# Patient Record
Sex: Female | Born: 1955 | Race: Black or African American | Hispanic: No | Marital: Married | Smoking: Former smoker
Health system: Southern US, Community
[De-identification: ages and names within clinical notes are randomized; demographics above are authoritative.]

## PROBLEM LIST (undated history)

## (undated) DIAGNOSIS — K56609 Unspecified intestinal obstruction, unspecified as to partial versus complete obstruction: Secondary | ICD-10-CM

## (undated) DIAGNOSIS — E785 Hyperlipidemia, unspecified: Secondary | ICD-10-CM

## (undated) DIAGNOSIS — R269 Unspecified abnormalities of gait and mobility: Secondary | ICD-10-CM

## (undated) DIAGNOSIS — R5383 Other fatigue: Secondary | ICD-10-CM

## (undated) DIAGNOSIS — M2550 Pain in unspecified joint: Secondary | ICD-10-CM

## (undated) DIAGNOSIS — K589 Irritable bowel syndrome without diarrhea: Secondary | ICD-10-CM

## (undated) DIAGNOSIS — Z9889 Other specified postprocedural states: Secondary | ICD-10-CM

## (undated) DIAGNOSIS — J45909 Unspecified asthma, uncomplicated: Secondary | ICD-10-CM

## (undated) DIAGNOSIS — E119 Type 2 diabetes mellitus without complications: Secondary | ICD-10-CM

## (undated) DIAGNOSIS — K5792 Diverticulitis of intestine, part unspecified, without perforation or abscess without bleeding: Secondary | ICD-10-CM

## (undated) DIAGNOSIS — G35 Multiple sclerosis: Secondary | ICD-10-CM

## (undated) DIAGNOSIS — E0590 Thyrotoxicosis, unspecified without thyrotoxic crisis or storm: Secondary | ICD-10-CM

## (undated) DIAGNOSIS — G250 Essential tremor: Secondary | ICD-10-CM

## (undated) DIAGNOSIS — K279 Peptic ulcer, site unspecified, unspecified as acute or chronic, without hemorrhage or perforation: Secondary | ICD-10-CM

## (undated) DIAGNOSIS — H539 Unspecified visual disturbance: Secondary | ICD-10-CM

## (undated) DIAGNOSIS — M3500 Sicca syndrome, unspecified: Secondary | ICD-10-CM

## (undated) DIAGNOSIS — R200 Anesthesia of skin: Secondary | ICD-10-CM

## (undated) DIAGNOSIS — E039 Hypothyroidism, unspecified: Secondary | ICD-10-CM

## (undated) DIAGNOSIS — R3911 Hesitancy of micturition: Secondary | ICD-10-CM

## (undated) DIAGNOSIS — K8020 Calculus of gallbladder without cholecystitis without obstruction: Secondary | ICD-10-CM

## (undated) DIAGNOSIS — M1990 Unspecified osteoarthritis, unspecified site: Secondary | ICD-10-CM

## (undated) DIAGNOSIS — J189 Pneumonia, unspecified organism: Secondary | ICD-10-CM

## (undated) DIAGNOSIS — I10 Essential (primary) hypertension: Secondary | ICD-10-CM

## (undated) DIAGNOSIS — M797 Fibromyalgia: Secondary | ICD-10-CM

## (undated) DIAGNOSIS — G4710 Hypersomnia, unspecified: Secondary | ICD-10-CM

## (undated) DIAGNOSIS — R112 Nausea with vomiting, unspecified: Secondary | ICD-10-CM

## (undated) DIAGNOSIS — G4700 Insomnia, unspecified: Secondary | ICD-10-CM

## (undated) DIAGNOSIS — F418 Other specified anxiety disorders: Secondary | ICD-10-CM

## (undated) DIAGNOSIS — R9431 Abnormal electrocardiogram [ECG] [EKG]: Secondary | ICD-10-CM

## (undated) DIAGNOSIS — I499 Cardiac arrhythmia, unspecified: Secondary | ICD-10-CM

## (undated) DIAGNOSIS — K219 Gastro-esophageal reflux disease without esophagitis: Secondary | ICD-10-CM

## (undated) DIAGNOSIS — G43909 Migraine, unspecified, not intractable, without status migrainosus: Secondary | ICD-10-CM

## (undated) HISTORY — DX: Hyperlipidemia, unspecified: E78.5

## (undated) HISTORY — PX: MENISCUS REPAIR: SHX5179

## (undated) HISTORY — PX: CARDIAC CATHETERIZATION: SHX172

## (undated) HISTORY — PX: SMALL INTESTINE SURGERY: SHX150

## (undated) HISTORY — DX: Gastro-esophageal reflux disease without esophagitis: K21.9

## (undated) HISTORY — DX: Unspecified osteoarthritis, unspecified site: M19.90

## (undated) HISTORY — DX: Sjogren syndrome, unspecified: M35.00

## (undated) HISTORY — DX: Abnormal electrocardiogram (ECG) (EKG): R94.31

## (undated) HISTORY — PX: CHOLECYSTECTOMY: SHX55

## (undated) HISTORY — DX: Unspecified visual disturbance: H53.9

## (undated) HISTORY — DX: Peptic ulcer, site unspecified, unspecified as acute or chronic, without hemorrhage or perforation: K27.9

## (undated) HISTORY — DX: Essential tremor: G25.0

## (undated) HISTORY — DX: Migraine, unspecified, not intractable, without status migrainosus: G43.909

## (undated) HISTORY — DX: Hypothyroidism, unspecified: E03.9

## (undated) HISTORY — DX: Irritable bowel syndrome, unspecified: K58.9

## (undated) HISTORY — PX: HERNIA REPAIR: SHX51

## (undated) HISTORY — DX: Unspecified asthma, uncomplicated: J45.909

## (undated) HISTORY — DX: Pneumonia, unspecified organism: J18.9

## (undated) HISTORY — DX: Calculus of gallbladder without cholecystitis without obstruction: K80.20

## (undated) HISTORY — DX: Other specified anxiety disorders: F41.8

## (undated) HISTORY — DX: Thyrotoxicosis, unspecified without thyrotoxic crisis or storm: E05.90

## (undated) HISTORY — DX: Hesitancy of micturition: R39.11

## (undated) HISTORY — DX: Unspecified abnormalities of gait and mobility: R26.9

## (undated) HISTORY — PX: COLON SURGERY: SHX602

## (undated) HISTORY — DX: Hypersomnia, unspecified: G47.10

## (undated) HISTORY — DX: Pain in unspecified joint: M25.50

## (undated) HISTORY — DX: Insomnia, unspecified: G47.00

## (undated) HISTORY — DX: Anesthesia of skin: R20.0

## (undated) HISTORY — DX: Type 2 diabetes mellitus without complications: E11.9

## (undated) HISTORY — DX: Other fatigue: R53.83

## (undated) HISTORY — DX: Diverticulitis of intestine, part unspecified, without perforation or abscess without bleeding: K57.92

## (undated) HISTORY — DX: Unspecified intestinal obstruction, unspecified as to partial versus complete obstruction: K56.609

## (undated) HISTORY — PX: CARPAL TUNNEL RELEASE: SHX101

---
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
## 1974-03-03 HISTORY — PX: ABDOMINAL HYSTERECTOMY: SHX81

## 2011-09-01 DIAGNOSIS — G35 Multiple sclerosis: Secondary | ICD-10-CM | POA: Insufficient documentation

## 2011-09-01 DIAGNOSIS — E119 Type 2 diabetes mellitus without complications: Secondary | ICD-10-CM | POA: Insufficient documentation

## 2011-09-01 DIAGNOSIS — G20 Parkinson's disease: Secondary | ICD-10-CM | POA: Insufficient documentation

## 2011-09-01 DIAGNOSIS — I2510 Atherosclerotic heart disease of native coronary artery without angina pectoris: Secondary | ICD-10-CM | POA: Insufficient documentation

## 2011-09-01 DIAGNOSIS — G8929 Other chronic pain: Secondary | ICD-10-CM | POA: Insufficient documentation

## 2011-09-01 DIAGNOSIS — I10 Essential (primary) hypertension: Secondary | ICD-10-CM | POA: Insufficient documentation

## 2013-08-23 DIAGNOSIS — R110 Nausea: Secondary | ICD-10-CM | POA: Diagnosis present

## 2013-08-23 DIAGNOSIS — R509 Fever, unspecified: Secondary | ICD-10-CM | POA: Diagnosis not present

## 2013-08-23 DIAGNOSIS — I10 Essential (primary) hypertension: Secondary | ICD-10-CM | POA: Diagnosis present

## 2013-08-23 DIAGNOSIS — E872 Acidosis, unspecified: Secondary | ICD-10-CM | POA: Diagnosis present

## 2013-08-23 DIAGNOSIS — K219 Gastro-esophageal reflux disease without esophagitis: Secondary | ICD-10-CM | POA: Diagnosis present

## 2013-08-23 DIAGNOSIS — G35 Multiple sclerosis: Secondary | ICD-10-CM | POA: Diagnosis present

## 2013-08-23 DIAGNOSIS — R6510 Systemic inflammatory response syndrome (SIRS) of non-infectious origin without acute organ dysfunction: Secondary | ICD-10-CM | POA: Diagnosis not present

## 2013-08-23 DIAGNOSIS — E860 Dehydration: Secondary | ICD-10-CM | POA: Diagnosis present

## 2013-08-23 DIAGNOSIS — F411 Generalized anxiety disorder: Secondary | ICD-10-CM | POA: Diagnosis present

## 2013-08-23 DIAGNOSIS — D696 Thrombocytopenia, unspecified: Secondary | ICD-10-CM | POA: Diagnosis present

## 2013-08-23 DIAGNOSIS — IMO0001 Reserved for inherently not codable concepts without codable children: Secondary | ICD-10-CM | POA: Diagnosis not present

## 2013-08-23 DIAGNOSIS — R7401 Elevation of levels of liver transaminase levels: Secondary | ICD-10-CM | POA: Diagnosis present

## 2013-08-23 DIAGNOSIS — R21 Rash and other nonspecific skin eruption: Secondary | ICD-10-CM | POA: Diagnosis present

## 2013-08-23 DIAGNOSIS — Z794 Long term (current) use of insulin: Secondary | ICD-10-CM | POA: Diagnosis not present

## 2013-08-23 DIAGNOSIS — Z8711 Personal history of peptic ulcer disease: Secondary | ICD-10-CM | POA: Diagnosis not present

## 2013-08-23 DIAGNOSIS — R51 Headache: Secondary | ICD-10-CM | POA: Diagnosis present

## 2014-10-10 DIAGNOSIS — H6123 Impacted cerumen, bilateral: Secondary | ICD-10-CM | POA: Diagnosis not present

## 2014-10-10 DIAGNOSIS — F419 Anxiety disorder, unspecified: Secondary | ICD-10-CM | POA: Diagnosis not present

## 2014-10-10 DIAGNOSIS — D519 Vitamin B12 deficiency anemia, unspecified: Secondary | ICD-10-CM | POA: Diagnosis not present

## 2014-10-12 DIAGNOSIS — Z23 Encounter for immunization: Secondary | ICD-10-CM | POA: Diagnosis not present

## 2014-10-22 DIAGNOSIS — L02214 Cutaneous abscess of groin: Secondary | ICD-10-CM | POA: Diagnosis not present

## 2015-01-09 DIAGNOSIS — G35 Multiple sclerosis: Secondary | ICD-10-CM | POA: Diagnosis not present

## 2015-01-09 DIAGNOSIS — F338 Other recurrent depressive disorders: Secondary | ICD-10-CM | POA: Diagnosis not present

## 2015-01-09 DIAGNOSIS — M797 Fibromyalgia: Secondary | ICD-10-CM | POA: Diagnosis not present

## 2015-01-09 DIAGNOSIS — E119 Type 2 diabetes mellitus without complications: Secondary | ICD-10-CM | POA: Diagnosis not present

## 2015-01-09 DIAGNOSIS — E083393 Diabetes mellitus due to underlying condition with moderate nonproliferative diabetic retinopathy without macular edema, bilateral: Secondary | ICD-10-CM | POA: Diagnosis not present

## 2015-01-09 DIAGNOSIS — L630 Alopecia (capitis) totalis: Secondary | ICD-10-CM | POA: Diagnosis not present

## 2015-01-09 DIAGNOSIS — I10 Essential (primary) hypertension: Secondary | ICD-10-CM | POA: Diagnosis not present

## 2015-01-09 DIAGNOSIS — F064 Anxiety disorder due to known physiological condition: Secondary | ICD-10-CM | POA: Diagnosis not present

## 2015-01-17 DIAGNOSIS — I10 Essential (primary) hypertension: Secondary | ICD-10-CM | POA: Diagnosis not present

## 2015-01-17 DIAGNOSIS — M797 Fibromyalgia: Secondary | ICD-10-CM | POA: Diagnosis not present

## 2015-01-17 DIAGNOSIS — G35 Multiple sclerosis: Secondary | ICD-10-CM | POA: Diagnosis not present

## 2015-01-17 DIAGNOSIS — F338 Other recurrent depressive disorders: Secondary | ICD-10-CM | POA: Diagnosis not present

## 2015-02-01 ENCOUNTER — Emergency Department (HOSPITAL_COMMUNITY)
Admission: EM | Admit: 2015-02-01 | Discharge: 2015-02-01 | Disposition: A | Discharge status: Home / Self Care | Payer: Self-pay

## 2015-02-01 NOTE — ED Notes (Signed)
Pt checked in, asked if the wait time was accurate that was displayed on the screen. When told time was accurate, pt stated that she did not want to wait that long. Pt then left. Pt moved to off the floor at this time.

## 2015-02-03 ENCOUNTER — Encounter (HOSPITAL_COMMUNITY): Payer: Self-pay | Admitting: Emergency Medicine

## 2015-02-03 ENCOUNTER — Emergency Department (HOSPITAL_COMMUNITY)
Admission: EM | Admit: 2015-02-03 | Discharge: 2015-02-03 | Disposition: A | Discharge status: Home / Self Care | Payer: Medicare Other | Attending: Physician Assistant | Admitting: Physician Assistant

## 2015-02-03 DIAGNOSIS — E119 Type 2 diabetes mellitus without complications: Secondary | ICD-10-CM | POA: Diagnosis not present

## 2015-02-03 DIAGNOSIS — Z8669 Personal history of other diseases of the nervous system and sense organs: Secondary | ICD-10-CM

## 2015-02-03 DIAGNOSIS — R202 Paresthesia of skin: Secondary | ICD-10-CM | POA: Diagnosis not present

## 2015-02-03 DIAGNOSIS — I10 Essential (primary) hypertension: Secondary | ICD-10-CM | POA: Insufficient documentation

## 2015-02-03 DIAGNOSIS — G8929 Other chronic pain: Secondary | ICD-10-CM | POA: Insufficient documentation

## 2015-02-03 DIAGNOSIS — R531 Weakness: Secondary | ICD-10-CM | POA: Insufficient documentation

## 2015-02-03 DIAGNOSIS — H538 Other visual disturbances: Secondary | ICD-10-CM | POA: Diagnosis not present

## 2015-02-03 DIAGNOSIS — Z79899 Other long term (current) drug therapy: Secondary | ICD-10-CM | POA: Diagnosis not present

## 2015-02-03 DIAGNOSIS — Z794 Long term (current) use of insulin: Secondary | ICD-10-CM | POA: Diagnosis not present

## 2015-02-03 DIAGNOSIS — M545 Low back pain: Secondary | ICD-10-CM

## 2015-02-03 DIAGNOSIS — R200 Anesthesia of skin: Secondary | ICD-10-CM | POA: Diagnosis not present

## 2015-02-03 DIAGNOSIS — R51 Headache: Secondary | ICD-10-CM | POA: Insufficient documentation

## 2015-02-03 DIAGNOSIS — G35 Multiple sclerosis: Secondary | ICD-10-CM

## 2015-02-03 HISTORY — DX: Fibromyalgia: M79.7

## 2015-02-03 HISTORY — DX: Essential (primary) hypertension: I10

## 2015-02-03 HISTORY — DX: Multiple sclerosis: G35

## 2015-02-03 LAB — BASIC METABOLIC PANEL
ANION GAP: 8 (ref 5–15)
BUN: 13 mg/dL (ref 6–20)
CALCIUM: 9.4 mg/dL (ref 8.9–10.3)
CHLORIDE: 105 mmol/L (ref 101–111)
CO2: 27 mmol/L (ref 22–32)
Creatinine, Ser: 0.97 mg/dL (ref 0.44–1.00)
GFR calc non Af Amer: >60 mL/min (ref >60)
GLUCOSE: 117 mg/dL — AB (ref 65–99)
POTASSIUM: 4.1 mmol/L (ref 3.5–5.1)
Sodium: 140 mmol/L (ref 135–145)

## 2015-02-03 LAB — CBC WITH DIFFERENTIAL/PLATELET
BASOS ABS: 0 10*3/uL (ref 0.0–0.1)
BASOS PCT: 0 %
Eosinophils Absolute: 0 10*3/uL (ref 0.0–0.7)
Eosinophils Relative: 0 %
HEMATOCRIT: 35.8 % — AB (ref 36.0–46.0)
HEMOGLOBIN: 11.9 g/dL — AB (ref 12.0–15.0)
LYMPHS PCT: 56 %
Lymphs Abs: 2.4 10*3/uL (ref 0.7–4.0)
MCH: 28.6 pg (ref 26.0–34.0)
MCHC: 33.2 g/dL (ref 30.0–36.0)
MCV: 86.1 fL (ref 78.0–100.0)
MONO ABS: 0.3 10*3/uL (ref 0.1–1.0)
Monocytes Relative: 8 %
NEUTROS ABS: 1.5 10*3/uL — AB (ref 1.7–7.7)
NEUTROS PCT: 36 %
Platelets: 147 10*3/uL — ABNORMAL LOW (ref 150–400)
RBC: 4.16 MIL/uL (ref 3.87–5.11)
RDW: 12.3 % (ref 11.5–15.5)
WBC: 4.2 10*3/uL (ref 4.0–10.5)

## 2015-02-03 MED ORDER — HYDROMORPHONE HCL 1 MG/ML IJ SOLN: 1.0000 mg | Freq: Once | INTRAMUSCULAR | Status: DC

## 2015-02-03 MED ORDER — OXYCODONE-ACETAMINOPHEN 5-325 MG PO TABS: 2.0000 | ORAL_TABLET | ORAL | Status: DC | PRN

## 2015-02-03 MED ORDER — HYDROMORPHONE HCL 1 MG/ML IJ SOLN
1.0000 mg | Freq: Once | INTRAMUSCULAR | Status: AC
Start: 2015-02-03 — End: 2015-02-03
  Administered 2015-02-03: 1 mg via INTRAMUSCULAR
  Filled 2015-02-03: qty 1

## 2015-02-03 NOTE — ED Notes (Signed)
Moved to room 34, report to [place].

## 2015-02-03 NOTE — ED Notes (Signed)
LBP x 2 weeks

## 2015-02-03 NOTE — Discharge Instructions (Signed)
Take your medications as prescribed as needed for pain relief. Follow up with neurology at your scheduled appointment next week. Please return to the Emergency Department if symptoms worsen or new onset of fever, neck stiffness, groin numbness, weakness, abdominal pain, vomiting, loss control of bowel or bladder.

## 2015-02-03 NOTE — ED Provider Notes (Signed)
CSN: [id number]     Arrival date & time 02/03/15  1032 History   First MD Initiated Contact with Patient 02/03/15 1058     Chief Complaint  Patient presents with  . Back Pain     (Consider location/radiation/quality/duration/timing/severity/associated sxs/prior Treatment) HPI   Patient is a 59 year old female with past medical history of MS, hypertension, fibromyalgia and diabetes who presents to the ED with complaint of worsening back pain. Patient reports having chronic back pain associated with her MS. She notes she recently moved from [place] and has an appointment scheduled with a neurologist on 12/9. Patient reports having sharp intermittent pain that starts in her lower back and radiates bilaterally down both posterior thighs and also notes the pain radiates superiorly up both sides of her back, pain aggravated with movement. She notes she has been taking Vicodin at home with no relief. Patient reports pain is consistent with back pain she has had in the past related to her MS but notes that it has worsened over the past few months. Endorses associated numbness and tingling to her entire body. She notes having intermittent episodes of burning sensation to her lower legs. Patient also reports she has been much weaker than baseline and reports having multiple mechanical falls over the past few months, denies head injury or LOC. Pt denies fever, saddle anesthesia, loss of bowel or bladder, urinary sxs, IVDU, cancer or recent spinal manipulation. Patient reports she was on Avonex however due to side effects she stopped taking the medication approximately one week ago and notes she feels like her symptoms have worsened since stopping the medication.  Past Medical History  Diagnosis Date  . MS (multiple sclerosis) ([place])   . Fibromyalgia   . Diabetes mellitus without complication ([place])   . Hypertension    Past Surgical History  Procedure Laterality Date  . Colon surgery    .  Cholecystectomy    . Abdominal hysterectomy    . Ctr    . Knee surgery    . Appendectomy     No family history on file. Social History  Substance Use Topics  . Smoking status: Never Smoker   . Smokeless tobacco: None  . Alcohol Use: No   OB History    No data available     Review of Systems  Eyes: Positive for visual disturbance. Photophobia: blurred vision.  Musculoskeletal: Positive for myalgias and back pain.  Neurological: Positive for weakness, numbness and headaches.  All other systems reviewed and are negative.     Allergies  Prednisone; Solu-medrol; Bactrim; and Biaxin  Home Medications   Prior to Admission medications   Medication Sig Start Date End Date Taking? Authorizing Provider  alprazolam Duanne Moron) 2 MG tablet Take 2 mg by mouth 3 (three) times daily.   Yes Historical Provider, MD  Cyanocobalamin (VITAMIN B-12 IJ) Inject 1 mL as directed every 14 (fourteen) days.   Yes Historical Provider, MD  HYDROcodone-acetaminophen (NORCO) 10-325 MG tablet Take 1 tablet by mouth every 6 (six) hours.   Yes Historical Provider, MD  insulin aspart protamine- aspart (NOVOLOG MIX 70/30) (70-30) 100 UNIT/ML injection Inject 26-30 Units into the skin See admin instructions. 26 units in am, 30 units in pm   Yes Historical Provider, MD  lidocaine (XYLOCAINE) 5 % ointment Apply 1 application topically daily as needed for mild pain.   Yes Historical Provider, MD  losartan (COZAAR) 50 MG tablet Take 50 mg by mouth daily.   Yes Historical Provider, MD  pantoprazole (PROTONIX) 40 MG tablet Take 40 mg by mouth daily.   Yes Historical Provider, MD  oxyCODONE-acetaminophen (PERCOCET/ROXICET) 5-325 MG tablet Take 2 tablets by mouth every 4 (four) hours as needed for severe pain. 02/03/15   Chesley Noon [place], PA-C   BP 137/88 mmHg  Pulse 88  Temp(Src) 98.2 F (36.8 C) (Oral)  Resp 18  Ht 5\' 3"  (1.6 m)  Wt 75.796 kg  BMI 29.61 kg/m2  SpO2 100% Physical Exam  Constitutional: She  is oriented to person, place, and time. She appears well-developed and well-nourished. No distress.  HENT:  Head: Normocephalic and atraumatic.  Mouth/Throat: Oropharynx is clear and moist. No oropharyngeal exudate.  Eyes: Conjunctivae and EOM are normal. Pupils are equal, round, and reactive to light. Right eye exhibits no discharge. Left eye exhibits no discharge. No scleral icterus.  Neck: Normal range of motion. Neck supple.  Cardiovascular: Normal rate, regular rhythm, normal heart sounds and intact distal pulses.   Pulmonary/Chest: Effort normal and breath sounds normal. No respiratory distress. She has no wheezes. She has no rales. She exhibits no tenderness.  Abdominal: Soft. Bowel sounds are normal. She exhibits no distension and no mass. There is no tenderness. There is no rebound and no guarding.  Genitourinary: Rectum normal. Rectal exam shows anal tone normal.  Musculoskeletal: She exhibits tenderness. She exhibits no edema.  Midline C/T/L spine tenderness to palpation. Bilateral cervical, thoracic and lumbar paraspinal muscles tender with light palpation. Patient reports she is unable to flex her back during exam however patient is able to flex her back in order to sit up in bed without any assistance. Bilateral positive straight leg raise. 5/5 strength of BUE and BLE. 2+ radial and PT pulses bilaterally.    Lymphadenopathy:    She has no cervical adenopathy.  Neurological: She is alert and oriented to person, place, and time. She has normal strength. A sensory deficit (Pt reports decreased sensation to left lower extremity.) is present. No cranial nerve deficit. Coordination normal.  Skin: Skin is warm and dry. She is not diaphoretic.  Nursing note and vitals reviewed.   ED Course  Procedures (including critical care time) Labs Review Labs Reviewed  CBC WITH DIFFERENTIAL/PLATELET - Abnormal; Notable for the following:    Hemoglobin 11.9 (*)    HCT 35.8 (*)    Platelets 147  (*)    Neutro Abs 1.5 (*)    All other components within normal limits  BASIC METABOLIC PANEL - Abnormal; Notable for the following:    Glucose, Bld 117 (*)    All other components within normal limits    Imaging Review No results found. I have personally reviewed and evaluated these images and lab results as part of my medical decision-making.  Filed Vitals:   02/03/15 1415 02/03/15 1515  BP: 142/90 137/88  Pulse: 67 88  Temp:    Resp:       MDM   Final diagnoses:  Bilateral low back pain, with sciatica presence unspecified  Hx of multiple sclerosis    Pt presents with multiple complaints. Reports worsening chronic lower back pain, numbness, tingling, weakness, headache, blurred vision for the past few months. History of MS, patient reports these symptoms are consistent with her MS flares. Endorses recent mechanical falls, denies head injury or LOC. VSS. Exam revealed diffuse tenderness throughout back exam with light palpation. Pt reports she is unable to bend/flex her back however during interview pt is able to sit up in bed without assistance or  evidence of pain. Normal rectal tone. Patient able to urinate in the ED, no evidence of urinary incontinence. Labs unremarkable. I do not suspect cord compression or cauda equina syndrome at this time. I suspect patient's symptoms are likely due to to chronic pain associated with MS. Patient reports she recently moved from [place] but states that she has an appointment scheduled with a neurologist for next week. Plan to discharge patient home with short course of pain meds. Advised patient to follow up with neurology at her scheduled appointment.  Evaluation does not show pathology requring ongoing emergent intervention or admission. Pt is hemodynamically stable and mentating appropriately. Discussed findings/results and plan with patient/guardian, who agrees with plan. All questions answered. Return precautions discussed and outpatient  follow up given.        Chesley Noon [place], [place] 02/03/15 [place], MD 02/04/15 [phone]

## 2015-02-03 NOTE — ED Provider Notes (Signed)
MSE was initiated and I personally evaluated the patient and placed orders (if any) at  11:33 AM on February 03, 2015.  Patient with h/o multiple medical problems including multiple sclerosis, fibromyalgia, small bowel obstruction -- presents with worsening of baseline chronic lower back pain with paresthesias in her lower legs described as burning, hot/cold sensation. She has been much weaker than baseline recently has had multiple falls. She reports urgency with having bowel movements and wonders if this is related to her small bowel obstruction. She recently moved to the area approximately one month ago. She has a PCP who is referring her to multiple specialties for her multiple problems.  Labs ordered, patient to move from pod F to acute ED for further evaluation and workup.  Exam:  Gen NAD; Heart RRR, nml S1,S2, no m/r/g; Lungs CTAB; Abd soft, NT, no rebound or guarding; Neuro patient is unable to lift her legs off the bed against gravity (question effort).  The patient appears stable so that the remainder of the MSE may be completed by another provider.  BP 149/90 mmHg  Pulse 71  Temp(Src) 98.2 F (36.8 C) (Oral)  Resp 18  Ht 5\' 3"  (1.6 m)  Wt 75.796 kg  BMI 29.61 kg/m2  SpO2 100%   Carlisle Cater, PA-C 02/03/15 [place], MD 02/04/15 0830

## 2015-02-03 NOTE — ED Notes (Signed)
Pt to ED for c/o back pain. Hx of MS, IDDM,HTN. Hx of frequent falls. Just moved from [place]. Seeing Dr. Clayburn Pert and has neurology appointment next week. Pt has multiple sources of pain. Screened by PA, ordered to move to acute bed for further evaluation.

## 2015-02-08 DIAGNOSIS — G35 Multiple sclerosis: Secondary | ICD-10-CM | POA: Diagnosis not present

## 2015-02-08 DIAGNOSIS — I10 Essential (primary) hypertension: Secondary | ICD-10-CM | POA: Diagnosis not present

## 2015-02-08 DIAGNOSIS — E118 Type 2 diabetes mellitus with unspecified complications: Secondary | ICD-10-CM | POA: Diagnosis not present

## 2015-02-08 DIAGNOSIS — M797 Fibromyalgia: Secondary | ICD-10-CM | POA: Diagnosis not present

## 2015-02-09 ENCOUNTER — Ambulatory Visit (INDEPENDENT_AMBULATORY_CARE_PROVIDER_SITE_OTHER): Payer: Medicare Other | Admitting: Neurology

## 2015-02-09 ENCOUNTER — Encounter: Payer: Self-pay | Admitting: Neurology

## 2015-02-09 ENCOUNTER — Telehealth: Payer: Self-pay | Admitting: Neurology

## 2015-02-09 VITALS — BP 146/90 | HR 68 | Resp 16 | Ht 62.0 in | Wt 167.0 lb

## 2015-02-09 DIAGNOSIS — R3911 Hesitancy of micturition: Secondary | ICD-10-CM | POA: Diagnosis not present

## 2015-02-09 DIAGNOSIS — M797 Fibromyalgia: Secondary | ICD-10-CM | POA: Diagnosis not present

## 2015-02-09 DIAGNOSIS — F418 Other specified anxiety disorders: Secondary | ICD-10-CM | POA: Insufficient documentation

## 2015-02-09 DIAGNOSIS — R5383 Other fatigue: Secondary | ICD-10-CM | POA: Insufficient documentation

## 2015-02-09 DIAGNOSIS — R200 Anesthesia of skin: Secondary | ICD-10-CM

## 2015-02-09 DIAGNOSIS — G35 Multiple sclerosis: Secondary | ICD-10-CM | POA: Diagnosis not present

## 2015-02-09 DIAGNOSIS — R269 Unspecified abnormalities of gait and mobility: Secondary | ICD-10-CM | POA: Insufficient documentation

## 2015-02-09 DIAGNOSIS — G8929 Other chronic pain: Secondary | ICD-10-CM | POA: Insufficient documentation

## 2015-02-09 DIAGNOSIS — G4700 Insomnia, unspecified: Secondary | ICD-10-CM | POA: Insufficient documentation

## 2015-02-09 DIAGNOSIS — F32A Depression, unspecified: Secondary | ICD-10-CM | POA: Insufficient documentation

## 2015-02-09 DIAGNOSIS — F411 Generalized anxiety disorder: Secondary | ICD-10-CM | POA: Insufficient documentation

## 2015-02-09 HISTORY — DX: Hesitancy of micturition: R39.11

## 2015-02-09 HISTORY — DX: Other fatigue: R53.83

## 2015-02-09 HISTORY — DX: Insomnia, unspecified: G47.00

## 2015-02-09 HISTORY — DX: Unspecified abnormalities of gait and mobility: R26.9

## 2015-02-09 HISTORY — DX: Other specified anxiety disorders: F41.8

## 2015-02-09 MED ORDER — TRAZODONE HCL 100 MG PO TABS: 100.0000 mg | ORAL_TABLET | Freq: Every day | ORAL | Status: DC

## 2015-02-09 MED ORDER — PREGABALIN 150 MG PO CAPS: 150.0000 mg | ORAL_CAPSULE | Freq: Two times a day (BID) | ORAL | Status: DC

## 2015-02-09 NOTE — Telephone Encounter (Signed)
I have spoken with Gina and advised that, per RAS ok, Trazodone rx. has been sent to [facility]/fim

## 2015-02-09 NOTE — Telephone Encounter (Signed)
Patient would like all her Rx through [facility] - except for the one Dr. Felecia Shelling gave her today. Her best number is [phone].

## 2015-02-09 NOTE — Progress Notes (Signed)
[facility]  PATIENT: Courtney [name] DOB: 14-Sep-1955  REFERRING DOCTOR OR PCP:  Lucianne Lei SOURCE: Patient, MRI images on CD, MRI reports.  _________________________________   HISTORICAL  CHIEF COMPLAINT:  Chief Complaint  Patient presents with  . Multiple Sclerosis    [name] is here alone for eval of MS.  Sts. she was dx. around 2006.  Presenting sx. were hot sensations random parts of her body, gait/balance disturbance, numbness in legs/arms/face, h/a's. Sts. she was living in [place], [place], and was seen at [facility] in [place], and [facility] in [place], [place], and [facility] in [place], [facility] in [place], [place].  Sts. she was seen by neurologist Dr. Mart Piggs at [facility] in [place], [place]  . Extremity Weakness    phone # [phone].  Sts. dx. confirmed with MRI and LP.   She started Copaxone but stopped this after about 6 mos. due to inj. site rxn's.  She then transferred care to Dr. Darnell Level Lipsius in [place], [place] phone # [phone].  He started her on Plegridy but she stopped this after 3-4 mos. due to inj. site rxns.  She was off of all MS meds for several yrs.  Around 2014-2015 she started Avonex, and just stopped it last week due to side effects--flu-like sx.  Sts. around 2007-2008 she was also dx.   . Tremors    with Parkinson's Disease after she presented with tremors in hands, continued numbness in legs, face.  She is ambulatory with a cane.  Sts. left leg is weaker than right leg, but numbness is worse in right leg. She just moved to the [place] area a month ago./fim  . Pain    Sts. is trying to get into a pain mx. center for chronic back/neck pain and headaches.  In [place], pain was managed by  [facility] phone # [phone], fax # [phone]    HISTORY OF PRESENT ILLNESS:  I had the pleasure of seeing your patient, Zakira Katara Detzel,  [facility] for neurologic consultation regarding her diagnosis of multiple sclerosis. She reports that she was diagnosed in 2006. She presented with hot sensations in different parts of the body and also noted some trouble with her gait and balance. No medication was started.    She was diagnosed with Parkinson's disease and started on Mirapex.  A few years ago, she had more symptoms with numbness, worsening tremors and falling. More MRIs were performed I reviewed several of the studies. The MRI of the brain 11/24/11 shows a couple tiny T2/FLAIR hyperintense foci in the hemispheres which would be normal for age. The MRIs of the spine 11/24/11 did not show any abnormality within the spinal cord.     She was seen by Dr. Mart Piggs at [facility] in [place] and diagnosed with multiple sclerosis. She has had multiple MRI scans.  She also had a lumbar puncture June 2015. We don't have the results..  She started Copaxone.     She then was placed on Plegridy after she had difficulties with the Copaxone with site reactions. She had injection site reactions with Plegridy as well and has been on Avonex the past year.  Gait/strength/sensation: Currently she has difficulty with her gait. She notes that her legs give out frequently. Sometimes her legs feel like jelly. She has numbness in the right leg and more weakness in the left leg.  Bladder/bowel: She reports some urinary hesitancy and frequency. She has had urinary tract infections. She reports a lot of diarrhea and has had bowel incontinence.  Vision:   She reports that she has difficulty with eye pain and diplopia at times. Also at times, for a few minutes, she will note decreased vision in one or both eyes.  Fatigue/sleep: She reports that she is tired much of the time. He fatigues easily. She also notes that she has insomnia, with difficulty falling asleep and staying asleep.    For insomnia, she has been on Ambien, Lunesta.    She does not think she was on trazodone or doxepin in the past.      She snores but does not have pauses.    A PSG recently showed minimal OSA by her repot.    Mood/cognition: She reports depression and anxiety.  She has been treated with multiple antidepressants but they have not been of much benefit. She also takes Xanax 2 mg 3 times a day for the anxiety.    She reports difficulty with her cognition. Specifically she has problems with short-term memory and verbal fluency.  She reports that she has pain in the head, arms, trunk, legs. She notes that her joints sometimes will stiffen up. This seems to be worse often during the night.    She was diagnosed with fibromyalgia in the past and was placed on Neurontin, Lyrica and Cymbalta at various times.   She felt Lyrica helped slightly but the other medications did not help her much. She is been on multiple muscle relaxants in the past and thinks she was on baclofen, Zanaflex and Flexeril at some point.   She has diabetes and has been on insulin for about 6 years.   REVIEW OF SYSTEMS: Constitutional: No fevers, chills, sweats, or change in appetite.   She has fatigue and poor sleep. Eyes: No visual changes, double vision, eye pain Ear, nose and throat: No hearing loss, ear pain, nasal congestion, sore throat Cardiovascular: No chest pain, palpitations Respiratory: No shortness of breath at rest or with exertion.   No wheezes.  She snores. PSG in the past was reportedly fairly normal GastrointestinaI: No nausea, vomiting, abdominal pain, fecal incontinence.  She reports diarrhea. Genitourinary:   She reports urinary frequency and hesitancy. No nocturia. Musculoskeletal: see above Integumentary: No rash, pruritus, skin lesions Neurological: as above Psychiatric: see above. Endocrine: No palpitations, diaphoresis, change in appetite, change in weigh or increased thirst Hematologic/Lymphatic: No anemia, purpura, petechiae. Allergic/Immunologic: No  itchy/runny eyes, nasal congestion, recent allergic reactions, rashes  ALLERGIES: Allergies  Allergen Reactions  . Prednisone Anaphylaxis, Shortness Of Breath and Palpitations  . Solu-Medrol [Methylprednisolone] Anaphylaxis, Shortness Of Breath and Palpitations  . Bactrim [Sulfamethoxazole-Trimethoprim] Other (See Comments)    "blotchiness" redness, face swelling  . Biaxin [Clarithromycin] Swelling    HOME MEDICATIONS:  Current outpatient prescriptions:  .  alprazolam (XANAX) 2 MG tablet, Take 2 mg by mouth 3 (three) times daily., Disp: , Rfl:  .  Biotin 1000 MCG tablet, Take 1,000 mcg by mouth daily., Disp: , Rfl:  .  Cyanocobalamin (VITAMIN B-12 IJ), Inject 1 mL as directed every 14 (fourteen) days., Disp: , Rfl:  .  HYDROcodone-acetaminophen (NORCO) 10-325 MG tablet, Take 1 tablet by mouth every 6 (six) hours., Disp: , Rfl:  .  insulin aspart protamine- aspart (NOVOLOG MIX 70/30) (70-30) 100 UNIT/ML injection, Inject 26-30 Units into the skin See admin instructions. 26 units in am, 30 units in pm,  Disp: , Rfl:  .  lidocaine (XYLOCAINE) 5 % ointment, Apply 1 application topically daily as needed for mild pain., Disp: , Rfl:  .  losartan (COZAAR) 50 MG tablet, Take 50 mg by mouth daily., Disp: , Rfl:  .  oxyCODONE-acetaminophen (PERCOCET/ROXICET) 5-325 MG tablet, Take 2 tablets by mouth every 4 (four) hours as needed for severe pain. (Patient not taking: Reported on 02/09/2015), Disp: 6 tablet, Rfl: 0 .  pantoprazole (PROTONIX) 40 MG tablet, Take 40 mg by mouth daily., Disp: , Rfl:   PAST MEDICAL HISTORY: Past Medical History  Diagnosis Date  . MS (multiple sclerosis) ([place])   . Fibromyalgia   . Diabetes mellitus without complication ([place])   . Hypertension   . Vision abnormalities     PAST SURGICAL HISTORY: Past Surgical History  Procedure Laterality Date  . Colon surgery    . Cholecystectomy    . Abdominal hysterectomy    . Ctr    . Knee surgery    . Appendectomy       FAMILY HISTORY: Family History  Problem Relation Age of Onset  . Heart disease Mother   . Kidney disease Mother   . Other Father   . Multiple sclerosis Daughter   . Multiple sclerosis Other     SOCIAL HISTORY:  Social History   Social History  . Marital Status: Married    Spouse Name: N/A  . Number of Children: N/A  . Years of Education: N/A   Occupational History  . Not on file.   Social History Main Topics  . Smoking status: Never Smoker   . Smokeless tobacco: Not on file  . Alcohol Use: No  . Drug Use: No  . Sexual Activity: Not on file   Other Topics Concern  . Not on file   Social History Narrative     PHYSICAL EXAM  Filed Vitals:   02/09/15 1028  BP: 146/90  Pulse: 68  Resp: 16  Height: 5\' 2"  (1.575 m)  Weight: 167 lb (75.751 kg)    Body mass index is 30.54 kg/(m^2).   General: The patient is well-developed and well-nourished and in no acute distress  Eyes:  Funduscopic exam shows normal optic discs and retinal vessels.  Neck: The neck is supple, no carotid bruits are noted.  Range of motion is reasonably normal. She is tender in the paraspinal muscles, trapezius and rhomboids  Cardiovascular: The heart has a regular rate and rhythm with a normal S1 and S2. There were no murmurs, gallops or rubs. Lungs are clear to auscultation.  Skin: Extremities are without significant edema.  Musculoskeletal:  She is tender most of the classic fibromyalgia tender points   Neurologic Exam  Mental status: The patient is alert and oriented x 3 at the time of the examination. The patient has apparent normal recent and remote memory, with an apparently normal attention span and concentration ability.   Speech is normal.  Cranial nerves: Extraocular movements are full. Pupils are equal, round, and reactive to light and accomodation.    Facial symmetry is present. There is good facial sensation to soft touch bilaterally.Facial strength is normal.  Trapezius  and sternocleidomastoid strength is normal. No dysarthria is noted.  The tongue is midline, and the patient has symmetric elevation of the soft palate. No obvious hearing deficits are noted.  Motor:  She has a fast tremor in her hands with some distractibility.   Muscle bulk is normal.   Tone is normal. Strength is  5 / 5 in all 4 extremities.   Sensory: She reports decreased sensation to touch and sensation on the left side.  Coordination: Cerebellar testing reveals good finger-nose-finger bilaterally.  Gait and station: Station is normal.   Gait is arthritic. Tandem gait is mildly wide. Romberg is negative.   Reflexes: Deep tendon reflexes are symmetric and normal bilaterally.   Plantar responses are flexor.    DIAGNOSTIC DATA (LABS, IMAGING, TESTING) - I reviewed patient records, labs, notes, testing and imaging myself where available.  Lab Results  Component Value Date   WBC 4.2 02/03/2015   HGB 11.9* 02/03/2015   HCT 35.8* 02/03/2015   MCV 86.1 02/03/2015   PLT 147* 02/03/2015      Component Value Date/Time   NA 140 02/03/2015 1145   K 4.1 02/03/2015 1145   CL 105 02/03/2015 1145   CO2 27 02/03/2015 1145   GLUCOSE 117* 02/03/2015 1145   BUN 13 02/03/2015 1145   CREATININE 0.97 02/03/2015 1145   CALCIUM 9.4 02/03/2015 1145   GFRNONAA >60 02/03/2015 1145   GFRAA >60 02/03/2015 1145       ASSESSMENT AND PLAN  Multiple sclerosis (HCC) - Plan: MR Brain Wo Contrast, MR Cervical Spine Wo Contrast  Gait difficulty - Plan: MR Brain Wo Contrast, MR Cervical Spine Wo Contrast  Fibromyalgia  Depression with anxiety  Numbness  Insomnia  Urinary hesitancy  Other fatigue   In summary, Mrs. Nesiah Hathway is a 59 year old woman who was diagnosed with multiple sclerosis 4 or 5 years ago on Avonex therapy. I personally reviewed MRIs of the spine and the brain and feels that the central nervous system is normal for age therefore, I am uncertain of her diagnosis of MS. She  has many somatic symptoms with pain and numbness all over her body. Additionally she has depression and poor sleep. This combination is much more likely to be due to fibromyalgia than to MS.    She has a tremor that has some distractibility to it. However did seem to worsen with certain activities, more consistent with benign essential tremor. She does not have other evidence of Parkinson's such as bradykinesia or cogwheel rigidity.  To try to help clarify what she might actually have, I will check an MRI of the brain and cervical spine. If there has been no significant changes and they continue to show age appropriate CNS changes, then I will undiagnosed her MS and have her stop the Avonex.    I do think she has fibromyalgia and should be treated. I will add Lyrica 150 mg by mouth twice a day. Trazodone 100 mg will be added nightly to help with sleep.   She did not want to try an antidepressant for her mood disturbances  She will return to see me in a couple months. Nerve third new or worsening neurologic symptoms.  Thank you for asking me to see her for neurologic consultation. Please let me know if I can be of further assistance with her or other patients in the future.  Yeilin Zweber A. Felecia Shelling, MD, PhD [id number], [id number] AM Certified in Neurology, Clinical Neurophysiology, Sleep Medicine, Pain Medicine and Neuroimaging  [facility] [address] [place], [place] [address] [phone]

## 2015-03-05 DIAGNOSIS — E119 Type 2 diabetes mellitus without complications: Secondary | ICD-10-CM | POA: Diagnosis not present

## 2015-03-05 DIAGNOSIS — H2513 Age-related nuclear cataract, bilateral: Secondary | ICD-10-CM | POA: Diagnosis not present

## 2015-03-05 DIAGNOSIS — G35 Multiple sclerosis: Secondary | ICD-10-CM | POA: Diagnosis not present

## 2015-03-05 DIAGNOSIS — H469 Unspecified optic neuritis: Secondary | ICD-10-CM | POA: Diagnosis not present

## 2015-03-05 DIAGNOSIS — H532 Diplopia: Secondary | ICD-10-CM | POA: Diagnosis not present

## 2015-03-13 DIAGNOSIS — M545 Low back pain: Secondary | ICD-10-CM | POA: Diagnosis not present

## 2015-03-13 DIAGNOSIS — M5442 Lumbago with sciatica, left side: Secondary | ICD-10-CM | POA: Diagnosis not present

## 2015-03-13 DIAGNOSIS — G890 Central pain syndrome: Secondary | ICD-10-CM | POA: Diagnosis not present

## 2015-03-13 DIAGNOSIS — M5418 Radiculopathy, sacral and sacrococcygeal region: Secondary | ICD-10-CM | POA: Diagnosis not present

## 2015-03-13 DIAGNOSIS — M5441 Lumbago with sciatica, right side: Secondary | ICD-10-CM | POA: Diagnosis not present

## 2015-03-13 DIAGNOSIS — G894 Chronic pain syndrome: Secondary | ICD-10-CM | POA: Diagnosis not present

## 2015-03-13 DIAGNOSIS — G541 Lumbosacral plexus disorders: Secondary | ICD-10-CM | POA: Diagnosis not present

## 2015-03-13 DIAGNOSIS — M5417 Radiculopathy, lumbosacral region: Secondary | ICD-10-CM | POA: Diagnosis not present

## 2015-03-13 DIAGNOSIS — G603 Idiopathic progressive neuropathy: Secondary | ICD-10-CM | POA: Diagnosis not present

## 2015-03-21 DIAGNOSIS — M6281 Muscle weakness (generalized): Secondary | ICD-10-CM | POA: Diagnosis not present

## 2015-03-21 DIAGNOSIS — M545 Low back pain: Secondary | ICD-10-CM | POA: Diagnosis not present

## 2015-03-27 DIAGNOSIS — M545 Low back pain: Secondary | ICD-10-CM | POA: Diagnosis not present

## 2015-03-27 DIAGNOSIS — M6281 Muscle weakness (generalized): Secondary | ICD-10-CM | POA: Diagnosis not present

## 2015-03-27 DIAGNOSIS — L700 Acne vulgaris: Secondary | ICD-10-CM | POA: Diagnosis not present

## 2015-03-27 DIAGNOSIS — L821 Other seborrheic keratosis: Secondary | ICD-10-CM | POA: Diagnosis not present

## 2015-03-27 DIAGNOSIS — L219 Seborrheic dermatitis, unspecified: Secondary | ICD-10-CM | POA: Diagnosis not present

## 2015-03-27 DIAGNOSIS — L669 Cicatricial alopecia, unspecified: Secondary | ICD-10-CM | POA: Diagnosis not present

## 2015-03-30 DIAGNOSIS — M545 Low back pain: Secondary | ICD-10-CM | POA: Diagnosis not present

## 2015-03-30 DIAGNOSIS — M6281 Muscle weakness (generalized): Secondary | ICD-10-CM | POA: Diagnosis not present

## 2015-04-03 DIAGNOSIS — M545 Low back pain: Secondary | ICD-10-CM | POA: Diagnosis not present

## 2015-04-03 DIAGNOSIS — M6281 Muscle weakness (generalized): Secondary | ICD-10-CM | POA: Diagnosis not present

## 2015-04-04 DIAGNOSIS — E118 Type 2 diabetes mellitus with unspecified complications: Secondary | ICD-10-CM | POA: Diagnosis not present

## 2015-04-04 DIAGNOSIS — M797 Fibromyalgia: Secondary | ICD-10-CM | POA: Diagnosis not present

## 2015-04-05 DIAGNOSIS — G35 Multiple sclerosis: Secondary | ICD-10-CM | POA: Diagnosis not present

## 2015-04-05 DIAGNOSIS — G890 Central pain syndrome: Secondary | ICD-10-CM | POA: Diagnosis not present

## 2015-04-05 DIAGNOSIS — G894 Chronic pain syndrome: Secondary | ICD-10-CM | POA: Diagnosis not present

## 2015-04-05 DIAGNOSIS — M7071 Other bursitis of hip, right hip: Secondary | ICD-10-CM | POA: Diagnosis not present

## 2015-04-05 DIAGNOSIS — M5442 Lumbago with sciatica, left side: Secondary | ICD-10-CM | POA: Diagnosis not present

## 2015-04-05 DIAGNOSIS — M5441 Lumbago with sciatica, right side: Secondary | ICD-10-CM | POA: Diagnosis not present

## 2015-04-05 DIAGNOSIS — M25551 Pain in right hip: Secondary | ICD-10-CM | POA: Diagnosis not present

## 2015-04-05 DIAGNOSIS — M6281 Muscle weakness (generalized): Secondary | ICD-10-CM | POA: Diagnosis not present

## 2015-04-05 DIAGNOSIS — M5411 Radiculopathy, occipito-atlanto-axial region: Secondary | ICD-10-CM | POA: Diagnosis not present

## 2015-04-05 DIAGNOSIS — M545 Low back pain: Secondary | ICD-10-CM | POA: Diagnosis not present

## 2015-04-13 ENCOUNTER — Other Ambulatory Visit (HOSPITAL_COMMUNITY): Payer: Self-pay | Admitting: Orthopaedic Surgery

## 2015-04-13 DIAGNOSIS — R52 Pain, unspecified: Secondary | ICD-10-CM

## 2015-04-13 DIAGNOSIS — M545 Low back pain: Secondary | ICD-10-CM

## 2015-04-13 DIAGNOSIS — R531 Weakness: Secondary | ICD-10-CM

## 2015-04-15 ENCOUNTER — Emergency Department (HOSPITAL_COMMUNITY)
Admission: EM | Admit: 2015-04-15 | Discharge: 2015-04-15 | Disposition: A | Discharge status: Home / Self Care | Payer: Medicare Other | Attending: Emergency Medicine | Admitting: Emergency Medicine

## 2015-04-15 ENCOUNTER — Encounter (HOSPITAL_COMMUNITY): Payer: Self-pay

## 2015-04-15 DIAGNOSIS — M79604 Pain in right leg: Secondary | ICD-10-CM | POA: Diagnosis not present

## 2015-04-15 DIAGNOSIS — Z79899 Other long term (current) drug therapy: Secondary | ICD-10-CM | POA: Insufficient documentation

## 2015-04-15 DIAGNOSIS — E119 Type 2 diabetes mellitus without complications: Secondary | ICD-10-CM | POA: Diagnosis not present

## 2015-04-15 DIAGNOSIS — M25511 Pain in right shoulder: Secondary | ICD-10-CM | POA: Diagnosis not present

## 2015-04-15 DIAGNOSIS — M79605 Pain in left leg: Secondary | ICD-10-CM | POA: Diagnosis not present

## 2015-04-15 DIAGNOSIS — M545 Low back pain: Secondary | ICD-10-CM | POA: Diagnosis not present

## 2015-04-15 DIAGNOSIS — Z8669 Personal history of other diseases of the nervous system and sense organs: Secondary | ICD-10-CM | POA: Diagnosis not present

## 2015-04-15 DIAGNOSIS — Z794 Long term (current) use of insulin: Secondary | ICD-10-CM | POA: Diagnosis not present

## 2015-04-15 DIAGNOSIS — I10 Essential (primary) hypertension: Secondary | ICD-10-CM | POA: Insufficient documentation

## 2015-04-15 DIAGNOSIS — M797 Fibromyalgia: Secondary | ICD-10-CM | POA: Diagnosis not present

## 2015-04-15 DIAGNOSIS — M5440 Lumbago with sciatica, unspecified side: Secondary | ICD-10-CM | POA: Diagnosis not present

## 2015-04-15 DIAGNOSIS — M25512 Pain in left shoulder: Secondary | ICD-10-CM | POA: Diagnosis not present

## 2015-04-15 LAB — URINALYSIS, ROUTINE W REFLEX MICROSCOPIC
BILIRUBIN URINE: NEGATIVE
GLUCOSE, UA: NEGATIVE mg/dL
Hgb urine dipstick: NEGATIVE
KETONES UR: NEGATIVE mg/dL
LEUKOCYTES UA: NEGATIVE
Nitrite: NEGATIVE
PROTEIN: NEGATIVE mg/dL
Specific Gravity, Urine: 1.012 (ref 1.005–1.030)
pH: 5.5 (ref 5.0–8.0)

## 2015-04-15 MED ORDER — HYDROMORPHONE HCL 1 MG/ML IJ SOLN
1.0000 mg | Freq: Once | INTRAMUSCULAR | Status: AC
  Administered 2015-04-15: 1 mg via INTRAMUSCULAR
  Filled 2015-04-15: qty 1

## 2015-04-15 NOTE — ED Notes (Signed)
Pt did not want protocol percocet at this time.

## 2015-04-15 NOTE — ED Provider Notes (Signed)
CSN: [id number]     Arrival date & time 04/15/15  1219 History   First MD Initiated Contact with Patient 04/15/15 1511     Chief Complaint  Patient presents with  . Back Pain     (Consider location/radiation/quality/duration/timing/severity/associated sxs/prior Treatment) Patient is a 60 y.o. female presenting with back pain. The history is provided by the patient and medical records. No language interpreter was used.  Back Pain Associated symptoms: no abdominal pain, no dysuria, no fever and no weakness    Courtney [name] is a 60 y.o. female  with a PMH of fibromyalgia, DM, HTN who presents to the Emergency Department complaining of constant low back pain ever since she fell in October 2016. She has associated symptoms of bilateral leg pain described as burning and numbness which has been persistent over the last 3-4 months. She is followed by pain management for pain control. Seen by neurology and per their chart she should be on lyrica 150mg  BID for this pain, however patient says she is not taking medication because of the way it makes her feel. Patient was seen by [facility], Dr. Durward Fortes, last week for the same complaint. He has scheduled an outpatient MRI for tomorrow night (Monday) with a follow up appointment to discuss findings on Tuesday morning. Denies fever, b/b incontinence, night sweats, saddle anesthesia.   Of note, patient states diagnosis of MS. Seen by neurology on 12/09 which state MRI's of the spine and brain were reviewed and CNS was normal for age, therefore neurology is "uncertain of her diagnosis of MS"   Past Medical History  Diagnosis Date  . MS (multiple sclerosis) ([place])   . Fibromyalgia   . Diabetes mellitus without complication ([place])   . Hypertension   . Vision abnormalities    Past Surgical History  Procedure Laterality Date  . Colon surgery    . Cholecystectomy    . Abdominal hysterectomy    . Ctr    . Knee surgery    . Appendectomy      Family History  Problem Relation Age of Onset  . Heart disease Mother   . Kidney disease Mother   . Other Father   . Multiple sclerosis Daughter   . Multiple sclerosis Other    Social History  Substance Use Topics  . Smoking status: Never Smoker   . Smokeless tobacco: None  . Alcohol Use: No   OB History    No data available     Review of Systems  Constitutional: Negative for fever and chills.  HENT: Negative for congestion and sore throat.   Eyes: Negative for visual disturbance.  Respiratory: Negative for cough and shortness of breath.   Cardiovascular: Negative.   Gastrointestinal: Negative for nausea, vomiting and abdominal pain.  Genitourinary: Negative for dysuria.  Musculoskeletal: Positive for myalgias, back pain and arthralgias.  Skin: Negative for rash.  Neurological: Negative for dizziness and weakness.      Allergies  Prednisone; Solu-medrol; Bactrim; and Biaxin  Home Medications   Prior to Admission medications   Medication Sig Start Date End Date Taking? Authorizing Provider  acetaminophen (TYLENOL) 325 MG tablet Take 325 mg by mouth every 4 (four) hours as needed for moderate pain.   Yes Historical Provider, MD  alprazolam Duanne Moron) 2 MG tablet Take 2 mg by mouth 3 (three) times daily.   Yes Historical Provider, MD  Biotin 1000 MCG tablet Take 1,000 mcg by mouth daily.   Yes Historical Provider, MD  Cyanocobalamin (  VITAMIN B-12 IJ) Inject 1 mL as directed every 30 (thirty) days.    Yes Historical Provider, MD  insulin aspart protamine- aspart (NOVOLOG MIX 70/30) (70-30) 100 UNIT/ML injection Inject 26-30 Units into the skin 2 (two) times daily with a meal. 26 units in am, 30 units in pm   Yes Historical Provider, MD  lidocaine (XYLOCAINE) 5 % ointment Apply 1 application topically daily as needed for mild pain.   Yes Historical Provider, MD  oxyCODONE-acetaminophen (PERCOCET/ROXICET) 5-325 MG tablet Take 2 tablets by mouth every 4 (four) hours as needed  for severe pain. Patient taking differently: Take 2 tablets by mouth every 6 (six) hours.  02/03/15  Yes Chesley Noon Nadeau, PA-C  Telmisartan-Amlodipine 40-5 MG TABS Take 1 tablet by mouth daily.   Yes Historical Provider, MD  pregabalin (LYRICA) 150 MG capsule Take 1 capsule (150 mg total) by mouth 2 (two) times daily. Patient not taking: Reported on 04/15/2015 02/09/15   Britt Bottom, MD  traZODone (DESYREL) 100 MG tablet Take 1 tablet (100 mg total) by mouth at bedtime. Patient not taking: Reported on 04/15/2015 02/09/15   Britt Bottom, MD   BP 127/91 mmHg  Pulse 88  Temp(Src) 97.7 F (36.5 C) (Oral)  Resp 18  SpO2 100% Physical Exam  Constitutional: She is oriented to person, place, and time. She appears well-developed and well-nourished.  Alert and in no acute distress  HENT:  Head: Normocephalic and atraumatic.  Neck:  Full ROM without pain No midline tenderness  Cardiovascular: Normal rate, regular rhythm, normal heart sounds and intact distal pulses.  Exam reveals no gallop and no friction rub.   No murmur heard. Pulmonary/Chest: Effort normal and breath sounds normal. No respiratory distress. She has no wheezes. She has no rales.  Abdominal: Soft. Bowel sounds are normal. She exhibits no distension and no mass. There is no tenderness. There is no rebound and no guarding.  Musculoskeletal: She exhibits no edema.  Diffuse TTP of bilateral arms, legs, shoulders, and back. 5/5 muscle strength.  Sensation of all four extremities intact.   Neurological: She is alert and oriented to person, place, and time. She has normal reflexes.  Skin: Skin is warm and dry. No rash noted. No erythema.  Psychiatric: She has a normal mood and affect. Her behavior is normal. Judgment and thought content normal.  Nursing note and vitals reviewed.   ED Course  Procedures (including critical care time) Labs Review Labs Reviewed  URINALYSIS, ROUTINE W REFLEX MICROSCOPIC (NOT AT [facility])     Imaging Review No results found. I have personally reviewed and evaluated these images and lab results as part of my medical decision-making.   EKG Interpretation None      MDM   Final diagnoses:  Bilateral low back pain, with sciatica presence unspecified  Fibromyalgia   Courtney [name] presents for low back pain that has been unchanged since she fell in October 2016. There are no red flag symptoms of back pain. Patient admits to numbness and burning pain of lower extremities which is also unchanged over the last 3 months. She is followed by [facility] for this, last seen on 12/09. Should be on Lyrica 150mg  BID, however not taking because of the way it makes her feel. Patient is followed by Dr. Durward Fortes at [facility] who has scheduled an outpatient MRI for tomorrow evening - at this point, I do not believe imaging is necessarily. No indication for emergent MRI or concern for cauda  equina or abscess. Will treat patient with 1mg  dilaudid while in ED, then encourage patient to get outpatient MRI tomorrow. She is aware that she has a follow up appointment with Dr. Durward Fortes on Tuesday to discuss MRI findings. Due to patient's pain management contract, no rx for narcotic medication will be given.   UA wdl.   5:30 PM - Patient re-evaluated and pain improved.   Patient discussed with Dr. Zenia Resides who agrees with treatment plan.   [facility] Rogue Pautler, PA-C 04/15/15 1730  Lacretia Leigh, MD 04/15/15 [phone]

## 2015-04-15 NOTE — ED Notes (Signed)
Pt stated unable to give urine sample at this time 

## 2015-04-15 NOTE — ED Notes (Addendum)
Pt has chronic back pain.  Pt was referred to pain management and physical therapy.  Pain continues.  Supposed to have MRI tomorrow.  Pt was taking percocet and pain is not controlled with that.  Pt states pain is now in thighs/legs with numbness.  Also was taking tylenol wihtout relief along with the percocet

## 2015-04-15 NOTE — Discharge Instructions (Signed)
Please go for your MRI tomorrow evening and keep all scheduled follow up appointments.  Return to ER for new or worsening symptoms, any additional concerns.

## 2015-04-16 ENCOUNTER — Ambulatory Visit (HOSPITAL_COMMUNITY)
Admission: RE | Admit: 2015-04-16 | Discharge: 2015-04-16 | Disposition: A | Discharge status: Home / Self Care | Payer: Medicare Other | Source: Ambulatory Visit | Attending: Orthopaedic Surgery | Admitting: Orthopaedic Surgery

## 2015-04-16 DIAGNOSIS — M545 Low back pain: Secondary | ICD-10-CM | POA: Diagnosis not present

## 2015-04-16 DIAGNOSIS — M47896 Other spondylosis, lumbar region: Secondary | ICD-10-CM | POA: Diagnosis not present

## 2015-04-16 DIAGNOSIS — R531 Weakness: Secondary | ICD-10-CM

## 2015-04-16 DIAGNOSIS — R52 Pain, unspecified: Secondary | ICD-10-CM

## 2015-04-17 DIAGNOSIS — M545 Low back pain: Secondary | ICD-10-CM | POA: Diagnosis not present

## 2015-04-17 DIAGNOSIS — M47817 Spondylosis without myelopathy or radiculopathy, lumbosacral region: Secondary | ICD-10-CM | POA: Diagnosis not present

## 2015-04-17 DIAGNOSIS — M797 Fibromyalgia: Secondary | ICD-10-CM | POA: Diagnosis not present

## 2015-04-25 DIAGNOSIS — G894 Chronic pain syndrome: Secondary | ICD-10-CM | POA: Diagnosis not present

## 2015-05-07 DIAGNOSIS — M47817 Spondylosis without myelopathy or radiculopathy, lumbosacral region: Secondary | ICD-10-CM | POA: Diagnosis not present

## 2015-05-21 DIAGNOSIS — M25561 Pain in right knee: Secondary | ICD-10-CM | POA: Diagnosis not present

## 2015-05-21 DIAGNOSIS — M25562 Pain in left knee: Secondary | ICD-10-CM | POA: Diagnosis not present

## 2015-05-25 ENCOUNTER — Telehealth: Payer: Self-pay | Admitting: Cardiology

## 2015-05-25 NOTE — Telephone Encounter (Signed)
Record received from [facility] placed in chart prep  bin.

## 2015-05-30 DIAGNOSIS — G894 Chronic pain syndrome: Secondary | ICD-10-CM | POA: Diagnosis not present

## 2015-06-06 ENCOUNTER — Ambulatory Visit: Payer: Medicare Other | Admitting: Neurology

## 2015-06-11 DIAGNOSIS — R8290 Unspecified abnormal findings in urine: Secondary | ICD-10-CM | POA: Diagnosis not present

## 2015-06-11 DIAGNOSIS — R350 Frequency of micturition: Secondary | ICD-10-CM | POA: Diagnosis not present

## 2015-06-11 DIAGNOSIS — Z0000 Encounter for general adult medical examination without abnormal findings: Secondary | ICD-10-CM | POA: Diagnosis not present

## 2015-06-14 ENCOUNTER — Emergency Department (HOSPITAL_COMMUNITY): Payer: Medicare Other

## 2015-06-14 ENCOUNTER — Encounter (HOSPITAL_COMMUNITY): Payer: Self-pay | Admitting: Emergency Medicine

## 2015-06-14 ENCOUNTER — Emergency Department (HOSPITAL_COMMUNITY)
Admission: EM | Admit: 2015-06-14 | Discharge: 2015-06-14 | Disposition: A | Discharge status: Home / Self Care | Payer: Medicare Other | Attending: Emergency Medicine | Admitting: Emergency Medicine

## 2015-06-14 DIAGNOSIS — M25522 Pain in left elbow: Secondary | ICD-10-CM | POA: Diagnosis not present

## 2015-06-14 DIAGNOSIS — Y9301 Activity, walking, marching and hiking: Secondary | ICD-10-CM | POA: Insufficient documentation

## 2015-06-14 DIAGNOSIS — I10 Essential (primary) hypertension: Secondary | ICD-10-CM | POA: Diagnosis not present

## 2015-06-14 DIAGNOSIS — Y92410 Unspecified street and highway as the place of occurrence of the external cause: Secondary | ICD-10-CM | POA: Insufficient documentation

## 2015-06-14 DIAGNOSIS — S4992XA Unspecified injury of left shoulder and upper arm, initial encounter: Secondary | ICD-10-CM | POA: Diagnosis not present

## 2015-06-14 DIAGNOSIS — Z794 Long term (current) use of insulin: Secondary | ICD-10-CM | POA: Diagnosis not present

## 2015-06-14 DIAGNOSIS — Y998 Other external cause status: Secondary | ICD-10-CM | POA: Insufficient documentation

## 2015-06-14 DIAGNOSIS — M797 Fibromyalgia: Secondary | ICD-10-CM | POA: Diagnosis not present

## 2015-06-14 DIAGNOSIS — S5002XA Contusion of left elbow, initial encounter: Secondary | ICD-10-CM | POA: Insufficient documentation

## 2015-06-14 DIAGNOSIS — Z8669 Personal history of other diseases of the nervous system and sense organs: Secondary | ICD-10-CM | POA: Diagnosis not present

## 2015-06-14 DIAGNOSIS — E119 Type 2 diabetes mellitus without complications: Secondary | ICD-10-CM | POA: Diagnosis not present

## 2015-06-14 DIAGNOSIS — Z79899 Other long term (current) drug therapy: Secondary | ICD-10-CM | POA: Insufficient documentation

## 2015-06-14 DIAGNOSIS — S59902A Unspecified injury of left elbow, initial encounter: Secondary | ICD-10-CM | POA: Diagnosis present

## 2015-06-14 DIAGNOSIS — M7989 Other specified soft tissue disorders: Secondary | ICD-10-CM | POA: Diagnosis not present
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
## 2015-06-14 NOTE — Discharge Instructions (Signed)
Elbow Contusion An elbow contusion is a deep bruise of the elbow. Contusions are the result of an injury that caused bleeding under the skin. The contusion may turn blue, purple, or yellow. Minor injuries will give you a painless contusion, but more severe contusions may stay painful and swollen for a few weeks.  CAUSES  An elbow contusion comes from a direct force to that area, such as falling on the elbow. SYMPTOMS   Swelling and redness of the elbow.  Bruising of the elbow area.  Tenderness or soreness of the elbow. DIAGNOSIS  You will have a physical exam and will be asked about your history. You may need an X-ray of your elbow to look for a broken bone (fracture).  TREATMENT  A sling or splint may be needed to support your injury. Resting, elevating, and applying cold compresses to the elbow area are often the best treatments for an elbow contusion. Over-the-counter medicines may also be recommended for pain control. HOME CARE INSTRUCTIONS   Put ice on the injured area.  Put ice in a plastic bag.  Place a towel between your skin and the bag.  Leave the ice on for 15-20 minutes, 03-04 times a day.  Only take over-the-counter or prescription medicines for pain, discomfort, or fever as directed by your caregiver.  Rest your injured elbow until the pain and swelling are better.  Elevate your elbow to reduce swelling.  Apply a compression wrap as directed by your caregiver. This can help reduce swelling and motion. You may remove the wrap for sleeping, showers, and baths. If your fingers become numb, cold, or blue, take the wrap off and reapply it more loosely.  Use your elbow only as directed by your caregiver. You may be asked to do range of motion exercises. Do them as directed.  See your caregiver as directed. It is very important to keep all follow-up appointments in order to avoid any long-term problems with your elbow, including chronic pain or inability to move your elbow  normally. SEEK IMMEDIATE MEDICAL CARE IF:   You have increased redness, swelling, or pain in your elbow.  Your swelling or pain is not relieved with medicines.  You have swelling of the hand and fingers.  You are unable to move your fingers or wrist.  You begin to lose feeling in your hand or fingers.  Your fingers or hand become cold or blue. MAKE SURE YOU:   Understand these instructions.  Will watch your condition.  Will get help right away if you are not doing well or get worse.   This information is not intended to replace advice given to you by your health care provider. Make sure you discuss any questions you have with your health care provider.   Document Released: 01/26/2006 Document Revised: 05/12/2011 Document Reviewed: 10/02/2014 Elsevier Interactive Patient Education [employer].
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
## 2015-06-27 DIAGNOSIS — M542 Cervicalgia: Secondary | ICD-10-CM | POA: Diagnosis not present

## 2015-06-27 DIAGNOSIS — G894 Chronic pain syndrome: Secondary | ICD-10-CM | POA: Diagnosis not present

## 2015-07-02 DIAGNOSIS — E119 Type 2 diabetes mellitus without complications: Secondary | ICD-10-CM | POA: Diagnosis not present

## 2015-07-02 DIAGNOSIS — R202 Paresthesia of skin: Secondary | ICD-10-CM | POA: Diagnosis not present

## 2015-07-02 DIAGNOSIS — M47817 Spondylosis without myelopathy or radiculopathy, lumbosacral region: Secondary | ICD-10-CM | POA: Diagnosis not present

## 2015-07-02 DIAGNOSIS — Z794 Long term (current) use of insulin: Secondary | ICD-10-CM | POA: Diagnosis not present

## 2015-07-02 DIAGNOSIS — G629 Polyneuropathy, unspecified: Secondary | ICD-10-CM | POA: Diagnosis not present

## 2015-07-03 DIAGNOSIS — E118 Type 2 diabetes mellitus with unspecified complications: Secondary | ICD-10-CM | POA: Diagnosis not present

## 2015-07-03 DIAGNOSIS — I10 Essential (primary) hypertension: Secondary | ICD-10-CM | POA: Diagnosis not present

## 2015-07-03 DIAGNOSIS — G35 Multiple sclerosis: Secondary | ICD-10-CM | POA: Diagnosis not present

## 2015-07-03 DIAGNOSIS — M797 Fibromyalgia: Secondary | ICD-10-CM | POA: Diagnosis not present

## 2015-07-03 DIAGNOSIS — E083393 Diabetes mellitus due to underlying condition with moderate nonproliferative diabetic retinopathy without macular edema, bilateral: Secondary | ICD-10-CM | POA: Diagnosis not present

## 2015-07-03 DIAGNOSIS — F338 Other recurrent depressive disorders: Secondary | ICD-10-CM | POA: Diagnosis not present

## 2015-07-05 DIAGNOSIS — M542 Cervicalgia: Secondary | ICD-10-CM | POA: Diagnosis not present

## 2015-07-05 DIAGNOSIS — M6281 Muscle weakness (generalized): Secondary | ICD-10-CM | POA: Diagnosis not present

## 2015-07-10 DIAGNOSIS — M545 Low back pain: Secondary | ICD-10-CM | POA: Diagnosis not present

## 2015-07-10 DIAGNOSIS — M6281 Muscle weakness (generalized): Secondary | ICD-10-CM | POA: Diagnosis not present

## 2015-07-10 DIAGNOSIS — M542 Cervicalgia: Secondary | ICD-10-CM | POA: Diagnosis not present

## 2015-07-12 DIAGNOSIS — M542 Cervicalgia: Secondary | ICD-10-CM | POA: Diagnosis not present

## 2015-07-12 DIAGNOSIS — M6281 Muscle weakness (generalized): Secondary | ICD-10-CM | POA: Diagnosis not present

## 2015-07-18 ENCOUNTER — Ambulatory Visit (INDEPENDENT_AMBULATORY_CARE_PROVIDER_SITE_OTHER): Payer: Medicare Other | Admitting: Interventional Cardiology

## 2015-07-18 ENCOUNTER — Encounter: Payer: Self-pay | Admitting: Interventional Cardiology

## 2015-07-18 ENCOUNTER — Ambulatory Visit: Payer: Medicare Other | Admitting: Cardiology

## 2015-07-18 VITALS — BP 130/78 | HR 86 | Ht 62.0 in | Wt 171.8 lb

## 2015-07-18 DIAGNOSIS — R9431 Abnormal electrocardiogram [ECG] [EKG]: Secondary | ICD-10-CM

## 2015-07-18 DIAGNOSIS — I10 Essential (primary) hypertension: Secondary | ICD-10-CM | POA: Insufficient documentation

## 2015-07-18 DIAGNOSIS — G35 Multiple sclerosis: Secondary | ICD-10-CM | POA: Diagnosis not present

## 2015-07-18 DIAGNOSIS — R0609 Other forms of dyspnea: Secondary | ICD-10-CM | POA: Diagnosis not present

## 2015-07-18 DIAGNOSIS — E119 Type 2 diabetes mellitus without complications: Secondary | ICD-10-CM | POA: Diagnosis not present

## 2015-07-18 DIAGNOSIS — Z794 Long term (current) use of insulin: Secondary | ICD-10-CM

## 2015-07-18 HISTORY — DX: Abnormal electrocardiogram (ECG) (EKG): R94.31

## 2015-07-18 HISTORY — DX: Type 2 diabetes mellitus without complications: E11.9

## 2015-07-18 NOTE — Patient Instructions (Signed)

## 2015-07-18 NOTE — Progress Notes (Signed)
Patient ID: Courtney [name], female   DOB: [birth year], 60 y.o.   MRN: [id number]     Cardiology Office Note   Date:  07/18/2015   ID:  Courtney [name], DOB 05/05/1955, MRN [id number]  PCP:  Courtney Peers, MD    Chief Complaint  Patient presents with  . New Evaluation    establish care     Wt Readings from Last 3 Encounters:  07/18/15 171 lb 12.8 oz (77.928 kg)  02/09/15 167 lb (75.751 kg)  02/03/15 167 lb 1.6 oz (75.796 kg)       History of Present Illness: Courtney [name] is a 60 y.o. female who has had a h/o recurrent chest pain.  SHe has been evaluated with cardiac cath in 2001 and 2006.  SHe has had multiple stress tests most recently in 2016 which was normal.    She moved to [place] in 10/16 to find warmer weather.  She has been diagnosed with MS and wants to be in a warmer climate.  She had some DOE a few weeks ago but this has improved.  She feels a heaviness in her legs when she walks.  No CP with walking.    She will occasionally have a skipped heart beat.  It used to be frequent but now it is rare.  Better with exercise.  Walking is her most common exercise.  She rarely consumes caffeine.     Past Medical History  Diagnosis Date  . MS (multiple sclerosis) ([place])   . Fibromyalgia   . Diabetes mellitus without complication ([place])   . Hypertension   . Vision abnormalities     Past Surgical History  Procedure Laterality Date  . Colon surgery    . Cholecystectomy    . Abdominal hysterectomy    . Ctr    . Knee surgery    . Appendectomy       Current Outpatient Prescriptions  Medication Sig Dispense Refill  . alprazolam (XANAX) 2 MG tablet Take 2 mg by mouth 3 (three) times daily.    . Biotin 1000 MCG tablet Take 1,000 mcg by mouth daily.    . Cyanocobalamin (VITAMIN B-12 IJ) Inject 1 mL as directed every 30 (thirty) days.     Marland Kitchen HYDROcodone-acetaminophen (NORCO/VICODIN) 5-325 MG tablet Take 1 tablet by mouth every 6 (six) hours as needed for  moderate pain.    Marland Kitchen insulin aspart protamine- aspart (NOVOLOG MIX 70/30) (70-30) 100 UNIT/ML injection Inject 26-30 Units into the skin 2 (two) times daily with a meal. 26 units in am, 30 units in pm    . lidocaine (XYLOCAINE) 5 % ointment Apply 1 application topically daily as needed for mild pain.    Marland Kitchen oxyCODONE (OXYCONTIN) 10 mg 12 hr tablet Take 10 mg by mouth every 12 (twelve) hours.    . Telmisartan-Amlodipine 40-5 MG TABS Take 0.5 tablets by mouth daily.      No current facility-administered medications for this visit.    Allergies:   Prednisone; Solu-medrol; Bactrim; and Biaxin    Social History:  The patient  reports that she has never smoked. She does not have any smokeless tobacco history on file. She reports that she does not drink alcohol or use illicit drugs.   Family History:  [name] family history includes Heart disease in her mother; Kidney disease in her mother; Multiple sclerosis in her daughter and other; Other in her father.    ROS:  Please see the history of  present illness.   Otherwise, review of systems are positive for neuro sx associated with MS.   All other systems are reviewed and negative.    PHYSICAL EXAM: VS:  BP 130/78 mmHg  Pulse 86  Ht 5\' 2"  (1.575 m)  Wt 171 lb 12.8 oz (77.928 kg)  BMI 31.41 kg/m2 , BMI Body mass index is 31.41 kg/(m^2). GEN: Well nourished, well developed, in no acute distress HEENT: normal Neck: no JVD, carotid bruits, or masses Cardiac: RRR; 1/6 early systolic murmur, no rubs, or gallops,no edema  Respiratory:  clear to auscultation bilaterally, normal work of breathing GI: soft, nontender, nondistended, + BS MS: no deformity or atrophy Skin: warm and dry, no rash, scarring on top of right foot Neuro:  Strength and sensation are intact Psych: euthymic mood, full affect   EKG:   The ekg ordered today demonstrates NSR, LVH with associated ST segment changes   Recent Labs: 02/03/2015: BUN 13; Creatinine, Ser 0.97;  Hemoglobin 11.9*; Platelets 147*; Potassium 4.1; Sodium 140   Lipid Panel No results found for: CHOL, TRIG, HDL, CHOLHDL, VLDL, LDLCALC, LDLDIRECT   Other studies Reviewed: Additional studies/ records that were reviewed today with results demonstrating: records from [place] reviewed.  ECG changes were present in the past.   ASSESSMENT AND PLAN:  1. Chest pain: Cardiac w/u in the past has been negative.  She has had a dye "allergy" in the past and has an allergy to prednisone as well. However, upon further questioning, she just feels warm with the dye injection. Not a true allergy.  No rash or airway issues.   2. DM: Controlled on insulin.  She is in contact with her endocrinologist in [place].  She has a sliding scale of insulin that she uses when she is on steroids for MS or fibromyalgia. 3. Multiple sclerosis: Sx managed; able to walk for exercise.  She has trouble with cold weather.   4. Blood work checked with Dr. Criss [name].  5. HTN: BP well controlled.  LVH by ECG.    Current medicines are reviewed at length with the patient today.  The patient concerns regarding her medicines were addressed.  The following changes have been made:  No change  Labs/ tests ordered today include:  No orders of the defined types were placed in this encounter.    Recommend 150 minutes/week of aerobic exercise Low fat, low carb, high fiber diet recommended  Disposition:   FU in 1 year   Signed, Courtney Grooms, MD  07/18/2015 10:56 AM    [facility] [place], [place], [place]  [address] Phone: [phone]; Fax: [phone]

## 2015-07-25 DIAGNOSIS — M542 Cervicalgia: Secondary | ICD-10-CM | POA: Diagnosis not present

## 2015-07-25 DIAGNOSIS — G894 Chronic pain syndrome: Secondary | ICD-10-CM | POA: Diagnosis not present

## 2015-08-22 DIAGNOSIS — G894 Chronic pain syndrome: Secondary | ICD-10-CM | POA: Diagnosis not present

## 2015-08-22 DIAGNOSIS — M542 Cervicalgia: Secondary | ICD-10-CM | POA: Diagnosis not present

## 2015-09-03 DIAGNOSIS — I10 Essential (primary) hypertension: Secondary | ICD-10-CM | POA: Diagnosis not present

## 2015-09-03 DIAGNOSIS — M797 Fibromyalgia: Secondary | ICD-10-CM | POA: Diagnosis not present

## 2015-09-03 DIAGNOSIS — E118 Type 2 diabetes mellitus with unspecified complications: Secondary | ICD-10-CM | POA: Diagnosis not present

## 2015-09-03 DIAGNOSIS — G35 Multiple sclerosis: Secondary | ICD-10-CM | POA: Diagnosis not present

## 2015-09-26 DIAGNOSIS — Z6829 Body mass index (BMI) 29.0-29.9, adult: Secondary | ICD-10-CM | POA: Diagnosis not present

## 2015-09-26 DIAGNOSIS — N3281 Overactive bladder: Secondary | ICD-10-CM | POA: Diagnosis not present

## 2015-09-26 DIAGNOSIS — L630 Alopecia (capitis) totalis: Secondary | ICD-10-CM | POA: Diagnosis not present

## 2015-09-26 DIAGNOSIS — F064 Anxiety disorder due to known physiological condition: Secondary | ICD-10-CM | POA: Diagnosis not present

## 2015-09-26 DIAGNOSIS — M797 Fibromyalgia: Secondary | ICD-10-CM | POA: Diagnosis not present

## 2015-09-26 DIAGNOSIS — G35 Multiple sclerosis: Secondary | ICD-10-CM | POA: Diagnosis not present

## 2015-09-26 DIAGNOSIS — I10 Essential (primary) hypertension: Secondary | ICD-10-CM | POA: Diagnosis not present

## 2015-09-26 DIAGNOSIS — E118 Type 2 diabetes mellitus with unspecified complications: Secondary | ICD-10-CM | POA: Diagnosis not present

## 2015-10-01 DIAGNOSIS — Z1231 Encounter for screening mammogram for malignant neoplasm of breast: Secondary | ICD-10-CM | POA: Diagnosis not present

## 2015-10-01 DIAGNOSIS — M542 Cervicalgia: Secondary | ICD-10-CM | POA: Diagnosis not present

## 2015-10-01 DIAGNOSIS — Z803 Family history of malignant neoplasm of breast: Secondary | ICD-10-CM | POA: Diagnosis not present

## 2015-10-01 DIAGNOSIS — G894 Chronic pain syndrome: Secondary | ICD-10-CM | POA: Diagnosis not present

## 2015-10-01 DIAGNOSIS — M150 Primary generalized (osteo)arthritis: Secondary | ICD-10-CM | POA: Diagnosis not present

## 2015-10-08 DIAGNOSIS — E119 Type 2 diabetes mellitus without complications: Secondary | ICD-10-CM | POA: Diagnosis not present

## 2015-10-08 DIAGNOSIS — S80921A Unspecified superficial injury of right lower leg, initial encounter: Secondary | ICD-10-CM | POA: Diagnosis not present

## 2015-10-17 DIAGNOSIS — M47817 Spondylosis without myelopathy or radiculopathy, lumbosacral region: Secondary | ICD-10-CM | POA: Diagnosis not present

## 2015-10-20 ENCOUNTER — Encounter (INDEPENDENT_AMBULATORY_CARE_PROVIDER_SITE_OTHER): Payer: Self-pay

## 2015-10-29 DIAGNOSIS — G894 Chronic pain syndrome: Secondary | ICD-10-CM | POA: Diagnosis not present

## 2015-10-29 DIAGNOSIS — M542 Cervicalgia: Secondary | ICD-10-CM | POA: Diagnosis not present

## 2015-11-01 DIAGNOSIS — M5010 Cervical disc disorder with radiculopathy, unspecified cervical region: Secondary | ICD-10-CM | POA: Diagnosis not present

## 2015-11-01 DIAGNOSIS — M797 Fibromyalgia: Secondary | ICD-10-CM | POA: Diagnosis not present

## 2015-11-01 DIAGNOSIS — M47816 Spondylosis without myelopathy or radiculopathy, lumbar region: Secondary | ICD-10-CM | POA: Diagnosis not present

## 2015-11-08 DIAGNOSIS — Z6829 Body mass index (BMI) 29.0-29.9, adult: Secondary | ICD-10-CM | POA: Diagnosis not present

## 2015-11-08 DIAGNOSIS — Z01419 Encounter for gynecological examination (general) (routine) without abnormal findings: Secondary | ICD-10-CM | POA: Diagnosis not present

## 2015-11-09 ENCOUNTER — Encounter: Payer: Self-pay | Admitting: Pulmonary Disease

## 2015-11-09 ENCOUNTER — Ambulatory Visit (INDEPENDENT_AMBULATORY_CARE_PROVIDER_SITE_OTHER): Payer: Medicare Other | Admitting: Pulmonary Disease

## 2015-11-09 VITALS — BP 102/62 | HR 73 | Ht 62.0 in | Wt 168.0 lb

## 2015-11-09 DIAGNOSIS — G4710 Hypersomnia, unspecified: Secondary | ICD-10-CM | POA: Diagnosis not present

## 2015-11-09 DIAGNOSIS — G35 Multiple sclerosis: Secondary | ICD-10-CM | POA: Diagnosis not present

## 2015-11-09 DIAGNOSIS — G4700 Insomnia, unspecified: Secondary | ICD-10-CM

## 2015-11-09 HISTORY — DX: Hypersomnia, unspecified: G47.10

## 2015-11-09 NOTE — Patient Instructions (Signed)

## 2015-11-09 NOTE — Assessment & Plan Note (Signed)
Patient has had multiple sclerosis since 2008. Her multiple sclerosis has been stable the last 6 months. She is chronic pain and fatigue related to this. She takes OxyContin twice a day.

## 2015-11-09 NOTE — Assessment & Plan Note (Signed)
Patient has insomnia most likely related to untreated obstructive sleep apnea, mental health issues of depression and anxiety, multiple sclerosis, poor sleep habits/hygiene. Plan to diagnose and treat sleep apnea. We'll need to address sleep habits and hygiene on follow-up. She takes oxycodone twice a day and benzodiazepine scheduled for chronic pain and anxiety.

## 2015-11-09 NOTE — Progress Notes (Signed)
Subjective:    Patient ID: Courtney [name], female    DOB: [birth year], 60 y.o.   MRN: [id number]  HPI   This is the case of Courtney [name], 59 y.o. Female, who was referred by Dr. Lucianne Lei in consultation regarding  Insomnia and possible OSA.  As you very well know, patient had a sleep lab study in [place] in 2007 which was (-) for OSA. She has had insomnia since being diagnosed with MS in 2008.   She goes to bed at around 10 pm ish. She ends up falling asleep after 4 hrs roughly usually. She looks at the clock every 30 minutes. She always wakes up at 3:15 am and falls asleep again at 4-4:30am until 5-6am. She would have slept 3-4 hrs per night. Denies naps during daytime ever. Feels unrefreshed and exhausted when she wakes up. Has snoring, witnessed apneas, occasional gasping or choking. Occasional sleep talking. Occasional RBD. Has woken up and she was "trying to eat".  Patient with severe hypersomnia 10 yrs ago while in [place], she was falling asleep driving.   She is exhausted and sleepy all day. Hypersomnia affects her fxnality.   Her MS has been stable x 6 mos.   Denies smoking now. Smoked 3-4 cigs x 15 yrs, quit in 1989. She had asthma related to work exposure to mold in 1989. She has since been off meds. Asthma is stable on prn albuterol.      Review of Systems  Constitutional: Negative.  Negative for fever and unexpected weight change.  HENT: Positive for congestion. Negative for dental problem, ear pain, nosebleeds, postnasal drip, rhinorrhea, sinus pressure, sneezing, sore throat and trouble swallowing.   Eyes: Negative.  Negative for redness and itching.  Respiratory: Negative.  Negative for cough, chest tightness, shortness of breath and wheezing.   Cardiovascular: Positive for palpitations. Negative for leg swelling.  Gastrointestinal: Negative.  Negative for nausea and vomiting.  Endocrine: Negative.   Genitourinary: Negative.  Negative for dysuria.    Musculoskeletal: Positive for arthralgias, joint swelling and myalgias.  Skin: Negative.  Negative for rash.  Allergic/Immunologic: Negative.   Neurological: Positive for headaches.  Hematological: Negative.  Does not bruise/bleed easily.  Psychiatric/Behavioral: Negative.  Negative for dysphoric mood. The patient is not nervous/anxious.    Past Medical History:  Diagnosis Date  . Diabetes mellitus without complication ([place])   . Fibromyalgia   . Hypertension   . MS (multiple sclerosis) ([place])   . Vision abnormalities    (-) DVT, CA  Family History  Problem Relation Age of Onset  . Heart disease Mother   . Kidney disease Mother   . Other Father   . Multiple sclerosis Daughter   . Multiple sclerosis Other      Past Surgical History:  Procedure Laterality Date  . ABDOMINAL HYSTERECTOMY    . APPENDECTOMY    . CHOLECYSTECTOMY    . COLON SURGERY    . CTR    . KNEE SURGERY      Social History   Social History  . Marital status: Married    Spouse name: N/A  . Number of children: N/A  . Years of education: N/A   Occupational History  . Not on file.   Social History Main Topics  . Smoking status: Never Smoker  . Smokeless tobacco: Not on file  . Alcohol use No  . Drug use: No  . Sexual activity: Not on file   Other Topics Concern  .  Not on file   Social History Narrative  . No narrative on file   Married, with 2 children, on disability. Moved to [place] in 12/2014 from [place] to have better weather.   Allergies  Allergen Reactions  . Ibuprofen   . Prednisone Anaphylaxis, Shortness Of Breath and Palpitations  . Solu-Medrol [Methylprednisolone] Anaphylaxis, Shortness Of Breath and Palpitations  . Bactrim [Sulfamethoxazole-Trimethoprim] Other (See Comments)    "blotchiness" redness, face swelling  . Biaxin [Clarithromycin] Swelling  . Naproxen      Outpatient Medications Prior to Visit  Medication Sig Dispense Refill  . alprazolam (XANAX) 2 MG tablet Take 2 mg by  mouth 3 (three) times daily.    . Biotin 1000 MCG tablet Take 1,000 mcg by mouth daily.    . insulin aspart protamine- aspart (NOVOLOG MIX 70/30) (70-30) 100 UNIT/ML injection Inject 26-30 Units into the skin 2 (two) times daily with a meal. 26 units in am, 30 units in pm    . lidocaine (XYLOCAINE) 5 % ointment Apply 1 application topically daily as needed for mild pain.    Marland Kitchen oxyCODONE (OXYCONTIN) 10 mg 12 hr tablet Take 10 mg by mouth every 12 (twelve) hours.    . Telmisartan-Amlodipine 40-5 MG TABS Take 0.5 tablets by mouth daily.     . Cyanocobalamin (VITAMIN B-12 IJ) Inject 1 mL as directed every 30 (thirty) days.     Marland Kitchen HYDROcodone-acetaminophen (NORCO/VICODIN) 5-325 MG tablet Take 1 tablet by mouth every 6 (six) hours as needed for moderate pain.     No facility-administered medications prior to visit.    Meds ordered this encounter  Medications  . BYDUREON 2 MG PEN    Sig: Inject 2 mg as directed once a week.    Refill:  5  . oxyCODONE-acetaminophen (PERCOCET) 10-325 MG tablet    Sig: Take 1 tablet by mouth 5 (five) times daily as needed.    Refill:  0         Objective:   Physical Exam  Vitals:  Vitals:   11/09/15 1126  BP: 102/62  Pulse: 73  SpO2: 95%  Weight: 168 lb (76.2 kg)  Height: 5\' 2"  (1.575 m)    Constitutional/General:  Pleasant, well-nourished, well-developed, not in any distress,  Comfortably seating.  Well kempt  Body mass index is 30.73 kg/m. Wt Readings from Last 3 Encounters:  11/09/15 168 lb (76.2 kg)  07/18/15 171 lb 12.8 oz (77.9 kg)  02/09/15 167 lb (75.8 kg)    Neck circumference:   HEENT: Pupils equal and reactive to light and accommodation. Anicteric sclerae. Normal nasal mucosa.   No oral  lesions,  mouth clear,  oropharynx clear, no postnasal drip. (-) Oral thrush. No dental caries.  Airway - Mallampati class III  Neck: No masses. Midline trachea. No JVD, (-) LAD. (-) bruits appreciated.  Respiratory/Chest: Grossly normal chest.  (-) deformity. (-) Accessory muscle use.  Symmetric expansion. (-) Tenderness on palpation.  Resonant on percussion.  Diminished BS on both lower lung zones. (-) wheezing, crackles, rhonchi (-) egophony  Cardiovascular: Regular rate and  rhythm, heart sounds normal, no murmur or gallops, no peripheral edema  Gastrointestinal:  Normal bowel sounds. Soft, non-tender. No hepatosplenomegaly.  (-) masses.   Musculoskeletal:  Normal muscle tone. Normal gait.   Extremities: Grossly normal. (-) clubbing, cyanosis.  (-) edema  Skin: (-) rash,lesions seen.   Neurological/Psychiatric : alert, oriented to time, place, person. Normal mood and affect  Assessment & Plan:  Hypersomnia Pt is being seen for insomnia, possible OSA.   Patient had a sleep lab study in [place] in 2007 which was (-) for OSA. She has had insomnia since being diagnosed with MS in 2008.   She goes to bed at around 10 pm ish. She ends up falling asleep after 4 hrs roughly usually. She looks at the clock every 30 minutes. She always wakes up at 3:15 am and falls asleep again at 4-4:30am until 5-6am. She would have slept 3-4 hrs per night. Denies naps during daytime ever. Feels unrefreshed and exhausted when she wakes up. Has snoring, witnessed apneas, occasional gasping or choking. Occasional sleep talking. Occasional RBD. Has woken up and she was "trying to eat".  Patient with severe hypersomnia 10 yrs ago while in [place], she was falling asleep driving.   She is exhausted and sleepy all day. Hypersomnia affects her fxnality.   Her MS has been stable x 6 mos.   Plan :  Patient has history of severe hypersomnia to the point that she was falling asleep driving. The last 10 years or so, she has more insomnia. I think at the end of the day, she still has obstructive sleep apnea and her manifestation is insomnia. She had a sleep study in [place] in 2007 which was negative. She has multiple sclerosis and takes pain  medications and benzodiazepines which can make her sleepy and make sleep apnea worse.  We discussed about the diagnosis of Obstructive Sleep Apnea (OSA) and implications of untreated OSA. We discussed about CPAP and BiPaP as possible treatment options.   We will schedule the patient for a sleep study.  Plan for a HST. Anticipate she will not have issues with CPAP. If she ends up being on CPAP, we need to address sleep habits and sleep hygiene. 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   Patient was instructed to call the office if he/she has not heard back from the office 1-2 weeks after the sleep study.   Patient was instructed to call the office if he/she is having issues with the PAP device.   We discussed good sleep hygiene.   Patient was advised not to engage in activities requiring concentration and/or vigilance if he/she is sleepy.  Patient was advised not to drive if he/she is sleepy.     Insomnia Patient has insomnia most likely related to untreated obstructive sleep apnea, mental health issues of depression and anxiety, multiple sclerosis, poor sleep habits/hygiene. Plan to diagnose and treat sleep apnea. We'll need to address sleep habits and hygiene on follow-up. She takes oxycodone twice a day and benzodiazepine scheduled for chronic pain and anxiety.  Multiple sclerosis ([place]) Patient has had multiple sclerosis since 2008. Her multiple sclerosis has been stable the last 6 months. She is chronic pain and fatigue related to this. She takes OxyContin twice a day.    Thank you very much for letting me participate in this patient's care. Please do not hesitate to give me a call if you have any questions or concerns regarding the treatment plan.   Patient will follow up with me in 6-8 weeks.     Monica Becton, MD 11/09/2015   1:08 PM [facility] [place] Pager:  [phone] Office: [phone], Fax: [phone]

## 2015-11-09 NOTE — Assessment & Plan Note (Addendum)
Pt is being seen for insomnia, possible OSA.   Patient had a sleep lab study in [place] in 2007 which was (-) for OSA. She has had insomnia since being diagnosed with MS in 2008.   She goes to bed at around 10 pm ish. She ends up falling asleep after 4 hrs roughly usually. She looks at the clock every 30 minutes. She always wakes up at 3:15 am and falls asleep again at 4-4:30am until 5-6am. She would have slept 3-4 hrs per night. Denies naps during daytime ever. Feels unrefreshed and exhausted when she wakes up. Has snoring, witnessed apneas, occasional gasping or choking. Occasional sleep talking. Occasional RBD. Has woken up and she was "trying to eat".  Patient with severe hypersomnia 10 yrs ago while in [place], she was falling asleep driving.   She is exhausted and sleepy all day. Hypersomnia affects her fxnality.   Her MS has been stable x 6 mos.   Plan :  Patient has history of severe hypersomnia to the point that she was falling asleep driving. The last 10 years or so, she has more insomnia. I think at the end of the day, she still has obstructive sleep apnea and her manifestation is insomnia. She had a sleep study in [place] in 2007 which was negative. She has multiple sclerosis and takes pain medications and benzodiazepines which can make her sleepy and make sleep apnea worse.  We discussed about the diagnosis of Obstructive Sleep Apnea (OSA) and implications of untreated OSA. We discussed about CPAP and BiPaP as possible treatment options.   We will schedule the patient for a sleep study.  Plan for a HST. Anticipate she will not have issues with CPAP. If she ends up being on CPAP, we need to address sleep habits and sleep hygiene. 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   Patient was instructed to call the office if he/she has not heard back from the office 1-2 weeks after the sleep study.   Patient was instructed to call  the office if he/she is having issues with the PAP device.   We discussed good sleep hygiene.   Patient was advised not to engage in activities requiring concentration and/or vigilance if he/she is sleepy.  Patient was advised not to drive if he/she is sleepy.

## 2015-11-20 DIAGNOSIS — E119 Type 2 diabetes mellitus without complications: Secondary | ICD-10-CM | POA: Diagnosis not present

## 2015-11-20 DIAGNOSIS — N3281 Overactive bladder: Secondary | ICD-10-CM | POA: Diagnosis not present

## 2015-11-20 DIAGNOSIS — E118 Type 2 diabetes mellitus with unspecified complications: Secondary | ICD-10-CM | POA: Diagnosis not present

## 2015-11-20 DIAGNOSIS — M797 Fibromyalgia: Secondary | ICD-10-CM | POA: Diagnosis not present

## 2015-11-23 DIAGNOSIS — Z23 Encounter for immunization: Secondary | ICD-10-CM | POA: Diagnosis not present

## 2015-11-23 DIAGNOSIS — M47812 Spondylosis without myelopathy or radiculopathy, cervical region: Secondary | ICD-10-CM | POA: Diagnosis not present

## 2015-11-23 DIAGNOSIS — M542 Cervicalgia: Secondary | ICD-10-CM | POA: Diagnosis not present

## 2015-11-26 DIAGNOSIS — M542 Cervicalgia: Secondary | ICD-10-CM | POA: Diagnosis not present

## 2015-11-26 DIAGNOSIS — G894 Chronic pain syndrome: Secondary | ICD-10-CM | POA: Diagnosis not present

## 2015-11-27 ENCOUNTER — Other Ambulatory Visit (INDEPENDENT_AMBULATORY_CARE_PROVIDER_SITE_OTHER): Payer: Self-pay | Admitting: Orthopaedic Surgery

## 2015-11-27 DIAGNOSIS — M542 Cervicalgia: Secondary | ICD-10-CM

## 2015-12-18 ENCOUNTER — Telehealth: Payer: Self-pay | Admitting: Pulmonary Disease

## 2015-12-18 ENCOUNTER — Ambulatory Visit
Admission: RE | Admit: 2015-12-18 | Discharge: 2015-12-18 | Disposition: A | Discharge status: Home / Self Care | Payer: Medicare Other | Source: Ambulatory Visit | Attending: Orthopaedic Surgery | Admitting: Orthopaedic Surgery

## 2015-12-18 DIAGNOSIS — M4802 Spinal stenosis, cervical region: Secondary | ICD-10-CM | POA: Diagnosis not present

## 2015-12-18 DIAGNOSIS — M542 Cervicalgia: Secondary | ICD-10-CM

## 2015-12-18 NOTE — Telephone Encounter (Signed)
Patient states that she had asthma diagnosis years ago that doctor believes was due to work environment.  She changed jobs and was able to get off the asthma medications.  She says that at night, she has air in her jaws and when she blows out, she feels a mist coming out.  She says that she does not have trouble breathing in air, but sometimes has trouble breathing out.  States that she has moments where she cannot catch her breath.   Scheduled patient to see Nickolas Madrid, NP tomorrow at 1030am.  Nothing further needed.

## 2015-12-19 ENCOUNTER — Encounter: Payer: Self-pay | Admitting: Adult Health

## 2015-12-19 ENCOUNTER — Telehealth: Payer: Self-pay | Admitting: Adult Health

## 2015-12-19 ENCOUNTER — Ambulatory Visit (INDEPENDENT_AMBULATORY_CARE_PROVIDER_SITE_OTHER): Payer: Medicare Other | Admitting: Adult Health

## 2015-12-19 VITALS — BP 124/78 | HR 73 | Ht 63.0 in | Wt 169.2 lb

## 2015-12-19 DIAGNOSIS — G4700 Insomnia, unspecified: Secondary | ICD-10-CM

## 2015-12-19 DIAGNOSIS — R0602 Shortness of breath: Secondary | ICD-10-CM | POA: Diagnosis not present

## 2015-12-19 MED ORDER — ALBUTEROL SULFATE HFA 108 (90 BASE) MCG/ACT IN AERS: 2.0000 | INHALATION_SPRAY | Freq: Four times a day (QID) | RESPIRATORY_TRACT | 6 refills | Status: DC | PRN

## 2015-12-19 MED ORDER — ALBUTEROL SULFATE HFA 108 (90 BASE) MCG/ACT IN AERS: 2.0000 | INHALATION_SPRAY | Freq: Four times a day (QID) | RESPIRATORY_TRACT | 3 refills | Status: DC | PRN

## 2015-12-19 NOTE — Progress Notes (Signed)
Chief Complaint  Patient presents with  . Acute Visit    Worried that her asthma may be flaring up. trouble breathing out, hard to catch her breath.  States she has trouble sleeping.  Feels like air is getting  "trapped".  once in a while gets a little tightness in chest.  some dry coughing, when she coughs it is because she gets choked on her own saliva.       Tests 10/22 split night sleep study>>>  Past medical hx Past Medical History:  Diagnosis Date  . Diabetes mellitus without complication ([place])   . Fibromyalgia   . Hypertension   . MS (multiple sclerosis) ([place])   . Vision abnormalities      Past surgical hx, Allergies, Family hx, Social hx all reviewed.  Current Outpatient Prescriptions on File Prior to Visit  Medication Sig  . alprazolam (XANAX) 2 MG tablet Take 2 mg by mouth 3 (three) times daily.  . Biotin 1000 MCG tablet Take 1,000 mcg by mouth daily.  Marland Kitchen BYDUREON 2 MG PEN Inject 2 mg as directed once a week.  . insulin aspart protamine- aspart (NOVOLOG MIX 70/30) (70-30) 100 UNIT/ML injection Inject 26-30 Units into the skin 2 (two) times daily with a meal. 26 units in am, 30 units in pm  . lidocaine (XYLOCAINE) 5 % ointment Apply 1 application topically daily as needed for mild pain.  Marland Kitchen oxyCODONE (OXYCONTIN) 10 mg 12 hr tablet Take 10 mg by mouth every 12 (twelve) hours.  Marland Kitchen oxyCODONE-acetaminophen (PERCOCET) 10-325 MG tablet Take 1 tablet by mouth 5 (five) times daily as needed.  . Telmisartan-Amlodipine 40-5 MG TABS Take 0.5 tablets by mouth daily.    No current facility-administered medications on file prior to visit.      Vital Signs BP 124/78 (BP Location: Left Arm, Cuff Size: Normal)   Pulse 73   Ht 5\' 3"  (1.6 m)   Wt 169 lb 3.2 oz (76.7 kg)   SpO2 97%   BMI 29.97 kg/m   History of Present Illness Courtney [name] is a 60 y.o. female former smoker (quit 20 years ago) with hx asthma r/t mold exposure but has been well controlled and has been  controled on only PRN albuterol for years and even that she uses rarely.  She has been followed by Dr. Corrie Dandy for insomnia and possible OSA.  She has not yet completed her home sleep study.   She presents today for acute OV with c/o SOB, occasional dry cough.     Happens mostly when she is asleep.  She wakes herself up having trouble exhaling and gasping at times.  Denies SOB at rest and really denies any SOB when she is awake.  She remains very tired and goes "days" without sleeping sometimes.  She is scheduled for her sleep study this Sunday night 10/22 Denies fevers, chills, purulent sputum, n/v/d, chest pain, no hemoptysis.  Some hoarse voice, intermittent.  No sick contacts.  No edema, no orthopnea.    Her MS has been stable x 6 mos.   Physical Exam  General - pleasant female, No distress  ENT - No sinus tenderness, no oral exudate, no LAN Cardiac - s1s2 regular, no murmur Chest - resps even non labored on RA, No wheeze/rales/dullness Back - No focal tenderness Abd - Soft, non-tender Ext - No edema Neuro - Normal strength Skin - No rashes Psych - normal mood, and behavior   Assessment/Plan  Insomnia  Hypersomnolence  SOB  - suspect this "SOB", gasping is r/t her suspected sleep apnea. It is infrequent.  No daytime SOB, cough, or DOE.  NO bronchospasm on exam. She is scheduled for sleep study this Sunday.   Patient Instructions  Will send a prescription for albuterol inhaler as needed  Go ahead with sleep study scheduled 10/22  Avoid oversedation with narcotics or benzos  F/u with Dr. Tennis Must dios 11/15 as previously scheduled  Please contact office for sooner follow up if symptoms do not improve or worsen or seek emergency care    Nickolas Madrid, NP 12/19/2015  10:55 AM

## 2015-12-19 NOTE — Patient Instructions (Addendum)
Will send a prescription for albuterol inhaler as needed  Go ahead with sleep study scheduled 10/22  Avoid oversedation with narcotics or benzos  F/u with Dr. Tennis Must dios 11/15 as previously scheduled  Please contact office for sooner follow up if symptoms do not improve or worsen or seek emergency care

## 2015-12-19 NOTE — Telephone Encounter (Signed)
Patient calling to get Albuterol inhaler sent to [facility].  Rx sent to [facility]. Called and left detailed message on voicemail advising patient that Rx was sent to [facility].  Nothing further needed.

## 2015-12-20 ENCOUNTER — Ambulatory Visit (INDEPENDENT_AMBULATORY_CARE_PROVIDER_SITE_OTHER): Payer: Medicare Other | Admitting: Orthopaedic Surgery

## 2015-12-20 DIAGNOSIS — M542 Cervicalgia: Secondary | ICD-10-CM

## 2015-12-20 DIAGNOSIS — M47812 Spondylosis without myelopathy or radiculopathy, cervical region: Secondary | ICD-10-CM

## 2015-12-23 ENCOUNTER — Ambulatory Visit (HOSPITAL_BASED_OUTPATIENT_CLINIC_OR_DEPARTMENT_OTHER): Payer: Medicare Other | Attending: Pulmonary Disease | Admitting: Pulmonary Disease

## 2015-12-23 VITALS — Ht 63.0 in | Wt 169.0 lb

## 2015-12-23 DIAGNOSIS — G4710 Hypersomnia, unspecified: Secondary | ICD-10-CM | POA: Insufficient documentation

## 2015-12-23 DIAGNOSIS — G4730 Sleep apnea, unspecified: Secondary | ICD-10-CM

## 2015-12-23 DIAGNOSIS — R0683 Snoring: Secondary | ICD-10-CM | POA: Insufficient documentation

## 2015-12-23 DIAGNOSIS — G4733 Obstructive sleep apnea (adult) (pediatric): Secondary | ICD-10-CM | POA: Diagnosis not present

## 2015-12-23 DIAGNOSIS — Z79899 Other long term (current) drug therapy: Secondary | ICD-10-CM | POA: Diagnosis not present

## 2015-12-26 ENCOUNTER — Telehealth: Payer: Self-pay | Admitting: Pulmonary Disease

## 2015-12-26 DIAGNOSIS — G4730 Sleep apnea, unspecified: Secondary | ICD-10-CM

## 2015-12-26 DIAGNOSIS — G4710 Hypersomnia, unspecified: Secondary | ICD-10-CM

## 2015-12-26 NOTE — Telephone Encounter (Signed)
   pls call the pt and tell her the [place] study was (-) for significant sleep apnea.  We can discuss the results on follow up next month.   Thanks.  Monica Becton, MD 12/26/2015, 1:11 AM [place] [facility] Pager [phone] After 3 pm or if no answer, call [phone]

## 2015-12-26 NOTE — Procedures (Signed)
    NAME: Mell Mellott DATE OF BIRTH:  1955-04-04 MEDICAL RECORD NUMBER [id number]  LOCATION: [facility]  PHYSICIAN: [place] OF STUDY: 12/23/2015    CLINICAL INFORMATION  Sleep Study Type: NPSG  Indication for sleep study: OSA  Epworth Sleepiness Score: 1   SLEEP STUDY TECHNIQUE  As per the AASM Manual for the Scoring of Sleep and Associated Events v2.3 (April 2016) with a hypopnea requiring 4% desaturations.  The channels recorded and monitored were frontal, central and occipital EEG, electrooculogram (EOG), submentalis EMG (chin), nasal and oral airflow, thoracic and abdominal wall motion, anterior tibialis EMG, snore microphone, electrocardiogram, and pulse oximetry.   MEDICATIONS  Medications self-administered by patient taken the night of the study : XANAX, PERCOCET, BIOTIN. meds reviewed per chart review.  SLEEP ARCHITECTURE  The study was initiated at 10:18:07 PM and ended at 4:36:38 AM.  Sleep onset time was 6.7 minutes and the sleep efficiency was 88.9%. The total sleep time was 336.3 minutes.  Stage REM latency was 286.5 minutes.  The patient spent 11.15% of the night in stage N1 sleep, 86.77% in stage N2 sleep, 0.00% in stage N3 and 2.08% in REM.  Alpha intrusion was absent.  Supine sleep was 61.99%.   RESPIRATORY PARAMETERS  The overall apnea/hypopnea index (AHI) was 0.9 per hour. There were 4 total apneas, including 1 obstructive, 3 central and 0 mixed apneas. There were 1 hypopneas and 8 RERAs.  The AHI during Stage REM sleep was 0.0 per hour. AHI while supine was 1.2 per hour.  The mean oxygen saturation was 91.49%. The minimum SpO2 during sleep was 88.00%.  Soft snoring was noted during this study.  CARDIAC DATA  The 2 lead EKG demonstrated sinus rhythm. The mean heart rate was 76.79 beats per minute. Other EKG findings include: None.   LEG MOVEMENT DATA  The total PLMS were 0 with a resulting PLMS index of  0.00. Associated arousal with leg movement index was 0.0 .  IMPRESSIONS  1. No significant obstructive sleep apnea occurred during this study (AHI = 0.9/h). 2. No significant central sleep apnea occurred during this study (CAI = 0.5/h). 3. The patient had minimal or no oxygen desaturation during the study (Min O2 = 88.00%) 4. The patient snored with Soft snoring volume. 5. No cardiac abnormalities were noted during this study. 6. Clinically significant periodic limb movements did not occur during sleep. No significant associated arousals.  DIAGNOSIS  No evidence for significant OSA based on this study.   RECOMMENDATIONS  1. Suggest obtaining a home sleep test to gather more data if OSA is still a main consideration. Patient had frequent awakenings during the study. 2. Suggest working up patient for other causes of hypersomnia such as medication related, or mental health issues, or related to poor sleep hygiene. Of note, patient takes opiates and benzodiazepenes which can cause hypersomnia. 3. Avoid alcohol, sedatives and other CNS depressants that may worsen sleep apnea and disrupt normal sleep architecture. 4. Sleep hygiene should be reviewed to assess factors that may improve sleep quality. 5. Weight management and regular exercise should be initiated or continued if appropriate. 6. Follow up in the offcie as scheduled.  Monica Becton, MD 12/26/2015, 1:09 AM [place] [facility] Pager [phone] After 3 pm or if no answer, call [phone]

## 2015-12-27 NOTE — Telephone Encounter (Signed)
Results have been explained to patient, pt expressed understanding. Nothing further needed.  

## 2016-01-04 ENCOUNTER — Ambulatory Visit (INDEPENDENT_AMBULATORY_CARE_PROVIDER_SITE_OTHER): Payer: Medicare Other | Admitting: Physical Medicine and Rehabilitation

## 2016-01-04 ENCOUNTER — Encounter (INDEPENDENT_AMBULATORY_CARE_PROVIDER_SITE_OTHER): Payer: Self-pay | Admitting: Physical Medicine and Rehabilitation

## 2016-01-04 DIAGNOSIS — R202 Paresthesia of skin: Secondary | ICD-10-CM

## 2016-01-04 DIAGNOSIS — M5412 Radiculopathy, cervical region: Secondary | ICD-10-CM

## 2016-01-04 NOTE — Procedures (Signed)
EMG & NCV Findings: All nerve conduction studies (as indicated in the following tables) were within normal limits.  All left vs. right side differences were within normal limits.    All examined muscles (as indicated in the following table) showed no evidence of electrical instability.    Impression: Essentially NORMAL electrodiagnostic study of both upper limbs.  There is no significant electrodiagnostic evidence of nerve entrapment, brachial plexopathy or cervical radiculopathy.    As you know, purely sensory or demyelinating radiculopathies and chemical radiculitis may not be detected with this particular electrodiagnostic study.  This would also not rule out small fiber neuropathy or dysesthesias from fibromyalgia.  Recommendations: 1.  Follow-up with referring physician. 2.  Continue current management of symptoms. 3.  Suggest C7-T1 interlaminar epidural.    Nerve Conduction Studies Anti Sensory Summary Table   Stim Site NR Peak (ms) Norm Peak (ms) P-T Amp (V) Norm P-T Amp Site1 Site2 Delta-P (ms) Dist (cm) Vel (m/s) Norm Vel (m/s)  Left Median Acr Palm Anti Sensory (2nd Digit)  32C  Wrist    3.3 <3.6 48.9 >10 Wrist Palm 1.6 0.0    Palm    1.7 <2.0 23.1         Right Median Acr Palm Anti Sensory (2nd Digit)  31.6C  Wrist    3.2 <3.6 50.0 >10 Wrist Palm 1.3 0.0    Palm    1.9 <2.0 29.5         Left Radial Anti Sensory (Base 1st Digit)  31.9C  Wrist    2.3 <3.1 14.8  Wrist Base 1st Digit 2.3 0.0    Right Radial Anti Sensory (Base 1st Digit)  31.5C  Wrist    2.4 <3.1 44.2  Wrist Base 1st Digit 2.4 0.0    Left Ulnar Anti Sensory (5th Digit)  32.1C  Wrist    3.6 <3.7 16.8 >15.0 Wrist 5th Digit 3.6 14.0 39 >38  Right Ulnar Anti Sensory (5th Digit)  31.5C  Wrist    3.6 <3.7 34.3 >15.0 Wrist 5th Digit 3.6 14.0 39 >38   Motor Summary Table   Stim Site NR Onset (ms) Norm Onset (ms) O-P Amp (mV) Norm O-P Amp Site1 Site2 Delta-0 (ms) Dist (cm) Vel (m/s) Norm Vel (m/s)  Left  Median Motor (Abd Poll Brev)  32.1C  Wrist    3.2 <4.2 11.4 >5 Elbow Wrist 3.4 18.5 54 >50  Elbow    6.6  8.8         Right Median Motor (Abd Poll Brev)  31.4C  Wrist    3.1 <4.2 13.9 >5 Elbow Wrist 3.6 18.5 51 >50  Elbow    6.7  7.6         Left Ulnar Motor (Abd Dig Min)  32.2C  Wrist    3.6 <4.2 12.3 >3 B Elbow Wrist 3.2 17.0 53 >53  B Elbow    6.8  10.8  A Elbow B Elbow 1.6 10.0 62 >53  A Elbow    8.4  10.4         Right Ulnar Motor (Abd Dig Min)  31.5C  Wrist    3.2 <4.2 13.9 >3 B Elbow Wrist 3.3 18.0 55 >53  B Elbow    6.5  12.2  A Elbow B Elbow 1.8 10.0 56 >53  A Elbow    8.3  7.0          EMG   Side Muscle Nerve Root Ins Act Fibs Psw Amp Dur Poly  Recrt Int Fraser Din Comment  Right 1stDorInt Ulnar C8-T1 Nml Nml Nml Nml Nml 0 Nml Nml   Right Abd Poll Brev Median C8-T1 Nml Nml Nml Nml Nml 0 Nml Nml   Right ExtDigCom   Nml Nml Nml Nml Nml 0 Nml Nml   Right Triceps Radial C6-7-8 Nml Nml Nml Nml Nml 0 Nml Nml   Right Deltoid Axillary C5-6 Nml Nml Nml Nml Nml 0 Nml Nml   Left 1stDorInt Ulnar C8-T1 Nml Nml Nml Nml Nml 0 Nml Nml   Left Abd Poll Brev Median C8-T1 Nml Nml Nml Nml Nml 0 Nml Nml   Left ExtDigCom   Nml Nml Nml Nml Nml 0 Nml Nml   Left Triceps Radial C6-7-8 Nml Nml Nml Nml Nml 0 Nml Nml   Left Deltoid Axillary C5-6 Nml Nml Nml Nml Nml 0 Nml Nml     Nerve Conduction Studies Anti Sensory Left/Right Comparison   Stim Site L Lat (ms) R Lat (ms) L-R Lat (ms) L Amp (V) R Amp (V) L-R Amp (%) Site1 Site2 L Vel (m/s) R Vel (m/s) L-R Vel (m/s)  Median Acr Palm Anti Sensory (2nd Digit)  32C  Wrist 3.3 3.2 0.1 48.9 50.0 2.2 Wrist Palm     Palm 1.7 1.9 0.2 23.1 29.5 21.7       Radial Anti Sensory (Base 1st Digit)  31.9C  Wrist 2.3 2.4 0.1 14.8 44.2 66.5 Wrist Base 1st Digit     Ulnar Anti Sensory (5th Digit)  32.1C  Wrist 3.6 3.6 0.0 16.8 34.3 51.0 Wrist 5th Digit 39 39 0   Motor Left/Right Comparison   Stim Site L Lat (ms) R Lat (ms) L-R Lat (ms) L Amp (mV) R Amp (mV) L-R  Amp (%) Site1 Site2 L Vel (m/s) R Vel (m/s) L-R Vel (m/s)  Median Motor (Abd Poll Brev)  32.1C  Wrist 3.2 3.1 0.1 11.4 13.9 18.0 Elbow Wrist 54 51 3  Elbow 6.6 6.7 0.1 8.8 7.6 13.6       Ulnar Motor (Abd Dig Min)  32.2C  Wrist 3.6 3.2 0.4 12.3 13.9 11.5 B Elbow Wrist 53 55 2  B Elbow 6.8 6.5 0.3 10.8 12.2 11.5 A Elbow B Elbow 62 56 6  A Elbow 8.4 8.3 0.1 10.4 7.0 32.7

## 2016-01-04 NOTE — Progress Notes (Signed)
Office Visit Note  Patient: Courtney [name]           Date of Birth: [birth year]           MRN: [id number] Visit Date: 01/04/2016              Requested by: Lucianne Lei, MD [address] [place], [place] [address] PCP: [name], MD   Assessment & Plan: Visit Diagnoses:  1. Paresthesia of skin   2. Cervical radiculopathy    Complicated pain history with chronic pain syndrome and severe fibromyalgia and with reported multiple sclerosis on medication. She is attempting to set up care while hearing [facility] but that seems to be going fairly slow. She does see a chronic pain management physician in town as prescribed opioids both long-acting and short-acting breakthrough. She would do better in a comprehensive pain management program with psychiatric help as well as coping strategies and medication. She has had since I've seen her the last time and new MRI with findings of C4-5 with spinal stenosis and disc herniation. This is worse than the previous MRI that we had to review. I think some of her pain is consistent with that a lot of her pain is inconsistent in his both myofascial and fibromyalgia. I did offer her cervical intralaminar epidural injection but did counsel her on the fact that this would not take all of her pain away by any stretch. I did have a long talk with her about this. We talked for probably 30 minutes just on this fact alone the total visit time was 45 minutes.. I think she does need to be set up with a rheumatologist and neurologist and she can do that through the [facility] system who she is trying to get up appointment with her primary care physician. Other than the epidural injection I really don't have much to offer at this point. Unfortunately just a very complicated patient at this point. She doesn't have any physical signs of weakness on exam. Electrodiagnostic study is normal.   Follow-Up Instructions: Return for Schedule C7-T1 interlaminar epidural  injection.  Orders:  Orders Placed This Encounter  Procedures  . NCV with EMG (electromyography)    No orders of the defined types were placed in this encounter.     Procedures: EMG & NCV Findings: All nerve conduction studies (as indicated in the following tables) were within normal limits.  All left vs. right side differences were within normal limits.    All examined muscles (as indicated in the following table) showed no evidence of electrical instability.    Impression: Essentially NORMAL electrodiagnostic study of both upper limbs.  There is no significant electrodiagnostic evidence of nerve entrapment, brachial plexopathy or cervical radiculopathy.    As you know, purely sensory or demyelinating radiculopathies and chemical radiculitis may not be detected with this particular electrodiagnostic study.  This would also not rule out small fiber neuropathy or dysesthesias from fibromyalgia.  Recommendations: 1.  Follow-up with referring physician. 2.  Continue current management of symptoms. 3.  Suggest C7-T1 interlaminar epidural.    Nerve Conduction Studies Anti Sensory Summary Table   Stim Site NR Peak (ms) Norm Peak (ms) P-T Amp (V) Norm P-T Amp Site1 Site2 Delta-P (ms) Dist (cm) Vel (m/s) Norm Vel (m/s)  Left Median Acr Palm Anti Sensory (2nd Digit)  32C  Wrist    3.3 <3.6 48.9 >10 Wrist Palm 1.6 0.0    Palm    1.7 <2.0  23.1         Right Median Acr Palm Anti Sensory (2nd Digit)  31.6C  Wrist    3.2 <3.6 50.0 >10 Wrist Palm 1.3 0.0    Palm    1.9 <2.0 29.5         Left Radial Anti Sensory (Base 1st Digit)  31.9C  Wrist    2.3 <3.1 14.8  Wrist Base 1st Digit 2.3 0.0    Right Radial Anti Sensory (Base 1st Digit)  31.5C  Wrist    2.4 <3.1 44.2  Wrist Base 1st Digit 2.4 0.0    Left Ulnar Anti Sensory (5th Digit)  32.1C  Wrist    3.6 <3.7 16.8 >15.0 Wrist 5th Digit 3.6 14.0 39 >38  Right Ulnar Anti Sensory (5th Digit)  31.5C  Wrist    3.6 <3.7 34.3 >15.0  Wrist 5th Digit 3.6 14.0 39 >38   Motor Summary Table   Stim Site NR Onset (ms) Norm Onset (ms) O-P Amp (mV) Norm O-P Amp Site1 Site2 Delta-0 (ms) Dist (cm) Vel (m/s) Norm Vel (m/s)  Left Median Motor (Abd Poll Brev)  32.1C  Wrist    3.2 <4.2 11.4 >5 Elbow Wrist 3.4 18.5 54 >50  Elbow    6.6  8.8         Right Median Motor (Abd Poll Brev)  31.4C  Wrist    3.1 <4.2 13.9 >5 Elbow Wrist 3.6 18.5 51 >50  Elbow    6.7  7.6         Left Ulnar Motor (Abd Dig Min)  32.2C  Wrist    3.6 <4.2 12.3 >3 B Elbow Wrist 3.2 17.0 53 >53  B Elbow    6.8  10.8  A Elbow B Elbow 1.6 10.0 62 >53  A Elbow    8.4  10.4         Right Ulnar Motor (Abd Dig Min)  31.5C  Wrist    3.2 <4.2 13.9 >3 B Elbow Wrist 3.3 18.0 55 >53  B Elbow    6.5  12.2  A Elbow B Elbow 1.8 10.0 56 >53  A Elbow    8.3  7.0          EMG   Side Muscle Nerve Root Ins Act Fibs Psw Amp Dur Poly Recrt Int Fraser Din Comment  Right 1stDorInt Ulnar C8-T1 Nml Nml Nml Nml Nml 0 Nml Nml   Right Abd Poll Brev Median C8-T1 Nml Nml Nml Nml Nml 0 Nml Nml   Right ExtDigCom   Nml Nml Nml Nml Nml 0 Nml Nml   Right Triceps Radial C6-7-8 Nml Nml Nml Nml Nml 0 Nml Nml   Right Deltoid Axillary C5-6 Nml Nml Nml Nml Nml 0 Nml Nml   Left 1stDorInt Ulnar C8-T1 Nml Nml Nml Nml Nml 0 Nml Nml   Left Abd Poll Brev Median C8-T1 Nml Nml Nml Nml Nml 0 Nml Nml   Left ExtDigCom   Nml Nml Nml Nml Nml 0 Nml Nml   Left Triceps Radial C6-7-8 Nml Nml Nml Nml Nml 0 Nml Nml   Left Deltoid Axillary C5-6 Nml Nml Nml Nml Nml 0 Nml Nml     Nerve Conduction Studies Anti Sensory Left/Right Comparison   Stim Site L Lat (ms) R Lat (ms) L-R Lat (ms) L Amp (V) R Amp (V) L-R Amp (%) Site1 Site2 L Vel (m/s) R Vel (m/s) L-R Vel (m/s)  Median Acr Palm Anti Sensory (2nd Digit)  32C  Wrist 3.3 3.2 0.1 48.9 50.0 2.2 Wrist Palm     Palm 1.7 1.9 0.2 23.1 29.5 21.7       Radial Anti Sensory (Base 1st Digit)  31.9C  Wrist 2.3 2.4 0.1 14.8 44.2 66.5 Wrist Base 1st Digit     Ulnar Anti  Sensory (5th Digit)  32.1C  Wrist 3.6 3.6 0.0 16.8 34.3 51.0 Wrist 5th Digit 39 39 0   Motor Left/Right Comparison   Stim Site L Lat (ms) R Lat (ms) L-R Lat (ms) L Amp (mV) R Amp (mV) L-R Amp (%) Site1 Site2 L Vel (m/s) R Vel (m/s) L-R Vel (m/s)  Median Motor (Abd Poll Brev)  32.1C  Wrist 3.2 3.1 0.1 11.4 13.9 18.0 Elbow Wrist 54 51 3  Elbow 6.6 6.7 0.1 8.8 7.6 13.6       Ulnar Motor (Abd Dig Min)  32.2C  Wrist 3.6 3.2 0.4 12.3 13.9 11.5 B Elbow Wrist 53 55 2  B Elbow 6.8 6.5 0.3 10.8 12.2 11.5 A Elbow B Elbow 62 56 6  A Elbow 8.4 8.3 0.1 10.4 7.0 32.7          Other Procedures: No procedures performed   Clinical Data: Findings:  Lspine MRI 12/18/2015 IMPRESSION: 1. No acute osseous abnormality identified in the cervical spine. No explanation identified for left side pain radiating to the ear. 2. Disc and endplate degeneration at C4-C5 eccentric to the right resulting in spinal stenosis with spinal cord mass effect and moderate to severe right C5 foraminal stenosis. No associated cervical spinal cord signal abnormality. 3. Bulky left anterolateral disc osteophyte complex at C6-C7, but no associated spinal stenosis or neural impingement.    Subjective: No chief complaint on file.   HPI   Mrs. Conley-KeyesIs a 60 year old female with complicated chronic pain history. She carries a diagnosis of multiple sclerosis. She has not seen a neurologist here in town for that condition since she moved. She had enough medications evidently from her prior neurologist that she still looking for that. She is seen a primary care physician at [facility] which she'll start very soon. She is trying to get set up in the office. She also currently sees a chronic pain management physician in town. She is taking OxyContin and breakthrough oxycodone with no relief of her symptoms at all. She's had a history of severe fibromyalgia which she feels is not being treated adequately. She has seen a  rheumatologist at one point but not recently. She has chronic pain which is mostly neck pain with referral into the shoulders and arms. She has a nondermatomal distribution of paresthesias. In fact she has paresthesias and tingling in all areas of the body including the arms legs face and lips. Her left neck pain is worse than the right. Radiates up into the ear area. This seems to be with rotation and extension more of a myofascial distribution. Dr. Durward Fortes has been following her and we completed evaluation of her at one point. The evaluation at that time he did not think there was a issue her an epidural injection would help but we did suggest possibly facet joint blocks based on the imaging. She has since had an MRI of the cervical spine is actually is a lot worse than we saw her previous MRI which was done in [place]. This new MRI was reviewed with her and shallow and the finding section of this note. He does show disc herniation with some spinal stenosis. She feels weak  in general but no focal weakness. She does have associated headaches. She has all over body pain at this point and really poor coping strategies with the amount of pain that she is having.  Bilateral Upper Extremity Right Hand Dominant Neck pain going into ears. Arm pain both sides. Left elbow pain. Numbness and tingling in arms and whole hand, all fingers. Reports numbness all over body as well. Review of Systems  Constitutional: Positive for fatigue. Negative for chills, fever and unexpected weight change.  HENT: Positive for facial swelling. Negative for sore throat and trouble swallowing.   Eyes: Negative for photophobia and visual disturbance.  Respiratory: Negative for chest tightness and shortness of breath.   Cardiovascular: Negative for chest pain.  Gastrointestinal: Negative for abdominal pain.  Endocrine: Negative for cold intolerance and heat intolerance.  Musculoskeletal: Negative for myalgias.  Skin:  Negative for color change and rash.  Neurological: Positive for numbness. Negative for speech difficulty and headaches.  Psychiatric/Behavioral: Positive for decreased concentration. Negative for confusion. The patient is not nervous/anxious.   All other systems reviewed and are negative.    Objective: Vital Signs: There were no vitals taken for this visit.   Physical Exam  Constitutional: She is oriented to person, place, and time. She appears well-developed and well-nourished.  Eyes: Conjunctivae and EOM are normal. Pupils are equal, round, and reactive to light.  Cardiovascular: Normal rate and intact distal pulses.   Pulmonary/Chest: Effort normal.  Neurological: She is alert and oriented to person, place, and time.  Skin: Skin is warm and dry. No erythema.  Psychiatric: She has a normal mood and affect. Her behavior is normal.  Nursing note and vitals reviewed.  Cervical spine range of motion is limited in all directions.  She has positive tender points across the paraspinal trapezius and posterior deltoid regions. She has focal trigger points on the left and levator scapula and trapezius. Shoulder range of motion is limited by patient effort.  There is a negative Spurling's test bilaterally.  Patient has 5 / 5 strength in all muscle groups of the upper extremities bilaterally without deficits.  The patient has a negative Hoffman's test bilaterally.  There is no atrophy in the FDI or APB muscles bilaterally. There is no swelling edema or color changes. There is no allodynia. There is a negative Tinel's test bilaterally.  Ortho Exam  Specialty Comments:  No specialty comments available. Imaging: No results found.   PMFS History: Patient Active Problem List   Diagnosis Date Noted  . Hypersomnia 11/09/2015  . Diabetes mellitus ([place]) 07/18/2015  . Essential hypertension 07/18/2015  . Abnormal ECG 07/18/2015  . Multiple sclerosis ([place]) 02/09/2015  . Gait difficulty 02/09/2015    . Fibromyalgia 02/09/2015  . Depression with anxiety 02/09/2015  . Numbness 02/09/2015  . Insomnia 02/09/2015  . Urinary hesitancy 02/09/2015  . Other fatigue 02/09/2015   Past Medical History:  Diagnosis Date  . Diabetes mellitus without complication ([place])   . Fibromyalgia   . Hypertension   . MS (multiple sclerosis) ([place])   . Vision abnormalities     Family History  Problem Relation Age of Onset  . Heart disease Mother   . Kidney disease Mother   . Other Father   . Multiple sclerosis Daughter   . Multiple sclerosis Other    Past Surgical History:  Procedure Laterality Date  . ABDOMINAL HYSTERECTOMY    . APPENDECTOMY    . CHOLECYSTECTOMY    . COLON SURGERY    .  CTR    . KNEE SURGERY     Social History   Occupational History  . Not on file.   Social History Main Topics  . Smoking status: Former Smoker    Packs/day: 0.25    Years: 5.00    Types: Cigarettes    Quit date: 12/19/1995  . Smokeless tobacco: Never Used  . Alcohol use Yes     Comment: socially only  . Drug use: No  . Sexual activity: Not on file

## 2016-01-07 ENCOUNTER — Encounter (INDEPENDENT_AMBULATORY_CARE_PROVIDER_SITE_OTHER): Payer: Self-pay | Admitting: Physical Medicine and Rehabilitation

## 2016-01-08 ENCOUNTER — Ambulatory Visit (INDEPENDENT_AMBULATORY_CARE_PROVIDER_SITE_OTHER): Payer: Medicare Other | Admitting: Nurse Practitioner

## 2016-01-08 ENCOUNTER — Encounter: Payer: Self-pay | Admitting: Nurse Practitioner

## 2016-01-08 ENCOUNTER — Other Ambulatory Visit (INDEPENDENT_AMBULATORY_CARE_PROVIDER_SITE_OTHER): Payer: Medicare Other

## 2016-01-08 VITALS — BP 126/78 | HR 86 | Temp 97.4°F | Ht 63.0 in | Wt 172.0 lb

## 2016-01-08 DIAGNOSIS — R5383 Other fatigue: Secondary | ICD-10-CM | POA: Diagnosis not present

## 2016-01-08 DIAGNOSIS — I10 Essential (primary) hypertension: Secondary | ICD-10-CM

## 2016-01-08 DIAGNOSIS — G35 Multiple sclerosis: Secondary | ICD-10-CM

## 2016-01-08 DIAGNOSIS — E118 Type 2 diabetes mellitus with unspecified complications: Secondary | ICD-10-CM

## 2016-01-08 DIAGNOSIS — Z794 Long term (current) use of insulin: Secondary | ICD-10-CM

## 2016-01-08 DIAGNOSIS — R269 Unspecified abnormalities of gait and mobility: Secondary | ICD-10-CM

## 2016-01-08 DIAGNOSIS — F418 Other specified anxiety disorders: Secondary | ICD-10-CM

## 2016-01-08 DIAGNOSIS — M797 Fibromyalgia: Secondary | ICD-10-CM | POA: Diagnosis not present

## 2016-01-08 LAB — COMPREHENSIVE METABOLIC PANEL
ALBUMIN: 4.7 g/dL (ref 3.5–5.2)
ALT: 30 U/L (ref 0–35)
AST: 29 U/L (ref 0–37)
Alkaline Phosphatase: 77 U/L (ref 39–117)
BILIRUBIN TOTAL: 0.5 mg/dL (ref 0.2–1.2)
BUN: 14 mg/dL (ref 6–23)
CALCIUM: 9.7 mg/dL (ref 8.4–10.5)
CHLORIDE: 101 meq/L (ref 96–112)
CO2: 30 mEq/L (ref 19–32)
CREATININE: 0.96 mg/dL (ref 0.40–1.20)
GFR: 75.97 mL/min (ref >60.00)
Glucose, Bld: 143 mg/dL — ABNORMAL HIGH (ref 70–99)
Potassium: 3.8 mEq/L (ref 3.5–5.1)
Sodium: 139 mEq/L (ref 135–145)
Total Protein: 7.9 g/dL (ref 6.0–8.3)

## 2016-01-08 LAB — MICROALBUMIN / CREATININE URINE RATIO
Creatinine,U: 70.3 mg/dL
Microalb Creat Ratio: 1 mg/g (ref 0.0–30.0)

## 2016-01-08 LAB — TSH: TSH: 1.55 u[IU]/mL (ref 0.35–4.50)

## 2016-01-08 LAB — HEMOGLOBIN A1C: HEMOGLOBIN A1C: 7.3 % — AB (ref 4.6–6.5)

## 2016-01-08 MED ORDER — INSULIN ASPART PROT & ASPART (70-30 MIX) 100 UNIT/ML ~~LOC~~ SUSP: 26.0000 [IU] | Freq: Two times a day (BID) | SUBCUTANEOUS | 0 refills | Status: DC

## 2016-01-08 NOTE — Progress Notes (Signed)
Subjective:  Patient ID: Courtney [name], female    DOB: 10-05-1955  Age: 60 y.o. MRN: [id number]  CC: Establish Care (Pt stated need new PCP and having body ache)   HPI Moved from [place] to [place] 1year ago.  Previous pcp was Dr. Beatrix Shipper. Last seen 45month ago.  Last Tetanus: 2017 per patient, records requested.  Last pneumovax: 2012 and 2013 per patient, records requested.  Last PAP: 11/2015: normal per patient, record requested.  Last mammogram: 2017, normal per patient, records requested.  MS and Fibromyalgia:  Previously seen by neurologist Dr. SFelecia Shellingfor multiple sclerosis. Last seen 02/2015 Patient is requesting another neurologist referral.  Dm:  does not check CBG at home. Report hypoglycemic episode (weakness and light headedness, tremors, glucose of 50s-70s). Takes bydureon 2549mand novolog 70/30 24units BID. Up to date on eye exam, records requested. Last foot exam 3years ago. Previous medications tried are metformin, byetta, januvia, and actos.  HTN: Has not taken BP medication for last 49m85monthChronic neck and back pain: [facility] [place] Pul[facility]exene [facility] Medications Prior to Visit  Medication Sig Dispense Refill  . albuterol (PROVENTIL HFA;VENTOLIN HFA) 108 (90 Base) MCG/ACT inhaler Inhale 2 puffs into the lungs every 6 (six) hours as needed for wheezing or shortness of breath. 3 Inhaler 3  . alprazolam (XANAX) 2 MG tablet Take 2 mg by mouth 3 (three) times daily.    . Biotin 1000 MCG tablet Take 1,000 mcg by mouth daily.    . BMarland KitchenDUREON 2 MG PEN Inject 2 mg as directed once a week.  5  . cyclobenzaprine (FLEXERIL) 5 MG tablet Take 1 tablet by mouth twice daily as needed for spasms  2  . interferon beta-1a (AVONEX) 30 MCG/0.5ML PSKT injection Inject 30 mcg into the muscle every 7 (seven) days.    . lMarland Kitchendocaine (XYLOCAINE) 5 % ointment Apply 1 application topically daily as needed for mild pain.    . oMarland KitchenyCODONE (OXYCONTIN) 10  mg 12 hr tablet Take 10 mg by mouth every 12 (twelve) hours.    . oMarland KitchenyCODONE-acetaminophen (PERCOCET) 10-325 MG tablet Take 1 tablet by mouth 5 (five) times daily as needed.  0  . Telmisartan-Amlodipine 40-5 MG TABS Take 0.5 tablets by mouth daily.     . insulin aspart protamine- aspart (NOVOLOG MIX 70/30) (70-30) 100 UNIT/ML injection Inject 26-30 Units into the skin 2 (two) times daily with a meal. 20 units in am, 26 units in pm      No facility-administered medications prior to visit.    Family History  Problem Relation Age of Onset  . Heart disease Mother   . Kidney disease Mother   . Diabetes Mother   . Other Father   . Diabetes Father   . Multiple sclerosis Daughter   . Multiple sclerosis Other   . Diabetes Sister   . Alcohol abuse Brother     ETOH and marijuana  . Diabetes Brother    Social History  Substance Use Topics  . Smoking status: Former Smoker    Packs/day: 0.25    Years: 5.00    Types: Cigarettes    Quit date: 12/19/1995  . Smokeless tobacco: Never Used  . Alcohol use Yes     Comment: socially only   ROS Review of Systems  Constitutional: Positive for malaise/fatigue.       Chronic fatigue and LE weakness.  HENT: Negative.   Respiratory: Negative.   Cardiovascular: Negative.  Gastrointestinal: Negative.   Genitourinary: Negative.   Musculoskeletal: Positive for back pain, myalgias and neck pain.       Chronic neck and back pain.  Skin: Negative.   Neurological: Positive for tingling, sensory change and weakness.  Psychiatric/Behavioral: Positive for depression. Negative for hallucinations, memory loss, substance abuse and suicidal ideas. The patient is nervous/anxious and has insomnia.     Objective:  BP 126/78 (BP Location: Left Arm, Patient Position: Sitting, Cuff Size: Normal)   Pulse 86   Temp 97.4 F (36.3 C)   Ht _0  (1.6 m)   Wt 172 lb (78 kg)   SpO2 98%   BMI 30.47 kg/m   BP Readings from Last 3 Encounters:  01/08/16 126/78    12/19/15 124/78  11/09/15 102/62    Wt Readings from Last 3 Encounters:  01/08/16 172 lb (78 kg)  12/23/15 169 lb (76.7 kg)  12/19/15 169 lb 3.2 oz (76.7 kg)    Physical Exam  Constitutional: She is oriented to person, place, and time. No distress.  HENT:  Right Ear: External ear normal.  Left Ear: External ear normal.  Nose: Nose normal.  Mouth/Throat: Oropharynx is clear and moist. No oropharyngeal exudate.  Eyes: No scleral icterus.  Neck: Normal range of motion. Neck supple. No JVD present. No thyromegaly present.  Cardiovascular: Normal rate, regular rhythm and normal heart sounds.   Pulmonary/Chest: Effort normal and breath sounds normal.  Abdominal: Soft. Bowel sounds are normal.  Musculoskeletal: Normal range of motion. She exhibits no edema.  Lymphadenopathy:    She has no cervical adenopathy.  Neurological: She is alert and oriented to person, place, and time.  Skin: Skin is warm and dry.  Vitals reviewed.   Lab Results  Component Value Date   WBC 4.2 02/03/2015   HGB 11.9 (L) 02/03/2015   HCT 35.8 (L) 02/03/2015   PLT 147 (L) 02/03/2015   GLUCOSE 143 (H) 01/08/2016   ALT 30 01/08/2016   AST 29 01/08/2016   NA 139 01/08/2016   K 3.8 01/08/2016   CL 101 01/08/2016   CREATININE 0.96 01/08/2016   BUN 14 01/08/2016   CO2 30 01/08/2016   TSH 1.55 01/08/2016   HGBA1C 7.3 (H) 01/08/2016   MICROALBUR <0.7 01/08/2016    Mr Cervical Spine Wo Contrast  Result Date: 12/18/2015 CLINICAL DATA:  60 year old female with severe neck pain for several months radiating to the left side, left ear. Known cervical disc degeneration. Initial encounter. EXAM: MRI CERVICAL SPINE WITHOUT CONTRAST TECHNIQUE: Multiplanar, multisequence MR imaging of the cervical spine was performed. No intravenous contrast was administered. COMPARISON:  Cervical spine radiographs 07/02/2015 FINDINGS: Alignment: Straightening of cervical lordosis appears stable. Vertebrae: No marrow edema or  evidence of acute osseous abnormality. Cord: Spinal cord signal is within normal limits at all visualized levels. Posterior Fossa, vertebral arteries, paraspinal tissues: Preserved major vascular flow voids in the neck. Negative visible neck soft tissues. Negative visible brain parenchyma. Disc levels: C2-C3:  Negative. C3-C4: Mild circumferential disc bulge with broad-based central posterior component. No significant stenosis. C4-C5: Disc space loss. Circumferential disc osteophyte complex with moderate-sized broad-based right paracentral component (series 6, image 12). Spinal stenosis with mild spinal cord mass effect. Involvement of the right neural foramen with moderate to severe right C5 foraminal stenosis. C5-C6: Circumferential disc osteophyte complex with mild broad-based posterior component. No significant stenosis. C6-C7: Circumferential disc osteophyte complex with broad-based right eccentric posterior component and incidentally very bulky left anterolateral osteophytosis (series 6, image 20).  No significant stenosis. C7-T1:  Negative. No upper thoracic spinal or foraminal stenosis. IMPRESSION: 1. No acute osseous abnormality identified in the cervical spine. No explanation identified for left side pain radiating to the ear. 2. Disc and endplate degeneration at C4-C5 eccentric to the right resulting in spinal stenosis with spinal cord mass effect and moderate to severe right C5 foraminal stenosis. No associated cervical spinal cord signal abnormality. 3. Bulky left anterolateral disc osteophyte complex at C6-C7, but no associated spinal stenosis or neural impingement. Electronically Signed   By: Genevie Ann M.D.   On: 12/18/2015 15:44    Assessment & Plan:   Courtney was seen today for establish care.  Diagnoses and all orders for this visit:  Essential hypertension -     Comp Met (CMET); Future -     Cancel: CBC w/Diff; Future  Type 2 diabetes mellitus with complication, with long-term current use  of insulin (HCC) -     Hemoglobin A1c; Future -     Microalbumin / creatinine urine ratio; Future -     insulin aspart protamine- aspart (NOVOLOG MIX 70/30) (70-30) 100 UNIT/ML injection; Inject 0.26-0.3 mLs (26-30 Units total) into the skin 2 (two) times daily with a meal. 20 units in am, 26 units in pm  Multiple sclerosis ([place]) -     Ambulatory referral to Rheumatology  Fibromyalgia -     Ambulatory referral to Neurology  Gait difficulty -     Ambulatory referral to Neurology  Depression with anxiety -     TSH; Future  Other fatigue -     TSH; Future   I have changed Ms. Conley [name]'s insulin aspart protamine- aspart. I am also having her maintain her alprazolam, lidocaine, Biotin, Telmisartan-Amlodipine, oxyCODONE, BYDUREON, oxyCODONE-acetaminophen, interferon beta-1a, albuterol, and cyclobenzaprine.  Meds ordered this encounter  Medications  . insulin aspart protamine- aspart (NOVOLOG MIX 70/30) (70-30) 100 UNIT/ML injection    Sig: Inject 0.26-0.3 mLs (26-30 Units total) into the skin 2 (two) times daily with a meal. 20 units in am, 26 units in pm    Dispense:  10 mL    Refill:  0    Order Specific Question:   Supervising Provider    Answer:   Cassandria Anger [[id number]]   Recent Results (from the past 2160 hour(s))  Comp Met (CMET)     Status: Abnormal   Collection Time: 01/08/16 11:08 AM  Result Value Ref Range   Sodium 139 135 - 145 mEq/L   Potassium 3.8 3.5 - 5.1 mEq/L   Chloride 101 96 - 112 mEq/L   CO2 30 19 - 32 mEq/L   Glucose, Bld 143 (H) 70 - 99 mg/dL   BUN 14 6 - 23 mg/dL   Creatinine, Ser 0.96 0.40 - 1.20 mg/dL   Total Bilirubin 0.5 0.2 - 1.2 mg/dL   Alkaline Phosphatase 77 39 - 117 U/L   AST 29 0 - 37 U/L   ALT 30 0 - 35 U/L   Total Protein 7.9 6.0 - 8.3 g/dL   Albumin 4.7 3.5 - 5.2 g/dL   Calcium 9.7 8.4 - 10.5 mg/dL   GFR 75.97 >60.00 mL/min  Hemoglobin A1c     Status: Abnormal   Collection Time: 01/08/16 11:08 AM  Result Value Ref Range    Hgb A1c MFr Bld 7.3 (H) 4.6 - 6.5 %    Comment: Glycemic Control Guidelines for People with Diabetes:Non Diabetic:  <6%Goal of Therapy: <7%Additional Action Suggested:  >8%  TSH     Status: None   Collection Time: 01/08/16 11:08 AM  Result Value Ref Range   TSH 1.55 0.35 - 4.50 uIU/mL  Microalbumin / creatinine urine ratio     Status: None   Collection Time: 01/08/16 11:08 AM  Result Value Ref Range   Microalb, Ur <0.7 0.0 - 1.9 mg/dL   Creatinine,U 70.3 mg/dL   Microalb Creat Ratio 1.0 0.0 - 30.0 mg/g   Follow-up: Return in about 1 month (around 02/07/2016) for DM and HTN, fatigue.  Wilfred Lacy, NP

## 2016-01-08 NOTE — Patient Instructions (Signed)
Sign medical records to get records from previous pcp  Go to basement for labs. You will be called with lab results.  Check blood sugar twice a day (before breakfast and at bedtime) and record Bring blood sugar records to next office visit.   Hypoglycemia Low blood sugar (hypoglycemia) means that the level of sugar in your blood is lower than it should be. Signs of low blood sugar include:  Getting sweaty.  Feeling hungry.  Feeling dizzy or weak.  Feeling sleepier than normal.  Feeling nervous.  Headaches.  Having a fast heartbeat. Low blood sugar can happen fast and can be an emergency. Your doctor can do tests to check your blood sugar level. You can have low blood sugar and not have diabetes. HOME CARE  Check your blood sugar as told by your doctor. If it is less than 70 mg/dl or as told by your doctor, take 1 of the following:  3 to 4 glucose tablets.   cup clear juice.   cup soda pop, not diet.  1 cup milk.  5 to 6 hard candies.  Recheck blood sugar after 15 minutes. Repeat until it is at the right level.  Eat a snack if it is more than 1 hour until the next meal.  Only take medicine as told by your doctor.  Do not skip meals. Eat on time.  Do not drink alcohol except with meals.  Check your blood glucose before driving.  Check your blood glucose before and after exercise.  Always carry treatment with you, such as glucose pills.  Always wear a medical alert bracelet if you have diabetes. GET HELP RIGHT AWAY IF:   Your blood glucose goes below 70 mg/dl or as told by your doctor, and you:  Are confused.  Are not able to swallow.  Pass out (faint).  You cannot treat yourself. You may need someone to help you.  You have low blood sugar problems often.  You have problems from your medicines.  You are not feeling better after 3 to 4 days.  You have vision changes. MAKE SURE YOU:   Understand these instructions.  Will watch this  condition.  Will get help right away if you are not doing well or get worse.   This information is not intended to replace advice given to you by your health care provider. Make sure you discuss any questions you have with your health care provider.   Document Released: 05/14/2009 Document Revised: 03/10/2014 Document Reviewed: 10/24/2014 Elsevier Interactive Patient Education [employer].

## 2016-01-08 NOTE — Progress Notes (Signed)
Pre visit review using our clinic review tool, if applicable. No additional management support is needed unless otherwise documented below in the visit note. 

## 2016-01-10 ENCOUNTER — Encounter (INDEPENDENT_AMBULATORY_CARE_PROVIDER_SITE_OTHER): Payer: Self-pay | Admitting: Physical Medicine and Rehabilitation

## 2016-01-10 ENCOUNTER — Ambulatory Visit (INDEPENDENT_AMBULATORY_CARE_PROVIDER_SITE_OTHER): Payer: Medicare Other | Admitting: Physical Medicine and Rehabilitation

## 2016-01-10 VITALS — BP 125/81 | HR 77 | Temp 97.9°F

## 2016-01-10 DIAGNOSIS — M5010 Cervical disc disorder with radiculopathy, unspecified cervical region: Secondary | ICD-10-CM | POA: Diagnosis not present

## 2016-01-10 MED ORDER — LIDOCAINE HCL (PF) 1 % IJ SOLN
0.3300 mL | Freq: Once | INTRAMUSCULAR | Status: AC
  Administered 2016-01-10: 0.3 mL

## 2016-01-10 MED ORDER — METHYLPREDNISOLONE ACETATE 80 MG/ML IJ SUSP
80.0000 mg | Freq: Once | INTRAMUSCULAR | Status: AC
  Administered 2016-01-10: 80 mg

## 2016-01-10 NOTE — Progress Notes (Signed)
[name] - 60 y.o. female MRN [id number]  Date of birth: 1955/09/25  Office Visit Note: Visit Date: 01/10/2016 PCP: Wilfred Lacy, NP Referred by: Lucianne Lei, MD  Subjective: Chief Complaint  Patient presents with  . Neck - Pain   HPI: Courtney [name] is a pleasant 60 year old female with complicated case of fibromyalgia and multiple sclerosis as well as cervical herniated disc. Patient is here today for planned C7-T1 IL injection. States she has had no change in symptoms.  Please see our prior evaluation and management note for further details and justification.  Has driver with her today She is on no blood thinners and no dye allergy    ROS Otherwise per HPI.  Assessment & Plan: Visit Diagnoses:  1. Cervical disc disorder with radiculopathy     Plan: Findings:  See history of present illness. Patient here for planned cervical epidural injection.    Meds & Orders:  Meds ordered this encounter  Medications  . lidocaine (PF) (XYLOCAINE) 1 % injection 0.3 mL  . methylPREDNISolone acetate (DEPO-MEDROL) injection 80 mg    Orders Placed This Encounter  Procedures  . Epidural Steroid injection    Follow-up: Return if symptoms worsen or fail to improve.   Procedures: No procedures performed  Cervical Epidural Steroid Injection - Interlaminar Approach with Fluoroscopic Guidance  Patient: Courtney [name]      Date of Birth: 07/11/1955 MRN: [id number] PCP: Wilfred Lacy, NP      Visit Date: 01/10/2016   Universal Protocol:    Date/Time: 11/09/173:19 PM  Consent Given By: the patient  Position: PRONE  Additional Comments: Vital signs were monitored before and after the procedure. Patient was prepped and draped in the usual sterile fashion. The correct patient, procedure, and site was verified.   Injection Procedure Details:  Procedure Site One Meds Administered:  Meds ordered this encounter  Medications  . lidocaine (PF) (XYLOCAINE) 1 %  injection 0.3 mL  . methylPREDNISolone acetate (DEPO-MEDROL) injection 80 mg     Laterality: Right  Location/Site:  C7-T1  Needle size: 20 G  Needle type: Touhy  Needle Placement: Paramedian epidural space  Findings:  -Contrast Used: 2 mL iohexol 180 mg iodine/mL   -Comments: Excellent flow of contrast into the epidural space.  Procedure Details: Using a paramedian approach from the side mentioned above, the region overlying the inferior lamina was localized under fluoroscopic visualization and the soft tissues overlying this structure were infiltrated with 4 ml. of 1% Lidocaine without Epinephrine. A # 20 gauge, Tuohy needle was inserted into the epidural space using a paramedian approach.  The epidural space was localized using loss of resistance along with lateral and contralateral oblique bi-planar fluoroscopic views.  After negative aspirate for air, blood, and CSF, a 2 ml. volume of Isovue-250 was injected into the epidural space and the flow of contrast was observed. Radiographs were obtained for documentation purposes.   The injectate was administered into the level noted above.  Additional Comments:  The patient tolerated the procedure well Dressing: Band-Aid    Post-procedure details: Patient was observed during the procedure. Post-procedure instructions were reviewed.  Patient left the clinic in stable condition.        Clinical History: No specialty comments available.  She reports that she quit smoking about 20 years ago. Her smoking use included Cigarettes. She has a 1.25 pack-year smoking history. She has never used smokeless tobacco.   Recent Labs  01/08/16 1108  HGBA1C 7.3*  Objective:  VS:  HT:    WT:   BMI:     BP:125/81  HR:77bpm  TEMP:97.9 F (36.6 C)(Oral)  RESP:96 % Physical Exam  Musculoskeletal:  Good distal upper extremity strength bilaterally.    Ortho Exam Imaging: No results found.  Past Medical/Family/Surgical/Social  History: Medications & Allergies reviewed per EMR Patient Active Problem List   Diagnosis Date Noted  . Hypersomnia 11/09/2015  . Diabetes mellitus ([place]) 07/18/2015  . Essential hypertension 07/18/2015  . Abnormal ECG 07/18/2015  . Multiple sclerosis ([place]) 02/09/2015  . Gait difficulty 02/09/2015  . Fibromyalgia 02/09/2015  . Depression with anxiety 02/09/2015  . Numbness 02/09/2015  . Insomnia 02/09/2015  . Urinary hesitancy 02/09/2015  . Other fatigue 02/09/2015   Past Medical History:  Diagnosis Date  . Asthma   . Diabetes mellitus without complication ([place])   . Diverticulitis   . Fibromyalgia   . GERD (gastroesophageal reflux disease)   . Hypertension   . Migraines   . MS (multiple sclerosis) ([place])   . Ulcer ([place])   . Vision abnormalities    Family History  Problem Relation Age of Onset  . Heart disease Mother   . Kidney disease Mother   . Diabetes Mother   . Other Father   . Diabetes Father   . Multiple sclerosis Daughter   . Multiple sclerosis Other   . Diabetes Sister   . Alcohol abuse Brother     ETOH and marijuana  . Diabetes Brother    Past Surgical History:  Procedure Laterality Date  . ABDOMINAL HYSTERECTOMY  1976   including oophorectomy, due to uterine cancer  . APPENDECTOMY  1974  . CHOLECYSTECTOMY     2006  . COLON SURGERY    . CTR    . KNEE SURGERY     Social History   Occupational History  . Not on file.   Social History Main Topics  . Smoking status: Former Smoker    Packs/day: 0.25    Years: 5.00    Types: Cigarettes    Quit date: 12/19/1995  . Smokeless tobacco: Never Used  . Alcohol use Yes     Comment: socially only  . Drug use: No  . Sexual activity: Not on file

## 2016-01-10 NOTE — Procedures (Signed)
Cervical Epidural Steroid Injection - Interlaminar Approach with Fluoroscopic Guidance  Patient: Courtney [name]      Date of Birth: [birth year] MRN: [id number] PCP: Wilfred Lacy, NP      Visit Date: 01/10/2016   Universal Protocol:    Date/Time: 11/09/173:19 PM  Consent Given By: the patient  Position: PRONE  Additional Comments: Vital signs were monitored before and after the procedure. Patient was prepped and draped in the usual sterile fashion. The correct patient, procedure, and site was verified.   Injection Procedure Details:  Procedure Site One Meds Administered:  Meds ordered this encounter  Medications  . lidocaine (PF) (XYLOCAINE) 1 % injection 0.3 mL  . methylPREDNISolone acetate (DEPO-MEDROL) injection 80 mg     Laterality: Right  Location/Site:  C7-T1  Needle size: 20 G  Needle type: Touhy  Needle Placement: Paramedian epidural space  Findings:  -Contrast Used: 2 mL iohexol 180 mg iodine/mL   -Comments: Excellent flow of contrast into the epidural space.  Procedure Details: Using a paramedian approach from the side mentioned above, the region overlying the inferior lamina was localized under fluoroscopic visualization and the soft tissues overlying this structure were infiltrated with 4 ml. of 1% Lidocaine without Epinephrine. A # 20 gauge, Tuohy needle was inserted into the epidural space using a paramedian approach.  The epidural space was localized using loss of resistance along with lateral and contralateral oblique bi-planar fluoroscopic views.  After negative aspirate for air, blood, and CSF, a 2 ml. volume of Isovue-250 was injected into the epidural space and the flow of contrast was observed. Radiographs were obtained for documentation purposes.   The injectate was administered into the level noted above.  Additional Comments:  The patient tolerated the procedure well Dressing: Band-Aid    Post-procedure details: Patient was  observed during the procedure. Post-procedure instructions were reviewed.  Patient left the clinic in stable condition.

## 2016-01-10 NOTE — Patient Instructions (Signed)

## 2016-01-16 ENCOUNTER — Telehealth: Payer: Self-pay | Admitting: Emergency Medicine

## 2016-01-16 ENCOUNTER — Ambulatory Visit: Payer: Medicare Other | Admitting: Pulmonary Disease

## 2016-01-16 NOTE — Telephone Encounter (Signed)
Pt called and stated the neurologist she used to go to is faxing some papers over to you. She is asking if there is a was she can get a copy of her office visit as well. Please advise thanks.

## 2016-01-17 NOTE — Telephone Encounter (Signed)
Patient called again about this

## 2016-01-18 NOTE — Telephone Encounter (Signed)
Tried to call patient, phone just rings and rings, no answering machine, no way to leave message---if patient calls back---her faxed copies go directly to scan and she will need to contact medical records to get copies of faxed neurology papers----I can reprint her last AVS with [place] if she would like it mailed or she can pick up at front desk

## 2016-01-21 DIAGNOSIS — M542 Cervicalgia: Secondary | ICD-10-CM | POA: Diagnosis not present

## 2016-01-21 DIAGNOSIS — M79641 Pain in right hand: Secondary | ICD-10-CM | POA: Diagnosis not present

## 2016-01-21 DIAGNOSIS — G894 Chronic pain syndrome: Secondary | ICD-10-CM | POA: Diagnosis not present

## 2016-01-21 DIAGNOSIS — M2550 Pain in unspecified joint: Secondary | ICD-10-CM | POA: Diagnosis not present

## 2016-01-21 DIAGNOSIS — M5430 Sciatica, unspecified side: Secondary | ICD-10-CM | POA: Diagnosis not present

## 2016-01-21 DIAGNOSIS — F5104 Psychophysiologic insomnia: Secondary | ICD-10-CM | POA: Diagnosis not present

## 2016-01-21 DIAGNOSIS — G35 Multiple sclerosis: Secondary | ICD-10-CM | POA: Diagnosis not present

## 2016-01-21 DIAGNOSIS — M79642 Pain in left hand: Secondary | ICD-10-CM | POA: Diagnosis not present

## 2016-01-21 DIAGNOSIS — M797 Fibromyalgia: Secondary | ICD-10-CM | POA: Diagnosis not present

## 2016-02-01 DIAGNOSIS — R252 Cramp and spasm: Secondary | ICD-10-CM | POA: Diagnosis not present

## 2016-02-01 DIAGNOSIS — M797 Fibromyalgia: Secondary | ICD-10-CM | POA: Diagnosis not present

## 2016-02-01 DIAGNOSIS — L630 Alopecia (capitis) totalis: Secondary | ICD-10-CM | POA: Diagnosis not present

## 2016-02-01 DIAGNOSIS — E118 Type 2 diabetes mellitus with unspecified complications: Secondary | ICD-10-CM | POA: Diagnosis not present

## 2016-02-01 DIAGNOSIS — G4700 Insomnia, unspecified: Secondary | ICD-10-CM | POA: Diagnosis not present

## 2016-02-01 DIAGNOSIS — I10 Essential (primary) hypertension: Secondary | ICD-10-CM | POA: Diagnosis not present

## 2016-02-01 DIAGNOSIS — G35 Multiple sclerosis: Secondary | ICD-10-CM | POA: Diagnosis not present

## 2016-02-07 ENCOUNTER — Ambulatory Visit: Payer: Medicare Other | Admitting: Nurse Practitioner

## 2016-02-18 DIAGNOSIS — G894 Chronic pain syndrome: Secondary | ICD-10-CM | POA: Diagnosis not present

## 2016-02-18 DIAGNOSIS — M79606 Pain in leg, unspecified: Secondary | ICD-10-CM | POA: Diagnosis not present

## 2016-02-18 DIAGNOSIS — M542 Cervicalgia: Secondary | ICD-10-CM | POA: Diagnosis not present

## 2016-02-21 DIAGNOSIS — M545 Low back pain: Secondary | ICD-10-CM | POA: Diagnosis not present

## 2016-02-28 DIAGNOSIS — M79644 Pain in right finger(s): Secondary | ICD-10-CM | POA: Diagnosis not present

## 2016-02-28 DIAGNOSIS — M79645 Pain in left finger(s): Secondary | ICD-10-CM | POA: Diagnosis not present

## 2016-03-06 DIAGNOSIS — E083393 Diabetes mellitus due to underlying condition with moderate nonproliferative diabetic retinopathy without macular edema, bilateral: Secondary | ICD-10-CM | POA: Diagnosis not present

## 2016-03-06 DIAGNOSIS — M79644 Pain in right finger(s): Secondary | ICD-10-CM | POA: Diagnosis not present

## 2016-03-07 ENCOUNTER — Telehealth: Payer: Self-pay | Admitting: Neurology

## 2016-03-07 NOTE — Telephone Encounter (Signed)
patient last saw you in 2016 she would like to switch to Dr Rexene Alberts Is this ok?

## 2016-03-07 NOTE — Telephone Encounter (Signed)
I am not comfortable ruling out or treating MS or fibromyalgia. I would like to decline the request for switching MDs.

## 2016-03-07 NOTE — Telephone Encounter (Signed)
Ok with me 

## 2016-03-07 NOTE — Telephone Encounter (Signed)
This is a patient of Dr Felecia Shelling. She  was last here in 2016. She would like to switch to you. Is this ok?

## 2016-03-17 DIAGNOSIS — G894 Chronic pain syndrome: Secondary | ICD-10-CM | POA: Diagnosis not present

## 2016-03-17 DIAGNOSIS — M542 Cervicalgia: Secondary | ICD-10-CM | POA: Diagnosis not present

## 2016-03-17 DIAGNOSIS — M79643 Pain in unspecified hand: Secondary | ICD-10-CM | POA: Diagnosis not present

## 2016-03-17 DIAGNOSIS — M79606 Pain in leg, unspecified: Secondary | ICD-10-CM | POA: Diagnosis not present

## 2016-03-18 ENCOUNTER — Ambulatory Visit (INDEPENDENT_AMBULATORY_CARE_PROVIDER_SITE_OTHER): Payer: Medicare Other | Admitting: Orthopaedic Surgery

## 2016-03-18 ENCOUNTER — Encounter (INDEPENDENT_AMBULATORY_CARE_PROVIDER_SITE_OTHER): Payer: Self-pay | Admitting: Orthopaedic Surgery

## 2016-03-18 VITALS — BP 141/93 | HR 87 | Ht 63.0 in | Wt 170.0 lb

## 2016-03-18 DIAGNOSIS — G35 Multiple sclerosis: Secondary | ICD-10-CM

## 2016-03-18 DIAGNOSIS — R200 Anesthesia of skin: Secondary | ICD-10-CM | POA: Diagnosis not present

## 2016-03-18 DIAGNOSIS — M797 Fibromyalgia: Secondary | ICD-10-CM | POA: Diagnosis not present

## 2016-03-18 HISTORY — DX: Anesthesia of skin: R20.0

## 2016-03-18 NOTE — Progress Notes (Signed)
Office Visit Note   Patient: Courtney [name]           Date of Birth: Jul 02, 1955           MRN: [id number] Visit Date: 03/18/2016              Requested by: Flossie Buffy, NP [address], [place] [address] PCP: [name], MD   Assessment & Plan: Visit Diagnoses:  1. Numbness in both hands   2. Multiple sclerosis ([place])   3. Fibromyalgia     Plan: From an orthopedic standpoint we really have nothing to offer her at this time. She can continue with her cervical spine injections by calling Dr. Romona Curls office directly. Certainly if these fail then she would need to have a neurosurgeon look at her for possible surgical intervention. However with her medical conditions such as the MS this would have to be a coordinated occurrence with her neurologist.  I have personally spent 45 minutes with the patient going over her MRI of her cervical spine as well as her EMGs. At least 50-60% of this was dedicated to counseling. She is fully understanding and was thankful for the discussion.  Follow-Up Instructions: Return if symptoms worsen or fail to improve.   Orders:  No orders of the defined types were placed in this encounter.  No orders of the defined types were placed in this encounter.     Procedures: No procedures performed   Clinical Data: No additional findings.   Subjective: Chief Complaint  Patient presents with  . Right Hand - Numbness  . Left Hand - Numbness    Courtney [name] is a pleasant 61 year old female who is seen today for evaluation. She had been seen previously for cervical spine pain and this dates back to when she had lived in [place]. She's also had a diagnosis of fibromyalgia as well as a history of multiple sclerosis. She did have a cervical MRI that been performed previously as well as a MRI of her lumbar spine. Results are noted in the chart. She has had injection by Dr. Ernestina Patches and she states she is about 80% better in the cervical spine.  And actually in the lumbar spine also in regards to her pains.  Pt presents today for BIL hand pain. Multiple joint pain and numbness and has fibromyalgia (Dr. Jolee Ewing) said there was nothing he could do for her.  She is trying to get into a new rheumatologist at [facility].   Pt presents with Cervical and LBP, referral to Dr. Ernestina Patches  For injection. Pt went to see Dr. Ernestina Patches 01/08/16 and states she still has problems with her neck and back but not bad enough to get more injections. She actually states that she's had about 80% improvement in both her cervical spine and her lumbar spine.  Pt Is a diabetic and while trying to lose weight, she used protein shakes and her last A1c was elevated but went back to doctor and put on Victoza. She states she cannot be having so many cortisone injections due to the diabetes and her kidney functions.  She states that her medical doctor had sent her back to [place] for our evaluation.    Review of Systems  Constitutional: Negative.   HENT: Negative.   Respiratory: Negative.   Cardiovascular:       History of hypertension  Gastrointestinal: Negative.   Endocrine:       History of diabetes and thyroid problems  Genitourinary: Negative.   Skin: Negative.   Neurological:       History of multiple sclerosis and fibromyalgia  Hematological: Negative.   Psychiatric/Behavioral: Negative.      Objective: Vital Signs: BP (!) 141/93   Pulse 87   Ht 5\' 3"  (1.6 m)   Wt 170 lb (77.1 kg)   BMI 30.11 kg/m   Physical Exam  Constitutional: She is oriented to person, place, and time. She appears well-developed and well-nourished.  HENT:  Head: Normocephalic and atraumatic.  Eyes: EOM are normal. Pupils are equal, round, and reactive to light.  Pulmonary/Chest: Effort normal.  Neurological: She is alert and oriented to person, place, and time.  Skin: Skin is warm and dry.  Psychiatric: She has a normal mood and affect. Her behavior is normal. Judgment and  thought content normal.    Ortho Exam  Both hands are warm. Color. She does have sensation though she states it is decreased in the [place] areas. She does have some pain with motion of the cervical spine and the lumbar spine but since her blocks these have also improved. Otherwise nervous intact  Specialty Comments:  No specialty comments available.  Imaging: No results found.   PMFS History: Patient Active Problem List   Diagnosis Date Noted  . Numbness in both hands 03/18/2016  . Hypersomnia 11/09/2015  . Diabetes mellitus ([place]) 07/18/2015  . Essential hypertension 07/18/2015  . Abnormal ECG 07/18/2015  . Multiple sclerosis ([place]) 02/09/2015  . Gait difficulty 02/09/2015  . Fibromyalgia 02/09/2015  . Depression with anxiety 02/09/2015  . Numbness 02/09/2015  . Insomnia 02/09/2015  . Urinary hesitancy 02/09/2015  . Other fatigue 02/09/2015   Past Medical History:  Diagnosis Date  . Asthma   . Diabetes mellitus without complication ([place])   . Diverticulitis   . Fibromyalgia   . GERD (gastroesophageal reflux disease)   . Hypertension   . Migraines   . MS (multiple sclerosis) ([place])   . Ulcer ([place])   . Vision abnormalities     Family History  Problem Relation Age of Onset  . Heart disease Mother   . Kidney disease Mother   . Diabetes Mother   . Other Father   . Diabetes Father   . Multiple sclerosis Daughter   . Multiple sclerosis Other   . Diabetes Sister   . Alcohol abuse Brother     ETOH and marijuana  . Diabetes Brother     Past Surgical History:  Procedure Laterality Date  . ABDOMINAL HYSTERECTOMY  1976   including oophorectomy, due to uterine cancer  . APPENDECTOMY  1974  . CHOLECYSTECTOMY     2006  . COLON SURGERY    . CTR    . KNEE SURGERY     Social History   Occupational History  . Not on file.   Social History Main Topics  . Smoking status: Former Smoker    Packs/day: 0.25    Years: 5.00    Types: Cigarettes    Quit date: 12/19/1995    . Smokeless tobacco: Never Used  . Alcohol use Yes     Comment: socially only  . Drug use: No  . Sexual activity: Not on file

## 2016-03-24 ENCOUNTER — Ambulatory Visit (INDEPENDENT_AMBULATORY_CARE_PROVIDER_SITE_OTHER): Payer: Medicare Other | Admitting: Orthopaedic Surgery

## 2016-03-26 ENCOUNTER — Telehealth: Payer: Self-pay | Admitting: Gastroenterology

## 2016-03-26 NOTE — Telephone Encounter (Signed)
Left message for patient on 01/14/16 and 02/27/16 for patient to return our call. Dr. Silverio Decamp is requesting path report. Records will be in "records reviewed" folder.

## 2016-03-31 ENCOUNTER — Ambulatory Visit (INDEPENDENT_AMBULATORY_CARE_PROVIDER_SITE_OTHER): Payer: Medicare Other | Admitting: Neurology

## 2016-03-31 ENCOUNTER — Encounter: Payer: Self-pay | Admitting: Neurology

## 2016-03-31 VITALS — BP 112/78 | HR 90 | Resp 18 | Ht 63.0 in | Wt 169.5 lb

## 2016-03-31 DIAGNOSIS — R5383 Other fatigue: Secondary | ICD-10-CM

## 2016-03-31 DIAGNOSIS — E118 Type 2 diabetes mellitus with unspecified complications: Secondary | ICD-10-CM | POA: Diagnosis not present

## 2016-03-31 DIAGNOSIS — Z794 Long term (current) use of insulin: Secondary | ICD-10-CM | POA: Diagnosis not present

## 2016-03-31 DIAGNOSIS — M797 Fibromyalgia: Secondary | ICD-10-CM | POA: Diagnosis not present

## 2016-03-31 DIAGNOSIS — R269 Unspecified abnormalities of gait and mobility: Secondary | ICD-10-CM

## 2016-03-31 DIAGNOSIS — F418 Other specified anxiety disorders: Secondary | ICD-10-CM

## 2016-03-31 DIAGNOSIS — G250 Essential tremor: Secondary | ICD-10-CM

## 2016-03-31 DIAGNOSIS — G4700 Insomnia, unspecified: Secondary | ICD-10-CM

## 2016-03-31 DIAGNOSIS — R200 Anesthesia of skin: Secondary | ICD-10-CM

## 2016-03-31 DIAGNOSIS — M2550 Pain in unspecified joint: Secondary | ICD-10-CM | POA: Diagnosis not present

## 2016-03-31 HISTORY — DX: Essential tremor: G25.0

## 2016-03-31 HISTORY — DX: Pain in unspecified joint: M25.50

## 2016-03-31 MED ORDER — LAMOTRIGINE 100 MG PO TABS: 100.0000 mg | ORAL_TABLET | Freq: Two times a day (BID) | ORAL | 3 refills | Status: DC

## 2016-03-31 MED ORDER — LAMOTRIGINE 100 MG PO TABS: 100.0000 mg | ORAL_TABLET | Freq: Every day | ORAL | 11 refills | Status: DC

## 2016-03-31 MED ORDER — AMITRIPTYLINE HCL 25 MG PO TABS: 25.0000 mg | ORAL_TABLET | Freq: Every day | ORAL | 3 refills | Status: DC

## 2016-03-31 NOTE — Patient Instructions (Signed)
The pharmacy has the prescription for lamotrigine 100 mg tablets. For 5 days, just take one half pill a day. For the next 5 days, take one half pill twice a day. For the next 5 days, take one half pill 3 times a day Then start taking one pill twice a day from this point on.    In the future, we may increase the dose further.  If you get a rash, need to stop the medication and not take it again. 

## 2016-03-31 NOTE — Progress Notes (Signed)
[facility]  PATIENT: Courtney [name] DOB: 1956/02/02  REFERRING DOCTOR OR PCP:  Lucianne Lei SOURCE: Patient, MRI images on CD, MRI reports.  _________________________________   HISTORICAL  CHIEF COMPLAINT:  Chief Complaint  Patient presents with  . Multiple Sclerosis    Sts. she stopped Avonex 3-4 mos. ago.  She was seen by Dr. Felecia Shelling once in Dec. 2016 and intended to go back to [facility] to see her regular neurologist, but never did.  She is here today to discuss other tx. options--sts. would like to discuss oral meds.  Sts. she has been having mult. joint pain, has had multiple ER visits and sts. has been told pain is due to combo of MS and FMS.  Sts. she is in the process of trying to get an appt. with a FMS specialist at [facility].  She also sees a pain mx. physician   . Multiple Joint Pain    (Dr. Mirna Mires here in [place]).  Sts. has seen a rhematology but he was not able to help her/fim  . Gait Disturbance    HISTORY OF PRESENT ILLNESS:  Courtney [name] is a 61 yo woman with fibromyalgia and a mildly abnormal MRI who was diagnosed with MS in 2013 but reports symptoms since 2006.   Questionable MS history:  She presented with hot sensations in different parts of the body and also noted some trouble with her gait and balance.    She was initially diagnosed with Parkinson's disease and started on Mirapex.    Around 2013, she reports numbness in her entire right side. More MRIs were performed.    I reviewed several of the studiesa at her last visit The MRI of the brain 11/24/11 shows a couple tiny T2/FLAIR hyperintense foci in the hemispheres which would be normal for age. The MRIs of the spine 11/24/11 did not show any abnormality within the spinal cord.   Reports of MRIs in 2014 2015 ordered a couple of small white matter foci that were unchanged.   In 2013, she was seen by Dr. Mart Piggs at [place] in [place] and diagnosed with multiple  sclerosis.  She also had a lumbar puncture June 2015. We don't have the results..  She started Copaxone.     She then was placed on Plegridy after she had difficulties with the Copaxone due to site reactions. She had injection site reactions with Plegridy as well and was on Avonex until I last saw her in 2016.   I was not certain of the diagnosis based on my interpretation of history and MRI and suggested a follow up MRI which she did not do.   She ran out of Avonex in late 2016 and has not been on any DMT since then.  She continue to report right > left symptoms, fatigue, pain.   MRI of the cervical spine 12/18/15 was personally reviewed and shows mild spinal stenosis and right foraminal narrowing at C4C5.   The spinal cord is normal  Fibromyalgia:   She reports pain all over her body for the past few years   Pain is in the head, arms, trunk, legs. She notes that her joints sometimes will stiffen up/ lock up and ache. This seems to be worse often during the night.    She was diagnosed with fibromyalgia in the past and was placed on Neurontin, Lyrica and Cymbalta at various times.   She felt no benefit.   She is been  on multiple muscle relaxants in the past and thinks she was on baclofen, Zanaflex and Flexeril.   She is currently on Oxycontin and oxycodone (sees Dr. Mirna Mires for Pain Management).      She has seen rheumatology in the past and was told she did not have RA  Spine:  She has neck pain and lower back pain.   MRI of the cervical spine showed mild C4C5 spinal stenosis.   The spinal cord was normal.  There is foraminal narrowing to the right at C4C5 but no nerve root compression.    Gait/strength/sensation: She reports difficulty with her gait.  She uses a scooter for long walks.   She states her legs give out if she walks further.  She feels she is dragging her right foot drags She has numbness in the right leg and more weakness in the left leg.  Bladder/bowel: She reports some urinary hesitancy and  frequency. She has had urinary tract infections. She reports a lot of diarrhea and has had bowel incontinence.  Vision:   She reports that she has difficulty with eye pain and diplopia at times. Also at times, for a few minutes, she will note decreased vision in one or both eyes.  Fatigue/sleep: She is always feeling tired.    She also notes that she has insomnia, with difficulty falling asleep and staying asleep.    For insomnia, she has been on Ambien, Lunesta, Belsomra and trazodone.  She only felt they helped for a few days and then stopped working.   She snores but does not have pauses.    A PSG recently showed minimal OSA by her repot.    Mood/cognition: She reports depression and anxiety. She gets irritable easily.   She cries a lot.   She states she has been treated with multiple antidepressants but they have not been of much benefit. She also takes Xanax 2 mg 3 times a day for the anxiety.    She also reports difficulty with her cognition, especially short-term memory.    REVIEW OF SYSTEMS: Constitutional: No fevers, chills, sweats, or change in appetite.   She has fatigue and poor sleep. Eyes: No visual changes, double vision, eye pain Ear, nose and throat: No hearing loss, ear pain, nasal congestion, sore throat Cardiovascular: No chest pain, palpitations Respiratory: No shortness of breath at rest or with exertion.   No wheezes.  She snores. PSG in the past was reportedly fairly normal GastrointestinaI: No nausea, vomiting, abdominal pain, fecal incontinence.  She reports diarrhea. Genitourinary:   She reports urinary frequency and hesitancy. No nocturia. Musculoskeletal: see above Integumentary: No rash, pruritus, skin lesions Neurological: as above Psychiatric: see above. Endocrine: No palpitations, diaphoresis, change in appetite, change in weigh or increased thirst Hematologic/Lymphatic: No anemia, purpura, petechiae. Allergic/Immunologic: No itchy/runny eyes, nasal  congestion, recent allergic reactions, rashes  ALLERGIES: Allergies  Allergen Reactions  . Ibuprofen   . Prednisone Anaphylaxis, Shortness Of Breath and Palpitations  . Solu-Medrol [Methylprednisolone] Anaphylaxis, Shortness Of Breath and Palpitations  . Bactrim [Sulfamethoxazole-Trimethoprim] Other (See Comments)    "blotchiness" redness, face swelling  . Biaxin [Clarithromycin] Swelling  . Naproxen     HOME MEDICATIONS:  Current Outpatient Prescriptions:  .  albuterol (PROVENTIL HFA;VENTOLIN HFA) 108 (90 Base) MCG/ACT inhaler, Inhale 2 puffs into the lungs every 6 (six) hours as needed for wheezing or shortness of breath., Disp: 3 Inhaler, Rfl: 3 .  alprazolam (XANAX) 2 MG tablet, Take 2 mg by mouth 3 (three) times  daily., Disp: , Rfl:  .  Biotin 1000 MCG tablet, Take 1,000 mcg by mouth daily., Disp: , Rfl:  .  BYDUREON 2 MG PEN, Inject 2 mg as directed once a week., Disp: , Rfl: 5 .  insulin aspart protamine- aspart (NOVOLOG MIX 70/30) (70-30) 100 UNIT/ML injection, Inject 0.26-0.3 mLs (26-30 Units total) into the skin 2 (two) times daily with a meal. 20 units in am, 26 units in pm, Disp: 10 mL, Rfl: 0 .  interferon beta-1a (AVONEX) 30 MCG/0.5ML PSKT injection, Inject 30 mcg into the muscle every 7 (seven) days., Disp: , Rfl:  .  lidocaine (XYLOCAINE) 5 % ointment, Apply 1 application topically daily as needed for mild pain., Disp: , Rfl:  .  oxyCODONE (ROXICODONE) 15 MG immediate release tablet, Take 1 tablet by mouth 5 times daily as needed for pain, Disp: , Rfl: 0 .  OXYCONTIN 30 MG 12 hr tablet, Take 1 tablet(s) by mouth EVERY TWELVE HOURS], Disp: , Rfl: 0 .  Telmisartan-Amlodipine 40-5 MG TABS, Take 0.5 tablets by mouth daily. , Disp: , Rfl:  .  amitriptyline (ELAVIL) 25 MG tablet, Take 1 tablet (25 mg total) by mouth at bedtime., Disp: 90 tablet, Rfl: 3 .  cyclobenzaprine (FLEXERIL) 5 MG tablet, Take 1 tablet by mouth twice daily as needed for spasms, Disp: , Rfl: 2 .   lamoTRIgine (LAMICTAL) 100 MG tablet, Take 1 tablet (100 mg total) by mouth 2 (two) times daily., Disp: 180 tablet, Rfl: 3  PAST MEDICAL HISTORY: Past Medical History:  Diagnosis Date  . Asthma   . Diabetes mellitus without complication ([place])   . Diverticulitis   . Fibromyalgia   . GERD (gastroesophageal reflux disease)   . Hypertension   . Migraines   . MS (multiple sclerosis) ([place])   . Ulcer ([place])   . Vision abnormalities     PAST SURGICAL HISTORY: Past Surgical History:  Procedure Laterality Date  . ABDOMINAL HYSTERECTOMY  1976   including oophorectomy, due to uterine cancer  . APPENDECTOMY  1974  . CHOLECYSTECTOMY     2006  . COLON SURGERY    . CTR    . KNEE SURGERY      FAMILY HISTORY: Family History  Problem Relation Age of Onset  . Heart disease Mother   . Kidney disease Mother   . Diabetes Mother   . Other Father   . Diabetes Father   . Multiple sclerosis Daughter   . Multiple sclerosis Other   . Diabetes Sister   . Alcohol abuse Brother     ETOH and marijuana  . Diabetes Brother     SOCIAL HISTORY:  Social History   Social History  . Marital status: Married    Spouse name: N/A  . Number of children: N/A  . Years of education: N/A   Occupational History  . Not on file.   Social History Main Topics  . Smoking status: Former Smoker    Packs/day: 0.25    Years: 5.00    Types: Cigarettes    Quit date: 12/19/1995  . Smokeless tobacco: Never Used  . Alcohol use Yes     Comment: socially only  . Drug use: No  . Sexual activity: Not on file   Other Topics Concern  . Not on file   Social History Narrative  . No narrative on file     PHYSICAL EXAM  Vitals:   03/31/16 0856  BP: 112/78  Pulse: 90  Resp: 18  Weight:  169 lb 8 oz (76.9 kg)  Height: 5\' 3"  (1.6 m)    Body mass index is 30.03 kg/m.   General: The patient is well-developed and well-nourished and in no acute distress  Eyes:  Funduscopic exam shows normal optic discs  and retinal vessels.  Neck: The neck is supple, no carotid bruits are noted.  Range of motion is reasonably normal. She is tender in the paraspinal muscles, trapezius and rhomboids  Cardiovascular: The heart has a regular rate and rhythm with a normal S1 and S2. There were no murmurs, gallops or rubs. Lungs are clear to auscultation.  Skin: Extremities are without significant edema.  Musculoskeletal:  She is tender most of the classic fibromyalgia tender points   Neurologic Exam  Mental status: The patient is alert and oriented x 3 at the time of the examination. The patient has apparent normal recent and remote memory, with an apparently normal attention span and concentration ability.   Speech is normal.  Cranial nerves: Extraocular movements are full. Pupils are equal, round, and reactive to light and accomodation.    Facial symmetry is present. There is good facial sensation to soft touch bilaterally.Facial strength is normal.  Trapezius and sternocleidomastoid strength is normal. No dysarthria is noted.  The tongue is midline, and the patient has symmetric elevation of the soft palate. No obvious hearing deficits are noted.  Motor:  She has a fast tremor in her hands with some distractibility.   Muscle bulk is normal.   Tone is normal. Strength is  5 / 5 in all 4 extremities.   Sensory: She reports decreased sensation to touch and sensation on the left side.  Coordination: Cerebellar testing reveals good finger-nose-finger bilaterally.  Gait and station: Station is normal.   Gait is arthritic and she uses a cane. Tandem gait is mildly wide. Romberg is negative.   Reflexes: Deep tendon reflexes are symmetric and normal bilaterally.        DIAGNOSTIC DATA (LABS, IMAGING, TESTING) - I reviewed patient records, labs, notes, testing and imaging myself where available.  Lab Results  Component Value Date   WBC 4.2 02/03/2015   HGB 11.9 (L) 02/03/2015   HCT 35.8 (L) 02/03/2015   MCV  86.1 02/03/2015   PLT 147 (L) 02/03/2015      Component Value Date/Time   NA 139 01/08/2016 1108   K 3.8 01/08/2016 1108   CL 101 01/08/2016 1108   CO2 30 01/08/2016 1108   GLUCOSE 143 (H) 01/08/2016 1108   BUN 14 01/08/2016 1108   CREATININE 0.96 01/08/2016 1108   CALCIUM 9.7 01/08/2016 1108   PROT 7.9 01/08/2016 1108   ALBUMIN 4.7 01/08/2016 1108   AST 29 01/08/2016 1108   ALT 30 01/08/2016 1108   ALKPHOS 77 01/08/2016 1108   BILITOT 0.5 01/08/2016 1108   GFRNONAA >60 02/03/2015 1145   GFRAA >60 02/03/2015 1145       ASSESSMENT AND PLAN  Fibromyalgia - Plan: MR BRAIN W WO CONTRAST, ANA w/Reflex, Rheumatoid factor, Sedimentation rate, C-reactive protein  Numbness  Depression with anxiety  Gait difficulty  Other fatigue  Insomnia, unspecified type  Type 2 diabetes mellitus with complication, with long-term current use of insulin (HCC)  Multiple joint pain - Plan: MR BRAIN W WO CONTRAST, ANA w/Reflex, Rheumatoid factor, Sedimentation rate, C-reactive protein  Essential tremor   I personally reviewed MRIs of the spine this visit and the brain last visit.  There is not enough evidence MS and the a  few spots in the brain is likely age appropriate minimal chronic microvascular ischemic change.   At her last visit in 2016, we stopped Avonex.    We will check another MRI of the brain and compare to the 2015 MRI.   If the images are stable off medication then MS becomes even less likely.   Many symptoms are due to her Fibromyalgia and degenerative spine changes.   She also has mils benign essential tremor, depression and poor sleep.      1.   Check an MRI of the brain and compare to 2015 MRI.   Check rheum labs.   2.   If there has been no significant changes and MRI continue to show age appropriate CNS changes, then I MS is even more unlikely.  If significant changes consistent with MS have occurred,  then we will start one of the oral agents.    3.   Lamotrigine titrate  to 100 mg po bid and amitriptyline for fibromyalgia and sleep.    4.   RTC 3 months  45 minutes face-to-face evaluation with greater than one half of the time counseling or coordinating care about her chronic pain, fatigue, other symptoms and MRI findings and other possible diagnoses.  Joanell Cressler A. Felecia Shelling, MD, PhD 0/27/7412, 8:78 AM Certified in Neurology, Clinical Neurophysiology, Sleep Medicine, Pain Medicine and Neuroimaging  [facility] [address] [place], [place] [address] [phone]

## 2016-03-31 NOTE — Telephone Encounter (Signed)
LM on Vmail.  Per Dr. Silverio Decamp patient is not due for a recall colon until 10/2017.  Records placed in the "records reviewed" folder.

## 2016-04-01 ENCOUNTER — Encounter: Payer: Self-pay | Admitting: Gastroenterology

## 2016-04-01 LAB — SEDIMENTATION RATE: SED RATE: 7 mm/h (ref 0–40)

## 2016-04-01 LAB — RHEUMATOID FACTOR

## 2016-04-01 LAB — C-REACTIVE PROTEIN: CRP: 2.4 mg/L (ref 0.0–4.9)

## 2016-04-01 LAB — ANA W/REFLEX: ANA: NEGATIVE

## 2016-04-02 ENCOUNTER — Telehealth: Payer: Self-pay | Admitting: *Deleted

## 2016-04-02 ENCOUNTER — Encounter: Payer: Self-pay | Admitting: *Deleted

## 2016-04-02 NOTE — Telephone Encounter (Signed)
Pt. notified of lab results via mychart/fim

## 2016-04-04 ENCOUNTER — Encounter: Payer: Self-pay | Admitting: Vascular Surgery

## 2016-04-10 DIAGNOSIS — J1189 Influenza due to unidentified influenza virus with other manifestations: Secondary | ICD-10-CM | POA: Diagnosis not present

## 2016-04-11 ENCOUNTER — Encounter: Payer: Medicare Other | Admitting: Vascular Surgery

## 2016-04-11 ENCOUNTER — Ambulatory Visit (HOSPITAL_COMMUNITY): Payer: Medicare Other

## 2016-04-14 DIAGNOSIS — G35 Multiple sclerosis: Secondary | ICD-10-CM | POA: Diagnosis not present

## 2016-04-14 DIAGNOSIS — M79643 Pain in unspecified hand: Secondary | ICD-10-CM | POA: Diagnosis not present

## 2016-04-14 DIAGNOSIS — M542 Cervicalgia: Secondary | ICD-10-CM | POA: Diagnosis not present

## 2016-04-14 DIAGNOSIS — M069 Rheumatoid arthritis, unspecified: Secondary | ICD-10-CM | POA: Diagnosis not present

## 2016-04-14 DIAGNOSIS — G894 Chronic pain syndrome: Secondary | ICD-10-CM | POA: Diagnosis not present

## 2016-04-14 DIAGNOSIS — M79606 Pain in leg, unspecified: Secondary | ICD-10-CM | POA: Diagnosis not present

## 2016-04-25 ENCOUNTER — Ambulatory Visit
Admission: RE | Admit: 2016-04-25 | Discharge: 2016-04-25 | Disposition: A | Discharge status: Home / Self Care | Payer: Medicare Other | Source: Ambulatory Visit | Attending: Neurology | Admitting: Neurology

## 2016-04-25 DIAGNOSIS — M797 Fibromyalgia: Secondary | ICD-10-CM | POA: Diagnosis not present

## 2016-04-25 DIAGNOSIS — G35 Multiple sclerosis: Secondary | ICD-10-CM | POA: Diagnosis not present

## 2016-04-25 DIAGNOSIS — M2550 Pain in unspecified joint: Secondary | ICD-10-CM

## 2016-04-25 MED ORDER — GADOBENATE DIMEGLUMINE 529 MG/ML IV SOLN
16.0000 mL | Freq: Once | INTRAVENOUS | Status: AC | PRN
  Administered 2016-04-25: 16 mL via INTRAVENOUS

## 2016-04-28 ENCOUNTER — Telehealth: Payer: Self-pay | Admitting: *Deleted

## 2016-04-28 NOTE — Telephone Encounter (Signed)
I have spoken with Jola this afternoon and per RAS, reviewed MRI results as below/fim

## 2016-04-28 NOTE — Telephone Encounter (Signed)
-----   Message from Britt Bottom, MD sent at 04/26/2016  4:56 PM EST ----- Please let her know that the MRI of the brain shows typical age related changes.   It does not look like multiple sclerosis.

## 2016-04-29 DIAGNOSIS — E119 Type 2 diabetes mellitus without complications: Secondary | ICD-10-CM | POA: Diagnosis not present

## 2016-05-05 ENCOUNTER — Other Ambulatory Visit: Payer: Self-pay | Admitting: Family Medicine

## 2016-05-05 DIAGNOSIS — M79602 Pain in left arm: Secondary | ICD-10-CM | POA: Diagnosis not present

## 2016-05-05 DIAGNOSIS — M79601 Pain in right arm: Secondary | ICD-10-CM

## 2016-05-08 ENCOUNTER — Ambulatory Visit
Admission: RE | Admit: 2016-05-08 | Discharge: 2016-05-08 | Disposition: A | Discharge status: Home / Self Care | Payer: Medicare Other | Source: Ambulatory Visit | Attending: Family Medicine | Admitting: Family Medicine

## 2016-05-08 ENCOUNTER — Ambulatory Visit: Payer: Medicare Other | Admitting: Gastroenterology

## 2016-05-08 DIAGNOSIS — M7989 Other specified soft tissue disorders: Secondary | ICD-10-CM | POA: Diagnosis not present

## 2016-05-08 DIAGNOSIS — M79601 Pain in right arm: Secondary | ICD-10-CM

## 2016-05-12 DIAGNOSIS — M79606 Pain in leg, unspecified: Secondary | ICD-10-CM | POA: Diagnosis not present

## 2016-05-12 DIAGNOSIS — M25519 Pain in unspecified shoulder: Secondary | ICD-10-CM | POA: Diagnosis not present

## 2016-05-12 DIAGNOSIS — G894 Chronic pain syndrome: Secondary | ICD-10-CM | POA: Diagnosis not present

## 2016-05-12 DIAGNOSIS — M79643 Pain in unspecified hand: Secondary | ICD-10-CM | POA: Diagnosis not present

## 2016-05-12 DIAGNOSIS — M542 Cervicalgia: Secondary | ICD-10-CM | POA: Diagnosis not present

## 2016-05-14 ENCOUNTER — Ambulatory Visit (INDEPENDENT_AMBULATORY_CARE_PROVIDER_SITE_OTHER): Payer: Self-pay

## 2016-05-14 ENCOUNTER — Ambulatory Visit (INDEPENDENT_AMBULATORY_CARE_PROVIDER_SITE_OTHER): Payer: Medicare Other | Admitting: Orthopedic Surgery

## 2016-05-14 ENCOUNTER — Encounter (INDEPENDENT_AMBULATORY_CARE_PROVIDER_SITE_OTHER): Payer: Self-pay | Admitting: Orthopedic Surgery

## 2016-05-14 ENCOUNTER — Ambulatory Visit (INDEPENDENT_AMBULATORY_CARE_PROVIDER_SITE_OTHER): Payer: Medicare Other

## 2016-05-14 VITALS — BP 121/83 | HR 92 | Resp 14 | Ht 63.0 in | Wt 169.0 lb

## 2016-05-14 DIAGNOSIS — M25511 Pain in right shoulder: Secondary | ICD-10-CM | POA: Diagnosis not present

## 2016-05-14 DIAGNOSIS — M25512 Pain in left shoulder: Secondary | ICD-10-CM

## 2016-05-14 DIAGNOSIS — G8929 Other chronic pain: Secondary | ICD-10-CM | POA: Diagnosis not present

## 2016-05-14 MED ORDER — LIDOCAINE HCL 1 % IJ SOLN
2.0000 mL | INTRAMUSCULAR | Status: AC | PRN
  Administered 2016-05-14: 2 mL

## 2016-05-14 MED ORDER — BUPIVACAINE HCL 0.5 % IJ SOLN
2.0000 mL | INTRAMUSCULAR | Status: AC | PRN
  Administered 2016-05-14: 2 mL via INTRA_ARTICULAR

## 2016-05-14 MED ORDER — METHYLPREDNISOLONE ACETATE 40 MG/ML IJ SUSP
80.0000 mg | INTRAMUSCULAR | Status: AC | PRN
  Administered 2016-05-14: 80 mg

## 2016-05-14 NOTE — Progress Notes (Signed)
Office Visit Note   Patient: Courtney [name]           Date of Birth: [birth year]           MRN: [id number] Visit Date: 05/14/2016              Requested by: Lucianne Lei, MD [address] [place], [place] [address] PCP: [name], MD   Assessment & Plan: Visit Diagnoses:  1. Chronic left shoulder pain   2. Chronic right shoulder pain     Plan:  #1: Corticosteroid injection to the left shoulder using only 20 mg of Depo-Medrol. She still states that she has had Dr. Ernestina Patches inject her neck with Depo-Medrol without any reaction. Her reaction was IV Depo-Medrol.  Follow-Up Instructions: Return in about 2 weeks (around 05/28/2016).   Orders:  Orders Placed This Encounter  Procedures  . Large Joint Injection/Arthrocentesis  . XR Shoulder Right  . XR Shoulder Left   No orders of the defined types were placed in this encounter.     Procedures: Large Joint Inj Date/Time: 05/14/2016 1:03 PM Performed by: Biagio Borg D Authorized by: Biagio Borg D   Consent Given by:  Patient Timeout: prior to procedure the correct patient, procedure, and site was verified   Indications:  Pain Location:  Shoulder Site:  L subacromial bursa Prep: patient was prepped and draped in usual sterile fashion   Needle Size:  25 G Needle Length:  1.5 inches Approach:  Lateral Ultrasound Guidance: No   Fluoroscopic Guidance: No   Arthrogram: No   Medications:  2 mL bupivacaine 0.5 %; 2 mL lidocaine 1 %; 80 mg methylPREDNISolone acetate 40 MG/ML Aspiration Attempted: No   Patient tolerance:  Patient tolerated the procedure well with no immediate complications      Clinical Data: No additional findings.   Subjective: Chief Complaint  Patient presents with  . Left Shoulder - Pain  . Right Shoulder - Pain    Left shoulder pain x 2 years, MS, diabetic, limited range of motion, left worse than right, no shoulder surgery, no injury, oxycodone - helps, oxycotin helps Right  shoulder pain x 2 years, no shoulder surgery, no injury    Review of Systems  Constitutional: Negative.   HENT: Negative.   Respiratory: Negative.   Cardiovascular:       History of hypertension  Gastrointestinal: Negative.   Endocrine:       History of diabetes and thyroid problems  Genitourinary: Negative.   Skin: Negative.   Neurological:       History of multiple sclerosis and fibromyalgia  Hematological: Negative.   Psychiatric/Behavioral: Negative.      Objective: Vital Signs: BP 121/83 (BP Location: Left Arm, Patient Position: Sitting, Cuff Size: Normal)   Pulse 92   Resp 14   Ht 5\' 3"  (1.6 m)   Wt 169 lb (76.7 kg)   BMI 29.94 kg/m   Physical Exam  Constitutional: She is oriented to person, place, and time. She appears well-developed and well-nourished.  HENT:  Head: Normocephalic and atraumatic.  Eyes: EOM are normal. Pupils are equal, round, and reactive to light.  Pulmonary/Chest: Effort normal.  Neurological: She is alert and oriented to person, place, and time.  Skin: Skin is warm and dry.  Psychiatric: She has a normal mood and affect. Her behavior is normal. Judgment and thought content normal.    Right Shoulder Exam   Tenderness  The patient is experiencing tenderness in the  acromion, acromioclavicular joint and biceps tendon.  Range of Motion  Active Abduction: 60  Passive Abduction: 90  Forward Flexion: 90  External Rotation: 50  Internal Rotation 90 degrees: 40   Muscle Strength  Abduction: 3/5  Internal Rotation: 4/5  External Rotation: 3/5   Tests  Apprehension: positive Impingement: positive  Other  Erythema: absent Sensation: normal Pulse: present   Left Shoulder Exam   Tenderness  The patient is experiencing tenderness in the acromion, acromioclavicular joint and biceps tendon.  Range of Motion  Active Abduction: 60  Passive Abduction: 90  Forward Flexion: 100  External Rotation: 50  Internal Rotation 90 degrees: 50    Muscle Strength  Abduction: 3/5  Internal Rotation: 4/5  External Rotation: 3/5   Tests  Apprehension: positive Impingement: positive  Other  Erythema: absent Sensation: normal Pulse: present       Specialty Comments:  No specialty comments available.  Imaging: Xr Shoulder Left  Result Date: 05/14/2016 2 view left shoulder reveals a type II acromion. She does have some inferior spurring more of the acromion noted. Maybe some mild joint space narrowing at the inferior glenohumeral joint  Xr Shoulder Right  Result Date: 05/14/2016 2 view right shoulder reveals a type 2-2-1/2 a chromium. She does have some before meals sclerosing. Inferior acromial spurring. Mild joint space narrowing inferior glenohumeral joint    PMFS History: Patient Active Problem List   Diagnosis Date Noted  . Multiple joint pain 03/31/2016  . Essential tremor 03/31/2016  . Numbness in both hands 03/18/2016  . Hypersomnia 11/09/2015  . Diabetes mellitus ([place]) 07/18/2015  . Essential hypertension 07/18/2015  . Abnormal ECG 07/18/2015  . Multiple sclerosis ([place]) 02/09/2015  . Gait difficulty 02/09/2015  . Fibromyalgia 02/09/2015  . Depression with anxiety 02/09/2015  . Numbness 02/09/2015  . Insomnia 02/09/2015  . Urinary hesitancy 02/09/2015  . Other fatigue 02/09/2015   Past Medical History:  Diagnosis Date  . Asthma   . Diabetes mellitus without complication ([place])   . Diverticulitis   . Fibromyalgia   . GERD (gastroesophageal reflux disease)   . Hypertension   . Migraines   . MS (multiple sclerosis) ([place])   . Ulcer ([place])   . Vision abnormalities     Family History  Problem Relation Age of Onset  . Heart disease Mother   . Kidney disease Mother   . Diabetes Mother   . Other Father   . Diabetes Father   . Multiple sclerosis Daughter   . Multiple sclerosis Other   . Diabetes Sister   . Alcohol abuse Brother     ETOH and marijuana  . Diabetes Brother     Past Surgical  History:  Procedure Laterality Date  . ABDOMINAL HYSTERECTOMY  1976   including oophorectomy, due to uterine cancer  . APPENDECTOMY  1974  . CHOLECYSTECTOMY     2006  . COLON SURGERY    . CTR    . KNEE SURGERY     Social History   Occupational History  . Not on file.   Social History Main Topics  . Smoking status: Former Smoker    Packs/day: 0.25    Years: 5.00    Types: Cigarettes    Quit date: 68  . Smokeless tobacco: Never Used  . Alcohol use Yes     Comment: socially only  . Drug use: No  . Sexual activity: Not on file

## 2016-05-21 ENCOUNTER — Encounter (HOSPITAL_COMMUNITY): Payer: Self-pay | Admitting: Emergency Medicine

## 2016-05-21 ENCOUNTER — Emergency Department (HOSPITAL_COMMUNITY)
Admission: EM | Admit: 2016-05-21 | Discharge: 2016-05-21 | Disposition: A | Discharge status: Home / Self Care | Payer: Medicare Other | Attending: Emergency Medicine | Admitting: Emergency Medicine

## 2016-05-21 DIAGNOSIS — M79651 Pain in right thigh: Secondary | ICD-10-CM | POA: Diagnosis present

## 2016-05-21 DIAGNOSIS — Z87891 Personal history of nicotine dependence: Secondary | ICD-10-CM | POA: Diagnosis not present

## 2016-05-21 DIAGNOSIS — R202 Paresthesia of skin: Secondary | ICD-10-CM | POA: Insufficient documentation

## 2016-05-21 DIAGNOSIS — J45909 Unspecified asthma, uncomplicated: Secondary | ICD-10-CM | POA: Insufficient documentation

## 2016-05-21 DIAGNOSIS — Z794 Long term (current) use of insulin: Secondary | ICD-10-CM | POA: Diagnosis not present

## 2016-05-21 DIAGNOSIS — E119 Type 2 diabetes mellitus without complications: Secondary | ICD-10-CM | POA: Diagnosis not present

## 2016-05-21 DIAGNOSIS — I10 Essential (primary) hypertension: Secondary | ICD-10-CM | POA: Diagnosis not present

## 2016-05-21 MED ORDER — PREDNISONE 10 MG PO TABS: 10.0000 mg | ORAL_TABLET | Freq: Every day | ORAL | 0 refills | Status: DC

## 2016-05-21 NOTE — ED Provider Notes (Signed)
[facility] DEPT Provider Note   CSN: [id number] Arrival date & time: 05/21/16  0411     History   Chief Complaint Chief Complaint  Patient presents with  . Leg Pain    HPI Courtney [name] is a 61 y.o. female.  HPI Patient presents with right thigh pain. Has had some feelings over the last few months. Much worse last night. It goes down her right anterior lateral thigh. Also some radiation down the back of her leg. Has also some tingling in her right hand and sometimes in her left hand. History of MS although it appears this diagnosis have been questioned somewhat through the neurology notes. No headache. Has had right-sided symptoms numerous times in the past. Does not know if she had chickenpox when she was a child but thinks she had no childhood diseases. No fevers. No trauma. She has back pain but has chronic back pain. Also has a neurologist and pain medicine doctor. States the pain in her leg felt like electricity. She states that a right-handed swollen up earlier today but is now back to normal.   Past Medical History:  Diagnosis Date  . Asthma   . Diabetes mellitus without complication ([place])   . Diverticulitis   . Fibromyalgia   . GERD (gastroesophageal reflux disease)   . Hypertension   . Migraines   . MS (multiple sclerosis) ([place])   . Ulcer ([place])   . Vision abnormalities     Patient Active Problem List   Diagnosis Date Noted  . Multiple joint pain 03/31/2016  . Essential tremor 03/31/2016  . Numbness in both hands 03/18/2016  . Hypersomnia 11/09/2015  . Diabetes mellitus ([place]) 07/18/2015  . Essential hypertension 07/18/2015  . Abnormal ECG 07/18/2015  . Multiple sclerosis ([place]) 02/09/2015  . Gait difficulty 02/09/2015  . Fibromyalgia 02/09/2015  . Depression with anxiety 02/09/2015  . Numbness 02/09/2015  . Insomnia 02/09/2015  . Urinary hesitancy 02/09/2015  . Other fatigue 02/09/2015    Past Surgical History:  Procedure Laterality Date  .  ABDOMINAL HYSTERECTOMY  1976   including oophorectomy, due to uterine cancer  . APPENDECTOMY  1974  . CHOLECYSTECTOMY     2006  . COLON SURGERY    . CTR    . KNEE SURGERY      OB History    No data available       Home Medications    Prior to Admission medications   Medication Sig Start Date End Date Taking? Authorizing Provider  albuterol (PROVENTIL HFA;VENTOLIN HFA) 108 (90 Base) MCG/ACT inhaler Inhale 2 puffs into the lungs every 6 (six) hours as needed for wheezing or shortness of breath. 12/19/15  Yes Marijean Heath, NP  alprazolam Duanne Moron) 2 MG tablet Take 2 mg by mouth 3 (three) times daily as needed for anxiety.    Yes Historical Provider, MD  amitriptyline (ELAVIL) 25 MG tablet Take 1 tablet (25 mg total) by mouth at bedtime. 03/31/16  Yes Britt Bottom, MD  Biotin 1000 MCG tablet Take 1,000 mcg by mouth daily.   Yes Historical Provider, MD  cyclobenzaprine (FLEXERIL) 5 MG tablet Take 1 tablet by mouth twice daily as needed for spasms 12/24/15  Yes Historical Provider, MD  insulin aspart protamine- aspart (NOVOLOG MIX 70/30) (70-30) 100 UNIT/ML injection Inject 0.26-0.3 mLs (26-30 Units total) into the skin 2 (two) times daily with a meal. 20 units in am, 26 units in pm 01/08/16  Yes Flossie Buffy, NP  lamoTRIgine (  LAMICTAL) 100 MG tablet Take 1 tablet (100 mg total) by mouth 2 (two) times daily. 03/31/16  Yes Britt Bottom, MD  lidocaine (XYLOCAINE) 5 % ointment Apply 1 application topically daily as needed for mild pain.   Yes Historical Provider, MD  liraglutide (VICTOZA) 18 MG/3ML SOPN Inject 1.8 mg into the skin daily.   Yes Historical Provider, MD  naloxone [facility]) nasal spray 4 mg/0.1 mL Place 1 spray into the nose daily as needed (overdose).   Yes Historical Provider, MD  oxyCODONE (ROXICODONE) 15 MG immediate release tablet Take 1 tablet by mouth 5 times daily as needed for pain 03/17/16  Yes Historical Provider, MD  OXYCONTIN 30 MG 12 hr tablet Take 1  tablet(s) by mouth EVERY TWELVE HOURS] 03/17/16  Yes Historical Provider, MD  Telmisartan-Amlodipine 40-5 MG TABS Take 0.5 tablets by mouth every other day.    Yes Historical Provider, MD  interferon beta-1a (AVONEX) 30 MCG/0.5ML PSKT injection Inject 30 mcg into the muscle every 7 (seven) days.    Historical Provider, MD  predniSONE (DELTASONE) 10 MG tablet Take 1 tablet (10 mg total) by mouth daily. 05/21/16   Davonna Belling, MD    Family History Family History  Problem Relation Age of Onset  . Heart disease Mother   . Kidney disease Mother   . Diabetes Mother   . Other Father   . Diabetes Father   . Multiple sclerosis Daughter   . Multiple sclerosis Other   . Diabetes Sister   . Alcohol abuse Brother     ETOH and marijuana  . Diabetes Brother     Social History Social History  Substance Use Topics  . Smoking status: Former Smoker    Packs/day: 0.25    Years: 5.00    Types: Cigarettes    Quit date: 15  . Smokeless tobacco: Never Used  . Alcohol use Yes     Comment: socially only     Allergies   Ibuprofen; Prednisone; Solu-medrol [methylprednisolone]; Bactrim [sulfamethoxazole-trimethoprim]; Biaxin [clarithromycin]; and Naproxen   Review of Systems Review of Systems  Constitutional: Negative for appetite change.  HENT: Negative for congestion.   Respiratory: Negative for shortness of breath.   Gastrointestinal: Negative for abdominal pain.  Genitourinary: Negative for flank pain.  Musculoskeletal: Positive for back pain and neck pain.  Skin: Negative for wound.  Neurological: Positive for weakness and numbness. Negative for headaches.     Physical Exam Updated Vital Signs BP (!) 128/92   Pulse 90   Temp 97.6 F (36.4 C) (Oral)   Resp 18   SpO2 99%   Physical Exam  Constitutional: She appears well-developed.  HENT:  Head: Atraumatic.  Eyes: EOM are normal.  Neck: Neck supple.  Cardiovascular: Normal rate.   Pulmonary/Chest: Effort normal.    Abdominal: There is no tenderness.  Musculoskeletal: She exhibits tenderness.  Mild tenderness over lumbar spine. No deformity.  Neurological: She is alert.  Mild chronic right-sided facial droop. Good grip strength bilaterally. Sensation grossly intact over both hands. Good flexion-extension of the wrist. Subjective paresthesias to right hand. There is some tenderness with light touch to the skin on the right anterior lateral thigh. No skin changes. Sensation intact in the feet. Good flexion-extension of the knee and ankle. Somewhat decreased range of motion in the right leg which patient states is chronic. Able to move both upper extremities freely.  Skin: Skin is warm. Capillary refill takes less than 2 seconds.  Psychiatric: She has a normal mood and  affect.     ED Treatments / Results  Labs (all labs ordered are listed, but only abnormal results are displayed) Labs Reviewed - No data to display  EKG  EKG Interpretation None       Radiology No results found.  Procedures Procedures (including critical care time)  Medications Ordered in ED Medications - No data to display   Initial Impression / Assessment and Plan / ED Course  I have reviewed the triage vital signs and the nursing notes.  Pertinent labs & imaging results that were available during my care of the patient were reviewed by me and considered in my medical decision making (see chart for details).     Patient with right leg pain. Reviewed previous MRIs of brain and lumbar spine. Has some nonspecific neurologic disease also that has not really been clearly delineated. Thoughts of MS but there is some dispute between neurologists. Zoster considered. There is however no rash yet. We will start a low-dose 3 day course of steroids. Will discharge home to follow with her neurologist. Has had allergy to steroids in the past but has tolerated 10 mg of prednisone without difficulty.  Final Clinical Impressions(s) / ED  Diagnoses   Final diagnoses:  Paresthesias    New Prescriptions New Prescriptions   PREDNISONE (DELTASONE) 10 MG TABLET    Take 1 tablet (10 mg total) by mouth daily.     Davonna Belling, MD 05/21/16 0830

## 2016-05-21 NOTE — ED Notes (Signed)
E sign not working.  Pt verbalized understanding of all instructions.

## 2016-05-21 NOTE — ED Notes (Signed)
Pt states pain in right leg that began anterior thigh now traveling around to back of leg and travels to calf/foot area.  One area of anterolateral thigh very painful to even slightest touch.

## 2016-05-21 NOTE — ED Triage Notes (Addendum)
Pt reports numbness and swelling to "right side of body" for the past few months that she saw her pcp for. Reports today that she woke up with pain and burning in her right leg that she describes feeling like "electricity." reports hx of MS and fibromyalgia. pt ambulatory to triage, speech clear, no neuro deficits present.

## 2016-05-26 DIAGNOSIS — M79602 Pain in left arm: Secondary | ICD-10-CM | POA: Diagnosis not present

## 2016-05-27 ENCOUNTER — Other Ambulatory Visit: Payer: Self-pay

## 2016-05-27 ENCOUNTER — Telehealth: Payer: Self-pay | Admitting: Neurology

## 2016-05-27 ENCOUNTER — Encounter: Payer: Self-pay | Admitting: Vascular Surgery

## 2016-05-27 DIAGNOSIS — I739 Peripheral vascular disease, unspecified: Secondary | ICD-10-CM

## 2016-05-27 DIAGNOSIS — M79606 Pain in leg, unspecified: Secondary | ICD-10-CM

## 2016-05-27 NOTE — Telephone Encounter (Signed)
I have spoken with Courtney [name] this afternoon and given appt./fim

## 2016-05-27 NOTE — Telephone Encounter (Signed)
Patient called office in reference to requesting an appointment with Dr. Felecia Shelling.  Patient is having nerve cramps, pain.  Recently seen in ER and also with PCP suggesting patient be seen soon.  Please call

## 2016-06-03 ENCOUNTER — Encounter: Payer: Medicare Other | Attending: Family Medicine | Admitting: Dietician

## 2016-06-03 ENCOUNTER — Encounter: Payer: Self-pay | Admitting: Dietician

## 2016-06-03 DIAGNOSIS — E119 Type 2 diabetes mellitus without complications: Secondary | ICD-10-CM | POA: Diagnosis not present

## 2016-06-03 DIAGNOSIS — Z713 Dietary counseling and surveillance: Secondary | ICD-10-CM | POA: Diagnosis not present

## 2016-06-03 DIAGNOSIS — E118 Type 2 diabetes mellitus with unspecified complications: Secondary | ICD-10-CM

## 2016-06-03 DIAGNOSIS — Z794 Long term (current) use of insulin: Secondary | ICD-10-CM

## 2016-06-03 NOTE — Patient Instructions (Signed)
Consider armchair exercises or walking 15 minutes most days of the week. Consider eating more non-starchy vegetables. Consider other options to cereal more often.  Consider lunch options:  Open faced sandwich or 1/2 sandwich with fruit  Soup and 1/2 sandwich  Salad with grilled meat or cottage cheese or boiled egg, crackers, and fruit  Bean burrito and avocado  Plan:  Aim for 2-3 Carb Choices per meal (30-45 grams) +/- 1 either way  Aim for 0-1 Carbs per snack if hungry  Include protein in moderation with your meals and snacks Consider reading food labels for Total Carbohydrate and Fat Grams of foods Consider checking BG at alternate times per day as directed by MD  Consider taking medication as directed by MD

## 2016-06-03 NOTE — Progress Notes (Signed)
Diabetes Self-Management Education  Visit Type: First/Initial  Appt. Start Time: 1410 Appt. End Time: 3151  06/03/2016  [name], identified by name and date of birth, is a 61 y.o. female with a diagnosis of Diabetes: Type 2. Other hx includes MS, fibromyalgia, HTN, GERD, migrains, anxiety and depression.  She is a high fall risk and falls frequently but without injury.   Medications include Novolog 70/30 24 units each am and 29 units each pm.  She has stopped taking the Victoza as "I think I got sick."  Patient lives with her husband.  They share shopping and cooking.  They are from [place].    ASSESSMENT  Height 5\' 3"  (1.6 m), weight 173 lb (78.5 kg). Body mass index is 30.65 kg/m.  She reports weight gain of 30 lbs 2 years ago on prednisone.      Diabetes Self-Management Education - 06/03/16 1425      Visit Information   Visit Type First/Initial     Initial Visit   Diabetes Type Type 2   Are you currently following a meal plan? No   Are you taking your medications as prescribed? Yes   Date Diagnosed 2006?     Health Coping   How would you rate your overall health? Poor     Psychosocial Assessment   Patient Belief/Attitude about Diabetes Other (comment)  "Hate it"   Self-care barriers Unsteady gait/risk for falls;Debilitated state due to current medical condition   Self-management support Doctor's office;Family   Other persons present Patient;Spouse/SO   Patient Concerns Nutrition/Meal planning;Weight Control   Special Needs None   Preferred Learning Style No preference indicated   Learning Readiness Ready   How often do you need to have someone help you when you read instructions, pamphlets, or other written materials from your doctor or pharmacy? 1 - Never   What is the last grade level you completed in school? 12th grade     Pre-Education Assessment   Patient understands the diabetes disease and treatment process. Needs Review   Patient  understands incorporating nutritional management into lifestyle. Needs Review   Patient undertands incorporating physical activity into lifestyle. Needs Review   Patient understands using medications safely. Needs Review   Patient understands monitoring blood glucose, interpreting and using results Needs Review   Patient understands prevention, detection, and treatment of acute complications. Needs Review   Patient understands prevention, detection, and treatment of chronic complications. Needs Review   Patient understands how to develop strategies to address psychosocial issues. Needs Review   Patient understands how to develop strategies to promote health/change behavior. Needs Review     Complications   Last HgB A1C per patient/outside source 7.2 %  02/01/16   How often do you check your blood sugar? 3-4 times / week   Fasting Blood glucose range (mg/dL) 70-129;130-179;180-200;>200;<70   Postprandial Blood glucose range (mg/dL) 130-179   Number of hypoglycemic episodes per month 1   Can you tell when your blood sugar is low? Yes   What do you do if your blood sugar is low? eat 4-5 hard candy   Number of hyperglycemic episodes per week 5   Can you tell when your blood sugar is high? Yes   What do you do if your blood sugar is high? take extra insulin according to sliding scale   Have you had a dilated eye exam in the past 12 months? Yes   Have you had a dental exam in the past  12 months? Yes   Are you checking your feet? Yes   How many days per week are you checking your feet? 2     Dietary Intake   Breakfast banana  5:30-6   Snack (morning) cereal and Lactaid skim milk  8:30-12   Lunch none   Snack (afternoon) fiber one bar OR cheese and crackers OR 1/2 sandwich with avocado   Dinner protein, starch, vegetable  6   Snack (evening) diet greek yogurt  8-9   Beverage(s) water, unsweetened tea, rare juice      Exercise   Exercise Type Light (walking / raking leaves);ADL's   walks when warmer   How many days per week to you exercise? 3   How many minutes per day do you exercise? 15   Total minutes per week of exercise 45     Patient Education   Previous Diabetes Education Yes (please comment)  9 years ago   Disease state  Definition of diabetes, type 1 and 2, and the diagnosis of diabetes   Nutrition management  Role of diet in the treatment of diabetes and the relationship between the three main macronutrients and blood glucose level;Food label reading, portion sizes and measuring food.;Meal options for control of blood glucose level and chronic complications.;Information on hints to eating out and maintain blood glucose control.   Physical activity and exercise  Role of exercise on diabetes management, blood pressure control and cardiac health.;Helped patient identify appropriate exercises in relation to his/her diabetes, diabetes complications and other health issue.   Medications Reviewed patients medication for diabetes, action, purpose, timing of dose and side effects.   Monitoring Purpose and frequency of SMBG.;Identified appropriate SMBG and/or A1C goals.;Daily foot exams   Acute complications Taught treatment of hypoglycemia - the 15 rule.   Chronic complications Relationship between chronic complications and blood glucose control;Dental care   Psychosocial adjustment Worked with patient to identify barriers to care and solutions   Personal strategies to promote health Lifestyle issues that need to be addressed for better diabetes care     Individualized Goals (developed by patient)   Nutrition General guidelines for healthy choices and portions discussed   Physical Activity Exercise 5-7 days per week   Medications take my medication as prescribed   Monitoring  test my blood glucose as discussed   Reducing Risk examine blood glucose patterns;do foot checks daily   Health Coping discuss diabetes with (comment)  MD/RD     Post-Education Assessment    Patient understands the diabetes disease and treatment process. Demonstrates understanding / competency   Patient understands incorporating nutritional management into lifestyle. Needs Review   Patient undertands incorporating physical activity into lifestyle. Needs Review   Patient understands using medications safely. Demonstrates understanding / competency   Patient understands monitoring blood glucose, interpreting and using results Demonstrates understanding / competency   Patient understands prevention, detection, and treatment of acute complications. Demonstrates understanding / competency   Patient understands prevention, detection, and treatment of chronic complications. Demonstrates understanding / competency   Patient understands how to develop strategies to address psychosocial issues. Needs Review   Patient understands how to develop strategies to promote health/change behavior. Needs Review     Outcomes   Future DMSE 4-6 wks   Program Status Completed      Individualized Plan for Diabetes Self-Management Training:   Learning Objective:  Patient will have a greater understanding of diabetes self-management. Patient education plan is to attend individual and/or group sessions per assessed  needs and concerns.   Plan:   Patient Instructions  Consider armchair exercises or walking 15 minutes most days of the week. Consider eating more non-starchy vegetables. Consider other options to cereal more often.  Consider lunch options:  Open faced sandwich or 1/2 sandwich with fruit  Soup and 1/2 sandwich  Salad with grilled meat or cottage cheese or boiled egg, crackers, and fruit  Bean burrito and avocado  Plan:  Aim for 2-3 Carb Choices per meal (30-45 grams) +/- 1 either way  Aim for 0-1 Carbs per snack if hungry  Include protein in moderation with your meals and snacks Consider reading food labels for Total Carbohydrate and Fat Grams of foods Consider checking BG at  alternate times per day as directed by MD  Consider taking medication as directed by MD      Expected Outcomes:   Patient demonstrated interest in learning but difficulty making changes due to multiple chronic medical problems and acceptance of diabetes.  Education material provided: Living Well with Diabetes, Food label handouts, A1C conversion sheet, Meal plan card, My Plate and Snack sheet  If problems or questions, patient to contact team via:  Phone and Email  Future DSME appointment: 4-6 wks

## 2016-06-06 ENCOUNTER — Encounter: Payer: Self-pay | Admitting: Vascular Surgery

## 2016-06-06 ENCOUNTER — Ambulatory Visit (INDEPENDENT_AMBULATORY_CARE_PROVIDER_SITE_OTHER): Payer: Medicare Other | Admitting: Vascular Surgery

## 2016-06-06 ENCOUNTER — Ambulatory Visit (HOSPITAL_COMMUNITY)
Admission: RE | Admit: 2016-06-06 | Discharge: 2016-06-06 | Disposition: A | Discharge status: Home / Self Care | Payer: Medicare Other | Source: Ambulatory Visit | Attending: Vascular Surgery | Admitting: Vascular Surgery

## 2016-06-06 VITALS — BP 124/81 | HR 79 | Temp 98.3°F | Resp 16 | Ht 63.0 in | Wt 172.0 lb

## 2016-06-06 DIAGNOSIS — M79606 Pain in leg, unspecified: Secondary | ICD-10-CM | POA: Diagnosis not present

## 2016-06-06 DIAGNOSIS — Z87891 Personal history of nicotine dependence: Secondary | ICD-10-CM | POA: Insufficient documentation

## 2016-06-06 DIAGNOSIS — E1151 Type 2 diabetes mellitus with diabetic peripheral angiopathy without gangrene: Secondary | ICD-10-CM | POA: Insufficient documentation

## 2016-06-06 DIAGNOSIS — M79651 Pain in right thigh: Secondary | ICD-10-CM | POA: Diagnosis not present

## 2016-06-06 DIAGNOSIS — I10 Essential (primary) hypertension: Secondary | ICD-10-CM | POA: Diagnosis not present

## 2016-06-06 DIAGNOSIS — I739 Peripheral vascular disease, unspecified: Secondary | ICD-10-CM

## 2016-06-06 NOTE — Progress Notes (Signed)
Vitals:   06/06/16 1239  BP: (!) 151/86  Pulse: 79  Resp: 16  Temp: 98.3 F (36.8 C)  SpO2: 99%  Weight: 172 lb (78 kg)  Height: 5\' 3"  (1.6 m)

## 2016-06-06 NOTE — Progress Notes (Signed)
Patient ID: Courtney [name], female   DOB: 02/04/1956, 61 y.o.   MRN: [id number]  Reason for Consult: New Evaluation (leg pain)   Referred by Brandy Hale, MD  Subjective:     HPI:  Courtney [name] is a 61 y.o. female presents today for evaluation of right lower extremity pain. She is a history of multiple sclerosis, diabetes, and fibromyalgia. She has never had vascular surgery before. She states that the pain is in the right lateral aspect of her thigh. She is seen in the pain clinic for the pain medicine does not alleviate the pain. The pain is constant and does radiate some down her leg. She has no back or hip pain. Denies antecedent trauma. She does not have any bruising or swelling of the leg. She has not found any alleviating factors. Pain hurts during sleep and occasionally when walking but she does not demonstrate any exacerbating factors other she denies any fevers chills or night sweats. She does have some chronic fatigue. She has never had a stroke or TIA. She has not had any coronary history in the past. She has sent here by [name].  Past Medical History:  Diagnosis Date  . Asthma   . Diabetes mellitus without complication ([place])   . Diverticulitis   . Fibromyalgia   . GERD (gastroesophageal reflux disease)   . Hypertension   . Migraines   . MS (multiple sclerosis) ([place])   . Ulcer ([place])   . Vision abnormalities    Family History  Problem Relation Age of Onset  . Heart disease Mother   . Kidney disease Mother   . Diabetes Mother   . Other Father   . Diabetes Father   . Multiple sclerosis Daughter   . Multiple sclerosis Other   . Diabetes Sister   . Alcohol abuse Brother     ETOH and marijuana  . Diabetes Brother    Past Surgical History:  Procedure Laterality Date  . ABDOMINAL HYSTERECTOMY  1976   including oophorectomy, due to uterine cancer  . APPENDECTOMY  1974  . CHOLECYSTECTOMY     2006  . COLON SURGERY    . [facility]    .  KNEE SURGERY      Short Social History:  Social History  Substance Use Topics  . Smoking status: Former Smoker    Packs/day: 0.25    Years: 5.00    Types: Cigarettes    Quit date: 26  . Smokeless tobacco: Never Used  . Alcohol use Yes     Comment: socially only    Allergies  Allergen Reactions  . Ibuprofen Other (See Comments)    Developed a ulcer   . Prednisone Anaphylaxis, Shortness Of Breath and Palpitations  . Solu-Medrol [Methylprednisolone] Anaphylaxis, Shortness Of Breath and Palpitations  . Bactrim [Sulfamethoxazole-Trimethoprim] Other (See Comments)    "blotchiness" redness, face swelling  . Biaxin [Clarithromycin] Swelling  . Naproxen Other (See Comments)    Developed a ulcer     Current Outpatient Prescriptions  Medication Sig Dispense Refill  . albuterol (PROVENTIL HFA;VENTOLIN HFA) 108 (90 Base) MCG/ACT inhaler Inhale 2 puffs into the lungs every 6 (six) hours as needed for wheezing or shortness of breath. 3 Inhaler 3  . alprazolam (XANAX) 2 MG tablet Take 2 mg by mouth 3 (three) times daily as needed for anxiety.     Marland Kitchen amitriptyline (ELAVIL) 25 MG tablet Take 1 tablet (25 mg total) by mouth at bedtime. 90 tablet  3  . Biotin 1000 MCG tablet Take 1,000 mcg by mouth daily.    . cyclobenzaprine (FLEXERIL) 5 MG tablet Take 1 tablet by mouth twice daily as needed for spasms  2  . insulin aspart protamine- aspart (NOVOLOG MIX 70/30) (70-30) 100 UNIT/ML injection Inject 0.26-0.3 mLs (26-30 Units total) into the skin 2 (two) times daily with a meal. 20 units in am, 26 units in pm 10 mL 0  . interferon beta-1a (AVONEX) 30 MCG/0.5ML PSKT injection Inject 30 mcg into the muscle every 7 (seven) days.    Marland Kitchen lamoTRIgine (LAMICTAL) 100 MG tablet Take 1 tablet (100 mg total) by mouth 2 (two) times daily. 180 tablet 3  . lidocaine (XYLOCAINE) 5 % ointment Apply 1 application topically daily as needed for mild pain.    Marland Kitchen liraglutide (VICTOZA) 18 MG/3ML SOPN Inject 1.8 mg into the  skin daily.    . naloxone (NARCAN) nasal spray 4 mg/0.1 mL Place 1 spray into the nose daily as needed (overdose).    Marland Kitchen oxyCODONE (ROXICODONE) 15 MG immediate release tablet Take 1 tablet by mouth 5 times daily as needed for pain  0  . OXYCONTIN 30 MG 12 hr tablet Take 1 tablet(s) by mouth EVERY TWELVE HOURS]  0  . predniSONE (DELTASONE) 10 MG tablet Take 1 tablet (10 mg total) by mouth daily. 3 tablet 0  . Telmisartan-Amlodipine 40-5 MG TABS Take 0.5 tablets by mouth every other day.      No current facility-administered medications for this visit.     Review of Systems  Constitutional:  Constitutional negative. HENT: HENT negative.  Eyes: Eyes negative.  Respiratory: Respiratory negative.  Cardiovascular: Cardiovascular negative.  GI: Gastrointestinal negative.  Musculoskeletal: Positive for leg pain and myalgias.  Neurological: Positive for focal weakness and numbness.  Hematologic: Hematologic/lymphatic negative.  Psychiatric: Psychiatric negative.        Objective:  Objective   Vitals:   06/06/16 1239 06/06/16 1242  BP: (!) 151/86 124/81  Pulse: 79 79  Resp: 16   Temp: 98.3 F (36.8 C)   SpO2: 99%   Weight: 172 lb (78 kg)   Height: 5\' 3"  (1.6 m)    Body mass index is 30.47 kg/m.  Physical Exam  Constitutional: She appears well-developed.  Eyes: Pupils are equal, round, and reactive to light.  Neck: Normal range of motion.  Cardiovascular: Normal rate.   Pulses:      Dorsalis pedis pulses are 2+ on the right side, and 2+ on the left side.  Pulmonary/Chest: Effort normal.  Abdominal: Soft.  Musculoskeletal: Normal range of motion.  Neurological: She is alert.  Skin: Skin is warm and dry.  Psychiatric: She has a normal mood and affect. Her behavior is normal. Judgment and thought content normal.    Data: I independently interpreted her follow extremity ABIs which are 1 bilaterally with triphasic waveforms.     Assessment/Plan:     61 year old female  presents for evaluation of right lower extremity pain that is in her lateral thigh radiates down the lateral aspect of her leg without any history of trauma or vascular abnormality. As such there is no vascular intervention is required and she can follow-up on an as-needed basis.     Waynetta Sandy MD [facility]

## 2016-06-09 ENCOUNTER — Encounter: Payer: Self-pay | Admitting: Interventional Cardiology

## 2016-06-09 DIAGNOSIS — M542 Cervicalgia: Secondary | ICD-10-CM | POA: Diagnosis not present

## 2016-06-09 DIAGNOSIS — G894 Chronic pain syndrome: Secondary | ICD-10-CM | POA: Diagnosis not present

## 2016-06-09 DIAGNOSIS — M797 Fibromyalgia: Secondary | ICD-10-CM | POA: Diagnosis not present

## 2016-06-09 DIAGNOSIS — M25519 Pain in unspecified shoulder: Secondary | ICD-10-CM | POA: Diagnosis not present

## 2016-06-09 DIAGNOSIS — M79606 Pain in leg, unspecified: Secondary | ICD-10-CM | POA: Diagnosis not present

## 2016-06-09 DIAGNOSIS — M79643 Pain in unspecified hand: Secondary | ICD-10-CM | POA: Diagnosis not present

## 2016-06-13 ENCOUNTER — Encounter: Payer: Self-pay | Admitting: Neurology

## 2016-06-13 ENCOUNTER — Ambulatory Visit (INDEPENDENT_AMBULATORY_CARE_PROVIDER_SITE_OTHER): Payer: Medicare Other | Admitting: Neurology

## 2016-06-13 VITALS — BP 132/82 | HR 88 | Ht 63.0 in | Wt 172.5 lb

## 2016-06-13 DIAGNOSIS — R269 Unspecified abnormalities of gait and mobility: Secondary | ICD-10-CM

## 2016-06-13 DIAGNOSIS — M797 Fibromyalgia: Secondary | ICD-10-CM

## 2016-06-13 DIAGNOSIS — F418 Other specified anxiety disorders: Secondary | ICD-10-CM

## 2016-06-13 DIAGNOSIS — R5383 Other fatigue: Secondary | ICD-10-CM | POA: Diagnosis not present

## 2016-06-13 DIAGNOSIS — R3911 Hesitancy of micturition: Secondary | ICD-10-CM | POA: Diagnosis not present

## 2016-06-13 DIAGNOSIS — G4710 Hypersomnia, unspecified: Secondary | ICD-10-CM | POA: Diagnosis not present

## 2016-06-13 DIAGNOSIS — G4700 Insomnia, unspecified: Secondary | ICD-10-CM

## 2016-06-13 DIAGNOSIS — Z794 Long term (current) use of insulin: Secondary | ICD-10-CM

## 2016-06-13 DIAGNOSIS — E118 Type 2 diabetes mellitus with unspecified complications: Secondary | ICD-10-CM

## 2016-06-13 MED ORDER — BACLOFEN 10 MG PO TABS: ORAL_TABLET | ORAL | 5 refills | Status: DC

## 2016-06-13 NOTE — Progress Notes (Signed)
[facility]  PATIENT: Courtney [name] DOB: 02/13/1956  REFERRING DOCTOR OR PCP:  Lucianne Lei SOURCE: Patient, MRI images on CD, MRI reports.  _________________________________   HISTORICAL  CHIEF COMPLAINT:  Chief Complaint  Patient presents with  . Fibromyalgia    Sts. she continues to experiencre pain in legs.  Doesn't think Lamictal helps./fim    HISTORY OF PRESENT ILLNESS:  Courtney [name] is a 61 yo woman with fibromyalgia and a mildly abnormal MRI who was diagnosed with MS in 2013 but reports symptoms since 2006.   Fibromyalgia: She has pain all over her body in the muscles, joints and skin. She feels that the joint was sometimes lock up. She notes that her symptoms are worse when she lays down at night.   She was diagnosed with fibromyalgia in the past and was placed on Neurontin, Lyrica and Cymbalta at various times.   She felt no benefit.   She is been on multiple muscle relaxants in the past and thinks she was on baclofen, Zanaflex and Flexeril.   She is currently on Oxycontin and oxycodone (sees Dr. Mirna Mires for Pain Management and Dr. Doree Fudge).      She has seen rheumatology in the past and was told she did not have RA.   At the last visit, lamotrigine was started.   She does not think it has helped any.   She reports a lot of leg pain up to the hip right side > left side.    She is not exercising.   They report it made her pain worse.      Her pain is often worse at night and she wakes up screaming in pain.   Her husband tries to use warm compresses and massage, but with little benefit.      Gait/strength/sensation: She reports difficulty with her gait.  She uses a scooter for long walks.   She states her legs give out if she walks further.  She feels she is dragging her right foot drags She has numbness in the right leg and more weakness in the left leg.  Bladder: She reports some urinary hesitancy and frequency. She has had urinary tract  infections.   Vision:   She notes the left eye is jumping around some.   She notes diplopia at times.   Fatigue/sleep: She is always feeling tired.    She also notes that she has insomnia, with difficulty falling asleep and staying asleep.    For insomnia, she has been on Ambien, Lunesta, Belsomra and trazodone.  She only felt they helped for a few days and then stopped working.   She snores but does not have pauses.    A PSG in 2017 showed minimal OSA by report (AHI = 2.3).    Mood/cognition: She feels she is not too depressed but is apathetic and has flat affect.   She cries a lot.  She is sometimes irritable.   She states she has been treated with multiple antidepressants but they have not been of much benefit. She also takes Xanax 2 mg 3 times a day for the anxiety.    She also reports difficulty with her cognition, especially short-term memory.   In [place], she was seeing a psychiatrist.     She has insulin dependent type 2 DM  Questionable MS history:  She presented with hot sensations in different parts of the body and also noted some trouble with her gait and balance.  She was initially diagnosed with Parkinson's disease and started on Mirapex.    Around 2013, she reports numbness in her entire right side. More MRIs were performed.    I reviewed several of the studiesa at her last visit The MRI of the brain 11/24/11 shows a couple tiny T2/FLAIR hyperintense foci in the hemispheres which would be normal for age. The MRIs of the spine 11/24/11 did not show any abnormality within the spinal cord.   Reports of MRIs in 2014 2015 ordered a couple of small white matter foci that were unchanged.   In 2013, she was seen by Dr. Mart Piggs at [place] in [place] and diagnosed with multiple sclerosis.  She also had a lumbar puncture June 2015. We don't have the results..  She started Copaxone.     She then was placed on Plegridy after she had difficulties with the Copaxone due to site  reactions. She had injection site reactions with Plegridy as well and was on Avonex until I last saw her in 2016.   I was not certain of the diagnosis based on my interpretation of history and MRI and suggested a follow up MRI which she did not do.   She ran out of Avonex in late 2016 and has not been on any DMT since then.  She continue to report right > left symptoms, fatigue, pain.   MRI of the cervical spine 12/18/15 was personally reviewed and shows mild spinal stenosis and right foraminal narrowing at C4C5.   The spinal cord is normal    REVIEW OF SYSTEMS: Constitutional: No fevers, chills, sweats, or change in appetite.   She has fatigue and poor sleep. Eyes: No visual changes, double vision, eye pain Ear, nose and throat: No hearing loss, ear pain, nasal congestion, sore throat Cardiovascular: No chest pain, palpitations Respiratory: No shortness of breath at rest or with exertion.   No wheezes.  She snores. PSG in the past was reportedly fairly normal GastrointestinaI: No nausea, vomiting, abdominal pain, fecal incontinence.  She reports diarrhea. Genitourinary:   She reports urinary frequency and hesitancy. No nocturia. Musculoskeletal: see above Integumentary: No rash, pruritus, skin lesions Neurological: as above Psychiatric: see above. Endocrine: No palpitations, diaphoresis, change in appetite, change in weigh or increased thirst Hematologic/Lymphatic: No anemia, purpura, petechiae. Allergic/Immunologic: No itchy/runny eyes, nasal congestion, recent allergic reactions, rashes  ALLERGIES: Allergies  Allergen Reactions  . Ibuprofen Other (See Comments)    Developed a ulcer   . Prednisone Anaphylaxis, Shortness Of Breath and Palpitations  . Solu-Medrol [Methylprednisolone] Anaphylaxis, Shortness Of Breath and Palpitations  . Bactrim [Sulfamethoxazole-Trimethoprim] Other (See Comments)    "blotchiness" redness, face swelling  . Biaxin [Clarithromycin] Swelling  . Naproxen  Other (See Comments)    Developed a ulcer     HOME MEDICATIONS:  Current Outpatient Prescriptions:  .  albuterol (PROVENTIL HFA;VENTOLIN HFA) 108 (90 Base) MCG/ACT inhaler, Inhale 2 puffs into the lungs every 6 (six) hours as needed for wheezing or shortness of breath., Disp: 3 Inhaler, Rfl: 3 .  alprazolam (XANAX) 2 MG tablet, Take 2 mg by mouth 3 (three) times daily as needed for anxiety. , Disp: , Rfl:  .  amitriptyline (ELAVIL) 25 MG tablet, Take 1 tablet (25 mg total) by mouth at bedtime., Disp: 90 tablet, Rfl: 3 .  Biotin 1000 MCG tablet, Take 1,000 mcg by mouth daily., Disp: , Rfl:  .  cyclobenzaprine (FLEXERIL) 5 MG tablet, Take 1 tablet by mouth twice daily as  needed for spasms, Disp: , Rfl: 2 .  insulin aspart protamine- aspart (NOVOLOG MIX 70/30) (70-30) 100 UNIT/ML injection, Inject 0.26-0.3 mLs (26-30 Units total) into the skin 2 (two) times daily with a meal. 20 units in am, 26 units in pm, Disp: 10 mL, Rfl: 0 .  lamoTRIgine (LAMICTAL) 100 MG tablet, Take 1 tablet (100 mg total) by mouth 2 (two) times daily., Disp: 180 tablet, Rfl: 3 .  lidocaine (XYLOCAINE) 5 % ointment, Apply 1 application topically daily as needed for mild pain., Disp: , Rfl:  .  naloxone (NARCAN) nasal spray 4 mg/0.1 mL, Place 1 spray into the nose daily as needed (overdose)., Disp: , Rfl:  .  oxyCODONE (ROXICODONE) 15 MG immediate release tablet, Take 1 tablet by mouth 5 times daily as needed for pain, Disp: , Rfl: 0 .  OXYCONTIN 30 MG 12 hr tablet, Take 1 tablet(s) by mouth EVERY TWELVE HOURS], Disp: , Rfl: 0 .  Telmisartan-Amlodipine 40-5 MG TABS, Take 0.5 tablets by mouth every other day. , Disp: , Rfl:  .  baclofen (LIORESAL) 10 MG tablet, Take 1 to 3 pills at night, Disp: 90 each, Rfl: 5 .  liraglutide (VICTOZA) 18 MG/3ML SOPN, Inject 1.8 mg into the skin daily., Disp: , Rfl:  .  predniSONE (DELTASONE) 10 MG tablet, Take 1 tablet (10 mg total) by mouth daily. (Patient not taking: Reported on 06/13/2016),  Disp: 3 tablet, Rfl: 0  PAST MEDICAL HISTORY: Past Medical History:  Diagnosis Date  . Asthma   . Diabetes mellitus without complication ([place])   . Diverticulitis   . Fibromyalgia   . GERD (gastroesophageal reflux disease)   . Hypertension   . Migraines   . MS (multiple sclerosis) ([place])   . Ulcer ([place])   . Vision abnormalities     PAST SURGICAL HISTORY: Past Surgical History:  Procedure Laterality Date  . ABDOMINAL HYSTERECTOMY  1976   including oophorectomy, due to uterine cancer  . APPENDECTOMY  1974  . CHOLECYSTECTOMY     2006  . COLON SURGERY    . CTR    . KNEE SURGERY      FAMILY HISTORY: Family History  Problem Relation Age of Onset  . Heart disease Mother   . Kidney disease Mother   . Diabetes Mother   . Other Father   . Diabetes Father   . Multiple sclerosis Daughter   . Multiple sclerosis Other   . Diabetes Sister   . Alcohol abuse Brother     ETOH and marijuana  . Diabetes Brother     SOCIAL HISTORY:  Social History   Social History  . Marital status: Married    Spouse name: N/A  . Number of children: N/A  . Years of education: N/A   Occupational History  . Not on file.   Social History Main Topics  . Smoking status: Former Smoker    Packs/day: 0.25    Years: 5.00    Types: Cigarettes    Quit date: 72  . Smokeless tobacco: Never Used  . Alcohol use Yes     Comment: socially only  . Drug use: No  . Sexual activity: Not on file   Other Topics Concern  . Not on file   Social History Narrative  . No narrative on file     PHYSICAL EXAM  Vitals:   06/13/16 1015  BP: 132/82  Pulse: 88  Weight: 172 lb 8 oz (78.2 kg)  Height: 5\' 3"  (1.6 m)  Body mass index is 30.56 kg/m.   General: The patient is well-developed and well-nourished and in no acute distress  Eyes:  Funduscopic exam shows normal optic discs and retinal vessels.  Neck: The neck is supple, no carotid bruits are noted.  Range of motion is reasonably normal.  She is tender in the paraspinal muscles, trapezius and rhomboids  Skin: Extremities are without rash or edema.  Musculoskeletal:  She is tender most of the classic fibromyalgia tender points   Neurologic Exam  Mental status: The patient is alert and oriented x 3 at the time of the examination. The patient has apparent normal recent and remote memory, with an apparently normal attention span and concentration ability.   Speech is normal.  Cranial nerves: Extraocular movements are full. Pupils are equal, round, and reactive to light and accomodation.    Facial symmetry is present. There is good facial sensation to soft touch bilaterally.Facial strength is normal.  Trapezius and sternocleidomastoid strength is normal. No dysarthria is noted.  The tongue is midline, and the patient has symmetric elevation of the soft palate. No obvious hearing deficits are noted.  Motor:  She has a fast tremor in her hands with some distractibility.   Muscle bulk is normal.   Tone is normal. Strength is  5 / 5 in all 4 extremities.   Sensory: She reports decreased sensation to touch and sensation on the left side.  Coordination: Cerebellar testing reveals good finger-nose-finger bilaterally.  Gait and station: Station is normal.   Gait is arthritic and she uses a cane. Tandem gait is mildly wide. Romberg is negative.   Reflexes: Deep tendon reflexes are symmetric and normal bilaterally.        DIAGNOSTIC DATA (LABS, IMAGING, TESTING) - I reviewed patient records, labs, notes, testing and imaging myself where available.  Lab Results  Component Value Date   WBC 4.2 02/03/2015   HGB 11.9 (L) 02/03/2015   HCT 35.8 (L) 02/03/2015   MCV 86.1 02/03/2015   PLT 147 (L) 02/03/2015      Component Value Date/Time   NA 139 01/08/2016 1108   K 3.8 01/08/2016 1108   CL 101 01/08/2016 1108   CO2 30 01/08/2016 1108   GLUCOSE 143 (H) 01/08/2016 1108   BUN 14 01/08/2016 1108   CREATININE 0.96 01/08/2016 1108    CALCIUM 9.7 01/08/2016 1108   PROT 7.9 01/08/2016 1108   ALBUMIN 4.7 01/08/2016 1108   AST 29 01/08/2016 1108   ALT 30 01/08/2016 1108   ALKPHOS 77 01/08/2016 1108   BILITOT 0.5 01/08/2016 1108   GFRNONAA >60 02/03/2015 1145   GFRAA >60 02/03/2015 1145       ASSESSMENT AND PLAN  Fibromyalgia - Plan: Ambulatory referral to Psychiatry  Depression with anxiety - Plan: Ambulatory referral to Psychiatry  Type 2 diabetes mellitus with complication, with long-term current use of insulin (HCC)  Gait difficulty  Hypersomnia  Insomnia, unspecified type  Other fatigue  Urinary hesitancy   1.   Baclofen 10-30 mg nightly for painful nght spasms.  If not better, consider higher dose of Lyrica. 2.   We discussed an oral appliance for snoring.  She will think about it. 3.   Refer to Behavioral health for depression chronic pain syndrome. 4.    Changes in her MRI are normal for age and she has no evidence of MS. 5.   RTC 6 months   Ocean Kearley A. Felecia Shelling, MD, PhD 9/62/9528, 41:32 PM Certified in Neurology, Clinical Neurophysiology,  Sleep Medicine, Pain Medicine and Neuroimaging  [facility] [address] [place], [place] [address] [phone]

## 2016-06-24 ENCOUNTER — Encounter: Payer: Self-pay | Admitting: Neurology

## 2016-07-03 ENCOUNTER — Ambulatory Visit: Payer: Medicare Other | Admitting: Neurology

## 2016-07-07 DIAGNOSIS — M542 Cervicalgia: Secondary | ICD-10-CM | POA: Diagnosis not present

## 2016-07-07 DIAGNOSIS — M79606 Pain in leg, unspecified: Secondary | ICD-10-CM | POA: Diagnosis not present

## 2016-07-07 DIAGNOSIS — G894 Chronic pain syndrome: Secondary | ICD-10-CM | POA: Diagnosis not present

## 2016-07-07 DIAGNOSIS — M545 Low back pain: Secondary | ICD-10-CM | POA: Diagnosis not present

## 2016-07-07 DIAGNOSIS — M25519 Pain in unspecified shoulder: Secondary | ICD-10-CM | POA: Diagnosis not present

## 2016-07-10 DIAGNOSIS — H5010 Unspecified exotropia: Secondary | ICD-10-CM | POA: Diagnosis not present

## 2016-07-10 DIAGNOSIS — E119 Type 2 diabetes mellitus without complications: Secondary | ICD-10-CM | POA: Diagnosis not present

## 2016-07-10 DIAGNOSIS — H2513 Age-related nuclear cataract, bilateral: Secondary | ICD-10-CM | POA: Diagnosis not present

## 2016-07-10 DIAGNOSIS — H43393 Other vitreous opacities, bilateral: Secondary | ICD-10-CM | POA: Diagnosis not present

## 2016-07-10 DIAGNOSIS — H469 Unspecified optic neuritis: Secondary | ICD-10-CM | POA: Diagnosis not present

## 2016-07-21 ENCOUNTER — Encounter (HOSPITAL_COMMUNITY): Payer: Self-pay | Admitting: Licensed Clinical Social Worker

## 2016-07-21 ENCOUNTER — Ambulatory Visit (INDEPENDENT_AMBULATORY_CARE_PROVIDER_SITE_OTHER): Payer: Medicare Other | Admitting: Licensed Clinical Social Worker

## 2016-07-21 DIAGNOSIS — F418 Other specified anxiety disorders: Secondary | ICD-10-CM | POA: Diagnosis not present

## 2016-07-21 NOTE — Progress Notes (Signed)
Comprehensive Clinical Assessment (CCA) Note  07/21/2016 Courtney [name] [id number]  Visit Diagnosis:      ICD-9-CM ICD-10-CM   1. Depression with anxiety 300.4 F41.8       CCA Part One  Part One has been completed on paper by the patient.  (See scanned document in Chart Review)  CCA Part Two A  Intake/Chief Complaint:  CCA Intake With Chief Complaint CCA Part Two Date: 07/21/16 CCA Part Two Time: 1311 Chief Complaint/Presenting Problem: Pt is being referred for therapy due to pain managment, MS, memory loss, fibromyalgia Patients Currently Reported Symptoms/Problems: Pt recently moved to [place] but is originally from [place] is on disability and husband is retired. Pt has anxiety and depression, pain, tremors, sleep issues, memory problems. Pt was dx with MS in 2008. Collateral Involvement: None Individual's Strengths: Good family support, desire to feel better Individual's Preferences: prefers to not have MS nor chronic pain Individual's Abilities: ability to work a program of recovery Type of Services Patient Feels Are Needed: outpatient  Mental Health Symptoms Depression:  Depression: Change in energy/activity, Difficulty Concentrating, Fatigue, Sleep (too much or little), Increase/decrease in appetite, Irritability, Tearfulness  Mania:     Anxiety:   Anxiety: Difficulty concentrating, Fatigue, Irritability, Sleep, Restlessness, Tension, Worrying  Psychosis:     Trauma:     Obsessions:     Compulsions:     Inattention:     Hyperactivity/Impulsivity:     Oppositional/Defiant Behaviors:     Borderline Personality:     Other Mood/Personality Symptoms:      Mental Status Exam Appearance and self-care  Stature:  Stature: Average  Weight:  Weight: Average weight  Clothing:  Clothing: Casual  Grooming:  Grooming: Normal  Cosmetic use:  Cosmetic Use: Age appropriate  Posture/gait:  Posture/Gait: Other (Comment) (walks slowly due to medical issues)  Motor activity:  Motor  Activity: Slowed  Sensorium  Attention:  Attention: Distractible  Concentration:  Concentration: Anxiety interferes  Orientation:  Orientation: X5  Recall/memory:  Recall/Memory: Defective in short-term, Defective in Recent  Affect and Mood  Affect:  Affect: Blunted  Mood:  Mood: Depressed  Relating  Eye contact:  Eye Contact: Normal  Facial expression:  Facial Expression: Depressed  Attitude toward examiner:  Attitude Toward Examiner: Cooperative  Thought and Language  Speech flow: Speech Flow: Soft  Thought content:  Thought Content: Appropriate to mood and circumstances  Preoccupation:     Hallucinations:     Organization:     Transport planner of Knowledge:  Fund of Knowledge: Impoverished by:  (Comment)  Intelligence:  Intelligence: Average  Abstraction:  Abstraction: Normal  Judgement:  Judgement: Normal  Reality Testing:  Reality Testing: Realistic  Insight:  Insight: Good  Decision Making:  Decision Making: Normal  Social Functioning  Social Maturity:  Social Maturity: Responsible  Social Judgement:  Social Judgement: Normal  Stress  Stressors:  Stressors: Illness  Coping Ability:  Coping Ability:  (doesn't feel well enough to cope with illnesses)  Skill Deficits:     Supports:      Family and Psychosocial History: Family history Marital status: Married Number of Years Married: 25 Does patient have children?: Yes How many children?: 2 How is patient's relationship with their children?: excellent, both daughters still live in [place]  Childhood History:  Childhood History By whom was/is the patient raised?: Mother Additional childhood history information: father died when pt was 20 years old Description of patient's relationship with caregiver when they were a  child: doesn't remember father, sad because she didn't get to know him, really good relationship with my mom Patient's description of current relationship with people who raised him/her: both deceased   How were you disciplined when you got in trouble as a child/adolescent?: i don't remember being disciplined Does patient have siblings?: Yes Number of Siblings: 5 Description of patient's current relationship with siblings: I talk to my 2 younger siblings and my oldest sibling. The other 2 I don't talk to Did patient suffer any verbal/emotional/physical/sexual abuse as a child?: No Did patient suffer from severe childhood neglect?: No Has patient ever been sexually abused/assaulted/raped as an adolescent or adult?: No Was the patient ever a victim of a crime or a disaster?: No Witnessed domestic violence?: No Has patient been effected by domestic violence as an adult?: No  CCA Part Two B  Employment/Work Situation: Employment / Work Copywriter, advertising Employment situation: On disability Why is patient on disability: MS fibromyalgia, chronic pain How long has patient been on disability: 10 years Patient's job has been impacted by current illness: Yes Describe how patient's job has been impacted: speech, ability to type, pain, walking, tired What is the longest time patient has a held a job?: 4 years Has patient ever been in the [employer]?: No Has patient ever served in combat?: No Did You Receive Any Psychiatric Treatment/Services While in Passenger transport manager?: No Are There Guns or Other Weapons in [place]?: No  Education: Education Last Grade Completed: 12 Did Teacher, adult education From [employer]?: Yes Did Physicist, medical?: No Did Heritage manager?: No  Religion: Religion/Spirituality Are You A Religious Person?: Yes What is Your Religious Affiliation?: Personal assistant: Leisure / Recreation Leisure and Hobbies: crochet, reading, adult coloring books  Exercise/Diet: Exercise/Diet Do You Exercise?: No Have You Gained or Lost A Significant Amount of Weight in the Past Six Months?: No Do You Follow a Special Diet?: No Do You Have Any Trouble Sleeping?: Yes Explanation  of Sleeping Difficulties: can't stay asleep  CCA Part Two C  Alcohol/Drug Use: Alcohol / Drug Use History of alcohol / drug use?: No history of alcohol / drug abuse                      CCA Part Three  ASAM's:  Six Dimensions of Multidimensional Assessment  Dimension 1:  Acute Intoxication and/or Withdrawal Potential:     Dimension 2:  Biomedical Conditions and Complications:     Dimension 3:  Emotional, Behavioral, or Cognitive Conditions and Complications:     Dimension 4:  Readiness to Change:     Dimension 5:  Relapse, Continued use, or Continued Problem Potential:     Dimension 6:  Recovery/Living Environment:      Substance use Disorder (SUD)    Social Function:  Social Functioning Social Maturity: Responsible Social Judgement: Normal  Stress:  Stress Stressors: Illness Coping Ability:  (doesn't feel well enough to cope with illnesses) Patient Takes Medications The Way The Doctor Instructed?: Yes Priority Risk: Low Acuity  Risk Assessment- Self-Harm Potential: Risk Assessment For Self-Harm Potential Thoughts of Self-Harm: No current thoughts Method: No plan Availability of Means: No access/NA  Risk Assessment -Dangerous to Others Potential: Risk Assessment For Dangerous to Others Potential Method: No Plan Availability of Means: No access or NA Intent: Vague intent or NA  DSM5 Diagnoses: Patient Active Problem List   Diagnosis Date Noted  . Multiple joint pain 03/31/2016  . Essential tremor 03/31/2016  .  Numbness in both hands 03/18/2016  . Hypersomnia 11/09/2015  . Diabetes mellitus ([place]) 07/18/2015  . Essential hypertension 07/18/2015  . Abnormal ECG 07/18/2015  . Gait difficulty 02/09/2015  . Fibromyalgia 02/09/2015  . Depression with anxiety 02/09/2015  . Numbness 02/09/2015  . Insomnia 02/09/2015  . Urinary hesitancy 02/09/2015  . Other fatigue 02/09/2015    Patient Centered Plan: Patient is on the following Treatment Plan(s):   Anxiety, depression, chronic pain  Recommendations for Services/Supports/Treatments: Recommendations for Services/Supports/Treatments Recommendations For Services/Supports/Treatments: Individual Therapy  Treatment Plan Summary: To be completed by Individual therapist    Referrals to Alternative Service(s): Referred to Alternative Service(s):   Place:   Date:   Time:    Referred to Alternative Service(s):   Place:   Date:   Time:    Referred to Alternative Service(s):   Place:   Date:   Time:    Referred to Alternative Service(s):   Place:   Date:   Time:     Jenkins Rouge

## 2016-07-22 NOTE — Progress Notes (Signed)
Patient ID: Courtney [name], female   DOB: 1955/10/16, 61 y.o.   MRN: [id number]     Cardiology Office Note   Date:  07/23/2016   ID:  Courtney [name], DOB 1955-10-29, MRN [id number]  PCP:  Lucianne Lei, MD    No chief complaint on file. HTN   Wt Readings from Last 3 Encounters:  07/23/16 173 lb 12.8 oz (78.8 kg)  06/13/16 172 lb 8 oz (78.2 kg)  06/06/16 172 lb (78 kg)       History of Present Illness: Courtney [name] is a 61 y.o. female who has had a h/o recurrent chest pain.  SHe has been evaluated with cardiac cath in 2001 and 2006.  SHe has had multiple stress tests most recently in 2016 which was normal.    She moved to [place] in 10/16 to find warmer weather.  She has been diagnosed with MS and wants to be in a warmer climate.  She had some DOE a few weeks ago but this has improved.  She feels a heaviness in her legs when she walks.  No CP with walking.    She will occasionally have a skipped heart beat.  It used to be frequent but now it is rare.  Better with exercise.    Denies : Chest pain. Dizziness. Leg edema. Nitroglycerin use. Orthopnea.  Paroxysmal nocturnal dyspnea. Shortness of breath. Syncope.   Walking is most frequent exercise.  She has some muscle spasms.  Limited by hip pain mostly.   Decreased BP meds due to lower readings.  If she does not take the med, she as readings in the 130s.      Past Medical History:  Diagnosis Date  . Abnormal ECG 07/18/2015  . Asthma   . Depression with anxiety 02/09/2015  . Diabetes mellitus ([facility]) 07/18/2015  . Diabetes mellitus without complication ([place])   . Diverticulitis   . Essential tremor 03/31/2016  . Fibromyalgia   . Gait difficulty 02/09/2015  . GERD (gastroesophageal reflux disease)   . Hypersomnia 11/09/2015  . Hypertension   . Insomnia 02/09/2015  . Migraines   . MS (multiple sclerosis) ([place])   . Multiple joint pain 03/31/2016  . Numbness in both hands 03/18/2016  . Other fatigue 02/09/2015  . Ulcer    . Urinary hesitancy 02/09/2015  . Vision abnormalities     Past Surgical History:  Procedure Laterality Date  . ABDOMINAL HYSTERECTOMY  1976   including oophorectomy, due to uterine cancer  . APPENDECTOMY  1974  . CHOLECYSTECTOMY     2006  . COLON SURGERY    . [facility]    . KNEE SURGERY       Current Outpatient Prescriptions  Medication Sig Dispense Refill  . albuterol (PROVENTIL HFA;VENTOLIN HFA) 108 (90 Base) MCG/ACT inhaler Inhale 2 puffs into the lungs every 6 (six) hours as needed for wheezing or shortness of breath. 3 Inhaler 3  . alprazolam (XANAX) 2 MG tablet Take 2 mg by mouth 3 (three) times daily as needed for anxiety.     Marland Kitchen amitriptyline (ELAVIL) 25 MG tablet Take 1 tablet (25 mg total) by mouth at bedtime. 90 tablet 3  . baclofen (LIORESAL) 10 MG tablet Take 1 to 3 pills at night 90 each 5  . Biotin 1000 MCG tablet Take 1,000 mcg by mouth daily.    . insulin aspart protamine- aspart (NOVOLOG MIX 70/30) (70-30) 100 UNIT/ML injection Inject 0.26-0.3 mLs (26-30 Units total) into the skin  2 (two) times daily with a meal. 20 units in am, 26 units in pm 10 mL 0  . lamoTRIgine (LAMICTAL) 100 MG tablet Take 1 tablet (100 mg total) by mouth 2 (two) times daily. 180 tablet 3  . lidocaine (XYLOCAINE) 5 % ointment Apply 1 application topically daily as needed for mild pain.    . naloxone (NARCAN) nasal spray 4 mg/0.1 mL Place 1 spray into the nose daily as needed (overdose).    Marland Kitchen oxyCODONE (ROXICODONE) 15 MG immediate release tablet Take 1 tablet by mouth 5 times daily as needed for pain  0  . OXYCONTIN 30 MG 12 hr tablet Take 1 tablet(s) by mouth EVERY TWELVE HOURS]  0  . Telmisartan-Amlodipine 40-5 MG TABS Take 0.5 tablets by mouth every other day.      No current facility-administered medications for this visit.     Allergies:   Ibuprofen; Prednisone; Solu-medrol [methylprednisolone]; Bactrim [sulfamethoxazole-trimethoprim]; Biaxin [clarithromycin]; and Naproxen    Social  History:  The patient  reports that she quit smoking about 28 years ago. Her smoking use included Cigarettes. She has a 1.25 pack-year smoking history. She has never used smokeless tobacco. She reports that she drinks alcohol. She reports that she does not use drugs.   Family History:  [name] family history includes Alcohol abuse in her brother; Diabetes in her brother, father, mother, and sister; Heart disease in her mother; Kidney disease in her mother; Multiple sclerosis in her daughter and other; Other in her father.    ROS:  Please see the history of present illness.   Otherwise, review of systems are positive for hip pain.   All other systems are reviewed and negative.    PHYSICAL EXAM: VS:  BP 92/70 (BP Location: Left Arm, Patient Position: Sitting, Cuff Size: Normal)   Pulse 85   Ht 5\' 3"  (1.6 m)   Wt 173 lb 12.8 oz (78.8 kg)   SpO2 93%   BMI 30.79 kg/m  , BMI Body mass index is 30.79 kg/m. GEN: Well nourished, well developed, in no acute distress  HEENT: normal  Neck: no JVD, carotid bruits, or masses Cardiac: RRR; no murmurs, rubs, or gallops,no edema  Respiratory:  clear to auscultation bilaterally, normal work of breathing GI: soft, nontender, nondistended,  MS: no deformity or atrophy  Skin: warm and dry, scar on top of right foot Neuro:  Strength and sensation are intact Psych: euthymic mood, full affect   EKG:   The ekg ordered today demonstrates NSR, LVH with associated ST changes including inferior and lateral T wave inversion.   Recent Labs: 01/08/2016: ALT 30; BUN 14; Creatinine, Ser 0.96; Potassium 3.8; Sodium 139; TSH 1.55   Lipid Panel No results found for: CHOL, TRIG, HDL, CHOLHDL, VLDL, LDLCALC, LDLDIRECT   Other studies Reviewed: Additional studies/ records that were reviewed today with results demonstrating: prior ECG from [place] shows changes were present in the past.   ASSESSMENT AND PLAN:  1. Chest pain: Sx much less frequent.   Negative stress test in the past.  No current CP.  COntinue regular exercise.  Known abnormal ECG with T wave changes noted.  2. DM: Continue insulin.  Sugars increase with steroids.   3. Multiple sclerosis: Sx managed- intermittent steroid use; hip pain limits exercise. Followed by neurology. She will see her neurologist in [place] as well.  4. Blood work followed by Dr. Criss Rosales.  5. HTN:  BP has been low.  Stop current BP meds and check  blood pressure at home.  Would restart lower dose of BP med if readings consistently > 140/90.    Current medicines are reviewed at length with the patient today.  The patient concerns regarding her medicines were addressed.  The following changes have been made:  No change  Labs/ tests ordered today include:  No orders of the defined types were placed in this encounter.   Recommend 150 minutes/week of aerobic exercise Low fat, low carb, high fiber diet recommended  Disposition:   FU in 1 year   Signed, Larae Grooms, MD  07/23/2016 12:06 PM    [facility] [place], [place], [place]  [address] Phone: [phone]; Fax: [phone]

## 2016-07-23 ENCOUNTER — Encounter: Payer: Self-pay | Admitting: Interventional Cardiology

## 2016-07-23 ENCOUNTER — Ambulatory Visit (INDEPENDENT_AMBULATORY_CARE_PROVIDER_SITE_OTHER): Payer: Medicare Other | Admitting: Interventional Cardiology

## 2016-07-23 VITALS — BP 92/70 | HR 85 | Ht 63.0 in | Wt 173.8 lb

## 2016-07-23 DIAGNOSIS — E118 Type 2 diabetes mellitus with unspecified complications: Secondary | ICD-10-CM

## 2016-07-23 DIAGNOSIS — R9431 Abnormal electrocardiogram [ECG] [EKG]: Secondary | ICD-10-CM | POA: Diagnosis not present

## 2016-07-23 DIAGNOSIS — I10 Essential (primary) hypertension: Secondary | ICD-10-CM

## 2016-07-23 NOTE — Patient Instructions (Signed)
Medication Instructions:  Your physician has recommended you make the following changes to your medication:  1. Stop the Telmisartan/Amlodipine  Labwork: None ordered.   Testing/Procedures: None ordered.   Follow-Up: Your physician wants you to follow-up in: 1 year with Dr. Irish Lack. You will receive a reminder letter in the mail two months in advance. If you don't receive a letter, please call our office to schedule the follow-up appointment.    Any Other Special Instructions Will Be Listed Below (If Applicable).     If you need a refill on your cardiac medications before your next appointment, please call your pharmacy.

## 2016-07-24 ENCOUNTER — Ambulatory Visit (INDEPENDENT_AMBULATORY_CARE_PROVIDER_SITE_OTHER): Payer: Medicare Other | Admitting: Endocrinology

## 2016-07-24 ENCOUNTER — Encounter: Payer: Self-pay | Admitting: Endocrinology

## 2016-07-24 VITALS — BP 124/82 | HR 74 | Ht 63.0 in | Wt 175.0 lb

## 2016-07-24 DIAGNOSIS — Z794 Long term (current) use of insulin: Secondary | ICD-10-CM | POA: Diagnosis not present

## 2016-07-24 DIAGNOSIS — E118 Type 2 diabetes mellitus with unspecified complications: Secondary | ICD-10-CM

## 2016-07-24 LAB — POCT GLYCOSYLATED HEMOGLOBIN (HGB A1C): Hemoglobin A1C: 8.1

## 2016-07-24 MED ORDER — INSULIN ASPART PROT & ASPART (70-30 MIX) 100 UNIT/ML ~~LOC~~ SUSP: SUBCUTANEOUS | 11 refills | Status: DC
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
## 2016-07-24 NOTE — Patient Instructions (Signed)
good diet and exercise significantly improve the control of your diabetes.  please let me know if you wish to be referred to a dietician.  high blood sugar is very risky to your health.  you should see an eye doctor and dentist every year.  It is very important to get all recommended vaccinations.  Controlling your blood pressure and cholesterol drastically reduces the damage diabetes does to your body.  Those who smoke should quit.  Please discuss these with your doctor.  check your blood sugar twice a day.  vary the time of day when you check, between before the 3 meals, and at bedtime.  also check if you have symptoms of your blood sugar being too high or too low.  please keep a record of the readings and bring it to your next appointment here (or you can bring the meter itself).  You can write it on any piece of paper.  please call us sooner if your blood sugar goes below 70, or if you have a lot of readings over 200. Please change the insulin to 35 units with breakfast, and 25 units with supper. A neurologist could help you with the pain and numbness of the thighs better than I could. Please come back for a follow-up appointment in 2 months.

## 2016-07-29 DIAGNOSIS — R635 Abnormal weight gain: Secondary | ICD-10-CM | POA: Diagnosis not present

## 2016-07-29 DIAGNOSIS — M797 Fibromyalgia: Secondary | ICD-10-CM | POA: Diagnosis not present

## 2016-07-29 DIAGNOSIS — F064 Anxiety disorder due to known physiological condition: Secondary | ICD-10-CM | POA: Diagnosis not present

## 2016-07-29 DIAGNOSIS — I10 Essential (primary) hypertension: Secondary | ICD-10-CM | POA: Diagnosis not present

## 2016-08-04 ENCOUNTER — Telehealth: Payer: Self-pay | Admitting: Endocrinology

## 2016-08-04 DIAGNOSIS — G894 Chronic pain syndrome: Secondary | ICD-10-CM | POA: Diagnosis not present

## 2016-08-04 DIAGNOSIS — M545 Low back pain: Secondary | ICD-10-CM | POA: Diagnosis not present

## 2016-08-04 DIAGNOSIS — M25519 Pain in unspecified shoulder: Secondary | ICD-10-CM | POA: Diagnosis not present

## 2016-08-04 DIAGNOSIS — M79606 Pain in leg, unspecified: Secondary | ICD-10-CM | POA: Diagnosis not present

## 2016-08-04 DIAGNOSIS — E1140 Type 2 diabetes mellitus with diabetic neuropathy, unspecified: Secondary | ICD-10-CM | POA: Diagnosis not present

## 2016-08-04 DIAGNOSIS — M542 Cervicalgia: Secondary | ICD-10-CM | POA: Diagnosis not present

## 2016-08-04 DIAGNOSIS — M797 Fibromyalgia: Secondary | ICD-10-CM | POA: Diagnosis not present

## 2016-08-04 NOTE — Telephone Encounter (Signed)
Patient called to advise that she needs her insulin aspart protamine- aspart (NOVOLOG MIX 70/30) (70-30) 100 UNIT/ML injection [[id number]]   [facility]. States that she can not pick it up at [name] because it is a controlled substance.

## 2016-08-05 ENCOUNTER — Ambulatory Visit: Payer: Medicare Other | Admitting: Dietician

## 2016-08-05 ENCOUNTER — Other Ambulatory Visit: Payer: Self-pay

## 2016-08-05 DIAGNOSIS — Z794 Long term (current) use of insulin: Principal | ICD-10-CM

## 2016-08-05 DIAGNOSIS — E118 Type 2 diabetes mellitus with unspecified complications: Secondary | ICD-10-CM

## 2016-08-05 MED ORDER — INSULIN ASPART PROT & ASPART (70-30 MIX) 100 UNIT/ML ~~LOC~~ SUSP: SUBCUTANEOUS | 1 refills | Status: DC

## 2016-08-05 NOTE — Telephone Encounter (Signed)
Patient called stating she cannot get her prescription yet [facility], is leaving to go out of town this evening. Asked for: insulin aspart protamine- aspart (NOVOLOG MIX 70/30) (70-30) 100 UNIT/ML injection [[id number]]   To be sent to:   [place], [place] [phone] (Phone) [phone] (Fax)

## 2016-08-05 NOTE — Telephone Encounter (Signed)
Submitted to pharmacy 

## 2016-08-06 NOTE — Telephone Encounter (Signed)
Patient called back to check on on RX. [facility] had not received the script. I called them and gave the pharmacist the information prescribed verbally. Advised patient that they do have the script now.   Additionally, there was a breakdown in communication when we spoke Monday. The patient DOES need the medication managed through mail order, she just needed one month sent to the local pharmacy. Moving forward, the scripts would go to mail order.,

## 2016-08-06 NOTE — Telephone Encounter (Signed)
Noted! Thank you

## 2016-08-07 DIAGNOSIS — G35 Multiple sclerosis: Secondary | ICD-10-CM | POA: Diagnosis not present

## 2016-08-07 DIAGNOSIS — I10 Essential (primary) hypertension: Secondary | ICD-10-CM | POA: Diagnosis not present

## 2016-08-07 DIAGNOSIS — M797 Fibromyalgia: Secondary | ICD-10-CM | POA: Diagnosis not present

## 2016-08-07 DIAGNOSIS — E118 Type 2 diabetes mellitus with unspecified complications: Secondary | ICD-10-CM | POA: Diagnosis not present

## 2016-08-12 DIAGNOSIS — G35 Multiple sclerosis: Secondary | ICD-10-CM | POA: Diagnosis not present

## 2016-08-20 DIAGNOSIS — G35 Multiple sclerosis: Secondary | ICD-10-CM | POA: Diagnosis not present

## 2016-09-04 ENCOUNTER — Ambulatory Visit (HOSPITAL_COMMUNITY): Payer: Self-pay | Admitting: Licensed Clinical Social Worker

## 2016-09-08 ENCOUNTER — Ambulatory Visit (HOSPITAL_COMMUNITY): Payer: Medicare Other | Admitting: Psychiatry

## 2016-09-08 ENCOUNTER — Telehealth (INDEPENDENT_AMBULATORY_CARE_PROVIDER_SITE_OTHER): Payer: Self-pay | Admitting: Physical Medicine and Rehabilitation

## 2016-09-08 ENCOUNTER — Ambulatory Visit: Payer: Self-pay | Admitting: Interventional Cardiology

## 2016-09-08 DIAGNOSIS — M25519 Pain in unspecified shoulder: Secondary | ICD-10-CM | POA: Diagnosis not present

## 2016-09-08 DIAGNOSIS — M797 Fibromyalgia: Secondary | ICD-10-CM | POA: Diagnosis not present

## 2016-09-08 DIAGNOSIS — M545 Low back pain: Secondary | ICD-10-CM | POA: Diagnosis not present

## 2016-09-08 DIAGNOSIS — M79606 Pain in leg, unspecified: Secondary | ICD-10-CM | POA: Diagnosis not present

## 2016-09-08 DIAGNOSIS — E1140 Type 2 diabetes mellitus with diabetic neuropathy, unspecified: Secondary | ICD-10-CM | POA: Diagnosis not present

## 2016-09-08 DIAGNOSIS — G894 Chronic pain syndrome: Secondary | ICD-10-CM | POA: Diagnosis not present

## 2016-09-08 DIAGNOSIS — M542 Cervicalgia: Secondary | ICD-10-CM | POA: Diagnosis not present

## 2016-09-08 DIAGNOSIS — Z79891 Long term (current) use of opiate analgesic: Secondary | ICD-10-CM | POA: Diagnosis not present

## 2016-09-08 NOTE — Telephone Encounter (Signed)
Very complicated patient. If she still sees Dr. Mirna Mires in pain management she needs to follow-up with him for possible cervical injections. If he requests that we do that I would look into it for her but she really needs to follow-up with him.

## 2016-09-09 NOTE — Telephone Encounter (Signed)
Patient said she does still see Dr. Mirna Mires for pain management, but he does not use x-ray so she does not want him to do injections for her neck. She said she did see him yesterday but did not discuss her neck pain with him because of that.

## 2016-09-09 NOTE — Telephone Encounter (Signed)
Called patient to discuss. No answer and no voicemail.

## 2016-09-09 NOTE — Telephone Encounter (Signed)
I need her to get a note from him that it is ok for me to do since we are not pain mgt. Also, will only do if it helped more than 50%

## 2016-09-10 NOTE — Telephone Encounter (Signed)
Patient will get Courtney [name] a note. She said her neck and her lower back are both hurting, and she would rather have injections for lower back first if possible. She said the relief was about 85%. She is aware we will schedule only if we have a note from Dr. Mirna Mires saying it is ok.

## 2016-09-23 ENCOUNTER — Ambulatory Visit: Payer: Self-pay | Admitting: Endocrinology

## 2016-09-25 ENCOUNTER — Other Ambulatory Visit: Payer: Self-pay

## 2016-09-25 DIAGNOSIS — E118 Type 2 diabetes mellitus with unspecified complications: Secondary | ICD-10-CM

## 2016-09-25 DIAGNOSIS — Z794 Long term (current) use of insulin: Principal | ICD-10-CM

## 2016-09-25 MED ORDER — INSULIN ASPART PROT & ASPART (70-30 MIX) 100 UNIT/ML ~~LOC~~ SUSP: SUBCUTANEOUS | 1 refills | Status: DC

## 2016-09-29 ENCOUNTER — Ambulatory Visit (HOSPITAL_COMMUNITY): Payer: Medicare Other | Admitting: Psychiatry

## 2016-09-29 DIAGNOSIS — M542 Cervicalgia: Secondary | ICD-10-CM | POA: Diagnosis not present

## 2016-09-29 DIAGNOSIS — M79606 Pain in leg, unspecified: Secondary | ICD-10-CM | POA: Diagnosis not present

## 2016-09-29 DIAGNOSIS — M25519 Pain in unspecified shoulder: Secondary | ICD-10-CM | POA: Diagnosis not present

## 2016-09-29 DIAGNOSIS — E1140 Type 2 diabetes mellitus with diabetic neuropathy, unspecified: Secondary | ICD-10-CM | POA: Diagnosis not present

## 2016-09-29 DIAGNOSIS — G894 Chronic pain syndrome: Secondary | ICD-10-CM | POA: Diagnosis not present

## 2016-09-29 DIAGNOSIS — Z79891 Long term (current) use of opiate analgesic: Secondary | ICD-10-CM | POA: Diagnosis not present

## 2016-09-29 DIAGNOSIS — M545 Low back pain: Secondary | ICD-10-CM | POA: Diagnosis not present

## 2016-09-29 DIAGNOSIS — M797 Fibromyalgia: Secondary | ICD-10-CM | POA: Diagnosis not present

## 2016-10-07 DIAGNOSIS — Z1231 Encounter for screening mammogram for malignant neoplasm of breast: Secondary | ICD-10-CM | POA: Diagnosis not present

## 2016-10-07 DIAGNOSIS — I10 Essential (primary) hypertension: Secondary | ICD-10-CM | POA: Diagnosis not present

## 2016-10-07 DIAGNOSIS — E118 Type 2 diabetes mellitus with unspecified complications: Secondary | ICD-10-CM | POA: Diagnosis not present

## 2016-10-13 ENCOUNTER — Other Ambulatory Visit (INDEPENDENT_AMBULATORY_CARE_PROVIDER_SITE_OTHER): Payer: Self-pay

## 2016-10-13 ENCOUNTER — Telehealth (INDEPENDENT_AMBULATORY_CARE_PROVIDER_SITE_OTHER): Payer: Self-pay | Admitting: Orthopaedic Surgery

## 2016-10-13 DIAGNOSIS — G8929 Other chronic pain: Secondary | ICD-10-CM

## 2016-10-13 DIAGNOSIS — M25512 Pain in left shoulder: Principal | ICD-10-CM

## 2016-10-13 NOTE — Telephone Encounter (Signed)
MRI left shoulder

## 2016-10-13 NOTE — Telephone Encounter (Signed)
Sent referral 

## 2016-10-13 NOTE — Telephone Encounter (Signed)
Please advise 

## 2016-10-13 NOTE — Telephone Encounter (Signed)
Patient still having pain with her lt shoulder. Patient wants to know if she needs to come back in for an appt, or if you want to order an MRI. Please call to advise.

## 2016-10-17 NOTE — Telephone Encounter (Signed)
Pt called to see if we have received the ok from Dr. Si Gaul office and I advised her that we have not. She is going to call them again and request this be sent to [place]

## 2016-10-21 ENCOUNTER — Ambulatory Visit (INDEPENDENT_AMBULATORY_CARE_PROVIDER_SITE_OTHER): Payer: Medicare Other | Admitting: Orthopedic Surgery

## 2016-10-22 ENCOUNTER — Other Ambulatory Visit: Payer: Self-pay

## 2016-10-22 DIAGNOSIS — Z794 Long term (current) use of insulin: Principal | ICD-10-CM

## 2016-10-22 DIAGNOSIS — E118 Type 2 diabetes mellitus with unspecified complications: Secondary | ICD-10-CM

## 2016-10-22 MED ORDER — INSULIN ASPART PROT & ASPART (70-30 MIX) 100 UNIT/ML ~~LOC~~ SUSP: SUBCUTANEOUS | 1 refills | Status: DC

## 2016-10-23 DIAGNOSIS — E118 Type 2 diabetes mellitus with unspecified complications: Secondary | ICD-10-CM | POA: Diagnosis not present

## 2016-10-23 DIAGNOSIS — M797 Fibromyalgia: Secondary | ICD-10-CM | POA: Diagnosis not present

## 2016-10-23 DIAGNOSIS — G35 Multiple sclerosis: Secondary | ICD-10-CM | POA: Diagnosis not present

## 2016-10-23 DIAGNOSIS — F338 Other recurrent depressive disorders: Secondary | ICD-10-CM | POA: Diagnosis not present

## 2016-10-23 DIAGNOSIS — I10 Essential (primary) hypertension: Secondary | ICD-10-CM | POA: Diagnosis not present

## 2016-10-26 ENCOUNTER — Ambulatory Visit
Admission: RE | Admit: 2016-10-26 | Discharge: 2016-10-26 | Disposition: A | Discharge status: Home / Self Care | Payer: Medicare Other | Source: Ambulatory Visit | Attending: Orthopaedic Surgery | Admitting: Orthopaedic Surgery

## 2016-10-26 DIAGNOSIS — M25511 Pain in right shoulder: Secondary | ICD-10-CM | POA: Diagnosis not present

## 2016-10-26 DIAGNOSIS — G8929 Other chronic pain: Secondary | ICD-10-CM

## 2016-10-26 DIAGNOSIS — M25512 Pain in left shoulder: Principal | ICD-10-CM

## 2016-10-27 DIAGNOSIS — G894 Chronic pain syndrome: Secondary | ICD-10-CM | POA: Diagnosis not present

## 2016-10-27 DIAGNOSIS — G35 Multiple sclerosis: Secondary | ICD-10-CM | POA: Diagnosis not present

## 2016-10-27 DIAGNOSIS — M542 Cervicalgia: Secondary | ICD-10-CM | POA: Diagnosis not present

## 2016-10-27 DIAGNOSIS — Z79891 Long term (current) use of opiate analgesic: Secondary | ICD-10-CM | POA: Diagnosis not present

## 2016-10-27 DIAGNOSIS — M79606 Pain in leg, unspecified: Secondary | ICD-10-CM | POA: Diagnosis not present

## 2016-10-27 DIAGNOSIS — M797 Fibromyalgia: Secondary | ICD-10-CM | POA: Diagnosis not present

## 2016-10-27 DIAGNOSIS — E1140 Type 2 diabetes mellitus with diabetic neuropathy, unspecified: Secondary | ICD-10-CM | POA: Diagnosis not present

## 2016-10-27 DIAGNOSIS — M545 Low back pain: Secondary | ICD-10-CM | POA: Diagnosis not present

## 2016-11-07 DIAGNOSIS — E119 Type 2 diabetes mellitus without complications: Secondary | ICD-10-CM | POA: Diagnosis not present

## 2016-11-12 DIAGNOSIS — G35 Multiple sclerosis: Secondary | ICD-10-CM | POA: Diagnosis not present

## 2016-11-13 DIAGNOSIS — Z6830 Body mass index (BMI) 30.0-30.9, adult: Secondary | ICD-10-CM | POA: Diagnosis not present

## 2016-11-13 DIAGNOSIS — E1165 Type 2 diabetes mellitus with hyperglycemia: Secondary | ICD-10-CM | POA: Diagnosis not present

## 2016-11-13 DIAGNOSIS — Z794 Long term (current) use of insulin: Secondary | ICD-10-CM | POA: Diagnosis not present

## 2016-11-24 ENCOUNTER — Ambulatory Visit (INDEPENDENT_AMBULATORY_CARE_PROVIDER_SITE_OTHER): Payer: Medicare Other | Admitting: Orthopaedic Surgery

## 2016-11-28 DIAGNOSIS — G894 Chronic pain syndrome: Secondary | ICD-10-CM | POA: Diagnosis not present

## 2016-11-28 DIAGNOSIS — Z79891 Long term (current) use of opiate analgesic: Secondary | ICD-10-CM | POA: Diagnosis not present

## 2016-11-28 DIAGNOSIS — E1140 Type 2 diabetes mellitus with diabetic neuropathy, unspecified: Secondary | ICD-10-CM | POA: Diagnosis not present

## 2016-11-28 DIAGNOSIS — M797 Fibromyalgia: Secondary | ICD-10-CM | POA: Diagnosis not present

## 2016-11-28 DIAGNOSIS — M542 Cervicalgia: Secondary | ICD-10-CM | POA: Diagnosis not present

## 2016-11-28 DIAGNOSIS — M545 Low back pain: Secondary | ICD-10-CM | POA: Diagnosis not present

## 2016-11-28 DIAGNOSIS — M25519 Pain in unspecified shoulder: Secondary | ICD-10-CM | POA: Diagnosis not present

## 2016-11-28 DIAGNOSIS — M79606 Pain in leg, unspecified: Secondary | ICD-10-CM | POA: Diagnosis not present

## 2016-12-09 ENCOUNTER — Ambulatory Visit (INDEPENDENT_AMBULATORY_CARE_PROVIDER_SITE_OTHER): Payer: Medicare Other | Admitting: Orthopaedic Surgery

## 2016-12-09 ENCOUNTER — Encounter (INDEPENDENT_AMBULATORY_CARE_PROVIDER_SITE_OTHER): Payer: Self-pay | Admitting: Orthopaedic Surgery

## 2016-12-09 VITALS — BP 141/88 | HR 94 | Resp 14 | Ht 63.0 in | Wt 175.0 lb

## 2016-12-09 DIAGNOSIS — G8929 Other chronic pain: Secondary | ICD-10-CM | POA: Diagnosis not present

## 2016-12-09 DIAGNOSIS — M25512 Pain in left shoulder: Secondary | ICD-10-CM

## 2016-12-09 MED ORDER — LIDOCAINE HCL 1 % IJ SOLN
2.0000 mL | INTRAMUSCULAR | Status: AC | PRN
  Administered 2016-12-09: 2 mL

## 2016-12-09 MED ORDER — METHYLPREDNISOLONE ACETATE 40 MG/ML IJ SUSP
40.0000 mg | INTRAMUSCULAR | Status: AC | PRN
  Administered 2016-12-09: 40 mg via INTRA_ARTICULAR

## 2016-12-09 MED ORDER — BUPIVACAINE HCL 0.5 % IJ SOLN
2.0000 mL | INTRAMUSCULAR | Status: AC | PRN
  Administered 2016-12-09: 2 mL via INTRA_ARTICULAR

## 2016-12-09 NOTE — Progress Notes (Signed)
Office Visit Note   Patient: Courtney [name]           Date of Birth: 1955/11/10           MRN: [id number] Visit Date: 12/09/2016              Requested by: Lucianne Lei, MD [address] [place], [place] [address] PCP: Lucianne Lei, MD   Assessment & Plan: Visit Diagnoses:  1. Chronic left shoulder pain    MRI scan performed August 2016. Patient has been out of town for over a month. She returns for follow-up. Scan demonstrates slight tendinopathy of the anterior aspect of the distal supraspinatus tendon. No evidence of a tear. No atrophy. Biceps long head intact. Minimal degenerative changes at the acromioclavicular joint. Glenohumeral joint was normal including the labrum. Plan:  long iscussion regarding the findings. She does have impingement syndrome I will plan on performing a subacromial cortisone injection as that is helped in the past. She has had a reaction to IV Solu-Medrol but not to  prior Depo-Medrol subacromial injection Follow-Up Instructions: Return if symptoms worsen or fail to improve.   Orders:  No orders of the defined types were placed in this encounter.  No orders of the defined types were placed in this encounter.     Procedures: Large Joint Inj Date/Time: 12/09/2016 2:36 PM Performed by: Garald Balding Authorized by: Garald Balding   Consent Given by:  Patient Timeout: prior to procedure the correct patient, procedure, and site was verified   Indications:  Pain Location:  Shoulder Site:  L subacromial bursa Prep: patient was prepped and draped in usual sterile fashion   Needle Size:  25 G Needle Length:  1.5 inches Approach:  Lateral Ultrasound Guidance: No   Fluoroscopic Guidance: No   Arthrogram: No   Medications:  2 mL lidocaine 1 %; 2 mL bupivacaine 0.5 %; 40 mg methylPREDNISolone acetate 40 MG/ML Aspiration Attempted: No   Patient tolerance:  Patient tolerated the procedure well with no immediate  complications     Clinical Data: No additional findings.   Subjective: Chief Complaint  Patient presents with  . Left Shoulder - Results    Pt is here for [name] is a 61 y o that is here for MRI results  Courtney [name] has had chronic recurrent pain in the left shoulder. Accordingly she's had an MRI scan here for the results. Still having positive impingement and subacromial pain. She does have history of MS and has had some issues with that. In the past she's had a hospitalization for reaction related to IV Solu-Medrol but not to prior Depo-Medrol joint injections  HPI  Review of Systems  Constitutional: Positive for fatigue. Negative for chills and fever.  Eyes: Negative for itching.  Respiratory: Negative for chest tightness and shortness of breath.   Cardiovascular: Negative for chest pain, palpitations and leg swelling.  Gastrointestinal: Negative for blood in stool, constipation and diarrhea.  Endocrine: Negative for polyuria.  Genitourinary: Negative for dysuria.  Musculoskeletal: Positive for joint swelling. Negative for back pain, neck pain and neck stiffness.  Allergic/Immunologic: Positive for immunocompromised state.  Neurological: Positive for numbness. Negative for dizziness.  Hematological: Does not bruise/bleed easily.  Psychiatric/Behavioral: The patient is not nervous/anxious.      Objective: Vital Signs: BP (!) 141/88   Pulse 94   Resp 14   Ht 5\' 3"  (1.6 m)   Wt 175 lb (79.4 kg)   BMI  31.00 kg/m   Physical Exam  Ortho Exam awake alert and oriented 3. Comfortable sitting. Wearing a mask as she does have a sore throat does have positive impingement testing in the left shoulder. No weakness. Good grip and good release. No pain at the acromioclavicular joint. No crepitation. No referred pain from cervical spine .abduction is causes pain at about 80 in the sub-acromio region anteriorly and laterally  Specialty Comments:  No specialty comments  available.  Imaging: No results found.   PMFS History: Patient Active Problem List   Diagnosis Date Noted  . Multiple joint pain 03/31/2016  . Essential tremor 03/31/2016  . Numbness in both hands 03/18/2016  . Hypersomnia 11/09/2015  . Diabetes mellitus ([place]) 07/18/2015  . Essential hypertension 07/18/2015  . Abnormal ECG 07/18/2015  . Gait difficulty 02/09/2015  . Fibromyalgia 02/09/2015  . Depression with anxiety 02/09/2015  . Numbness 02/09/2015  . Insomnia 02/09/2015  . Urinary hesitancy 02/09/2015  . Other fatigue 02/09/2015   Past Medical History:  Diagnosis Date  . Abnormal ECG 07/18/2015  . Asthma   . Depression with anxiety 02/09/2015  . Diabetes mellitus ([place]) 07/18/2015  . Diabetes mellitus without complication ([place])   . Diverticulitis   . Essential tremor 03/31/2016  . Fibromyalgia   . Gait difficulty 02/09/2015  . GERD (gastroesophageal reflux disease)   . Hypersomnia 11/09/2015  . Hypertension   . Insomnia 02/09/2015  . Migraines   . MS (multiple sclerosis) ([place])   . Multiple joint pain 03/31/2016  . Numbness in both hands 03/18/2016  . Other fatigue 02/09/2015  . Ulcer   . Urinary hesitancy 02/09/2015  . Vision abnormalities     Family History  Problem Relation Age of Onset  . Heart disease Mother   . Kidney disease Mother   . Diabetes Mother   . Other Father   . Diabetes Father   . Multiple sclerosis Daughter   . Multiple sclerosis Other   . Diabetes Sister   . Alcohol abuse Brother        ETOH and marijuana  . Diabetes Brother     Past Surgical History:  Procedure Laterality Date  . ABDOMINAL HYSTERECTOMY  1976   including oophorectomy, due to uterine cancer  . APPENDECTOMY  1974  . CHOLECYSTECTOMY     2006  . COLON SURGERY    . CTR    . KNEE SURGERY     Social History   Occupational History  . Not on file.   Social History Main Topics  . Smoking status: Former Smoker    Packs/day: 0.25    Years: 5.00    Types: Cigarettes     Quit date: 44  . Smokeless tobacco: Never Used  . Alcohol use Yes     Comment: socially only  . Drug use: No  . Sexual activity: Not on file

## 2016-12-11 DIAGNOSIS — R070 Pain in throat: Secondary | ICD-10-CM | POA: Diagnosis not present

## 2016-12-11 DIAGNOSIS — J020 Streptococcal pharyngitis: Secondary | ICD-10-CM | POA: Diagnosis not present

## 2016-12-16 ENCOUNTER — Ambulatory Visit: Payer: Medicare Other | Admitting: Neurology

## 2016-12-22 DIAGNOSIS — E1165 Type 2 diabetes mellitus with hyperglycemia: Secondary | ICD-10-CM | POA: Diagnosis not present

## 2016-12-22 DIAGNOSIS — R413 Other amnesia: Secondary | ICD-10-CM | POA: Diagnosis not present

## 2016-12-22 DIAGNOSIS — Z949 Transplanted organ and tissue status, unspecified: Secondary | ICD-10-CM | POA: Diagnosis not present

## 2016-12-22 DIAGNOSIS — G35 Multiple sclerosis: Secondary | ICD-10-CM | POA: Diagnosis not present

## 2016-12-26 ENCOUNTER — Ambulatory Visit
Admission: RE | Admit: 2016-12-26 | Discharge: 2016-12-26 | Disposition: A | Discharge status: Home / Self Care | Payer: Medicare Other | Source: Ambulatory Visit | Attending: Family Medicine | Admitting: Family Medicine

## 2016-12-26 ENCOUNTER — Other Ambulatory Visit: Payer: Self-pay | Admitting: Family Medicine

## 2016-12-26 DIAGNOSIS — E118 Type 2 diabetes mellitus with unspecified complications: Secondary | ICD-10-CM | POA: Diagnosis not present

## 2016-12-26 DIAGNOSIS — J0100 Acute maxillary sinusitis, unspecified: Secondary | ICD-10-CM

## 2016-12-26 DIAGNOSIS — R51 Headache: Secondary | ICD-10-CM | POA: Diagnosis not present

## 2016-12-26 DIAGNOSIS — M797 Fibromyalgia: Secondary | ICD-10-CM | POA: Diagnosis not present

## 2016-12-26 DIAGNOSIS — R635 Abnormal weight gain: Secondary | ICD-10-CM | POA: Diagnosis not present

## 2016-12-31 DIAGNOSIS — M545 Low back pain: Secondary | ICD-10-CM | POA: Diagnosis not present

## 2016-12-31 DIAGNOSIS — E1140 Type 2 diabetes mellitus with diabetic neuropathy, unspecified: Secondary | ICD-10-CM | POA: Diagnosis not present

## 2016-12-31 DIAGNOSIS — M25519 Pain in unspecified shoulder: Secondary | ICD-10-CM | POA: Diagnosis not present

## 2016-12-31 DIAGNOSIS — M542 Cervicalgia: Secondary | ICD-10-CM | POA: Diagnosis not present

## 2016-12-31 DIAGNOSIS — Z79891 Long term (current) use of opiate analgesic: Secondary | ICD-10-CM | POA: Diagnosis not present

## 2016-12-31 DIAGNOSIS — G894 Chronic pain syndrome: Secondary | ICD-10-CM | POA: Diagnosis not present

## 2016-12-31 DIAGNOSIS — M797 Fibromyalgia: Secondary | ICD-10-CM | POA: Diagnosis not present

## 2016-12-31 DIAGNOSIS — M79606 Pain in leg, unspecified: Secondary | ICD-10-CM | POA: Diagnosis not present

## 2017-01-24 ENCOUNTER — Emergency Department (HOSPITAL_COMMUNITY): Payer: Medicare Other

## 2017-01-24 ENCOUNTER — Encounter (HOSPITAL_COMMUNITY): Payer: Self-pay

## 2017-01-24 ENCOUNTER — Emergency Department (HOSPITAL_COMMUNITY)
Admission: EM | Admit: 2017-01-24 | Discharge: 2017-01-24 | Disposition: A | Discharge status: Home / Self Care | Payer: Medicare Other | Attending: Emergency Medicine | Admitting: Emergency Medicine

## 2017-01-24 DIAGNOSIS — E119 Type 2 diabetes mellitus without complications: Secondary | ICD-10-CM | POA: Diagnosis not present

## 2017-01-24 DIAGNOSIS — J45909 Unspecified asthma, uncomplicated: Secondary | ICD-10-CM | POA: Insufficient documentation

## 2017-01-24 DIAGNOSIS — F329 Major depressive disorder, single episode, unspecified: Secondary | ICD-10-CM | POA: Diagnosis not present

## 2017-01-24 DIAGNOSIS — Z79899 Other long term (current) drug therapy: Secondary | ICD-10-CM | POA: Insufficient documentation

## 2017-01-24 DIAGNOSIS — R109 Unspecified abdominal pain: Secondary | ICD-10-CM | POA: Insufficient documentation

## 2017-01-24 DIAGNOSIS — I10 Essential (primary) hypertension: Secondary | ICD-10-CM | POA: Insufficient documentation

## 2017-01-24 DIAGNOSIS — Z9049 Acquired absence of other specified parts of digestive tract: Secondary | ICD-10-CM | POA: Insufficient documentation

## 2017-01-24 DIAGNOSIS — G35 Multiple sclerosis: Secondary | ICD-10-CM | POA: Insufficient documentation

## 2017-01-24 DIAGNOSIS — F419 Anxiety disorder, unspecified: Secondary | ICD-10-CM | POA: Insufficient documentation

## 2017-01-24 DIAGNOSIS — Z87891 Personal history of nicotine dependence: Secondary | ICD-10-CM | POA: Insufficient documentation

## 2017-01-24 DIAGNOSIS — R197 Diarrhea, unspecified: Secondary | ICD-10-CM | POA: Insufficient documentation

## 2017-01-24 DIAGNOSIS — Z794 Long term (current) use of insulin: Secondary | ICD-10-CM | POA: Diagnosis not present

## 2017-01-24 DIAGNOSIS — K5730 Diverticulosis of large intestine without perforation or abscess without bleeding: Secondary | ICD-10-CM | POA: Diagnosis not present

## 2017-01-24 DIAGNOSIS — R112 Nausea with vomiting, unspecified: Secondary | ICD-10-CM | POA: Insufficient documentation

## 2017-01-24 LAB — URINALYSIS, ROUTINE W REFLEX MICROSCOPIC
Bilirubin Urine: NEGATIVE
GLUCOSE, UA: NEGATIVE mg/dL
HGB URINE DIPSTICK: NEGATIVE
KETONES UR: 5 mg/dL — AB
Leukocytes, UA: NEGATIVE
Nitrite: NEGATIVE
PROTEIN: NEGATIVE mg/dL
SPECIFIC GRAVITY, URINE: 1.025 (ref 1.005–1.030)
pH: 6 (ref 5.0–8.0)

## 2017-01-24 LAB — COMPREHENSIVE METABOLIC PANEL
ALBUMIN: 4.3 g/dL (ref 3.5–5.0)
ALK PHOS: 94 U/L (ref 38–126)
ALT: 22 U/L (ref 14–54)
AST: 26 U/L (ref 15–41)
Anion gap: 9 (ref 5–15)
BILIRUBIN TOTAL: 0.6 mg/dL (ref 0.3–1.2)
BUN: 10 mg/dL (ref 6–20)
CALCIUM: 9.7 mg/dL (ref 8.9–10.3)
CO2: 26 mmol/L (ref 22–32)
CREATININE: 1 mg/dL (ref 0.44–1.00)
Chloride: 103 mmol/L (ref 101–111)
GFR calc Af Amer: >60 mL/min (ref >60)
GFR calc non Af Amer: 59 mL/min — ABNORMAL LOW (ref >60)
GLUCOSE: 160 mg/dL — AB (ref 65–99)
Potassium: 3.8 mmol/L (ref 3.5–5.1)
SODIUM: 138 mmol/L (ref 135–145)
Total Protein: 7.6 g/dL (ref 6.5–8.1)

## 2017-01-24 LAB — LIPASE, BLOOD: Lipase: 16 U/L (ref 11–51)

## 2017-01-24 LAB — CBC
HCT: 40.1 % (ref 36.0–46.0)
Hemoglobin: 13.5 g/dL (ref 12.0–15.0)
MCH: 27.6 pg (ref 26.0–34.0)
MCHC: 33.7 g/dL (ref 30.0–36.0)
MCV: 82 fL (ref 78.0–100.0)
PLATELETS: 182 10*3/uL (ref 150–400)
RBC: 4.89 MIL/uL (ref 3.87–5.11)
RDW: 13.2 % (ref 11.5–15.5)
WBC: 5.5 10*3/uL (ref 4.0–10.5)

## 2017-01-24 MED ORDER — DICYCLOMINE HCL 20 MG PO TABS: 20.0000 mg | ORAL_TABLET | Freq: Two times a day (BID) | ORAL | 0 refills | Status: DC

## 2017-01-24 MED ORDER — ONDANSETRON HCL 4 MG/2ML IJ SOLN
4.0000 mg | Freq: Once | INTRAMUSCULAR | Status: AC
  Administered 2017-01-24: 4 mg via INTRAVENOUS
  Filled 2017-01-24: qty 2

## 2017-01-24 MED ORDER — PROMETHAZINE HCL 25 MG PO TABS: 25.0000 mg | ORAL_TABLET | Freq: Four times a day (QID) | ORAL | 0 refills | Status: DC | PRN

## 2017-01-24 MED ORDER — IOPAMIDOL (ISOVUE-300) INJECTION 61%
INTRAVENOUS | Status: AC
  Administered 2017-01-24: 100 mL via INTRAVENOUS
  Filled 2017-01-24: qty 100

## 2017-01-24 MED ORDER — HYDROMORPHONE HCL 1 MG/ML IJ SOLN
1.0000 mg | Freq: Once | INTRAMUSCULAR | Status: AC
  Administered 2017-01-24: 1 mg via INTRAVENOUS
  Filled 2017-01-24: qty 1

## 2017-01-24 MED ORDER — SODIUM CHLORIDE 0.9 % IV BOLUS (SEPSIS)
1000.0000 mL | Freq: Once | INTRAVENOUS | Status: AC
  Administered 2017-01-24: 1000 mL via INTRAVENOUS

## 2017-01-24 MED ORDER — DICYCLOMINE HCL 10 MG/ML IM SOLN
20.0000 mg | Freq: Once | INTRAMUSCULAR | Status: AC
  Administered 2017-01-24: 20 mg via INTRAMUSCULAR
  Filled 2017-01-24: qty 2

## 2017-01-24 MED ORDER — ONDANSETRON HCL 4 MG/2ML IJ SOLN
INTRAMUSCULAR | Status: AC
  Filled 2017-01-24: qty 2

## 2017-01-24 NOTE — Discharge Instructions (Signed)
Your CT scan did not show any acute findings. Drink plenty of fluids. Phenergan as prescribed as needed for nausea and vomiting. Follow up with family doctor this week for recheck. Return if worsening symptoms.

## 2017-01-24 NOTE — ED Provider Notes (Signed)
[facility] EMERGENCY DEPARTMENT Provider Note   CSN: [id number] Arrival date & time: 01/24/17  1323     History   Chief Complaint Chief Complaint  Patient presents with  . Abdominal Pain  . Emesis    HPI Courtney [name] is a 61 y.o. female.  HPI Courtney [name] is a 61 y.o. female with history of depression, anxiety, fibromyalgia, hypertension, diabetes, acid reflux, history of small bowel obstruction with bowel resection, presents to emergency department complaining of nausea, vomiting, abdominal pain, diarrhea.  Patient states her symptoms began 3 days ago.  States it started with nausea and vomiting.  She states that she then developed abdominal cramping that is worse in the upper abdomen.  She states that this morning around 4 AM she started having watery diarrhea.  She reports about 10 episodes of brown watery stool that is malodorous.  She continues to have severe pain which is what made her come to emergency department.  Patient denies any fever or chills.  Denies any upper respiratory symptoms.  She has not taken any medications to help with her symptoms other than Pepto-Bismol.  She states her nausea is improved today.  She has not had any vomiting today.  She was able to eat a toast.  She states she feels weak, tired.  She admits to eating romaine lettuce 2 days prior to getting sick, and states that she did see there was a recall on the let us due to E. coli infection.  Past Medical History:  Diagnosis Date  . Abnormal ECG 07/18/2015  . Asthma   . Depression with anxiety 02/09/2015  . Diabetes mellitus ([place]) 07/18/2015  . Diabetes mellitus without complication ([place])   . Diverticulitis   . Essential tremor 03/31/2016  . Fibromyalgia   . Gait difficulty 02/09/2015  . GERD (gastroesophageal reflux disease)   . Hypersomnia 11/09/2015  . Hypertension   . Insomnia 02/09/2015  . Migraines   . MS (multiple sclerosis) ([place])   . Multiple joint pain  03/31/2016  . Numbness in both hands 03/18/2016  . Other fatigue 02/09/2015  . Ulcer   . Urinary hesitancy 02/09/2015  . Vision abnormalities     Patient Active Problem List   Diagnosis Date Noted  . Multiple joint pain 03/31/2016  . Essential tremor 03/31/2016  . Numbness in both hands 03/18/2016  . Hypersomnia 11/09/2015  . Diabetes mellitus ([place]) 07/18/2015  . Essential hypertension 07/18/2015  . Abnormal ECG 07/18/2015  . Gait difficulty 02/09/2015  . Fibromyalgia 02/09/2015  . Depression with anxiety 02/09/2015  . Numbness 02/09/2015  . Insomnia 02/09/2015  . Urinary hesitancy 02/09/2015  . Other fatigue 02/09/2015    Past Surgical History:  Procedure Laterality Date  . ABDOMINAL HYSTERECTOMY  1976   including oophorectomy, due to uterine cancer  . APPENDECTOMY  1974  . CHOLECYSTECTOMY     2006  . COLON SURGERY    . CTR    . KNEE SURGERY      OB History    No data available       Home Medications    Prior to Admission medications   Medication Sig Start Date End Date Taking? Authorizing Provider  albuterol (PROVENTIL HFA;VENTOLIN HFA) 108 (90 Base) MCG/ACT inhaler Inhale 2 puffs into the lungs every 6 (six) hours as needed for wheezing or shortness of breath. 12/19/15   Whiteheart, Cristal Ford, NP  alprazolam Duanne Moron) 2 MG tablet Take 2 mg by mouth 3 (three)  times daily as needed for anxiety.     [provider]  amitriptyline (ELAVIL) 25 MG tablet Take 1 tablet (25 mg total) by mouth at bedtime. Patient not taking: Reported on 12/09/2016 03/31/16   Britt Bottom, MD  baclofen (LIORESAL) 10 MG tablet Take 1 to 3 pills at night 06/13/16   Sater, Nanine Means, MD  Biotin 1000 MCG tablet Take 1,000 mcg by mouth daily.    [provider]  empagliflozin (JARDIANCE) 10 MG TABS tablet Take by mouth. 11/13/16   [provider]  gabapentin (NEURONTIN) 300 MG capsule Take 300 mg by mouth as needed.    [provider]  insulin aspart  protamine- aspart (NOVOLOG MIX 70/30) (70-30) 100 UNIT/ML injection 35 units with breakfast, and 25 units with supper 10/22/16   Renato Shin, MD  lamoTRIgine (LAMICTAL) 100 MG tablet Take 1 tablet (100 mg total) by mouth 2 (two) times daily. Patient not taking: Reported on 12/09/2016 03/31/16   Sater, Nanine Means, MD  lidocaine (XYLOCAINE) 5 % ointment Apply 1 application topically daily as needed for mild pain.    [provider]  MORPHABOND ER 30 MG T12A TAKE ONE TABLET BY MOUTH EVERY 8 HOURS FOR PAIN 11/28/16   [provider]  naloxone (NARCAN) nasal spray 4 mg/0.1 mL Place 1 spray into the nose daily as needed (overdose).    [provider]  oxyCODONE (ROXICODONE) 15 MG immediate release tablet Take 1 tablet by mouth 5 times daily as needed for pain 03/17/16   [provider]    Family History Family History  Problem Relation Age of Onset  . Heart disease Mother   . Kidney disease Mother   . Diabetes Mother   . Other Father   . Diabetes Father   . Multiple sclerosis Daughter   . Multiple sclerosis Other   . Diabetes Sister   . Alcohol abuse Brother        ETOH and marijuana  . Diabetes Brother     Social History Social History   Tobacco Use  . Smoking status: Former Smoker    Packs/day: 0.25    Years: 5.00    Pack years: 1.25    Types: Cigarettes    Last attempt to quit: 1990    Years since quitting: 28.9  . Smokeless tobacco: Never Used  Substance Use Topics  . Alcohol use: Yes    Comment: socially only  . Drug use: No     Allergies   Ibuprofen; Prednisone; Solu-medrol [methylprednisolone]; Bactrim [sulfamethoxazole-trimethoprim]; Biaxin [clarithromycin]; and Naproxen   Review of Systems Review of Systems  Constitutional: Positive for fatigue. Negative for chills and fever.  Respiratory: Negative for cough, chest tightness and shortness of breath.   Cardiovascular: Negative for chest pain, palpitations and leg swelling.    Gastrointestinal: Positive for abdominal pain, diarrhea, nausea and vomiting.  Genitourinary: Negative for difficulty urinating, dysuria, flank pain, frequency, pelvic pain, vaginal bleeding, vaginal discharge and vaginal pain.  Musculoskeletal: Negative for arthralgias, myalgias, neck pain and neck stiffness.  Skin: Negative for rash.  Neurological: Positive for weakness. Negative for dizziness and headaches.  All other systems reviewed and are negative.    Physical Exam Updated Vital Signs BP (!) 155/87 (BP Location: Left Arm)   Pulse 82   Temp 98.3 F (36.8 C) (Oral)   Resp 17   SpO2 97%   Physical Exam  Constitutional: She appears well-developed and well-nourished. No distress.  HENT:  Head: Normocephalic.  Eyes:  Conjunctivae are normal.  Neck: Neck supple.  Cardiovascular: Normal rate, regular rhythm and normal heart sounds.  Pulmonary/Chest: Effort normal and breath sounds normal. No respiratory distress. She has no wheezes. She has no rales.  Abdominal: Soft. Bowel sounds are normal. She exhibits no distension. There is tenderness. There is no rebound.  Right upper quadrant and epigastric tenderness.  No guarding  Musculoskeletal: She exhibits no edema.  Neurological: She is alert.  Skin: Skin is warm and dry.  Psychiatric: She has a normal mood and affect. Her behavior is normal.  Nursing note and vitals reviewed.    ED Treatments / Results  Labs (all labs ordered are listed, but only abnormal results are displayed) Labs Reviewed  COMPREHENSIVE METABOLIC PANEL - Abnormal; Notable for the following components:      Result Value   Glucose, Bld 160 (*)    GFR calc non Af Amer 59 (*)    All other components within normal limits  GASTROINTESTINAL PANEL BY PCR, STOOL (REPLACES STOOL CULTURE)  C DIFFICILE QUICK SCREEN W PCR REFLEX  LIPASE, BLOOD  CBC  URINALYSIS, ROUTINE W REFLEX MICROSCOPIC    EKG  EKG Interpretation None       Radiology No results  found.  Procedures Procedures (including critical care time)  Medications Ordered in ED Medications  sodium chloride 0.9 % bolus 1,000 mL (not administered)  ondansetron (ZOFRAN) injection 4 mg (not administered)     Initial Impression / Assessment and Plan / ED Course  I have reviewed the triage vital signs and the nursing notes.  Pertinent labs & imaging results that were available during my care of the patient were reviewed by me and considered in my medical decision making (see chart for details).     Patient with nausea, vomiting, diarrhea for 3 days.  Symptoms are worsening.  Will get labs, stool cultures, will administer IV fluids and Zofran for nausea and vomiting.  Will get CT scan given patient has had history of small bowel obstruction in the past and having significant pain.   7:35 PM Patient has not had any bowel movements in the department. She has tried several times.  She has not been in department for 6 hours.  She has also not had any vomiting.  She is tolerating oral fluids.  CT scan does not show any acute findings.  Plan to discharge home, will give prescription for Phenergan, continue regular medications.  Follow-up with family doctor in 2-3 days for recheck.  Return precautions discussed.  The time of discharge vital signs are normal, abdomen is soft, patient nontoxic.  Vitals:   01/24/17 1830 01/24/17 1910 01/24/17 1911 01/24/17 1920  BP: 115/72 120/71  100/75  Pulse: 75  (!) 56 61  Resp:      Temp:      TempSrc:      SpO2: 98%  100% 98%     Final Clinical Impressions(s) / ED Diagnoses   Final diagnoses:  Nausea vomiting and diarrhea    ED Discharge Orders    None      Janee Morn 01/24/17 1937  Mesner, Corene Cornea, MD 01/25/17 [phone]

## 2017-01-24 NOTE — ED Triage Notes (Signed)
Patient complains of abdominal cramping with vomiting and diarrhea since Thursday, states that her abdomen is cramping worse today, alert and oriented, NAD

## 2017-01-24 NOTE — ED Notes (Signed)
Waiting for iv team upon patients request.

## 2017-01-24 NOTE — ED Notes (Signed)
Patient able to ambulate independently  

## 2017-01-24 NOTE — ED Notes (Signed)
IV team at bedside 

## 2017-01-29 DIAGNOSIS — E118 Type 2 diabetes mellitus with unspecified complications: Secondary | ICD-10-CM | POA: Diagnosis not present

## 2017-01-29 DIAGNOSIS — R10816 Epigastric abdominal tenderness: Secondary | ICD-10-CM | POA: Diagnosis not present

## 2017-01-30 DIAGNOSIS — M542 Cervicalgia: Secondary | ICD-10-CM | POA: Diagnosis not present

## 2017-01-30 DIAGNOSIS — E1140 Type 2 diabetes mellitus with diabetic neuropathy, unspecified: Secondary | ICD-10-CM | POA: Diagnosis not present

## 2017-01-30 DIAGNOSIS — M797 Fibromyalgia: Secondary | ICD-10-CM | POA: Diagnosis not present

## 2017-01-30 DIAGNOSIS — M545 Low back pain: Secondary | ICD-10-CM | POA: Diagnosis not present

## 2017-01-30 DIAGNOSIS — G894 Chronic pain syndrome: Secondary | ICD-10-CM | POA: Diagnosis not present

## 2017-01-30 DIAGNOSIS — M25519 Pain in unspecified shoulder: Secondary | ICD-10-CM | POA: Diagnosis not present

## 2017-01-30 DIAGNOSIS — Z79891 Long term (current) use of opiate analgesic: Secondary | ICD-10-CM | POA: Diagnosis not present

## 2017-01-30 DIAGNOSIS — M79606 Pain in leg, unspecified: Secondary | ICD-10-CM | POA: Diagnosis not present

## 2017-02-02 DIAGNOSIS — G894 Chronic pain syndrome: Secondary | ICD-10-CM | POA: Diagnosis not present

## 2017-02-02 DIAGNOSIS — M542 Cervicalgia: Secondary | ICD-10-CM | POA: Diagnosis not present

## 2017-02-12 DIAGNOSIS — Z794 Long term (current) use of insulin: Secondary | ICD-10-CM | POA: Diagnosis not present

## 2017-02-12 DIAGNOSIS — E119 Type 2 diabetes mellitus without complications: Secondary | ICD-10-CM | POA: Diagnosis not present

## 2017-02-16 DIAGNOSIS — G35 Multiple sclerosis: Secondary | ICD-10-CM | POA: Diagnosis not present

## 2017-02-17 DIAGNOSIS — Z8711 Personal history of peptic ulcer disease: Secondary | ICD-10-CM | POA: Diagnosis not present

## 2017-02-17 DIAGNOSIS — J320 Chronic maxillary sinusitis: Secondary | ICD-10-CM | POA: Diagnosis not present

## 2017-02-17 DIAGNOSIS — J3489 Other specified disorders of nose and nasal sinuses: Secondary | ICD-10-CM | POA: Diagnosis not present

## 2017-02-17 DIAGNOSIS — R1013 Epigastric pain: Secondary | ICD-10-CM | POA: Diagnosis not present

## 2017-02-17 DIAGNOSIS — J329 Chronic sinusitis, unspecified: Secondary | ICD-10-CM | POA: Diagnosis not present

## 2017-02-17 DIAGNOSIS — R140 Abdominal distension (gaseous): Secondary | ICD-10-CM | POA: Diagnosis not present

## 2017-02-17 DIAGNOSIS — K838 Other specified diseases of biliary tract: Secondary | ICD-10-CM | POA: Diagnosis not present

## 2017-02-17 DIAGNOSIS — J342 Deviated nasal septum: Secondary | ICD-10-CM | POA: Diagnosis not present

## 2017-02-20 DIAGNOSIS — Q219 Congenital malformation of cardiac septum, unspecified: Secondary | ICD-10-CM | POA: Diagnosis not present

## 2017-02-20 DIAGNOSIS — J342 Deviated nasal septum: Secondary | ICD-10-CM | POA: Diagnosis not present

## 2017-02-23 DIAGNOSIS — K319 Disease of stomach and duodenum, unspecified: Secondary | ICD-10-CM | POA: Diagnosis not present

## 2017-02-23 DIAGNOSIS — R12 Heartburn: Secondary | ICD-10-CM | POA: Diagnosis not present

## 2017-02-23 DIAGNOSIS — I10 Essential (primary) hypertension: Secondary | ICD-10-CM | POA: Diagnosis not present

## 2017-02-23 DIAGNOSIS — E119 Type 2 diabetes mellitus without complications: Secondary | ICD-10-CM | POA: Diagnosis not present

## 2017-02-23 DIAGNOSIS — R197 Diarrhea, unspecified: Secondary | ICD-10-CM | POA: Diagnosis not present

## 2017-02-23 DIAGNOSIS — R1013 Epigastric pain: Secondary | ICD-10-CM | POA: Diagnosis not present

## 2017-02-23 DIAGNOSIS — G35 Multiple sclerosis: Secondary | ICD-10-CM | POA: Diagnosis not present

## 2017-02-23 DIAGNOSIS — I2510 Atherosclerotic heart disease of native coronary artery without angina pectoris: Secondary | ICD-10-CM | POA: Diagnosis not present

## 2017-02-26 DIAGNOSIS — J320 Chronic maxillary sinusitis: Secondary | ICD-10-CM | POA: Diagnosis not present

## 2017-02-26 DIAGNOSIS — J3489 Other specified disorders of nose and nasal sinuses: Secondary | ICD-10-CM | POA: Diagnosis not present

## 2017-03-02 ENCOUNTER — Telehealth: Payer: Self-pay | Admitting: Interventional Cardiology

## 2017-03-02 DIAGNOSIS — R140 Abdominal distension (gaseous): Secondary | ICD-10-CM | POA: Diagnosis not present

## 2017-03-02 DIAGNOSIS — Z8711 Personal history of peptic ulcer disease: Secondary | ICD-10-CM | POA: Diagnosis not present

## 2017-03-02 DIAGNOSIS — R1013 Epigastric pain: Secondary | ICD-10-CM | POA: Diagnosis not present

## 2017-03-02 DIAGNOSIS — K838 Other specified diseases of biliary tract: Secondary | ICD-10-CM | POA: Diagnosis not present

## 2017-03-02 NOTE — Telephone Encounter (Signed)
New message    STAT if HR is under 50 or over 120 (normal HR is 60-100 beats per minute)  1) What is your heart rate? Pt did not know when she came in.  2) Do you have a log of your heart rate readings (document readings)? Pt states that her heart rate has been between 50-60.  3) Do you have any other symptoms? Headache  Pt states she went to the ER last month. She would like a call from nurse to talk about this.

## 2017-03-02 NOTE — Telephone Encounter (Signed)
Patient called and states that she was in the ER about a month ago for N/V and that is when she first found out that her HR was low. She states that it has been running 50-60 bpm. Patient's BP has been running around 110/70. Patient denies having any chest pain, palpitations, lightheadedness, dizziness, syncope, or any other symptoms other than a HA which she feels is related to her sinus problems. Patient is not on any cardiac meds. Patient advised to continue to monitor and let us know if she becomes symptomatic. Patient verbalized understanding and thanked me for the call.

## 2017-03-04 ENCOUNTER — Other Ambulatory Visit (HOSPITAL_COMMUNITY): Payer: Self-pay | Admitting: Family

## 2017-03-04 DIAGNOSIS — K838 Other specified diseases of biliary tract: Secondary | ICD-10-CM

## 2017-03-04 DIAGNOSIS — R1013 Epigastric pain: Secondary | ICD-10-CM

## 2017-03-04 DIAGNOSIS — R140 Abdominal distension (gaseous): Secondary | ICD-10-CM

## 2017-03-05 ENCOUNTER — Ambulatory Visit (INDEPENDENT_AMBULATORY_CARE_PROVIDER_SITE_OTHER): Payer: Medicare Other | Admitting: Orthopaedic Surgery

## 2017-03-05 ENCOUNTER — Encounter (INDEPENDENT_AMBULATORY_CARE_PROVIDER_SITE_OTHER): Payer: Self-pay | Admitting: Orthopaedic Surgery

## 2017-03-05 VITALS — BP 148/89 | HR 68 | Resp 14 | Ht 64.0 in | Wt 165.0 lb

## 2017-03-05 DIAGNOSIS — M25512 Pain in left shoulder: Secondary | ICD-10-CM

## 2017-03-05 DIAGNOSIS — G8929 Other chronic pain: Secondary | ICD-10-CM | POA: Diagnosis not present

## 2017-03-05 DIAGNOSIS — M7542 Impingement syndrome of left shoulder: Secondary | ICD-10-CM

## 2017-03-05 MED ORDER — BUPIVACAINE HCL 0.5 % IJ SOLN
2.0000 mL | INTRAMUSCULAR | Status: AC | PRN
  Administered 2017-03-05: 2 mL via INTRA_ARTICULAR

## 2017-03-05 MED ORDER — METHYLPREDNISOLONE ACETATE 40 MG/ML IJ SUSP
80.0000 mg | INTRAMUSCULAR | Status: AC | PRN
  Administered 2017-03-05: 80 mg

## 2017-03-05 MED ORDER — LIDOCAINE HCL 2 % IJ SOLN
2.0000 mL | INTRAMUSCULAR | Status: AC | PRN
  Administered 2017-03-05: 2 mL

## 2017-03-05 NOTE — Progress Notes (Signed)
Office Visit Note   Patient: Courtney [name]           Date of Birth: November 03, 1955           MRN: [id number] Visit Date: 03/05/2017              Requested by: Lucianne Lei, MD [address] [place], [place] [address] PCP: Lucianne Lei, MD   Assessment & Plan: Visit Diagnoses:  1. Chronic left shoulder pain   2. Impingement syndrome of left shoulder     Plan: Recurrent left shoulder pain. Prior MRI scan demonstrates minimal tendinosis of the distal supraspinatus tendon and slight inflammation of the subacromial and subdeltoid bursa. Prior cortisone injection made a "big difference". We will reinject the subacromial space and send to physical therapy procedure strengthening exercises. Of also discussed possibility of arthroscopic subacromial decompression and distal clavicle resection if she continues to have problems  Follow-Up Instructions: Return if symptoms worsen or fail to improve.   Orders:  Orders Placed This Encounter  Procedures  . Large Joint Inj: L subacromial bursa   No orders of the defined types were placed in this encounter.     Procedures: Large Joint Inj: L subacromial bursa on 03/05/2017 1:40 PM Indications: pain and diagnostic evaluation Details: 25 G 1.5 in needle, anterolateral approach  Arthrogram: No  Medications: 2 mL lidocaine 2 %; 2 mL bupivacaine 0.5 %; 80 mg methylPREDNISolone acetate 40 MG/ML Consent was given by the patient. Immediately prior to procedure a time out was called to verify the correct patient, procedure, equipment, support staff and site/side marked as required. Patient was prepped and draped in the usual sterile fashion.       Clinical Data: No additional findings.   Subjective: Chief Complaint  Patient presents with  . Left Shoulder - Injections, Pain    Courtney [name] is a 62 y o here today for another Left shoulder injection. Last one 12/09/2016.  Last seen several months ago with a subacromial cortisone  injection left shoulder. Did well until just recently. No injury or trauma. No numbness or tingling. Having trouble with raising her left arm over her head. Had a prior MRI scan in August demonstrating minimal tendinosis of the distal supraspinatus tendon associated with slight inflammation of the subacromial and subdeltoid bursa.  HPI  Review of Systems  Constitutional: Negative for chills, fatigue and fever.  Eyes: Negative for itching.  Respiratory: Negative for chest tightness and shortness of breath.   Cardiovascular: Negative for chest pain, palpitations and leg swelling.  Gastrointestinal: Negative for blood in stool, constipation and diarrhea.  Endocrine: Negative for polyuria.  Genitourinary: Negative for dysuria.  Musculoskeletal: Positive for back pain, joint swelling, neck pain and neck stiffness.  Allergic/Immunologic: Negative for immunocompromised state.  Neurological: Positive for headaches. Negative for dizziness and numbness.  Hematological: Does not bruise/bleed easily.  Psychiatric/Behavioral: The patient is not nervous/anxious.      Objective: Vital Signs: BP (!) 148/89   Pulse 68   Resp 14   Ht 5\' 4"  (1.626 m)   Wt 165 lb (74.8 kg)   BMI 28.32 kg/m   Physical Exam  Ortho Exam positive impingement left shoulder with internal and external rotation. Minimally positive empty can test. Able to raise her left arm fully overhead but with a circuitous arc. Skin intact. Neurovascular exam intact. Biceps intact  Specialty Comments:  No specialty comments available.  Imaging: No results found.   PMFS History: Patient Active Problem  List   Diagnosis Date Noted  . Multiple joint pain 03/31/2016  . Essential tremor 03/31/2016  . Numbness in both hands 03/18/2016  . Hypersomnia 11/09/2015  . Diabetes mellitus ([place]) 07/18/2015  . Essential hypertension 07/18/2015  . Abnormal ECG 07/18/2015  . Gait difficulty 02/09/2015  . Fibromyalgia 02/09/2015  . Depression  with anxiety 02/09/2015  . Numbness 02/09/2015  . Insomnia 02/09/2015  . Urinary hesitancy 02/09/2015  . Other fatigue 02/09/2015   Past Medical History:  Diagnosis Date  . Abnormal ECG 07/18/2015  . Asthma   . Depression with anxiety 02/09/2015  . Diabetes mellitus ([place]) 07/18/2015  . Diabetes mellitus without complication ([place])   . Diverticulitis   . Essential tremor 03/31/2016  . Fibromyalgia   . Gait difficulty 02/09/2015  . GERD (gastroesophageal reflux disease)   . Hypersomnia 11/09/2015  . Hypertension   . Insomnia 02/09/2015  . Migraines   . MS (multiple sclerosis) ([place])   . Multiple joint pain 03/31/2016  . Numbness in both hands 03/18/2016  . Other fatigue 02/09/2015  . Ulcer   . Urinary hesitancy 02/09/2015  . Vision abnormalities     Family History  Problem Relation Age of Onset  . Heart disease Mother   . Kidney disease Mother   . Diabetes Mother   . Other Father   . Diabetes Father   . Multiple sclerosis Daughter   . Multiple sclerosis Other   . Diabetes Sister   . Alcohol abuse Brother        ETOH and marijuana  . Diabetes Brother     Past Surgical History:  Procedure Laterality Date  . ABDOMINAL HYSTERECTOMY  1976   including oophorectomy, due to uterine cancer  . APPENDECTOMY  1974  . CHOLECYSTECTOMY     2006  . COLON SURGERY    . CTR    . KNEE SURGERY     Social History   Occupational History  . Not on file  Tobacco Use  . Smoking status: Former Smoker    Packs/day: 0.25    Years: 5.00    Pack years: 1.25    Types: Cigarettes    Last attempt to quit: 1990    Years since quitting: 29.0  . Smokeless tobacco: Never Used  Substance and Sexual Activity  . Alcohol use: Yes    Comment: socially only  . Drug use: No  . Sexual activity: Not on file

## 2017-03-06 ENCOUNTER — Telehealth (INDEPENDENT_AMBULATORY_CARE_PROVIDER_SITE_OTHER): Payer: Self-pay | Admitting: Orthopaedic Surgery

## 2017-03-06 NOTE — Telephone Encounter (Signed)
Please advise 

## 2017-03-06 NOTE — Telephone Encounter (Signed)
called

## 2017-03-06 NOTE — Telephone Encounter (Signed)
Patient is having a lot of pain in her right arm after her injection yesterday.  She is having a difficult time raising her arm and called the doctor on call last night who was unable to help her.  CB#  [phone]

## 2017-03-06 NOTE — Telephone Encounter (Signed)
Pain is common after cortisone injection. Should resolve within 48 hrs. Use ice, over the counter meds

## 2017-03-09 DIAGNOSIS — M542 Cervicalgia: Secondary | ICD-10-CM | POA: Diagnosis not present

## 2017-03-09 DIAGNOSIS — M79606 Pain in leg, unspecified: Secondary | ICD-10-CM | POA: Diagnosis not present

## 2017-03-09 DIAGNOSIS — M797 Fibromyalgia: Secondary | ICD-10-CM | POA: Diagnosis not present

## 2017-03-09 DIAGNOSIS — M25519 Pain in unspecified shoulder: Secondary | ICD-10-CM | POA: Diagnosis not present

## 2017-03-09 DIAGNOSIS — E1140 Type 2 diabetes mellitus with diabetic neuropathy, unspecified: Secondary | ICD-10-CM | POA: Diagnosis not present

## 2017-03-09 DIAGNOSIS — M545 Low back pain: Secondary | ICD-10-CM | POA: Diagnosis not present

## 2017-03-09 DIAGNOSIS — Z79891 Long term (current) use of opiate analgesic: Secondary | ICD-10-CM | POA: Diagnosis not present

## 2017-03-09 DIAGNOSIS — G894 Chronic pain syndrome: Secondary | ICD-10-CM | POA: Diagnosis not present

## 2017-03-13 ENCOUNTER — Ambulatory Visit (HOSPITAL_COMMUNITY): Payer: Self-pay

## 2017-03-13 ENCOUNTER — Ambulatory Visit (HOSPITAL_COMMUNITY): Payer: Medicare Other

## 2017-03-13 ENCOUNTER — Other Ambulatory Visit (HOSPITAL_COMMUNITY): Payer: Self-pay | Admitting: Family

## 2017-03-13 ENCOUNTER — Ambulatory Visit (HOSPITAL_COMMUNITY)
Admission: RE | Admit: 2017-03-13 | Discharge: 2017-03-13 | Disposition: A | Discharge status: Home / Self Care | Payer: Medicare Other | Source: Ambulatory Visit | Attending: Family | Admitting: Family

## 2017-03-13 DIAGNOSIS — R1013 Epigastric pain: Secondary | ICD-10-CM | POA: Diagnosis not present

## 2017-03-13 DIAGNOSIS — K5730 Diverticulosis of large intestine without perforation or abscess without bleeding: Secondary | ICD-10-CM | POA: Diagnosis not present

## 2017-03-13 DIAGNOSIS — R140 Abdominal distension (gaseous): Secondary | ICD-10-CM

## 2017-03-13 DIAGNOSIS — K838 Other specified diseases of biliary tract: Secondary | ICD-10-CM | POA: Diagnosis not present

## 2017-03-13 DIAGNOSIS — R9389 Abnormal findings on diagnostic imaging of other specified body structures: Secondary | ICD-10-CM | POA: Diagnosis not present

## 2017-03-13 MED ORDER — GADOBENATE DIMEGLUMINE 529 MG/ML IV SOLN
19.0000 mL | Freq: Once | INTRAVENOUS | Status: AC
  Administered 2017-03-13: 19 mL via INTRAVENOUS

## 2017-03-17 ENCOUNTER — Encounter: Payer: Self-pay | Admitting: Gastroenterology

## 2017-03-17 ENCOUNTER — Other Ambulatory Visit (HOSPITAL_COMMUNITY): Payer: Self-pay | Admitting: Family

## 2017-03-17 ENCOUNTER — Other Ambulatory Visit: Payer: Self-pay | Admitting: Family

## 2017-03-17 DIAGNOSIS — K3184 Gastroparesis: Secondary | ICD-10-CM

## 2017-03-19 DIAGNOSIS — J329 Chronic sinusitis, unspecified: Secondary | ICD-10-CM | POA: Diagnosis not present

## 2017-03-24 ENCOUNTER — Encounter (HOSPITAL_COMMUNITY)
Admission: RE | Admit: 2017-03-24 | Discharge: 2017-03-24 | Disposition: A | Discharge status: Home / Self Care | Payer: Medicare Other | Source: Ambulatory Visit | Attending: Family | Admitting: Family

## 2017-03-24 DIAGNOSIS — K3184 Gastroparesis: Secondary | ICD-10-CM | POA: Diagnosis not present

## 2017-03-24 DIAGNOSIS — R109 Unspecified abdominal pain: Secondary | ICD-10-CM | POA: Diagnosis not present

## 2017-03-24 MED ORDER — TECHNETIUM TC 99M SULFUR COLLOID
2.0000 | Freq: Once | INTRAVENOUS | Status: AC | PRN
  Administered 2017-03-24: 2 via ORAL

## 2017-03-26 ENCOUNTER — Encounter: Payer: Self-pay | Admitting: Gastroenterology

## 2017-03-26 ENCOUNTER — Ambulatory Visit (INDEPENDENT_AMBULATORY_CARE_PROVIDER_SITE_OTHER): Payer: Medicare Other | Admitting: Gastroenterology

## 2017-03-26 VITALS — BP 136/72 | HR 68 | Ht 62.5 in | Wt 167.2 lb

## 2017-03-26 DIAGNOSIS — R1031 Right lower quadrant pain: Secondary | ICD-10-CM | POA: Diagnosis not present

## 2017-03-26 DIAGNOSIS — R140 Abdominal distension (gaseous): Secondary | ICD-10-CM | POA: Diagnosis not present

## 2017-03-26 DIAGNOSIS — R9389 Abnormal findings on diagnostic imaging of other specified body structures: Secondary | ICD-10-CM

## 2017-03-26 DIAGNOSIS — K838 Other specified diseases of biliary tract: Secondary | ICD-10-CM

## 2017-03-26 DIAGNOSIS — R110 Nausea: Secondary | ICD-10-CM | POA: Diagnosis not present

## 2017-03-26 MED ORDER — METRONIDAZOLE 500 MG PO TABS: 500.0000 mg | ORAL_TABLET | Freq: Three times a day (TID) | ORAL | 0 refills | Status: DC

## 2017-03-26 MED ORDER — CIPROFLOXACIN HCL 500 MG PO TABS: 500.0000 mg | ORAL_TABLET | Freq: Two times a day (BID) | ORAL | 0 refills | Status: DC

## 2017-03-26 MED ORDER — ONDANSETRON 4 MG PO TBDP: 4.0000 mg | ORAL_TABLET | Freq: Four times a day (QID) | ORAL | 1 refills | Status: DC | PRN

## 2017-03-26 NOTE — H&P (View-Only) (Signed)
03/26/2017 Jurney Cassy Sprowl [id number] June 14, 1955   HISTORY OF PRESENT ILLNESS: This is a 62 year old female who is new to our practice.  Her past medical history includes MS, chronic pain, for which she follows with pain clinic, and IDDM.  She is from [place].  She has lived in [place] for almost 2 years.  Up until now she has still been receiving her GI care when she returns to [place] to visit her family.  She is now trying to establish GI care here in [place].  She tells me that starting November 20 she started feeling poorly after eating a caesar salad with Romaine lettuce; had acute episode of nausea, vomiting, diarrhea.  That resolved after a short time, but when she went to visit her family in December she had been experiencing some right sided abdominal pain, ongoing nausea.  She was seen by her GI physician, Dr. Vernice Jefferson, and underwent EGD on February 23, 2017.  At that time the study was completely normal.  Stomach was biopsied and showed gastric antral mucosa with changes suggestive of reactive gastropathy.  No H. pylori organisms were detected.  During her acute illness back in November she went to the emergency department here in [place], where she had a CT scan of the abdomen pelvis with contrast performed.  This showed colonic diverticulosis without radiographic evidence of diverticulitis.  It showed changes status post cholecystectomy with mild diffuse biliary ductal dilatation but no obstructing etiology apparent by CT scan.  Correlation with liver function test and MRI/MRCP was recommended if further evaluation was warranted.  LFTs have been normal along with BMP, lipase, and CBC.  Upon her return home from [place] in January she underwent the MRI abdomen/MRCP that showed mild intrahepatic and extrahepatic biliary dilatation most likely a physiologic response to cholecystectomy given the lack of other cause identified.  She was also noted to have descending  colon diverticulosis and there was a small fluid signal intensity structure just anterior to the ascending colon, potentially a small duplication cyst or a small fluid-filled diverticulum.  Her GI physician in [place] who was adamant that she needed to undergo endoscopic ultrasound for further evaluation of the biliary dilitation.  She tells me that she was also tested for celiac disease and was told that it was positive, but those lab results are not available to [place] at this time we are going to try to obtain them.  Continues to have a lot of RLQ abdominal pain.  Has ongoing nausea and bloating.  Reports 13 pound weight loss in one month.     Past Medical History:  Diagnosis Date  . Abnormal ECG 07/18/2015  . Asthma   . Depression with anxiety 02/09/2015  . Diabetes mellitus ([place]) 07/18/2015  . Diabetes mellitus without complication ([place])   . Diverticulitis   . Essential tremor 03/31/2016  . Fibromyalgia   . Gait difficulty 02/09/2015  . GERD (gastroesophageal reflux disease)   . Hypersomnia 11/09/2015  . Hypertension   . Insomnia 02/09/2015  . Migraines   . MS (multiple sclerosis) ([place])   . Multiple joint pain 03/31/2016  . Numbness in both hands 03/18/2016  . Other fatigue 02/09/2015  . Ulcer   . Urinary hesitancy 02/09/2015  . Vision abnormalities    Past Surgical History:  Procedure Laterality Date  . ABDOMINAL HYSTERECTOMY  1976   including oophorectomy, due to uterine cancer  . APPENDECTOMY  1974  . CHOLECYSTECTOMY  2006  . COLON SURGERY    . CTR    . KNEE SURGERY      reports that she quit smoking about 29 years ago. Her smoking use included cigarettes. She has a 1.25 pack-year smoking history. she has never used smokeless tobacco. She reports that she drinks alcohol. She reports that she does not use drugs. family history includes Alcohol abuse in her brother; Diabetes in her brother, father, mother, and sister; Heart disease in her mother; Kidney disease in her mother;  Multiple sclerosis in her daughter and other; Other in her father. Allergies  Allergen Reactions  . Ibuprofen Other (See Comments)    Developed a ulcer   . Prednisone Anaphylaxis, Shortness Of Breath and Palpitations  . Solu-Medrol [Methylprednisolone] Anaphylaxis, Shortness Of Breath and Palpitations  . Bactrim [Sulfamethoxazole-Trimethoprim] Other (See Comments)    "blotchiness" redness, face swelling  . Biaxin [Clarithromycin] Swelling  . Naproxen Other (See Comments)    Developed a ulcer       Outpatient Encounter Medications as of 03/26/2017  Medication Sig  . albuterol (PROVENTIL HFA;VENTOLIN HFA) 108 (90 Base) MCG/ACT inhaler Inhale 2 puffs into the lungs every 6 (six) hours as needed for wheezing or shortness of breath.  . alprazolam (XANAX) 2 MG tablet Take 2 mg by mouth 3 (three) times daily as needed for anxiety.   . baclofen (LIORESAL) 10 MG tablet Take 1 to 3 pills at night (Patient taking differently: Take 1 to 3 pills at night as needed)  . Biotin 1000 MCG tablet Take 1,000 mcg by mouth daily.  . butalbital-acetaminophen-caffeine (FIORICET, ESGIC) 50-325-40 MG tablet Take by mouth 2 (two) times daily as needed for headache.  . dicyclomine (BENTYL) 20 MG tablet Take 1 tablet (20 mg total) by mouth 2 (two) times daily. (Patient taking differently: Take 20 mg by mouth as needed. )  . gabapentin (NEURONTIN) 300 MG capsule Take 300 mg by mouth as needed.  . insulin aspart protamine- aspart (NOVOLOG MIX 70/30) (70-30) 100 UNIT/ML injection 35 units with breakfast, and 25 units with supper  . lidocaine (XYLOCAINE) 5 % ointment Apply 1 application topically daily as needed for mild pain.  . naloxone (NARCAN) nasal spray 4 mg/0.1 mL Place 1 spray into the nose daily as needed (overdose).  Marland Kitchen oxyCODONE (ROXICODONE) 15 MG immediate release tablet Take 1 tablet by mouth 5 times daily as needed for pain  . OXYCONTIN 40 MG 12 hr tablet TAKE ONE TABLET BY MOUTH EVERY TWELVE HOURS AS NEEDED  FOR PAIN  . promethazine (PHENERGAN) 25 MG tablet Take 1 tablet (25 mg total) by mouth every 6 (six) hours as needed for nausea or vomiting.  . [DISCONTINUED] amitriptyline (ELAVIL) 25 MG tablet Take 1 tablet (25 mg total) by mouth at bedtime. (Patient not taking: Reported on 03/05/2017)  . [DISCONTINUED] clindamycin (CLEOCIN) 300 MG capsule TK ONE C PO  Q 8  H FOR 10 DAYS  . [DISCONTINUED] empagliflozin (JARDIANCE) 10 MG TABS tablet Take by mouth.  . [DISCONTINUED] lamoTRIgine (LAMICTAL) 100 MG tablet Take 1 tablet (100 mg total) by mouth 2 (two) times daily.  . [DISCONTINUED] MORPHABOND ER 30 MG T12A TAKE ONE TABLET BY MOUTH EVERY 8 HOURS FOR PAIN   No facility-administered encounter medications on file as of 03/26/2017.     REVIEW OF SYSTEMS  : All other systems reviewed and negative except where noted in the History of Present Illness.   PHYSICAL EXAM: Ht 5' 2.5" (1.588 m) Comment: height measured without  shoes  Wt 167 lb 4 oz (75.9 kg)   BMI 30.10 kg/m  General: Well developed black female in no acute distress Head: Normocephalic and atraumatic Eyes:  Sclerae anicteric, conjunctiva pink. Ears: Normal auditory acuity Lungs: Clear throughout to auscultation; no increased WOB. Heart: Regular rate and rhythm; no M/R/G. Abdomen: Soft, non-distended.  BS present.  Moderate TTP in RLQ. Musculoskeletal: Symmetrical with no gross deformities  Skin: No lesions on visible extremities Extremities: No edema  Neurological: Alert oriented x 4, grossly non-focal Psychological:  Alert and cooperative. Normal mood and affect  ASSESSMENT AND PLAN: *Mild intrahepatic and extrahepatic biliary dilatation seen on recent imaging:  MRCP negative for cause, saying likely a physiologic response to cholecystectomy given lack of other cause identified.  LFT's are normal.  Her GI in [place] told her that she needed an EUS to evaluate this further.  I do not think that she needs an EUS, but I told her that I would  have Dr. Ardis Hughs review this.  Just of note, she has lost about 13 pounds in one month but has had this other RLQ abdominal pain that has really been bothering her as well. *RLQ abdominal pain:  I am unsure of the cause of her pain, but she is quite tender on exam.  MRCP showed a structure just anterior to the ascending colon, potentially a small duplication cyst or a small fluid-filled diverticulum.  This is in the area of her pain and tenderness.  Question if this could be a right-sided diverticulitis.  I am going to treat her with a course of Cipro 500 mg twice daily and Flagyl 500 mg 3 times daily.  Will give Zofran for nausea. *Abnormal labs:  Says abnormal celiac labs.  We do not have those results.  Will try to obtain.   CC:  Lucianne Lei, MD

## 2017-03-26 NOTE — Progress Notes (Signed)
03/26/2017 Courtney [name] [id number] [birth year]   HISTORY OF PRESENT ILLNESS: This is a 62 year old female who is new to our practice.  Her past medical history includes MS, chronic pain, for which she follows with pain clinic, and IDDM.  She is from [place].  She has lived in [place] for almost 2 years.  Up until now she has still been receiving her GI care when she returns to [place] to visit her family.  She is now trying to establish GI care here in [place].  She tells me that starting November 20 she started feeling poorly after eating a caesar salad with Romaine lettuce; had acute episode of nausea, vomiting, diarrhea.  That resolved after a short time, but when she went to visit her family in December she had been experiencing some right sided abdominal pain, ongoing nausea.  She was seen by her GI physician, Dr. Vernice Jefferson, and underwent EGD on February 23, 2017.  At that time the study was completely normal.  Stomach was biopsied and showed gastric antral mucosa with changes suggestive of reactive gastropathy.  No H. pylori organisms were detected.  During her acute illness back in November she went to the emergency department here in [place], where she had a CT scan of the abdomen pelvis with contrast performed.  This showed colonic diverticulosis without radiographic evidence of diverticulitis.  It showed changes status post cholecystectomy with mild diffuse biliary ductal dilatation but no obstructing etiology apparent by CT scan.  Correlation with liver function test and MRI/MRCP was recommended if further evaluation was warranted.  LFTs have been normal along with BMP, lipase, and CBC.  Upon her return home from [place] in January she underwent the MRI abdomen/MRCP that showed mild intrahepatic and extrahepatic biliary dilatation most likely a physiologic response to cholecystectomy given the lack of other cause identified.  She was also noted to have descending  colon diverticulosis and there was a small fluid signal intensity structure just anterior to the ascending colon, potentially a small duplication cyst or a small fluid-filled diverticulum.  Her GI physician in [place] who was adamant that she needed to undergo endoscopic ultrasound for further evaluation of the biliary dilitation.  She tells me that she was also tested for celiac disease and was told that it was positive, but those lab results are not available to [place] at this time we are going to try to obtain them.  Continues to have a lot of RLQ abdominal pain.  Has ongoing nausea and bloating.  Reports 13 pound weight loss in one month.     Past Medical History:  Diagnosis Date  . Abnormal ECG 07/18/2015  . Asthma   . Depression with anxiety 02/09/2015  . Diabetes mellitus ([place]) 07/18/2015  . Diabetes mellitus without complication ([place])   . Diverticulitis   . Essential tremor 03/31/2016  . Fibromyalgia   . Gait difficulty 02/09/2015  . GERD (gastroesophageal reflux disease)   . Hypersomnia 11/09/2015  . Hypertension   . Insomnia 02/09/2015  . Migraines   . MS (multiple sclerosis) ([place])   . Multiple joint pain 03/31/2016  . Numbness in both hands 03/18/2016  . Other fatigue 02/09/2015  . Ulcer   . Urinary hesitancy 02/09/2015  . Vision abnormalities    Past Surgical History:  Procedure Laterality Date  . ABDOMINAL HYSTERECTOMY  1976   including oophorectomy, due to uterine cancer  . APPENDECTOMY  1974  . CHOLECYSTECTOMY  2006  . COLON SURGERY    . CTR    . KNEE SURGERY      reports that she quit smoking about 29 years ago. Her smoking use included cigarettes. She has a 1.25 pack-year smoking history. she has never used smokeless tobacco. She reports that she drinks alcohol. She reports that she does not use drugs. family history includes Alcohol abuse in her brother; Diabetes in her brother, father, mother, and sister; Heart disease in her mother; Kidney disease in her mother;  Multiple sclerosis in her daughter and other; Other in her father. Allergies  Allergen Reactions  . Ibuprofen Other (See Comments)    Developed a ulcer   . Prednisone Anaphylaxis, Shortness Of Breath and Palpitations  . Solu-Medrol [Methylprednisolone] Anaphylaxis, Shortness Of Breath and Palpitations  . Bactrim [Sulfamethoxazole-Trimethoprim] Other (See Comments)    "blotchiness" redness, face swelling  . Biaxin [Clarithromycin] Swelling  . Naproxen Other (See Comments)    Developed a ulcer       Outpatient Encounter Medications as of 03/26/2017  Medication Sig  . albuterol (PROVENTIL HFA;VENTOLIN HFA) 108 (90 Base) MCG/ACT inhaler Inhale 2 puffs into the lungs every 6 (six) hours as needed for wheezing or shortness of breath.  . alprazolam (XANAX) 2 MG tablet Take 2 mg by mouth 3 (three) times daily as needed for anxiety.   . baclofen (LIORESAL) 10 MG tablet Take 1 to 3 pills at night (Patient taking differently: Take 1 to 3 pills at night as needed)  . Biotin 1000 MCG tablet Take 1,000 mcg by mouth daily.  . butalbital-acetaminophen-caffeine (FIORICET, ESGIC) 50-325-40 MG tablet Take by mouth 2 (two) times daily as needed for headache.  . dicyclomine (BENTYL) 20 MG tablet Take 1 tablet (20 mg total) by mouth 2 (two) times daily. (Patient taking differently: Take 20 mg by mouth as needed. )  . gabapentin (NEURONTIN) 300 MG capsule Take 300 mg by mouth as needed.  . insulin aspart protamine- aspart (NOVOLOG MIX 70/30) (70-30) 100 UNIT/ML injection 35 units with breakfast, and 25 units with supper  . lidocaine (XYLOCAINE) 5 % ointment Apply 1 application topically daily as needed for mild pain.  . naloxone (NARCAN) nasal spray 4 mg/0.1 mL Place 1 spray into the nose daily as needed (overdose).  Marland Kitchen oxyCODONE (ROXICODONE) 15 MG immediate release tablet Take 1 tablet by mouth 5 times daily as needed for pain  . OXYCONTIN 40 MG 12 hr tablet TAKE ONE TABLET BY MOUTH EVERY TWELVE HOURS AS NEEDED  FOR PAIN  . promethazine (PHENERGAN) 25 MG tablet Take 1 tablet (25 mg total) by mouth every 6 (six) hours as needed for nausea or vomiting.  . [DISCONTINUED] amitriptyline (ELAVIL) 25 MG tablet Take 1 tablet (25 mg total) by mouth at bedtime. (Patient not taking: Reported on 03/05/2017)  . [DISCONTINUED] clindamycin (CLEOCIN) 300 MG capsule TK ONE C PO  Q 8  H FOR 10 DAYS  . [DISCONTINUED] empagliflozin (JARDIANCE) 10 MG TABS tablet Take by mouth.  . [DISCONTINUED] lamoTRIgine (LAMICTAL) 100 MG tablet Take 1 tablet (100 mg total) by mouth 2 (two) times daily.  . [DISCONTINUED] MORPHABOND ER 30 MG T12A TAKE ONE TABLET BY MOUTH EVERY 8 HOURS FOR PAIN   No facility-administered encounter medications on file as of 03/26/2017.     REVIEW OF SYSTEMS  : All other systems reviewed and negative except where noted in the History of Present Illness.   PHYSICAL EXAM: Ht 5' 2.5" (1.588 m) Comment: height measured without  shoes  Wt 167 lb 4 oz (75.9 kg)   BMI 30.10 kg/m  General: Well developed black female in no acute distress Head: Normocephalic and atraumatic Eyes:  Sclerae anicteric, conjunctiva pink. Ears: Normal auditory acuity Lungs: Clear throughout to auscultation; no increased WOB. Heart: Regular rate and rhythm; no M/R/G. Abdomen: Soft, non-distended.  BS present.  Moderate TTP in RLQ. Musculoskeletal: Symmetrical with no gross deformities  Skin: No lesions on visible extremities Extremities: No edema  Neurological: Alert oriented x 4, grossly non-focal Psychological:  Alert and cooperative. Normal mood and affect  ASSESSMENT AND PLAN: *Mild intrahepatic and extrahepatic biliary dilatation seen on recent imaging:  MRCP negative for cause, saying likely a physiologic response to cholecystectomy given lack of other cause identified.  LFT's are normal.  Her GI in [place] told her that she needed an EUS to evaluate this further.  I do not think that she needs an EUS, but I told her that I would  have Dr. Ardis Hughs review this.  Just of note, she has lost about 13 pounds in one month but has had this other RLQ abdominal pain that has really been bothering her as well. *RLQ abdominal pain:  I am unsure of the cause of her pain, but she is quite tender on exam.  MRCP showed a structure just anterior to the ascending colon, potentially a small duplication cyst or a small fluid-filled diverticulum.  This is in the area of her pain and tenderness.  Question if this could be a right-sided diverticulitis.  I am going to treat her with a course of Cipro 500 mg twice daily and Flagyl 500 mg 3 times daily.  Will give Zofran for nausea. *Abnormal labs:  Says abnormal celiac labs.  We do not have those results.  Will try to obtain.   CC:  Lucianne Lei, MD

## 2017-03-26 NOTE — Patient Instructions (Addendum)
If you are age 63 or older, your body mass index should be between 23-30. Your Body mass index is 30.1 kg/m. If this is out of the aforementioned range listed, please consider follow up with your Primary Care Provider.  If you are age 76 or younger, your body mass index should be between 19-25. Your Body mass index is 30.1 kg/m. If this is out of the aformentioned range listed, please consider follow up with your Primary Care Provider.   We have sent the following medications to your pharmacy for you to pick up at your convenience: Cipro Flagyl Zofran  Will get lab results.  Thank you for choosing me and [facility].   Alonza Bogus, PA

## 2017-03-27 ENCOUNTER — Encounter: Payer: Self-pay | Admitting: Gastroenterology

## 2017-03-27 DIAGNOSIS — R1031 Right lower quadrant pain: Secondary | ICD-10-CM | POA: Insufficient documentation

## 2017-03-27 DIAGNOSIS — K838 Other specified diseases of biliary tract: Secondary | ICD-10-CM | POA: Insufficient documentation

## 2017-03-27 DIAGNOSIS — R140 Abdominal distension (gaseous): Secondary | ICD-10-CM | POA: Insufficient documentation

## 2017-03-27 DIAGNOSIS — R9389 Abnormal findings on diagnostic imaging of other specified body structures: Secondary | ICD-10-CM | POA: Insufficient documentation

## 2017-03-27 DIAGNOSIS — R110 Nausea: Secondary | ICD-10-CM | POA: Insufficient documentation

## 2017-03-30 ENCOUNTER — Telehealth: Payer: Self-pay | Admitting: Gastroenterology

## 2017-03-30 NOTE — Telephone Encounter (Signed)
Please let the patient know that I did receive her labs.  Her celiac labs were negative as she said.  Her lipase level was low.  It is nothing to be addressed and is not an issue unless it is elevated so nothing to do and not worrisome there.  I am still waiting for Dr. Ardis Hughs to review about any need for endoscopic ultrasound.  In regards to her right-sided pain, I prescribed Cipro and Flagyl.  Issue taking those antibiotics?  Thank you,  Jess

## 2017-03-30 NOTE — Telephone Encounter (Signed)
Courtney [name] the pt sent you lab results in My Chart.  Have you reviewed those?  The pt says she is still in a lot of pain.

## 2017-03-31 NOTE — Progress Notes (Signed)
If continues to have persistent pain, will consider repeat imaging MRCP or EUS.   Reviewed and agree with documentation and assessment and plan. Damaris Hippo , MD

## 2017-03-31 NOTE — Telephone Encounter (Signed)
The pt has been advised and will wait for a call regarding the possible EUS.  She is taking her abx, she has 5 days worth left.

## 2017-04-01 ENCOUNTER — Telehealth: Payer: Self-pay

## 2017-04-01 ENCOUNTER — Emergency Department (HOSPITAL_COMMUNITY)
Admission: EM | Admit: 2017-04-01 | Discharge: 2017-04-02 | Disposition: A | Discharge status: Home / Self Care | Payer: Medicare Other | Attending: Emergency Medicine | Admitting: Emergency Medicine

## 2017-04-01 ENCOUNTER — Encounter (HOSPITAL_COMMUNITY): Payer: Self-pay | Admitting: Emergency Medicine

## 2017-04-01 ENCOUNTER — Other Ambulatory Visit: Payer: Self-pay

## 2017-04-01 DIAGNOSIS — K838 Other specified diseases of biliary tract: Secondary | ICD-10-CM

## 2017-04-01 DIAGNOSIS — Z87891 Personal history of nicotine dependence: Secondary | ICD-10-CM | POA: Diagnosis not present

## 2017-04-01 DIAGNOSIS — R1031 Right lower quadrant pain: Secondary | ICD-10-CM | POA: Insufficient documentation

## 2017-04-01 DIAGNOSIS — E119 Type 2 diabetes mellitus without complications: Secondary | ICD-10-CM | POA: Diagnosis not present

## 2017-04-01 DIAGNOSIS — R110 Nausea: Secondary | ICD-10-CM | POA: Diagnosis not present

## 2017-04-01 DIAGNOSIS — K219 Gastro-esophageal reflux disease without esophagitis: Secondary | ICD-10-CM | POA: Diagnosis not present

## 2017-04-01 DIAGNOSIS — I10 Essential (primary) hypertension: Secondary | ICD-10-CM | POA: Insufficient documentation

## 2017-04-01 DIAGNOSIS — R109 Unspecified abdominal pain: Secondary | ICD-10-CM | POA: Diagnosis present

## 2017-04-01 DIAGNOSIS — E039 Hypothyroidism, unspecified: Secondary | ICD-10-CM | POA: Diagnosis not present

## 2017-04-01 DIAGNOSIS — G8929 Other chronic pain: Secondary | ICD-10-CM | POA: Diagnosis not present

## 2017-04-01 DIAGNOSIS — R634 Abnormal weight loss: Secondary | ICD-10-CM

## 2017-04-01 LAB — CBC
HEMATOCRIT: 41.3 % (ref 36.0–46.0)
HEMOGLOBIN: 14 g/dL (ref 12.0–15.0)
MCH: 28.2 pg (ref 26.0–34.0)
MCHC: 33.9 g/dL (ref 30.0–36.0)
MCV: 83.3 fL (ref 78.0–100.0)
Platelets: 151 10*3/uL (ref 150–400)
RBC: 4.96 MIL/uL (ref 3.87–5.11)
RDW: 13 % (ref 11.5–15.5)
WBC: 7.8 10*3/uL (ref 4.0–10.5)

## 2017-04-01 LAB — COMPREHENSIVE METABOLIC PANEL
ALT: 32 U/L (ref 14–54)
ANION GAP: 11 (ref 5–15)
AST: 37 U/L (ref 15–41)
Albumin: 4.5 g/dL (ref 3.5–5.0)
Alkaline Phosphatase: 100 U/L (ref 38–126)
BUN: 13 mg/dL (ref 6–20)
CHLORIDE: 103 mmol/L (ref 101–111)
CO2: 26 mmol/L (ref 22–32)
Calcium: 9.4 mg/dL (ref 8.9–10.3)
Creatinine, Ser: 1.02 mg/dL — ABNORMAL HIGH (ref 0.44–1.00)
GFR, EST NON AFRICAN AMERICAN: 58 mL/min — AB (ref >60)
Glucose, Bld: 206 mg/dL — ABNORMAL HIGH (ref 65–99)
POTASSIUM: 4 mmol/L (ref 3.5–5.1)
Sodium: 140 mmol/L (ref 135–145)
Total Bilirubin: 0.1 mg/dL — ABNORMAL LOW (ref 0.3–1.2)
Total Protein: 8.2 g/dL — ABNORMAL HIGH (ref 6.5–8.1)

## 2017-04-01 LAB — LIPASE, BLOOD: LIPASE: 20 U/L (ref 11–51)

## 2017-04-01 NOTE — Telephone Encounter (Signed)
Zehr, Laban Emperor, PA-C  Jeoffrey Massed, RN        Britany Callicott,   Please let the patient know that after speaking with Dr. Ardis Hughs about this he thinks that it is unlikely that the dilation of her ducts is a problem, but due to her weight loss that he thinks that we should just go ahead and perform EUS to be safe.   Thank you,   Jess

## 2017-04-01 NOTE — ED Triage Notes (Signed)
Patient c/o RLQ pain x1 month. Reports following up with [facility]. States she has a [place] endoscopy scheduled for next month. Denies N/V/D.

## 2017-04-01 NOTE — Telephone Encounter (Signed)
-----   Message from Milus Banister, MD sent at 04/01/2017  8:22 AM EST -----   ----- Message ----- From: Loralie Champagne, PA-C Sent: 03/27/2017   5:16 PM To: Milus Banister, MD

## 2017-04-01 NOTE — Progress Notes (Signed)
I think it is unlikely that the mild bil dilation is pathologic.  Given her weight loss I think to be safe EUS is a good next step.  I would plan to evaluate the ampulla with side viewing scope at the same time.    Jess, Can you let her know about the plan  Patty, She needs upper EUS, radial +/- linear, next available EUS Thursday with MAC sedation for biliary dilation, weight loss.  Thanks

## 2017-04-01 NOTE — Telephone Encounter (Signed)
Courtney [name], She needs upper EUS, radial +/- linear, next available EUS Thursday with MAC sedation for biliary dilation, weight loss.  EUS scheduled, pt instructed and medications reviewed.  Patient instructions sent via My Chart.  Patient to call with any questions or concerns.

## 2017-04-01 NOTE — ED Notes (Signed)
Bed: WA09 Expected date:  Expected time:  Means of arrival:  Comments: Triage Nanetta Batty

## 2017-04-02 ENCOUNTER — Encounter (HOSPITAL_COMMUNITY): Payer: Self-pay

## 2017-04-02 ENCOUNTER — Emergency Department (HOSPITAL_COMMUNITY): Payer: Medicare Other

## 2017-04-02 ENCOUNTER — Other Ambulatory Visit: Payer: Self-pay

## 2017-04-02 DIAGNOSIS — R1031 Right lower quadrant pain: Secondary | ICD-10-CM | POA: Diagnosis not present

## 2017-04-02 DIAGNOSIS — K219 Gastro-esophageal reflux disease without esophagitis: Secondary | ICD-10-CM | POA: Diagnosis not present

## 2017-04-02 LAB — URINALYSIS, ROUTINE W REFLEX MICROSCOPIC
Bilirubin Urine: NEGATIVE
Glucose, UA: >=500 mg/dL — AB
Hgb urine dipstick: NEGATIVE
KETONES UR: 5 mg/dL — AB
Leukocytes, UA: NEGATIVE
Nitrite: NEGATIVE
PH: 6 (ref 5.0–8.0)
Protein, ur: NEGATIVE mg/dL
RBC / HPF: NONE SEEN RBC/hpf (ref 0–5)
SQUAMOUS EPITHELIAL / LPF: NONE SEEN
Specific Gravity, Urine: 1.008 (ref 1.005–1.030)

## 2017-04-02 MED ORDER — ONDANSETRON HCL 4 MG/2ML IJ SOLN
4.0000 mg | Freq: Once | INTRAMUSCULAR | Status: AC
  Administered 2017-04-02: 4 mg via INTRAVENOUS
  Filled 2017-04-02: qty 2

## 2017-04-02 MED ORDER — METHOCARBAMOL 500 MG PO TABS: ORAL_TABLET | ORAL | 0 refills | Status: DC

## 2017-04-02 MED ORDER — SODIUM CHLORIDE 0.9 % IV BOLUS (SEPSIS)
1000.0000 mL | Freq: Once | INTRAVENOUS | Status: AC
  Administered 2017-04-02: 1000 mL via INTRAVENOUS

## 2017-04-02 MED ORDER — SODIUM CHLORIDE 0.9 % IV BOLUS (SEPSIS)
500.0000 mL | Freq: Once | INTRAVENOUS | Status: AC
  Administered 2017-04-02: 500 mL via INTRAVENOUS

## 2017-04-02 MED ORDER — HYDROMORPHONE HCL 1 MG/ML IJ SOLN
1.0000 mg | Freq: Once | INTRAMUSCULAR | Status: AC
  Administered 2017-04-02: 1 mg via INTRAVENOUS
  Filled 2017-04-02: qty 1

## 2017-04-02 MED ORDER — IOPAMIDOL (ISOVUE-300) INJECTION 61%
100.0000 mL | Freq: Once | INTRAVENOUS | Status: AC | PRN
  Administered 2017-04-02: 100 mL via INTRAVENOUS

## 2017-04-02 MED ORDER — IOPAMIDOL (ISOVUE-300) INJECTION 61%
INTRAVENOUS | Status: AC
  Administered 2017-04-02: 100 mL via INTRAVENOUS
  Filled 2017-04-02: qty 100

## 2017-04-02 NOTE — ED Notes (Signed)
Patient transported to CT 

## 2017-04-02 NOTE — ED Provider Notes (Signed)
[facility] DEPT Provider Note   CSN: [id number] Arrival date & time: 04/01/17  1525  Time seen 23:48 PM    History   Chief Complaint Chief Complaint  Patient presents with  . Abdominal Pain    HPI Courtney [name] is a 62 y.o. female.  HPI patient states she started getting abdominal pain in November has been persistent but she feels it is getting worse over the past week.  She states the pain is in her right lower quadrant and radiates into her right flank.  It is been there constantly since November.  She states the pain is dull, sharp, and burning.  She states when she gets up from a laying down position she gets a pulling sensation in her right lower quadrant.  She states going to the bathroom to urinate or have a bowel movement seems to make the pressure a little bit better.  She denies fever, chills, dysuria, hematuria, diarrhea, or being constipated.  She states she feels hungry however when she eats she feels full.  She describes a 13 pound weight loss in the past 2 months.  She has had nausea without vomiting.  She does not feel that she is constipated, she states she has had 3-4 bowel movements in the past week which is more than her usual.  She denies having hard stools or her bowels.  She denies fever or chills.  Patient had a CT scan of her abdomen and pelvis on November 24 which showed colonic diverticulosis without diverticulitis, status post cholecystectomy with mild diffuse biliary duct dilatation.  No acute findings to explain her pain.  She had an MR CP done on January 11 ordered by gastroenterology.  At that point the complaint was epigastric pain.  They did see a small fluid signal intensity structure just anterior to the ascending colon potentially a small duplication cyst or a small fluid-filled diverticulum.  She had a nuclear medicine gastric emptying study done on January 22 which was normal.  She was seen by gastroenterology on January  24 and was started on Cipro and Flagyl for possible diverticulitis with this abnormal area seen on MRI and her descending colon.  Patient states she still has several days of antibiotics to take.  She feels like that she is getting worse instead of better.  She denies running out of her chronic pain medication.  She states she is a "chronic pain patient" for "pain all over" from [facility].  She states she has a neurologist in [place] and she currently is not being treated specifically for her MS.  Patient is scheduled to have a EDG ultrasound done next week.  PCP Lucianne Lei, MD   Past Medical History:  Diagnosis Date  . Abnormal ECG 07/18/2015  . Arthritis   . Asthma   . Bowel obstruction ([place])   . Depression with anxiety 02/09/2015  . Diabetes mellitus ([place]) 07/18/2015  . Diverticulitis   . Essential tremor 03/31/2016  . Fibromyalgia   . Gait difficulty 02/09/2015  . Gallstones   . GERD (gastroesophageal reflux disease)   . HLD (hyperlipidemia)   . Hypersomnia 11/09/2015  . Hypertension   . Hyperthyroidism   . Hypothyroidism   . IBS (irritable bowel syndrome)   . Insomnia 02/09/2015  . Migraines   . MS (multiple sclerosis) ([place])   . Multiple joint pain 03/31/2016  . Numbness in both hands 03/18/2016  . Other fatigue 02/09/2015  . Peptic ulcer   .  Pneumonia   . Urinary hesitancy 02/09/2015  . Vision abnormalities     Patient Active Problem List   Diagnosis Date Noted  . RLQ abdominal pain 03/27/2017  . Bloating 03/27/2017  . Abnormal finding on imaging 03/27/2017  . Dilation of biliary tract 03/27/2017  . Nausea 03/27/2017  . Multiple joint pain 03/31/2016  . Essential tremor 03/31/2016  . Numbness in both hands 03/18/2016  . Hypersomnia 11/09/2015  . Diabetes mellitus ([place]) 07/18/2015  . Essential hypertension 07/18/2015  . Abnormal ECG 07/18/2015  . Gait difficulty 02/09/2015  . Fibromyalgia 02/09/2015  . Depression with anxiety 02/09/2015  . Numbness 02/09/2015  . Insomnia  02/09/2015  . Urinary hesitancy 02/09/2015  . Other fatigue 02/09/2015    Past Surgical History:  Procedure Laterality Date  . ABDOMINAL HYSTERECTOMY  1976   including oophorectomy, due to uterine cancer  . APPENDECTOMY  1974  . CHOLECYSTECTOMY     2006  . COLON SURGERY    . CTR    . HERNIA REPAIR    . MENISCUS REPAIR Left   . SMALL INTESTINE SURGERY      OB History    No data available       Home Medications    Prior to Admission medications   Medication Sig Start Date End Date Taking? Authorizing Provider  albuterol (PROVENTIL HFA;VENTOLIN HFA) 108 (90 Base) MCG/ACT inhaler Inhale 2 puffs into the lungs every 6 (six) hours as needed for wheezing or shortness of breath. 12/19/15  Yes Whiteheart, Cristal Ford, NP  alprazolam Duanne Moron) 2 MG tablet Take 2 mg by mouth 3 (three) times daily as needed for anxiety.    Yes [provider]  baclofen (LIORESAL) 10 MG tablet Take 1 to 3 pills at night Patient taking differently: Take 1 to 3 pills at night as needed 06/13/16  Yes Sater, Nanine Means, MD  Biotin 1000 MCG tablet Take 1,000 mcg by mouth daily.   Yes [provider]  butalbital-acetaminophen-caffeine (FIORICET, ESGIC) 50-325-40 MG tablet Take 1 tablet by mouth 2 (two) times daily as needed for headache.    Yes [provider]  ciprofloxacin (CIPRO) 500 MG tablet Take 1 tablet (500 mg total) by mouth 2 (two) times daily. Take for 10 days 03/26/17  Yes Zehr, Janett Billow D, PA-C  gabapentin (NEURONTIN) 300 MG capsule Take 300 mg by mouth as needed (pain).    Yes [provider]  insulin aspart protamine- aspart (NOVOLOG MIX 70/30) (70-30) 100 UNIT/ML injection 35 units with breakfast, and 25 units with supper Patient taking differently: 14 units with breakfast, and 22 units with supper 10/22/16  Yes Renato Shin, MD  lidocaine (XYLOCAINE) 5 % ointment Apply 1 application topically daily as needed for mild pain.   Yes [provider]  metroNIDAZOLE  (FLAGYL) 500 MG tablet Take 1 tablet (500 mg total) by mouth 3 (three) times daily. Take for 10 days 03/26/17  Yes Zehr, Laban Emperor, PA-C  naloxone [facility]) nasal spray 4 mg/0.1 mL Place 1 spray into the nose daily as needed (overdose).   Yes [provider]  ondansetron (ZOFRAN ODT) 4 MG disintegrating tablet Take 1 tablet (4 mg total) by mouth every 6 (six) hours as needed for nausea or vomiting. 03/26/17  Yes Zehr, Laban Emperor, PA-C  oxyCODONE (ROXICODONE) 15 MG immediate release tablet Take 1 tablet by mouth 5 times daily as needed for pain 03/17/16  Yes [provider]  OXYCONTIN 40 MG 12 hr tablet TAKE ONE TABLET BY  MOUTH EVERY TWELVE HOURS AS NEEDED FOR PAIN 02/02/17  Yes [provider]  pantoprazole (PROTONIX) 40 MG tablet Take 40 mg by mouth daily.   Yes [provider]  dicyclomine (BENTYL) 20 MG tablet Take 1 tablet (20 mg total) by mouth 2 (two) times daily. Patient not taking: Reported on 04/01/2017 01/24/17   Jeannett Senior, PA-C  methocarbamol (ROBAXIN) 500 MG tablet Take 1 or 2 po Q 6hrs for muscle pain 04/02/17   Rolland Porter, MD  promethazine (PHENERGAN) 25 MG tablet Take 1 tablet (25 mg total) by mouth every 6 (six) hours as needed for nausea or vomiting. Patient not taking: Reported on 04/01/2017 01/24/17   Jeannett Senior, PA-C    Family History Family History  Problem Relation Age of Onset  . Heart disease Mother   . Kidney disease Mother   . Diabetes Mother   . Multiple sclerosis Daughter   . Multiple sclerosis Other   . Diabetes Sister   . Alcohol abuse Brother        ETOH and marijuana  . Diabetes Brother   . Diabetes Sister   . Diabetes Brother     Social History Social History   Tobacco Use  . Smoking status: Former Smoker    Packs/day: 0.25    Years: 5.00    Pack years: 1.25    Types: Cigarettes    Last attempt to quit: 1990    Years since quitting: 29.1  . Smokeless tobacco: Never Used  Substance Use Topics  .  Alcohol use: Yes    Comment: socially only  . Drug use: No  lives at home Lives with spouse   Allergies   Ibuprofen; Prednisone; Solu-medrol [methylprednisolone]; Bactrim [sulfamethoxazole-trimethoprim]; Biaxin [clarithromycin]; and Naproxen   Review of Systems Review of Systems  All other systems reviewed and are negative.    Physical Exam Updated Vital Signs BP 134/79   Pulse 70   Temp 98.1 F (36.7 C) (Oral)   Resp 18   Ht 5' 2.5" (1.588 m)   Wt 72.6 kg (160 lb)   SpO2 97%   BMI 28.80 kg/m   Physical Exam  Constitutional: She is oriented to person, place, and time. She appears well-developed and well-nourished.  Non-toxic appearance. She does not appear ill. No distress.  HENT:  Head: Normocephalic and atraumatic.  Right Ear: External ear normal.  Left Ear: External ear normal.  Nose: Nose normal. No mucosal edema or rhinorrhea.  Mouth/Throat: Mucous membranes are dry. No dental abscesses or uvula swelling.  Eyes: Conjunctivae and EOM are normal. Pupils are equal, round, and reactive to light.  Neck: Normal range of motion and full passive range of motion without pain. Neck supple.  Cardiovascular: Normal rate, regular rhythm and normal heart sounds. Exam reveals no gallop and no friction rub.  No murmur heard. Pulmonary/Chest: Effort normal and breath sounds normal. No respiratory distress. She has no wheezes. She has no rhonchi. She has no rales. She exhibits no tenderness and no crepitus.  Abdominal: Soft. Normal appearance and bowel sounds are normal. She exhibits no distension. There is tenderness in the right lower quadrant. There is no rebound and no guarding.    Musculoskeletal: Normal range of motion. She exhibits no edema or tenderness.       Back:  Moves all extremities well.  Tender diffusely in her lumbar spine.  Neurological: She is alert and oriented to person, place, and time. She has normal strength. No cranial nerve deficit.  Skin: Skin  is warm,  dry and intact. No rash noted. No erythema. No pallor.  Psychiatric: She has a normal mood and affect. Her speech is normal and behavior is normal. Her mood appears not anxious.  Nursing note and vitals reviewed.    ED Treatments / Results  Labs (all labs ordered are listed, but only abnormal results are displayed) Results for orders placed or performed during the hospital encounter of 04/01/17  Lipase, blood  Result Value Ref Range   Lipase 20 11 - 51 U/L  Comprehensive metabolic panel  Result Value Ref Range   Sodium 140 135 - 145 mmol/L   Potassium 4.0 3.5 - 5.1 mmol/L   Chloride 103 101 - 111 mmol/L   CO2 26 22 - 32 mmol/L   Glucose, Bld 206 (H) 65 - 99 mg/dL   BUN 13 6 - 20 mg/dL   Creatinine, Ser 1.02 (H) 0.44 - 1.00 mg/dL   Calcium 9.4 8.9 - 10.3 mg/dL   Total Protein 8.2 (H) 6.5 - 8.1 g/dL   Albumin 4.5 3.5 - 5.0 g/dL   AST 37 15 - 41 U/L   ALT 32 14 - 54 U/L   Alkaline Phosphatase 100 38 - 126 U/L   Total Bilirubin 0.1 (L) 0.3 - 1.2 mg/dL   GFR calc non Af Amer 58 (L) >60 mL/min   GFR calc Af Amer >60 >60 mL/min   Anion gap 11 5 - 15  CBC  Result Value Ref Range   WBC 7.8 4.0 - 10.5 K/uL   RBC 4.96 3.87 - 5.11 MIL/uL   Hemoglobin 14.0 12.0 - 15.0 g/dL   HCT 41.3 36.0 - 46.0 %   MCV 83.3 78.0 - 100.0 fL   MCH 28.2 26.0 - 34.0 pg   MCHC 33.9 30.0 - 36.0 g/dL   RDW 13.0 11.5 - 15.5 %   Platelets 151 150 - 400 K/uL  Urinalysis, Routine w reflex microscopic  Result Value Ref Range   Color, Urine YELLOW YELLOW   APPearance CLEAR CLEAR   Specific Gravity, Urine 1.008 1.005 - 1.030   pH 6.0 5.0 - 8.0   Glucose, UA >=500 (A) NEGATIVE mg/dL   Hgb urine dipstick NEGATIVE NEGATIVE   Bilirubin Urine NEGATIVE NEGATIVE   Ketones, ur 5 (A) NEGATIVE mg/dL   Protein, ur NEGATIVE NEGATIVE mg/dL   Nitrite NEGATIVE NEGATIVE   Leukocytes, UA NEGATIVE NEGATIVE   RBC / HPF NONE SEEN 0 - 5 RBC/hpf   WBC, UA 0-5 0 - 5 WBC/hpf   Bacteria, UA FEW (A) NONE SEEN   Squamous  Epithelial / LPF NONE SEEN NONE SEEN   Mucus PRESENT    Laboratory interpretation all normal    EKG  EKG Interpretation None       Radiology Ct Abdomen Pelvis W Contrast  Result Date: 04/02/2017 CLINICAL DATA:  Right lower quadrant pain for 1 month. History of peptic ulcer, multiple sclerosis, irritable bowel syndrome, hypertension, diabetes, gastroesophageal reflux disease, diverticulitis, bowel obstructions, surgery to the colon and small intestines, hernia repair, cholecystectomy, appendectomy, and hysterectomy. EXAM: CT ABDOMEN AND PELVIS WITH CONTRAST TECHNIQUE: Multidetector CT imaging of the abdomen and pelvis was performed using the standard protocol following bolus administration of intravenous contrast. CONTRAST:  100 mL Isovue-300 COMPARISON:  MRI abdomen 03/13/2017. CT abdomen and pelvis 01/24/2017 FINDINGS: Lower chest: Mild dependent changes in the lung bases. Hepatobiliary: Surgical absence of the gallbladder. Mild intra and extrahepatic bile duct dilatation is unchanged since previous studies and likely reflects postoperative  physiology. No focal liver lesions. Pancreas: Unremarkable. No pancreatic ductal dilatation or surrounding inflammatory changes. Spleen: Normal in size without focal abnormality. Adrenals/Urinary Tract: Adrenal glands are unremarkable. Kidneys are normal, without renal calculi, focal lesion, or hydronephrosis. Bladder is unremarkable. Stomach/Bowel: Stomach, small bowel, and colon are not abnormally distended. There are scattered diverticula throughout the colon. Surgical clips consistent with small bowel anastomosis in the right and left mid abdomen. Mild bowel dilatation at the site of anastomosis, likely representing normal postoperative change. No wall thickening or inflammatory stranding is appreciated. Appendix is surgically absent. Vascular/Lymphatic: No significant vascular findings are present. No enlarged abdominal or pelvic lymph nodes. Reproductive:  Status post hysterectomy. No adnexal masses. Other: No free air or free fluid in the abdomen. Small anterior abdominal wall hernias containing fat, likely post incisional. No bowel herniation. Musculoskeletal: No acute or significant osseous findings. IMPRESSION: No acute process demonstrated in the abdomen or pelvis. No evidence of bowel obstruction or inflammation. Postoperative changes as described. Electronically Signed   By: Lucienne Capers M.D.   On: 04/02/2017 01:35      Nm Gastric Emptying  Result Date: 03/24/2017 CLINICAL DATA:  62 year old female with a history of abdominal pain, nausea, vomiting. IMPRESSION: Normal gastric emptying study. Electronically Signed   By: Corrie Mckusick D.O.   On: 03/24/2017 16:54   Mr 3d Recon At Scanner  Result Date: 03/13/2017 CLINICAL DATA:  Epigastric abdominal pain with bloating and dilated common bile duct, for further investigation  IMPRESSION: 1. A specific cause for the patient's epigastric abdominal pain and bloating is not identified. 2. mild intrahepatic and extrahepatic biliary dilatation is most likely a physiologic response to cholecystectomy given the lack of other cause identified. 3. Small fluid signal intensity structure just anterior to the ascending colon, potentially a small duplication cyst or a small fluid-filled diverticulum. 4. Descending colon diverticulosis. Electronically Signed   By: Van Clines M.D.   On: 03/13/2017 19:51   Mr Abdomen Mrcp Moise Boring Contast  Result Date: 03/13/2017 CLINICAL DATA:  Epigastric abdominal pain with bloating and dilated common bile duct, for further investigation  IMPRESSION: 1. A specific cause for the patient's epigastric abdominal pain and bloating is not identified. 2. mild intrahepatic and extrahepatic biliary dilatation is most likely a physiologic response to cholecystectomy given the lack of other cause identified. 3. Small fluid signal intensity structure just anterior to the ascending colon,  potentially a small duplication cyst or a small fluid-filled diverticulum. 4. Descending colon diverticulosis. Electronically Signed   By: Van Clines M.D.   On: 03/13/2017 19:51    Procedures Procedures (including critical care time)  Medications Ordered in ED Medications  sodium chloride 0.9 % bolus 1,000 mL (0 mLs Intravenous Stopped 04/02/17 0335)  sodium chloride 0.9 % bolus 500 mL (0 mLs Intravenous Stopped 04/02/17 0320)  ondansetron (ZOFRAN) injection 4 mg (4 mg Intravenous Given 04/02/17 0110)  HYDROmorphone (DILAUDID) injection 1 mg (1 mg Intravenous Given 04/02/17 0110)  iopamidol (ISOVUE-300) 61 % injection 100 mL (100 mLs Intravenous Contrast Given 04/02/17 0116)     Initial Impression / Assessment and Plan / ED Course  I have reviewed the triage vital signs and the nursing notes.  Pertinent labs & imaging results that were available during my care of the patient were reviewed by me and considered in my medical decision making (see chart for details).    Patient was given IV fluids and IV pain and nausea medication.  We discussed her laboratory tests which  were very unrevealing.  Due to her saying her pain is getting worse a CT of her abdomen/pelvis was ordered for tonight.  Recheck at 2:45 AM patient was given her CT results which showed no acute findings.  She seemed relieved.  I discussed that since her pain seems worse when she bends over and feels a tightening in her abdomen I am going to add a muscle relaxer and she should use ice and heat for comfort, her pain may be musculoskeletal.  She also could be having radiculopathy from her back.  However CT scans and MRI do not describe any significant lumbar pathology.Marland Kitchen  Unfortunately patient is already on heavy doses of narcotic pain medication and benzodiazepines so I am not going to change that regimen.    Review of the [place] shows patient received #150 oxycodone 20 mg tablets monthly, last filled  January 8, #60 OxyContin ER 40 mg tablets last filled January 8, and she got an additional #40 oxycodone 20 mg tablets on December 31.  She also gets #270 alprazolam 2 mg tablets every 3 months by her PCP.  Final Clinical Impressions(s) / ED Diagnoses   Final diagnoses:  Chronic RLQ pain    ED Discharge Orders        Ordered    methocarbamol (ROBAXIN) 500 MG tablet     04/02/17 0308      Plan discharge  Rolland Porter, MD, Barbette Or, MD 04/02/17 [phone]

## 2017-04-02 NOTE — Discharge Instructions (Signed)
Use ice and heat for comfort. Take the muscle relaxer, robaxin, to see if that helps with your pain. Keep your appointments with the gastroenterologist about finishing your evaluation of your abdominal pain. You might try a lidocaine patch to see if that helps with your pain.  You will need to talk to your pain management doctor about your pain medications.

## 2017-04-03 DIAGNOSIS — M79606 Pain in leg, unspecified: Secondary | ICD-10-CM | POA: Diagnosis not present

## 2017-04-03 DIAGNOSIS — M797 Fibromyalgia: Secondary | ICD-10-CM | POA: Diagnosis not present

## 2017-04-03 DIAGNOSIS — Z79891 Long term (current) use of opiate analgesic: Secondary | ICD-10-CM | POA: Diagnosis not present

## 2017-04-03 DIAGNOSIS — G894 Chronic pain syndrome: Secondary | ICD-10-CM | POA: Diagnosis not present

## 2017-04-03 DIAGNOSIS — M545 Low back pain: Secondary | ICD-10-CM | POA: Diagnosis not present

## 2017-04-03 DIAGNOSIS — M542 Cervicalgia: Secondary | ICD-10-CM | POA: Diagnosis not present

## 2017-04-03 DIAGNOSIS — E1140 Type 2 diabetes mellitus with diabetic neuropathy, unspecified: Secondary | ICD-10-CM | POA: Diagnosis not present

## 2017-04-03 DIAGNOSIS — M25519 Pain in unspecified shoulder: Secondary | ICD-10-CM | POA: Diagnosis not present

## 2017-04-09 ENCOUNTER — Ambulatory Visit (HOSPITAL_COMMUNITY): Payer: Medicare Other | Admitting: Anesthesiology

## 2017-04-09 ENCOUNTER — Encounter (HOSPITAL_COMMUNITY): Payer: Self-pay

## 2017-04-09 ENCOUNTER — Encounter (HOSPITAL_COMMUNITY)
Admission: RE | Disposition: A | Discharge status: Home / Self Care | Payer: Self-pay | Source: Ambulatory Visit | Attending: Gastroenterology

## 2017-04-09 ENCOUNTER — Other Ambulatory Visit: Payer: Self-pay

## 2017-04-09 ENCOUNTER — Ambulatory Visit (HOSPITAL_COMMUNITY)
Admission: RE | Admit: 2017-04-09 | Discharge: 2017-04-09 | Disposition: A | Discharge status: Home / Self Care | Payer: Medicare Other | Source: Ambulatory Visit | Attending: Gastroenterology | Admitting: Gastroenterology

## 2017-04-09 DIAGNOSIS — M797 Fibromyalgia: Secondary | ICD-10-CM | POA: Diagnosis not present

## 2017-04-09 DIAGNOSIS — G35 Multiple sclerosis: Secondary | ICD-10-CM | POA: Diagnosis not present

## 2017-04-09 DIAGNOSIS — Z888 Allergy status to other drugs, medicaments and biological substances status: Secondary | ICD-10-CM | POA: Insufficient documentation

## 2017-04-09 DIAGNOSIS — Z9049 Acquired absence of other specified parts of digestive tract: Secondary | ICD-10-CM | POA: Insufficient documentation

## 2017-04-09 DIAGNOSIS — J45909 Unspecified asthma, uncomplicated: Secondary | ICD-10-CM | POA: Insufficient documentation

## 2017-04-09 DIAGNOSIS — F418 Other specified anxiety disorders: Secondary | ICD-10-CM | POA: Insufficient documentation

## 2017-04-09 DIAGNOSIS — Z90710 Acquired absence of both cervix and uterus: Secondary | ICD-10-CM | POA: Insufficient documentation

## 2017-04-09 DIAGNOSIS — I10 Essential (primary) hypertension: Secondary | ICD-10-CM | POA: Diagnosis not present

## 2017-04-09 DIAGNOSIS — R1031 Right lower quadrant pain: Secondary | ICD-10-CM | POA: Diagnosis not present

## 2017-04-09 DIAGNOSIS — E119 Type 2 diabetes mellitus without complications: Secondary | ICD-10-CM | POA: Diagnosis not present

## 2017-04-09 DIAGNOSIS — Z9889 Other specified postprocedural states: Secondary | ICD-10-CM | POA: Diagnosis not present

## 2017-04-09 DIAGNOSIS — G8929 Other chronic pain: Secondary | ICD-10-CM | POA: Insufficient documentation

## 2017-04-09 DIAGNOSIS — G4710 Hypersomnia, unspecified: Secondary | ICD-10-CM | POA: Diagnosis not present

## 2017-04-09 DIAGNOSIS — Z87891 Personal history of nicotine dependence: Secondary | ICD-10-CM | POA: Diagnosis not present

## 2017-04-09 DIAGNOSIS — K219 Gastro-esophageal reflux disease without esophagitis: Secondary | ICD-10-CM | POA: Insufficient documentation

## 2017-04-09 DIAGNOSIS — Z794 Long term (current) use of insulin: Secondary | ICD-10-CM | POA: Insufficient documentation

## 2017-04-09 DIAGNOSIS — Z886 Allergy status to analgesic agent status: Secondary | ICD-10-CM | POA: Insufficient documentation

## 2017-04-09 DIAGNOSIS — Z882 Allergy status to sulfonamides status: Secondary | ICD-10-CM | POA: Diagnosis not present

## 2017-04-09 DIAGNOSIS — Z79899 Other long term (current) drug therapy: Secondary | ICD-10-CM | POA: Diagnosis not present

## 2017-04-09 DIAGNOSIS — R110 Nausea: Secondary | ICD-10-CM | POA: Diagnosis not present

## 2017-04-09 DIAGNOSIS — R634 Abnormal weight loss: Secondary | ICD-10-CM | POA: Insufficient documentation

## 2017-04-09 DIAGNOSIS — G4700 Insomnia, unspecified: Secondary | ICD-10-CM | POA: Insufficient documentation

## 2017-04-09 DIAGNOSIS — K838 Other specified diseases of biliary tract: Secondary | ICD-10-CM | POA: Insufficient documentation

## 2017-04-09 DIAGNOSIS — G250 Essential tremor: Secondary | ICD-10-CM | POA: Insufficient documentation

## 2017-04-09 HISTORY — DX: Nausea with vomiting, unspecified: R11.2

## 2017-04-09 HISTORY — DX: Cardiac arrhythmia, unspecified: I49.9

## 2017-04-09 HISTORY — PX: EUS: SHX5427

## 2017-04-09 HISTORY — DX: Nausea with vomiting, unspecified: Z98.890

## 2017-04-09 LAB — GLUCOSE, CAPILLARY: Glucose-Capillary: 145 mg/dL — ABNORMAL HIGH (ref 65–99)

## 2017-04-09 SURGERY — UPPER ENDOSCOPIC ULTRASOUND (EUS) LINEAR
Anesthesia: Monitor Anesthesia Care

## 2017-04-09 MED ORDER — SODIUM CHLORIDE 0.9 % IV SOLN: INTRAVENOUS | Status: DC

## 2017-04-09 MED ORDER — PROPOFOL 10 MG/ML IV BOLUS
INTRAVENOUS | Status: DC | PRN
  Administered 2017-04-09: 20 mg via INTRAVENOUS
  Administered 2017-04-09: 40 mg via INTRAVENOUS

## 2017-04-09 MED ORDER — LACTATED RINGERS IV SOLN
INTRAVENOUS | Status: DC
  Administered 2017-04-09: 1000 mL via INTRAVENOUS

## 2017-04-09 MED ORDER — SCOPOLAMINE 1 MG/3DAYS TD PT72
MEDICATED_PATCH | TRANSDERMAL | Status: DC | PRN
  Administered 2017-04-09: 1 via TRANSDERMAL

## 2017-04-09 MED ORDER — LIDOCAINE 2% (20 MG/ML) 5 ML SYRINGE
INTRAMUSCULAR | Status: DC | PRN
  Administered 2017-04-09: 50 mg via INTRAVENOUS

## 2017-04-09 MED ORDER — PROPOFOL 10 MG/ML IV BOLUS
INTRAVENOUS | Status: AC
  Filled 2017-04-09: qty 40

## 2017-04-09 MED ORDER — PROPOFOL 500 MG/50ML IV EMUL
INTRAVENOUS | Status: DC | PRN
  Administered 2017-04-09: 50 ug/kg/min via INTRAVENOUS

## 2017-04-09 MED ORDER — SCOPOLAMINE 1 MG/3DAYS TD PT72
MEDICATED_PATCH | TRANSDERMAL | Status: AC
Start: 2017-04-09 — End: ?
  Filled 2017-04-09: qty 1

## 2017-04-09 MED ORDER — ONDANSETRON HCL 4 MG/2ML IJ SOLN
INTRAMUSCULAR | Status: DC | PRN
  Administered 2017-04-09: 4 mg via INTRAVENOUS

## 2017-04-09 NOTE — Anesthesia Preprocedure Evaluation (Signed)
Anesthesia Evaluation  Patient identified by MRN, date of birth, ID band Patient awake    Reviewed: Allergy & Precautions, NPO status , Patient's Chart, lab work & pertinent test results  History of Anesthesia Complications (+) PONV  Airway Mallampati: I  TM Distance: >3 FB Neck ROM: Full    Dental   Pulmonary asthma , former smoker,    Pulmonary exam normal        Cardiovascular hypertension, Pt. on medications Normal cardiovascular exam     Neuro/Psych Anxiety    GI/Hepatic GERD  Medicated and Controlled,  Endo/Other  diabetes, Type 2, Oral Hypoglycemic Agents  Renal/GU      Musculoskeletal   Abdominal   Peds  Hematology   Anesthesia Other Findings   Reproductive/Obstetrics                             Anesthesia Physical Anesthesia Plan  ASA: II  Anesthesia Plan: MAC   Post-op Pain Management:    Induction: Intravenous  PONV Risk Score and Plan: 3 and Ondansetron and Treatment may vary due to age or medical condition  Airway Management Planned: Simple Face Mask  Additional Equipment:   Intra-op Plan:   Post-operative Plan:   Informed Consent: I have reviewed the patients History and Physical, chart, labs and discussed the procedure including the risks, benefits and alternatives for the proposed anesthesia with the patient or authorized representative who has indicated his/her understanding and acceptance.     Plan Discussed with: CRNA and Surgeon  Anesthesia Plan Comments:         Anesthesia Quick Evaluation

## 2017-04-09 NOTE — Interval H&P Note (Signed)
History and Physical Interval Note:  04/09/2017 7:16 AM  Courtney [name]  has presented today for surgery, with the diagnosis of biliary dilation, weight loss  The various methods of treatment have been discussed with the patient and family. After consideration of risks, benefits and other options for treatment, the patient has consented to  Procedure(s): UPPER ENDOSCOPIC ULTRASOUND (EUS) LINEAR (N/A) as a surgical intervention .  [name] history has been reviewed, patient examined, no change in status, stable for surgery.  I have reviewed the patient's chart and labs.  Questions were answered to the patient's satisfaction.     Milus Banister

## 2017-04-09 NOTE — Transfer of Care (Signed)
Immediate Anesthesia Transfer of Care Note  Patient: Courtney [name]  Procedure(s) Performed: Procedure(s): UPPER ENDOSCOPIC ULTRASOUND (EUS) LINEAR (N/A)  Patient Location: PACU  Anesthesia Type:MAC  Level of Consciousness:  sedated, patient cooperative and responds to stimulation  Airway & Oxygen Therapy:Patient Spontanous Breathing and Patient connected to face mask oxgen  Post-op Assessment:  Report given to PACU RN and Post -op Vital signs reviewed and stable  Post vital signs:  Reviewed and stable  Last Vitals:  Vitals:   04/09/17 0647  BP: (!) 171/100  Pulse: 70  Resp: 12  Temp: 36.6 C  SpO2: 08%    Complications: No apparent anesthesia complications

## 2017-04-09 NOTE — Op Note (Signed)
[facility] Patient Name: Courtney [name] Procedure Date: 04/09/2017 MRN: [id number] Attending MD: Milus Banister , MD Date of Birth: 1955-04-20 CSN: [id number] Age: 63 Admit Type: Outpatient Procedure:                Upper EUS Indications:              Dilated bile duct, normal liver tests, s/p remote                            lap chole; has pains in RLQ, weight loss Providers:                Milus Banister, MD, Vista Lawman, RN, Cherylynn Ridges, Technician, Anne Fu CRNA, CRNA Referring MD:             Denzil Magnuson, MD; Lennice Sites, [place] Medicines:                Monitored Anesthesia Care Complications:            No immediate complications. Estimated blood loss:                            None. Estimated Blood Loss:     Estimated blood loss: none. Procedure:                Pre-Anesthesia Assessment:                           - Prior to the procedure, a History and Physical                            was performed, and patient medications and                            allergies were reviewed. [name] tolerance of                            previous anesthesia was also reviewed. The risks                            and benefits of the procedure and the sedation                            options and risks were discussed with the patient.                            All questions were answered, and informed consent                            was obtained. Prior Anticoagulants: The patient has                            taken no previous anticoagulant or antiplatelet  agents. ASA Grade Assessment: II - A patient with                            mild systemic disease. After reviewing the risks                            and benefits, the patient was deemed in                            satisfactory condition to undergo the procedure.                           After obtaining informed consent, the  endoscope was                            passed under direct vision. Throughout the                            procedure, the patient's blood pressure, pulse, and                            oxygen saturations were monitored continuously. The                            [id number] ([id number]) scope was introduced through                            the mouth, and advanced to the second part of                            duodenum. The was introduced through the mouth, and                            advanced to the second part of duodenum. The upper                            EUS was accomplished without difficulty. The                            patient tolerated the procedure well. Scope In: Scope Out: Findings:      Endoscopic Finding :      Normal UGI tract including duodenoscope side view of the major papilla       that was normal.      Endosonographic Finding :      1. Extrahepatic biliary tree was slightly dilated (68mm CBD, 84mm CHD)       without evident stones, tumors.      2. Main pancreatic duct was normal, non-dilated.      3. Pancreatic parenchyma was normal throughout the gland      4. No periportal, peripancreatic adenopathy.      5. Limited views of liver, spleen, portal and splenic vessels were all       normal. Impression:               - Endoscopically normal UGI tract  including good                            views of normal major papilla.                           - Mildly dilated extrahepatic bile duct (26mm CBD,                            18mm CHD) without evident pathology; this is likely                            a physiologic reaction to GB resection 10 years                            ago. LFTs are normal and her pain (RLQ, nearly                            right pelvis) is not related. Moderate Sedation:      N/A- Per Anesthesia Care Recommendation:           - Discharge patient to home (ambulatory).                           - Further workup for her RLQ pain  (consider                            colonoscopy given her several weeks of loose                            stools), per Dr. Silverio Decamp. Procedure Code(s):        --- Professional ---                           [phone], Esophagogastroduodenoscopy, flexible,                            transoral; with endoscopic ultrasound examination                            limited to the esophagus, stomach or duodenum, and                            adjacent structures Diagnosis Code(s):        --- Professional ---                           K83.8, Other specified diseases of biliary tract CPT copyright 2016 American Medical Association. All rights reserved. The codes documented in this report are preliminary and upon coder review may  be revised to meet current compliance requirements. Milus Banister, MD 04/09/2017 8:19:52 AM This report has been signed electronically. Number of Addenda: 0

## 2017-04-09 NOTE — Discharge Instructions (Signed)
YOU HAD AN ENDOSCOPIC PROCEDURE TODAY: Refer to the procedure report and other information in the discharge instructions given to you for any specific questions about what was found during the examination. If this information does not answer your questions, please call [place] office at [phone] to clarify.  ° °YOU SHOULD EXPECT: Some feelings of bloating in the abdomen. Passage of more gas than usual. Walking can help get rid of the air that was put into your GI tract during the procedure and reduce the bloating. If you had a lower endoscopy (such as a colonoscopy or flexible sigmoidoscopy) you may notice spotting of blood in your stool or on the toilet paper. Some abdominal soreness may be present for a day or two, also. ° °DIET: Your first meal following the procedure should be a light meal and then it is ok to progress to your normal diet. A half-sandwich or bowl of soup is an example of a good first meal. Heavy or fried foods are harder to digest and may make you feel nauseous or bloated. Drink plenty of fluids but you should avoid alcoholic beverages for 24 hours. If you had a esophageal dilation, please see attached instructions for diet.   ° °ACTIVITY: Your care partner should take you home directly after the procedure. You should plan to take it easy, moving slowly for the rest of the day. You can resume normal activity the day after the procedure however YOU SHOULD NOT DRIVE, use power tools, machinery or perform tasks that involve climbing or major physical exertion for 24 hours (because of the sedation medicines used during the test).  ° °SYMPTOMS TO REPORT IMMEDIATELY: °A gastroenterologist can be reached at any hour. Please call [phone]  for any of the following symptoms:  °Following lower endoscopy (colonoscopy, flexible sigmoidoscopy) °Excessive amounts of blood in the stool  °Significant tenderness, worsening of abdominal pains  °Swelling of the abdomen that is new, acute  °Fever of 100° or  higher  °Following upper endoscopy (EGD, EUS, ERCP, esophageal dilation) °Vomiting of blood or coffee ground material  °New, significant abdominal pain  °New, significant chest pain or pain under the shoulder blades  °Painful or persistently difficult swallowing  °New shortness of breath  °Black, tarry-looking or red, bloody stools ° °FOLLOW UP:  °If any biopsies were taken you will be contacted by phone or by letter within the next 1-3 weeks. Call [phone]  if you have not heard about the biopsies in 3 weeks.  °Please also call with any specific questions about appointments or follow up tests. ° °

## 2017-04-09 NOTE — Anesthesia Postprocedure Evaluation (Signed)
Anesthesia Post Note  Patient: Addylin Manke  Procedure(s) Performed: UPPER ENDOSCOPIC ULTRASOUND (EUS) LINEAR (N/A )     Patient location during evaluation: PACU Anesthesia Type: MAC Level of consciousness: awake and alert Pain management: pain level controlled Vital Signs Assessment: post-procedure vital signs reviewed and stable Respiratory status: spontaneous breathing, nonlabored ventilation, respiratory function stable and patient connected to nasal cannula oxygen Cardiovascular status: stable and blood pressure returned to baseline Postop Assessment: no apparent nausea or vomiting Anesthetic complications: no    Last Vitals:  Vitals:   04/09/17 0830 04/09/17 0840  BP: (!) 156/88 (!) 156/75  Pulse: (!) 59 (!) 58  Resp: 13 14  Temp:    SpO2: 99% 98%    Last Pain:  Vitals:   04/09/17 0812  TempSrc: Oral  PainSc:                  Paxtyn Wisdom DAVID

## 2017-04-10 ENCOUNTER — Encounter (HOSPITAL_COMMUNITY): Payer: Self-pay | Admitting: Gastroenterology

## 2017-04-14 ENCOUNTER — Ambulatory Visit: Payer: Self-pay | Admitting: Gastroenterology

## 2017-04-14 ENCOUNTER — Encounter: Payer: Self-pay | Admitting: Gastroenterology

## 2017-04-16 ENCOUNTER — Encounter: Payer: Self-pay | Admitting: Gastroenterology

## 2017-04-16 ENCOUNTER — Ambulatory Visit (INDEPENDENT_AMBULATORY_CARE_PROVIDER_SITE_OTHER): Payer: Medicare Other | Admitting: Gastroenterology

## 2017-04-16 VITALS — BP 112/70 | HR 72 | Ht 62.5 in | Wt 170.0 lb

## 2017-04-16 DIAGNOSIS — T404X5S Adverse effect of other synthetic narcotics, sequela: Secondary | ICD-10-CM

## 2017-04-16 DIAGNOSIS — K5900 Constipation, unspecified: Secondary | ICD-10-CM

## 2017-04-16 DIAGNOSIS — R1084 Generalized abdominal pain: Secondary | ICD-10-CM | POA: Diagnosis not present

## 2017-04-16 DIAGNOSIS — R159 Full incontinence of feces: Secondary | ICD-10-CM

## 2017-04-16 DIAGNOSIS — T40495S Adverse effect of other synthetic narcotics, sequela: Secondary | ICD-10-CM

## 2017-04-16 NOTE — Patient Instructions (Addendum)
Your recall colonoscopy will be in August 2019, we will send you a reminder letter  We will send office note over to your pain management doctor  If you are age 63 or older, your body mass index should be between 23-30. Your Body mass index is 30.6 kg/m. If this is out of the aforementioned range listed, please consider follow up with your Primary Care Provider.  If you are age 68 or younger, your body mass index should be between 19-25. Your Body mass index is 30.6 kg/m. If this is out of the aformentioned range listed, please consider follow up with your Primary Care Provider.

## 2017-04-21 NOTE — Progress Notes (Signed)
Courtney [name]    [id number]    1956/01/16  Primary Care Physician:Bland, Myra Rude, MD  Referring Physician: Lucianne Lei, MD [address] [place], [place] [address]  Chief complaint: Abdominal pain  HPI:  89 yr F with multiple co morbid conditions including multiple sclerosis, DM, fibromyalgia, back pain and chronic abdominal pain is here to establish care. Patient has been seeing physicians in [place], she has family in [place] whom she visits frequently and has been mostly getting her care there. She wants to switch to doctors in [place]. Patient complains of generalized abdominal pain and back pain. She also has intermittent fecal incontinence, constipation alternating with diarrhea. She is currently not on any treatment for multiple sclerosis, says she follows with Neurologist in [place] regularly and trying to find a neurologist here. She is on high dose narcotics for chronic pain through pain management clinic. She had dilation of intra hepatic and extrahepatic biliary ducts on MRCP, subsequent EUS by Dr Ardis Hughs did not show any specific lesions other than mild dilation of CBD and intrahepatic biliary ducts. Denies any  vomiting, abdominal pain, melena or bright red blood per rectum. She has chronic intermittent nausea.   EGD 02/23/2017: Normal Colonoscopy in October 2014 with removal of tubular adenoma.    Outpatient Encounter Medications as of 04/16/2017  Medication Sig  . albuterol (PROVENTIL HFA;VENTOLIN HFA) 108 (90 Base) MCG/ACT inhaler Inhale 2 puffs into the lungs every 6 (six) hours as needed for wheezing or shortness of breath.  . alprazolam (XANAX) 2 MG tablet Take 2 mg by mouth 3 (three) times daily as needed for anxiety.   . baclofen (LIORESAL) 10 MG tablet Take 1 to 3 pills at night (Patient taking differently: Take 1 to 3 pills at night as needed)  . Biotin 1000 MCG tablet Take 1,000 mcg by mouth daily.  . butalbital-acetaminophen-caffeine (FIORICET,  ESGIC) 50-325-40 MG tablet Take 1 tablet by mouth 2 (two) times daily as needed for headache.   . gabapentin (NEURONTIN) 300 MG capsule Take 300 mg by mouth as needed (pain).   . insulin aspart protamine- aspart (NOVOLOG MIX 70/30) (70-30) 100 UNIT/ML injection 35 units with breakfast, and 25 units with supper (Patient taking differently: 14 units with breakfast, and 22 units with supper)  . lidocaine (XYLOCAINE) 5 % ointment Apply 1 application topically daily as needed for mild pain.  . methocarbamol (ROBAXIN) 500 MG tablet Take 1 or 2 po Q 6hrs for muscle pain (Patient taking differently: as needed. )  . naloxone (NARCAN) nasal spray 4 mg/0.1 mL Place 1 spray into the nose daily as needed (overdose).  . ondansetron (ZOFRAN ODT) 4 MG disintegrating tablet Take 1 tablet (4 mg total) by mouth every 6 (six) hours as needed for nausea or vomiting.  Marland Kitchen oxyCODONE (ROXICODONE) 15 MG immediate release tablet Take 1 tablet by mouth 5 times daily as needed for pain  . OXYCONTIN 40 MG 12 hr tablet TAKE ONE TABLET BY MOUTH EVERY TWELVE HOURS AS NEEDED FOR PAIN  . pantoprazole (PROTONIX) 40 MG tablet Take 40 mg by mouth daily.  . [DISCONTINUED] ciprofloxacin (CIPRO) 500 MG tablet Take 1 tablet (500 mg total) by mouth 2 (two) times daily. Take for 10 days  . [DISCONTINUED] dicyclomine (BENTYL) 20 MG tablet Take 1 tablet (20 mg total) by mouth 2 (two) times daily. (Patient not taking: Reported on 04/01/2017)  . [DISCONTINUED] metroNIDAZOLE (FLAGYL) 500 MG tablet  Take 1 tablet (500 mg total) by mouth 3 (three) times daily. Take for 10 days  . [DISCONTINUED] promethazine (PHENERGAN) 25 MG tablet Take 1 tablet (25 mg total) by mouth every 6 (six) hours as needed for nausea or vomiting.   No facility-administered encounter medications on file as of 04/16/2017.     Allergies as of 04/16/2017 - Review Complete 04/16/2017  Allergen Reaction Noted  . Ibuprofen Other (See Comments) 10/20/2015  . Prednisone Anaphylaxis,  Shortness Of Breath, and Palpitations 02/03/2015  . Solu-medrol [methylprednisolone] Anaphylaxis, Shortness Of Breath, and Palpitations 02/03/2015  . Bactrim [sulfamethoxazole-trimethoprim] Other (See Comments) 02/03/2015  . Biaxin [clarithromycin] Swelling 02/03/2015  . Naproxen Other (See Comments) 10/20/2015    Past Medical History:  Diagnosis Date  . Abnormal ECG 07/18/2015  . Arthritis   . Asthma   . Bowel obstruction ([place])   . Depression with anxiety 02/09/2015   mild xanax prn  . Diabetes mellitus ([place]) 07/18/2015   type 2  . Diverticulitis   . Dysrhythmia    abnormal ekgs worked up  . Essential tremor 03/31/2016  . Fibromyalgia   . Gait difficulty 02/09/2015  . Gallstones   . GERD (gastroesophageal reflux disease)   . HLD (hyperlipidemia)   . Hypersomnia 11/09/2015  . Hypertension    no meds  . Hyperthyroidism   . Hypothyroidism   . IBS (irritable bowel syndrome)   . Insomnia 02/09/2015  . Migraines   . MS (multiple sclerosis) ([place])   . Multiple joint pain 03/31/2016  . Numbness in both hands 03/18/2016  . Other fatigue 02/09/2015  . Peptic ulcer   . Pneumonia   . PONV (postoperative nausea and vomiting)   . Urinary hesitancy 02/09/2015  . Vision abnormalities     Past Surgical History:  Procedure Laterality Date  . ABDOMINAL HYSTERECTOMY  1976   including oophorectomy, due to uterine cancer  . APPENDECTOMY  1974  . CARPAL TUNNEL RELEASE     carpal tunnel surgery  . CHOLECYSTECTOMY     2006  . COLON SURGERY     [facility]  . EUS N/A 04/09/2017   Procedure: UPPER ENDOSCOPIC ULTRASOUND (EUS) LINEAR;  Surgeon: Milus Banister, MD;  Location: [facility] ENDOSCOPY;  Service: Endoscopy;  Laterality: N/A;  . HERNIA REPAIR    . MENISCUS REPAIR Left   . SMALL INTESTINE SURGERY      Family History  Problem Relation Age of Onset  . Heart disease Mother   . Kidney disease Mother   . Diabetes Mother   . Multiple sclerosis Daughter   . Multiple sclerosis Other   . Diabetes Sister    . Alcohol abuse Brother        ETOH and marijuana  . Diabetes Brother   . Diabetes Sister   . Diabetes Brother     Social History   Socioeconomic History  . Marital status: Married    Spouse name: Not on file  . Number of children: 2  . Years of education: Not on file  . Highest education level: Not on file  Social Needs  . Financial resource strain: Not on file  . Food insecurity - worry: Not on file  . Food insecurity - inability: Not on file  . Transportation needs - medical: Not on file  . Transportation needs - non-medical: Not on file  Occupational History  . Occupation: disabled  Tobacco Use  . Smoking status: Former Smoker    Packs/day: 0.25    Years: 5.00  Pack years: 1.25    Types: Cigarettes    Last attempt to quit: 1990    Years since quitting: 29.1  . Smokeless tobacco: Never Used  Substance and Sexual Activity  . Alcohol use: No    Frequency: Never  . Drug use: No  . Sexual activity: Yes  Other Topics Concern  . Not on file  Social History Narrative  . Not on file      Review of systems: Review of Systems  Constitutional: Negative for fever and chills.  HENT: Negative.   Eyes: Negative for blurred vision.  Respiratory: Negative for cough, shortness of breath and wheezing.   Cardiovascular: Negative for chest pain and palpitations.  Gastrointestinal: as per HPI Genitourinary: Negative for dysuria, urgency, frequency and hematuria.  Musculoskeletal: Negative for myalgias, back pain and joint pain.  Skin: Negative for itching and rash.  Neurological: Negative for dizziness, tremors, focal weakness, seizures and loss of consciousness.  Endo/Heme/Allergies: Positive for seasonal allergies.  Psychiatric/Behavioral: Negative for depression, suicidal ideas and hallucinations.  All other systems reviewed and are negative.   Physical Exam: Vitals:   04/16/17 1024  BP: 112/70  Pulse: 72   Body mass index is 30.6 kg/m. Gen:      No acute  distress HEENT:  EOMI, sclera anicteric Neck:     No masses; no thyromegaly Lungs:    Clear to auscultation bilaterally; normal respiratory effort CV:         Regular rate and rhythm; no murmurs Abd:      + bowel sounds; soft, non-tender; no palpable masses, no distension Ext:    No edema; adequate peripheral perfusion Skin:      Warm and dry; no rash Neuro: alert and oriented x 3 Psych: normal mood and affect  Data Reviewed:  Reviewed labs, radiology imaging, old records and pertinent past GI work up   Assessment and Plan/Recommendations:  3 yr F with history of multiple sclerosis, fibromyalgia, chronic pain on high dose narcotics (Oxycontin 40mg  BID and Oxycodone 15mg  q4-6h PRN), alternating constipation and diarrhea , intermittent fecal incontinence here for follow up visit.  Mild CBD dilation likely secondary to chronic narcotics History of tubular adenoma removed Aug 2014, due for recall surveillance colonoscopy in August 2019 Irregular bowel habits secondary to chronic narcotics, she is not interested in starting low dose laxatives due to episodes of fecal incontinence, ?overflow diarrhea or secondary to multiple sclerosis.  She is followed by pain management  Return as needed  25 minutes was spent face-to-face with the patient. Greater than 50% of the time used for counseling as well as treatment plan and follow-up. She had multiple questions which were answered to her satisfaction  K. Denzil Magnuson , MD [phone] Mon-Fri 8a-5p [phone] after 5p, weekends, holidays  CC: Lucianne Lei, MD

## 2017-04-27 ENCOUNTER — Encounter: Payer: Medicare Other | Admitting: Psychology

## 2017-04-27 ENCOUNTER — Encounter: Payer: Medicare Other | Attending: Psychology | Admitting: Psychology

## 2017-04-27 ENCOUNTER — Ambulatory Visit: Payer: Self-pay | Admitting: Gastroenterology

## 2017-04-27 DIAGNOSIS — F331 Major depressive disorder, recurrent, moderate: Secondary | ICD-10-CM | POA: Insufficient documentation

## 2017-04-27 DIAGNOSIS — M5010 Cervical disc disorder with radiculopathy, unspecified cervical region: Secondary | ICD-10-CM | POA: Diagnosis not present

## 2017-04-27 DIAGNOSIS — G35 Multiple sclerosis: Secondary | ICD-10-CM | POA: Insufficient documentation

## 2017-04-27 DIAGNOSIS — M797 Fibromyalgia: Secondary | ICD-10-CM

## 2017-05-01 DIAGNOSIS — E1140 Type 2 diabetes mellitus with diabetic neuropathy, unspecified: Secondary | ICD-10-CM | POA: Diagnosis not present

## 2017-05-01 DIAGNOSIS — Z79891 Long term (current) use of opiate analgesic: Secondary | ICD-10-CM | POA: Diagnosis not present

## 2017-05-01 DIAGNOSIS — M79606 Pain in leg, unspecified: Secondary | ICD-10-CM | POA: Diagnosis not present

## 2017-05-01 DIAGNOSIS — M797 Fibromyalgia: Secondary | ICD-10-CM | POA: Diagnosis not present

## 2017-05-01 DIAGNOSIS — G894 Chronic pain syndrome: Secondary | ICD-10-CM | POA: Diagnosis not present

## 2017-05-01 DIAGNOSIS — M545 Low back pain: Secondary | ICD-10-CM | POA: Diagnosis not present

## 2017-05-01 DIAGNOSIS — M25519 Pain in unspecified shoulder: Secondary | ICD-10-CM | POA: Diagnosis not present

## 2017-05-01 DIAGNOSIS — M542 Cervicalgia: Secondary | ICD-10-CM | POA: Diagnosis not present

## 2017-05-03 ENCOUNTER — Encounter: Payer: Self-pay | Admitting: Psychology

## 2017-05-03 NOTE — Progress Notes (Signed)
Neuropsychological Consultation   Patient:   Courtney [name]   DOB:   [birth year]  MR Number:  [id number]  Location:  [facility] [address] [place] [id number] [place] [address] Dept: [phone]           Date of Service:   04/27/2017  Start Time:   1 PM End Time:   2 PM  Provider/Observer:  Ilean Skill, Psy.D.       Clinical Neuropsychologist       Billing Code/Service: [phone] 4 Units  Chief Complaint:    The patient was referred by Dr. Criss Rosales due to issues with chronic pain.  The patient was diagnosed with MS and fibromyalgia in 2006 she reports that she has been deteriorating since that time.  The patient reports that she has more trouble walking and talking and has significant.  Changes in expressive language functioning and also has episodes of falling.  The patient reports that the pain keeps getting worse.  [name] husband reports that there have been increasing levels of pain over the past several years.  The patient reports that she has had significant issues with depression due to all of her medical issues are out of her control.  The patient feels like she constantly goes from doctors to doctors regarding her difficulties.  She has had a number of attempts to try to alleviate her pain including trigger point injections.  She has been on numerous medications for pain as well as antidepressants.  She is also participated in physical therapy and occupational therapy her husband joined the gym.  Patient reports that she is very depressed about her current difficulties.  Reason for Service:  The patient was referred for psychological/neuropsychological consultation due to coping issues around chronic pain and fibromyalgia as well as depression with anxiety.  Behavioral Observation: Courtney [name]  presents as a 62 y.o.-year-old Right African American  Female who appeared her stated age. her dress was Appropriate and she was Well Groomed and her manners were Appropriate to the situation.  her participation was indicative of Appropriate behaviors.  There were any physical disabilities noted.  she displayed an appropriate level of cooperation and motivation.     Interactions:    Active Appropriate  Attention:   within normal limits and attention span and concentration were age appropriate  Memory:   within normal limits; recent and remote memory intact  Visuo-spatial:  within normal limits  Speech (Volume):  low  Speech:   normal; normal  Thought Process:  Coherent and Relevant  Though Content:  WNL; not suicidal and not homicidal  Orientation:   person, place, time/date and situation  Judgment:   Good  Planning:   Good  Affect:    Anxious and Depressed  Mood:    Dysphoric  Insight:   Good  Intelligence:   normal  Marital Status/Living: The patient is married and currently lives with her husband who is quite supportive of her.  Current Employment: The patient is disabled.  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  Substance Use:  No concerns of substance abuse are reported.    Education:   HS Graduate  Medical History:   Past Medical History:  Diagnosis Date  . Abnormal ECG 07/18/2015  . Arthritis   . Asthma   . Bowel obstruction (  [place])   . Depression with anxiety 02/09/2015   mild xanax prn  . Diabetes mellitus ([place]) 07/18/2015   type 2  . Diverticulitis   . Dysrhythmia    abnormal ekgs worked up  . Essential tremor 03/31/2016  . Fibromyalgia   . Gait difficulty 02/09/2015  . Gallstones   . GERD (gastroesophageal reflux disease)   . HLD (hyperlipidemia)   . Hypersomnia 11/09/2015  . Hypertension    no meds  . Hyperthyroidism   . Hypothyroidism   . IBS (irritable bowel syndrome)   . Insomnia 02/09/2015  . Migraines   . MS  (multiple sclerosis) ([place])   . Multiple joint pain 03/31/2016  . Numbness in both hands 03/18/2016  . Other fatigue 02/09/2015  . Peptic ulcer   . Pneumonia   . PONV (postoperative nausea and vomiting)   . Urinary hesitancy 02/09/2015  . Vision abnormalities          Family Med/Psych History:  Family History  Problem Relation Age of Onset  . Heart disease Mother   . Kidney disease Mother   . Diabetes Mother   . Multiple sclerosis Daughter   . Multiple sclerosis Other   . Diabetes Sister   . Alcohol abuse Brother        ETOH and marijuana  . Diabetes Brother   . Diabetes Sister   . Diabetes Brother     Risk of Suicide/Violence: low the patient denies any suicidal or homicidal ideation.  Impression/DX:  The patient was referred by Dr. Criss Rosales due to issues with chronic pain.  The patient was diagnosed with MS and fibromyalgia in 2006 she reports that she has been deteriorating since that time.  The patient reports that she has more trouble walking and talking and has significant.  Changes in expressive language functioning and also has episodes of falling.  The patient reports that the pain keeps getting worse.  [name] husband reports that there have been increasing levels of pain over the past several years.  The patient reports that she has had significant issues with depression due to all of her medical issues are out of her control.  The patient feels like she constantly goes from doctors to doctors regarding her difficulties.  She has had a number of attempts to try to alleviate her pain including trigger point injections.  She has been on numerous medications for pain as well as antidepressants.  She is also participated in physical therapy and occupational therapy her husband joined the gym.  Patient reports that she is very depressed about her current difficulties.    Disposition/Plan:  We have set the patient up for individual therapeutic intervention around is related to  chronic pain and coping with her deteriorating physical functioning.  Diagnosis:    Cervical disc disorder with radiculopathy  Multiple sclerosis (HCC)  Fibromyalgia  Major depressive disorder, recurrent episode, moderate ([place])         Electronically Signed   _______________________ Ilean Skill, Psy.D.

## 2017-06-01 DIAGNOSIS — Z79891 Long term (current) use of opiate analgesic: Secondary | ICD-10-CM | POA: Diagnosis not present

## 2017-06-01 DIAGNOSIS — M797 Fibromyalgia: Secondary | ICD-10-CM | POA: Diagnosis not present

## 2017-06-01 DIAGNOSIS — M545 Low back pain: Secondary | ICD-10-CM | POA: Diagnosis not present

## 2017-06-01 DIAGNOSIS — M542 Cervicalgia: Secondary | ICD-10-CM | POA: Diagnosis not present

## 2017-06-01 DIAGNOSIS — E1140 Type 2 diabetes mellitus with diabetic neuropathy, unspecified: Secondary | ICD-10-CM | POA: Diagnosis not present

## 2017-06-01 DIAGNOSIS — G894 Chronic pain syndrome: Secondary | ICD-10-CM | POA: Diagnosis not present

## 2017-06-01 DIAGNOSIS — M25519 Pain in unspecified shoulder: Secondary | ICD-10-CM | POA: Diagnosis not present

## 2017-06-01 DIAGNOSIS — M79606 Pain in leg, unspecified: Secondary | ICD-10-CM | POA: Diagnosis not present

## 2017-06-02 ENCOUNTER — Encounter: Payer: Self-pay | Admitting: Neurology

## 2017-06-05 DIAGNOSIS — F338 Other recurrent depressive disorders: Secondary | ICD-10-CM | POA: Diagnosis not present

## 2017-06-05 DIAGNOSIS — E118 Type 2 diabetes mellitus with unspecified complications: Secondary | ICD-10-CM | POA: Diagnosis not present

## 2017-06-05 DIAGNOSIS — M797 Fibromyalgia: Secondary | ICD-10-CM | POA: Diagnosis not present

## 2017-06-05 DIAGNOSIS — E119 Type 2 diabetes mellitus without complications: Secondary | ICD-10-CM | POA: Diagnosis not present

## 2017-06-05 DIAGNOSIS — I10 Essential (primary) hypertension: Secondary | ICD-10-CM | POA: Diagnosis not present

## 2017-06-08 ENCOUNTER — Other Ambulatory Visit: Payer: Self-pay | Admitting: Family Medicine

## 2017-06-08 DIAGNOSIS — N83201 Unspecified ovarian cyst, right side: Secondary | ICD-10-CM

## 2017-06-11 ENCOUNTER — Ambulatory Visit
Admission: RE | Admit: 2017-06-11 | Discharge: 2017-06-11 | Disposition: A | Discharge status: Home / Self Care | Payer: Medicare Other | Source: Ambulatory Visit | Attending: Family Medicine | Admitting: Family Medicine

## 2017-06-11 DIAGNOSIS — N83201 Unspecified ovarian cyst, right side: Secondary | ICD-10-CM

## 2017-06-11 DIAGNOSIS — N838 Other noninflammatory disorders of ovary, fallopian tube and broad ligament: Secondary | ICD-10-CM | POA: Diagnosis not present

## 2017-06-17 ENCOUNTER — Telehealth: Payer: Self-pay | Admitting: Orthopaedic Surgery

## 2017-06-17 NOTE — Telephone Encounter (Signed)
Patient left a voicemail stating that she would like to get a referral to go to physical therapy.

## 2017-06-18 ENCOUNTER — Other Ambulatory Visit (INDEPENDENT_AMBULATORY_CARE_PROVIDER_SITE_OTHER): Payer: Self-pay | Admitting: Radiology

## 2017-06-18 DIAGNOSIS — M25512 Pain in left shoulder: Principal | ICD-10-CM

## 2017-06-18 DIAGNOSIS — G8929 Other chronic pain: Secondary | ICD-10-CM

## 2017-06-18 NOTE — Telephone Encounter (Signed)
Please advise 

## 2017-06-18 NOTE — Telephone Encounter (Signed)
Pt wanted referral for PT for [facility] for left shoulder. Sent referral to [facility] in [place] as requested by the patient

## 2017-06-18 NOTE — Telephone Encounter (Signed)
Ok-send referral. Check with pt re specific area of need

## 2017-06-22 DIAGNOSIS — M25512 Pain in left shoulder: Secondary | ICD-10-CM | POA: Diagnosis not present

## 2017-06-22 DIAGNOSIS — M6281 Muscle weakness (generalized): Secondary | ICD-10-CM | POA: Diagnosis not present

## 2017-06-29 ENCOUNTER — Encounter: Payer: Self-pay | Admitting: Neurology

## 2017-06-29 DIAGNOSIS — M542 Cervicalgia: Secondary | ICD-10-CM | POA: Diagnosis not present

## 2017-06-29 DIAGNOSIS — Z79891 Long term (current) use of opiate analgesic: Secondary | ICD-10-CM | POA: Diagnosis not present

## 2017-06-29 DIAGNOSIS — M25519 Pain in unspecified shoulder: Secondary | ICD-10-CM | POA: Diagnosis not present

## 2017-06-29 DIAGNOSIS — M79606 Pain in leg, unspecified: Secondary | ICD-10-CM | POA: Diagnosis not present

## 2017-06-29 DIAGNOSIS — M545 Low back pain: Secondary | ICD-10-CM | POA: Diagnosis not present

## 2017-06-29 DIAGNOSIS — M797 Fibromyalgia: Secondary | ICD-10-CM | POA: Diagnosis not present

## 2017-06-29 DIAGNOSIS — G894 Chronic pain syndrome: Secondary | ICD-10-CM | POA: Diagnosis not present

## 2017-06-29 DIAGNOSIS — E1140 Type 2 diabetes mellitus with diabetic neuropathy, unspecified: Secondary | ICD-10-CM | POA: Diagnosis not present

## 2017-06-30 ENCOUNTER — Encounter: Payer: Self-pay | Admitting: Psychology

## 2017-06-30 ENCOUNTER — Encounter: Payer: Medicare Other | Attending: Psychology | Admitting: Psychology

## 2017-06-30 DIAGNOSIS — M5010 Cervical disc disorder with radiculopathy, unspecified cervical region: Secondary | ICD-10-CM | POA: Diagnosis not present

## 2017-06-30 DIAGNOSIS — G8929 Other chronic pain: Secondary | ICD-10-CM | POA: Insufficient documentation

## 2017-06-30 DIAGNOSIS — G35 Multiple sclerosis: Secondary | ICD-10-CM | POA: Diagnosis not present

## 2017-06-30 DIAGNOSIS — F418 Other specified anxiety disorders: Secondary | ICD-10-CM | POA: Diagnosis not present

## 2017-06-30 DIAGNOSIS — F331 Major depressive disorder, recurrent, moderate: Secondary | ICD-10-CM | POA: Insufficient documentation

## 2017-06-30 DIAGNOSIS — R202 Paresthesia of skin: Secondary | ICD-10-CM | POA: Insufficient documentation

## 2017-06-30 DIAGNOSIS — M797 Fibromyalgia: Secondary | ICD-10-CM | POA: Insufficient documentation

## 2017-06-30 NOTE — Progress Notes (Signed)
Neuropsychological Consultation   Patient:   Courtney [name]   DOB:   [birth year]  MR Number:  [id number]  Location:  [facility] [address], [place] [id number] [place]  [id number] Dept: [phone]           Date of Service:   06/30/2017  Start Time:   9 AM End Time:   10 AM  Provider/Observer:  Ilean Skill, Psy.D.       Clinical Neuropsychologist       Billing Code/Service: [phone] 4 Units  Chief Complaint:    The patient was referred by Dr. Criss Rosales due to issues with chronic pain.  The patient was diagnosed with MS and fibromyalgia in 2006 she reports that she has been deteriorating since that time.  The patient reports that she has more trouble walking and talking and has significant.  Changes in expressive language functioning and also has episodes of falling.  The patient reports that the pain keeps getting worse.  [name] husband reports that there have been increasing levels of pain over the past several years.  The patient reports that she has had significant issues with depression due to all of her medical issues are out of her control.  The patient feels like she constantly goes from doctors to doctors regarding her difficulties.  She has had a number of attempts to try to alleviate her pain including trigger point injections.  She has been on numerous medications for pain as well as antidepressants.  She is also participated in physical therapy and occupational therapy her husband joined the gym.  Patient reports that she is very depressed about her current difficulties.  The above chief complaint continues to be valid and relevant to the situation.  Reason for Service:  The patient was referred for psychological/neuropsychological consultation due to coping issues around chronic pain and fibromyalgia as well as depression with anxiety.  Behavioral  Observation: Courtney [name]  presents as a 62 y.o.-year-old Right African American Female who appeared her stated age. her dress was Appropriate and she was Well Groomed and her manners were Appropriate to the situation.  her participation was indicative of Appropriate behaviors.  There were any physical disabilities noted.  she displayed an appropriate level of cooperation and motivation.     Interactions:    Active Appropriate  Attention:   within normal limits and attention span and concentration were age appropriate  Memory:   within normal limits; recent and remote memory intact  Visuo-spatial:  within normal limits  Speech (Volume):  low  Speech:   normal; normal  Thought Process:  Coherent and Relevant  Though Content:  WNL; not suicidal and not homicidal  Orientation:   person, place, time/date and situation  Judgment:   Good  Planning:   Good  Affect:    Anxious and Depressed  Mood:    Dysphoric  Insight:   Good  Intelligence:   normal  Marital Status/Living: The patient is married and currently lives with her husband who is quite supportive of her.  Current Employment: The patient is disabled.  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  Substance Use:  No concerns of substance abuse are reported.    Education:   HS Graduate  Medical History:   Past Medical History:  Diagnosis Date  . Abnormal  ECG 07/18/2015  . Arthritis   . Asthma   . Bowel obstruction ([place])   . Depression with anxiety 02/09/2015   mild xanax prn  . Diabetes mellitus ([place]) 07/18/2015   type 2  . Diverticulitis   . Dysrhythmia    abnormal ekgs worked up  . Essential tremor 03/31/2016  . Fibromyalgia   . Gait difficulty 02/09/2015  . Gallstones   . GERD (gastroesophageal reflux disease)   . HLD (hyperlipidemia)   . Hypersomnia 11/09/2015  . Hypertension    no meds  . Hyperthyroidism   .  Hypothyroidism   . IBS (irritable bowel syndrome)   . Insomnia 02/09/2015  . Migraines   . MS (multiple sclerosis) ([place])   . Multiple joint pain 03/31/2016  . Numbness in both hands 03/18/2016  . Other fatigue 02/09/2015  . Peptic ulcer   . Pneumonia   . PONV (postoperative nausea and vomiting)   . Urinary hesitancy 02/09/2015  . Vision abnormalities          Family Med/Psych History:  Family History  Problem Relation Age of Onset  . Heart disease Mother   . Kidney disease Mother   . Diabetes Mother   . Multiple sclerosis Daughter   . Multiple sclerosis Other   . Diabetes Sister   . Alcohol abuse Brother        ETOH and marijuana  . Diabetes Brother   . Diabetes Sister   . Diabetes Brother     Risk of Suicide/Violence: low the patient denies any suicidal or homicidal ideation.  Impression/DX:  The patient was referred by Dr. Criss Rosales due to issues with chronic pain.  The patient was diagnosed with MS and fibromyalgia in 2006 she reports that she has been deteriorating since that time.  The patient reports that she has more trouble walking and talking and has significant.  Changes in expressive language functioning and also has episodes of falling.  The patient reports that the pain keeps getting worse.  [name] husband reports that there have been increasing levels of pain over the past several years.  The patient reports that she has had significant issues with depression due to all of her medical issues are out of her control.  The patient feels like she constantly goes from doctors to doctors regarding her difficulties.  She has had a number of attempts to try to alleviate her pain including trigger point injections.  She has been on numerous medications for pain as well as antidepressants.  She is also participated in physical therapy and occupational therapy her husband joined the gym.  Patient reports that she is very depressed about her current  difficulties.    Disposition/Plan:  Today we continued to work on coping skills and strategies around the patient's issues related to her residual effects of MS as well as her fibromyalgia and depression symptoms.  The patient has been working on these issues going forward and we continue to develop coping skills and strategies.  Diagnosis:    Multiple sclerosis (HCC)  Cervical disc disorder with radiculopathy  Fibromyalgia  Major depressive disorder, recurrent episode, moderate (HCC)  Paresthesia of skin         Electronically Signed   _______________________ Ilean Skill, Psy.D.

## 2017-07-07 ENCOUNTER — Encounter (INDEPENDENT_AMBULATORY_CARE_PROVIDER_SITE_OTHER): Payer: Self-pay | Admitting: Orthopaedic Surgery

## 2017-07-07 ENCOUNTER — Ambulatory Visit (INDEPENDENT_AMBULATORY_CARE_PROVIDER_SITE_OTHER): Payer: Medicare Other

## 2017-07-07 ENCOUNTER — Ambulatory Visit (INDEPENDENT_AMBULATORY_CARE_PROVIDER_SITE_OTHER): Payer: Medicare Other | Admitting: Orthopaedic Surgery

## 2017-07-07 VITALS — BP 118/83 | HR 71 | Resp 16 | Ht 63.0 in | Wt 162.0 lb

## 2017-07-07 DIAGNOSIS — M25561 Pain in right knee: Secondary | ICD-10-CM | POA: Diagnosis not present

## 2017-07-07 NOTE — Progress Notes (Signed)
Office Visit Note   Patient: Courtney [name]           Date of Birth: 1955/06/27           MRN: [id number] Visit Date: 07/07/2017              Requested by: Lucianne Lei, MD [address] [place], [place] [address] PCP: Lucianne Lei, MD   Assessment & Plan: Visit Diagnoses:  1. Acute pain of right knee     Plan: Mild osteoarthritis by plain film.  No evidence of acute change.  Long discussion with Courtney [name] regarding the above.  Think she will be fine with her present medicines.  Just reassured her I think there are no acute problems with her knee.  The small knot at the inferior pole of the patella is asymptomatic and appears to be an osteophyte  Follow-Up Instructions: Return if symptoms worsen or fail to improve.   Orders:  Orders Placed This Encounter  Procedures  . XR KNEE 3 VIEW RIGHT   No orders of the defined types were placed in this encounter.     Procedures: No procedures performed   Clinical Data: No additional findings.   Subjective: Chief Complaint  Patient presents with  . Right Knee - Pain  . New Patient (Initial Visit)    r knee pain, noticed a week ago a knot on her knee, thats getting bigger. still having some shoulder pain. pt states she fell 2 weeks ago on bed  Noticed a small knot at the inferior pole of the right patella that appears to have grown in size over the last several weeks.  No injury or trauma.  Occasional swelling.  Multiple comorbidities including MS, insulin-dependent diabetes mellitus.  Fibromyalgia, history of depression with anxiety.  On a host of medicines including oxycodone and muscle relaxants  HPI  Review of Systems  Constitutional: Positive for fatigue. Negative for fever.  HENT: Positive for ear pain.   Eyes: Negative for pain.  Respiratory: Positive for cough. Negative for shortness of breath.   Cardiovascular: Negative for leg swelling.  Gastrointestinal: Positive for constipation. Negative for  diarrhea.  Genitourinary: Negative for difficulty urinating.  Musculoskeletal: Positive for back pain and neck pain.  Skin: Negative for rash.  Allergic/Immunologic: Negative for food allergies.  Neurological: Positive for weakness and numbness.  Hematological: Bruises/bleeds easily.  Psychiatric/Behavioral: Positive for suicidal ideas.     Objective: Vital Signs: BP 118/83 (BP Location: Left Arm, Patient Position: Sitting, Cuff Size: Normal)   Pulse 71   Resp 16   Ht 5\' 3"  (1.6 m)   Wt 162 lb (73.5 kg)   BMI 28.70 kg/m    Ortho Exam awake somewhat lethargic speech is slow.  Possibly related to all of her medicines.  No shortness of breath or chest pain..  Pupils equally round and reactive to light.  Skin is normal. Right knee with a small prominence of the very inferior pole of the patella consistent with an osteophyte.  No effusion.  Knee was not hot red warm or swollen.  No instability.  Full extension.  Flexion over 105 degrees.  No popliteal pain.  No calf pain.  Straight leg raise negative.  Painless range of motion both hips.  Specialty Comments:  No specialty comments available.  Imaging: Xr Knee 3 View Right  Result Date: 07/07/2017 Of the right knee repeat in 3 projections standing.  There appears to be some mild decrease in the  medial joint space.  No ectopic calcification.  No acute changes.  Patella appears to track in the midline.  Mild osteophyte formation at the lateral patellofemoral articulation.  Films consistent with mild osteoarthritis without acute changes    PMFS History: Patient Active Problem List   Diagnosis Date Noted  . Loss of weight   . RLQ abdominal pain 03/27/2017  . Bloating 03/27/2017  . Abnormal finding on imaging 03/27/2017  . Dilation of biliary tract 03/27/2017  . Nausea 03/27/2017  . Multiple joint pain 03/31/2016  . Essential tremor 03/31/2016  . Numbness in both hands 03/18/2016  . Hypersomnia 11/09/2015  . Diabetes mellitus ([place])  07/18/2015  . Essential hypertension 07/18/2015  . Abnormal ECG 07/18/2015  . Gait difficulty 02/09/2015  . Fibromyalgia 02/09/2015  . Depression with anxiety 02/09/2015  . Numbness 02/09/2015  . Insomnia 02/09/2015  . Urinary hesitancy 02/09/2015  . Other fatigue 02/09/2015   Past Medical History:  Diagnosis Date  . Abnormal ECG 07/18/2015  . Arthritis   . Asthma   . Bowel obstruction ([place])   . Depression with anxiety 02/09/2015   mild xanax prn  . Diabetes mellitus ([place]) 07/18/2015   type 2  . Diverticulitis   . Dysrhythmia    abnormal ekgs worked up  . Essential tremor 03/31/2016  . Fibromyalgia   . Gait difficulty 02/09/2015  . Gallstones   . GERD (gastroesophageal reflux disease)   . HLD (hyperlipidemia)   . Hypersomnia 11/09/2015  . Hypertension    no meds  . Hyperthyroidism   . Hypothyroidism   . IBS (irritable bowel syndrome)   . Insomnia 02/09/2015  . Migraines   . MS (multiple sclerosis) ([place])   . Multiple joint pain 03/31/2016  . Numbness in both hands 03/18/2016  . Other fatigue 02/09/2015  . Peptic ulcer   . Pneumonia   . PONV (postoperative nausea and vomiting)   . Urinary hesitancy 02/09/2015  . Vision abnormalities     Family History  Problem Relation Age of Onset  . Heart disease Mother   . Kidney disease Mother   . Diabetes Mother   . Multiple sclerosis Daughter   . Multiple sclerosis Other   . Diabetes Sister   . Alcohol abuse Brother        ETOH and marijuana  . Diabetes Brother   . Diabetes Sister   . Diabetes Brother     Past Surgical History:  Procedure Laterality Date  . ABDOMINAL HYSTERECTOMY  1976   including oophorectomy, due to uterine cancer  . APPENDECTOMY  1974  . CARPAL TUNNEL RELEASE     carpal tunnel surgery  . CHOLECYSTECTOMY     2006  . COLON SURGERY     [facility]  . EUS N/A 04/09/2017   Procedure: UPPER ENDOSCOPIC ULTRASOUND (EUS) LINEAR;  Surgeon: Milus Banister, MD;  Location: [facility];  Service: Endoscopy;   Laterality: N/A;  . HERNIA REPAIR    . MENISCUS REPAIR Left   . SMALL INTESTINE SURGERY     Social History   Occupational History  . Occupation: disabled  Tobacco Use  . Smoking status: Former Smoker    Packs/day: 0.25    Years: 5.00    Pack years: 1.25    Types: Cigarettes    Last attempt to quit: 1990    Years since quitting: 29.3  . Smokeless tobacco: Never Used  Substance and Sexual Activity  . Alcohol use: No    Frequency: Never  . Drug use:  No  . Sexual activity: Yes

## 2017-07-14 ENCOUNTER — Ambulatory Visit: Payer: Self-pay | Admitting: Psychology

## 2017-07-15 ENCOUNTER — Encounter (INDEPENDENT_AMBULATORY_CARE_PROVIDER_SITE_OTHER): Payer: Self-pay | Admitting: Physical Medicine and Rehabilitation

## 2017-07-15 ENCOUNTER — Ambulatory Visit (INDEPENDENT_AMBULATORY_CARE_PROVIDER_SITE_OTHER): Payer: Medicare Other | Admitting: Physical Medicine and Rehabilitation

## 2017-07-15 VITALS — BP 142/77 | HR 73 | Temp 97.9°F

## 2017-07-15 DIAGNOSIS — M5441 Lumbago with sciatica, right side: Secondary | ICD-10-CM | POA: Diagnosis not present

## 2017-07-15 DIAGNOSIS — G35 Multiple sclerosis: Secondary | ICD-10-CM

## 2017-07-15 DIAGNOSIS — M5442 Lumbago with sciatica, left side: Secondary | ICD-10-CM

## 2017-07-15 DIAGNOSIS — G8929 Other chronic pain: Secondary | ICD-10-CM

## 2017-07-15 DIAGNOSIS — M797 Fibromyalgia: Secondary | ICD-10-CM

## 2017-07-15 DIAGNOSIS — G894 Chronic pain syndrome: Secondary | ICD-10-CM | POA: Diagnosis not present

## 2017-07-15 DIAGNOSIS — F1190 Opioid use, unspecified, uncomplicated: Secondary | ICD-10-CM

## 2017-07-15 NOTE — Progress Notes (Signed)
 .  Numeric Pain Rating Scale and Functional Assessment Average Pain 10 Pain Right Now 5 My pain is constant and aching Pain is worse with: bending and some activites Pain improves with: rest   In the last MONTH (on 0-10 scale) has pain interfered with the following?  1. General activity like being  able to carry out your everyday physical activities such as walking, climbing stairs, carrying groceries, or moving a chair?  Rating(5)  2. Relation with others like being able to carry out your usual social activities and roles such as  activities at home, at work and in your community. Rating(3)  3. Enjoyment of life such that you have  been bothered by emotional problems such as feeling anxious, depressed or irritable?  Rating(4)

## 2017-07-16 DIAGNOSIS — H2513 Age-related nuclear cataract, bilateral: Secondary | ICD-10-CM | POA: Diagnosis not present

## 2017-07-16 DIAGNOSIS — G35 Multiple sclerosis: Secondary | ICD-10-CM | POA: Diagnosis not present

## 2017-07-16 DIAGNOSIS — H50111 Monocular exotropia, right eye: Secondary | ICD-10-CM | POA: Diagnosis not present

## 2017-07-16 DIAGNOSIS — H469 Unspecified optic neuritis: Secondary | ICD-10-CM | POA: Diagnosis not present

## 2017-07-16 DIAGNOSIS — E119 Type 2 diabetes mellitus without complications: Secondary | ICD-10-CM | POA: Diagnosis not present

## 2017-07-22 DIAGNOSIS — M797 Fibromyalgia: Secondary | ICD-10-CM | POA: Diagnosis not present

## 2017-07-24 DIAGNOSIS — R350 Frequency of micturition: Secondary | ICD-10-CM | POA: Diagnosis not present

## 2017-07-24 DIAGNOSIS — R3912 Poor urinary stream: Secondary | ICD-10-CM | POA: Diagnosis not present

## 2017-07-24 DIAGNOSIS — M545 Low back pain: Secondary | ICD-10-CM | POA: Diagnosis not present

## 2017-07-28 ENCOUNTER — Ambulatory Visit: Payer: Self-pay | Admitting: Psychology

## 2017-07-30 ENCOUNTER — Encounter (INDEPENDENT_AMBULATORY_CARE_PROVIDER_SITE_OTHER): Payer: Self-pay | Admitting: Physical Medicine and Rehabilitation

## 2017-07-30 NOTE — Progress Notes (Signed)
Courtney [name] - 62 y.o. female MRN [id number]  Date of birth: 22-Jul-1955  Office Visit Note: Visit Date: 07/15/2017 PCP: Lucianne Lei, MD Referred by: Lucianne Lei, MD  Subjective: Chief Complaint  Patient presents with  . Lower Back - Pain  . Right Leg - Pain  . Left Leg - Pain   HPI: Courtney [name] is a 62 year old female who suffers from chronic pain syndrome and multiple sclerosis and fibromyalgia who comes in today for worsening severe 10 out of 10 pain in the lower back that radiates into both the right and left leg.  She is accompanied by her husband who provides some of the details.  She reports that the symptoms started a few years ago she says that nothing makes it worse and it is always there and constant.  She reports nothing makes it better.  She reports constant aching pain can be somewhat worse with bending and activities.  It does affect her daily living.  I have seen the patient on a couple of occasions with the last time I saw her was in 2017.  We have completed a lumbar epidural injection and a cervical injection.  She reports those helped greatly but our notes from other physicians and notes when we have seen her stated they really did not help for very long.  These were basically diagnostic type injections as at least in the lumbar spine with an MRI from 2 years ago she has a fairly normal lumbar spine.  She has some mild to moderate arthritis at L4-5 but no nerve compression no stenosis no disc herniations.  She currently is on chronic opioid medications including OxyContin and oxycodone.  The OxyContin is 40 mg 12 hours.  The oxycodone is 20 mg for breakthrough.  She also takes Xanax.  This combination of medicines is fairly risky and she is managed accordingly.  She does have a prescription for naloxone.  She has recently been seeing Dr. Sima Matas for counseling.  She does follow with her neurologist in terms of the multiple sclerosis.  She has not had focal  weakness or foot drop.  She does feel weak in general.  She is very frustrated with the amount of pain that she has had over the last many years.  She has been followed from orthopedic standpoint by both Dr. Durward Fortes and Dr. Marlou Sa in our office.   Review of Systems  Constitutional: Positive for malaise/fatigue. Negative for chills, fever and weight loss.  HENT: Negative for hearing loss and sinus pain.   Eyes: Negative for blurred vision, double vision and photophobia.  Respiratory: Negative for cough and shortness of breath.   Cardiovascular: Negative for chest pain, palpitations and leg swelling.  Gastrointestinal: Negative for abdominal pain, nausea and vomiting.  Genitourinary: Negative for flank pain.  Musculoskeletal: Positive for back pain and joint pain. Negative for myalgias.  Skin: Negative for itching and rash.  Neurological: Positive for tingling. Negative for tremors, focal weakness and weakness.  Endo/Heme/Allergies: Negative.   Psychiatric/Behavioral: Negative for depression.  All other systems reviewed and are negative.  Otherwise per HPI.  Assessment & Plan: Visit Diagnoses:  1. Chronic bilateral low back pain with bilateral sciatica   2. Chronic pain syndrome   3. Multiple sclerosis ([place])   4. Fibromyalgia   5. Chronic, continuous use of opioids     Plan: Findings:  Chronic pain syndrome with uncomplicated opioid dependence but taking high risk combination of benzodiazepine.  She has history of severe  fibromyalgia as well as multiple sclerosis.  She has had depression and some situational depression related to her medical condition.  I had a long discussion with her and her husband today that there is nothing from an interventional standpoint that in my opinion is going to help or alleviate her symptoms at this point.  Prior injections were only on 2 occasions at the most and they were done several years ago for more of a diagnostic standpoint and really at the time with  further documentation did not help greatly.  She also listed as allergies to Solu-Medrol and prednisone with anaphylaxis listed.  She may wish to seek pain management from a more comprehensive standpoint at 1 of the bigger hospital such as [facility] and runs a really good pain program that could look at interventions and medications.  Otherwise she should continue with counseling.  I tried to reassure her that her lumbar spine is actually very good and there is really no issues on that.  If her symptoms have changed recently we could update the MRI but she is had the symptoms for several years now unchanging just generalized significant pain.  If she were to get any focal weakness or any changes then updated MRI could be done of the lumbar spine.  She expressed understanding of this and she will continue her current regiment of treatment.  Greater than 50% of this visit (total duration of visit was 40 minutes) was spent in counseling and coordination of care discussing chronic pain and fibromyalgia as well as spine pathology and treatment.    Meds & Orders: No orders of the defined types were placed in this encounter.  No orders of the defined types were placed in this encounter.   Follow-up: Return if symptoms worsen or fail to improve.   Procedures: No procedures performed  No notes on file   Clinical History: MRI LUMBAR SPINE WITHOUT CONTRAST  TECHNIQUE: Multiplanar, multisequence MR imaging of the lumbar spine was performed. No intravenous contrast was administered.  COMPARISON:  None.  FINDINGS: For the purposes of this dictation, the lowest well-formed intervertebral disc space is presumed to be L5-S1.  Vertebral alignment is normal. Vertebral body heights are preserved. Intervertebral disc space heights are preserved with at most mild age-related disc desiccation noted. No vertebral marrow edema is seen. The conus medullaris is normal in signal and terminates at  L1. Paraspinal soft tissues are unremarkable.  L1-2 and L2-3: Negative.  L3-4: Mild facet hypertrophy without disc herniation or stenosis.  L4-5: Moderate facet hypertrophy results in minimal bilateral lateral recess narrowing without evidence of neural impingement. Neural foramina widely patent.  L5-S1:  Mild facet hypertrophy without disc herniation or stenosis.  IMPRESSION: 1. Mild-to-moderate lower lumbar facet arthrosis, greatest at L4-5. 2. No disc herniation or evidence of neural impingement.   Electronically Signed   By: Logan Bores M.D.   On: 04/16/2015 17:52   She reports that she quit smoking about 29 years ago. Her smoking use included cigarettes. She has a 1.25 pack-year smoking history. She has never used smokeless tobacco. No results for input(s): HGBA1C, LABURIC in the last 8760 hours.  Objective:  VS:  HT:    WT:   BMI:     BP:(!) 142/77  HR:73bpm  TEMP:97.9 F (36.6 C)(Oral)  RESP:97 % Physical Exam  Constitutional: She is oriented to person, place, and time. She appears well-developed and well-nourished. No distress.  HENT:  Head: Normocephalic and atraumatic.  Nose: Nose normal.  Mouth/Throat: Oropharynx is clear and moist.  Eyes: Pupils are equal, round, and reactive to light. Conjunctivae are normal.  Neck: Normal range of motion. Neck supple.  Cardiovascular: Regular rhythm and intact distal pulses.  Pulmonary/Chest: Effort normal. No respiratory distress.  Abdominal: She exhibits no distension. There is no guarding.  Musculoskeletal:  Patient ambulates without aid with a fairly normal gait.  She has pain on extension of the lumbar spine.  She has active tender points across the lower back and paraspinal musculature as well as greater trochanters and PSIS and even over the elbows and lateral shoulders.  She has no pain with hip rotation she has good distal strength.  Neurological: She is alert and oriented to person, place, and time. She  exhibits normal muscle tone. Coordination normal.  Skin: Skin is warm. No rash noted. No erythema.  Psychiatric: She has a normal mood and affect. Her behavior is normal.  Nursing note and vitals reviewed.   Ortho Exam Imaging: No results found.  Past Medical/Family/Surgical/Social History: Medications & Allergies reviewed per EMR, new medications updated. Patient Active Problem List   Diagnosis Date Noted  . Loss of weight   . RLQ abdominal pain 03/27/2017  . Bloating 03/27/2017  . Abnormal finding on imaging 03/27/2017  . Dilation of biliary tract 03/27/2017  . Nausea 03/27/2017  . Multiple joint pain 03/31/2016  . Essential tremor 03/31/2016  . Numbness in both hands 03/18/2016  . Hypersomnia 11/09/2015  . Diabetes mellitus ([place]) 07/18/2015  . Essential hypertension 07/18/2015  . Abnormal ECG 07/18/2015  . Gait difficulty 02/09/2015  . Fibromyalgia 02/09/2015  . Depression with anxiety 02/09/2015  . Numbness 02/09/2015  . Insomnia 02/09/2015  . Urinary hesitancy 02/09/2015  . Other fatigue 02/09/2015   Past Medical History:  Diagnosis Date  . Abnormal ECG 07/18/2015  . Arthritis   . Asthma   . Bowel obstruction ([place])   . Depression with anxiety 02/09/2015   mild xanax prn  . Diabetes mellitus ([place]) 07/18/2015   type 2  . Diverticulitis   . Dysrhythmia    abnormal ekgs worked up  . Essential tremor 03/31/2016  . Fibromyalgia   . Gait difficulty 02/09/2015  . Gallstones   . GERD (gastroesophageal reflux disease)   . HLD (hyperlipidemia)   . Hypersomnia 11/09/2015  . Hypertension    no meds  . Hyperthyroidism   . Hypothyroidism   . IBS (irritable bowel syndrome)   . Insomnia 02/09/2015  . Migraines   . MS (multiple sclerosis) ([place])   . Multiple joint pain 03/31/2016  . Numbness in both hands 03/18/2016  . Other fatigue 02/09/2015  . Peptic ulcer   . Pneumonia   . PONV (postoperative nausea and vomiting)   . Urinary hesitancy 02/09/2015  . Vision abnormalities     Family History  Problem Relation Age of Onset  . Heart disease Mother   . Kidney disease Mother   . Diabetes Mother   . Multiple sclerosis Daughter   . Multiple sclerosis Other   . Diabetes Sister   . Alcohol abuse Brother        ETOH and marijuana  . Diabetes Brother   . Diabetes Sister   . Diabetes Brother    Past Surgical History:  Procedure Laterality Date  . ABDOMINAL HYSTERECTOMY  1976   including oophorectomy, due to uterine cancer  . APPENDECTOMY  1974  . CARPAL TUNNEL RELEASE     carpal tunnel surgery  . CHOLECYSTECTOMY  2006  . COLON SURGERY     SBO  . EUS N/A 04/09/2017   Procedure: UPPER ENDOSCOPIC ULTRASOUND (EUS) LINEAR;  Surgeon: Milus Banister, MD;  Location: [facility] ENDOSCOPY;  Service: Endoscopy;  Laterality: N/A;  . HERNIA REPAIR    . MENISCUS REPAIR Left   . SMALL INTESTINE SURGERY     Social History   Occupational History  . Occupation: disabled  Tobacco Use  . Smoking status: Former Smoker    Packs/day: 0.25    Years: 5.00    Pack years: 1.25    Types: Cigarettes    Last attempt to quit: 1990    Years since quitting: 29.4  . Smokeless tobacco: Never Used  Substance and Sexual Activity  . Alcohol use: No    Frequency: Never  . Drug use: No  . Sexual activity: Yes

## 2017-08-03 ENCOUNTER — Encounter: Payer: Self-pay | Admitting: Neurology

## 2017-08-03 ENCOUNTER — Ambulatory Visit (INDEPENDENT_AMBULATORY_CARE_PROVIDER_SITE_OTHER): Payer: Medicare Other | Admitting: Neurology

## 2017-08-03 VITALS — BP 112/78 | HR 70 | Ht 63.0 in | Wt 167.0 lb

## 2017-08-03 DIAGNOSIS — M797 Fibromyalgia: Secondary | ICD-10-CM

## 2017-08-03 DIAGNOSIS — G4710 Hypersomnia, unspecified: Secondary | ICD-10-CM | POA: Diagnosis not present

## 2017-08-03 DIAGNOSIS — F418 Other specified anxiety disorders: Secondary | ICD-10-CM

## 2017-08-03 NOTE — Progress Notes (Addendum)
NEUROLOGY CONSULTATION NOTE  Courtney [name] MRN: [id number] DOB: 17-Jan-1956  Referring provider: Pilar Jarvis, MD Primary care provider: Lucianne Lei, MD  Reason for consult:  Multiple sclerosis  HISTORY OF PRESENT ILLNESS: Courtney [name] is a 62 year old right-handed female with fibromyalgia, diabetes and prior bowel obstruction and cholecystectomy who presents to establish care for multiple sclerosis.  She is accompanied by her husband who supplements history.  Records from previous neurologist reviewed.  She began experiencing headaches, diffuse pain, numbness, tremors, intermittent body jerking and trouble with balance and gait in 2008  She was initially diagnosed with Parkinson's disease and was briefly on Mirapex.  She subsequently was diagnosed with multiple sclerosis in 2009.  Since then, she has had a gradually progressive decline with intermittent flare-ups of symptoms.  In 2013, she endorsed numbness of the entire right side of her body.  She had an MRI of the brain on 11/24/11, which showed a couple of nonspecific punctate T2/FLAIR hyperintense foci in the bilateral cerebral hemispheres.  MRI of the spinal cord did not show any cord lesions.  She was diagnosed with multiple sclerosis by Dr. Mart Piggs in [place] at that time.  She had subsequent workup.  Follow up MRIs from 2014 and 2015 were stable, including repeat imaging for worsening symptoms.  She reportedly underwent lumbar puncture in June 2015 which was reportedly normal.    Chronic and ongoing intermittent symptoms include: 1. Bilateral upper and lower extremity weakness. 2.  Right sided numbness of entire body (face, arm, torso, leg) 3.  Memory deficits 4.  "body jerking" or tremors 5.  Chronic pain.  She has a diagnosis of fibromyalgia.  She reports neck, back and radicular pain down the arms and legs.  She also endorses burning pain of the extremities.  MRI of lumbar spine from 04/16/15 to evaluate low  back and bilateral leg pain was personally reviewed and demonstrated mild-moderate facet arthrosis from L3 to S1, greatest at L4-5 with no disc herniation, central or foraminal stenosis or nerve impingement.  MRI of cervical spine revealed degenerative disc and endplate disease at L3-T3, causing spinal stenosis with cord mass effect and moderate to severe right C5 foraminal stenosis as well as bulky left anterolateral disc osteophoyte complex at C6-7 without associated spinal stenosis or nerve impingement.  Remote NCV-EMG of the lower extremities to evaluate chronic low back and extremity pain, numbness and weakness was performed on 04/10/10, which demonstrated chronic neuropathic process with reinnervation" involving the left lumbar paraspinals and bilateral lower extremities.  She is followed by pain management. 6.  Visual impairment with diplopia and light sensitivity.  Has been followed by ophthalmology.  As per notes from prior neurologist, she has not exhibited any signs of optic neuritis. 7.  Urinary incontinence.  Has been followed by urology. 8.  Fatigue.  She has treated with B12 and Provigil which have both been ineffective. 9.  Depression.  She is followed by psychology and psychiatry  She was initially started on Copaxone and then Plegridy and last Avonex until 2016, all discontinued due to injection site reactions.  Tecifera (did not tolerate).  12/22/16 LABS:  Lyme IgM negative, TSH 0.592, T4 9, B12 607, folate over 20  PAST MEDICAL HISTORY: Past Medical History:  Diagnosis Date  . Abnormal ECG 07/18/2015  . Arthritis   . Asthma   . Bowel obstruction ([place])   . Depression with anxiety 02/09/2015   mild xanax prn  . Diabetes mellitus (  [place]) 07/18/2015   type 2  . Diverticulitis   . Dysrhythmia    abnormal ekgs worked up  . Essential tremor 03/31/2016  . Fibromyalgia   . Gait difficulty 02/09/2015  . Gallstones   . GERD (gastroesophageal reflux disease)   . HLD (hyperlipidemia)   .  Hypersomnia 11/09/2015  . Hypertension    no meds  . Hyperthyroidism   . Hypothyroidism   . IBS (irritable bowel syndrome)   . Insomnia 02/09/2015  . Migraines   . MS (multiple sclerosis) ([place])   . Multiple joint pain 03/31/2016  . Numbness in both hands 03/18/2016  . Other fatigue 02/09/2015  . Peptic ulcer   . Pneumonia   . PONV (postoperative nausea and vomiting)   . Urinary hesitancy 02/09/2015  . Vision abnormalities     PAST SURGICAL HISTORY: Past Surgical History:  Procedure Laterality Date  . ABDOMINAL HYSTERECTOMY  1976   including oophorectomy, due to uterine cancer  . APPENDECTOMY  1974  . CARPAL TUNNEL RELEASE     carpal tunnel surgery  . CHOLECYSTECTOMY     2006  . COLON SURGERY     SBO  . EUS N/A 04/09/2017   Procedure: UPPER ENDOSCOPIC ULTRASOUND (EUS) LINEAR;  Surgeon: Milus Banister, MD;  Location: [facility];  Service: Endoscopy;  Laterality: N/A;  . HERNIA REPAIR    . MENISCUS REPAIR Left   . SMALL INTESTINE SURGERY      MEDICATIONS: Current Outpatient Medications on File Prior to Visit  Medication Sig Dispense Refill  . Multiple Vitamin (MULTIVITAMIN) tablet Take 1 tablet by mouth daily.    . ondansetron (ZOFRAN ODT) 4 MG disintegrating tablet Take 1 tablet (4 mg total) by mouth every 6 (six) hours as needed for nausea or vomiting. 30 tablet 1  . vitamin B-12 (CYANOCOBALAMIN) 1000 MCG tablet Take 1,000 mcg by mouth daily.    Marland Kitchen albuterol (PROVENTIL HFA;VENTOLIN HFA) 108 (90 Base) MCG/ACT inhaler Inhale 2 puffs into the lungs every 6 (six) hours as needed for wheezing or shortness of breath. 3 Inhaler 3  . alprazolam (XANAX) 2 MG tablet Take 2 mg by mouth 3 (three) times daily as needed for anxiety.     . baclofen (LIORESAL) 10 MG tablet Take 1 to 3 pills at night (Patient not taking: Reported on 07/07/2017) 90 each 5  . Biotin 1000 MCG tablet Take 1,000 mcg by mouth daily.    . butalbital-acetaminophen-caffeine (FIORICET, ESGIC) 50-325-40 MG tablet Take 1  tablet by mouth 2 (two) times daily as needed for headache.     . gabapentin (NEURONTIN) 300 MG capsule Take 300 mg by mouth as needed (pain).     . insulin aspart protamine- aspart (NOVOLOG MIX 70/30) (70-30) 100 UNIT/ML injection 35 units with breakfast, and 25 units with supper (Patient taking differently: 14 units with breakfast, and 22 units with supper) 20 mL 1  . lidocaine (XYLOCAINE) 5 % ointment Apply 1 application topically daily as needed for mild pain.    . methocarbamol (ROBAXIN) 500 MG tablet Take 1 or 2 po Q 6hrs for muscle pain (Patient not taking: Reported on 07/07/2017) 60 tablet 0  . naloxone (NARCAN) nasal spray 4 mg/0.1 mL Place 1 spray into the nose daily as needed (overdose).    Marland Kitchen oxyCODONE (ROXICODONE) 15 MG immediate release tablet Take 1 tablet by mouth 5 times daily as needed for pain  0  . Oxycodone HCl 20 MG TABS Take ONE tablet(s) by [place]  AS NEEDED FOR pain  0  . OXYCONTIN 40 MG 12 hr tablet TAKE ONE TABLET BY MOUTH EVERY TWELVE HOURS AS NEEDED FOR PAIN  0  . pantoprazole (PROTONIX) 40 MG tablet Take 40 mg by mouth daily.     No current facility-administered medications on file prior to visit.     ALLERGIES: Allergies  Allergen Reactions  . Ibuprofen Other (See Comments)    Developed a ulcer   . Prednisone Anaphylaxis, Shortness Of Breath and Palpitations  . Solu-Medrol [Methylprednisolone] Anaphylaxis, Shortness Of Breath and Palpitations  . Bactrim [Sulfamethoxazole-Trimethoprim] Other (See Comments)    "blotchiness" redness, face swelling  . Biaxin [Clarithromycin] Swelling  . Naproxen Other (See Comments)    Developed a ulcer   . Sulfa Antibiotics Other (See Comments)    Other reaction(s): Angioedema    FAMILY HISTORY: Family History  Problem Relation Age of Onset  . Heart disease Mother   . Kidney disease Mother   . Diabetes Mother   . Other Father 45       murdered  . Multiple sclerosis Daughter   . Multiple sclerosis Other     . Diabetes Sister   . Alcohol abuse Brother        ETOH and marijuana  . Diabetes Brother   . Diabetes Sister   . Diabetes Brother   . ALS Brother     SOCIAL HISTORY: Social History   Socioeconomic History  . Marital status: Married    Spouse name: Jimmie  . Number of children: 4  . Years of education: Not on file  . Highest education level: 12th grade  Occupational History  . Occupation: disabled  Social Needs  . Financial resource strain: Not on file  . Food insecurity:    Worry: Not on file    Inability: Not on file  . Transportation needs:    Medical: Not on file    Non-medical: Not on file  Tobacco Use  . Smoking status: Former Smoker    Packs/day: 0.25    Years: 5.00    Pack years: 1.25    Types: Cigarettes    Last attempt to quit: 1990    Years since quitting: 29.4  . Smokeless tobacco: Never Used  Substance and Sexual Activity  . Alcohol use: No    Frequency: Never  . Drug use: No  . Sexual activity: Yes  Lifestyle  . Physical activity:    Days per week: Not on file    Minutes per session: Not on file  . Stress: Not on file  Relationships  . Social connections:    Talks on phone: Not on file    Gets together: Not on file    Attends religious service: Not on file    Active member of club or organization: Not on file    Attends meetings of clubs or organizations: Not on file    Relationship status: Not on file  . Intimate partner violence:    Fear of current or ex partner: Not on file    Emotionally abused: Not on file    Physically abused: Not on file    Forced sexual activity: Not on file  Other Topics Concern  . Not on file  Social History Narrative   Pt is right-handed. She lives with her husband in a 2 story house. Pt avoids caffeine. She and her husband go to the gym, lately once weekly, previously 3 times a week.     REVIEW OF SYSTEMS: Constitutional:  fatigue Eyes: No visual changes, double vision, eye pain Ear, nose and throat: No  hearing loss, ear pain, nasal congestion, sore throat Cardiovascular: No chest pain, palpitations Respiratory:  No shortness of breath at rest or with exertion, wheezes GastrointestinaI: No nausea, vomiting, diarrhea, abdominal pain, fecal incontinence Genitourinary:  Urinary incontinece Musculoskeletal:  Diffuse pain Integumentary: No rash, pruritus, skin lesions Neurological: as above Psychiatric: depression, anxiety Endocrine: No palpitations, diaphoresis, change in weight, increased thirst Hematologic/Lymphatic:  No purpura, petechiae. Allergic/Immunologic: no itchy/runny eyes, nasal congestion, recent allergic reactions, rashes  PHYSICAL EXAM: Vitals:   08/03/17 0908  BP: 112/78  Pulse: 70  SpO2: 97%   General: No acute distress.  Patient appears well-groomed.  Head:  Normocephalic/atraumatic Eyes:  fundi examined but not visualized Neck: supple, no paraspinal tenderness, full range of motion Back: No paraspinal tenderness Heart: regular rate and rhythm Lungs: Clear to auscultation bilaterally. Vascular: No carotid bruits. Neurological Exam: Mental status: alert and oriented to person, place, and time, recent and remote memory intact, fund of knowledge intact, attention and concentration intact, speech fluent and not dysarthric, language intact. Cranial nerves: CN I: not tested CN II: pupils equal, round and reactive to light, visual fields intact CN III, IV, VI:  full range of motion, no nystagmus, no ptosis CN V: facial sensation intact CN VII: upper and lower face symmetric CN VIII: hearing intact CN IX, X: gag intact, uvula midline CN XI: sternocleidomastoid and trapezius muscles intact CN XII: tongue midline Bulk & Tone: normal, no fasciculations. Motor:  5/5 throughout with reduced effort.  Non-rhythmic tremor of the right hand noted, inconsistent. Sensation:  Pinprick sensation reduced over entire body.  Decreased vibration sensation in extremities. Deep Tendon  Reflexes:  2+ throughout, toes downgoing. Finger to nose testing:  With dysmetria bilaterally Heel to shin:  Without dysmetria.  Gait:  Unsteady gait, able to turn.  Romberg with sway  IMPRESSION: I do not think she has multiple sclerosis.  MRI imaging of the brain from 2018 was personally reviewed and looked unremarkable except for minimal punctate hyperintense foci which are nonspecific and may be age-related or associated with history of diabetes or history of headaches.  MRI of cervical spine demonstrate no lesions.  She reports prior CSF analysis was normal.  Ophthalmologic exams have not demonstrated evidence of optic neuritis.  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  Her tremor appears psychogenic as well.  PLAN: Management should be focused on treatment for pain and for depression.    Thank you for allowing me to take part in the care of this patient.  Metta Clines, DO  CC:  Lucianne Lei, MD  Pilar Jarvis, MD

## 2017-08-03 NOTE — Patient Instructions (Signed)
I do not think you have multiple sclerosis.  I think your symptoms are probably related to your history of depression and chronic pain.  I would continue to focus treatment with psychiatry and pain specialist.

## 2017-08-04 ENCOUNTER — Encounter: Payer: Self-pay | Admitting: Gastroenterology

## 2017-08-05 DIAGNOSIS — M79606 Pain in leg, unspecified: Secondary | ICD-10-CM | POA: Diagnosis not present

## 2017-08-05 DIAGNOSIS — E1140 Type 2 diabetes mellitus with diabetic neuropathy, unspecified: Secondary | ICD-10-CM | POA: Diagnosis not present

## 2017-08-05 DIAGNOSIS — M545 Low back pain: Secondary | ICD-10-CM | POA: Diagnosis not present

## 2017-08-05 DIAGNOSIS — M542 Cervicalgia: Secondary | ICD-10-CM | POA: Diagnosis not present

## 2017-08-05 DIAGNOSIS — Z79891 Long term (current) use of opiate analgesic: Secondary | ICD-10-CM | POA: Diagnosis not present

## 2017-08-05 DIAGNOSIS — G894 Chronic pain syndrome: Secondary | ICD-10-CM | POA: Diagnosis not present

## 2017-08-05 DIAGNOSIS — M25519 Pain in unspecified shoulder: Secondary | ICD-10-CM | POA: Diagnosis not present

## 2017-08-05 DIAGNOSIS — M797 Fibromyalgia: Secondary | ICD-10-CM | POA: Diagnosis not present

## 2017-08-11 ENCOUNTER — Telehealth: Payer: Self-pay | Admitting: Gastroenterology

## 2017-08-11 ENCOUNTER — Ambulatory Visit: Payer: Self-pay | Admitting: Physician Assistant

## 2017-08-11 NOTE — Telephone Encounter (Signed)
Sorry, I forgot to add pt's phone number [phone].

## 2017-08-11 NOTE — Telephone Encounter (Signed)
Pt is due for a colon in August this year. She states that she usually have abnormal EKGs so wants to know if she should have procedure at the hospital. Pls advise.

## 2017-08-11 NOTE — Telephone Encounter (Signed)
Patient asks me to call back. She is speaking with her pharmacy right now.

## 2017-08-13 NOTE — Telephone Encounter (Signed)
MS patient. She says cardiac history. No cardiac diagnosis listed. Patient doesn't know either but says she is supposed to call for her cardiac appointment in August for October. Not on oxygen. No blood thinners. Hard IV access. Please advise to scheduling. Thank you.

## 2017-08-13 NOTE — Telephone Encounter (Signed)
Please advise patient to request Cardiologist to send [place] the note after she has her visit. Ok to schedule at [facility] late August or Sep.

## 2017-08-18 ENCOUNTER — Ambulatory Visit: Payer: Self-pay | Admitting: Physician Assistant

## 2017-08-24 NOTE — Telephone Encounter (Signed)
Spoke with the patient about the plan. She will call for the cardiology appointment today. She will contact us as soon as she has the appointment.

## 2017-08-27 ENCOUNTER — Ambulatory Visit: Payer: Self-pay | Admitting: Psychology

## 2017-09-07 DIAGNOSIS — M25572 Pain in left ankle and joints of left foot: Secondary | ICD-10-CM | POA: Diagnosis not present

## 2017-09-07 DIAGNOSIS — S93402A Sprain of unspecified ligament of left ankle, initial encounter: Secondary | ICD-10-CM | POA: Diagnosis not present

## 2017-09-10 ENCOUNTER — Ambulatory Visit: Payer: Self-pay | Admitting: Psychology

## 2017-09-17 ENCOUNTER — Ambulatory Visit: Payer: Self-pay | Admitting: Neurology

## 2017-09-21 ENCOUNTER — Encounter (INDEPENDENT_AMBULATORY_CARE_PROVIDER_SITE_OTHER): Payer: Self-pay | Admitting: Orthopaedic Surgery

## 2017-09-21 ENCOUNTER — Ambulatory Visit (INDEPENDENT_AMBULATORY_CARE_PROVIDER_SITE_OTHER): Payer: Medicare Other | Admitting: Orthopaedic Surgery

## 2017-09-21 VITALS — BP 168/93 | HR 66 | Ht 63.0 in | Wt 164.0 lb

## 2017-09-21 DIAGNOSIS — M79672 Pain in left foot: Secondary | ICD-10-CM | POA: Diagnosis not present

## 2017-09-21 NOTE — Progress Notes (Signed)
Office Visit Note   Patient: Courtney [name]           Date of Birth: 1955-03-28           MRN: [id number] Visit Date: 09/21/2017              Requested by: Lucianne Lei, MD [address] [place], [place] [address] PCP: Lucianne Lei, MD   Assessment & Plan: Visit Diagnoses:  1. Left foot pain     Plan: Right foot and ankle injury about 3 weeks ago.  Was seen at a local medical center with x-rays.  Films were negative for any acute changes.  Still having quite a bit of pain but no skin changes.  Using an Ace wrap.  No significant problem by my exam today.  We will try an ankle support and reevaluate in 1 month.  Consider MRI scan if still symptomatic  Follow-Up Instructions: Return in about 1 month (around 10/22/2017).   Orders:  No orders of the defined types were placed in this encounter.  No orders of the defined types were placed in this encounter.     Procedures: No procedures performed   Clinical Data: No additional findings.   Subjective: Chief Complaint  Patient presents with  . Follow-up    L ANKLE PAIN TWISTED IT AROUND JULY 4TH STILL HAS SWELLING NAD PAIN AND GETTING WORSE  Twisted left ankle and foot 4 July while visiting in [place].  Was seen at a local [facility] with films not demonstrating an acute injury.  I reviewed them on the disc and agree.  Ankle mortise was intact.  There is a large posterior heel spur and a small plantar heel spur which are not symptomatic.  Still wearing an Ace bandage for support and having persistent pain.  HPI  Review of Systems  Constitutional: Positive for fatigue. Negative for fever.  HENT: Negative for ear pain.   Eyes: Negative for pain.  Respiratory: Negative for cough and shortness of breath.   Cardiovascular: Negative for leg swelling.  Gastrointestinal: Positive for constipation. Negative for diarrhea.  Genitourinary: Negative for difficulty urinating.  Musculoskeletal: Positive for back pain and  neck pain.  Skin: Negative for rash.  Allergic/Immunologic: Negative for food allergies.  Neurological: Positive for weakness and numbness.  Hematological: Does not bruise/bleed easily.  Psychiatric/Behavioral: Positive for sleep disturbance.     Objective: Vital Signs: BP (!) 168/93 (BP Location: Right Arm, Patient Position: Sitting, Cuff Size: Normal)   Pulse 66   Ht 5\' 3"  (1.6 m)   Wt 164 lb (74.4 kg)   BMI 29.05 kg/m   Physical Exam  Constitutional: She is oriented to person, place, and time. She appears well-developed and well-nourished.  HENT:  Mouth/Throat: Oropharynx is clear and moist.  Eyes: Pupils are equal, round, and reactive to light. EOM are normal.  Pulmonary/Chest: Effort normal.  Neurological: She is alert and oriented to person, place, and time.  Skin: Skin is warm and dry.  Psychiatric: She has a normal mood and affect. Her behavior is normal.    Ortho Exam awake alert and oriented x3.  Comfortable sitting.  Ace wrap about left foot and ankle was removed.  Skin intact without ecchymosis or erythema.  No obvious swelling.  Multiple areas of tenderness about both malleoli and dorsum of foot.  Motor exam intact.  No significant tenderness over the peroneal tendons or flexor tendons behind the medial malleolus.  No pain about the Achilles  tendon.  No mass formation.  Specialty Comments:  No specialty comments available.  Imaging: No results found.   PMFS History: Patient Active Problem List   Diagnosis Date Noted  . Loss of weight   . RLQ abdominal pain 03/27/2017  . Bloating 03/27/2017  . Abnormal finding on imaging 03/27/2017  . Dilation of biliary tract 03/27/2017  . Nausea 03/27/2017  . Multiple joint pain 03/31/2016  . Essential tremor 03/31/2016  . Numbness in both hands 03/18/2016  . Hypersomnia 11/09/2015  . Diabetes mellitus ([place]) 07/18/2015  . Essential hypertension 07/18/2015  . Abnormal ECG 07/18/2015  . Gait difficulty 02/09/2015  .  Fibromyalgia 02/09/2015  . Depression with anxiety 02/09/2015  . Numbness 02/09/2015  . Insomnia 02/09/2015  . Urinary hesitancy 02/09/2015  . Other fatigue 02/09/2015   Past Medical History:  Diagnosis Date  . Abnormal ECG 07/18/2015  . Arthritis   . Asthma   . Bowel obstruction ([place])   . Depression with anxiety 02/09/2015   mild xanax prn  . Diabetes mellitus ([place]) 07/18/2015   type 2  . Diverticulitis   . Dysrhythmia    abnormal ekgs worked up  . Essential tremor 03/31/2016  . Fibromyalgia   . Gait difficulty 02/09/2015  . Gallstones   . GERD (gastroesophageal reflux disease)   . HLD (hyperlipidemia)   . Hypersomnia 11/09/2015  . Hypertension    no meds  . Hyperthyroidism   . Hypothyroidism   . IBS (irritable bowel syndrome)   . Insomnia 02/09/2015  . Migraines   . MS (multiple sclerosis) ([place])   . Multiple joint pain 03/31/2016  . Numbness in both hands 03/18/2016  . Other fatigue 02/09/2015  . Peptic ulcer   . Pneumonia   . PONV (postoperative nausea and vomiting)   . Urinary hesitancy 02/09/2015  . Vision abnormalities     Family History  Problem Relation Age of Onset  . Heart disease Mother   . Kidney disease Mother   . Diabetes Mother   . Other Father 70       murdered  . Multiple sclerosis Daughter   . Multiple sclerosis Other   . Diabetes Sister   . Alcohol abuse Brother        ETOH and marijuana  . Diabetes Brother   . Diabetes Sister   . Diabetes Brother   . ALS Brother     Past Surgical History:  Procedure Laterality Date  . ABDOMINAL HYSTERECTOMY  1976   including oophorectomy, due to uterine cancer  . APPENDECTOMY  1974  . CARPAL TUNNEL RELEASE     carpal tunnel surgery  . CHOLECYSTECTOMY     2006  . COLON SURGERY     SBO  . EUS N/A 04/09/2017   Procedure: UPPER ENDOSCOPIC ULTRASOUND (EUS) LINEAR;  Surgeon: Milus Banister, MD;  Location: [facility];  Service: Endoscopy;  Laterality: N/A;  . HERNIA REPAIR    . MENISCUS REPAIR Left   .  SMALL INTESTINE SURGERY     Social History   Occupational History  . Occupation: disabled  Tobacco Use  . Smoking status: Former Smoker    Packs/day: 0.25    Years: 5.00    Pack years: 1.25    Types: Cigarettes    Last attempt to quit: 1990    Years since quitting: 29.5  . Smokeless tobacco: Never Used  Substance and Sexual Activity  . Alcohol use: No    Frequency: Never  . Drug use: No  .  Sexual activity: Yes

## 2017-09-25 ENCOUNTER — Telehealth (INDEPENDENT_AMBULATORY_CARE_PROVIDER_SITE_OTHER): Payer: Self-pay | Admitting: Orthopaedic Surgery

## 2017-09-25 NOTE — Telephone Encounter (Signed)
Notified pt to f/u with pcp or urgent care/er if not better. Pt states she has appt with PCP next week and will got to er or urgent care if feet get worse.

## 2017-09-25 NOTE — Telephone Encounter (Signed)
Patient was in office on Monday with swollen lt foot, now right foot is also swollen. Patient wants to know what she should do. Per patient feet very painful. Tylenol does not help. Please call to advise.

## 2017-10-02 ENCOUNTER — Telehealth (INDEPENDENT_AMBULATORY_CARE_PROVIDER_SITE_OTHER): Payer: Self-pay | Admitting: Orthopaedic Surgery

## 2017-10-02 DIAGNOSIS — G35 Multiple sclerosis: Secondary | ICD-10-CM | POA: Diagnosis not present

## 2017-10-02 DIAGNOSIS — M797 Fibromyalgia: Secondary | ICD-10-CM | POA: Diagnosis not present

## 2017-10-02 DIAGNOSIS — E118 Type 2 diabetes mellitus with unspecified complications: Secondary | ICD-10-CM | POA: Diagnosis not present

## 2017-10-02 DIAGNOSIS — I10 Essential (primary) hypertension: Secondary | ICD-10-CM | POA: Diagnosis not present

## 2017-10-02 DIAGNOSIS — Z0000 Encounter for general adult medical examination without abnormal findings: Secondary | ICD-10-CM | POA: Diagnosis not present

## 2017-10-02 NOTE — Telephone Encounter (Signed)
Patient having continued pain with her lt foot, pain shooting up leg, and swelling. Patient would like to know if she needs an MRI? Please call patient to inform.

## 2017-10-05 ENCOUNTER — Encounter

## 2017-10-05 ENCOUNTER — Other Ambulatory Visit: Payer: Self-pay | Admitting: *Deleted

## 2017-10-05 ENCOUNTER — Ambulatory Visit: Payer: Self-pay | Admitting: Neurology

## 2017-10-05 DIAGNOSIS — M545 Low back pain: Secondary | ICD-10-CM | POA: Diagnosis not present

## 2017-10-05 DIAGNOSIS — M797 Fibromyalgia: Secondary | ICD-10-CM | POA: Diagnosis not present

## 2017-10-05 DIAGNOSIS — E1140 Type 2 diabetes mellitus with diabetic neuropathy, unspecified: Secondary | ICD-10-CM | POA: Diagnosis not present

## 2017-10-05 DIAGNOSIS — M79672 Pain in left foot: Secondary | ICD-10-CM

## 2017-10-05 DIAGNOSIS — Z79891 Long term (current) use of opiate analgesic: Secondary | ICD-10-CM | POA: Diagnosis not present

## 2017-10-05 DIAGNOSIS — M542 Cervicalgia: Secondary | ICD-10-CM | POA: Diagnosis not present

## 2017-10-05 DIAGNOSIS — G894 Chronic pain syndrome: Secondary | ICD-10-CM | POA: Diagnosis not present

## 2017-10-05 DIAGNOSIS — M25519 Pain in unspecified shoulder: Secondary | ICD-10-CM | POA: Diagnosis not present

## 2017-10-05 DIAGNOSIS — M79606 Pain in leg, unspecified: Secondary | ICD-10-CM | POA: Diagnosis not present

## 2017-10-05 NOTE — Telephone Encounter (Signed)
MRI of left foot ordered, please advise if anything further needs to be done. Thank you.

## 2017-10-12 ENCOUNTER — Telehealth: Payer: Self-pay | Admitting: Gastroenterology

## 2017-10-12 ENCOUNTER — Other Ambulatory Visit: Payer: Self-pay

## 2017-10-12 NOTE — Telephone Encounter (Signed)
I spoke with the patient. She states her PCP is Dr Lucianne Lei.  Tried to call the clinic.Can only transfer to voice mails. Recordings do not identify as Dr Fransico Setters office.  Fax number obtained through the web site. I will fax a clearance request to her.

## 2017-10-12 NOTE — Telephone Encounter (Signed)
Pt called stating that she tried to make an appt with her cardiologist so she can have an ekg before her colonoscopy but they are scheduling 4 moths out, she then called her PCP requesting for an ekg and was told that we need to request ekg for pt. Pls call pt.

## 2017-10-15 ENCOUNTER — Telehealth: Payer: Self-pay | Admitting: Nurse Practitioner

## 2017-10-15 ENCOUNTER — Ambulatory Visit (INDEPENDENT_AMBULATORY_CARE_PROVIDER_SITE_OTHER): Payer: Medicare Other | Admitting: Podiatry

## 2017-10-15 ENCOUNTER — Encounter: Payer: Self-pay | Admitting: Podiatry

## 2017-10-15 VITALS — BP 169/89 | HR 76 | Wt 164.0 lb

## 2017-10-15 DIAGNOSIS — M79671 Pain in right foot: Secondary | ICD-10-CM

## 2017-10-15 DIAGNOSIS — M25472 Effusion, left ankle: Secondary | ICD-10-CM

## 2017-10-15 DIAGNOSIS — L600 Ingrowing nail: Secondary | ICD-10-CM | POA: Diagnosis not present

## 2017-10-15 DIAGNOSIS — M7662 Achilles tendinitis, left leg: Secondary | ICD-10-CM | POA: Diagnosis not present

## 2017-10-15 DIAGNOSIS — M79672 Pain in left foot: Secondary | ICD-10-CM | POA: Diagnosis not present

## 2017-10-15 DIAGNOSIS — M21962 Unspecified acquired deformity of left lower leg: Secondary | ICD-10-CM | POA: Diagnosis not present

## 2017-10-15 DIAGNOSIS — M216X2 Other acquired deformities of left foot: Secondary | ICD-10-CM | POA: Diagnosis not present

## 2017-10-15 NOTE — Progress Notes (Signed)
SUBJECTIVE: 62 y.o. year old female presents for diabetic foot care. Has not had any podiatric care for a long time. Having pain on right great toe for a week.  Stated that something happened during 4th of July on left foot. She went to ER for swollen foot while in [place].  She was diagnosed with Achilles tendonitis by Dr. Durward Fortes in [place] and was treated with a brace. She was told to schedule a MRI study. Been diabetic for 10 years. Stated that her A1c was 8.2 this month and had to have medication adjusted. Her usual blood sugar runs between 125-135.  Been diagnosed with Fibromyalgia x 10-12 years.  Review of Systems  Constitutional: Negative.   HENT: Negative.   Eyes: Negative.   Respiratory: Negative.   Cardiovascular: Negative.   Gastrointestinal: Negative.   Genitourinary: Negative.   Musculoskeletal:       Joint pain everywhere. Diagnosed with Parkinson's, MS, Fibromyalgia,  Skin: Negative.      OBJECTIVE: DERMATOLOGIC EXAMINATION: Ingrown hallucal nails both great toe at medial border, symptomatic. No drainage noted.  VASCULAR EXAMINATION OF LOWER LIMBS: All pedal pulses are palpable with normal pulsation.  Capillary Filling times within 3 seconds in all digits.  Positive of left ankle edema without increased temperature. Temperature gradient from tibial crest to dorsum of foot is within normal bilateral.  NEUROLOGIC EXAMINATION OF THE LOWER LIMBS: Achilles DTR is present and within normal. Failed to Monofilament (Semmes-Weinstein 10-gm) sensory testing 6 out of 6, bilateral. Vibratory sensations(128Hz  turning fork) faintly intact at medial and lateral forefoot bilateral.   MUSCULOSKELETAL EXAMINATION: Positive for hypermobile first ray L>R. Hyperpronation left ankle. Normal Achilles tendon flexion and extension bilateral. Edema left ankle diffuse, posterior, medial and lateral.   ASSESSMENT: Achilles tendonitis left. Metatarsal deformity bilateral, hypermobile  first ray. Hyperpronation left ankle. Edema left ankle. Pain with ambulation left ankle. Diabetic neuropathy.  PLAN: Reviewed findings and available treatment options. Affected ingrown great toe nails debrided. STEP-FREE ANKLE STABILIZER (size small) dispensed for left ankle. May return for ingrown nail surgery on right great toe medial border. Advised to go ahead and finish her MRI study of the left ankle. May benefit from Diabetic shoe to accommodate ankle brace left.

## 2017-10-15 NOTE — Telephone Encounter (Signed)
Lmtcb. Pt needs to be scheduled for pre-op cardiac clearance appt.

## 2017-10-15 NOTE — Patient Instructions (Signed)
Seen for diabetic foot care. Noted of weak first metatarsal bone and abnormal biomechanics of foot. Left ankle edema may due to faulty biomechanics, hyperpronation of rearfoot. Ankle brace dispensed. May benefit from Diabetic shoes. Return with signed PCP certification for diabetic shoes. May schedule for ingr.wn nail surgery for the right great toe medial border.

## 2017-10-15 NOTE — Telephone Encounter (Signed)
   [facility] Pre-operative Risk Assessment    Request for surgical clearance:  1. What type of surgery is being performed? Colonoscopy   2. When is this surgery scheduled?  TBD   3. What type of clearance is required (medical clearance vs. Pharmacy clearance to hold med vs. Both)?  Medical  4. Are there any medications that need to be held prior to surgery and how long? None Indicated   5. Practice name and name of physician performing surgery?  [facility], Dr. Silverio Decamp   6. What is your office phone number [phone]    7.   What is your office fax number [phone], ATTN: Blase Mess, LPN  8.   Anesthesia type (None, local, MAC, general) ? Conscious Sedation   Courtney [name] 10/15/2017, 3:39 PM  _________________________________________________________________   (provider comments below)

## 2017-10-15 NOTE — Telephone Encounter (Signed)
   [place], MD  Chart reviewed as part of pre-operative protocol coverage. Because of Courtney [name] past medical history and time since last visit, he/she will require a follow-up visit in order to better assess preoperative cardiovascular risk.  Pre-op covering staff: - Please schedule appointment and call patient to inform them. - Please contact requesting surgeon's office via preferred method (i.e, phone, fax) to inform them of need for appointment prior to surgery.  Tami Lin Safal Halderman, PA  10/15/2017, 4:19 PM

## 2017-10-16 ENCOUNTER — Telehealth: Payer: Self-pay | Admitting: *Deleted

## 2017-10-16 NOTE — Telephone Encounter (Signed)
Patient called and states she cannot wear the brace she received in the office yesterday. Patient states it causes too much pain and she is not going to wear it. Please advise

## 2017-10-19 NOTE — Telephone Encounter (Signed)
Spoke with pt re: cardiac clearance for Colonoscopy. Pt has been scheduled to see Cecilie Kicks, NP 10/22/17 @ 1:30. Pt advised to arrive at 1:15 for registration. Pt thanked me for the call.

## 2017-10-20 ENCOUNTER — Encounter: Payer: Medicare Other | Attending: Psychology | Admitting: Psychology

## 2017-10-20 DIAGNOSIS — G35 Multiple sclerosis: Secondary | ICD-10-CM

## 2017-10-20 DIAGNOSIS — K219 Gastro-esophageal reflux disease without esophagitis: Secondary | ICD-10-CM | POA: Insufficient documentation

## 2017-10-20 DIAGNOSIS — F331 Major depressive disorder, recurrent, moderate: Secondary | ICD-10-CM | POA: Diagnosis not present

## 2017-10-20 DIAGNOSIS — G894 Chronic pain syndrome: Secondary | ICD-10-CM | POA: Insufficient documentation

## 2017-10-20 DIAGNOSIS — Z8249 Family history of ischemic heart disease and other diseases of the circulatory system: Secondary | ICD-10-CM | POA: Diagnosis not present

## 2017-10-20 DIAGNOSIS — F419 Anxiety disorder, unspecified: Secondary | ICD-10-CM | POA: Insufficient documentation

## 2017-10-20 DIAGNOSIS — M797 Fibromyalgia: Secondary | ICD-10-CM | POA: Insufficient documentation

## 2017-10-20 DIAGNOSIS — I10 Essential (primary) hypertension: Secondary | ICD-10-CM | POA: Diagnosis not present

## 2017-10-20 DIAGNOSIS — E119 Type 2 diabetes mellitus without complications: Secondary | ICD-10-CM | POA: Insufficient documentation

## 2017-10-20 DIAGNOSIS — E785 Hyperlipidemia, unspecified: Secondary | ICD-10-CM | POA: Diagnosis not present

## 2017-10-20 DIAGNOSIS — Z833 Family history of diabetes mellitus: Secondary | ICD-10-CM | POA: Diagnosis not present

## 2017-10-22 ENCOUNTER — Encounter: Payer: Self-pay | Admitting: Cardiology

## 2017-10-22 ENCOUNTER — Ambulatory Visit (INDEPENDENT_AMBULATORY_CARE_PROVIDER_SITE_OTHER): Payer: Medicare Other | Admitting: Cardiology

## 2017-10-22 VITALS — BP 138/86 | HR 74 | Ht 63.0 in | Wt 164.0 lb

## 2017-10-22 DIAGNOSIS — E118 Type 2 diabetes mellitus with unspecified complications: Secondary | ICD-10-CM | POA: Diagnosis not present

## 2017-10-22 DIAGNOSIS — R0789 Other chest pain: Secondary | ICD-10-CM | POA: Diagnosis not present

## 2017-10-22 DIAGNOSIS — Z01818 Encounter for other preprocedural examination: Secondary | ICD-10-CM | POA: Diagnosis not present

## 2017-10-22 DIAGNOSIS — R9431 Abnormal electrocardiogram [ECG] [EKG]: Secondary | ICD-10-CM | POA: Diagnosis not present

## 2017-10-22 DIAGNOSIS — I10 Essential (primary) hypertension: Secondary | ICD-10-CM

## 2017-10-22 DIAGNOSIS — G35 Multiple sclerosis: Secondary | ICD-10-CM | POA: Diagnosis not present

## 2017-10-22 NOTE — Progress Notes (Signed)
Cardiology Office Note   Date:  10/22/2017   ID:  Courtney [name], DOB 11-14-1955, MRN [id number]  PCP:  Courtney Lei, MD  Cardiologist:  Dr. Irish Lack    Chief Complaint  Patient presents with  . Chest Pain    chronic  . Pre-op Exam    for colonoscopy.       History of Present Illness: Courtney [name] is a 62 y.o. female who presents for chronic chest pain--pre-op clearance .    Pt with a h/o recurrent chest pain.  SHe has been evaluated with cardiac cath in 2001 and 2006. She has had multiple stress tests most recently in 2015 which was normal.    She moved to [place] in 10/16 to find warmer weather.  She has been diagnosed with MS and wants to be in a warmer climate.    She will occasionally have a skipped heart beat.  It used to be frequent but now it is rare.  Better with exercise.    BP had been running lower so last year BP meds were stopped.  Dr. Cherlynn Kaiser stated resume meds if BP >140.90  Today she is feeling well, she spent a month in [place] and loved it, though only toes in the water due to sharks.  She has less chest pain than she has had,  Really none recently, some dyspnea with climbing steps but she does not have to stop, she vacuums, cleans house and does without chest pain or any significant SOB.  She has injured her Lt ankle and for MRI tomorrow.  She  Needs colonoscopy with hx of polyps, she has every 5 years.  She also needs ingrown toe nail surgery.   Pt has abnormal EKG and has for several years.    Past Medical History:  Diagnosis Date  . Abnormal ECG 07/18/2015  . Arthritis   . Asthma   . Bowel obstruction ([place])   . Depression with anxiety 02/09/2015   mild xanax prn  . Diabetes mellitus ([place]) 07/18/2015   type 2  . Diverticulitis   . Dysrhythmia    abnormal ekgs worked up  . Essential tremor 03/31/2016  . Fibromyalgia   . Gait difficulty 02/09/2015  . Gallstones   . GERD (gastroesophageal reflux disease)   . HLD (hyperlipidemia)   .  Hypersomnia 11/09/2015  . Hypertension    no meds  . Hyperthyroidism   . Hypothyroidism   . IBS (irritable bowel syndrome)   . Insomnia 02/09/2015  . Migraines   . MS (multiple sclerosis) ([place])   . Multiple joint pain 03/31/2016  . Numbness in both hands 03/18/2016  . Other fatigue 02/09/2015  . Peptic ulcer   . Pneumonia   . PONV (postoperative nausea and vomiting)   . Urinary hesitancy 02/09/2015  . Vision abnormalities     Past Surgical History:  Procedure Laterality Date  . ABDOMINAL HYSTERECTOMY  1976   including oophorectomy, due to uterine cancer  . APPENDECTOMY  1974  . CARPAL TUNNEL RELEASE     carpal tunnel surgery  . CHOLECYSTECTOMY     2006  . COLON SURGERY     SBO  . EUS N/A 04/09/2017   Procedure: UPPER ENDOSCOPIC ULTRASOUND (EUS) LINEAR;  Surgeon: Milus Banister, MD;  Location: [facility];  Service: Endoscopy;  Laterality: N/A;  . HERNIA REPAIR    . MENISCUS REPAIR Left   . SMALL INTESTINE SURGERY       Current Outpatient Medications  Medication Sig Dispense Refill  . albuterol (PROVENTIL HFA;VENTOLIN HFA) 108 (90 Base) MCG/ACT inhaler Inhale 2 puffs into the lungs every 6 (six) hours as needed for wheezing or shortness of breath. 3 Inhaler 3  . alprazolam (XANAX) 2 MG tablet Take 2 mg by mouth 3 (three) times daily as needed for anxiety.     . butalbital-acetaminophen-caffeine (FIORICET, ESGIC) 50-325-40 MG tablet Take 1 tablet by mouth 2 (two) times daily as needed for headache.     . insulin aspart protamine- aspart (NOVOLOG MIX 70/30) (70-30) 100 UNIT/ML injection 35 units with breakfast, and 25 units with supper (Patient taking differently: 14 units with breakfast, and 22 units with supper) 20 mL 1  . lidocaine (XYLOCAINE) 5 % ointment Apply 1 application topically daily as needed for mild pain.    . Multiple Vitamin (MULTIVITAMIN) tablet Take 1 tablet by mouth daily.    . naloxone (NARCAN) nasal spray 4 mg/0.1 mL Place 1 spray into the nose daily as  needed (overdose).    . ondansetron (ZOFRAN ODT) 4 MG disintegrating tablet Take 1 tablet (4 mg total) by mouth every 6 (six) hours as needed for nausea or vomiting. 30 tablet 1  . oxyCODONE (OXYCONTIN) 10 mg 12 hr tablet TAKE ONE TABLET (10 MG) BY MOUTH EVERY TWELVE HOURS  0  . pantoprazole (PROTONIX) 40 MG tablet Take 40 mg by mouth daily.    . vitamin B-12 (CYANOCOBALAMIN) 1000 MCG tablet Take 1,000 mcg by mouth daily.     No current facility-administered medications for this visit.     Allergies:   Ibuprofen; Prednisone; Solu-medrol [methylprednisolone]; Bactrim [sulfamethoxazole-trimethoprim]; Biaxin [clarithromycin]; Naproxen; and Sulfa antibiotics    Social History:  The patient  reports that she quit smoking about 29 years ago. Her smoking use included cigarettes. She has a 1.25 pack-year smoking history. She has never used smokeless tobacco. She reports that she does not drink alcohol or use drugs.   Family History:  [name] family history includes ALS in her brother; Alcohol abuse in her brother; Diabetes in her brother, brother, mother, sister, and sister; Heart disease in her mother; Kidney disease in her mother; Multiple sclerosis in her daughter and other; Other (age of onset: 65) in her father.    ROS:  General:no colds or fevers, no weight changes Skin:no rashes or ulcers HEENT:no blurred vision, no congestion CV:see HPI PUL:see HPI GI:no diarrhea constipation or melena, no indigestion GU:no hematuria, no dysuria MS:no joint pain, no claudication Neuro:no syncope, no lightheadedness Endo:+ diabetes and jardiance has been added, last hgb A1C 8.2 , no thyroid disease  Wt Readings from Last 3 Encounters:  10/22/17 164 lb (74.4 kg)  10/15/17 164 lb (74.4 kg)  09/21/17 164 lb (74.4 kg)     PHYSICAL EXAM: VS:  BP 138/86   Pulse 74   Ht 5\' 3"  (1.6 m)   Wt 164 lb (74.4 kg)   SpO2 96%   BMI 29.05 kg/m  , BMI Body mass index is 29.05 kg/m. General:Pleasant  affect, NAD Skin:Warm and dry, brisk capillary refill HEENT:normocephalic, sclera clear, mucus membranes moist Neck:supple, no JVD, no bruits  Heart:S1S2 RRR without murmur, gallup, rub or click Lungs:clear without rales, rhonchi, or wheezes SJG:GEZM, non tender, + BS, do not palpate liver spleen or masses Ext:no lower ext edema, except mild edema at Lt ankle from injury 2+ pedal pulses, 2+ radial pulses Neuro:alert and oriented X 3, MAE, follows commands, + facial symmetry    EKG:  EKG is  ordered today. The ekg ordered today demonstrates NSR LVH with associated ST changes of inf and lateral T wave inversion.  No change from 07/23/16.     Recent Labs: 04/01/2017: ALT 32; BUN 13; Creatinine, Ser 1.02; Hemoglobin 14.0; Platelets 151; Potassium 4.0; Sodium 140    Lipid Panel No results found for: CHOL, TRIG, HDL, CHOLHDL, VLDL, LDLCALC, LDLDIRECT     Other studies Reviewed: Additional studies/ records that were reviewed today include: . Reviewed past office visits and nuc study which was neg.   ASSESSMENT AND PLAN:  1.   Pre-op exam.  Pt is low risk for low risk procedure.  Her colonoscopy.  Prior normal cardiac caths and EKG with LVH.  No chest pain at 4 METS.   2.   Hx of chest pain improved.  Follow up with Dr. Irish Lack in 1 year unless problems before.   3.   BP had been low so BP meds stopped.  BP is still controlled.   4.   DM on insulin.    Per PCP  5.   MS per PCP  6.   HLD followed by Dr. Criss Rosales   Current medicines are reviewed with the patient today.  The patient Has no concerns regarding medicines.  The following changes have been made:  See above Labs/ tests ordered today include:see above  Disposition:   FU:  see above  Signed, Cecilie Kicks, NP  10/22/2017 2:33 PM    [facility] [place], [place], [address] [place], [place] Phone: [phone]; Fax: [phone]

## 2017-10-22 NOTE — Patient Instructions (Signed)
Medication Instructions:  1. Your physician recommends that you continue on your current medications as directed. Please refer to the Current Medication list given to you today.   Labwork: NONE ORDERED TODAY  Testing/Procedures: NONE ORDERED TODAY  Follow-Up: FOLLOW UP WITH DR. VARANASI IN 1 YEAR  Any Other Special Instructions Will Be Listed Below (If Applicable).  YOU HAVE BEEN CLEARED FOR YOUR PROCEDURE    If you need a refill on your cardiac medications before your next appointment, please call your pharmacy.

## 2017-10-23 ENCOUNTER — Ambulatory Visit
Admission: RE | Admit: 2017-10-23 | Discharge: 2017-10-23 | Disposition: A | Discharge status: Home / Self Care | Payer: Medicare Other | Source: Ambulatory Visit | Attending: Orthopaedic Surgery | Admitting: Orthopaedic Surgery

## 2017-10-23 DIAGNOSIS — M79672 Pain in left foot: Secondary | ICD-10-CM

## 2017-10-23 DIAGNOSIS — M7989 Other specified soft tissue disorders: Secondary | ICD-10-CM | POA: Diagnosis not present

## 2017-10-26 ENCOUNTER — Telehealth (INDEPENDENT_AMBULATORY_CARE_PROVIDER_SITE_OTHER): Payer: Self-pay | Admitting: Orthopaedic Surgery

## 2017-10-26 NOTE — Telephone Encounter (Signed)
PLEASE ADVISE.

## 2017-10-26 NOTE — Telephone Encounter (Signed)
Spoke with the patient. She declines the offer of 12/14/17 for her colonoscopy. She states she will need to contact me about this at a later date. She is "planning on going out of town for about a month."

## 2017-10-26 NOTE — Telephone Encounter (Addendum)
Cardiology has seen the patient and given clearance to have a colonoscopy. Left message for the patient to call back to schedule the colonoscopy to be done at the [facility].

## 2017-10-26 NOTE — Telephone Encounter (Signed)
Patient stated she had her MRI on 10/23/17 and is requesting a return call with her results.  Patient states she will schedule an appointment if "Dr. Durward Fortes feels it is necessary."

## 2017-10-26 NOTE — Telephone Encounter (Signed)
I called and left message re the MRI scan report

## 2017-10-26 NOTE — Telephone Encounter (Signed)
ok 

## 2017-10-28 DIAGNOSIS — R5382 Chronic fatigue, unspecified: Secondary | ICD-10-CM | POA: Diagnosis not present

## 2017-10-29 ENCOUNTER — Encounter: Payer: Self-pay | Admitting: Podiatry

## 2017-10-29 ENCOUNTER — Ambulatory Visit (INDEPENDENT_AMBULATORY_CARE_PROVIDER_SITE_OTHER): Payer: Medicare Other | Admitting: Podiatry

## 2017-10-29 VITALS — BP 146/92 | HR 86

## 2017-10-29 DIAGNOSIS — M79671 Pain in right foot: Secondary | ICD-10-CM

## 2017-10-29 DIAGNOSIS — L600 Ingrowing nail: Secondary | ICD-10-CM

## 2017-10-29 MED ORDER — AMOXICILLIN-POT CLAVULANATE 500-125 MG PO TABS: 1.0000 | ORAL_TABLET | Freq: Three times a day (TID) | ORAL | 0 refills | Status: DC

## 2017-10-29 NOTE — Progress Notes (Signed)
Ingrown anil right great toe medial border, request for ingrown nail surgery. Dx. 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 Pain in right great toe. Procedure done: Excision of nail and matrix right great toe medial border. Local used: Total 5 ml of 50/50 mixture 0.5% Marcaine plain and 1% Xylocaine with Epi. Patient tolerated procedure well. Home care instruction with prescription for antibiotic e scribed. Return in one week.

## 2017-10-30 ENCOUNTER — Encounter: Payer: Self-pay | Admitting: Podiatry

## 2017-10-30 NOTE — Patient Instructions (Signed)
Ingrown nail surgery was done on right great toe medial border. Keep the foot off of weight bearing. Take medication as instructed. Return in one week.

## 2017-11-03 ENCOUNTER — Encounter: Payer: Self-pay | Admitting: Podiatry

## 2017-11-03 ENCOUNTER — Encounter: Payer: Medicare Other | Admitting: Podiatry

## 2017-11-03 ENCOUNTER — Ambulatory Visit (INDEPENDENT_AMBULATORY_CARE_PROVIDER_SITE_OTHER): Payer: Medicare Other | Admitting: Podiatry

## 2017-11-03 VITALS — BP 149/89 | HR 77 | Temp 98.2°F

## 2017-11-03 DIAGNOSIS — L600 Ingrowing nail: Secondary | ICD-10-CM | POA: Diagnosis not present

## 2017-11-04 NOTE — Patient Instructions (Signed)
5 day post op wound healing is normal.  Dressing change done.  Home care instruction given.  Return in one week.

## 2017-11-04 NOTE — Progress Notes (Signed)
S/P Excision nail and matrix right great toe medial border. Stated that the toe has been hurting some. Dressing change done. Distal end bleeding noted. Proximal border is dry under steri strips. Area cleansed with Iodine and Iodine dressing re applied. Instructed to do daily dressing change. Return in one week.

## 2017-11-09 ENCOUNTER — Encounter: Payer: Self-pay | Admitting: Podiatry

## 2017-11-09 ENCOUNTER — Ambulatory Visit (INDEPENDENT_AMBULATORY_CARE_PROVIDER_SITE_OTHER): Payer: Medicare Other | Admitting: Podiatry

## 2017-11-09 DIAGNOSIS — M216X2 Other acquired deformities of left foot: Secondary | ICD-10-CM | POA: Diagnosis not present

## 2017-11-09 DIAGNOSIS — M21962 Unspecified acquired deformity of left lower leg: Secondary | ICD-10-CM | POA: Diagnosis not present

## 2017-11-09 DIAGNOSIS — M7662 Achilles tendinitis, left leg: Secondary | ICD-10-CM

## 2017-11-09 DIAGNOSIS — E0842 Diabetes mellitus due to underlying condition with diabetic polyneuropathy: Secondary | ICD-10-CM | POA: Diagnosis not present

## 2017-11-09 NOTE — Progress Notes (Signed)
SUBJECTIVE: 62 y.o. year old female presents for follow up on right great toe nail surgery and a request for diabetic shoes. Patient turned in signed PCP certification form for diabetic shoes.  HPI: Having chronic ingrown nail problem which was treated with ingrown nail surgery on 10/29/17 with normal wound healing progress. She was also diagnosed with Achilles tendonitis by Dr. Durward Fortes in [place] and was treated with a brace.  She is still in brace on the left foot. Been diabetic for 10 years. Stated that her A1c was 8.2 this month and had to have medication adjusted. Her usual blood sugar runs between 125-135.  Been diagnosed with Fibromyalgia x 10-12 years.  Review of Systems  Constitutional: Negative.   HENT: Negative.   Eyes: Negative.   Respiratory: Negative.   Cardiovascular: Negative.   Gastrointestinal: Negative.   Genitourinary: Negative.   Musculoskeletal:       Joint pain everywhere. Diagnosed with Parkinson's, MS, Fibromyalgia,  Skin: Negative.      OBJECTIVE: DERMATOLOGIC EXAMINATION: Clean and well healed nail border right great toe medial border following nail surgery.  VASCULAR EXAMINATION OF LOWER LIMBS: All pedal pulses are palpable with normal pulsation.   NEUROLOGIC EXAMINATION OF THE LOWER LIMBS: Loss of protective sensory perception bilateral.  MUSCULOSKELETAL EXAMINATION: Positive for hypermobile first ray L>R. Hyperpronation left ankle. Normal Achilles tendon flexion and extension bilateral.  ASSESSMENT: Achilles tendonitis left. Metatarsal deformity bilateral, hypermobile first ray. Hyperpronation left ankle. Pain with ambulation left ankle. Diabetic neuropathy. Normal wound healing on right great toe nail.  PLAN: Reviewed findings and available treatment options. Right great toe nail dressed with Iodine dressing with home care instruction. Both feet measured for Diabetic shoes.

## 2017-11-09 NOTE — Patient Instructions (Signed)
Post op wound healing normal without complication on right great toe. As per request both feet measured for diabetic shoes.

## 2017-11-12 ENCOUNTER — Encounter: Payer: Self-pay | Admitting: Psychology

## 2017-11-12 NOTE — Progress Notes (Signed)
Neuropsychological Consultation   Patient:   Courtney [name]   DOB:   1955-11-17  MR Number:  [id number]  Location:  [facility] [place], [place] [id number] [place] [id number] Dept: [phone]           Date of Service:   10/20/2017  Start Time:   10 AM End Time:   11 AM  Provider/Observer:  Ilean Skill, Psy.D.       Clinical Neuropsychologist       Billing Code/Service: [phone] 4 units  Chief Complaint:    The patient was referred by Dr. Criss Rosales due to issues with chronic pain.  The patient was diagnosed with MS and fibromyalgia in 2006 she reports that she has been deteriorating since that time.  The patient reports that she has more trouble walking and talking and has significant.  Changes in expressive language functioning and also has episodes of falling.  The patient reports that the pain keeps getting worse.  [name] husband reports that there have been increasing levels of pain over the past several years.  The patient reports that she has had significant issues with depression due to all of her medical issues are out of her control.  The patient feels like she constantly goes from doctors to doctors regarding her difficulties.  She has had a number of attempts to try to alleviate her pain including trigger point injections.  She has been on numerous medications for pain as well as antidepressants.  She is also participated in physical therapy and occupational therapy her husband joined the gym.  Patient reports that she is very depressed about her current difficulties.    The above chief complaint has been reviewed for this appointment and remains valid and appropriate to the current visit.  The patient continues to struggle with chronic pain depression anxiety but reports that she has been actively working on some of the interventions we have been  developing.  Reason for Service:  The patient was referred for psychological/neuropsychological consultation due to coping issues around chronic pain and fibromyalgia as well as depression with anxiety.  Behavioral Observation: Courtney [name]  presents as a 62 y.o.-year-old Right African American Female who appeared her stated age. her dress was Appropriate and she was Well Groomed and her manners were Appropriate to the situation.  her participation was indicative of Appropriate behaviors.  There were any physical disabilities noted.  she displayed an appropriate level of cooperation and motivation.     Interactions:    Active Appropriate  Attention:   within normal limits and attention span and concentration were age appropriate  Memory:   within normal limits; recent and remote memory intact  Visuo-spatial:  within normal limits  Speech (Volume):  low  Speech:   normal; normal  Thought Process:  Coherent and Relevant  Though Content:  WNL; not suicidal and not homicidal  Orientation:   person, place, time/date and situation  Judgment:   Good  Planning:   Good  Affect:    Anxious and Depressed  Mood:    Dysphoric  Insight:   Good  Intelligence:   normal  Marital Status/Living: The patient is married and currently lives with her husband who is quite supportive of her.  Current Employment: The patient is disabled.  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  Substance Use:  No concerns of substance abuse are reported.    Education:   HS Graduate  Medical History:   Past Medical History:  Diagnosis Date  . Abnormal ECG 07/18/2015  . Arthritis   . Asthma   . Bowel obstruction ([place])   . Depression with anxiety 02/09/2015   mild xanax prn  . Diabetes mellitus ([place]) 07/18/2015   type 2  . Diverticulitis   . Dysrhythmia    abnormal ekgs worked up  . Essential tremor 03/31/2016  .  Fibromyalgia   . Gait difficulty 02/09/2015  . Gallstones   . GERD (gastroesophageal reflux disease)   . HLD (hyperlipidemia)   . Hypersomnia 11/09/2015  . Hypertension    no meds  . Hyperthyroidism   . Hypothyroidism   . IBS (irritable bowel syndrome)   . Insomnia 02/09/2015  . Migraines   . MS (multiple sclerosis) ([place])   . Multiple joint pain 03/31/2016  . Numbness in both hands 03/18/2016  . Other fatigue 02/09/2015  . Peptic ulcer   . Pneumonia   . PONV (postoperative nausea and vomiting)   . Urinary hesitancy 02/09/2015  . Vision abnormalities          Family Med/Psych History:  Family History  Problem Relation Age of Onset  . Heart disease Mother   . Kidney disease Mother   . Diabetes Mother   . Other Father 105       murdered  . Multiple sclerosis Daughter   . Multiple sclerosis Other   . Diabetes Sister   . Alcohol abuse Brother        ETOH and marijuana  . Diabetes Brother   . Diabetes Sister   . Diabetes Brother   . ALS Brother     Risk of Suicide/Violence: low the patient denies any suicidal or homicidal ideation.  Impression/DX:  The patient was referred by Dr. Criss Rosales due to issues with chronic pain.  The patient was diagnosed with MS and fibromyalgia in 2006 she reports that she has been deteriorating since that time.  The patient reports that she has more trouble walking and talking and has significant.  Changes in expressive language functioning and also has episodes of falling.  The patient reports that the pain keeps getting worse.  [name] husband reports that there have been increasing levels of pain over the past several years.  The patient reports that she has had significant issues with depression due to all of her medical issues are out of her control.  The patient feels like she constantly goes from doctors to doctors regarding her difficulties.  She has had a number of attempts to try to alleviate her pain including trigger point injections.  She  has been on numerous medications for pain as well as antidepressants.  She is also participated in physical therapy and occupational therapy her husband joined the gym.  Patient reports that she is very depressed about her current difficulties.    Disposition/Plan:  Today we continued to work on coping skills and strategies around the patient's issues related to her residual effects of MS as well as her fibromyalgia and depression symptoms.  The patient has been working on these issues going forward and we continue to develop coping skills and strategies.  Diagnosis:    Chronic pain syndrome  Multiple sclerosis (HCC)  Major depressive disorder, recurrent episode, moderate ([place])         Electronically Signed   _______________________ Jenny Reichmann  [place], Psy.D.

## 2017-11-16 DIAGNOSIS — G894 Chronic pain syndrome: Secondary | ICD-10-CM | POA: Diagnosis not present

## 2017-11-16 DIAGNOSIS — M545 Low back pain: Secondary | ICD-10-CM | POA: Diagnosis not present

## 2017-11-16 DIAGNOSIS — E1140 Type 2 diabetes mellitus with diabetic neuropathy, unspecified: Secondary | ICD-10-CM | POA: Diagnosis not present

## 2017-11-16 DIAGNOSIS — M79606 Pain in leg, unspecified: Secondary | ICD-10-CM | POA: Diagnosis not present

## 2017-11-16 DIAGNOSIS — M25519 Pain in unspecified shoulder: Secondary | ICD-10-CM | POA: Diagnosis not present

## 2017-11-16 DIAGNOSIS — M542 Cervicalgia: Secondary | ICD-10-CM | POA: Diagnosis not present

## 2017-11-16 DIAGNOSIS — Z79891 Long term (current) use of opiate analgesic: Secondary | ICD-10-CM | POA: Diagnosis not present

## 2017-11-16 DIAGNOSIS — M79672 Pain in left foot: Secondary | ICD-10-CM | POA: Diagnosis not present

## 2017-11-16 DIAGNOSIS — M797 Fibromyalgia: Secondary | ICD-10-CM | POA: Diagnosis not present

## 2017-11-18 ENCOUNTER — Ambulatory Visit: Payer: Self-pay | Admitting: Family Medicine

## 2017-11-22 ENCOUNTER — Encounter (INDEPENDENT_AMBULATORY_CARE_PROVIDER_SITE_OTHER): Payer: Self-pay | Admitting: Orthopaedic Surgery

## 2017-11-23 DIAGNOSIS — Z23 Encounter for immunization: Secondary | ICD-10-CM | POA: Diagnosis not present

## 2017-11-25 ENCOUNTER — Telehealth: Payer: Self-pay | Admitting: Cardiology

## 2017-11-25 NOTE — Telephone Encounter (Signed)
° °  Patient calling to discuss EKG results from 8/22.

## 2017-11-25 NOTE — Progress Notes (Signed)
Cardiology Office Note   Date:  11/26/2017   ID:  Courtney [name], DOB [birth year], MRN [id number]  PCP:  Lucianne Lei, MD    No chief complaint on file.  Abnormal ECG  Wt Readings from Last 3 Encounters:  11/26/17 164 lb (74.4 kg)  10/22/17 164 lb (74.4 kg)  10/15/17 164 lb (74.4 kg)       History of Present Illness: Courtney [name] is a 62 y.o. female  who has had a h/o recurrent chest pain.  SHe has been evaluated with cardiac cath in 2001 and 2006.  SHe has had multiple stress tests most recently in 2016 which was normal.    She moved to [place] in 10/16 to find warmer weather.  She has been diagnosed with MS and wants to be in a warmer climate.  She had a preop ECG before colonoscopy that was abnormal, but unchanged from multiple prior ECG.    Walking up stairs has been her most strenuous activity.  SHe has not ben exercising due to a foot injury.  She has had shoulder pain and neck pain that has limited exercise.     She describes an occasional chill in her chest.  Lasts a few seconds and resolves spontaneously.    She has balance problems.  She has fibromyalgia as well.  No recent falls.   Past Medical History:  Diagnosis Date  . Abnormal ECG 07/18/2015  . Arthritis   . Asthma   . Bowel obstruction ([place])   . Depression with anxiety 02/09/2015   mild xanax prn  . Diabetes mellitus ([place]) 07/18/2015   type 2  . Diverticulitis   . Dysrhythmia    abnormal ekgs worked up  . Essential tremor 03/31/2016  . Fibromyalgia   . Gait difficulty 02/09/2015  . Gallstones   . GERD (gastroesophageal reflux disease)   . HLD (hyperlipidemia)   . Hypersomnia 11/09/2015  . Hypertension    no meds  . Hyperthyroidism   . Hypothyroidism   . IBS (irritable bowel syndrome)   . Insomnia 02/09/2015  . Migraines   . MS (multiple sclerosis) ([place])   . Multiple joint pain 03/31/2016  . Numbness in both hands 03/18/2016  . Other fatigue 02/09/2015  . Peptic ulcer   . Pneumonia     . PONV (postoperative nausea and vomiting)   . Urinary hesitancy 02/09/2015  . Vision abnormalities     Past Surgical History:  Procedure Laterality Date  . ABDOMINAL HYSTERECTOMY  1976   including oophorectomy, due to uterine cancer  . APPENDECTOMY  1974  . CARPAL TUNNEL RELEASE     carpal tunnel surgery  . CHOLECYSTECTOMY     2006  . COLON SURGERY     SBO  . EUS N/A 04/09/2017   Procedure: UPPER ENDOSCOPIC ULTRASOUND (EUS) LINEAR;  Surgeon: Milus Banister, MD;  Location: [facility];  Service: Endoscopy;  Laterality: N/A;  . HERNIA REPAIR    . MENISCUS REPAIR Left   . SMALL INTESTINE SURGERY       Current Outpatient Medications  Medication Sig Dispense Refill  . albuterol (PROVENTIL HFA;VENTOLIN HFA) 108 (90 Base) MCG/ACT inhaler Inhale 2 puffs into the lungs every 6 (six) hours as needed for wheezing or shortness of breath. 3 Inhaler 3  . alprazolam (XANAX) 2 MG tablet Take 2 mg by mouth 3 (three) times daily as needed for anxiety.     . butalbital-acetaminophen-caffeine (FIORICET, ESGIC) 50-325-40 MG tablet Take 1  tablet by mouth 2 (two) times daily as needed for headache.     . insulin aspart protamine- aspart (NOVOLOG MIX 70/30) (70-30) 100 UNIT/ML injection Inject into the skin. Inject into skin 22 unit with breakfast, and 25 units with supper daily.    Marland Kitchen lidocaine (XYLOCAINE) 5 % ointment Apply 1 application topically daily as needed for mild pain.    . Multiple Vitamin (MULTIVITAMIN) tablet Take 1 tablet by mouth daily.    . naloxone (NARCAN) nasal spray 4 mg/0.1 mL Place 1 spray into the nose daily as needed (overdose).    . ondansetron (ZOFRAN ODT) 4 MG disintegrating tablet Take 1 tablet (4 mg total) by mouth every 6 (six) hours as needed for nausea or vomiting. 30 tablet 1  . oxyCODONE (OXYCONTIN) 10 mg 12 hr tablet TAKE ONE TABLET (10 MG) BY MOUTH EVERY TWELVE HOURS  0  . pantoprazole (PROTONIX) 40 MG tablet Take 40 mg by mouth daily.    . vitamin B-12  (CYANOCOBALAMIN) 1000 MCG tablet Take 1,000 mcg by mouth daily.     No current facility-administered medications for this visit.     Allergies:   Ibuprofen; Prednisone; Solu-medrol [methylprednisolone]; Bactrim [sulfamethoxazole-trimethoprim]; Biaxin [clarithromycin]; Naproxen; and Sulfa antibiotics    Social History:  The patient  reports that she quit smoking about 29 years ago. Her smoking use included cigarettes. She has a 1.25 pack-year smoking history. She has never used smokeless tobacco. She reports that she does not drink alcohol or use drugs.   Family History:  [name] family history includes ALS in her brother; Alcohol abuse in her brother; Diabetes in her brother, brother, mother, sister, and sister; Heart disease in her mother; Kidney disease in her mother; Multiple sclerosis in her daughter and other; Other (age of onset: 34) in her father.    ROS:  Please see the history of present illness.   Otherwise, review of systems are positive for fatigue- has had sleep study in the past- .   All other systems are reviewed and negative.    PHYSICAL EXAM: VS:  BP 134/88   Pulse 74   Ht 5\' 3"  (1.6 m)   Wt 164 lb (74.4 kg)   SpO2 96%   BMI 29.05 kg/m  , BMI Body mass index is 29.05 kg/m. GEN: Well nourished, well developed, in no acute distress  HEENT: normal  Neck: no JVD, carotid bruits, or masses Cardiac: RRR; no murmurs, rubs, or gallops,no edema  Respiratory:  clear to auscultation bilaterally, normal work of breathing GI: soft, nontender, nondistended, + BS MS: no deformity or atrophy  Skin: warm and dry, no rash Neuro: Slow gait Psych: euthymic mood, full affect   EKG:   The ekg ordered today demonstrates NSR, diffuse ST depression and T wave inversion.   Recent Labs: 04/01/2017: ALT 32; BUN 13; Creatinine, Ser 1.02; Hemoglobin 14.0; Platelets 151; Potassium 4.0; Sodium 140   Lipid Panel No results found for: CHOL, TRIG, HDL, CHOLHDL, VLDL, LDLCALC,  LDLDIRECT   Other studies Reviewed: Additional studies/ records that were reviewed today with results demonstrating: Old ECGs and 2014 echocardiogram reviewed.   ASSESSMENT AND PLAN:  1. CHest pain/DOE:  Atypical pain.  Does not sound cardiac.  Try to get back to exercise.  She has been inactive.  If she has further problems, she will let us know. 2. DM: Increase exercise.  Healthy diet.  3. Abnormal ECG: Stable pattern for many years.  No other testing needed prior to colonoscopy.  I reassured her that her ECG has not changed and to let us know if her symptoms do change.   Current medicines are reviewed at length with the patient today.  The patient concerns regarding her medicines were addressed.  The following changes have been made:  No change  Labs/ tests ordered today include:  No orders of the defined types were placed in this encounter.   Recommend 150 minutes/week of aerobic exercise Low fat, low carb, high fiber diet recommended  Disposition:   FU in 1 year   Signed, Larae Grooms, MD  11/26/2017 9:24 AM    [facility] [address], [place], [place]  [address] Phone: [phone]; Fax: [phone]

## 2017-11-25 NOTE — Telephone Encounter (Signed)
Spoke with patient who had a question about her recent EKG from 8/22.  She also mentioned that she has had an increase of SOB and a "chill" feeling in her chest and left arm.  I scheduled an appt with Dr Irish Lack 9/26.  She verbalized understanding and thankful for the call.

## 2017-11-26 ENCOUNTER — Encounter: Payer: Self-pay | Admitting: Interventional Cardiology

## 2017-11-26 ENCOUNTER — Ambulatory Visit (INDEPENDENT_AMBULATORY_CARE_PROVIDER_SITE_OTHER): Payer: Medicare Other | Admitting: Interventional Cardiology

## 2017-11-26 VITALS — BP 134/88 | HR 74 | Ht 63.0 in | Wt 164.0 lb

## 2017-11-26 DIAGNOSIS — R9431 Abnormal electrocardiogram [ECG] [EKG]: Secondary | ICD-10-CM

## 2017-11-26 DIAGNOSIS — R072 Precordial pain: Secondary | ICD-10-CM

## 2017-11-26 DIAGNOSIS — E118 Type 2 diabetes mellitus with unspecified complications: Secondary | ICD-10-CM | POA: Diagnosis not present

## 2017-11-26 NOTE — Patient Instructions (Signed)

## 2017-11-27 ENCOUNTER — Ambulatory Visit (INDEPENDENT_AMBULATORY_CARE_PROVIDER_SITE_OTHER): Payer: Medicare Other | Admitting: Orthopaedic Surgery

## 2017-11-27 ENCOUNTER — Encounter (INDEPENDENT_AMBULATORY_CARE_PROVIDER_SITE_OTHER): Payer: Self-pay | Admitting: Orthopaedic Surgery

## 2017-11-27 VITALS — BP 123/85 | HR 71 | Ht 63.0 in | Wt 164.0 lb

## 2017-11-27 DIAGNOSIS — M810 Age-related osteoporosis without current pathological fracture: Secondary | ICD-10-CM | POA: Diagnosis not present

## 2017-11-27 DIAGNOSIS — M2550 Pain in unspecified joint: Secondary | ICD-10-CM

## 2017-11-27 NOTE — Progress Notes (Signed)
Office Visit Note   Patient: Courtney [name]           Date of Birth: Nov 30, 1955           MRN: [id number] Visit Date: 11/27/2017              Requested by: Lucianne Lei, MD [address] [place], [place] [address] PCP: Lucianne Lei, MD   Assessment & Plan: Visit Diagnoses:  1. Age-related osteoporosis without current pathological fracture   2. Pain in joint, multiple sites     Plan: Chronic neck and bilateral shoulder pain.  I think her fibromyalgia is very active.  She is been followed by her primary care physician, psychologist and in the pain clinic.  She is on oxycodone and gabapentin.  I think it is worth obtaining some lab work.  I really do not have any surgical suggestions in terms of her neck or her shoulders.  She is been through a course of physical therapy.  I think her treatment is going to be medical through the above physicians.  Follow-Up Instructions: Return if symptoms worsen or fail to improve.   Orders:  Orders Placed This Encounter  Procedures  . Rheumatoid factor  . CBC with Differential/Platelet  . Sedimentation rate  . Uric acid  . ANA  . Cyclic citrul peptide antibody, IgG  . Basic metabolic panel   No orders of the defined types were placed in this encounter.     Procedures: No procedures performed   Clinical Data: No additional findings.   Subjective: Chief Complaint  Patient presents with  . Follow-up    re current bi lat shoulder pain that radiates down to elbow, l is worse. also having neck pain for several yrs, no injury. pt states she wakes up feeling swollen  Kathi called the other day and 105 he had any further suggestions in regards to her neck or shoulder pain.  She has had chronic discomfort very long period of time.  She is been through a course of therapy for her left shoulder.  She had an MRI scan revealing some irritation of the supraspinatus tendon and slight inflammation of the subacromial bursa.  She is had an  injection.  She still having pain and limitation of motion.  She is also had a lot of stiffness and soreness of her cervical spine.  She has seen Dr. Ernestina Patches.  The last time he evaluated her he did not think it was worth repeating the injection of her neck.  She has a lot of soreness and stiffness in multiple joints.  HPI  Review of Systems  Constitutional: Positive for fatigue. Negative for fever.  HENT: Negative for ear pain.   Eyes: Negative for pain.  Respiratory: Positive for shortness of breath. Negative for cough.   Gastrointestinal: Negative for constipation and diarrhea.  Genitourinary: Negative for difficulty urinating.  Musculoskeletal: Positive for back pain and neck pain.  Skin: Negative for rash.  Allergic/Immunologic: Negative for food allergies.  Neurological: Positive for weakness and numbness.  Hematological: Does not bruise/bleed easily.  Psychiatric/Behavioral: Positive for sleep disturbance.     Objective: Vital Signs: BP 123/85 (BP Location: Left Arm, Patient Position: Sitting, Cuff Size: Normal)   Pulse 71   Ht 5\' 3"  (1.6 m)   Wt 164 lb (74.4 kg)   BMI 29.05 kg/m   Physical Exam  Constitutional: She is oriented to person, place, and time. She appears well-developed and well-nourished.  HENT:  Mouth/Throat: Oropharynx is clear and moist.  Eyes: Pupils are equal, round, and reactive to light. EOM are normal.  Pulmonary/Chest: Effort normal.  Neurological: She is alert and oriented to person, place, and time.  Skin: Skin is warm and dry.  Psychiatric: She has a normal mood and affect. Her behavior is normal.    Ortho Exam affect to seem to be depressed.  Multiple areas of tenderness both shoulders posterior neck and in multiple areas of both lower extremities.  I think her fibromyalgia is very active.  She does have some stiffness in the cervical spine with inability to touch her chin to her chest lacking 2 fingerbreadths and only about 60% of normal neck  extension.  No referred pain to either upper extremity.  Some impingement bilaterally.  Good grip and good release.  No obvious swelling of her fingers.  Wears a left ankle support.  Specialty Comments:  No specialty comments available.  Imaging: No results found.   PMFS History: Patient Active Problem List   Diagnosis Date Noted  . Loss of weight   . RLQ abdominal pain 03/27/2017  . Bloating 03/27/2017  . Abnormal finding on imaging 03/27/2017  . Dilation of biliary tract 03/27/2017  . Nausea 03/27/2017  . Multiple joint pain 03/31/2016  . Essential tremor 03/31/2016  . Numbness in both hands 03/18/2016  . Hypersomnia 11/09/2015  . Diabetes mellitus ([place]) 07/18/2015  . Essential hypertension 07/18/2015  . Abnormal ECG 07/18/2015  . Gait difficulty 02/09/2015  . Fibromyalgia 02/09/2015  . Depression with anxiety 02/09/2015  . Numbness 02/09/2015  . Insomnia 02/09/2015  . Urinary hesitancy 02/09/2015  . Other fatigue 02/09/2015   Past Medical History:  Diagnosis Date  . Abnormal ECG 07/18/2015  . Arthritis   . Asthma   . Bowel obstruction ([place])   . Depression with anxiety 02/09/2015   mild xanax prn  . Diabetes mellitus ([place]) 07/18/2015   type 2  . Diverticulitis   . Dysrhythmia    abnormal ekgs worked up  . Essential tremor 03/31/2016  . Fibromyalgia   . Gait difficulty 02/09/2015  . Gallstones   . GERD (gastroesophageal reflux disease)   . HLD (hyperlipidemia)   . Hypersomnia 11/09/2015  . Hypertension    no meds  . Hyperthyroidism   . Hypothyroidism   . IBS (irritable bowel syndrome)   . Insomnia 02/09/2015  . Migraines   . MS (multiple sclerosis) ([place])   . Multiple joint pain 03/31/2016  . Numbness in both hands 03/18/2016  . Other fatigue 02/09/2015  . Peptic ulcer   . Pneumonia   . PONV (postoperative nausea and vomiting)   . Urinary hesitancy 02/09/2015  . Vision abnormalities     Family History  Problem Relation Age of Onset  . Heart disease Mother     . Kidney disease Mother   . Diabetes Mother   . Other Father 65       murdered  . Multiple sclerosis Daughter   . Multiple sclerosis Other   . Diabetes Sister   . Alcohol abuse Brother        ETOH and marijuana  . Diabetes Brother   . Diabetes Sister   . Diabetes Brother   . ALS Brother     Past Surgical History:  Procedure Laterality Date  . ABDOMINAL HYSTERECTOMY  1976   including oophorectomy, due to uterine cancer  . APPENDECTOMY  1974  . CARPAL TUNNEL RELEASE     carpal tunnel surgery  .  CHOLECYSTECTOMY     2006  . COLON SURGERY     SBO  . EUS N/A 04/09/2017   Procedure: UPPER ENDOSCOPIC ULTRASOUND (EUS) LINEAR;  Surgeon: Milus Banister, MD;  Location: [facility];  Service: Endoscopy;  Laterality: N/A;  . HERNIA REPAIR    . MENISCUS REPAIR Left   . SMALL INTESTINE SURGERY     Social History   Occupational History  . Occupation: disabled  Tobacco Use  . Smoking status: Former Smoker    Packs/day: 0.25    Years: 5.00    Pack years: 1.25    Types: Cigarettes    Last attempt to quit: 1990    Years since quitting: 29.7  . Smokeless tobacco: Never Used  Substance and Sexual Activity  . Alcohol use: No    Frequency: Never  . Drug use: No  . Sexual activity: Yes

## 2017-11-30 ENCOUNTER — Other Ambulatory Visit (INDEPENDENT_AMBULATORY_CARE_PROVIDER_SITE_OTHER): Payer: Self-pay | Admitting: *Deleted

## 2017-11-30 DIAGNOSIS — Z6830 Body mass index (BMI) 30.0-30.9, adult: Secondary | ICD-10-CM | POA: Diagnosis not present

## 2017-11-30 DIAGNOSIS — M25511 Pain in right shoulder: Secondary | ICD-10-CM

## 2017-11-30 DIAGNOSIS — M25512 Pain in left shoulder: Secondary | ICD-10-CM

## 2017-11-30 DIAGNOSIS — G8929 Other chronic pain: Secondary | ICD-10-CM

## 2017-11-30 DIAGNOSIS — M542 Cervicalgia: Secondary | ICD-10-CM

## 2017-11-30 DIAGNOSIS — R5382 Chronic fatigue, unspecified: Secondary | ICD-10-CM | POA: Diagnosis not present

## 2017-11-30 LAB — URIC ACID: Uric Acid, Serum: 4.5 mg/dL (ref 2.5–7.0)

## 2017-11-30 LAB — CBC WITH DIFFERENTIAL/PLATELET
BASOS PCT: 0.4 %
Basophils Absolute: 21 cells/uL (ref 0–200)
Eosinophils Absolute: 52 cells/uL (ref 15–500)
Eosinophils Relative: 1 %
HEMATOCRIT: 38.7 % (ref 35.0–45.0)
HEMOGLOBIN: 12.8 g/dL (ref 11.7–15.5)
LYMPHS ABS: 2506 {cells}/uL (ref 850–3900)
MCH: 27.6 pg (ref 27.0–33.0)
MCHC: 33.1 g/dL (ref 32.0–36.0)
MCV: 83.4 fL (ref 80.0–100.0)
MPV: 10.9 fL (ref 7.5–12.5)
Monocytes Relative: 10.4 %
Neutro Abs: 2080 cells/uL (ref 1500–7800)
Neutrophils Relative %: 40 %
Platelets: 173 10*3/uL (ref 140–400)
RBC: 4.64 10*6/uL (ref 3.80–5.10)
RDW: 13.2 % (ref 11.0–15.0)
Total Lymphocyte: 48.2 %
WBC: 5.2 10*3/uL (ref 3.8–10.8)
WBCMIX: 541 {cells}/uL (ref 200–950)

## 2017-11-30 LAB — BASIC METABOLIC PANEL
BUN/Creatinine Ratio: 13 (calc) (ref 6–22)
BUN: 15 mg/dL (ref 7–25)
CALCIUM: 9.4 mg/dL (ref 8.6–10.4)
CO2: 29 mmol/L (ref 20–32)
Chloride: 102 mmol/L (ref 98–110)
Creat: 1.15 mg/dL — ABNORMAL HIGH (ref 0.50–0.99)
Glucose, Bld: 130 mg/dL — ABNORMAL HIGH (ref 65–99)
POTASSIUM: 4.3 mmol/L (ref 3.5–5.3)
SODIUM: 139 mmol/L (ref 135–146)

## 2017-11-30 LAB — RHEUMATOID FACTOR

## 2017-11-30 LAB — ANA: ANA: NEGATIVE

## 2017-11-30 LAB — CYCLIC CITRUL PEPTIDE ANTIBODY, IGG: Cyclic Citrullin Peptide Ab: <16 UNITS

## 2017-11-30 LAB — SEDIMENTATION RATE: Sed Rate: 14 mm/h (ref 0–30)

## 2017-12-01 HISTORY — PX: OTHER SURGICAL HISTORY: SHX169

## 2017-12-03 ENCOUNTER — Encounter: Payer: Self-pay | Admitting: Gastroenterology

## 2017-12-04 DIAGNOSIS — M545 Low back pain: Secondary | ICD-10-CM | POA: Diagnosis not present

## 2017-12-04 DIAGNOSIS — N139 Obstructive and reflux uropathy, unspecified: Secondary | ICD-10-CM | POA: Diagnosis not present

## 2017-12-04 DIAGNOSIS — R3912 Poor urinary stream: Secondary | ICD-10-CM | POA: Diagnosis not present

## 2017-12-07 ENCOUNTER — Ambulatory Visit: Payer: Self-pay | Admitting: Cardiology

## 2017-12-08 ENCOUNTER — Encounter: Payer: Medicare Other | Admitting: Psychology

## 2017-12-08 ENCOUNTER — Encounter

## 2017-12-11 ENCOUNTER — Telehealth: Payer: Self-pay | Admitting: *Deleted

## 2017-12-14 DIAGNOSIS — E1140 Type 2 diabetes mellitus with diabetic neuropathy, unspecified: Secondary | ICD-10-CM | POA: Diagnosis not present

## 2017-12-14 DIAGNOSIS — M542 Cervicalgia: Secondary | ICD-10-CM | POA: Diagnosis not present

## 2017-12-14 DIAGNOSIS — Z79891 Long term (current) use of opiate analgesic: Secondary | ICD-10-CM | POA: Diagnosis not present

## 2017-12-14 DIAGNOSIS — M79672 Pain in left foot: Secondary | ICD-10-CM | POA: Diagnosis not present

## 2017-12-14 DIAGNOSIS — M25519 Pain in unspecified shoulder: Secondary | ICD-10-CM | POA: Diagnosis not present

## 2017-12-14 DIAGNOSIS — M79606 Pain in leg, unspecified: Secondary | ICD-10-CM | POA: Diagnosis not present

## 2017-12-14 DIAGNOSIS — M545 Low back pain: Secondary | ICD-10-CM | POA: Diagnosis not present

## 2017-12-14 DIAGNOSIS — M797 Fibromyalgia: Secondary | ICD-10-CM | POA: Diagnosis not present

## 2017-12-14 DIAGNOSIS — G894 Chronic pain syndrome: Secondary | ICD-10-CM | POA: Diagnosis not present

## 2017-12-29 ENCOUNTER — Other Ambulatory Visit: Payer: Self-pay

## 2017-12-29 DIAGNOSIS — K5900 Constipation, unspecified: Secondary | ICD-10-CM

## 2017-12-29 DIAGNOSIS — Z1211 Encounter for screening for malignant neoplasm of colon: Secondary | ICD-10-CM

## 2017-12-31 ENCOUNTER — Other Ambulatory Visit: Payer: Self-pay

## 2017-12-31 DIAGNOSIS — Z87898 Personal history of other specified conditions: Secondary | ICD-10-CM

## 2017-12-31 DIAGNOSIS — Z1211 Encounter for screening for malignant neoplasm of colon: Secondary | ICD-10-CM

## 2018-01-04 DIAGNOSIS — R5382 Chronic fatigue, unspecified: Secondary | ICD-10-CM | POA: Diagnosis not present

## 2018-01-04 DIAGNOSIS — I10 Essential (primary) hypertension: Secondary | ICD-10-CM | POA: Diagnosis not present

## 2018-01-04 DIAGNOSIS — M797 Fibromyalgia: Secondary | ICD-10-CM | POA: Diagnosis not present

## 2018-01-04 DIAGNOSIS — E119 Type 2 diabetes mellitus without complications: Secondary | ICD-10-CM | POA: Diagnosis not present

## 2018-01-06 DIAGNOSIS — F3289 Other specified depressive episodes: Secondary | ICD-10-CM | POA: Diagnosis not present

## 2018-01-06 DIAGNOSIS — G35 Multiple sclerosis: Secondary | ICD-10-CM | POA: Diagnosis not present

## 2018-01-06 DIAGNOSIS — E118 Type 2 diabetes mellitus with unspecified complications: Secondary | ICD-10-CM | POA: Diagnosis not present

## 2018-01-06 DIAGNOSIS — Z6831 Body mass index (BMI) 31.0-31.9, adult: Secondary | ICD-10-CM | POA: Diagnosis not present

## 2018-01-06 DIAGNOSIS — M797 Fibromyalgia: Secondary | ICD-10-CM | POA: Diagnosis not present

## 2018-01-11 DIAGNOSIS — Z79891 Long term (current) use of opiate analgesic: Secondary | ICD-10-CM | POA: Diagnosis not present

## 2018-01-11 DIAGNOSIS — M79606 Pain in leg, unspecified: Secondary | ICD-10-CM | POA: Diagnosis not present

## 2018-01-11 DIAGNOSIS — R3915 Urgency of urination: Secondary | ICD-10-CM | POA: Diagnosis not present

## 2018-01-11 DIAGNOSIS — M545 Low back pain: Secondary | ICD-10-CM | POA: Diagnosis not present

## 2018-01-11 DIAGNOSIS — M542 Cervicalgia: Secondary | ICD-10-CM | POA: Diagnosis not present

## 2018-01-11 DIAGNOSIS — M797 Fibromyalgia: Secondary | ICD-10-CM | POA: Diagnosis not present

## 2018-01-11 DIAGNOSIS — E1140 Type 2 diabetes mellitus with diabetic neuropathy, unspecified: Secondary | ICD-10-CM | POA: Diagnosis not present

## 2018-01-11 DIAGNOSIS — N139 Obstructive and reflux uropathy, unspecified: Secondary | ICD-10-CM | POA: Diagnosis not present

## 2018-01-11 DIAGNOSIS — R3912 Poor urinary stream: Secondary | ICD-10-CM | POA: Diagnosis not present

## 2018-01-11 DIAGNOSIS — M25519 Pain in unspecified shoulder: Secondary | ICD-10-CM | POA: Diagnosis not present

## 2018-01-11 DIAGNOSIS — G894 Chronic pain syndrome: Secondary | ICD-10-CM | POA: Diagnosis not present

## 2018-01-11 DIAGNOSIS — M79672 Pain in left foot: Secondary | ICD-10-CM | POA: Diagnosis not present

## 2018-01-13 ENCOUNTER — Encounter: Payer: Self-pay | Admitting: Gastroenterology

## 2018-01-20 ENCOUNTER — Telehealth: Payer: Self-pay | Admitting: Gastroenterology

## 2018-01-20 NOTE — Telephone Encounter (Signed)
Please schedule the procedure for next available hospital block, we can bring her in for a pre-visit if no active symptoms or complaints or office visit prior to the procedure

## 2018-01-20 NOTE — Telephone Encounter (Signed)
Called patient. Cancelled her 02/22/18 hospital colonoscopy.  Do she need to be seen by [place] before rescheduling? She was last evaluated by cardiology in Sept. Of this year. She does not have to follow up with cardiology until 11/2018. She is difficult venous access and also prefers to be scoped in the hospital.  Thanks

## 2018-01-25 ENCOUNTER — Emergency Department (HOSPITAL_COMMUNITY)
Admission: EM | Admit: 2018-01-25 | Discharge: 2018-01-25 | Disposition: A | Discharge status: Home / Self Care | Payer: Medicare Other | Attending: Emergency Medicine | Admitting: Emergency Medicine

## 2018-01-25 ENCOUNTER — Other Ambulatory Visit: Payer: Self-pay

## 2018-01-25 ENCOUNTER — Encounter (HOSPITAL_COMMUNITY): Payer: Self-pay | Admitting: Emergency Medicine

## 2018-01-25 ENCOUNTER — Emergency Department (HOSPITAL_COMMUNITY): Payer: Medicare Other

## 2018-01-25 DIAGNOSIS — Y998 Other external cause status: Secondary | ICD-10-CM | POA: Diagnosis not present

## 2018-01-25 DIAGNOSIS — E039 Hypothyroidism, unspecified: Secondary | ICD-10-CM | POA: Insufficient documentation

## 2018-01-25 DIAGNOSIS — J45909 Unspecified asthma, uncomplicated: Secondary | ICD-10-CM | POA: Diagnosis not present

## 2018-01-25 DIAGNOSIS — E785 Hyperlipidemia, unspecified: Secondary | ICD-10-CM | POA: Diagnosis not present

## 2018-01-25 DIAGNOSIS — Y929 Unspecified place or not applicable: Secondary | ICD-10-CM | POA: Insufficient documentation

## 2018-01-25 DIAGNOSIS — Z79899 Other long term (current) drug therapy: Secondary | ICD-10-CM | POA: Insufficient documentation

## 2018-01-25 DIAGNOSIS — Z87891 Personal history of nicotine dependence: Secondary | ICD-10-CM | POA: Insufficient documentation

## 2018-01-25 DIAGNOSIS — T18128A Food in esophagus causing other injury, initial encounter: Secondary | ICD-10-CM | POA: Diagnosis not present

## 2018-01-25 DIAGNOSIS — E119 Type 2 diabetes mellitus without complications: Secondary | ICD-10-CM | POA: Insufficient documentation

## 2018-01-25 DIAGNOSIS — Z794 Long term (current) use of insulin: Secondary | ICD-10-CM | POA: Insufficient documentation

## 2018-01-25 DIAGNOSIS — R0989 Other specified symptoms and signs involving the circulatory and respiratory systems: Secondary | ICD-10-CM | POA: Diagnosis present

## 2018-01-25 DIAGNOSIS — Y939 Activity, unspecified: Secondary | ICD-10-CM | POA: Insufficient documentation

## 2018-01-25 DIAGNOSIS — Y33XXXA Other specified events, undetermined intent, initial encounter: Secondary | ICD-10-CM | POA: Diagnosis not present

## 2018-01-25 DIAGNOSIS — I10 Essential (primary) hypertension: Secondary | ICD-10-CM | POA: Diagnosis not present

## 2018-01-25 DIAGNOSIS — G35 Multiple sclerosis: Secondary | ICD-10-CM | POA: Diagnosis not present

## 2018-01-25 DIAGNOSIS — R109 Unspecified abdominal pain: Secondary | ICD-10-CM | POA: Diagnosis not present

## 2018-01-25 MED ORDER — ONDANSETRON 4 MG PO TBDP
8.0000 mg | ORAL_TABLET | Freq: Once | ORAL | Status: AC
  Administered 2018-01-25: 8 mg via ORAL
  Filled 2018-01-25: qty 2

## 2018-01-25 MED ORDER — ONDANSETRON 4 MG PO TBDP
4.0000 mg | ORAL_TABLET | Freq: Once | ORAL | Status: AC
  Administered 2018-01-25: 4 mg via ORAL
  Filled 2018-01-25: qty 1

## 2018-01-25 NOTE — ED Notes (Signed)
ED Provider at bedside. 

## 2018-01-25 NOTE — ED Notes (Signed)
Patient verbalizes understanding of discharge instructions. Opportunity for questioning and answers were provided. Armband removed by staff, pt discharged from ED ambulatory.   

## 2018-01-25 NOTE — ED Notes (Signed)
Patient transported to X-ray 

## 2018-01-25 NOTE — ED Notes (Signed)
Warm fluids given PO

## 2018-01-25 NOTE — ED Triage Notes (Signed)
Pt states she was sucking on a hard candy when she accidentally swallowed it. Pt states it feels like the candy is in her throat.

## 2018-01-25 NOTE — ED Provider Notes (Signed)
[facility] EMERGENCY DEPARTMENT Provider Note   CSN: [id number] Arrival date & time: 01/25/18  0054     History   Chief Complaint Chief Complaint  Patient presents with  . food bolus    HPI Courtney [name] is a 62 y.o. female.  The history is provided by the patient.  Illness  This is a new problem. The current episode started 1 to 2 hours ago. The problem occurs constantly. The problem has not changed since onset.Pertinent negatives include no shortness of breath. The symptoms are aggravated by swallowing. Nothing relieves the symptoms. She has tried nothing for the symptoms.   Patient with multiple medical conditions presents with esophageal food impaction. She reports she woke up and had a bad taste in her mouth, and she placed a piece of candy in her mouth.  She actually swallowed the candy whole.  She denies any choking or shortness of breath.  She reports it is painful to swallow and she feels that it is "stuck" in her throat.  Also is now having some nausea and upper abdominal pain. Denies Previous history of throat or esophageal surgery.  She does not recall this ever occurring while as an adult Past Medical History:  Diagnosis Date  . Abnormal ECG 07/18/2015  . Arthritis   . Asthma   . Bowel obstruction ([place])   . Depression with anxiety 02/09/2015   mild xanax prn  . Diabetes mellitus ([place]) 07/18/2015   type 2  . Diverticulitis   . Dysrhythmia    abnormal ekgs worked up  . Essential tremor 03/31/2016  . Fibromyalgia   . Gait difficulty 02/09/2015  . Gallstones   . GERD (gastroesophageal reflux disease)   . HLD (hyperlipidemia)   . Hypersomnia 11/09/2015  . Hypertension    no meds  . Hyperthyroidism   . Hypothyroidism   . IBS (irritable bowel syndrome)   . Insomnia 02/09/2015  . Migraines   . MS (multiple sclerosis) ([place])   . Multiple joint pain 03/31/2016  . Numbness in both hands 03/18/2016  . Other fatigue 02/09/2015  . Peptic ulcer     . Pneumonia   . PONV (postoperative nausea and vomiting)   . Urinary hesitancy 02/09/2015  . Vision abnormalities     Patient Active Problem List   Diagnosis Date Noted  . Loss of weight   . RLQ abdominal pain 03/27/2017  . Bloating 03/27/2017  . Abnormal finding on imaging 03/27/2017  . Dilation of biliary tract 03/27/2017  . Nausea 03/27/2017  . Multiple joint pain 03/31/2016  . Essential tremor 03/31/2016  . Numbness in both hands 03/18/2016  . Hypersomnia 11/09/2015  . Diabetes mellitus ([place]) 07/18/2015  . Essential hypertension 07/18/2015  . Abnormal ECG 07/18/2015  . Gait difficulty 02/09/2015  . Fibromyalgia 02/09/2015  . Depression with anxiety 02/09/2015  . Numbness 02/09/2015  . Insomnia 02/09/2015  . Urinary hesitancy 02/09/2015  . Other fatigue 02/09/2015    Past Surgical History:  Procedure Laterality Date  . ABDOMINAL HYSTERECTOMY  1976   including oophorectomy, due to uterine cancer  . APPENDECTOMY  1974  . CARPAL TUNNEL RELEASE     carpal tunnel surgery  . CHOLECYSTECTOMY     2006  . COLON SURGERY     SBO  . EUS N/A 04/09/2017   Procedure: UPPER ENDOSCOPIC ULTRASOUND (EUS) LINEAR;  Surgeon: Milus Banister, MD;  Location: [facility];  Service: Endoscopy;  Laterality: N/A;  . HERNIA REPAIR    .  MENISCUS REPAIR Left   . SMALL INTESTINE SURGERY       OB History   None      Home Medications    Prior to Admission medications   Medication Sig Start Date End Date Taking? Authorizing Provider  albuterol (PROVENTIL HFA;VENTOLIN HFA) 108 (90 Base) MCG/ACT inhaler Inhale 2 puffs into the lungs every 6 (six) hours as needed for wheezing or shortness of breath. 12/19/15   Whiteheart, Cristal Ford, NP  alprazolam Duanne Moron) 2 MG tablet Take 2 mg by mouth 3 (three) times daily as needed for anxiety.     [provider]  butalbital-acetaminophen-caffeine (FIORICET, ESGIC) 50-325-40 MG tablet Take 1 tablet by mouth 2 (two) times daily as needed for  headache.     [provider]  insulin aspart protamine- aspart (NOVOLOG MIX 70/30) (70-30) 100 UNIT/ML injection Inject into the skin. Inject into skin 22 unit with breakfast, and 25 units with supper daily.    [provider]  lidocaine (XYLOCAINE) 5 % ointment Apply 1 application topically daily as needed for mild pain.    [provider]  Multiple Vitamin (MULTIVITAMIN) tablet Take 1 tablet by mouth daily.    [provider]  naloxone [facility]) nasal spray 4 mg/0.1 mL Place 1 spray into the nose daily as needed (overdose).    [provider]  ondansetron (ZOFRAN ODT) 4 MG disintegrating tablet Take 1 tablet (4 mg total) by mouth every 6 (six) hours as needed for nausea or vomiting. 03/26/17   Zehr, Laban Emperor, PA-C  oxyCODONE (OXYCONTIN) 10 mg 12 hr tablet TAKE ONE TABLET (10 MG) BY MOUTH EVERY TWELVE HOURS 10/16/17   [provider]  pantoprazole (PROTONIX) 40 MG tablet Take 40 mg by mouth daily.    [provider]  vitamin B-12 (CYANOCOBALAMIN) 1000 MCG tablet Take 1,000 mcg by mouth daily.    [provider]    Family History Family History  Problem Relation Age of Onset  . Heart disease Mother   . Kidney disease Mother   . Diabetes Mother   . Other Father 90       murdered  . Multiple sclerosis Daughter   . Multiple sclerosis Other   . Diabetes Sister   . Alcohol abuse Brother        ETOH and marijuana  . Diabetes Brother   . Diabetes Sister   . Diabetes Brother   . ALS Brother     Social History Social History   Tobacco Use  . Smoking status: Former Smoker    Packs/day: 0.25    Years: 5.00    Pack years: 1.25    Types: Cigarettes    Last attempt to quit: 1990    Years since quitting: 29.9  . Smokeless tobacco: Never Used  Substance Use Topics  . Alcohol use: No    Frequency: Never  . Drug use: No     Allergies   Ibuprofen; Prednisone; Solu-medrol [methylprednisolone]; Bactrim  [sulfamethoxazole-trimethoprim]; Biaxin [clarithromycin]; Naproxen; and Sulfa antibiotics   Review of Systems Review of Systems  Constitutional: Negative for fever.  HENT: Positive for trouble swallowing. Negative for drooling.   Respiratory: Negative for shortness of breath.   All other systems reviewed and are negative.    Physical Exam Updated Vital Signs BP 136/81 (BP Location: Right Arm)   Pulse 66   Temp (!) 97.5 F (36.4 C) (Oral)   Resp 16   Ht 1.6 m (5\' 3" )   Wt 72.6 kg  SpO2 96%   BMI 28.34 kg/m   Physical Exam CONSTITUTIONAL:.  Elderly, appears sedated HEAD: Normocephalic/atraumatic EYES: EOMI/PERRL ENMT: Mucous membranes moist, uvula midline without any foreign body noted in oropharynx, no stridor, no drooling, normal phonation is noted NECK: supple no meningeal signs, no crepitus noted SPINE/BACK:entire spine nontender CV: S1/S2 noted, no murmurs/rubs/gallops noted LUNGS: Lungs are clear to auscultation bilaterally, no apparent distress ABDOMEN: soft, nontender, no rebound or guarding, bowel sounds noted throughout abdomen, well-healed surgical scars noted NEURO: Pt is awake/alert/appropriate, moves all extremitiesx4.  No facial droop.   EXTREMITIES: pulses normal/equal, full ROM SKIN: warm, color normal PSYCH: no abnormalities of mood noted, alert and oriented to situation   ED Treatments / Results  Labs (all labs ordered are listed, but only abnormal results are displayed) Labs Reviewed - No data to display  EKG None  Radiology Dg Abd Acute W/chest  Result Date: 01/25/2018 CLINICAL DATA:  Pain. EXAM: DG ABDOMEN ACUTE W/ 1V CHEST COMPARISON:  CT 04/02/2017 FINDINGS: The cardiomediastinal contours are normal. Subsegmental linear opacity in the left lung base may be atelectasis or scarring. There is no free intra-abdominal air. Enteric sutures in the right mid abdomen and left lower few prominent small bowel loops in the central abdomen with air-fluid  levels. Small volume of stool throughout the colon. Cholecystectomy clips in the right upper quadrant. No acute osseous abnormalities are seen. Degenerative change of both hips. IMPRESSION: 1. Few prominent small bowel loops in the central abdomen with air-fluid levels, nonspecific but may represent enteritis or ileus. Early small bowel obstruction could have a similar radiographic appearance. 2. Atelectasis or scarring at the left lung base. Electronically Signed   By: [name] Rake M.D.   On: 01/25/2018 01:58    Procedures Procedures (  Medications Ordered in ED Medications  ondansetron (ZOFRAN-ODT) disintegrating tablet 8 mg (8 mg Oral Given 01/25/18 0118)  ondansetron (ZOFRAN-ODT) disintegrating tablet 4 mg (4 mg Oral Given 01/25/18 0254)     Initial Impression / Assessment and Plan / ED Course  I have reviewed the triage vital signs and the nursing notes.  Pertinent imaging results that were available during my care of the patient were reviewed by me and considered in my medical decision making (see chart for details).     1:30 AM She presents after accidentally swallowing a whole piece of candy.  No signs of any airway involvement.  She is currently handling her secretions.  She is concerned this may trigger trigger a bowel obstruction as she reports multiple episodes in the past.  After further discussion, we will proceed with an acute abdominal series. 2:58 AM Overall patient is improved.  She is now keeping down p.o. fluids.  She is concerned about a possible bowel obstruction.  However she denies any recent vomiting, constipation, or significant abdominal pain or distention.  She reports since swallowing the candy, she has had some nausea and mild upper abdominal pain but no other complaints.  On exam she is not distended, and she has bowel sounds noted throughout.  My suspicion for occult small bowel obstruction is low.  I have reviewed imaging, and feel that SBO is less  likely. 3:37 AM Patient feels improved.  She is up walking around and in no acute distress.  No vomiting.  She would like to be discharged.  Discussed need for liquid/soft diet and slowly advance.  We discussed strict ER return precautions. Patient and husband agree with plan Final Clinical Impressions(s) / ED Diagnoses  Final diagnoses:  Food impaction of esophagus, initial encounter    ED Discharge Orders    None       Ripley Fraise, MD 01/25/18 [id number]

## 2018-01-25 NOTE — Discharge Instructions (Signed)
Over the next 48 hours, if you have vomiting, chest pain, worsening abdominal pain, you must return to the ER immediately

## 2018-02-15 DIAGNOSIS — M545 Low back pain: Secondary | ICD-10-CM | POA: Diagnosis not present

## 2018-02-15 DIAGNOSIS — M79606 Pain in leg, unspecified: Secondary | ICD-10-CM | POA: Diagnosis not present

## 2018-02-15 DIAGNOSIS — M797 Fibromyalgia: Secondary | ICD-10-CM | POA: Diagnosis not present

## 2018-02-15 DIAGNOSIS — M542 Cervicalgia: Secondary | ICD-10-CM | POA: Diagnosis not present

## 2018-02-15 DIAGNOSIS — M79672 Pain in left foot: Secondary | ICD-10-CM | POA: Diagnosis not present

## 2018-02-15 DIAGNOSIS — Z79891 Long term (current) use of opiate analgesic: Secondary | ICD-10-CM | POA: Diagnosis not present

## 2018-02-15 DIAGNOSIS — E1140 Type 2 diabetes mellitus with diabetic neuropathy, unspecified: Secondary | ICD-10-CM | POA: Diagnosis not present

## 2018-02-15 DIAGNOSIS — G894 Chronic pain syndrome: Secondary | ICD-10-CM | POA: Diagnosis not present

## 2018-02-15 DIAGNOSIS — M25519 Pain in unspecified shoulder: Secondary | ICD-10-CM | POA: Diagnosis not present

## 2018-02-22 ENCOUNTER — Encounter (HOSPITAL_COMMUNITY): Payer: Self-pay

## 2018-02-22 ENCOUNTER — Ambulatory Visit (HOSPITAL_COMMUNITY): Admit: 2018-02-22 | Payer: Medicare Other | Admitting: Gastroenterology

## 2018-02-22 SURGERY — COLONOSCOPY WITH PROPOFOL
Anesthesia: Monitor Anesthesia Care

## 2018-03-10 DIAGNOSIS — F338 Other recurrent depressive disorders: Secondary | ICD-10-CM | POA: Diagnosis not present

## 2018-03-10 DIAGNOSIS — E119 Type 2 diabetes mellitus without complications: Secondary | ICD-10-CM | POA: Diagnosis not present

## 2018-03-10 DIAGNOSIS — R10816 Epigastric abdominal tenderness: Secondary | ICD-10-CM | POA: Diagnosis not present

## 2018-03-10 DIAGNOSIS — E118 Type 2 diabetes mellitus with unspecified complications: Secondary | ICD-10-CM | POA: Diagnosis not present

## 2018-03-10 DIAGNOSIS — N3281 Overactive bladder: Secondary | ICD-10-CM | POA: Diagnosis not present

## 2018-03-10 DIAGNOSIS — R252 Cramp and spasm: Secondary | ICD-10-CM | POA: Diagnosis not present

## 2018-03-10 DIAGNOSIS — I10 Essential (primary) hypertension: Secondary | ICD-10-CM | POA: Diagnosis not present

## 2018-03-10 DIAGNOSIS — E083393 Diabetes mellitus due to underlying condition with moderate nonproliferative diabetic retinopathy without macular edema, bilateral: Secondary | ICD-10-CM | POA: Diagnosis not present

## 2018-03-10 DIAGNOSIS — M797 Fibromyalgia: Secondary | ICD-10-CM | POA: Diagnosis not present

## 2018-03-10 DIAGNOSIS — J069 Acute upper respiratory infection, unspecified: Secondary | ICD-10-CM | POA: Diagnosis not present

## 2018-03-10 DIAGNOSIS — R5382 Chronic fatigue, unspecified: Secondary | ICD-10-CM | POA: Diagnosis not present

## 2018-03-10 DIAGNOSIS — L630 Alopecia (capitis) totalis: Secondary | ICD-10-CM | POA: Diagnosis not present

## 2018-03-10 DIAGNOSIS — G35 Multiple sclerosis: Secondary | ICD-10-CM | POA: Diagnosis not present

## 2018-03-15 ENCOUNTER — Telehealth: Payer: Self-pay | Admitting: Gastroenterology

## 2018-03-15 NOTE — Telephone Encounter (Signed)
Pt wants to reschedule colon from 12/23 at hospital.

## 2018-03-17 DIAGNOSIS — M797 Fibromyalgia: Secondary | ICD-10-CM | POA: Diagnosis not present

## 2018-03-17 DIAGNOSIS — M79672 Pain in left foot: Secondary | ICD-10-CM | POA: Diagnosis not present

## 2018-03-17 DIAGNOSIS — M79606 Pain in leg, unspecified: Secondary | ICD-10-CM | POA: Diagnosis not present

## 2018-03-17 DIAGNOSIS — E1140 Type 2 diabetes mellitus with diabetic neuropathy, unspecified: Secondary | ICD-10-CM | POA: Diagnosis not present

## 2018-03-17 DIAGNOSIS — G894 Chronic pain syndrome: Secondary | ICD-10-CM | POA: Diagnosis not present

## 2018-03-17 DIAGNOSIS — M542 Cervicalgia: Secondary | ICD-10-CM | POA: Diagnosis not present

## 2018-03-17 DIAGNOSIS — Z79891 Long term (current) use of opiate analgesic: Secondary | ICD-10-CM | POA: Diagnosis not present

## 2018-03-17 DIAGNOSIS — M545 Low back pain: Secondary | ICD-10-CM | POA: Diagnosis not present

## 2018-03-17 DIAGNOSIS — M25519 Pain in unspecified shoulder: Secondary | ICD-10-CM | POA: Diagnosis not present

## 2018-03-18 NOTE — Telephone Encounter (Signed)
Left message to call on the voicemail.

## 2018-03-30 DIAGNOSIS — L630 Alopecia (capitis) totalis: Secondary | ICD-10-CM | POA: Diagnosis not present

## 2018-03-30 DIAGNOSIS — Z6829 Body mass index (BMI) 29.0-29.9, adult: Secondary | ICD-10-CM | POA: Diagnosis not present

## 2018-03-30 DIAGNOSIS — E119 Type 2 diabetes mellitus without complications: Secondary | ICD-10-CM | POA: Diagnosis not present

## 2018-03-30 DIAGNOSIS — M797 Fibromyalgia: Secondary | ICD-10-CM | POA: Diagnosis not present

## 2018-03-30 DIAGNOSIS — J209 Acute bronchitis, unspecified: Secondary | ICD-10-CM | POA: Diagnosis not present

## 2018-03-30 DIAGNOSIS — I10 Essential (primary) hypertension: Secondary | ICD-10-CM | POA: Diagnosis not present

## 2018-03-31 NOTE — Telephone Encounter (Signed)
Pt called to resched colonoscopy at [facility].

## 2018-03-31 NOTE — Telephone Encounter (Signed)
Patient is on ATB for an URI. She has been on the ATB for 2 days. She will be taking these for 10 days total. She will not be able to take the appointment for 04/05/2018.

## 2018-03-31 NOTE — Telephone Encounter (Signed)
Left message to call back  

## 2018-04-07 NOTE — Telephone Encounter (Signed)
closed

## 2018-04-14 DIAGNOSIS — G894 Chronic pain syndrome: Secondary | ICD-10-CM | POA: Diagnosis not present

## 2018-04-14 DIAGNOSIS — E1140 Type 2 diabetes mellitus with diabetic neuropathy, unspecified: Secondary | ICD-10-CM | POA: Diagnosis not present

## 2018-04-14 DIAGNOSIS — M797 Fibromyalgia: Secondary | ICD-10-CM | POA: Diagnosis not present

## 2018-04-14 DIAGNOSIS — M79672 Pain in left foot: Secondary | ICD-10-CM | POA: Diagnosis not present

## 2018-04-14 DIAGNOSIS — M545 Low back pain: Secondary | ICD-10-CM | POA: Diagnosis not present

## 2018-04-14 DIAGNOSIS — M542 Cervicalgia: Secondary | ICD-10-CM | POA: Diagnosis not present

## 2018-04-14 DIAGNOSIS — Z79891 Long term (current) use of opiate analgesic: Secondary | ICD-10-CM | POA: Diagnosis not present

## 2018-04-14 DIAGNOSIS — M25519 Pain in unspecified shoulder: Secondary | ICD-10-CM | POA: Diagnosis not present

## 2018-04-14 DIAGNOSIS — M79606 Pain in leg, unspecified: Secondary | ICD-10-CM | POA: Diagnosis not present

## 2018-04-15 DIAGNOSIS — E119 Type 2 diabetes mellitus without complications: Secondary | ICD-10-CM | POA: Diagnosis not present

## 2018-04-15 DIAGNOSIS — I10 Essential (primary) hypertension: Secondary | ICD-10-CM | POA: Diagnosis not present

## 2018-04-15 DIAGNOSIS — J0100 Acute maxillary sinusitis, unspecified: Secondary | ICD-10-CM | POA: Diagnosis not present

## 2018-04-15 DIAGNOSIS — R5382 Chronic fatigue, unspecified: Secondary | ICD-10-CM | POA: Diagnosis not present

## 2018-04-15 DIAGNOSIS — J209 Acute bronchitis, unspecified: Secondary | ICD-10-CM | POA: Diagnosis not present

## 2018-04-15 DIAGNOSIS — E079 Disorder of thyroid, unspecified: Secondary | ICD-10-CM | POA: Diagnosis not present

## 2018-04-15 DIAGNOSIS — F338 Other recurrent depressive disorders: Secondary | ICD-10-CM | POA: Diagnosis not present

## 2018-04-22 ENCOUNTER — Ambulatory Visit (INDEPENDENT_AMBULATORY_CARE_PROVIDER_SITE_OTHER): Payer: Medicare Other

## 2018-04-22 ENCOUNTER — Ambulatory Visit (INDEPENDENT_AMBULATORY_CARE_PROVIDER_SITE_OTHER): Payer: Medicare Other | Admitting: Orthopedic Surgery

## 2018-04-22 ENCOUNTER — Encounter (INDEPENDENT_AMBULATORY_CARE_PROVIDER_SITE_OTHER): Payer: Self-pay | Admitting: Orthopedic Surgery

## 2018-04-22 VITALS — BP 139/94 | HR 68 | Ht 63.0 in | Wt 155.0 lb

## 2018-04-22 DIAGNOSIS — M25512 Pain in left shoulder: Secondary | ICD-10-CM | POA: Diagnosis not present

## 2018-04-22 DIAGNOSIS — M7502 Adhesive capsulitis of left shoulder: Secondary | ICD-10-CM

## 2018-04-22 DIAGNOSIS — G8929 Other chronic pain: Secondary | ICD-10-CM

## 2018-04-22 MED ORDER — BUPIVACAINE HCL 0.5 % IJ SOLN
2.0000 mL | INTRAMUSCULAR | Status: AC | PRN
  Administered 2018-04-22: 2 mL via INTRA_ARTICULAR

## 2018-04-22 MED ORDER — METHYLPREDNISOLONE ACETATE 40 MG/ML IJ SUSP
40.0000 mg | INTRAMUSCULAR | Status: AC | PRN
  Administered 2018-04-22: 40 mg via INTRA_ARTICULAR

## 2018-04-22 MED ORDER — LIDOCAINE HCL 1 % IJ SOLN
1.0000 mL | INTRAMUSCULAR | Status: AC | PRN
  Administered 2018-04-22: 1 mL

## 2018-04-22 MED ORDER — LIDOCAINE HCL 1 % IJ SOLN
3.0000 mL | INTRAMUSCULAR | Status: AC | PRN
  Administered 2018-04-22: 3 mL

## 2018-04-22 MED ORDER — METHYLPREDNISOLONE ACETATE 40 MG/ML IJ SUSP
80.0000 mg | INTRAMUSCULAR | Status: AC | PRN
  Administered 2018-04-22: 80 mg via INTRA_ARTICULAR
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
## 2018-04-22 NOTE — Addendum Note (Signed)
Addended by: Lendon Collar on: 04/22/2018 01:09 PM   Modules accepted: Orders

## 2018-04-26 ENCOUNTER — Ambulatory Visit (INDEPENDENT_AMBULATORY_CARE_PROVIDER_SITE_OTHER): Payer: Medicare Other | Admitting: Physician Assistant

## 2018-04-26 ENCOUNTER — Other Ambulatory Visit (INDEPENDENT_AMBULATORY_CARE_PROVIDER_SITE_OTHER): Payer: Medicare Other

## 2018-04-26 ENCOUNTER — Encounter: Payer: Self-pay | Admitting: Physician Assistant

## 2018-04-26 ENCOUNTER — Telehealth: Payer: Self-pay | Admitting: Gastroenterology

## 2018-04-26 VITALS — BP 136/80 | HR 76 | Ht 63.0 in | Wt 159.3 lb

## 2018-04-26 DIAGNOSIS — R1032 Left lower quadrant pain: Secondary | ICD-10-CM | POA: Diagnosis not present

## 2018-04-26 DIAGNOSIS — K5732 Diverticulitis of large intestine without perforation or abscess without bleeding: Secondary | ICD-10-CM | POA: Diagnosis not present

## 2018-04-26 DIAGNOSIS — R1031 Right lower quadrant pain: Secondary | ICD-10-CM

## 2018-04-26 DIAGNOSIS — K5900 Constipation, unspecified: Secondary | ICD-10-CM | POA: Diagnosis not present

## 2018-04-26 LAB — CBC WITH DIFFERENTIAL/PLATELET
Basophils Absolute: 0 10*3/uL (ref 0.0–0.1)
Basophils Relative: 0.5 % (ref 0.0–3.0)
EOS ABS: 0 10*3/uL (ref 0.0–0.7)
EOS PCT: 0.5 % (ref 0.0–5.0)
HEMATOCRIT: 43 % (ref 36.0–46.0)
Hemoglobin: 14.5 g/dL (ref 12.0–15.0)
LYMPHS PCT: 34.8 % (ref 12.0–46.0)
Lymphs Abs: 2.2 10*3/uL (ref 0.7–4.0)
MCHC: 33.7 g/dL (ref 30.0–36.0)
MCV: 83.9 fl (ref 78.0–100.0)
MONO ABS: 0.6 10*3/uL (ref 0.1–1.0)
Monocytes Relative: 9.1 % (ref 3.0–12.0)
Neutro Abs: 3.5 10*3/uL (ref 1.4–7.7)
Neutrophils Relative %: 55.1 % (ref 43.0–77.0)
Platelets: 179 10*3/uL (ref 150.0–400.0)
RBC: 5.12 Mil/uL — AB (ref 3.87–5.11)
RDW: 14.1 % (ref 11.5–15.5)
WBC: 6.4 10*3/uL (ref 4.0–10.5)

## 2018-04-26 LAB — COMPREHENSIVE METABOLIC PANEL
ALBUMIN: 4.9 g/dL (ref 3.5–5.2)
ALK PHOS: 87 U/L (ref 39–117)
ALT: 15 U/L (ref 0–35)
AST: 21 U/L (ref 0–37)
BUN: 16 mg/dL (ref 6–23)
CALCIUM: 9.9 mg/dL (ref 8.4–10.5)
CHLORIDE: 100 meq/L (ref 96–112)
CO2: 30 mEq/L (ref 19–32)
Creatinine, Ser: 1.05 mg/dL (ref 0.40–1.20)
GFR: 63.97 mL/min (ref >60.00)
Glucose, Bld: 106 mg/dL — ABNORMAL HIGH (ref 70–99)
POTASSIUM: 3.8 meq/L (ref 3.5–5.1)
Sodium: 139 mEq/L (ref 135–145)
TOTAL PROTEIN: 8.5 g/dL — AB (ref 6.0–8.3)
Total Bilirubin: 0.5 mg/dL (ref 0.2–1.2)

## 2018-04-26 MED ORDER — CIPROFLOXACIN HCL 500 MG PO TABS: 500.0000 mg | ORAL_TABLET | Freq: Two times a day (BID) | ORAL | 0 refills | Status: DC

## 2018-04-26 MED ORDER — METRONIDAZOLE 500 MG PO TABS: 500.0000 mg | ORAL_TABLET | Freq: Two times a day (BID) | ORAL | 0 refills | Status: DC

## 2018-04-26 MED ORDER — LUBIPROSTONE 24 MCG PO CAPS: 24.0000 ug | ORAL_CAPSULE | Freq: Two times a day (BID) | ORAL | 6 refills | Status: DC

## 2018-04-26 NOTE — Patient Instructions (Addendum)
If you are age 64 or older, your body mass index should be between 23-30. Your Body mass index is 28.22 kg/m. If this is out of the aforementioned range listed, please consider follow up with your Primary Care Provider.  If you are age 48 or younger, your body mass index should be between 19-25. Your Body mass index is 28.22 kg/m. If this is out of the aformentioned range listed, please consider follow up with your Primary Care Provider.   Your provider has requested that you go to the basement level for lab work before leaving today. Press "B" on the elevator. The lab is located at the first door on the left as you exit the elevator.  We have sent the following medications to your pharmacy for you to pick up at your convenience: Flagyl Cipro Amitiza  Push fluids.  Follow up with me on May 06, 2018 at 3:30 pm.  Thank you for choosing me and [facility].   Amy Esterwood, PA-C

## 2018-04-26 NOTE — Telephone Encounter (Signed)
Pt reported abd p.  Pt mentioned hx of diverticulosis, 2 small bowel obstruction surgeries.  Please advise.

## 2018-04-26 NOTE — Telephone Encounter (Signed)
Called the patient. She agrees to an appointment for evaluation.

## 2018-04-27 ENCOUNTER — Encounter: Payer: Self-pay | Admitting: Physician Assistant

## 2018-04-27 NOTE — Progress Notes (Signed)
Subjective:    Patient ID: Courtney [name], female    DOB: 06-Dec-1955, 63 y.o.   MRN: [id number]  HPI Courtney [name] is a 63 year old African-American female, established with Dr. Silverio Decamp..  She was last seen about a year ago when she established care. She comes in today with complaints of 1 week history of abdominal pain.  She describes a lot of gas and gurgling in her abdomen.  She has been having fairly regular bowel movements per her with use of Colace.  She does not feel that she evacuates her bowel very well.  She has been having abdominal pain which started in the left abdomen radiating across into the mid abdomen and over the past couple of days has felt some into the right lower quadrant.  She describes it as a constant achy type of discomfort and she says it reminds her of prior episodes of diverticulitis.  She has not had any nausea or vomiting, no documented fever or hematochezia. She last had colonoscopy in 2014 in [place] had 2 small polyps removed on the hepatic flexure.  These were tubular adenomas and she was recommended to have 5-year interval follow-up. She had EGD in December 2018 also done in [place] which was normal. EUS was done here in February 2019 evaluate dilated common bile duct.  She was noted to have a CBD of 8 mm and common hepatic duct of 10 mm and otherwise negative exam.  This was felt to be physiologic post cholecystectomy. Gastric emptying scan in 2018 was normal. Prior CT imaging January 2019 in addition to dilated biliary tree showed scattered diverticuli throughout the colon and surgical clips consistent with a small bowel anastomosis in the right and left mid abdomen mild dilation at the site of the anastomosis. Patient has a complicated history with hypertension, fibromyalgia, depression, adult onset diabetes mellitus, multiple sclerosis, chronic pain syndrome for which she is on chronic narcotics, previous small bowel obstruction, prior appendectomy and  hysterectomy.  Review of Systems Pertinent positive and negative review of systems were noted in the above HPI section.  All other review of systems was otherwise negative.  Outpatient Encounter Medications as of 04/26/2018  Medication Sig  . albuterol (PROVENTIL HFA;VENTOLIN HFA) 108 (90 Base) MCG/ACT inhaler Inhale 2 puffs into the lungs every 6 (six) hours as needed for wheezing or shortness of breath.  . alprazolam (XANAX) 2 MG tablet Take 2 mg by mouth 3 (three) times daily as needed for anxiety.   Marland Kitchen amLODipine (NORVASC) 5 MG tablet TK 1 T PO  HS  . butalbital-acetaminophen-caffeine (FIORICET, ESGIC) 50-325-40 MG tablet Take 1 tablet by mouth 2 (two) times daily as needed for headache.   . empagliflozin (JARDIANCE) 10 MG TABS tablet Take 10 mg by mouth daily.  . insulin aspart protamine- aspart (NOVOLOG MIX 70/30) (70-30) 100 UNIT/ML injection Inject 18-20 Units into the skin 2 (two) times daily. Inject into skin 18 unit with breakfast, and 20 units with supper daily.  Marland Kitchen lidocaine (XYLOCAINE) 5 % ointment Apply 1 application topically daily as needed for mild pain.  Marland Kitchen lubiprostone (AMITIZA) 24 MCG capsule Take 1 capsule (24 mcg total) by mouth 2 (two) times daily with a meal.  . Multiple Vitamin (MULTIVITAMIN) tablet Take 1 tablet by mouth daily.  . naloxone (NARCAN) nasal spray 4 mg/0.1 mL Place 1 spray into the nose daily as needed (overdose).  [name] AD 4-10-325 MG TABS Take 1 tablet by mouth 4 (four) times daily.  Marland Kitchen  ondansetron (ZOFRAN ODT) 4 MG disintegrating tablet Take 1 tablet (4 mg total) by mouth every 6 (six) hours as needed for nausea or vomiting.  Marland Kitchen oxyCODONE (OXY IR/ROXICODONE) 5 MG immediate release tablet Take 20 mg by mouth every 4 (four) hours as needed for severe pain.  Marland Kitchen oxyCODONE (OXYCONTIN) 10 mg 12 hr tablet Take 10 mg by mouth every 12 (twelve) hours.   . Oxycodone HCl 20 MG TABS TK 1 T PO  Q 4 H PRN  . pantoprazole (PROTONIX) 40 MG tablet Take 40 mg by mouth daily  as needed (acid reflux).   . [DISCONTINUED] lubiprostone (AMITIZA) 24 MCG capsule Take 24 mcg by mouth daily as needed.   . ciprofloxacin (CIPRO) 500 MG tablet Take 1 tablet (500 mg total) by mouth 2 (two) times daily. Take for 10 days.  . metroNIDAZOLE (FLAGYL) 500 MG tablet Take 1 tablet (500 mg total) by mouth 2 (two) times daily. Take for 10 days   No facility-administered encounter medications on file as of 04/26/2018.    Allergies  Allergen Reactions  . Ibuprofen Other (See Comments)    Developed a ulcer  Other reaction(s): Other (See Comments) Developed a ulcer   . Prednisone Anaphylaxis, Shortness Of Breath and Palpitations  . Solu-Medrol [Methylprednisolone] Anaphylaxis, Shortness Of Breath and Palpitations  . Bactrim [Sulfamethoxazole-Trimethoprim] Other (See Comments)    "blotchiness" redness, face swelling  . Biaxin [Clarithromycin] Swelling  . Naproxen Other (See Comments)    Developed a ulcer   . Sulfa Antibiotics Other (See Comments)    Other reaction(s): Angioedema Other reaction(s): Angioedema   Patient Active Problem List   Diagnosis Date Noted  . Loss of weight   . RLQ abdominal pain 03/27/2017  . Bloating 03/27/2017  . Abnormal finding on imaging 03/27/2017  . Dilation of biliary tract 03/27/2017  . Nausea 03/27/2017  . Multiple joint pain 03/31/2016  . Essential tremor 03/31/2016  . Numbness in both hands 03/18/2016  . Hypersomnia 11/09/2015  . Diabetes mellitus ([place]) 07/18/2015  . Essential hypertension 07/18/2015  . Abnormal ECG 07/18/2015  . Gait difficulty 02/09/2015  . Fibromyalgia 02/09/2015  . Depression with anxiety 02/09/2015  . Numbness 02/09/2015  . Insomnia 02/09/2015  . Urinary hesitancy 02/09/2015  . Other fatigue 02/09/2015   Social History   Socioeconomic History  . Marital status: Married    Spouse name: Jimmie  . Number of children: 4  . Years of education: Not on file  . Highest education level: 12th grade  Occupational  History  . Occupation: disabled  Social Needs  . Financial resource strain: Not on file  . Food insecurity:    Worry: Not on file    Inability: Not on file  . Transportation needs:    Medical: Not on file    Non-medical: Not on file  Tobacco Use  . Smoking status: Former Smoker    Packs/day: 0.25    Years: 5.00    Pack years: 1.25    Types: Cigarettes    Last attempt to quit: 1990    Years since quitting: 30.1  . Smokeless tobacco: Never Used  Substance and Sexual Activity  . Alcohol use: No    Frequency: Never  . Drug use: No  . Sexual activity: Yes  Lifestyle  . Physical activity:    Days per week: Not on file    Minutes per session: Not on file  . Stress: Not on file  Relationships  . Social connections:    Talks  on phone: Not on file    Gets together: Not on file    Attends religious service: Not on file    Active member of club or organization: Not on file    Attends meetings of clubs or organizations: Not on file    Relationship status: Not on file  . Intimate partner violence:    Fear of current or ex partner: Not on file    Emotionally abused: Not on file    Physically abused: Not on file    Forced sexual activity: Not on file  Other Topics Concern  . Not on file  Social History Narrative   Pt is right-handed. She lives with her husband in a 2 story house. Pt avoids caffeine. She and her husband go to the gym, lately once weekly, previously 3 times a week.     Ms. Graham Doukas family history includes ALS in her brother; Alcohol abuse in her brother; Diabetes in her brother, brother, mother, sister, and sister; Heart disease in her mother; Kidney disease in her mother; Multiple sclerosis in her daughter and another family member; Other (age of onset: 11) in her father.      Objective:    Vitals:   04/26/18 1450  BP: 136/80  Pulse: 76    Physical Exam; well-developed older African-American female no acute distress, apnea by her husband.  Height 5  foot 3, weight 159, BMI 28.2.  HEENT; nontraumatic, normocephalic EOMI PERRLA sclera anicteric, oral mucosa moist, Cardiovascular; regular rate and rhythm with S1-S2, Pulmonary; clear bilaterally, Abdomen ;soft, bowel sounds are present, nondistended, she is tender in the left lower quadrant suprapubic area and mildly in the right lower quadrant no guarding or rebound no palpable mass or hepatosplenomegaly.  Rectal; exam not done, Extremities ;no clubbing cyanosis or edema skin warm and dry, Neuropsych; alert and oriented, mood and affect appropriate       Assessment & Plan:   #12 63 year old African-American female with multiple medical problems venting with 1 week history of lower abdominal pain [place] in the left abdomen radiating towards midline and now feeling in the right lower quadrant as well.  Patient has had prior diverticulitis and this pain is reminiscent. Prior CT imaging a year ago showed scattered diverticuli throughout the colon. Patient is also had multiple abdominal surgeries including a surgery for small bowel obstruction.  She does not currently have any obstructive symptoms.  I suspect she does have mild diverticulitis.  2 constipation-likely opioid induced.-Patient had previously been reluctant to try Amitiza as she did not want to get dependent on anything.  She continues to struggle with constipation.  She has been given Relistor by her pain management clinic she has used on 2 occasions with success.  #3 history of adenomatous colon polyps and last colonoscopy 2014 due for follow-up colonoscopy this spring. #4.  Status post cholecystectomy, appendectomy, hysterectomy previous surgery for small bowel obstruction #5 MS #6 fibromyalgia #7.  Chronic pain syndrome on chronic narcotics #8.  Hypertension #9.  Adult onset diabetes mellitus #10.  Depression  Plan; Will start Cipro 500 mg p.o. twice daily x10 days and metronidazole 500 mg p.o. twice daily x10 days both to be taken  with food. Will check CBC with differential, c-Met. Discussed potential CT scan.  Patient would like to proceed with a course of antibiotics first.  She understands that if her symptoms do not resolve she will need CT of the abdomen and pelvis. Also discussed benefit of prescription medication for chronic  constipation given her chronic narcotic use.  She is willing to try Amitiza 24 mcg twice daily understands that this is not a laxative nor habit-forming. Plan office follow-up in about 3 weeks.  At that time she will need to be scheduled for follow-up colonoscopy if acute diverticulitis has completely resolved. Taft Worthing S Anastasia Tompson PA-C 04/27/2018   Cc: Lucianne Lei, MD

## 2018-04-28 ENCOUNTER — Telehealth: Payer: Self-pay | Admitting: Physician Assistant

## 2018-04-28 NOTE — Telephone Encounter (Signed)
Patient is on antibiotics for diverticulitis. She is feeling better. She also took Amitiza and had "a good bowel movement." She will finish the antibiotics. Call if she acutely worsens or fails to continue improving.

## 2018-04-28 NOTE — Telephone Encounter (Signed)
Pt called in wanting to speak with the nurse to see since her lab work was good do she still need to take the antibiotics that were prescribe by the doctor?

## 2018-04-28 NOTE — Progress Notes (Signed)
Reviewed and agree with documentation and assessment and plan. K. Veena Kianni Lheureux , MD   

## 2018-04-30 ENCOUNTER — Encounter: Payer: Self-pay | Admitting: Physical Therapy

## 2018-04-30 ENCOUNTER — Other Ambulatory Visit: Payer: Self-pay

## 2018-04-30 ENCOUNTER — Ambulatory Visit: Payer: Medicare Other | Attending: Orthopedic Surgery | Admitting: Physical Therapy

## 2018-04-30 DIAGNOSIS — M25612 Stiffness of left shoulder, not elsewhere classified: Secondary | ICD-10-CM | POA: Diagnosis not present

## 2018-04-30 DIAGNOSIS — M25512 Pain in left shoulder: Secondary | ICD-10-CM | POA: Insufficient documentation

## 2018-04-30 DIAGNOSIS — M6281 Muscle weakness (generalized): Secondary | ICD-10-CM | POA: Insufficient documentation

## 2018-04-30 DIAGNOSIS — G8929 Other chronic pain: Secondary | ICD-10-CM | POA: Insufficient documentation
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
## 2018-05-05 ENCOUNTER — Telehealth: Payer: Self-pay | Admitting: Physician Assistant

## 2018-05-05 NOTE — Telephone Encounter (Signed)
Patient states today she feels "good, it's a good day." She is completing the course of antibiotics. Her colon cancer has been cancelled until she is fully recovered from diverticulitis. She is taking Relistor for chronic opiod induced constipation. She has spells of abdominal pain that "move around to different areas of my abdomen and go away after a bowel movement." She feels she is doing well overall. Thanks me for my call.

## 2018-05-06 ENCOUNTER — Ambulatory Visit (INDEPENDENT_AMBULATORY_CARE_PROVIDER_SITE_OTHER): Payer: Medicare Other | Admitting: Orthopaedic Surgery

## 2018-05-06 ENCOUNTER — Ambulatory Visit: Payer: Self-pay | Admitting: Physician Assistant

## 2018-05-06 ENCOUNTER — Encounter (INDEPENDENT_AMBULATORY_CARE_PROVIDER_SITE_OTHER): Payer: Self-pay | Admitting: Orthopaedic Surgery

## 2018-05-06 VITALS — BP 118/76 | HR 72 | Ht 63.0 in | Wt 153.0 lb

## 2018-05-06 DIAGNOSIS — G8929 Other chronic pain: Secondary | ICD-10-CM | POA: Diagnosis not present

## 2018-05-06 DIAGNOSIS — M25512 Pain in left shoulder: Secondary | ICD-10-CM

## 2018-05-06 NOTE — Progress Notes (Signed)
Office Visit Note   Patient: Courtney [name]           Date of Birth: June 04, 1955           MRN: [id number] Visit Date: 05/06/2018              Requested by: Lucianne Lei, MD [address] [place], [place] [address] PCP: Lucianne Lei, MD   Assessment & Plan: Visit Diagnoses:  1. Chronic left shoulder pain     Plan: Adhesive capsulitis left shoulder appears to be resolving for the intra-articular and subacromial cortisone injection and home exercises.  Presently able to place her arm fully overhead and abduct without too much trouble.  I think her fibromyalgia is active with multiple areas of trigger point tenderness along the upper left scapula.  Will encourage her to work with her exercises and follow-up with the therapy and come back as needed  Follow-Up Instructions: Return if symptoms worsen or fail to improve.   Orders:  No orders of the defined types were placed in this encounter.  No orders of the defined types were placed in this encounter.     Procedures: No procedures performed   Clinical Data: No additional findings.   Subjective: Chief Complaint  Patient presents with  . Left Shoulder - Follow-up  Patient presents today for a two week follow up on her left shoulder adhesive capsulitis. She had a cortisone injection on 04/22/18. Patient states that has noticed improvement in her shoulder with the injection. She has went for her first initial visit to therapy and has been doing home exercises.  Seen 2 weeks ago by Aaron Edelman who injected the left shoulder subacromial area and glenohumeral joint.  Mrs. Courtney [name] has had significant relief of her pain and I was able to place her arm over her head. HPI  Review of Systems   Objective: Vital Signs: BP 118/76   Pulse 72   Ht 5\' 3"  (1.6 m)   Wt 153 lb (69.4 kg)   BMI 27.10 kg/m   Physical Exam Constitutional:      Appearance: She is well-developed.  Eyes:     Pupils: Pupils are equal, round, and reactive to  light.  Pulmonary:     Effort: Pulmonary effort is normal.  Skin:    General: Skin is warm and dry.  Neurological:     Mental Status: She is alert and oriented to person, place, and time.  Psychiatric:        Behavior: Behavior normal.     Ortho Exam awake alert and oriented x3.  Comfortable sitting.  Uses a cane for ambulation.  I was able to passively place her left arm fully overhead and abduct 90 degrees and she can maintain that position.  Multiple areas of trigger point tenderness along the posterior aspect of her left scapula which I think is consistent with her fibromyalgia.  Negative impingement.  Good grip and good release  Specialty Comments:  No specialty comments available.  Imaging: No results found.   PMFS History: Patient Active Problem List   Diagnosis Date Noted  . Loss of weight   . RLQ abdominal pain 03/27/2017  . Bloating 03/27/2017  . Abnormal finding on imaging 03/27/2017  . Dilation of biliary tract 03/27/2017  . Nausea 03/27/2017  . Multiple joint pain 03/31/2016  . Essential tremor 03/31/2016  . Numbness in both hands 03/18/2016  . Hypersomnia 11/09/2015  . Diabetes mellitus ([place]) 07/18/2015  . Essential hypertension  07/18/2015  . Abnormal ECG 07/18/2015  . Gait difficulty 02/09/2015  . Fibromyalgia 02/09/2015  . Depression with anxiety 02/09/2015  . Numbness 02/09/2015  . Insomnia 02/09/2015  . Urinary hesitancy 02/09/2015  . Other fatigue 02/09/2015   Past Medical History:  Diagnosis Date  . Abnormal ECG 07/18/2015  . Arthritis   . Asthma   . Bowel obstruction ([place])   . Depression with anxiety 02/09/2015   mild xanax prn  . Diabetes mellitus ([place]) 07/18/2015   type 2  . Diverticulitis   . Dysrhythmia    abnormal ekgs worked up  . Essential tremor 03/31/2016  . Fibromyalgia   . Gait difficulty 02/09/2015  . Gallstones   . GERD (gastroesophageal reflux disease)   . HLD (hyperlipidemia)   . Hypersomnia 11/09/2015  . Hypertension     no meds  . Hyperthyroidism   . Hypothyroidism   . IBS (irritable bowel syndrome)   . Insomnia 02/09/2015  . Migraines   . MS (multiple sclerosis) ([place])   . Multiple joint pain 03/31/2016  . Numbness in both hands 03/18/2016  . Other fatigue 02/09/2015  . Peptic ulcer   . Pneumonia   . PONV (postoperative nausea and vomiting)   . Urinary hesitancy 02/09/2015  . Vision abnormalities     Family History  Problem Relation Age of Onset  . Heart disease Mother   . Kidney disease Mother   . Diabetes Mother   . Other Father 56       murdered  . Multiple sclerosis Daughter   . Multiple sclerosis Other   . Diabetes Sister   . Alcohol abuse Brother        ETOH and marijuana  . Diabetes Brother   . Diabetes Sister   . Diabetes Brother   . ALS Brother     Past Surgical History:  Procedure Laterality Date  . ABDOMINAL HYSTERECTOMY  1976   including oophorectomy, due to uterine cancer  . APPENDECTOMY  1974  . CARPAL TUNNEL RELEASE Left    carpal tunnel surgery  . CHOLECYSTECTOMY     2006  . COLON SURGERY     SBO x 2  . EUS N/A 04/09/2017   Procedure: UPPER ENDOSCOPIC ULTRASOUND (EUS) LINEAR;  Surgeon: Milus Banister, MD;  Location: [facility];  Service: Endoscopy;  Laterality: N/A;  . HERNIA REPAIR    . MENISCUS REPAIR Left   . SMALL INTESTINE SURGERY     Social History   Occupational History  . Occupation: disabled  Tobacco Use  . Smoking status: Former Smoker    Packs/day: 0.25    Years: 5.00    Pack years: 1.25    Types: Cigarettes    Last attempt to quit: 1990    Years since quitting: 30.1  . Smokeless tobacco: Never Used  Substance and Sexual Activity  . Alcohol use: No    Frequency: Never  . Drug use: No  . Sexual activity: Yes

## 2018-05-11 DIAGNOSIS — M797 Fibromyalgia: Secondary | ICD-10-CM | POA: Diagnosis not present

## 2018-05-11 DIAGNOSIS — E1169 Type 2 diabetes mellitus with other specified complication: Secondary | ICD-10-CM | POA: Diagnosis not present

## 2018-05-11 DIAGNOSIS — R5382 Chronic fatigue, unspecified: Secondary | ICD-10-CM | POA: Diagnosis not present

## 2018-05-11 DIAGNOSIS — R252 Cramp and spasm: Secondary | ICD-10-CM | POA: Diagnosis not present

## 2018-05-12 DIAGNOSIS — E1140 Type 2 diabetes mellitus with diabetic neuropathy, unspecified: Secondary | ICD-10-CM | POA: Diagnosis not present

## 2018-05-12 DIAGNOSIS — M79672 Pain in left foot: Secondary | ICD-10-CM | POA: Diagnosis not present

## 2018-05-12 DIAGNOSIS — M797 Fibromyalgia: Secondary | ICD-10-CM | POA: Diagnosis not present

## 2018-05-12 DIAGNOSIS — M545 Low back pain: Secondary | ICD-10-CM | POA: Diagnosis not present

## 2018-05-12 DIAGNOSIS — G894 Chronic pain syndrome: Secondary | ICD-10-CM | POA: Diagnosis not present

## 2018-05-12 DIAGNOSIS — M25519 Pain in unspecified shoulder: Secondary | ICD-10-CM | POA: Diagnosis not present

## 2018-05-12 DIAGNOSIS — M542 Cervicalgia: Secondary | ICD-10-CM | POA: Diagnosis not present

## 2018-05-12 DIAGNOSIS — M79606 Pain in leg, unspecified: Secondary | ICD-10-CM | POA: Diagnosis not present

## 2018-05-12 DIAGNOSIS — Z79891 Long term (current) use of opiate analgesic: Secondary | ICD-10-CM | POA: Diagnosis not present

## 2018-05-13 ENCOUNTER — Other Ambulatory Visit: Payer: Self-pay

## 2018-05-13 ENCOUNTER — Telehealth: Payer: Self-pay | Admitting: Physician Assistant

## 2018-05-13 DIAGNOSIS — Z1211 Encounter for screening for malignant neoplasm of colon: Secondary | ICD-10-CM

## 2018-05-13 NOTE — Telephone Encounter (Signed)
Moved her appointment for follow up per her request to 05/21/18. She is due a screening colonoscopy also. She states she is on chronic pain medications and has a tendency towards slow bowels.  Scheduled for the screening colonoscopy at [facility] on 06/21/18. She will get her prep instructions at her office appointment. Referral already sent to coordinator.

## 2018-05-13 NOTE — Telephone Encounter (Signed)
Pt would like to speak with you, she did not want to give me further details.

## 2018-05-14 DIAGNOSIS — R070 Pain in throat: Secondary | ICD-10-CM | POA: Diagnosis not present

## 2018-05-14 DIAGNOSIS — K219 Gastro-esophageal reflux disease without esophagitis: Secondary | ICD-10-CM | POA: Insufficient documentation

## 2018-05-19 ENCOUNTER — Telehealth: Payer: Self-pay | Admitting: Physician Assistant

## 2018-05-19 ENCOUNTER — Ambulatory Visit: Payer: Self-pay | Admitting: Physician Assistant

## 2018-05-19 NOTE — Telephone Encounter (Signed)
Pt requested a CB regarding her appt.  She has some concerns due to her hx of infection.

## 2018-05-19 NOTE — Telephone Encounter (Signed)
Patient reports she is doing well. No abdominal pain. She is having bowel movements and does not feel constipated. She will call us if she has any concerns or issues.

## 2018-05-20 ENCOUNTER — Encounter: Payer: Medicare Other | Admitting: Psychology

## 2018-05-21 ENCOUNTER — Ambulatory Visit: Payer: Self-pay | Admitting: Physician Assistant

## 2018-05-25 ENCOUNTER — Ambulatory Visit: Payer: Medicare Other

## 2018-05-25 ENCOUNTER — Encounter (INDEPENDENT_AMBULATORY_CARE_PROVIDER_SITE_OTHER): Payer: Self-pay | Admitting: Orthopaedic Surgery

## 2018-06-04 ENCOUNTER — Telehealth: Payer: Self-pay

## 2018-06-04 NOTE — Telephone Encounter (Signed)
-----   Message from Mauri Pole, MD sent at 06/02/2018 11:54 AM EDT ----- We need to delay the procedure scheduled for 4/20 to a later date due to [place] -19.  Thanks VN

## 2018-06-04 NOTE — Telephone Encounter (Signed)
Attempted to contact the patient. No answer. Left a detailed voicemail on her self identifying voicemail. Explained the plan. Procedures canceled.

## 2018-06-04 NOTE — Telephone Encounter (Signed)
Pt verified that she has received your voicemail and understands that her appt has been canceled.

## 2018-06-08 ENCOUNTER — Telehealth: Payer: Self-pay | Admitting: Physical Therapy

## 2018-06-08 NOTE — Telephone Encounter (Signed)
Courtney [name] was contacted today regarding the temporary reduction of OP Rehab Services due to concerns for community transmission of Covid-19.    Therapist advised the patient to continue to perform their HEP and assured they had no unanswered questions at this time.  The patient was offered and declined the continuation in their POC by using methods such as an e-visit, virtual check in, or telehealth visit.    Outpatient Rehabilitation Services will follow up with this client when we are able to safely resume care at the [facility] in person.   Patient is aware we can be reached by telephone during limited business hours in the meantime.

## 2018-06-09 DIAGNOSIS — E1140 Type 2 diabetes mellitus with diabetic neuropathy, unspecified: Secondary | ICD-10-CM | POA: Diagnosis not present

## 2018-06-09 DIAGNOSIS — M79606 Pain in leg, unspecified: Secondary | ICD-10-CM | POA: Diagnosis not present

## 2018-06-09 DIAGNOSIS — M545 Low back pain: Secondary | ICD-10-CM | POA: Diagnosis not present

## 2018-06-09 DIAGNOSIS — M79672 Pain in left foot: Secondary | ICD-10-CM | POA: Diagnosis not present

## 2018-06-09 DIAGNOSIS — G894 Chronic pain syndrome: Secondary | ICD-10-CM | POA: Diagnosis not present

## 2018-06-09 DIAGNOSIS — M542 Cervicalgia: Secondary | ICD-10-CM | POA: Diagnosis not present

## 2018-06-09 DIAGNOSIS — Z79891 Long term (current) use of opiate analgesic: Secondary | ICD-10-CM | POA: Diagnosis not present

## 2018-06-09 DIAGNOSIS — M797 Fibromyalgia: Secondary | ICD-10-CM | POA: Diagnosis not present

## 2018-06-09 DIAGNOSIS — M25519 Pain in unspecified shoulder: Secondary | ICD-10-CM | POA: Diagnosis not present

## 2018-06-17 ENCOUNTER — Telehealth: Payer: Self-pay | Admitting: Physical Therapy

## 2018-06-17 NOTE — Telephone Encounter (Signed)
Left message on mobile number, home VM not set up. Advised of June 1st open date as of now and requested a call back to discuss HEP.  Xzaiver Vayda C. Tashi Andujo PT, DPT 06/17/18 2:34 PM

## 2018-06-21 ENCOUNTER — Encounter (HOSPITAL_COMMUNITY): Admission: RE | Payer: Self-pay | Source: Home / Self Care

## 2018-06-21 ENCOUNTER — Ambulatory Visit (HOSPITAL_COMMUNITY): Admission: RE | Admit: 2018-06-21 | Payer: Medicare Other | Source: Home / Self Care | Admitting: Gastroenterology

## 2018-06-21 SURGERY — COLONOSCOPY WITH PROPOFOL
Anesthesia: Monitor Anesthesia Care

## 2018-07-01 DIAGNOSIS — R070 Pain in throat: Secondary | ICD-10-CM | POA: Diagnosis not present

## 2018-07-01 DIAGNOSIS — J324 Chronic pansinusitis: Secondary | ICD-10-CM | POA: Diagnosis not present

## 2018-07-01 DIAGNOSIS — Z87891 Personal history of nicotine dependence: Secondary | ICD-10-CM | POA: Diagnosis not present

## 2018-07-01 DIAGNOSIS — K219 Gastro-esophageal reflux disease without esophagitis: Secondary | ICD-10-CM | POA: Diagnosis not present

## 2018-07-07 DIAGNOSIS — M25519 Pain in unspecified shoulder: Secondary | ICD-10-CM | POA: Diagnosis not present

## 2018-07-07 DIAGNOSIS — Z79891 Long term (current) use of opiate analgesic: Secondary | ICD-10-CM | POA: Diagnosis not present

## 2018-07-07 DIAGNOSIS — M79606 Pain in leg, unspecified: Secondary | ICD-10-CM | POA: Diagnosis not present

## 2018-07-07 DIAGNOSIS — M79672 Pain in left foot: Secondary | ICD-10-CM | POA: Diagnosis not present

## 2018-07-07 DIAGNOSIS — G894 Chronic pain syndrome: Secondary | ICD-10-CM | POA: Diagnosis not present

## 2018-07-07 DIAGNOSIS — E1140 Type 2 diabetes mellitus with diabetic neuropathy, unspecified: Secondary | ICD-10-CM | POA: Diagnosis not present

## 2018-07-07 DIAGNOSIS — M545 Low back pain: Secondary | ICD-10-CM | POA: Diagnosis not present

## 2018-07-07 DIAGNOSIS — M797 Fibromyalgia: Secondary | ICD-10-CM | POA: Diagnosis not present

## 2018-07-07 DIAGNOSIS — M542 Cervicalgia: Secondary | ICD-10-CM | POA: Diagnosis not present

## 2018-07-08 ENCOUNTER — Telehealth: Payer: Self-pay

## 2018-07-08 NOTE — Telephone Encounter (Signed)
Left a message for the patient to call back. I need to know if she is ready to have her screening colonoscopy at the hospital rescheduled. The date has not been decided.

## 2018-07-08 NOTE — Telephone Encounter (Signed)
Patient returning call and states she is ready to reschedule her procedure at the [facility].

## 2018-07-21 ENCOUNTER — Telehealth: Payer: Self-pay | Admitting: Orthopaedic Surgery

## 2018-07-21 NOTE — Telephone Encounter (Signed)
Patient states physical therapy is now scheduling appts, but they need a new referral for lt shoulder.  Patient also states pain now seems to be going into bicep. Patient thinks she may have swelling in arm down to elbow now. Patient is having to lift arm up with other hand. Please call to advise.

## 2018-07-21 NOTE — Telephone Encounter (Signed)
Please advise 

## 2018-07-21 NOTE — Telephone Encounter (Signed)
If she had relief with the previous injection in march then we could repeat injection and then do PT.  Or could do MRI to delineate pathology

## 2018-07-22 ENCOUNTER — Other Ambulatory Visit: Payer: Self-pay

## 2018-07-22 ENCOUNTER — Ambulatory Visit (INDEPENDENT_AMBULATORY_CARE_PROVIDER_SITE_OTHER): Payer: Medicare Other | Admitting: Orthopedic Surgery

## 2018-07-22 ENCOUNTER — Encounter: Payer: Self-pay | Admitting: Orthopedic Surgery

## 2018-07-22 ENCOUNTER — Ambulatory Visit: Payer: Medicare Other | Admitting: Orthopedic Surgery

## 2018-07-22 ENCOUNTER — Ambulatory Visit: Payer: Self-pay

## 2018-07-22 VITALS — BP 141/84 | HR 71 | Resp 13 | Ht 63.0 in | Wt 157.0 lb

## 2018-07-22 DIAGNOSIS — M542 Cervicalgia: Secondary | ICD-10-CM

## 2018-07-22 DIAGNOSIS — M79602 Pain in left arm: Secondary | ICD-10-CM | POA: Diagnosis not present

## 2018-07-22 DIAGNOSIS — M25512 Pain in left shoulder: Secondary | ICD-10-CM | POA: Diagnosis not present

## 2018-07-22 DIAGNOSIS — M5030 Other cervical disc degeneration, unspecified cervical region: Secondary | ICD-10-CM

## 2018-07-22 DIAGNOSIS — M7502 Adhesive capsulitis of left shoulder: Secondary | ICD-10-CM

## 2018-07-22 DIAGNOSIS — M5412 Radiculopathy, cervical region: Secondary | ICD-10-CM | POA: Diagnosis not present

## 2018-07-22 MED ORDER — LIDOCAINE HCL 2 % IJ SOLN
2.0000 mL | INTRAMUSCULAR | Status: AC | PRN
  Administered 2018-07-22: 13:00:00 2 mL

## 2018-07-22 MED ORDER — METHYLPREDNISOLONE ACETATE 40 MG/ML IJ SUSP
80.0000 mg | INTRAMUSCULAR | Status: AC | PRN
  Administered 2018-07-22: 80 mg via INTRA_ARTICULAR

## 2018-07-22 MED ORDER — BUPIVACAINE HCL 0.25 % IJ SOLN
2.0000 mL | INTRAMUSCULAR | Status: AC | PRN
  Administered 2018-07-22: 13:00:00 2 mL via INTRA_ARTICULAR

## 2018-07-22 NOTE — Progress Notes (Signed)
Office Visit Note   Patient: Courtney [name]           Date of Birth: [birth year]           MRN: [id number] Visit Date: 07/22/2018              Requested by: Courtney Lei, MD [address] [place], [place] [address] PCP: Courtney Lei, MD   Assessment & Plan: Visit Diagnoses:  1. Neck pain   2. Radiculopathy, cervical region   3. Adhesive capsulitis of left shoulder   4. Acute pain of left shoulder   5. Other cervical disc degeneration, unspecified cervical region   6. Arm pain, diffuse, left     Plan:  #1: Corticosteroid steroid injection intra-articularly and subacromially was performed.  She did not have the same response as previously noted in her last injection. #2: I am concerned that she may have rotator cuff pathology and were going to schedule her for MRI scan of the left shoulder to rule out rotator cuff tear #3: With her symptoms of paresthesias in both hands and arms as well as limited range of motion and the radiographic findings I also want to perform a cervical spine MRI scan. #4: She is also ready on narcotics.  Follow-Up Instructions: Return in about 2 weeks (around 08/05/2018), or Courtney [name].   Orders:  Orders Placed This Encounter  Procedures  . Large Joint Inj  . XR Cervical Spine 2 or 3 views  . MR Cervical Spine w/o contrast  . MR Shoulder Left w/o contrast   No orders of the defined types were placed in this encounter.     Procedures: Large Joint Inj: L subacromial bursa (And intra-articular) on 07/22/2018 1:01 PM Indications: pain and diagnostic evaluation Details: 25 G 1.5 in needle, posterior approach  Arthrogram: No  Medications: 2 mL lidocaine 2 %; 80 mg methylPREDNISolone acetate 40 MG/ML; 2 mL bupivacaine 0.25 % Procedure, treatment alternatives, risks and benefits explained, specific risks discussed. Consent was given by the patient (We discussed about her allergy to prednisone and she states that the cortisone injections are not  a problem.). Immediately prior to procedure a time out was called to verify the correct patient, procedure, equipment, support staff and site/side marked as required. Patient was prepped and draped in the usual sterile fashion.       Clinical Data: No additional findings.   Subjective: Chief Complaint  Patient presents with  . Left Shoulder - Pain    HPI  Courtney [name] is a 63 year old African-American female who presents with recurrent left shoulder pain and upper arm pain with some radicular type symptoms into the forearm.  She had developed pain in the left shoulder for about 10 years now.  She was seen on April 22, 2018 with a significant case of severe arthrofibrosis and shoulder pain on the left.  Intra-articular and subacromial injection was given and 2 weeks later had noticed improvement in her shoulder.  She had significant relief in her pain.  Her motion also improved.  Physical therapy was delayed secondary to COVID 19.She returns today now with recurrent she returns today now with recurrent pain not only in the shoulder also in the biceps muscle and tendon as well as some forearm pain.  Review of Systems  Constitutional: Negative for chills and fatigue.  HENT: Negative for sinus pressure and sneezing.   Eyes: Negative for pain and redness.  Respiratory: Negative for shortness of breath and wheezing.  Cardiovascular: Negative for chest pain.  Gastrointestinal: Negative for abdominal pain, constipation and diarrhea.  Endocrine: Negative for polyuria.  Genitourinary: Negative for pelvic pain and urgency.  Musculoskeletal: Negative for back pain and neck pain.  Skin: Negative for rash.  Allergic/Immunologic: Negative for immunocompromised state.  Neurological: Positive for headaches. Negative for dizziness and weakness.  Hematological: Does not bruise/bleed easily.  Psychiatric/Behavioral: Negative for confusion. The patient is not nervous/anxious.      Objective: Vital  Signs: BP (!) 141/84 (BP Location: Right Arm, Patient Position: Sitting, Cuff Size: Normal)   Pulse 71   Resp 13   Ht 5\' 3"  (1.6 m)   Wt 157 lb (71.2 kg)   BMI 27.81 kg/m   Physical Exam Constitutional:      Appearance: She is well-developed.  Eyes:     Pupils: Pupils are equal, round, and reactive to light.  Pulmonary:     Effort: Pulmonary effort is normal.  Skin:    General: Skin is warm and dry.  Neurological:     Mental Status: She is alert and oriented to person, place, and time.  Psychiatric:        Behavior: Behavior normal.     Ortho Exam  Exam today reveals only about 30 to 45 degrees of abduction and forward flexion before she has severe pain to resist.  She also has pain with internal and external rotation at that level.  Tender diffusely about the shoulder as well as the biceps tendon.  She has complaints of pain with any type of motion of the elbow and wrist. Cervical spine reveals about right and left rotation to about 35 to 40 degrees.  Backward extension is also limited.  Cannot touch her chin to her chest.  She has weakness in grip bilaterally more on the left than on the right.  She has diffuse weakness in the both upper extremities.  Specialty Comments:  No specialty comments available.  Imaging: Xr Cervical Spine 2 Or 3 Views  Result Date: 07/22/2018 2 view x-ray of the cervical spine reveals marked anterior spurring C2-3, C4-5, 5-6,6-7 as well as C7-T1.  Dish pattern.  X-rays also reviewed by Courtney. Estanislado [name].    PMFS History: Current Outpatient Medications  Medication Sig Dispense Refill  . albuterol (PROVENTIL HFA;VENTOLIN HFA) 108 (90 Base) MCG/ACT inhaler Inhale 2 puffs into the lungs every 6 (six) hours as needed for wheezing or shortness of breath. 3 Inhaler 3  . alprazolam (XANAX) 2 MG tablet Take 2 mg by mouth 3 (three) times daily as needed for anxiety.     Marland Kitchen amLODipine (NORVASC) 5 MG tablet TK 1 T PO  HS    . butalbital-acetaminophen-caffeine  (FIORICET, ESGIC) 50-325-40 MG tablet Take 1 tablet by mouth 2 (two) times daily as needed for headache.     . empagliflozin (JARDIANCE) 10 MG TABS tablet Take 10 mg by mouth daily.    . insulin aspart protamine- aspart (NOVOLOG MIX 70/30) (70-30) 100 UNIT/ML injection Inject 18-20 Units into the skin 2 (two) times daily. Inject into skin 18 unit with breakfast, and 20 units with supper daily.    Marland Kitchen lidocaine (XYLOCAINE) 5 % ointment Apply 1 application topically daily as needed for mild pain.    . Multiple Vitamin (MULTIVITAMIN) tablet Take 1 tablet by mouth daily.    . naloxone (NARCAN) nasal spray 4 mg/0.1 mL Place 1 spray into the nose daily as needed (overdose).    [name] AD 4-10-325 MG TABS Take 1 tablet  by mouth 4 (four) times daily.    . ondansetron (ZOFRAN ODT) 4 MG disintegrating tablet Take 1 tablet (4 mg total) by mouth every 6 (six) hours as needed for nausea or vomiting. 30 tablet 1  . oxyCODONE (OXY IR/ROXICODONE) 5 MG immediate release tablet Take 20 mg by mouth every 4 (four) hours as needed for severe pain.    Marland Kitchen oxyCODONE (OXYCONTIN) 10 mg 12 hr tablet Take 10 mg by mouth every 12 (twelve) hours.   0  . Oxycodone HCl 20 MG TABS TK 1 T PO  Q 4 H PRN    . pantoprazole (PROTONIX) 40 MG tablet Take 40 mg by mouth daily as needed (acid reflux).     . ciprofloxacin (CIPRO) 500 MG tablet Take 1 tablet (500 mg total) by mouth 2 (two) times daily. Take for 10 days. (Patient not taking: Reported on 07/22/2018) 20 tablet 0  . lubiprostone (AMITIZA) 24 MCG capsule Take 1 capsule (24 mcg total) by mouth 2 (two) times daily with a meal. (Patient not taking: Reported on 07/22/2018) 60 capsule 6  . metroNIDAZOLE (FLAGYL) 500 MG tablet Take 1 tablet (500 mg total) by mouth 2 (two) times daily. Take for 10 days (Patient not taking: Reported on 07/22/2018) 20 tablet 0   No current facility-administered medications for this visit.     Patient Active Problem List   Diagnosis Date Noted  . Loss of  weight   . RLQ abdominal pain 03/27/2017  . Bloating 03/27/2017  . Abnormal finding on imaging 03/27/2017  . Dilation of biliary tract 03/27/2017  . Nausea 03/27/2017  . Multiple joint pain 03/31/2016  . Essential tremor 03/31/2016  . Numbness in both hands 03/18/2016  . Hypersomnia 11/09/2015  . Diabetes mellitus ([place]) 07/18/2015  . Essential hypertension 07/18/2015  . Abnormal ECG 07/18/2015  . Gait difficulty 02/09/2015  . Fibromyalgia 02/09/2015  . Depression with anxiety 02/09/2015  . Numbness 02/09/2015  . Insomnia 02/09/2015  . Urinary hesitancy 02/09/2015  . Other fatigue 02/09/2015   Past Medical History:  Diagnosis Date  . Abnormal ECG 07/18/2015  . Arthritis   . Asthma   . Bowel obstruction ([place])   . Depression with anxiety 02/09/2015   mild xanax prn  . Diabetes mellitus ([place]) 07/18/2015   type 2  . Diverticulitis   . Dysrhythmia    abnormal ekgs worked up  . Essential tremor 03/31/2016  . Fibromyalgia   . Gait difficulty 02/09/2015  . Gallstones   . GERD (gastroesophageal reflux disease)   . HLD (hyperlipidemia)   . Hypersomnia 11/09/2015  . Hypertension    no meds  . Hyperthyroidism   . Hypothyroidism   . IBS (irritable bowel syndrome)   . Insomnia 02/09/2015  . Migraines   . MS (multiple sclerosis) ([place])   . Multiple joint pain 03/31/2016  . Numbness in both hands 03/18/2016  . Other fatigue 02/09/2015  . Peptic ulcer   . Pneumonia   . PONV (postoperative nausea and vomiting)   . Urinary hesitancy 02/09/2015  . Vision abnormalities     Family History  Problem Relation Age of Onset  . Heart disease Mother   . Kidney disease Mother   . Diabetes Mother   . Other Father 66       murdered  . Multiple sclerosis Daughter   . Multiple sclerosis Other   . Diabetes Sister   . Alcohol abuse Brother        ETOH and marijuana  . Diabetes  Brother   . Diabetes Sister   . Diabetes Brother   . ALS Brother     Past Surgical History:  Procedure Laterality  Date  . ABDOMINAL HYSTERECTOMY  1976   including oophorectomy, due to uterine cancer  . APPENDECTOMY  1974  . CARPAL TUNNEL RELEASE Left    carpal tunnel surgery  . CHOLECYSTECTOMY     2006  . COLON SURGERY     SBO x 2  . EUS N/A 04/09/2017   Procedure: UPPER ENDOSCOPIC ULTRASOUND (EUS) LINEAR;  Surgeon: Milus Banister, MD;  Location: [facility];  Service: Endoscopy;  Laterality: N/A;  . HERNIA REPAIR    . MENISCUS REPAIR Left   . SMALL INTESTINE SURGERY    . toenail removal Right 12/2017   great toenail    Social History   Occupational History  . Occupation: disabled  Tobacco Use  . Smoking status: Former Smoker    Packs/day: 0.25    Years: 5.00    Pack years: 1.25    Types: Cigarettes    Last attempt to quit: 1990    Years since quitting: 30.4  . Smokeless tobacco: Never Used  Substance and Sexual Activity  . Alcohol use: No    Frequency: Never  . Drug use: No  . Sexual activity: Yes

## 2018-07-22 NOTE — Telephone Encounter (Signed)
I called patient, patient wants injection, PT and MRI, patient scheduled.

## 2018-07-27 ENCOUNTER — Encounter: Payer: Self-pay | Admitting: Sports Medicine

## 2018-07-27 ENCOUNTER — Ambulatory Visit (INDEPENDENT_AMBULATORY_CARE_PROVIDER_SITE_OTHER): Payer: Medicare Other | Admitting: Sports Medicine

## 2018-07-27 ENCOUNTER — Other Ambulatory Visit: Payer: Self-pay

## 2018-07-27 VITALS — Temp 97.7°F

## 2018-07-27 DIAGNOSIS — E0842 Diabetes mellitus due to underlying condition with diabetic polyneuropathy: Secondary | ICD-10-CM

## 2018-07-27 DIAGNOSIS — M79675 Pain in left toe(s): Secondary | ICD-10-CM | POA: Diagnosis not present

## 2018-07-27 DIAGNOSIS — M79674 Pain in right toe(s): Secondary | ICD-10-CM | POA: Diagnosis not present

## 2018-07-27 DIAGNOSIS — B351 Tinea unguium: Secondary | ICD-10-CM

## 2018-07-27 DIAGNOSIS — M7662 Achilles tendinitis, left leg: Secondary | ICD-10-CM

## 2018-07-27 NOTE — Progress Notes (Signed)
Subjective: Courtney [name] is a 63 y.o. female patient with history of diabetes who presents to office today complaining of long,mildly painful nails  while ambulating in shoes; unable to trim. Reports that her Right 1st toenail broke 1 week ago and is barely hanging on; Got it caught in the closet. Denies any pain and admits history of previous nail surgery and the toe nail growing out with a weird line. Patient also admits an episode of her feet "feeling like they are on fire" better now but had episodes last week of her feet feeling this way with inability to sleep. Patient also admits to history of achilles tendonitis on left and states that her currently brace that she uses is uncomfortable. Patient states that the glucose reading yesterday was 110  Last A1c 6.2. No other issues.  Review of Systems  Musculoskeletal: Positive for joint pain.  All other systems reviewed and are negative.    Patient Active Problem List   Diagnosis Date Noted  . Loss of weight   . RLQ abdominal pain 03/27/2017  . Bloating 03/27/2017  . Abnormal finding on imaging 03/27/2017  . Dilation of biliary tract 03/27/2017  . Nausea 03/27/2017  . Multiple joint pain 03/31/2016  . Essential tremor 03/31/2016  . Numbness in both hands 03/18/2016  . Hypersomnia 11/09/2015  . Diabetes mellitus ([place]) 07/18/2015  . Essential hypertension 07/18/2015  . Abnormal ECG 07/18/2015  . Gait difficulty 02/09/2015  . Fibromyalgia 02/09/2015  . Depression with anxiety 02/09/2015  . Numbness 02/09/2015  . Insomnia 02/09/2015  . Urinary hesitancy 02/09/2015  . Other fatigue 02/09/2015   Current Outpatient Medications on File Prior to Visit  Medication Sig Dispense Refill  . albuterol (PROVENTIL HFA;VENTOLIN HFA) 108 (90 Base) MCG/ACT inhaler Inhale 2 puffs into the lungs every 6 (six) hours as needed for wheezing or shortness of breath. 3 Inhaler 3  . alprazolam (XANAX) 2 MG tablet Take 2 mg by mouth 3 (three) times  daily as needed for anxiety.     Marland Kitchen amLODipine (NORVASC) 5 MG tablet TK 1 T PO  HS    . empagliflozin (JARDIANCE) 10 MG TABS tablet Take 10 mg by mouth daily.    . insulin aspart protamine- aspart (NOVOLOG MIX 70/30) (70-30) 100 UNIT/ML injection Inject 18-20 Units into the skin 2 (two) times daily. Inject into skin 18 unit with breakfast, and 20 units with supper daily.    Marland Kitchen lidocaine (XYLOCAINE) 5 % ointment Apply 1 application topically daily as needed for mild pain.    . Multiple Vitamin (MULTIVITAMIN) tablet Take 1 tablet by mouth daily.    . naloxone (NARCAN) nasal spray 4 mg/0.1 mL Place 1 spray into the nose daily as needed (overdose).    [name] AD 4-10-325 MG TABS Take 1 tablet by mouth 4 (four) times daily.    Marland Kitchen oxyCODONE (OXY IR/ROXICODONE) 5 MG immediate release tablet Take 20 mg by mouth every 4 (four) hours as needed for severe pain.    Marland Kitchen oxyCODONE (OXYCONTIN) 10 mg 12 hr tablet Take 10 mg by mouth every 12 (twelve) hours.   0  . Oxycodone HCl 20 MG TABS TK 1 T PO  Q 4 H PRN    . pantoprazole (PROTONIX) 40 MG tablet Take 40 mg by mouth daily as needed (acid reflux).     . butalbital-acetaminophen-caffeine (FIORICET, ESGIC) 50-325-40 MG tablet Take 1 tablet by mouth 2 (two) times daily as needed for headache.     Marland Kitchen  ciprofloxacin (CIPRO) 500 MG tablet Take 1 tablet (500 mg total) by mouth 2 (two) times daily. Take for 10 days. (Patient not taking: Reported on 07/27/2018) 20 tablet 0  . lubiprostone (AMITIZA) 24 MCG capsule Take 1 capsule (24 mcg total) by mouth 2 (two) times daily with a meal. (Patient not taking: Reported on 07/27/2018) 60 capsule 6  . metroNIDAZOLE (FLAGYL) 500 MG tablet Take 1 tablet (500 mg total) by mouth 2 (two) times daily. Take for 10 days (Patient not taking: Reported on 07/27/2018) 20 tablet 0  . ondansetron (ZOFRAN ODT) 4 MG disintegrating tablet Take 1 tablet (4 mg total) by mouth every 6 (six) hours as needed for nausea or vomiting. (Patient not taking: Reported  on 07/27/2018) 30 tablet 1   No current facility-administered medications on file prior to visit.    Allergies  Allergen Reactions  . Ibuprofen Other (See Comments)    Developed a ulcer  Other reaction(s): Other (See Comments) Developed a ulcer   . Prednisone Anaphylaxis, Shortness Of Breath and Palpitations  . Solu-Medrol [Methylprednisolone] Anaphylaxis, Shortness Of Breath and Palpitations  . Bactrim [Sulfamethoxazole-Trimethoprim] Other (See Comments)    "blotchiness" redness, face swelling  . Biaxin [Clarithromycin] Swelling  . Naproxen Other (See Comments)    Developed a ulcer   . Sulfa Antibiotics Other (See Comments)    Other reaction(s): Angioedema Other reaction(s): Angioedema    No results found for this or any previous visit (from the past 2160 hour(s)).  Objective: General: Patient is awake, alert, and oriented x 3 and in no acute distress.  Integument: Skin is warm, dry and supple bilateral. Nails are tender, long, thickened and  dystrophic with subungual debris, consistent with onychomycosis, 1-5 bilateral with lysis distally at right 1st toenail with trauma line to nail. No signs of infection. No open lesions or preulcerative lesions present bilateral. Old scar to right foot. Remaining integument unremarkable.  Vasculature:  Dorsalis Pedis pulse 2/4 bilateral. Posterior Tibial pulse  1/4 bilateral.  Capillary fill time <3 sec 1-5 bilateral. Positive hair growth to the level of the digits. Temperature gradient within normal limits. No varicosities present bilateral. No edema present bilateral.   Neurology: The patient has intact sensation measured with a 5.07/10g Semmes Weinstein Monofilament at all pedal sites bilateral . Vibratory sensation diminished bilateral with tuning fork. No Babinski sign present bilateral.   Musculoskeletal: Mild pain at achilles insertion on left.. Muscular strength 5/5 in all lower extremity muscular groups bilateral except ankles without  additional pain on range of motion . No tenderness with calf compression bilateral.  Assessment and Plan: Problem List Items Addressed This Visit    None    Visit Diagnoses    Pain due to onychomycosis of toenails of both feet    -  Primary   Diabetic polyneuropathy associated with diabetes mellitus due to underlying condition (HCC)       Tendonitis, Achilles, left          -Examined patient. -Discussed and educated patient on diabetic foot care, especially with  regards to the vascular, neurological and musculoskeletal systems.  -Stressed the importance of good glycemic control and the detriment of not  controlling glucose levels in relation to the foot. -Mechanically debrided all nails 1-5 bilateral using sterile nail nipper and filed with dremel without incident  -Advised patient to give right 1st toenail more time to grow out -Rx toe cap for R 1st toe -Advised patient to discuss with PCP about increasing Lyrica to help with  neuropathy -Recommend daily stretching and icing and gentle stretching to prevent worsening of tendonitis -Rx Night splint -Answered all patient questions -Patient to return  in 3 months for at risk foot care -Patient advised to call the office if any problems or questions arise in the meantime.  Landis Martins, DPM

## 2018-07-28 ENCOUNTER — Telehealth: Payer: Self-pay | Admitting: Orthopaedic Surgery

## 2018-07-28 NOTE — Telephone Encounter (Signed)
Patient called stating the injection only worked for one day.  Patient states she has an MRI and follow-up appointment scheduled and is requesting a return call to let her know if she should be wearing a shoulder splint.

## 2018-07-28 NOTE — Telephone Encounter (Signed)
I called patient 

## 2018-07-28 NOTE — Telephone Encounter (Signed)
No splint-could try sling.Await MRI results to determine future treatment

## 2018-07-28 NOTE — Telephone Encounter (Signed)
Please advise 

## 2018-08-05 ENCOUNTER — Ambulatory Visit
Admission: RE | Admit: 2018-08-05 | Discharge: 2018-08-05 | Disposition: A | Discharge status: Home / Self Care | Payer: Medicare Other | Source: Ambulatory Visit | Attending: Orthopedic Surgery | Admitting: Orthopedic Surgery

## 2018-08-05 ENCOUNTER — Other Ambulatory Visit: Payer: Self-pay

## 2018-08-05 ENCOUNTER — Ambulatory Visit: Payer: Medicare Other | Admitting: Orthopaedic Surgery

## 2018-08-05 ENCOUNTER — Ambulatory Visit: Payer: Self-pay | Admitting: Psychology

## 2018-08-05 DIAGNOSIS — M4802 Spinal stenosis, cervical region: Secondary | ICD-10-CM | POA: Diagnosis not present

## 2018-08-05 DIAGNOSIS — S46012A Strain of muscle(s) and tendon(s) of the rotator cuff of left shoulder, initial encounter: Secondary | ICD-10-CM | POA: Diagnosis not present

## 2018-08-05 DIAGNOSIS — M5030 Other cervical disc degeneration, unspecified cervical region: Secondary | ICD-10-CM

## 2018-08-05 DIAGNOSIS — M25512 Pain in left shoulder: Secondary | ICD-10-CM

## 2018-08-10 ENCOUNTER — Ambulatory Visit (INDEPENDENT_AMBULATORY_CARE_PROVIDER_SITE_OTHER): Payer: Medicare Other | Admitting: Orthopaedic Surgery

## 2018-08-10 ENCOUNTER — Encounter: Payer: Self-pay | Admitting: Orthopaedic Surgery

## 2018-08-10 ENCOUNTER — Other Ambulatory Visit: Payer: Self-pay

## 2018-08-10 ENCOUNTER — Ambulatory Visit: Payer: Medicare Other | Admitting: Orthopaedic Surgery

## 2018-08-10 VITALS — BP 117/86 | HR 73 | Ht 63.0 in | Wt 154.0 lb

## 2018-08-10 DIAGNOSIS — M25512 Pain in left shoulder: Secondary | ICD-10-CM | POA: Diagnosis not present

## 2018-08-10 DIAGNOSIS — M542 Cervicalgia: Secondary | ICD-10-CM | POA: Diagnosis not present

## 2018-08-10 DIAGNOSIS — M7502 Adhesive capsulitis of left shoulder: Secondary | ICD-10-CM | POA: Diagnosis not present

## 2018-08-10 DIAGNOSIS — G8929 Other chronic pain: Secondary | ICD-10-CM | POA: Diagnosis not present

## 2018-08-10 NOTE — Telephone Encounter (Signed)
Patient wants to wait until after the pandemic.

## 2018-08-10 NOTE — Progress Notes (Signed)
Office Visit Note   Patient: Courtney [name]           Date of Birth: 1956-01-31           MRN: [id number] Visit Date: 08/10/2018              Requested by: Lucianne Lei, MD [address] [place], [place] [address] PCP: Lucianne Lei, MD   Assessment & Plan: Visit Diagnoses:  1. Cervicalgia   2. Chronic left shoulder pain   3. Adhesive capsulitis of left shoulder     Plan: MRI scan of cervical spine demonstrates overall stable appearing broad-based disc osteophyte complex at C4-5 with spinal and right foraminal stenosis.  There is a new focal central disc protrusion at C3-4 with focal mass-effect on the ventral thecal sac and narrowing of the ventral CSF space.  No foraminal stenosis at the level. Also had an MRI scan of the left shoulder demonstrating supraspinatus tendinopathy with a fairly extensive oblique coursing inferior articular surface and interstitial tear.  There is also a small full-thickness tear involving the anterior attachment fibers.  Mild AC joint degenerative changes with a type II acromium and significant lateral downsloping.  Biceps tendon was intact.  There was thickening of the capsular structures consistent with adhesive capsulitis. Long discussion with [name] reguarding the above.  I think she should have a course of physical therapy for her cervical spine.  We also discussed therapy for her shoulder but she would prefer to consider manipulation.  Certainly that may not alleviate all of her shoulder pain but I think given all the comorbidities and history of significant fibromyalgia that an open procedure may actually aggravate the adhesive capsulitis.  Therefore I think it is worth trying to resolve the adhesive capsulitis and treat the rotator cuff tear conservatively with therapy after manipulation Follow-Up Instructions: No follow-ups on file.   Orders:  No orders of the defined types were placed in this encounter.  No orders of the defined  types were placed in this encounter.     Procedures: No procedures performed   Clinical Data: No additional findings.   Subjective: Chief Complaint  Patient presents with   Neck - Follow-up   Left Shoulder - Follow-up  Patient presents today for a follow up on her neck and left shoulder pain. She had an MRI on 08/05/18 and is here today for those results.She had a cortisone injection on 07/22/18 in her left shoulder. The injection has helped some. She is taking oxycontin as prescribed by pain management.  No symptom change in regards to her neck or her shoulder since her last evaluation  HPI  Review of Systems  Constitutional: Negative for fatigue.  HENT: Negative for ear pain.   Eyes: Negative for pain.  Respiratory: Negative for shortness of breath.   Cardiovascular: Negative for leg swelling.  Gastrointestinal: Positive for constipation. Negative for diarrhea.  Endocrine: Negative for cold intolerance and heat intolerance.  Genitourinary: Negative for difficulty urinating.  Musculoskeletal: Positive for joint swelling.  Skin: Negative for rash.  Allergic/Immunologic: Negative for food allergies.  Neurological: Positive for weakness.  Hematological: Does not bruise/bleed easily.  Psychiatric/Behavioral: Positive for sleep disturbance.     Objective: Vital Signs: BP 117/86    Pulse 73    Ht 5\' 3"  (1.6 m)    Wt 154 lb (69.9 kg)    BMI 27.28 kg/m   Physical Exam Constitutional:      Appearance: She is well-developed.  Eyes:     Pupils: Pupils are equal, round, and reactive to light.  Pulmonary:     Effort: Pulmonary effort is normal.  Skin:    General: Skin is warm and dry.  Neurological:     Mental Status: She is alert and oriented to person, place, and time.  Psychiatric:        Behavior: Behavior normal.     Ortho Exam awake alert and oriented x3.  Comfortable sitting.  Does use a cane to help with her ambulation.  She notes that her fibromyalgia is quite  active.  There was limited range of motion of the cervical spine lacking about 3 fingerbreadths from touching her chin to her chest and only about 40 to 50% of normal neck extension.  At about 60% of normal rotation to the right and to the left.  No motion created pain into either upper extremity but did have some pain in the lower cervical spine.  Considerable loss of motion of the left shoulder with only about 90 degrees of flexion and about 70 degrees of abduction.  Positive impingement and empty can testing and considerable pain just to touch around the shoulder.  Skin otherwise intact.  Good grip and release.  Had multiple areas of tenderness about the arm and wrist  Specialty Comments:  No specialty comments available.  Imaging: No results found.   PMFS History: Patient Active Problem List   Diagnosis Date Noted   Cervicalgia 08/10/2018   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   Loss of weight    RLQ abdominal pain 03/27/2017   Bloating 03/27/2017   Abnormal finding on imaging 03/27/2017   Dilation of biliary tract 03/27/2017   Nausea 03/27/2017   Multiple joint pain 03/31/2016   Essential tremor 03/31/2016   Numbness in both hands 03/18/2016   Hypersomnia 11/09/2015   Diabetes mellitus ([place]) 07/18/2015   Essential hypertension 07/18/2015   Abnormal ECG 07/18/2015   Gait difficulty 02/09/2015   Chronic left shoulder pain 02/09/2015   Depression with anxiety 02/09/2015   Numbness 02/09/2015   Insomnia 02/09/2015   Urinary hesitancy 02/09/2015   Other fatigue 02/09/2015   Past Medical History:  Diagnosis Date   Abnormal ECG 07/18/2015   Arthritis    Asthma    Bowel obstruction ([place])    Depression with anxiety 02/09/2015   mild xanax prn   Diabetes mellitus ([place]) 07/18/2015   type 2   Diverticulitis    Dysrhythmia    abnormal ekgs worked up   Essential tremor 03/31/2016   Fibromyalgia    Gait difficulty 02/09/2015    Gallstones    GERD (gastroesophageal reflux disease)    HLD (hyperlipidemia)    Hypersomnia 11/09/2015   Hypertension    no meds   Hyperthyroidism    Hypothyroidism    IBS (irritable bowel syndrome)    Insomnia 02/09/2015   Migraines    MS (multiple sclerosis) ([place])    Multiple joint pain 03/31/2016   Numbness in both hands 03/18/2016   Other fatigue 02/09/2015   Peptic ulcer    Pneumonia    PONV (postoperative nausea and vomiting)    Urinary hesitancy 02/09/2015   Vision abnormalities     Family History  Problem Relation Age of Onset   Heart disease Mother    Kidney disease Mother    Diabetes Mother    Other Father 44       murdered   Multiple sclerosis Daughter    Multiple sclerosis Other  Diabetes Sister    Alcohol abuse Brother        ETOH and marijuana   Diabetes Brother    Diabetes Sister    Diabetes Brother    ALS Brother     Past Surgical History:  Procedure Laterality Date   ABDOMINAL HYSTERECTOMY  1976   including oophorectomy, due to uterine cancer   APPENDECTOMY  1974   CARPAL TUNNEL RELEASE Left    carpal tunnel surgery   CHOLECYSTECTOMY     2006   COLON SURGERY     SBO x 2   EUS N/A 04/09/2017   Procedure: UPPER ENDOSCOPIC ULTRASOUND (EUS) LINEAR;  Surgeon: Milus Banister, MD;  Location: [facility];  Service: Endoscopy;  Laterality: N/A;   HERNIA REPAIR     MENISCUS REPAIR Left    SMALL INTESTINE SURGERY     toenail removal Right 12/2017   great toenail    Social History   Occupational History   Occupation: disabled  Tobacco Use   Smoking status: Former Smoker    Packs/day: 0.25    Years: 5.00    Pack years: 1.25    Types: Cigarettes    Last attempt to quit: 1990    Years since quitting: 30.4   Smokeless tobacco: Never Used  Substance and Sexual Activity   Alcohol use: No    Frequency: Never   Drug use: No   Sexual activity: Yes

## 2018-08-13 ENCOUNTER — Other Ambulatory Visit: Payer: Self-pay

## 2018-08-13 ENCOUNTER — Encounter: Payer: Self-pay | Admitting: Psychology

## 2018-08-13 ENCOUNTER — Encounter: Payer: Medicare Other | Attending: Psychology | Admitting: Psychology

## 2018-08-13 DIAGNOSIS — F331 Major depressive disorder, recurrent, moderate: Secondary | ICD-10-CM | POA: Diagnosis not present

## 2018-08-13 DIAGNOSIS — M797 Fibromyalgia: Secondary | ICD-10-CM

## 2018-08-13 DIAGNOSIS — G894 Chronic pain syndrome: Secondary | ICD-10-CM | POA: Diagnosis not present

## 2018-08-13 DIAGNOSIS — G35 Multiple sclerosis: Secondary | ICD-10-CM | POA: Diagnosis not present

## 2018-08-13 NOTE — Progress Notes (Addendum)
Neuropsychological Consultation   Patient:   Courtney [name]   DOB:   18-Jan-1956  MR Number:  [id number]  Location:  [facility] [place], [place] [id number] [place] [address] Dept: [phone]           Date of Service:   10/20/2017  Start Time:   10 AM End Time:   11 AM  Provider/Observer:  Ilean Skill, Psy.D.       Clinical Neuropsychologist  TELEHEALTH NOTE  Due to national recommendations of social distancing due to COVID 19, an audio/video telehealth visit is felt to be most appropriate for this patient at this time.  See Chart message from today for the patient's consent to telehealth from [place].     I verified that I am speaking with the correct person using two identifiers.  Location of patient: Home Location of provider: Office Method of communication: Webex with phone for audio and Webex for video. Names of participants :  Patient and myself Established patient Time spent on call: 1 hour       Billing Code/Service: 96158/96159  Chief Complaint:    The patient was referred by Dr. Criss Rosales due to issues with chronic pain.  The patient was diagnosed with MS and fibromyalgia in 2006 she reports that she has been deteriorating since that time.  The patient reports that she has more trouble walking and talking and has significant.  Changes in expressive language functioning and also has episodes of falling.  The patient reports that the pain keeps getting worse.  [name] husband reports that there have been increasing levels of pain over the past several years.  The patient reports that she has had significant issues with depression due to all of her medical issues are out of her control.  The patient feels like she constantly goes from doctors to doctors regarding her difficulties.  She has had a number of  attempts to try to alleviate her pain including trigger point injections.  She has been on numerous medications for pain as well as antidepressants.  She is also participated in physical therapy and occupational therapy her husband joined the gym.  Patient reports that she is very depressed about her current difficulties.    The above chief complaint has been reviewed for this appointment and remains valid and appropriate to the current visit.  The patient continues to struggle with chronic pain depression anxiety but reports that she has been actively working on some of the interventions we have been developing.  The patient has had MRI cervical and shoulder with both finding abnormalities.  Reason for Service:  The patient was referred for psychological/neuropsychological consultation due to coping issues around chronic pain and fibromyalgia as well as depression with anxiety.  Behavioral Observation: Courtney [name]  presents as a 63 y.o.-year-old Right African American Female who appeared her stated age. her dress was Appropriate and she was Well Groomed and her manners were Appropriate to the situation.  her participation was indicative of Appropriate behaviors.  There were any physical disabilities noted.  she displayed an appropriate level of cooperation and motivation.     Interactions:    Active Appropriate  Attention:   within normal limits and attention span and concentration were age appropriate  Memory:   within normal limits; recent and remote memory intact  Visuo-spatial:  within normal limits  Speech (  Volume):  low  Speech:   normal; normal  Thought Process:  Coherent and Relevant  Though Content:  WNL; not suicidal and not homicidal  Orientation:   person, place, time/date and situation  Judgment:   Good  Planning:   Good  Affect:    Anxious and Depressed  Mood:    Dysphoric  Insight:   Good  Intelligence:   normal  Marital Status/Living: The patient is  married and currently lives with her husband who is quite supportive of her.  Current Employment: The patient is disabled.  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  Substance Use:  No concerns of substance abuse are reported.    Education:   HS Graduate  Medical History:   Past Medical History:  Diagnosis Date  . Abnormal ECG 07/18/2015  . Arthritis   . Asthma   . Bowel obstruction ([place])   . Depression with anxiety 02/09/2015   mild xanax prn  . Diabetes mellitus ([place]) 07/18/2015   type 2  . Diverticulitis   . Dysrhythmia    abnormal ekgs worked up  . Essential tremor 03/31/2016  . Fibromyalgia   . Gait difficulty 02/09/2015  . Gallstones   . GERD (gastroesophageal reflux disease)   . HLD (hyperlipidemia)   . Hypersomnia 11/09/2015  . Hypertension    no meds  . Hyperthyroidism   . Hypothyroidism   . IBS (irritable bowel syndrome)   . Insomnia 02/09/2015  . Migraines   . MS (multiple sclerosis) ([place])   . Multiple joint pain 03/31/2016  . Numbness in both hands 03/18/2016  . Other fatigue 02/09/2015  . Peptic ulcer   . Pneumonia   . PONV (postoperative nausea and vomiting)   . Urinary hesitancy 02/09/2015  . Vision abnormalities          Family Med/Psych History:  Family History  Problem Relation Age of Onset  . Heart disease Mother   . Kidney disease Mother   . Diabetes Mother   . Other Father 64       murdered  . Multiple sclerosis Daughter   . Multiple sclerosis Other   . Diabetes Sister   . Alcohol abuse Brother        ETOH and marijuana  . Diabetes Brother   . Diabetes Sister   . Diabetes Brother   . ALS Brother     Risk of Suicide/Violence: low the patient denies any suicidal or homicidal ideation.  Impression/DX:  The patient was referred by Dr. Criss Rosales due to issues with chronic pain.  The patient was diagnosed with MS and fibromyalgia in 2006 she reports that she has been  deteriorating since that time.  The patient reports that she has more trouble walking and talking and has significant.  Changes in expressive language functioning and also has episodes of falling.  The patient reports that the pain keeps getting worse.  [name] husband reports that there have been increasing levels of pain over the past several years.  The patient reports that she has had significant issues with depression due to all of her medical issues are out of her control.  The patient feels like she constantly goes from doctors to doctors regarding her difficulties.  She has had a number of attempts to try to alleviate her pain including trigger point injections.  She has been on numerous medications for pain as well as antidepressants.  She is also participated in physical therapy and occupational therapy her husband joined the gym.  Patient reports that she is very depressed about her current difficulties.    Disposition/Plan:  Today we continued to work on coping skills and strategies around the patient's issues related to her residual effects of MS as well as her fibromyalgia and depression symptoms.  The patient has been working on these issues going forward and we continue to develop coping skills and strategies.          Electronically Signed   _______________________ Ilean Skill, Psy.D.

## 2018-08-16 ENCOUNTER — Ambulatory Visit: Payer: Medicare Other | Admitting: Psychology

## 2018-08-16 DIAGNOSIS — G894 Chronic pain syndrome: Secondary | ICD-10-CM | POA: Diagnosis not present

## 2018-08-16 DIAGNOSIS — M545 Low back pain: Secondary | ICD-10-CM | POA: Diagnosis not present

## 2018-08-16 DIAGNOSIS — M25519 Pain in unspecified shoulder: Secondary | ICD-10-CM | POA: Diagnosis not present

## 2018-08-16 DIAGNOSIS — M542 Cervicalgia: Secondary | ICD-10-CM | POA: Diagnosis not present

## 2018-08-16 DIAGNOSIS — M797 Fibromyalgia: Secondary | ICD-10-CM | POA: Diagnosis not present

## 2018-08-16 DIAGNOSIS — M79672 Pain in left foot: Secondary | ICD-10-CM | POA: Diagnosis not present

## 2018-08-16 DIAGNOSIS — E1140 Type 2 diabetes mellitus with diabetic neuropathy, unspecified: Secondary | ICD-10-CM | POA: Diagnosis not present

## 2018-08-16 DIAGNOSIS — Z79891 Long term (current) use of opiate analgesic: Secondary | ICD-10-CM | POA: Diagnosis not present

## 2018-08-16 DIAGNOSIS — M79606 Pain in leg, unspecified: Secondary | ICD-10-CM | POA: Diagnosis not present

## 2018-08-18 DIAGNOSIS — E1169 Type 2 diabetes mellitus with other specified complication: Secondary | ICD-10-CM | POA: Diagnosis not present

## 2018-08-18 DIAGNOSIS — M797 Fibromyalgia: Secondary | ICD-10-CM | POA: Diagnosis not present

## 2018-08-18 DIAGNOSIS — I10 Essential (primary) hypertension: Secondary | ICD-10-CM | POA: Diagnosis not present

## 2018-08-20 DIAGNOSIS — F064 Anxiety disorder due to known physiological condition: Secondary | ICD-10-CM | POA: Diagnosis not present

## 2018-08-22 ENCOUNTER — Encounter: Payer: Self-pay | Admitting: Orthopaedic Surgery

## 2018-08-26 DIAGNOSIS — M7502 Adhesive capsulitis of left shoulder: Secondary | ICD-10-CM | POA: Diagnosis not present

## 2018-08-26 DIAGNOSIS — M24612 Ankylosis, left shoulder: Secondary | ICD-10-CM | POA: Diagnosis not present

## 2018-08-27 ENCOUNTER — Other Ambulatory Visit: Payer: Self-pay | Admitting: Orthopedic Surgery

## 2018-08-27 ENCOUNTER — Telehealth: Payer: Self-pay | Admitting: Orthopaedic Surgery

## 2018-08-27 ENCOUNTER — Other Ambulatory Visit: Payer: Self-pay | Admitting: *Deleted

## 2018-08-27 DIAGNOSIS — G8929 Other chronic pain: Secondary | ICD-10-CM

## 2018-08-27 DIAGNOSIS — M25512 Pain in left shoulder: Secondary | ICD-10-CM

## 2018-08-27 NOTE — Telephone Encounter (Signed)
Patient called stating she is experiencing "excrutiating pain" in her shoulder.  Patient states the pain is going down her arm and up into her neck.  Patient states she has been using ice and taking Oxycodone, but still in a lot of pain.  Patient states she was given "3 Fentanyl" at the [facility] and they worked and is requesting a prescription "to carry her over the weekend."  Patient requested a return call.

## 2018-08-31 ENCOUNTER — Encounter: Payer: Self-pay | Admitting: Orthopaedic Surgery

## 2018-08-31 ENCOUNTER — Other Ambulatory Visit: Payer: Self-pay

## 2018-08-31 ENCOUNTER — Ambulatory Visit: Payer: Medicare Other | Admitting: Physical Therapy

## 2018-08-31 ENCOUNTER — Ambulatory Visit (INDEPENDENT_AMBULATORY_CARE_PROVIDER_SITE_OTHER): Payer: Medicare Other | Admitting: Orthopaedic Surgery

## 2018-08-31 VITALS — BP 126/89 | HR 64 | Ht 63.0 in | Wt 154.0 lb

## 2018-08-31 DIAGNOSIS — M7502 Adhesive capsulitis of left shoulder: Secondary | ICD-10-CM

## 2018-08-31 NOTE — Progress Notes (Signed)
Office Visit Note   Patient: Courtney [name]           Date of Birth: 10-19-1955           MRN: [id number] Visit Date: 08/31/2018              Requested by: Lucianne Lei, MD [address] [place],  [place] [address] PCP: Lucianne Lei, MD   Assessment & Plan: Visit Diagnoses:  1. Adhesive capsulitis of left shoulder     Plan: 5 days status post closed manipulation of adhesive capsulitis left shoulder.  Doing well.  Did have a fair amount of pain postoperatively.  Has a history of fibromyalgia that is followed in a pain clinic.  Doing well today.  I can passively place her arm fully overhead and even behind her back.  Have set up physical therapy and instructed her on home exercises.  Any pain medicines for the pain clinic.  Follow-up 1 month  Follow-Up Instructions: Return in about 1 month (around 09/30/2018).   Orders:  No orders of the defined types were placed in this encounter.  No orders of the defined types were placed in this encounter.     Procedures: No procedures performed   Clinical Data: No additional findings.   Subjective: Chief Complaint  Patient presents with  . Left Shoulder - Routine Post Op    Left shoulder manipulation DOS 08/26/18  Patient presents today for her left shoulder. She is now five days out from left shoulder manipulation. Her surgery was on 08/26/2018. Patient states that her shoulder is feeling much better. She is taking her regular pain medicine as prescribed by pain management.   HPI  Review of Systems   Objective: Vital Signs: BP 126/89   Pulse 64   Ht 5\' 3"  (1.6 m)   Wt 154 lb (69.9 kg)   BMI 27.28 kg/m   Physical Exam  Ortho Exam left shoulder I could passively place fully overhead and abduct 90 degrees.  She could passively and actively place her arm behind her back to the level of the lumbosacral junction.  No swelling.  No ecchymosis.  Good grip and release  Specialty Comments:  No specialty comments  available.  Imaging: No results found.   PMFS History: Patient Active Problem List   Diagnosis Date Noted  . Cervicalgia 08/10/2018  . 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 Loss of weight   . RLQ abdominal pain 03/27/2017  . Bloating 03/27/2017  . Abnormal finding on imaging 03/27/2017  . Dilation of biliary tract 03/27/2017  . Nausea 03/27/2017  . Multiple joint pain 03/31/2016  . Essential tremor 03/31/2016  . Numbness in both hands 03/18/2016  . Hypersomnia 11/09/2015  . Diabetes mellitus ([place]) 07/18/2015  . Essential hypertension 07/18/2015  . Abnormal ECG 07/18/2015  . Gait difficulty 02/09/2015  . Chronic left shoulder pain 02/09/2015  . Depression with anxiety 02/09/2015  . Numbness 02/09/2015  . Insomnia 02/09/2015  . Urinary hesitancy 02/09/2015  . Other fatigue 02/09/2015   Past Medical History:  Diagnosis Date  . Abnormal ECG 07/18/2015  . Arthritis   . Asthma   . Bowel obstruction ([place])   . Depression with anxiety 02/09/2015   mild xanax prn  . Diabetes mellitus ([place]) 07/18/2015   type 2  . Diverticulitis   . Dysrhythmia    abnormal ekgs worked up  . Essential tremor 03/31/2016  . Fibromyalgia   . Gait difficulty 02/09/2015  .  Gallstones   . GERD (gastroesophageal reflux disease)   . HLD (hyperlipidemia)   . Hypersomnia 11/09/2015  . Hypertension    no meds  . Hyperthyroidism   . Hypothyroidism   . IBS (irritable bowel syndrome)   . Insomnia 02/09/2015  . Migraines   . MS (multiple sclerosis) ([place])   . Multiple joint pain 03/31/2016  . Numbness in both hands 03/18/2016  . Other fatigue 02/09/2015  . Peptic ulcer   . Pneumonia   . PONV (postoperative nausea and vomiting)   . Urinary hesitancy 02/09/2015  . Vision abnormalities     Family History  Problem Relation Age of Onset  . Heart disease Mother   . Kidney disease Mother   . Diabetes Mother   . Other Father 28       murdered  . Multiple sclerosis Daughter   . Multiple  sclerosis Other   . Diabetes Sister   . Alcohol abuse Brother        ETOH and marijuana  . Diabetes Brother   . Diabetes Sister   . Diabetes Brother   . ALS Brother     Past Surgical History:  Procedure Laterality Date  . ABDOMINAL HYSTERECTOMY  1976   including oophorectomy, due to uterine cancer  . APPENDECTOMY  1974  . CARPAL TUNNEL RELEASE Left    carpal tunnel surgery  . CHOLECYSTECTOMY     2006  . COLON SURGERY     SBO x 2  . EUS N/A 04/09/2017   Procedure: UPPER ENDOSCOPIC ULTRASOUND (EUS) LINEAR;  Surgeon: Milus Banister, MD;  Location: [facility];  Service: Endoscopy;  Laterality: N/A;  . HERNIA REPAIR    . MENISCUS REPAIR Left   . SMALL INTESTINE SURGERY    . toenail removal Right 12/2017   great toenail    Social History   Occupational History  . Occupation: disabled  Tobacco Use  . Smoking status: Former Smoker    Packs/day: 0.25    Years: 5.00    Pack years: 1.25    Types: Cigarettes    Quit date: 1990    Years since quitting: 30.5  . Smokeless tobacco: Never Used  Substance and Sexual Activity  . Alcohol use: No    Frequency: Never  . Drug use: No  . Sexual activity: Yes

## 2018-08-31 NOTE — Telephone Encounter (Signed)
Aaron Edelman called patient.

## 2018-09-02 ENCOUNTER — Ambulatory Visit: Payer: Medicare Other | Attending: Orthopaedic Surgery

## 2018-09-02 ENCOUNTER — Other Ambulatory Visit: Payer: Self-pay

## 2018-09-02 DIAGNOSIS — M6281 Muscle weakness (generalized): Secondary | ICD-10-CM | POA: Insufficient documentation

## 2018-09-02 DIAGNOSIS — G8929 Other chronic pain: Secondary | ICD-10-CM | POA: Insufficient documentation

## 2018-09-02 DIAGNOSIS — M25512 Pain in left shoulder: Secondary | ICD-10-CM | POA: Insufficient documentation

## 2018-09-02 DIAGNOSIS — M25612 Stiffness of left shoulder, not elsewhere classified: Secondary | ICD-10-CM

## 2018-09-02 NOTE — Therapy (Signed)
[facility] [place], [place], [address] Phone: [phone]   Fax:  [phone]  Physical Therapy Evaluation  Patient Details  Name: Courtney [name] MRN: [id number] Date of Birth: [birth year] Referring Provider (PT): Joni Fears, MD   Encounter Date: 09/02/2018  PT End of Session - 09/02/18 0857    Visit Number  1    Number of Visits  8    Date for PT Re-Evaluation  10/01/18    Authorization Type  Medicare, Tricare, progress note by 10th visit, KX at 27    PT Start Time  0855    PT Stop Time  0935    PT Time Calculation (min)  40 min    Activity Tolerance  Patient tolerated treatment well    Behavior During Therapy  [facility] for tasks assessed/performed       Past Medical History:  Diagnosis Date  . Abnormal ECG 07/18/2015  . Arthritis   . Asthma   . Bowel obstruction ([place])   . Depression with anxiety 02/09/2015   mild xanax prn  . Diabetes mellitus ([place]) 07/18/2015   type 2  . Diverticulitis   . Dysrhythmia    abnormal ekgs worked up  . Essential tremor 03/31/2016  . Fibromyalgia   . Gait difficulty 02/09/2015  . Gallstones   . GERD (gastroesophageal reflux disease)   . HLD (hyperlipidemia)   . Hypersomnia 11/09/2015  . Hypertension    no meds  . Hyperthyroidism   . Hypothyroidism   . IBS (irritable bowel syndrome)   . Insomnia 02/09/2015  . Migraines   . MS (multiple sclerosis) ([place])   . Multiple joint pain 03/31/2016  . Numbness in both hands 03/18/2016  . Other fatigue 02/09/2015  . Peptic ulcer   . Pneumonia   . PONV (postoperative nausea and vomiting)   . Urinary hesitancy 02/09/2015  . Vision abnormalities     Past Surgical History:  Procedure Laterality Date  . ABDOMINAL HYSTERECTOMY  1976   including oophorectomy, due to uterine cancer  . APPENDECTOMY  1974  . CARPAL TUNNEL RELEASE Left    carpal tunnel surgery  . CHOLECYSTECTOMY     2006  . COLON SURGERY     SBO x 2  . EUS N/A  04/09/2017   Procedure: UPPER ENDOSCOPIC ULTRASOUND (EUS) LINEAR;  Surgeon: Milus Banister, MD;  Location: [facility];  Service: Endoscopy;  Laterality: N/A;  . HERNIA REPAIR    . MENISCUS REPAIR Left   . SMALL INTESTINE SURGERY    . toenail removal Right 12/2017   great toenail     There were no vitals filed for this visit.   Subjective Assessment - 09/02/18 0902    Subjective  She rpeorts Lt shoulder was locked na dhas some RTC tears.   She is post closed manipulation 08/26/18    Pertinent History  10 year history chronic shoulder pain, MS, chronic pain in pain management, fibromyalgia, depression/anxiety, cervical radiculopathy, essential tremor, multiple joint pain, type II diabetes    Limitations  Lifting;House hold activities    Patient Stated Goals  HEP, To prevent shoulder to freeze up    Pain Score  5    5.5.   Pain Location  Shoulder    Pain Orientation  Left    Pain Descriptors / Indicators  Dull;Aching;Throbbing    Pain Type  Chronic pain    Pain Onset  More than a month ago    Pain Frequency  Constant    Aggravating Factors   using arm for ADL's    Pain Relieving Factors  meds         [facility] PT Assessment - 09/02/18 0001      Assessment   Medical Diagnosis  Left shoulder adhesive capsulitis    Referring Provider (PT)  Joni Fears, MD    Onset Date/Surgical Date  08/26/18    Hand Dominance  Right    Next MD Visit  4 weeks    Prior Therapy  last year      Precautions   Precautions  None      Restrictions   Weight Bearing Restrictions  No      Balance Screen   Has the patient fallen in the past 6 months  No      [place] residence    Living Arrangements  Spouse/significant other      Prior Function   Level of Independence  Needs assistance with homemaking;Needs assistance with ADLs      Cognition   Overall Cognitive Status  Within Functional Limits for tasks assessed      Observation/Other Assessments    Focus on Therapeutic Outcomes (FOTO)   54% limited      AROM   Right Shoulder Flexion  125 Degrees    Right Shoulder ABduction  115 Degrees    Right Shoulder Internal Rotation  50 Degrees    Right Shoulder External Rotation  90 Degrees    Left Shoulder Flexion  138 Degrees    Left Shoulder ABduction  118 Degrees    Left Shoulder Internal Rotation  35 Degrees    Left Shoulder External Rotation  70 Degrees      Strength   Left Shoulder Flexion  4/5    Left Shoulder Extension  4+/5    Left Shoulder ABduction  4-/5    Left Shoulder Internal Rotation  4/5    Left Shoulder External Rotation  4/5                Objective measurements completed on examination: See above findings.                PT Short Term Goals - 04/30/18 1217      PT SHORT TERM GOAL #1   Title  Independent with HEP    Baseline  no HEP    Time  3    Period  Weeks    Status  New      PT SHORT TERM GOAL #2   Title  Increase left shoulder flexion and abduction AROM at least 10 deg to improve ability to use left UE to wash hair    Baseline  90 deg flex, 70 deg abd    Time  3    Period  Weeks    Status  New        PT Long Term Goals - 09/02/18 0932      PT LONG TERM GOAL #2   Title  She will maintain ROM and improve as able to use arm for assisted home tasks as able    Time  4    Period  Weeks    Status  New      PT LONG TERM GOAL #3   Title  She will be indpendent with all hEP issued    Time  4    Period  Weeks    Status  New  PT LONG TERM GOAL #4   Title  Perform ADLs/IADLs incl. dressing, bathing with left shoulder pain 3/10 or less    Baseline  5/10 pain today    Time  4    Period  Weeks    Status  New      PT LONG TERM GOAL #5   Title  She will improve all Lt should strength to 4/5 to assist with use of arm for selfcare and home tasks    Time  4    Period  Weeks    Status  New             Plan - 09/02/18 0929    Clinical Impression Statement  Ms Balinda Quails  returns now post closed MUA with significant improement in ROm active and passive. She still has RTC tears so progress may be limited  so will see for a month  and progress HEP to maintain ROM and improve strength as able .    Stability/Clinical Decision Making  Stable/Uncomplicated    Clinical Decision Making  Low    Rehab Potential  Good    Clinical Impairments Affecting Rehab Potential  chronic pain history, limited benefit with PT last year, multiple medical comorbidities    PT Frequency  2x / week    PT Duration  4 weeks    PT Treatment/Interventions  ADLs/Self Care Home Management;Electrical Stimulation;Iontophoresis 4mg /ml Dexamethasone;Moist Heat;Ultrasound;Cryotherapy;Functional mobility training;Therapeutic activities;Therapeutic exercise;Neuromuscular re-education;Dry needling;Manual techniques;Passive range of motion;Patient/family education;Taping;Vasopneumatic Device    PT Next Visit Plan  Establish HEP for strength and maitain ROM    PT Home Exercise Plan  pendulums, table slides, wand ER, scapular retraction, wall slide , reaching behind back , scapula elevation /depression / retraction    Consulted and Agree with Plan of Care  Patient       Patient will benefit from skilled therapeutic intervention in order to improve the following deficits and impairments:  Pain, Decreased strength, Decreased range of motion, Decreased activity tolerance, Impaired UE functional use, Hypomobility  Visit Diagnosis: 1. Chronic left shoulder pain   2. Stiffness of left shoulder, not elsewhere classified   3. Muscle weakness (generalized)        Problem List Patient Active Problem List   Diagnosis Date Noted  . Cervicalgia 08/10/2018  . 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 Loss of weight   . RLQ abdominal pain 03/27/2017  . Bloating 03/27/2017  . Abnormal finding on imaging 03/27/2017  . Dilation of biliary tract 03/27/2017  . Nausea 03/27/2017  . Multiple joint pain  03/31/2016  . Essential tremor 03/31/2016  . Numbness in both hands 03/18/2016  . Hypersomnia 11/09/2015  . Diabetes mellitus ([place]) 07/18/2015  . Essential hypertension 07/18/2015  . Abnormal ECG 07/18/2015  . Gait difficulty 02/09/2015  . Chronic left shoulder pain 02/09/2015  . Depression with anxiety 02/09/2015  . Numbness 02/09/2015  . Insomnia 02/09/2015  . Urinary hesitancy 02/09/2015  . Other fatigue 02/09/2015    Darrel Hoover  PT 09/02/2018, 9:40 AM  [facility] [address] [place], [place], [address] Phone: [phone]   Fax:  [phone]  Name: Tawney Vanorman MRN: [id number] Date of Birth: 07-24-1955

## 2018-09-09 DIAGNOSIS — F419 Anxiety disorder, unspecified: Secondary | ICD-10-CM | POA: Insufficient documentation

## 2018-09-09 DIAGNOSIS — K219 Gastro-esophageal reflux disease without esophagitis: Secondary | ICD-10-CM | POA: Insufficient documentation

## 2018-09-10 ENCOUNTER — Encounter

## 2018-09-14 ENCOUNTER — Other Ambulatory Visit: Payer: Self-pay

## 2018-09-14 ENCOUNTER — Ambulatory Visit: Payer: Medicare Other

## 2018-09-14 DIAGNOSIS — M6281 Muscle weakness (generalized): Secondary | ICD-10-CM

## 2018-09-14 DIAGNOSIS — G8929 Other chronic pain: Secondary | ICD-10-CM

## 2018-09-14 DIAGNOSIS — M25612 Stiffness of left shoulder, not elsewhere classified: Secondary | ICD-10-CM

## 2018-09-14 DIAGNOSIS — M25512 Pain in left shoulder: Secondary | ICD-10-CM

## 2018-09-14 NOTE — Patient Instructions (Signed)
(  Clinic) Flexion: Horizontal Abduction (Multiple Resist) Resistance: Single Arm Punch   De espaldas al punto de agarre. Pulgar hacia adentro empuje el brazo derecho hacia adelante. Extienda completamente el codo y lleve el hombro hacia adelante tanto como le sea posible. Rote el tronco. Repita ___ veces. Repita con el otro brazo por rutina. Descanse ___ segundos despus de cada rutina. Realice ___ rutinas por sesin.  http://plyo.exer.us/224   Copyright  VHI. All rights reserved.  EXTENSION: Standing - Resistance Band: Stable (Active)   Stand, right arm at side. Against yellow resistance band, draw arm backward, as far as possible, keeping elbow straight. Complete ___ sets of ___ repetitions. Perform ___ sessions per day.  Copyright  VHI. All rights reserved.  EXTERNAL ROTATION: Sitting - Resistance Band (Active)   Sit, right arm elbow bent to 90, elbow against side, hand forward. Against yellow resistance band, rotate forearm outward, keeping elbow at side. Rotate forearm outward as far as possible. Complete ___ sets of ___ repetitions. Perform ___ sessions per day.  Copyright  VHI. All rights reserved.  Internal Rotation (Resistive Band)   Doble el codo en ngulo recto y mantngalo firmemente contra su cintura. Fije la banda al picaporte de la puerta y tire Medical sales representative. Mantenga ____ segundos. Repita ____ veces. Haga ____ sesiones por da.  Copyright  VHI. All rights reserved.  (Home) External Rotation: With Abduction - Sitting   Opposite side toward anchor, right arm supported, elbow out, bent to 90, forearm across body. Rotate shoulder by pulling hand away from body. Keep elbow bent to 90. Repeat ____ times per set. Do ____ sets per session. Do ____ sessions per week. Use ____ lb weights.  Copyright  VHI. All rights reserved.   DO ALL 10-20 REPS   1X/DAY  IF PULLING TO SIDE IS PAINFUL GO LIGHTER WITH PULLHorizontal Abduction (Resistive Band)    With arms at  below  shoulder level, keep elbows as straight as comfortble., pull  arms outward. Hold ____ seconds. Repeat 10-20____ times. Do __1__ sessions per day.  Copyright  VHI. All rights reserved.

## 2018-09-14 NOTE — Therapy (Signed)
[facility] [place], [place], [address] Phone: [phone]   Fax:  [phone]  Physical Therapy Treatment  Patient Details  Name: Courtney [name] MRN: [id number] Date of Birth: [birth year] Referring Provider (PT): Joni Fears, MD   Encounter Date: 09/14/2018  PT End of Session - 09/14/18 1122    Visit Number  2    Number of Visits  8    Date for PT Re-Evaluation  10/01/18    Authorization Type  Medicare, Tricare, progress note by 10th visit, KX at 4    PT Start Time  1120    PT Stop Time  1200    PT Time Calculation (min)  40 min    Activity Tolerance  Patient tolerated treatment well    Behavior During Therapy  [facility] for tasks assessed/performed       Past Medical History:  Diagnosis Date  . Abnormal ECG 07/18/2015  . Arthritis   . Asthma   . Bowel obstruction ([place])   . Depression with anxiety 02/09/2015   mild xanax prn  . Diabetes mellitus ([place]) 07/18/2015   type 2  . Diverticulitis   . Dysrhythmia    abnormal ekgs worked up  . Essential tremor 03/31/2016  . Fibromyalgia   . Gait difficulty 02/09/2015  . Gallstones   . GERD (gastroesophageal reflux disease)   . HLD (hyperlipidemia)   . Hypersomnia 11/09/2015  . Hypertension    no meds  . Hyperthyroidism   . Hypothyroidism   . IBS (irritable bowel syndrome)   . Insomnia 02/09/2015  . Migraines   . MS (multiple sclerosis) ([place])   . Multiple joint pain 03/31/2016  . Numbness in both hands 03/18/2016  . Other fatigue 02/09/2015  . Peptic ulcer   . Pneumonia   . PONV (postoperative nausea and vomiting)   . Urinary hesitancy 02/09/2015  . Vision abnormalities     Past Surgical History:  Procedure Laterality Date  . ABDOMINAL HYSTERECTOMY  1976   including oophorectomy, due to uterine cancer  . APPENDECTOMY  1974  . CARPAL TUNNEL RELEASE Left    carpal tunnel surgery  . CHOLECYSTECTOMY     2006  . COLON SURGERY     SBO x 2  . EUS N/A  04/09/2017   Procedure: UPPER ENDOSCOPIC ULTRASOUND (EUS) LINEAR;  Surgeon: Milus Banister, MD;  Location: [facility];  Service: Endoscopy;  Laterality: N/A;  . HERNIA REPAIR    . MENISCUS REPAIR Left   . SMALL INTESTINE SURGERY    . toenail removal Right 12/2017   great toenail     There were no vitals filed for this visit.  Subjective Assessment - 09/14/18 1126    Subjective  Doing very well.  I do my exercises many times per day.  Tender in trap/supraspiatus area. occasional shooting pain  to elbow                       [facility] Adult PT Treatment/Exercise - 09/14/18 0001      Exercises   Exercises  Shoulder      Shoulder Exercises: Standing   Other Standing Exercises  [place] and hor abduciton green band except ER with yellow band . Pain with ER so modified range and tension     Other Standing Exercises  reviewed HEP from eval and she was able to do correctly with excellant ROM      Shoulder Exercises: ROM/Strengthening   UBE (  Upper Arm Bike)  5 min L1       Issued yellow , red and green band      PT Education - 09/14/18 1207    Education Details  HEP [place]    Person(s) Educated  Patient    Methods  Explanation;Demonstration;Verbal cues;Handout    Comprehension  Returned demonstration;Verbalized understanding       PT Short Term Goals - 04/30/18 1217      PT SHORT TERM GOAL #1   Title  Independent with HEP    Baseline  no HEP    Time  3    Period  Weeks    Status  New      PT SHORT TERM GOAL #2   Title  Increase left shoulder flexion and abduction AROM at least 10 deg to improve ability to use left UE to wash hair    Baseline  90 deg flex, 70 deg abd    Time  3    Period  Weeks    Status  New        PT Long Term Goals - 09/02/18 0932      PT LONG TERM GOAL #2   Title  She will maintain ROM and improve as able to use arm for assisted home tasks as able    Time  4    Period  Weeks    Status  New      PT LONG TERM GOAL #3    Title  She will be indpendent with all hEP issued    Time  4    Period  Weeks    Status  New      PT LONG TERM GOAL #4   Title  Perform ADLs/IADLs incl. dressing, bathing with left shoulder pain 3/10 or less    Baseline  5/10 pain today    Time  4    Period  Weeks    Status  New      PT LONG TERM GOAL #5   Title  She will improve all Lt should strength to 4/5 to assist with use of arm for selfcare and home tasks    Time  4    Period  Weeks    Status  New            Plan - 09/14/18 1124    Clinical Impression Statement  ROM is excellant.  ER painful and needs to be modified to decr pain. She was able to do this.   Cautioned her to not have pain with band exercises  She reported she may only come in a few sessions  due to covid-19    Clinical Impairments Affecting Rehab Potential  chronic pain history, limited benefit with PT last year, multiple medical comorbidities    PT Treatment/Interventions  ADLs/Self Care Home Management;Electrical Stimulation;Iontophoresis 4mg /ml Dexamethasone;Moist Heat;Ultrasound;Cryotherapy;Functional mobility training;Therapeutic activities;Therapeutic exercise;Neuromuscular re-education;Dry needling;Manual techniques;Passive range of motion;Patient/family education;Taping;Vasopneumatic Device    PT Next Visit Plan  Review HEp . Possible STW modalities    PT Home Exercise Plan  pendulums, table slides, wand ER, scapular retraction, wall slide , reaching behind back , scapula elevation /depression / retraction.  [place] , hor abduction    Consulted and Agree with Plan of Care  Patient       Patient will benefit from skilled therapeutic intervention in order to improve the following deficits and impairments:  Pain, Decreased strength, Decreased range of motion, Decreased activity tolerance, Impaired UE functional use, Hypomobility  Visit  Diagnosis: 1. Chronic left shoulder pain   2. Stiffness of left shoulder, not elsewhere classified   3. Muscle  weakness (generalized)        Problem List Patient Active Problem List   Diagnosis Date Noted  . Cervicalgia 08/10/2018  . 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 Loss of weight   . RLQ abdominal pain 03/27/2017  . Bloating 03/27/2017  . Abnormal finding on imaging 03/27/2017  . Dilation of biliary tract 03/27/2017  . Nausea 03/27/2017  . Multiple joint pain 03/31/2016  . Essential tremor 03/31/2016  . Numbness in both hands 03/18/2016  . Hypersomnia 11/09/2015  . Diabetes mellitus ([place]) 07/18/2015  . Essential hypertension 07/18/2015  . Abnormal ECG 07/18/2015  . Gait difficulty 02/09/2015  . Chronic left shoulder pain 02/09/2015  . Depression with anxiety 02/09/2015  . Numbness 02/09/2015  . Insomnia 02/09/2015  . Urinary hesitancy 02/09/2015  . Other fatigue 02/09/2015    Darrel Hoover  PT 09/14/2018, 12:15 PM  [facility] [address] [place], [place], [address] Phone: [phone]   Fax:  [phone]  Name: Courtney [name] MRN: [id number] Date of Birth: Nov 15, 1955

## 2018-09-15 DIAGNOSIS — M545 Low back pain: Secondary | ICD-10-CM | POA: Diagnosis not present

## 2018-09-15 DIAGNOSIS — M25519 Pain in unspecified shoulder: Secondary | ICD-10-CM | POA: Diagnosis not present

## 2018-09-15 DIAGNOSIS — G894 Chronic pain syndrome: Secondary | ICD-10-CM | POA: Diagnosis not present

## 2018-09-15 DIAGNOSIS — E1140 Type 2 diabetes mellitus with diabetic neuropathy, unspecified: Secondary | ICD-10-CM | POA: Diagnosis not present

## 2018-09-15 DIAGNOSIS — M542 Cervicalgia: Secondary | ICD-10-CM | POA: Diagnosis not present

## 2018-09-15 DIAGNOSIS — M79672 Pain in left foot: Secondary | ICD-10-CM | POA: Diagnosis not present

## 2018-09-15 DIAGNOSIS — M79606 Pain in leg, unspecified: Secondary | ICD-10-CM | POA: Diagnosis not present

## 2018-09-15 DIAGNOSIS — Z79891 Long term (current) use of opiate analgesic: Secondary | ICD-10-CM | POA: Diagnosis not present

## 2018-09-15 DIAGNOSIS — M797 Fibromyalgia: Secondary | ICD-10-CM | POA: Diagnosis not present

## 2018-09-16 ENCOUNTER — Ambulatory Visit: Payer: Medicare Other

## 2018-09-21 ENCOUNTER — Ambulatory Visit: Payer: Medicare Other

## 2018-09-23 ENCOUNTER — Ambulatory Visit: Payer: Medicare Other

## 2018-09-23 ENCOUNTER — Other Ambulatory Visit: Payer: Self-pay

## 2018-09-23 DIAGNOSIS — M25512 Pain in left shoulder: Secondary | ICD-10-CM | POA: Diagnosis not present

## 2018-09-23 DIAGNOSIS — G8929 Other chronic pain: Secondary | ICD-10-CM | POA: Diagnosis not present

## 2018-09-23 DIAGNOSIS — M25612 Stiffness of left shoulder, not elsewhere classified: Secondary | ICD-10-CM

## 2018-09-23 DIAGNOSIS — M6281 Muscle weakness (generalized): Secondary | ICD-10-CM

## 2018-09-23 NOTE — Therapy (Addendum)
[place], [place], [address] Phone: [phone]   Fax:  [phone]  Physical Therapy Treatment  Patient Details  Name: Courtney [name] MRN: [id number] Date of Birth: 1955/11/08 Referring Provider (PT): Joni Fears, MD   Encounter Date: 09/23/2018  PT End of Session - 09/23/18 1237    Visit Number  3    Number of Visits  8    Date for PT Re-Evaluation  10/01/18    Authorization Type  Medicare, [employer], progress note by 10th visit, KX at 58    PT Start Time  1127    PT Stop Time  1210    PT Time Calculation (min)  43 min    Activity Tolerance  Patient tolerated treatment well    Behavior During Therapy  [facility] for tasks assessed/performed       Past Medical History:  Diagnosis Date  . Abnormal ECG 07/18/2015  . Arthritis   . Asthma   . Bowel obstruction ([place])   . Depression with anxiety 02/09/2015   mild xanax prn  . Diabetes mellitus ([place]) 07/18/2015   type 2  . Diverticulitis   . Dysrhythmia    abnormal ekgs worked up  . Essential tremor 03/31/2016  . Fibromyalgia   . Gait difficulty 02/09/2015  . Gallstones   . GERD (gastroesophageal reflux disease)   . HLD (hyperlipidemia)   . Hypersomnia 11/09/2015  . Hypertension    no meds  . Hyperthyroidism   . Hypothyroidism   . IBS (irritable bowel syndrome)   . Insomnia 02/09/2015  . Migraines   . MS (multiple sclerosis) ([place])   . Multiple joint pain 03/31/2016  . Numbness in both hands 03/18/2016  . Other fatigue 02/09/2015  . Peptic ulcer   . Pneumonia   . PONV (postoperative nausea and vomiting)   . Urinary hesitancy 02/09/2015  . Vision abnormalities     Past Surgical History:  Procedure Laterality Date  . ABDOMINAL HYSTERECTOMY  1976   including oophorectomy, due to uterine cancer  . APPENDECTOMY  1974  . CARPAL TUNNEL RELEASE Left    carpal tunnel surgery  . CHOLECYSTECTOMY     2006  . COLON SURGERY     SBO x 2  . EUS N/A  04/09/2017   Procedure: UPPER ENDOSCOPIC ULTRASOUND (EUS) LINEAR;  Surgeon: Milus Banister, MD;  Location: [facility] ENDOSCOPY;  Service: Endoscopy;  Laterality: N/A;  . HERNIA REPAIR    . MENISCUS REPAIR Left   . SMALL INTESTINE SURGERY    . toenail removal Right 12/2017   great toenail     There were no vitals filed for this visit.  Subjective Assessment - 09/23/18 1231    Subjective  LT shoulder has pain.   Worse since last in PT . Still doing HEP.    Pertinent History  10 year history chronic shoulder pain, MS, chronic pain in pain management, fibromyalgia, depression/anxiety, cervical radiculopathy, essential tremor, multiple joint pain, type II diabetes    Pain Score  6     Pain Location  Shoulder    Pain Orientation  Left    Pain Descriptors / Indicators  Aching;Throbbing    Pain Type  Chronic pain    Pain Onset  More than a month ago    Pain Frequency  Constant    Aggravating Factors   using LT arm    Pain Relieving Factors  meds , ice  [facility] PT Assessment - 09/23/18 0001      Assessment   Medical Diagnosis  Left shoulder adhesive capsulitis    Referring Provider (PT)  Joni Fears, MD      AROM   Left Shoulder Flexion  145 Degrees    Left Shoulder ABduction  140 Degrees    Left Shoulder Internal Rotation  45 Degrees    Left Shoulder External Rotation  85 Degrees                   [facility] Adult PT Treatment/Exercise - 09/23/18 0001      Self-Care   Self-Care  Other Self-Care Comments    Other Self-Care Comments   instructed in use of theracane and tennis ball for STW in shoulder and back.       Shoulder Exercises: Seated   Other Seated Exercises  Active ROm all planes x 5-10      Shoulder Exercises: Standing   Other Standing Exercises  rockwood and hor abduciton green band except ER with yellow band . Pain with ER so modified range and tension       Shoulder Exercises: ROM/Strengthening   UBE (Upper Arm Bike)  5 min L1                PT Short Term Goals - 09/23/18 1238      PT SHORT TERM GOAL #1   Title  Independent with HEP    Status  Achieved      PT SHORT TERM GOAL #2   Title  Increase left shoulder flexion and abduction AROM at least 10 deg to improve ability to use left UE to wash hair    Baseline  150 flexion,  155  abduction    Status  Achieved        PT Long Term Goals - 09/23/18 1244      PT LONG TERM GOAL #2   Title  She will maintain ROM and improve as able to use arm for assisted home tasks as able    Baseline  ROM continued improvedd    Status  Achieved      PT LONG TERM GOAL #3   Title  She will be indpendent with all hEP issued    Status  On-going      PT LONG TERM GOAL #4   Title  Perform ADLs/IADLs incl. dressing, bathing with left shoulder pain 3/10 or less    Baseline  She reports using as able limited by pain and doesn't feel as strong as Rt arm    Status  Partially Met      PT LONG TERM GOAL #5   Title  She will improve all Lt should strength to 4/5 to assist with use of arm for selfcare and home tasks    Baseline  Flexion 4-/5 all others 4/5 (abduciton) to  4+/5   some pain with flexion and abduction    Status  Partially Met            Plan - 09/23/18 1238    Clinical Impression Statement  ROM continues to be excellant but need encourage ment to max ROm with effort and /or asssit end range with RT arm. pain remains moderate at [facility] joint and spaspinatus and biceps .    PT Treatment/Interventions  ADLs/Self Care Home Management;Electrical Stimulation;Iontophoresis 49m/ml Dexamethasone;Moist Heat;Ultrasound;Cryotherapy;Functional mobility training;Therapeutic activities;Therapeutic exercise;Neuromuscular re-education;Dry needling;Manual techniques;Passive range of motion;Patient/family education;Taping;Vasopneumatic Device    PT Next Visit Plan  Progress over  shoulder height strength for  HEp . Possible STW modalities    PT Home Exercise Plan  pendulums, table  slides, wand ER, scapular retraction, wall slide , reaching behind back , scapula elevation /depression / retraction.  Rockwood , hor abduction    Consulted and Agree with Plan of Care  Patient       Patient will benefit from skilled therapeutic intervention in order to improve the following deficits and impairments:  Pain, Decreased strength, Decreased range of motion, Decreased activity tolerance, Impaired UE functional use, Hypomobility  Visit Diagnosis: 1. Stiffness of left shoulder, not elsewhere classified   2. Chronic left shoulder pain   3. Muscle weakness (generalized)        Problem List Patient Active Problem List   Diagnosis Date Noted  . Cervicalgia 08/10/2018  . 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 Loss of weight   . RLQ abdominal pain 03/27/2017  . Bloating 03/27/2017  . Abnormal finding on imaging 03/27/2017  . Dilation of biliary tract 03/27/2017  . Nausea 03/27/2017  . Multiple joint pain 03/31/2016  . Essential tremor 03/31/2016  . Numbness in both hands 03/18/2016  . Hypersomnia 11/09/2015  . Diabetes mellitus ([place]) 07/18/2015  . Essential hypertension 07/18/2015  . Abnormal ECG 07/18/2015  . Gait difficulty 02/09/2015  . Chronic left shoulder pain 02/09/2015  . Depression with anxiety 02/09/2015  . Numbness 02/09/2015  . Insomnia 02/09/2015  . Urinary hesitancy 02/09/2015  . Other fatigue 02/09/2015    Darrel Hoover  PT 09/23/2018, 1:19 PM  [facility] [address] [place], [place], [address] Phone: [phone]   Fax:  [phone]  Name: Courtney [name] MRN: [id number] Date of Birth: 06-22-1955  PHYSICAL THERAPY DISCHARGE SUMMARY  Visits from Start of Care: 3  Current functional level related to goals / functional outcomes: See above. She canceled appointment after this one not wanting to come out for Pt at this time   Remaining deficits: Unknown  Education /  Equipment: HEP  Plan: Patient agrees to discharge.  Patient goals were not met. Patient is being discharged due to the patient's request.  ?????    Pearson Forster PT 11/15/18

## 2018-09-28 ENCOUNTER — Ambulatory Visit: Payer: Medicare Other

## 2018-09-28 ENCOUNTER — Other Ambulatory Visit: Payer: Self-pay

## 2018-09-28 ENCOUNTER — Encounter: Payer: Self-pay | Admitting: Orthopaedic Surgery

## 2018-09-28 ENCOUNTER — Ambulatory Visit (INDEPENDENT_AMBULATORY_CARE_PROVIDER_SITE_OTHER): Payer: Medicare Other | Admitting: Orthopaedic Surgery

## 2018-09-28 VITALS — BP 128/88 | HR 74 | Ht 63.0 in | Wt 154.0 lb

## 2018-09-28 DIAGNOSIS — M7502 Adhesive capsulitis of left shoulder: Secondary | ICD-10-CM

## 2018-09-28 NOTE — Progress Notes (Signed)
Office Visit Note   Patient: Courtney [name]           Date of Birth: 11-23-1955           MRN: [id number] Visit Date: 09/28/2018              Requested by: Lucianne Lei, MD [address] [place],  [place] [address] PCP: Lucianne Lei, MD   Assessment & Plan: Visit Diagnoses:  1. Adhesive capsulitis of left shoulder     Plan:  #1:  Continue exercises #2:  F/U prn    Follow-Up Instructions: Return if symptoms worsen or fail to improve.   Orders:  No orders of the defined types were placed in this encounter.  No orders of the defined types were placed in this encounter.     Procedures: No procedures performed   Clinical Data: No additional findings.   Subjective: Chief Complaint  Patient presents with  . Left Shoulder - Routine Post Op    Left shoulder manipulation 08/26/2018  Patient presents today for follow up on her left shoulder. She had left shoulder manipulation on 08/26/2018. She is now 5 weeks out from surgery. Patient states that she is doing well, but has noticed that something is "knocking".   HPI  Review of Systems   Objective: Vital Signs: BP 128/88   Pulse 74   Ht 5\' 3"  (1.6 m)   Wt 154 lb (69.9 kg)   BMI 27.28 kg/m   Physical Exam  Ortho Exam  Exam today revels 90 degrees abduction.  170 degrees forward flexion.  With arm at 90 degrees of abduction allows for 90 degrees ER and 70 degrees IR.  Specialty Comments:  No specialty comments available.  Imaging: No results found.   PMFS History: Patient Active Problem List   Diagnosis Date Noted  . Cervicalgia 08/10/2018  . 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 Loss of weight   . RLQ abdominal pain 03/27/2017  . Bloating 03/27/2017  . Abnormal finding on imaging 03/27/2017  . Dilation of biliary tract 03/27/2017  . Nausea 03/27/2017  . Multiple joint pain 03/31/2016  . Essential tremor 03/31/2016  . Numbness in both hands 03/18/2016  . Hypersomnia  11/09/2015  . Diabetes mellitus ([place]) 07/18/2015  . Essential hypertension 07/18/2015  . Abnormal ECG 07/18/2015  . Gait difficulty 02/09/2015  . Chronic left shoulder pain 02/09/2015  . Depression with anxiety 02/09/2015  . Numbness 02/09/2015  . Insomnia 02/09/2015  . Urinary hesitancy 02/09/2015  . Other fatigue 02/09/2015   Past Medical History:  Diagnosis Date  . Abnormal ECG 07/18/2015  . Arthritis   . Asthma   . Bowel obstruction ([place])   . Depression with anxiety 02/09/2015   mild xanax prn  . Diabetes mellitus ([place]) 07/18/2015   type 2  . Diverticulitis   . Dysrhythmia    abnormal ekgs worked up  . Essential tremor 03/31/2016  . Fibromyalgia   . Gait difficulty 02/09/2015  . Gallstones   . GERD (gastroesophageal reflux disease)   . HLD (hyperlipidemia)   . Hypersomnia 11/09/2015  . Hypertension    no meds  . Hyperthyroidism   . Hypothyroidism   . IBS (irritable bowel syndrome)   . Insomnia 02/09/2015  . Migraines   . MS (multiple sclerosis) ([place])   . Multiple joint pain 03/31/2016  . Numbness in both hands 03/18/2016  . Other fatigue 02/09/2015  . Peptic ulcer   . Pneumonia   .  PONV (postoperative nausea and vomiting)   . Urinary hesitancy 02/09/2015  . Vision abnormalities     Family History  Problem Relation Age of Onset  . Heart disease Mother   . Kidney disease Mother   . Diabetes Mother   . Other Father 51       murdered  . Multiple sclerosis Daughter   . Multiple sclerosis Other   . Diabetes Sister   . Alcohol abuse Brother        ETOH and marijuana  . Diabetes Brother   . Diabetes Sister   . Diabetes Brother   . ALS Brother     Past Surgical History:  Procedure Laterality Date  . ABDOMINAL HYSTERECTOMY  1976   including oophorectomy, due to uterine cancer  . APPENDECTOMY  1974  . CARPAL TUNNEL RELEASE Left    carpal tunnel surgery  . CHOLECYSTECTOMY     2006  . COLON SURGERY     SBO x 2  . EUS N/A 04/09/2017   Procedure: UPPER ENDOSCOPIC  ULTRASOUND (EUS) LINEAR;  Surgeon: Milus Banister, MD;  Location: [facility];  Service: Endoscopy;  Laterality: N/A;  . HERNIA REPAIR    . MENISCUS REPAIR Left   . SMALL INTESTINE SURGERY    . toenail removal Right 12/2017   great toenail    Social History   Occupational History  . Occupation: disabled  Tobacco Use  . Smoking status: Former Smoker    Packs/day: 0.25    Years: 5.00    Pack years: 1.25    Types: Cigarettes    Quit date: 1990    Years since quitting: 30.5  . Smokeless tobacco: Never Used  Substance and Sexual Activity  . Alcohol use: No    Frequency: Never  . Drug use: No  . Sexual activity: Yes

## 2018-09-30 ENCOUNTER — Ambulatory Visit: Payer: Medicare Other

## 2018-10-13 DIAGNOSIS — G894 Chronic pain syndrome: Secondary | ICD-10-CM | POA: Diagnosis not present

## 2018-10-13 DIAGNOSIS — M25519 Pain in unspecified shoulder: Secondary | ICD-10-CM | POA: Diagnosis not present

## 2018-10-13 DIAGNOSIS — E1140 Type 2 diabetes mellitus with diabetic neuropathy, unspecified: Secondary | ICD-10-CM | POA: Diagnosis not present

## 2018-10-13 DIAGNOSIS — Z79891 Long term (current) use of opiate analgesic: Secondary | ICD-10-CM | POA: Diagnosis not present

## 2018-10-13 DIAGNOSIS — M545 Low back pain: Secondary | ICD-10-CM | POA: Diagnosis not present

## 2018-10-13 DIAGNOSIS — M79606 Pain in leg, unspecified: Secondary | ICD-10-CM | POA: Diagnosis not present

## 2018-10-13 DIAGNOSIS — M797 Fibromyalgia: Secondary | ICD-10-CM | POA: Diagnosis not present

## 2018-10-13 DIAGNOSIS — M79672 Pain in left foot: Secondary | ICD-10-CM | POA: Diagnosis not present

## 2018-10-13 DIAGNOSIS — M542 Cervicalgia: Secondary | ICD-10-CM | POA: Diagnosis not present

## 2018-10-25 ENCOUNTER — Encounter: Payer: Self-pay | Admitting: Orthopaedic Surgery

## 2018-10-26 ENCOUNTER — Telehealth: Payer: Self-pay | Admitting: Orthopaedic Surgery

## 2018-10-26 ENCOUNTER — Ambulatory Visit: Payer: Medicare Other | Admitting: Sports Medicine

## 2018-10-26 NOTE — Telephone Encounter (Signed)
Needs records sent to the neurologist at [facility] in [place]-pt has info. Having chronic neck, low back pain and UE numbness with hx MS

## 2018-10-26 NOTE — Telephone Encounter (Signed)
Please call pt and ask why she needs neurology referral-cannot find a reference in my notes

## 2018-10-26 NOTE — Telephone Encounter (Signed)
Patient called stating she was returning your call regarding her neurology referral.  Patient states she had left shoulder manipulation and at her follow-up appointment was told she was being referred to a neurologist for nerve pain.  Patient is requesting a return call.

## 2018-10-26 NOTE — Telephone Encounter (Signed)
See below

## 2018-10-26 NOTE — Telephone Encounter (Signed)
Faxed over MRI report and last office notes to [facility]. Fax [phone]

## 2018-10-27 DIAGNOSIS — G35 Multiple sclerosis: Secondary | ICD-10-CM | POA: Diagnosis not present

## 2018-10-27 DIAGNOSIS — Z794 Long term (current) use of insulin: Secondary | ICD-10-CM | POA: Diagnosis not present

## 2018-10-27 DIAGNOSIS — M797 Fibromyalgia: Secondary | ICD-10-CM | POA: Diagnosis not present

## 2018-10-27 DIAGNOSIS — I10 Essential (primary) hypertension: Secondary | ICD-10-CM | POA: Diagnosis not present

## 2018-10-27 DIAGNOSIS — Z23 Encounter for immunization: Secondary | ICD-10-CM | POA: Diagnosis not present

## 2018-10-27 DIAGNOSIS — I2510 Atherosclerotic heart disease of native coronary artery without angina pectoris: Secondary | ICD-10-CM | POA: Diagnosis not present

## 2018-10-27 DIAGNOSIS — E119 Type 2 diabetes mellitus without complications: Secondary | ICD-10-CM | POA: Diagnosis not present

## 2018-10-27 DIAGNOSIS — Z87891 Personal history of nicotine dependence: Secondary | ICD-10-CM | POA: Diagnosis not present

## 2018-10-27 DIAGNOSIS — F419 Anxiety disorder, unspecified: Secondary | ICD-10-CM | POA: Diagnosis not present

## 2018-10-27 DIAGNOSIS — G894 Chronic pain syndrome: Secondary | ICD-10-CM | POA: Diagnosis not present

## 2018-10-27 DIAGNOSIS — K219 Gastro-esophageal reflux disease without esophagitis: Secondary | ICD-10-CM | POA: Diagnosis not present

## 2018-10-27 DIAGNOSIS — M5412 Radiculopathy, cervical region: Secondary | ICD-10-CM | POA: Diagnosis not present

## 2018-11-04 DIAGNOSIS — L700 Acne vulgaris: Secondary | ICD-10-CM | POA: Diagnosis not present

## 2018-11-04 DIAGNOSIS — L821 Other seborrheic keratosis: Secondary | ICD-10-CM | POA: Diagnosis not present

## 2018-11-04 DIAGNOSIS — D239 Other benign neoplasm of skin, unspecified: Secondary | ICD-10-CM | POA: Diagnosis not present

## 2018-11-10 DIAGNOSIS — M25519 Pain in unspecified shoulder: Secondary | ICD-10-CM | POA: Diagnosis not present

## 2018-11-10 DIAGNOSIS — M797 Fibromyalgia: Secondary | ICD-10-CM | POA: Diagnosis not present

## 2018-11-10 DIAGNOSIS — M545 Low back pain: Secondary | ICD-10-CM | POA: Diagnosis not present

## 2018-11-10 DIAGNOSIS — G894 Chronic pain syndrome: Secondary | ICD-10-CM | POA: Diagnosis not present

## 2018-11-10 DIAGNOSIS — Z79891 Long term (current) use of opiate analgesic: Secondary | ICD-10-CM | POA: Diagnosis not present

## 2018-11-10 DIAGNOSIS — M79606 Pain in leg, unspecified: Secondary | ICD-10-CM | POA: Diagnosis not present

## 2018-11-10 DIAGNOSIS — R490 Dysphonia: Secondary | ICD-10-CM | POA: Diagnosis not present

## 2018-11-10 DIAGNOSIS — J324 Chronic pansinusitis: Secondary | ICD-10-CM | POA: Diagnosis not present

## 2018-11-10 DIAGNOSIS — Z87891 Personal history of nicotine dependence: Secondary | ICD-10-CM | POA: Diagnosis not present

## 2018-11-10 DIAGNOSIS — M79672 Pain in left foot: Secondary | ICD-10-CM | POA: Diagnosis not present

## 2018-11-10 DIAGNOSIS — M542 Cervicalgia: Secondary | ICD-10-CM | POA: Diagnosis not present

## 2018-11-10 DIAGNOSIS — E1140 Type 2 diabetes mellitus with diabetic neuropathy, unspecified: Secondary | ICD-10-CM | POA: Diagnosis not present

## 2018-11-16 DIAGNOSIS — E119 Type 2 diabetes mellitus without complications: Secondary | ICD-10-CM | POA: Diagnosis not present

## 2018-11-16 DIAGNOSIS — H2513 Age-related nuclear cataract, bilateral: Secondary | ICD-10-CM | POA: Diagnosis not present

## 2018-11-16 DIAGNOSIS — G35 Multiple sclerosis: Secondary | ICD-10-CM | POA: Diagnosis not present

## 2018-11-16 DIAGNOSIS — H5010 Unspecified exotropia: Secondary | ICD-10-CM | POA: Diagnosis not present

## 2018-11-22 DIAGNOSIS — R5383 Other fatigue: Secondary | ICD-10-CM | POA: Diagnosis not present

## 2018-11-22 DIAGNOSIS — F411 Generalized anxiety disorder: Secondary | ICD-10-CM | POA: Diagnosis not present

## 2018-11-22 DIAGNOSIS — G35 Multiple sclerosis: Secondary | ICD-10-CM | POA: Diagnosis not present

## 2018-11-23 ENCOUNTER — Encounter: Payer: Medicare Other | Admitting: Psychology

## 2018-11-30 ENCOUNTER — Ambulatory Visit (INDEPENDENT_AMBULATORY_CARE_PROVIDER_SITE_OTHER): Payer: Medicare Other | Admitting: Sports Medicine

## 2018-11-30 ENCOUNTER — Other Ambulatory Visit: Payer: Self-pay

## 2018-11-30 DIAGNOSIS — M79674 Pain in right toe(s): Secondary | ICD-10-CM

## 2018-11-30 DIAGNOSIS — M79675 Pain in left toe(s): Secondary | ICD-10-CM | POA: Diagnosis not present

## 2018-11-30 DIAGNOSIS — B351 Tinea unguium: Secondary | ICD-10-CM | POA: Diagnosis not present

## 2018-11-30 DIAGNOSIS — E0842 Diabetes mellitus due to underlying condition with diabetic polyneuropathy: Secondary | ICD-10-CM

## 2018-12-01 ENCOUNTER — Encounter: Payer: Self-pay | Admitting: Sports Medicine

## 2018-12-01 NOTE — Progress Notes (Signed)
Subjective: Courtney [name] is a 63 y.o. female patient with history of diabetes who presents to office today complaining of long,mildly painful nails  while ambulating in shoes; unable to trim. Patient states that the glucose reading this morning was 140 mg/dl. Patient denies any new changes in medication or new problems. Patient denies any new cramping, numbness, burning or tingling in the legs.  No other issues noted.  Last A1c 7.2.  Patient Active Problem List   Diagnosis Date Noted  . Cervicalgia 08/10/2018  . 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 Loss of weight   . RLQ abdominal pain 03/27/2017  . Bloating 03/27/2017  . Abnormal finding on imaging 03/27/2017  . Dilation of biliary tract 03/27/2017  . Nausea 03/27/2017  . Multiple joint pain 03/31/2016  . Essential tremor 03/31/2016  . Numbness in both hands 03/18/2016  . Hypersomnia 11/09/2015  . Diabetes mellitus ([place]) 07/18/2015  . Essential hypertension 07/18/2015  . Abnormal ECG 07/18/2015  . Gait difficulty 02/09/2015  . Chronic left shoulder pain 02/09/2015  . Depression with anxiety 02/09/2015  . Numbness 02/09/2015  . Insomnia 02/09/2015  . Urinary hesitancy 02/09/2015  . Other fatigue 02/09/2015   Current Outpatient Medications on File Prior to Visit  Medication Sig Dispense Refill  . albuterol (PROVENTIL HFA;VENTOLIN HFA) 108 (90 Base) MCG/ACT inhaler Inhale 2 puffs into the lungs every 6 (six) hours as needed for wheezing or shortness of breath. 3 Inhaler 3  . alprazolam (XANAX) 2 MG tablet Take 2 mg by mouth 3 (three) times daily as needed for anxiety.     Marland Kitchen amLODipine (NORVASC) 5 MG tablet TK 1 T PO  HS    . butalbital-acetaminophen-caffeine (FIORICET, ESGIC) 50-325-40 MG tablet Take 1 tablet by mouth 2 (two) times daily as needed for headache.     . butalbital-aspirin-caffeine (FIORINAL) 50-325-40 MG tablet 1-2 TABS EVERY DAY PRN    . ciprofloxacin (CIPRO) 500 MG tablet Take 1 tablet (500 mg  total) by mouth 2 (two) times daily. Take for 10 days. 20 tablet 0  . clindamycin (CLEOCIN T) 1 % external solution 1 APPLICATION TOPICALLY ONCE DAILY    . empagliflozin (JARDIANCE) 10 MG TABS tablet Take 10 mg by mouth daily.    . insulin aspart protamine- aspart (NOVOLOG MIX 70/30) (70-30) 100 UNIT/ML injection Inject 18-20 Units into the skin 2 (two) times daily. Inject into skin 18 unit with breakfast, and 20 units with supper daily.    . Interferon Beta-1a (AVONEX PEN) 30 MCG/0.5ML AJKT Inject into the muscle.    . lidocaine (XYLOCAINE) 5 % ointment Apply 1 application topically daily as needed for mild pain.    Marland Kitchen lubiprostone (AMITIZA) 24 MCG capsule Take 1 capsule (24 mcg total) by mouth 2 (two) times daily with a meal. 60 capsule 6  . Methylnaltrexone Bromide (RELISTOR) 150 MG TABS Take by mouth.    . metroNIDAZOLE (FLAGYL) 500 MG tablet Take 1 tablet (500 mg total) by mouth 2 (two) times daily. Take for 10 days 20 tablet 0  . modafinil (PROVIGIL) 100 MG tablet Take 1/2 tab daily    . Multiple Vitamin (MULTIVITAMIN) tablet Take 1 tablet by mouth daily.    . naloxone (NARCAN) nasal spray 4 mg/0.1 mL Place 1 spray into the nose daily as needed (overdose).    [name] AD 4-10-325 MG TABS Take 1 tablet by mouth 4 (four) times daily.    . ondansetron (ZOFRAN ODT) 4 MG disintegrating tablet Take 1  tablet (4 mg total) by mouth every 6 (six) hours as needed for nausea or vomiting. 30 tablet 1  . oxyCODONE (OXY IR/ROXICODONE) 5 MG immediate release tablet Take 20 mg by mouth every 4 (four) hours as needed for severe pain.    Marland Kitchen oxyCODONE (OXYCONTIN) 10 mg 12 hr tablet Take 10 mg by mouth every 12 (twelve) hours.   0  . Oxycodone HCl 20 MG TABS TK 1 T PO  Q 4 H PRN    . pantoprazole (PROTONIX) 40 MG tablet Take 40 mg by mouth daily as needed (acid reflux).     . pregabalin (LYRICA) 25 MG capsule Take by mouth.    . triamcinolone (NASACORT) 55 MCG/ACT AERO nasal inhaler Place into the nose.    .  vortioxetine HBr (TRINTELLIX) 5 MG TABS tablet Take by mouth.     No current facility-administered medications on file prior to visit.    Allergies  Allergen Reactions  . Ibuprofen Other (See Comments)    Developed a ulcer  Other reaction(s): Other (See Comments) Developed a ulcer   . Prednisone Anaphylaxis, Shortness Of Breath and Palpitations  . Solu-Medrol [Methylprednisolone] Anaphylaxis, Shortness Of Breath and Palpitations  . Bactrim [Sulfamethoxazole-Trimethoprim] Other (See Comments)    "blotchiness" redness, face swelling  . Biaxin [Clarithromycin] Swelling  . Naproxen Other (See Comments)    Developed a ulcer   . Sulfa Antibiotics Other (See Comments)    Other reaction(s): Angioedema Other reaction(s): Angioedema    No results found for this or any previous visit (from the past 2160 hour(s)).  Objective: General: Patient is awake, alert, and oriented x 3 and in no acute distress.  Integument: Skin is warm, dry and supple bilateral. Nails are tender, long, thickened and  dystrophic with subungual debris, consistent with onychomycosis, 1-5 bilateral. No signs of infection.  Old surgical scar right foot.  No open lesions or preulcerative lesions present bilateral. Remaining integument unremarkable.  Vasculature:  Dorsalis Pedis pulse 1/4 bilateral. Posterior Tibial pulse  1/4 bilateral.  Capillary fill time <3 sec 1-5 bilateral. Positive hair growth to the level of the digits. Temperature gradient within normal limits. No varicosities present bilateral. No edema present bilateral.   Neurology: The patient has intact sensation measured with a 5.07/10g Semmes Weinstein Monofilament at all pedal sites bilateral . Vibratory sensation diminished bilateral with tuning fork. No Babinski sign present bilateral.   Musculoskeletal: No symptomatic pedal deformities noted bilateral. Muscular strength 5/5 in all lower extremity muscular groups bilateral without pain on range of motion .  No tenderness with calf compression bilateral.  Assessment and Plan: Problem List Items Addressed This Visit    None    Visit Diagnoses    Pain due to onychomycosis of toenails of both feet    -  Primary   Diabetic polyneuropathy associated with diabetes mellitus due to underlying condition (HCC)       Relevant Medications   Interferon Beta-1a (AVONEX PEN) 30 MCG/0.5ML AJKT   pregabalin (LYRICA) 25 MG capsule   modafinil (PROVIGIL) 100 MG tablet   vortioxetine HBr (TRINTELLIX) 5 MG TABS tablet      -Examined patient. -Discussed and educated patient on diabetic foot care, especially with  regards to the vascular, neurological and musculoskeletal systems.  -Stressed the importance of good glycemic control and the detriment of not  controlling glucose levels in relation to the foot. -Mechanically debrided all nails 1-5 bilateral using sterile nail nipper and filed with dremel without incident  -Answered all  patient questions -Patient to return  in 3 months for at risk foot care -Patient advised to call the office if any problems or questions arise in the meantime.  Landis Martins, DPM

## 2018-12-07 ENCOUNTER — Encounter: Payer: Self-pay | Admitting: Sports Medicine

## 2018-12-07 MED ORDER — MUPIROCIN 2 % EX OINT: TOPICAL_OINTMENT | CUTANEOUS | 5 refills | Status: DC

## 2018-12-09 DIAGNOSIS — Z79891 Long term (current) use of opiate analgesic: Secondary | ICD-10-CM | POA: Diagnosis not present

## 2018-12-09 DIAGNOSIS — M79672 Pain in left foot: Secondary | ICD-10-CM | POA: Diagnosis not present

## 2018-12-09 DIAGNOSIS — G894 Chronic pain syndrome: Secondary | ICD-10-CM | POA: Diagnosis not present

## 2018-12-09 DIAGNOSIS — E1140 Type 2 diabetes mellitus with diabetic neuropathy, unspecified: Secondary | ICD-10-CM | POA: Diagnosis not present

## 2018-12-09 DIAGNOSIS — M542 Cervicalgia: Secondary | ICD-10-CM | POA: Diagnosis not present

## 2018-12-09 DIAGNOSIS — M25519 Pain in unspecified shoulder: Secondary | ICD-10-CM | POA: Diagnosis not present

## 2018-12-09 DIAGNOSIS — M545 Low back pain: Secondary | ICD-10-CM | POA: Diagnosis not present

## 2018-12-09 DIAGNOSIS — M797 Fibromyalgia: Secondary | ICD-10-CM | POA: Diagnosis not present

## 2018-12-09 DIAGNOSIS — M79606 Pain in leg, unspecified: Secondary | ICD-10-CM | POA: Diagnosis not present

## 2018-12-20 DIAGNOSIS — G35 Multiple sclerosis: Secondary | ICD-10-CM | POA: Diagnosis not present

## 2018-12-20 DIAGNOSIS — R5383 Other fatigue: Secondary | ICD-10-CM | POA: Diagnosis not present

## 2018-12-20 DIAGNOSIS — F411 Generalized anxiety disorder: Secondary | ICD-10-CM | POA: Diagnosis not present

## 2018-12-20 NOTE — Progress Notes (Signed)
Cardiology Office Note   Date:  12/21/2018   ID:  Courtney [name], DOB October 12, 1955, MRN [id number]  PCP:  Courtney Lei, MD    No chief complaint on file.  Chest pain/RF for CAD  Wt Readings from Last 3 Encounters:  12/21/18 163 lb 6.4 oz (74.1 kg)  09/28/18 154 lb (69.9 kg)  08/31/18 154 lb (69.9 kg)       History of Present Illness: Courtney [name] is a 63 y.o. female  who has had a h/o recurrent chest pain.  SHe has been evaluated with cardiac cath in 2001 and 2006.  SHe has had multiple stress tests most recently in 2016 which was normal.    She moved to [place] in 10/16 to find warmer weather.  She has been diagnosed with MS and wants to be in a warmer climate.  She had some DOE a few weeks ago but this has improved.  She feels a heaviness in her legs when she walks.  No CP with walking.    She will occasionally have a skipped heart beat.  Since the last visit, she has continued to have pain.  She has had a chronically sore throat and was seen by ENT.    She was started on BP meds but BP got quite low.  BP has been fine since she stopped them.  She has not been exercising.    Denies : Chest pain. Dizziness. Leg edema. Nitroglycerin use. Orthopnea. Palpitations. Paroxysmal nocturnal dyspnea. Shortness of breath. Syncope.   Walking limited by balance issues.  She has fallen.  She has sx from multiple sclerosis.   She is using the mask and distancing with discipline.    Past Medical History:  Diagnosis Date  . Abnormal ECG 07/18/2015  . Arthritis   . Asthma   . Bowel obstruction ([place])   . Depression with anxiety 02/09/2015   mild xanax prn  . Diabetes mellitus ([place]) 07/18/2015   type 2  . Diverticulitis   . Dysrhythmia    abnormal ekgs worked up  . Essential tremor 03/31/2016  . Fibromyalgia   . Gait difficulty 02/09/2015  . Gallstones   . GERD (gastroesophageal reflux disease)   . HLD (hyperlipidemia)   . Hypersomnia 11/09/2015  . Hypertension    no  meds  . Hyperthyroidism   . Hypothyroidism   . IBS (irritable bowel syndrome)   . Insomnia 02/09/2015  . Migraines   . MS (multiple sclerosis) ([place])   . Multiple joint pain 03/31/2016  . Numbness in both hands 03/18/2016  . Other fatigue 02/09/2015  . Peptic ulcer   . Pneumonia   . PONV (postoperative nausea and vomiting)   . Urinary hesitancy 02/09/2015  . Vision abnormalities     Past Surgical History:  Procedure Laterality Date  . ABDOMINAL HYSTERECTOMY  1976   including oophorectomy, due to uterine cancer  . APPENDECTOMY  1974  . CARPAL TUNNEL RELEASE Left    carpal tunnel surgery  . CHOLECYSTECTOMY     2006  . COLON SURGERY     SBO x 2  . EUS N/A 04/09/2017   Procedure: UPPER ENDOSCOPIC ULTRASOUND (EUS) LINEAR;  Surgeon: Courtney Banister, MD;  Location: [facility];  Service: Endoscopy;  Laterality: N/A;  . HERNIA REPAIR    . MENISCUS REPAIR Left   . SMALL INTESTINE SURGERY    . toenail removal Right 12/2017   great toenail      Current Outpatient Medications  Medication Sig Dispense Refill  . albuterol (PROVENTIL HFA;VENTOLIN HFA) 108 (90 Base) MCG/ACT inhaler Inhale 2 puffs into the lungs every 6 (six) hours as needed for wheezing or shortness of breath. 3 Inhaler 3  . alprazolam (XANAX) 2 MG tablet Take 2 mg by mouth 3 (three) times daily as needed for anxiety.     . butalbital-acetaminophen-caffeine (FIORICET, ESGIC) 50-325-40 MG tablet Take 1 tablet by mouth 2 (two) times daily as needed for headache.     . butalbital-aspirin-caffeine (FIORINAL) 50-325-40 MG tablet 1-2 TABS EVERY DAY PRN    . clindamycin (CLEOCIN T) 1 % external solution 1 APPLICATION TOPICALLY ONCE DAILY    . empagliflozin (JARDIANCE) 10 MG TABS tablet Take 10 mg by mouth daily.    . insulin aspart protamine- aspart (NOVOLOG MIX 70/30) (70-30) 100 UNIT/ML injection Inject 18-20 Units into the skin 2 (two) times daily. Inject into skin 18 unit with breakfast, and 20 units with supper daily.    .  Interferon Beta-1a (AVONEX PEN) 30 MCG/0.5ML AJKT Inject into the muscle.    . lidocaine (XYLOCAINE) 5 % ointment Apply 1 application topically daily as needed for mild pain.    . modafinil (PROVIGIL) 100 MG tablet Take 1/2 tab daily    . Multiple Vitamin (MULTIVITAMIN) tablet Take 1 tablet by mouth daily.    . mupirocin ointment (BACTROBAN) 2 % Apply to affected areas as needed. 22 g 5  . naloxone (NARCAN) nasal spray 4 mg/0.1 mL Place 1 spray into the nose daily as needed (overdose).    Courtney [name] AD 4-10-325 MG TABS Take 1 tablet by mouth 4 (four) times daily.    . ondansetron (ZOFRAN ODT) 4 MG disintegrating tablet Take 1 tablet (4 mg total) by mouth every 6 (six) hours as needed for nausea or vomiting. 30 tablet 1  . oxyCODONE (OXY IR/ROXICODONE) 5 MG immediate release tablet Take 20 mg by mouth every 4 (four) hours as needed for severe pain.    Marland Kitchen oxyCODONE (OXYCONTIN) 10 mg 12 hr tablet Take 10 mg by mouth every 12 (twelve) hours.   0  . Oxycodone HCl 20 MG TABS TK 1 T PO  Q 4 H PRN    . pantoprazole (PROTONIX) 40 MG tablet Take 40 mg by mouth daily as needed (acid reflux).     . pregabalin (LYRICA) 25 MG capsule Take by mouth.    . triamcinolone (NASACORT) 55 MCG/ACT AERO nasal inhaler Place into the nose.     No current facility-administered medications for this visit.     Allergies:   Ibuprofen, Prednisone, Solu-medrol [methylprednisolone], Bactrim [sulfamethoxazole-trimethoprim], Biaxin [clarithromycin], Naproxen, and Sulfa antibiotics    Social History:  The patient  reports that she quit smoking about 30 years ago. Her smoking use included cigarettes. She has a 1.25 pack-year smoking history. She has never used smokeless tobacco. She reports that she does not drink alcohol or use drugs.   Family History:  [name] family history includes ALS in her brother; Alcohol abuse in her brother; Diabetes in her brother, brother, mother, sister, and sister; Heart disease in her mother;  Kidney disease in her mother; Multiple sclerosis in her daughter and another family member; Other (age of onset: 72) in her father.    ROS:  Please see the history of present illness.   Otherwise, review of systems are positive for sore throat.   All other systems are reviewed and negative.    PHYSICAL EXAM: VS:  BP 134/80  Pulse 70   Ht 5\' 3"  (1.6 m)   Wt 163 lb 6.4 oz (74.1 kg)   SpO2 94%   BMI 28.95 kg/m  , BMI Body mass index is 28.95 kg/m. GEN: Well nourished, well developed, in no acute distress  HEENT: normal  Neck: no JVD, carotid bruits, or masses Cardiac: RRR; no murmurs, rubs, or gallops,no edema  Respiratory:  clear to auscultation bilaterally, normal work of breathing GI: soft, nontender, nondistended, + BS MS: no deformity or atrophy  Skin: warm and dry, no rash Neuro:  Strength and sensation are intact Psych: euthymic mood, full affect   EKG:   The ekg ordered today demonstrates NSR, deep T wave inversions inferolaterally- no change from 2019 ECG   Recent Labs: 04/26/2018: ALT 15; BUN 16; Creatinine, Ser 1.05; Hemoglobin 14.5; Platelets 179.0; Potassium 3.8; Sodium 139   Lipid Panel No results found for: CHOL, TRIG, HDL, CHOLHDL, VLDL, LDLCALC, LDLDIRECT   Other studies Reviewed: Additional studies/ records that were reviewed today with results demonstrating: LDL 88; A1C 7.2.   ASSESSMENT AND PLAN:  1. Chest pain: negative stress test in the past.  Sx controlled.  2. DM: Continue insulin. Well controlled 3. Multiple sclerosis: Be careful to avoid falls. 4. Blood work by PMD.  5. HTN: Now with controlled BPs off of medicines.  Increase exercise avoid salt to help keep BP down.  No need for meds at this time, until BP readings increase. Would start with ACE-I since she has DM.   Current medicines are reviewed at length with the patient today.  The patient concerns regarding her medicines were addressed.  The following changes have been made:  No change   Labs/ tests ordered today include:  No orders of the defined types were placed in this encounter.   Recommend 150 minutes/week of aerobic exercise Low fat, low carb, high fiber diet recommended  Disposition:   FU in 1 year   Signed, Larae Grooms, MD  12/21/2018 1:45 PM    [facility] [place], [place], [place]  [address] Phone: [phone]; Fax: [phone]

## 2018-12-21 ENCOUNTER — Encounter: Payer: Self-pay | Admitting: Interventional Cardiology

## 2018-12-21 ENCOUNTER — Other Ambulatory Visit: Payer: Self-pay

## 2018-12-21 ENCOUNTER — Ambulatory Visit (INDEPENDENT_AMBULATORY_CARE_PROVIDER_SITE_OTHER): Payer: Medicare Other | Admitting: Interventional Cardiology

## 2018-12-21 VITALS — BP 134/80 | HR 70 | Ht 63.0 in | Wt 163.4 lb

## 2018-12-21 DIAGNOSIS — R072 Precordial pain: Secondary | ICD-10-CM

## 2018-12-21 DIAGNOSIS — E118 Type 2 diabetes mellitus with unspecified complications: Secondary | ICD-10-CM

## 2018-12-21 DIAGNOSIS — I10 Essential (primary) hypertension: Secondary | ICD-10-CM

## 2018-12-21 DIAGNOSIS — G35 Multiple sclerosis: Secondary | ICD-10-CM | POA: Diagnosis not present

## 2018-12-21 NOTE — Patient Instructions (Signed)

## 2018-12-30 ENCOUNTER — Other Ambulatory Visit: Payer: Self-pay

## 2018-12-30 DIAGNOSIS — Z1211 Encounter for screening for malignant neoplasm of colon: Secondary | ICD-10-CM

## 2019-01-10 DIAGNOSIS — M542 Cervicalgia: Secondary | ICD-10-CM | POA: Diagnosis not present

## 2019-01-10 DIAGNOSIS — M79672 Pain in left foot: Secondary | ICD-10-CM | POA: Diagnosis not present

## 2019-01-10 DIAGNOSIS — M797 Fibromyalgia: Secondary | ICD-10-CM | POA: Diagnosis not present

## 2019-01-10 DIAGNOSIS — M545 Low back pain: Secondary | ICD-10-CM | POA: Diagnosis not present

## 2019-01-10 DIAGNOSIS — M25519 Pain in unspecified shoulder: Secondary | ICD-10-CM | POA: Diagnosis not present

## 2019-01-10 DIAGNOSIS — G894 Chronic pain syndrome: Secondary | ICD-10-CM | POA: Diagnosis not present

## 2019-01-10 DIAGNOSIS — M79606 Pain in leg, unspecified: Secondary | ICD-10-CM | POA: Diagnosis not present

## 2019-01-10 DIAGNOSIS — E1140 Type 2 diabetes mellitus with diabetic neuropathy, unspecified: Secondary | ICD-10-CM | POA: Diagnosis not present

## 2019-01-10 DIAGNOSIS — Z79891 Long term (current) use of opiate analgesic: Secondary | ICD-10-CM | POA: Diagnosis not present

## 2019-01-11 DIAGNOSIS — R195 Other fecal abnormalities: Secondary | ICD-10-CM | POA: Diagnosis not present

## 2019-01-11 DIAGNOSIS — Z1211 Encounter for screening for malignant neoplasm of colon: Secondary | ICD-10-CM | POA: Diagnosis not present

## 2019-01-13 ENCOUNTER — Telehealth: Payer: Self-pay | Admitting: Gastroenterology

## 2019-01-13 NOTE — Telephone Encounter (Signed)
Reviewed her written instructions for the 2 day colon prep using Suprep.

## 2019-01-25 ENCOUNTER — Encounter (HOSPITAL_COMMUNITY): Payer: Self-pay | Admitting: *Deleted

## 2019-01-25 ENCOUNTER — Other Ambulatory Visit: Payer: Self-pay

## 2019-01-26 ENCOUNTER — Other Ambulatory Visit (HOSPITAL_COMMUNITY): Payer: Medicare Other

## 2019-01-26 DIAGNOSIS — R195 Other fecal abnormalities: Secondary | ICD-10-CM | POA: Diagnosis not present

## 2019-01-26 DIAGNOSIS — I2510 Atherosclerotic heart disease of native coronary artery without angina pectoris: Secondary | ICD-10-CM | POA: Diagnosis not present

## 2019-01-26 DIAGNOSIS — E119 Type 2 diabetes mellitus without complications: Secondary | ICD-10-CM | POA: Diagnosis not present

## 2019-01-26 DIAGNOSIS — Z794 Long term (current) use of insulin: Secondary | ICD-10-CM | POA: Diagnosis not present

## 2019-01-26 DIAGNOSIS — I10 Essential (primary) hypertension: Secondary | ICD-10-CM | POA: Diagnosis not present

## 2019-01-28 ENCOUNTER — Other Ambulatory Visit (HOSPITAL_COMMUNITY)
Admission: RE | Admit: 2019-01-28 | Discharge: 2019-01-28 | Disposition: A | Discharge status: Home / Self Care | Payer: Medicare Other | Source: Ambulatory Visit | Attending: Gastroenterology | Admitting: Gastroenterology

## 2019-01-28 DIAGNOSIS — Z20828 Contact with and (suspected) exposure to other viral communicable diseases: Secondary | ICD-10-CM | POA: Insufficient documentation

## 2019-01-28 DIAGNOSIS — Z01812 Encounter for preprocedural laboratory examination: Secondary | ICD-10-CM | POA: Diagnosis not present

## 2019-01-28 LAB — SARS CORONAVIRUS 2 (TAT 6-24 HRS): SARS Coronavirus 2: NEGATIVE

## 2019-01-31 ENCOUNTER — Ambulatory Visit (HOSPITAL_COMMUNITY)
Admission: RE | Admit: 2019-01-31 | Discharge: 2019-01-31 | Disposition: A | Discharge status: Home / Self Care | Payer: Medicare Other | Attending: Gastroenterology | Admitting: Gastroenterology

## 2019-01-31 ENCOUNTER — Ambulatory Visit (HOSPITAL_COMMUNITY): Payer: Medicare Other | Admitting: Anesthesiology

## 2019-01-31 ENCOUNTER — Encounter (HOSPITAL_COMMUNITY)
Admission: RE | Disposition: A | Discharge status: Home / Self Care | Payer: Self-pay | Source: Home / Self Care | Attending: Gastroenterology

## 2019-01-31 ENCOUNTER — Encounter (HOSPITAL_COMMUNITY): Payer: Self-pay | Admitting: *Deleted

## 2019-01-31 ENCOUNTER — Other Ambulatory Visit: Payer: Self-pay

## 2019-01-31 DIAGNOSIS — K648 Other hemorrhoids: Secondary | ICD-10-CM | POA: Insufficient documentation

## 2019-01-31 DIAGNOSIS — J45909 Unspecified asthma, uncomplicated: Secondary | ICD-10-CM | POA: Diagnosis not present

## 2019-01-31 DIAGNOSIS — Z1211 Encounter for screening for malignant neoplasm of colon: Secondary | ICD-10-CM

## 2019-01-31 DIAGNOSIS — I10 Essential (primary) hypertension: Secondary | ICD-10-CM | POA: Insufficient documentation

## 2019-01-31 DIAGNOSIS — K589 Irritable bowel syndrome without diarrhea: Secondary | ICD-10-CM | POA: Diagnosis not present

## 2019-01-31 DIAGNOSIS — Z8711 Personal history of peptic ulcer disease: Secondary | ICD-10-CM | POA: Insufficient documentation

## 2019-01-31 DIAGNOSIS — E119 Type 2 diabetes mellitus without complications: Secondary | ICD-10-CM | POA: Insufficient documentation

## 2019-01-31 DIAGNOSIS — Z79899 Other long term (current) drug therapy: Secondary | ICD-10-CM | POA: Diagnosis not present

## 2019-01-31 DIAGNOSIS — D125 Benign neoplasm of sigmoid colon: Secondary | ICD-10-CM | POA: Diagnosis not present

## 2019-01-31 DIAGNOSIS — M797 Fibromyalgia: Secondary | ICD-10-CM | POA: Diagnosis not present

## 2019-01-31 DIAGNOSIS — K5730 Diverticulosis of large intestine without perforation or abscess without bleeding: Secondary | ICD-10-CM | POA: Diagnosis not present

## 2019-01-31 DIAGNOSIS — G35 Multiple sclerosis: Secondary | ICD-10-CM | POA: Insufficient documentation

## 2019-01-31 DIAGNOSIS — Z86010 Personal history of colonic polyps: Secondary | ICD-10-CM

## 2019-01-31 DIAGNOSIS — Z8249 Family history of ischemic heart disease and other diseases of the circulatory system: Secondary | ICD-10-CM | POA: Insufficient documentation

## 2019-01-31 DIAGNOSIS — E785 Hyperlipidemia, unspecified: Secondary | ICD-10-CM | POA: Diagnosis not present

## 2019-01-31 DIAGNOSIS — K219 Gastro-esophageal reflux disease without esophagitis: Secondary | ICD-10-CM | POA: Diagnosis not present

## 2019-01-31 DIAGNOSIS — K635 Polyp of colon: Secondary | ICD-10-CM | POA: Diagnosis not present

## 2019-01-31 DIAGNOSIS — M1990 Unspecified osteoarthritis, unspecified site: Secondary | ICD-10-CM | POA: Diagnosis not present

## 2019-01-31 DIAGNOSIS — Z87891 Personal history of nicotine dependence: Secondary | ICD-10-CM | POA: Insufficient documentation

## 2019-01-31 DIAGNOSIS — Z20828 Contact with and (suspected) exposure to other viral communicable diseases: Secondary | ICD-10-CM | POA: Diagnosis not present

## 2019-01-31 DIAGNOSIS — E039 Hypothyroidism, unspecified: Secondary | ICD-10-CM | POA: Insufficient documentation

## 2019-01-31 DIAGNOSIS — Z8542 Personal history of malignant neoplasm of other parts of uterus: Secondary | ICD-10-CM | POA: Insufficient documentation

## 2019-01-31 DIAGNOSIS — D123 Benign neoplasm of transverse colon: Secondary | ICD-10-CM | POA: Diagnosis not present

## 2019-01-31 DIAGNOSIS — Z794 Long term (current) use of insulin: Secondary | ICD-10-CM | POA: Insufficient documentation

## 2019-01-31 HISTORY — PX: BIOPSY: SHX5522

## 2019-01-31 HISTORY — PX: POLYPECTOMY: SHX5525

## 2019-01-31 HISTORY — PX: COLONOSCOPY WITH PROPOFOL: SHX5780

## 2019-01-31 LAB — GLUCOSE, CAPILLARY: Glucose-Capillary: 141 mg/dL — ABNORMAL HIGH (ref 70–99)

## 2019-01-31 SURGERY — COLONOSCOPY WITH PROPOFOL
Anesthesia: Monitor Anesthesia Care

## 2019-01-31 MED ORDER — LACTATED RINGERS IV SOLN
INTRAVENOUS | Status: DC
  Administered 2019-01-31: 1000 mL via INTRAVENOUS

## 2019-01-31 MED ORDER — GLUCAGON HCL RDNA (DIAGNOSTIC) 1 MG IJ SOLR
INTRAMUSCULAR | Status: DC | PRN
  Administered 2019-01-31: .5 mg via INTRAVENOUS

## 2019-01-31 MED ORDER — ONDANSETRON HCL 4 MG/2ML IJ SOLN
INTRAMUSCULAR | Status: DC | PRN
  Administered 2019-01-31: 4 mg via INTRAVENOUS

## 2019-01-31 MED ORDER — PROPOFOL 10 MG/ML IV BOLUS
INTRAVENOUS | Status: DC | PRN
  Administered 2019-01-31: 20 mg via INTRAVENOUS
  Administered 2019-01-31: 30 mg via INTRAVENOUS

## 2019-01-31 MED ORDER — PROPOFOL 500 MG/50ML IV EMUL
INTRAVENOUS | Status: DC | PRN
  Administered 2019-01-31: 150 ug/kg/min via INTRAVENOUS

## 2019-01-31 MED ORDER — LIDOCAINE 2% (20 MG/ML) 5 ML SYRINGE
INTRAMUSCULAR | Status: DC | PRN
  Administered 2019-01-31: 75 mg via INTRAVENOUS

## 2019-01-31 MED ORDER — PROPOFOL 500 MG/50ML IV EMUL
INTRAVENOUS | Status: AC
  Filled 2019-01-31: qty 50

## 2019-01-31 MED ORDER — SODIUM CHLORIDE 0.9 % IV SOLN: INTRAVENOUS | Status: DC

## 2019-01-31 MED ORDER — GLUCAGON HCL RDNA (DIAGNOSTIC) 1 MG IJ SOLR
INTRAMUSCULAR | Status: AC
  Filled 2019-01-31: qty 1

## 2019-01-31 SURGICAL SUPPLY — 21 items

## 2019-01-31 NOTE — Op Note (Signed)
[facility] Patient Name: Courtney [name] Procedure Date: 01/31/2019 MRN: [id number] Attending MD: Mauri Pole , MD Date of Birth: Apr 17, 1955 CSN: [id number] Age: 63 Admit Type: Outpatient Procedure:                Colonoscopy Indications:              High risk colon cancer surveillance: Personal                            history of colonic polyps, High risk colon cancer                            surveillance: Personal history of adenoma less than                            10 mm in size Providers:                Mauri Pole, MD, Glori Bickers, RN, Lina Sar, Technician, [facility], CRNA Referring MD:              Medicines:                Monitored Anesthesia Care Complications:            No immediate complications. Estimated Blood Loss:     Estimated blood loss was minimal. Procedure:                Pre-Anesthesia Assessment:                           - Prior to the procedure, a History and Physical                            was performed, and patient medications and                            allergies were reviewed. [name] tolerance of                            previous anesthesia was also reviewed. The risks                            and benefits of the procedure and the sedation                            options and risks were discussed with the patient.                            All questions were answered, and informed consent                            was obtained. Prior Anticoagulants: The patient has  taken no previous anticoagulant or antiplatelet                            agents. ASA Grade Assessment: III - A patient with                            severe systemic disease. After reviewing the risks                            and benefits, the patient was deemed in                            satisfactory condition to undergo the procedure.            After obtaining informed consent, the colonoscope                            was passed under direct vision. Throughout the                            procedure, the patient's blood pressure, pulse, and                            oxygen saturations were monitored continuously. The                            PCF-H190DL ([id number] Olympus pediatric colonscope                            was introduced through the anus and advanced to the                            the cecum, identified by appendiceal orifice and                            ileocecal valve. The colonoscopy was performed                            without difficulty. The patient tolerated the                            procedure well. The quality of the bowel                            preparation was excellent. The ileocecal valve,                            appendiceal orifice, and rectum were photographed. Scope In: 9:56:29 AM Scope Out: 10:32:28 AM Scope Withdrawal Time: 0 hours 29 minutes 2 seconds  Total Procedure Duration: 0 hours 35 minutes 59 seconds  Findings:      The perianal and digital rectal examinations were normal.      A 12 mm polyp was found in the transverse colon. The polyp was       pedunculated. The polyp was removed  with a hot snare. Resection and       retrieval were complete.      A less than 1 mm polyp was found in the transverse colon. The polyp was       sessile. The polyp was removed with a cold biopsy forceps. Resection and       retrieval were complete.      Two sessile polyps were found in the sigmoid colon and transverse colon.       The polyps were 3 to 4 mm in size. These polyps were removed with a cold       snare. Resection and retrieval were complete.      Scattered small-mouthed diverticula were found in the sigmoid colon and       descending colon.      Non-bleeding internal hemorrhoids were found during retroflexion. The       hemorrhoids were small. Impression:                - One 12 mm polyp in the transverse colon, removed                            with a hot snare. Resected and retrieved.                           - One less than 1 mm polyp in the transverse colon,                            removed with a cold biopsy forceps. Resected and                            retrieved.                           - Two 3 to 4 mm polyps in the sigmoid colon and in                            the transverse colon, removed with a cold snare.                            Resected and retrieved.                           - Diverticulosis in the sigmoid colon and in the                            descending colon.                           - Non-bleeding internal hemorrhoids. Moderate Sedation:      Not Applicable - Patient had care per Anesthesia. Recommendation:           - Patient has a contact number available for                            emergencies. The signs and symptoms of potential  delayed complications were discussed with the                            patient. Return to normal activities tomorrow.                            Written discharge instructions were provided to the                            patient.                           - Resume previous diet.                           - Continue present medications.                           - Await pathology results.                           - Repeat colonoscopy in 3 - 5 years for                            surveillance based on pathology results. Procedure Code(s):        --- Professional ---                           [phone], Colonoscopy, flexible; with removal of                            tumor(s), polyp(s), or other lesion(s) by snare                            technique                           [id number], 56, Colonoscopy, flexible; with biopsy,                            single or multiple Diagnosis Code(s):        --- Professional ---                           K63.5, Polyp of  colon                           K64.8, Other hemorrhoids                           Z86.010, Personal history of colonic polyps                           K57.30, Diverticulosis of large intestine without                            perforation or abscess without bleeding CPT copyright 2019 American Medical Association.  All rights reserved. The codes documented in this report are preliminary and upon coder review may  be revised to meet current compliance requirements. Mauri Pole, MD 01/31/2019 10:39:03 AM This report has been signed electronically. Number of Addenda: 0

## 2019-01-31 NOTE — H&P (Signed)
[facility] History and Physical   Primary Care Physician:  Jenel Lucks, PA-C   Reason for Procedure:  History of colon polyps  Plan:    Colonoscopy with possible intervention     HPI: Courtney [name] is a 63 y.o. female here for surveillance colonoscopy.  Denies any nausea, vomiting, abdominal pain, melena or bright red blood per rectum  The risks and benefits as well as alternatives of endoscopic procedure(s) have been discussed and reviewed. All questions answered. The patient agrees to proceed.    Past Medical History:  Diagnosis Date  . Abnormal ECG 07/18/2015  . Arthritis   . Asthma   . Bowel obstruction ([place])   . Depression with anxiety 02/09/2015   mild xanax prn  . Diabetes mellitus ([place]) 07/18/2015   type 2  . Diverticulitis   . Dysrhythmia    abnormal ekgs worked up  . Essential tremor 03/31/2016  . Fibromyalgia   . Gait difficulty 02/09/2015  . Gallstones   . GERD (gastroesophageal reflux disease)   . HLD (hyperlipidemia)   . Hypersomnia 11/09/2015  . Hypertension    no meds  . Hyperthyroidism    patient states has no problems at present-01/25/2019  . Hypothyroidism    patient states no problems at present 01/25/2019  . IBS (irritable bowel syndrome)   . Insomnia 02/09/2015  . Migraines   . MS (multiple sclerosis) ([place])   . Multiple joint pain 03/31/2016  . Numbness in both hands 03/18/2016  . Other fatigue 02/09/2015  . Peptic ulcer   . Pneumonia   . PONV (postoperative nausea and vomiting)   . Urinary hesitancy 02/09/2015  . Vision abnormalities     Past Surgical History:  Procedure Laterality Date  . ABDOMINAL HYSTERECTOMY  1976   including oophorectomy, due to uterine cancer  . APPENDECTOMY  1974  . CARPAL TUNNEL RELEASE Left    carpal tunnel surgery  . CHOLECYSTECTOMY     2006  . COLON SURGERY     SBO x 2  . EUS N/A 04/09/2017   Procedure: UPPER ENDOSCOPIC ULTRASOUND (EUS) LINEAR;  Surgeon: Milus Banister,  MD;  Location: [facility];  Service: Endoscopy;  Laterality: N/A;  . HERNIA REPAIR    . MENISCUS REPAIR Left   . SMALL INTESTINE SURGERY    . toenail removal Right 12/2017   great toenail     Prior to Admission medications   Medication Sig Start Date End Date Taking? Authorizing Provider  acetaminophen (TYLENOL) 500 MG tablet Take 1,000 mg by mouth every 8 (eight) hours as needed for headache.   Yes [provider]  alprazolam Duanne Moron) 2 MG tablet Take 2 mg by mouth 3 (three) times daily as needed for anxiety.    Yes [provider]  amLODipine (NORVASC) 5 MG tablet Take 5 mg by mouth at bedtime. 01/14/19  Yes [provider]  insulin aspart protamine- aspart (NOVOLOG MIX 70/30) (70-30) 100 UNIT/ML injection Inject 18-20 Units into the skin 2 (two) times daily. Inject into skin 18 unit with breakfast, and 20 units with supper daily.   Yes [provider]  Multiple Vitamin (MULTIVITAMIN) tablet Take 2 tablets by mouth daily. Gummies   Yes [provider]  naloxone (NARCAN) nasal spray 4 mg/0.1 mL Place 1 spray into the nose daily as needed (overdose).   Yes [provider]  Oxycodone HCl 20 MG TABS Take 20 mg by mouth every 4 (four) hours as needed (pain).  04/17/18  Yes [provider]  pantoprazole (PROTONIX) 40 MG tablet Take 40 mg by mouth daily as needed (acid reflux).    Yes [provider]  triamcinolone (NASACORT) 55 MCG/ACT AERO nasal inhaler Place 1 spray into the nose daily as needed (allergies).    Yes [provider]  albuterol (PROVENTIL HFA;VENTOLIN HFA) 108 (90 Base) MCG/ACT inhaler Inhale 2 puffs into the lungs every 6 (six) hours as needed for wheezing or shortness of breath. 12/19/15   Whiteheart, Cristal Ford, NP  butalbital-acetaminophen-caffeine (FIORICET, ESGIC) 50-325-40 MG tablet Take 1 tablet by mouth 2 (two) times daily as needed for headache.     [provider]  empagliflozin  (JARDIANCE) 10 MG TABS tablet Take 10 mg by mouth daily.    [provider]  lidocaine (XYLOCAINE) 5 % ointment Apply 1 application topically daily as needed for mild pain.    [provider]  modafinil (PROVIGIL) 100 MG tablet Take 50 mg by mouth daily as needed (to stay awake).  11/22/18   [provider]  oxyCODONE (OXYCONTIN) 10 mg 12 hr tablet Take 10 mg by mouth every 12 (twelve) hours.  10/16/17   [provider]    Current Facility-Administered Medications  Medication Dose Route Frequency Provider Last Rate Last Dose  . 0.9 %  sodium chloride infusion   Intravenous Continuous Mauri Pole, MD        Allergies as of 12/30/2018 - Review Complete 12/21/2018  Allergen Reaction Noted  . Ibuprofen Other (See Comments) 10/20/2015  . Prednisone Anaphylaxis, Shortness Of Breath, and Palpitations 02/03/2015  . Solu-medrol [methylprednisolone] Anaphylaxis, Shortness Of Breath, and Palpitations 02/03/2015  . Bactrim [sulfamethoxazole-trimethoprim] Other (See Comments) 02/03/2015  . Biaxin [clarithromycin] Swelling 02/03/2015  . Naproxen Other (See Comments) 10/20/2015  . Sulfa antibiotics Other (See Comments) 03/03/2012    Family History  Problem Relation Age of Onset  . Heart disease Mother   . Kidney disease Mother   . Diabetes Mother   . Other Father 34       murdered  . Multiple sclerosis Daughter   . Multiple sclerosis Other   . Diabetes Sister   . Alcohol abuse Brother        ETOH and marijuana  . Diabetes Brother   . Diabetes Sister   . Diabetes Brother   . ALS Brother     Social History   Socioeconomic History  . Marital status: Married    Spouse name: Jimmie  . Number of children: 4  . Years of education: Not on file  . Highest education level: 12th grade  Occupational History  . Occupation: disabled  Social Needs  . Financial resource strain: Not on file  . Food insecurity    Worry: Not on file    Inability: Not on  file  . Transportation needs    Medical: Not on file    Non-medical: Not on file  Tobacco Use  . Smoking status: Former Smoker    Packs/day: 0.25    Years: 5.00    Pack years: 1.25    Types: Cigarettes    Quit date: 1990    Years since quitting: 30.9  . Smokeless tobacco: Never Used  Substance and Sexual Activity  . Alcohol use: Yes    Frequency: Never    Comment: rarely  . Drug use: No  . Sexual activity: Yes  Lifestyle  . Physical activity    Days per week: Not on file    Minutes per session:  Not on file  . Stress: Not on file  Relationships  . Social Herbalist on phone: Not on file    Gets together: Not on file    Attends religious service: Not on file    Active member of club or organization: Not on file    Attends meetings of clubs or organizations: Not on file    Relationship status: Not on file  . Intimate partner violence    Fear of current or ex partner: Not on file    Emotionally abused: Not on file    Physically abused: Not on file    Forced sexual activity: Not on file  Other Topics Concern  . Not on file  Social History Narrative   Pt is right-handed. She lives with her husband in a 2 story house. Pt avoids caffeine. She and her husband go to the gym, lately once weekly, previously 3 times a week.     Review of Systems:  All other review of systems negative except as mentioned in the HPI.  Physical Exam: Vital signs in last 24 hours: Temp:  [98.5 F (36.9 C)] 98.5 F (36.9 C) (11/30 0851) Pulse Rate:  [74] 74 (11/30 0851) Resp:  [14] 14 (11/30 0851) BP: (159)/(90) 159/90 (11/30 0851) SpO2:  [100 %] 100 % (11/30 0851) Weight:  [72.6 kg] 72.6 kg (11/30 0851)   General:   Alert,  Well-developed, well-nourished, pleasant and cooperative in NAD Lungs:  Clear throughout to auscultation.   Heart:  Regular rate and rhythm; no murmurs, clicks, rubs,  or gallops. Abdomen:  Soft, nontender and nondistended. Normal bowel sounds.   Neuro/Psych:   Alert and cooperative. Normal mood and affect. A and O x 3   K. Denzil Magnuson , MD [phone]

## 2019-01-31 NOTE — Anesthesia Preprocedure Evaluation (Addendum)
Anesthesia Evaluation  Patient identified by MRN, date of birth, ID band Patient awake    Reviewed: Allergy & Precautions, NPO status , Patient's Chart, lab work & pertinent test results  History of Anesthesia Complications (+) PONV and history of anesthetic complications  Airway Mallampati: II  TM Distance: >3 FB Neck ROM: Full    Dental  (+) Teeth Intact   Pulmonary asthma , former smoker,    Pulmonary exam normal        Cardiovascular hypertension, Pt. on medications Normal cardiovascular exam     Neuro/Psych  Headaches, PSYCHIATRIC DISORDERS Anxiety Depression  Neuromuscular disease (MS)    GI/Hepatic Neg liver ROS, PUD, GERD  Medicated and Controlled, IBS    Endo/Other  diabetes, Type 2, Insulin Dependent  Renal/GU negative Renal ROS     Musculoskeletal  (+) Arthritis , Fibromyalgia -, narcotic dependent  Abdominal   Peds  Hematology negative hematology ROS (+)   Anesthesia Other Findings Covid negative 11/27   Reproductive/Obstetrics                            Anesthesia Physical Anesthesia Plan  ASA: III  Anesthesia Plan: MAC   Post-op Pain Management:    Induction: Intravenous  PONV Risk Score and Plan: 3 and Propofol infusion and Treatment may vary due to age or medical condition  Airway Management Planned: Nasal Cannula and Natural Airway  Additional Equipment: None  Intra-op Plan:   Post-operative Plan:   Informed Consent: I have reviewed the patients History and Physical, chart, labs and discussed the procedure including the risks, benefits and alternatives for the proposed anesthesia with the patient or authorized representative who has indicated his/her understanding and acceptance.       Plan Discussed with: CRNA and Anesthesiologist  Anesthesia Plan Comments:        Anesthesia Quick Evaluation

## 2019-01-31 NOTE — Anesthesia Postprocedure Evaluation (Signed)
Anesthesia Post Note  Patient: Courtney [name]  Procedure(s) Performed: COLONOSCOPY WITH PROPOFOL (N/A ) POLYPECTOMY     Patient location during evaluation: PACU Anesthesia Type: MAC Level of consciousness: awake and alert Pain management: pain level controlled Vital Signs Assessment: post-procedure vital signs reviewed and stable Respiratory status: spontaneous breathing, nonlabored ventilation and respiratory function stable Cardiovascular status: stable and blood pressure returned to baseline Anesthetic complications: no    Last Vitals:  Vitals:   01/31/19 1040 01/31/19 1050  BP: 106/63 (!) 112/59  Pulse: 80 71  Resp: 19 12  Temp: 36.5 C   SpO2: 100% 99%    Last Pain:  Vitals:   01/31/19 1040  TempSrc: Temporal  PainSc: [name]

## 2019-01-31 NOTE — Discharge Instructions (Signed)

## 2019-01-31 NOTE — Transfer of Care (Signed)
Immediate Anesthesia Transfer of Care Note  Patient: Courtney [name]  Procedure(s) Performed: COLONOSCOPY WITH PROPOFOL (N/A ) POLYPECTOMY  Patient Location: PACU  Anesthesia Type:MAC  Level of Consciousness: awake, alert  and oriented  Airway & Oxygen Therapy: Patient Spontanous Breathing  Post-op Assessment: Report given to RN and Post -op Vital signs reviewed and stable  Post vital signs: Reviewed and stable  Last Vitals:  Vitals Value Taken Time  BP 106/63 01/31/19 1040  Temp    Pulse 77 01/31/19 1040  Resp 15 01/31/19 1040  SpO2 99 % 01/31/19 1040  Vitals shown include unvalidated device data.  Last Pain:  Vitals:   01/31/19 1040  TempSrc: (P) Temporal  PainSc:          Complications: No apparent anesthesia complications

## 2019-02-01 ENCOUNTER — Encounter: Payer: Self-pay | Admitting: Gastroenterology

## 2019-02-01 ENCOUNTER — Encounter (HOSPITAL_COMMUNITY): Payer: Self-pay | Admitting: Gastroenterology

## 2019-02-01 DIAGNOSIS — F411 Generalized anxiety disorder: Secondary | ICD-10-CM | POA: Diagnosis not present

## 2019-02-01 DIAGNOSIS — G35 Multiple sclerosis: Secondary | ICD-10-CM | POA: Diagnosis not present

## 2019-02-01 DIAGNOSIS — R5383 Other fatigue: Secondary | ICD-10-CM | POA: Diagnosis not present

## 2019-02-01 LAB — SURGICAL PATHOLOGY

## 2019-02-07 DIAGNOSIS — G894 Chronic pain syndrome: Secondary | ICD-10-CM | POA: Diagnosis not present

## 2019-02-07 DIAGNOSIS — M79672 Pain in left foot: Secondary | ICD-10-CM | POA: Diagnosis not present

## 2019-02-07 DIAGNOSIS — M545 Low back pain: Secondary | ICD-10-CM | POA: Diagnosis not present

## 2019-02-07 DIAGNOSIS — M542 Cervicalgia: Secondary | ICD-10-CM | POA: Diagnosis not present

## 2019-02-07 DIAGNOSIS — M79606 Pain in leg, unspecified: Secondary | ICD-10-CM | POA: Diagnosis not present

## 2019-02-07 DIAGNOSIS — M797 Fibromyalgia: Secondary | ICD-10-CM | POA: Diagnosis not present

## 2019-02-07 DIAGNOSIS — M25519 Pain in unspecified shoulder: Secondary | ICD-10-CM | POA: Diagnosis not present

## 2019-02-07 DIAGNOSIS — Z79891 Long term (current) use of opiate analgesic: Secondary | ICD-10-CM | POA: Diagnosis not present

## 2019-02-07 DIAGNOSIS — E1140 Type 2 diabetes mellitus with diabetic neuropathy, unspecified: Secondary | ICD-10-CM | POA: Diagnosis not present

## 2019-02-11 ENCOUNTER — Other Ambulatory Visit: Payer: Self-pay | Admitting: Anesthesiology

## 2019-02-11 DIAGNOSIS — M5416 Radiculopathy, lumbar region: Secondary | ICD-10-CM

## 2019-02-11 DIAGNOSIS — M545 Low back pain, unspecified: Secondary | ICD-10-CM

## 2019-03-03 ENCOUNTER — Ambulatory Visit
Admission: EM | Admit: 2019-03-03 | Discharge: 2019-03-03 | Disposition: A | Discharge status: Home / Self Care | Payer: Medicare Other | Attending: Physician Assistant | Admitting: Physician Assistant

## 2019-03-03 ENCOUNTER — Ambulatory Visit (INDEPENDENT_AMBULATORY_CARE_PROVIDER_SITE_OTHER): Payer: Medicare Other

## 2019-03-03 ENCOUNTER — Other Ambulatory Visit: Payer: Self-pay

## 2019-03-03 DIAGNOSIS — S61314A Laceration without foreign body of right ring finger with damage to nail, initial encounter: Secondary | ICD-10-CM | POA: Diagnosis not present

## 2019-03-03 DIAGNOSIS — W230XXA Caught, crushed, jammed, or pinched between moving objects, initial encounter: Secondary | ICD-10-CM | POA: Diagnosis not present

## 2019-03-03 DIAGNOSIS — S62324A Displaced fracture of shaft of fourth metacarpal bone, right hand, initial encounter for closed fracture: Secondary | ICD-10-CM

## 2019-03-03 DIAGNOSIS — M79644 Pain in right finger(s): Secondary | ICD-10-CM | POA: Diagnosis not present

## 2019-03-03 DIAGNOSIS — M7989 Other specified soft tissue disorders: Secondary | ICD-10-CM | POA: Diagnosis not present

## 2019-03-03 MED ORDER — MUPIROCIN 2 % EX OINT: 1.0000 "application " | TOPICAL_OINTMENT | Freq: Two times a day (BID) | CUTANEOUS | 0 refills | Status: DC

## 2019-03-03 MED ORDER — CEPHALEXIN 500 MG PO CAPS: 500.0000 mg | ORAL_CAPSULE | Freq: Four times a day (QID) | ORAL | 0 refills | Status: DC

## 2019-03-03 NOTE — ED Triage Notes (Signed)
Pt states slammed her rt 4th digit in a door. The nail is spit open, bleeding is controlled

## 2019-03-03 NOTE — Discharge Instructions (Signed)
4 absorbable sutures placed. You can remove current dressing in 24 hours. Keep wound clean and dry. You can clean gently with soap and water. Do not soak area in water. Redress daily with bactroban. Start keflex as directed. Monitor for spreading redness, increased warmth, increased swelling, fever, follow up for reevaluation needed. Otherwise follow up with hand next week for reevaluation and further monitoring needed.

## 2019-03-03 NOTE — ED Provider Notes (Signed)
[facility]    CSN: [id number] Arrival date & time: 03/03/19  1254      History   Chief Complaint Chief Complaint  Patient presents with  . Finger Injury    HPI Courtney [name] is a 63 y.o. female.   62 year old female comes in for right ring finger laceration after closing car door to the finger. Bleeding controlled with pressure. Laceration is to the nailbed. Denies numbness/tingling. Denies decrease in ROM.      Past Medical History:  Diagnosis Date  . Abnormal ECG 07/18/2015  . Arthritis   . Asthma   . Bowel obstruction ([place])   . Depression with anxiety 02/09/2015   mild xanax prn  . Diabetes mellitus ([place]) 07/18/2015   type 2  . Diverticulitis   . Dysrhythmia    abnormal ekgs worked up  . Essential tremor 03/31/2016  . Fibromyalgia   . Gait difficulty 02/09/2015  . Gallstones   . GERD (gastroesophageal reflux disease)   . HLD (hyperlipidemia)   . Hypersomnia 11/09/2015  . Hypertension    no meds  . Hyperthyroidism    patient states has no problems at present-01/25/2019  . Hypothyroidism    patient states no problems at present 01/25/2019  . IBS (irritable bowel syndrome)   . Insomnia 02/09/2015  . Migraines   . MS (multiple sclerosis) ([place])   . Multiple joint pain 03/31/2016  . Numbness in both hands 03/18/2016  . Other fatigue 02/09/2015  . Peptic ulcer   . Pneumonia   . PONV (postoperative nausea and vomiting)   . Urinary hesitancy 02/09/2015  . Vision abnormalities     Patient Active Problem List   Diagnosis Date Noted  . Polyp of transverse colon   . Special screening for malignant neoplasms, colon   . History of colonic polyps   . Polyp of sigmoid colon   . Cervicalgia 08/10/2018  . 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 Loss of weight   . RLQ abdominal pain 03/27/2017  . Bloating 03/27/2017  . Abnormal finding on imaging 03/27/2017  . Dilation of biliary tract 03/27/2017  . Nausea 03/27/2017  . Multiple joint  pain 03/31/2016  . Essential tremor 03/31/2016  . Numbness in both hands 03/18/2016  . Hypersomnia 11/09/2015  . Diabetes mellitus ([place]) 07/18/2015  . Essential hypertension 07/18/2015  . Abnormal ECG 07/18/2015  . Gait difficulty 02/09/2015  . Chronic left shoulder pain 02/09/2015  . Depression with anxiety 02/09/2015  . Numbness 02/09/2015  . Insomnia 02/09/2015  . Urinary hesitancy 02/09/2015  . Other fatigue 02/09/2015    Past Surgical History:  Procedure Laterality Date  . ABDOMINAL HYSTERECTOMY  1976   including oophorectomy, due to uterine cancer  . APPENDECTOMY  1974  . BIOPSY  01/31/2019   Procedure: BIOPSY;  Surgeon: Mauri Pole, MD;  Location: [facility];  Service: Endoscopy;;  . CARPAL TUNNEL RELEASE Left    carpal tunnel surgery  . CHOLECYSTECTOMY     2006  . COLON SURGERY     SBO x 2  . COLONOSCOPY WITH PROPOFOL N/A 01/31/2019   Procedure: COLONOSCOPY WITH PROPOFOL;  Surgeon: Mauri Pole, MD;  Location: [facility];  Service: Endoscopy;  Laterality: N/A;  . EUS N/A 04/09/2017   Procedure: UPPER ENDOSCOPIC ULTRASOUND (EUS) LINEAR;  Surgeon: Milus Banister, MD;  Location: [facility];  Service: Endoscopy;  Laterality: N/A;  . HERNIA REPAIR    . MENISCUS REPAIR Left   .  POLYPECTOMY  01/31/2019   Procedure: POLYPECTOMY;  Surgeon: Mauri Pole, MD;  Location: [facility];  Service: Endoscopy;;  . SMALL INTESTINE SURGERY    . toenail removal Right 12/2017   great toenail     OB History   No obstetric history on file.      Home Medications    Prior to Admission medications   Medication Sig Start Date End Date Taking? Authorizing Provider  acetaminophen (TYLENOL) 500 MG tablet Take 1,000 mg by mouth every 8 (eight) hours as needed for headache.    [provider]  albuterol (PROVENTIL HFA;VENTOLIN HFA) 108 (90 Base) MCG/ACT inhaler Inhale 2 puffs into the lungs every 6 (six) hours as needed for wheezing or shortness of  breath. 12/19/15   Whiteheart, Cristal Ford, NP  alprazolam Duanne Moron) 2 MG tablet Take 2 mg by mouth 3 (three) times daily as needed for anxiety.     [provider]  amLODipine (NORVASC) 5 MG tablet Take 5 mg by mouth at bedtime. 01/14/19   [provider]  butalbital-acetaminophen-caffeine (FIORICET, ESGIC) 50-325-40 MG tablet Take 1 tablet by mouth 2 (two) times daily as needed for headache.     [provider]  cephALEXin (KEFLEX) 500 MG capsule Take 1 capsule (500 mg total) by mouth 4 (four) times daily. 03/03/19   Tasia Catchings, Kolin Erdahl V, PA-C  empagliflozin (JARDIANCE) 10 MG TABS tablet Take 10 mg by mouth daily.    [provider]  insulin aspart protamine- aspart (NOVOLOG MIX 70/30) (70-30) 100 UNIT/ML injection Inject 18-20 Units into the skin 2 (two) times daily. Inject into skin 18 unit with breakfast, and 20 units with supper daily.    [provider]  lidocaine (XYLOCAINE) 5 % ointment Apply 1 application topically daily as needed for mild pain.    [provider]  modafinil (PROVIGIL) 100 MG tablet Take 50 mg by mouth daily as needed (to stay awake).  11/22/18   [provider]  Multiple Vitamin (MULTIVITAMIN) tablet Take 2 tablets by mouth daily. Gummies    [provider]  mupirocin ointment (BACTROBAN) 2 % Apply 1 application topically 2 (two) times daily. 03/03/19   Tasia Catchings, Hansen Carino V, PA-C  naloxone [facility]) nasal spray 4 mg/0.1 mL Place 1 spray into the nose daily as needed (overdose).    [provider]  oxyCODONE (OXYCONTIN) 10 mg 12 hr tablet Take 10 mg by mouth every 12 (twelve) hours.  10/16/17   [provider]  Oxycodone HCl 20 MG TABS Take 20 mg by mouth every 4 (four) hours as needed (pain).  04/17/18   [provider]  pantoprazole (PROTONIX) 40 MG tablet Take 40 mg by mouth daily as needed (acid reflux).     [provider]  triamcinolone (NASACORT) 55 MCG/ACT AERO nasal inhaler Place 1 spray  into the nose daily as needed (allergies).     [provider]    Family History Family History  Problem Relation Age of Onset  . Heart disease Mother   . Kidney disease Mother   . Diabetes Mother   . Other Father 64       murdered  . Multiple sclerosis Daughter   . Multiple sclerosis Other   . Diabetes Sister   . Alcohol abuse Brother        ETOH and marijuana  . Diabetes Brother   . Diabetes Sister   . Diabetes Brother   . ALS Brother     Social History Social  History   Tobacco Use  . Smoking status: Former Smoker    Packs/day: 0.25    Years: 5.00    Pack years: 1.25    Types: Cigarettes    Quit date: 1990    Years since quitting: 31.0  . Smokeless tobacco: Never Used  Substance Use Topics  . Alcohol use: Yes    Comment: rarely  . Drug use: No     Allergies   Ibuprofen, Prednisone, Solu-medrol [methylprednisolone], Bactrim [sulfamethoxazole-trimethoprim], Biaxin [clarithromycin], Naproxen, and Sulfa antibiotics   Review of Systems Review of Systems  Reason unable to perform ROS: See HPI as above.     Physical Exam Triage Vital Signs ED Triage Vitals  Enc Vitals Group     BP 03/03/19 1356 (!) 149/92     Pulse Rate 03/03/19 1356 74     Resp 03/03/19 1356 18     Temp 03/03/19 1356 98.1 F (36.7 C)     Temp Source 03/03/19 1356 Oral     SpO2 03/03/19 1356 98 %     Weight --      Height --      Head Circumference --      Peak Flow --      Pain Score 03/03/19 1357 6     Pain Loc --      Pain Edu? --      Excl. in [place]? --    No data found.  Updated Vital Signs BP (!) 149/92 (BP Location: Left Arm)   Pulse 74   Temp 98.1 F (36.7 C) (Oral)   Resp 18   SpO2 98%   Physical Exam Constitutional:      General: She is not in acute distress.    Appearance: She is well-developed. She is not diaphoretic.  HENT:     Head: Normocephalic and atraumatic.  Eyes:     Conjunctiva/sclera: Conjunctivae normal.     Pupils: Pupils are equal,  round, and reactive to light.  Pulmonary:     Effort: Pulmonary effort is normal. No respiratory distress.  Musculoskeletal:     Comments: 0.7cm laceration across the right ring finger nailbed. Bleeding controlled with pressure. No swelling, contusion. Area tender to palpation. Full ROM of finger. NVI  Skin:    General: Skin is warm and dry.  Neurological:     Mental Status: She is alert and oriented to person, place, and time.      UC Treatments / Results  Labs (all labs ordered are listed, but only abnormal results are displayed) Labs Reviewed - No data to display  EKG   Radiology DG Finger Ring Right  Result Date: 03/03/2019 CLINICAL DATA:  Pain EXAM: RIGHT RING FINGER 2+V COMPARISON:  None. FINDINGS: There is a lucency through the tuft of the distal phalanx, suspicious for nondisplaced fracture. There is no radiopaque foreign body. There is surrounding soft tissue swelling. IMPRESSION: Findings suspicious for a nondisplaced fracture of the tuft of the distal phalanx with surrounding soft tissue swelling. Electronically Signed   By: Constance Holster M.D.   On: 03/03/2019 14:54    Procedures Nail Removal  Date/Time: 03/03/2019 11:29 PM Performed by: Ok Edwards, PA-C Authorized by: Ok Edwards, PA-C   Consent:    Consent obtained:  Verbal   Consent given by:  Patient   Risks discussed:  Bleeding, incomplete removal, infection, pain and permanent nail deformity   Alternatives discussed:  Referral and alternative treatment Location:    Hand:  R ring finger  Pre-procedure details:    Skin preparation:  ChloraPrep Anesthesia (see MAR for exact dosages):    Anesthesia method:  Nerve block   Block needle gauge:  27 G   Block anesthetic:  Lidocaine 2% w/o epi   Block injection procedure:  Anatomic landmarks identified, introduced needle, incremental injection, anatomic landmarks palpated and negative aspiration for blood   Block outcome:  Anesthesia achieved Nail Removal:     Nail removed:  Partial   Nail removed location: nailbed distal to laceration was completely removed.  Nail partially removed proximal to the laceration    Nail bed repaired: yes (0.7cm laceration)     Nail bed repair material:  5-0 chromic gut   Number of sutures:  4 Post-procedure details:    Dressing:  Antibiotic ointment, gauze roll and splint   Patient tolerance of procedure:  Tolerated well, no immediate complications   (including critical care time)  Medications Ordered in UC Medications - No data to display  Initial Impression / Assessment and Plan / UC Course  I have reviewed the triage vital signs and the nursing notes.  Pertinent labs & imaging results that were available during my care of the patient were reviewed by me and considered in my medical decision making (see chart for details).    Will obtain xray for possible open fracture. Otherwise will remove nail for nailbed laceration repair.  Patient tolerated procedure well. 4 absorbable simple interrupted sutures placed. Discussed xray results with patient. No obvious bone exposure when repairing laceration. However, will cover for possible open fracture with keflex. Wound care instructions given. Return precautions given. Otherwise, follow up with hand orthopedics for monitoring and further management needed. Patient expresses understanding and agrees to plan.   Final Clinical Impressions(s) / UC Diagnoses   Final diagnoses:  Laceration of right ring finger without foreign body with damage to nail, initial encounter    ED Prescriptions    Medication Sig Dispense Auth. Provider   cephALEXin (KEFLEX) 500 MG capsule Take 1 capsule (500 mg total) by mouth 4 (four) times daily. 28 capsule Cru Kritikos V, PA-C   mupirocin ointment (BACTROBAN) 2 % Apply 1 application topically 2 (two) times daily. 22 g Ok Edwards, PA-C     PDMP not reviewed this encounter.   Ok Edwards, PA-C 03/03/19 2337

## 2019-03-05 ENCOUNTER — Ambulatory Visit
Admission: RE | Admit: 2019-03-05 | Discharge: 2019-03-05 | Disposition: A | Discharge status: Home / Self Care | Payer: Medicare Other | Source: Ambulatory Visit | Attending: Anesthesiology | Admitting: Anesthesiology

## 2019-03-05 ENCOUNTER — Other Ambulatory Visit: Payer: Self-pay

## 2019-03-05 DIAGNOSIS — M545 Low back pain, unspecified: Secondary | ICD-10-CM

## 2019-03-05 DIAGNOSIS — M48061 Spinal stenosis, lumbar region without neurogenic claudication: Secondary | ICD-10-CM | POA: Diagnosis not present

## 2019-03-05 DIAGNOSIS — M5416 Radiculopathy, lumbar region: Secondary | ICD-10-CM

## 2019-03-09 ENCOUNTER — Ambulatory Visit: Payer: Medicare Other | Admitting: Orthopaedic Surgery

## 2019-03-09 DIAGNOSIS — M545 Low back pain: Secondary | ICD-10-CM | POA: Diagnosis not present

## 2019-03-09 DIAGNOSIS — M79672 Pain in left foot: Secondary | ICD-10-CM | POA: Diagnosis not present

## 2019-03-09 DIAGNOSIS — M25519 Pain in unspecified shoulder: Secondary | ICD-10-CM | POA: Diagnosis not present

## 2019-03-09 DIAGNOSIS — Z79891 Long term (current) use of opiate analgesic: Secondary | ICD-10-CM | POA: Diagnosis not present

## 2019-03-09 DIAGNOSIS — M79606 Pain in leg, unspecified: Secondary | ICD-10-CM | POA: Diagnosis not present

## 2019-03-09 DIAGNOSIS — M797 Fibromyalgia: Secondary | ICD-10-CM | POA: Diagnosis not present

## 2019-03-09 DIAGNOSIS — M542 Cervicalgia: Secondary | ICD-10-CM | POA: Diagnosis not present

## 2019-03-09 DIAGNOSIS — G894 Chronic pain syndrome: Secondary | ICD-10-CM | POA: Diagnosis not present

## 2019-03-09 DIAGNOSIS — E1140 Type 2 diabetes mellitus with diabetic neuropathy, unspecified: Secondary | ICD-10-CM | POA: Diagnosis not present

## 2019-03-11 ENCOUNTER — Ambulatory Visit (INDEPENDENT_AMBULATORY_CARE_PROVIDER_SITE_OTHER): Payer: Medicare Other | Admitting: Orthopaedic Surgery

## 2019-03-11 ENCOUNTER — Other Ambulatory Visit: Payer: Self-pay

## 2019-03-11 ENCOUNTER — Encounter: Payer: Self-pay | Admitting: Orthopaedic Surgery

## 2019-03-11 VITALS — Ht 63.0 in | Wt 160.0 lb

## 2019-03-11 DIAGNOSIS — S61314A Laceration without foreign body of right ring finger with damage to nail, initial encounter: Secondary | ICD-10-CM | POA: Diagnosis not present

## 2019-03-11 MED ORDER — CEPHALEXIN 500 MG PO CAPS: 500.0000 mg | ORAL_CAPSULE | Freq: Four times a day (QID) | ORAL | 0 refills | Status: DC

## 2019-03-11 NOTE — Progress Notes (Signed)
Office Visit Note   Patient: Courtney [name]           Date of Birth: 09/30/1955           MRN: [id number] Visit Date: 03/11/2019              Requested by: Jenel Lucks, [place],  [place] [id number] PCP: Jenel Lucks, PA-C   Assessment & Plan: Visit Diagnoses:  1. Laceration of right ring finger without foreign body with damage to nail, initial encounter     Plan: Impression is right ring finger nailbed laceration tuft fracture.  We will clean this today.  We will also apply Bactroban ointment and a dry bandage.  She will do this twice daily.  We will extend her Keflex for another week.  We will also apply an AlumaFoam splint.  Follow-up with [place] in 1 week's time for recheck.  Call with concerns or questions.  Follow-Up Instructions: Return in about 2 weeks (around 03/25/2019).   Orders:  No orders of the defined types were placed in this encounter.  Meds ordered this encounter  Medications  . cephALEXin (KEFLEX) 500 MG capsule    Sig: Take 1 capsule (500 mg total) by mouth 4 (four) times daily.    Dispense:  28 capsule    Refill:  0      Procedures: No procedures performed   Clinical Data: No additional findings.   Subjective: Chief Complaint  Patient presents with  . Right Hand - Pain    Right ring finger laceration DOS 03/03/2019    HPI patient is a pleasant 64 year old right handed diabetic who presents our clinic today following an injury to her right ring finger.  This occurred on 03/03/2019.  She slammed her right ring finger in a car door.  She was seen in urgent care setting where it was noted that she had a nailbed laceration.  This was sutured with chromic gut.  X-rays were also obtained which showed a tuft fracture.  She was given Keflex which she finished yesterday.  No fevers or chills.  She has been applying Bactroban ointment to the wound once daily.  She does have moderate pain, numbness and tingling to  the distal finger.  Review of Systems as detailed in HPI.  All others reviewed and are negative.   Objective: Vital Signs: Ht 5\' 3"  (1.6 m)   Wt 160 lb (72.6 kg)   BMI 28.34 kg/m   Physical Exam well-developed well-nourished female no acute distress.  Alert and oriented x3.  Ortho Exam examination of the right ring finger reveals no evidence of infection or cellulitis to the nailbed or fingertip.  She does have decreased sensation to the fingertip.  This is moderately tender as well.  Specialty Comments:  No specialty comments available.  Imaging: No new imaging   PMFS History: Patient Active Problem List   Diagnosis Date Noted  . Polyp of transverse colon   . Special screening for malignant neoplasms, colon   . History of colonic polyps   . Polyp of sigmoid colon   . Cervicalgia 08/10/2018  . 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 Loss of weight   . RLQ abdominal pain 03/27/2017  . Bloating 03/27/2017  . Abnormal finding on imaging 03/27/2017  . Dilation of biliary tract 03/27/2017  . Nausea 03/27/2017  . Multiple joint pain 03/31/2016  . Essential tremor 03/31/2016  . Numbness in both hands 03/18/2016  .  Hypersomnia 11/09/2015  . Diabetes mellitus ([facility]) 07/18/2015  . Essential hypertension 07/18/2015  . Abnormal ECG 07/18/2015  . Gait difficulty 02/09/2015  . Chronic left shoulder pain 02/09/2015  . Depression with anxiety 02/09/2015  . Numbness 02/09/2015  . Insomnia 02/09/2015  . Urinary hesitancy 02/09/2015  . Other fatigue 02/09/2015   Past Medical History:  Diagnosis Date  . Abnormal ECG 07/18/2015  . Arthritis   . Asthma   . Bowel obstruction ([place])   . Depression with anxiety 02/09/2015   mild xanax prn  . Diabetes mellitus ([place]) 07/18/2015   type 2  . Diverticulitis   . Dysrhythmia    abnormal ekgs worked up  . Essential tremor 03/31/2016  . Fibromyalgia   . Gait difficulty 02/09/2015  . Gallstones   . GERD (gastroesophageal reflux  disease)   . HLD (hyperlipidemia)   . Hypersomnia 11/09/2015  . Hypertension    no meds  . Hyperthyroidism    patient states has no problems at present-01/25/2019  . Hypothyroidism    patient states no problems at present 01/25/2019  . IBS (irritable bowel syndrome)   . Insomnia 02/09/2015  . Migraines   . MS (multiple sclerosis) ([place])   . Multiple joint pain 03/31/2016  . Numbness in both hands 03/18/2016  . Other fatigue 02/09/2015  . Peptic ulcer   . Pneumonia   . PONV (postoperative nausea and vomiting)   . Urinary hesitancy 02/09/2015  . Vision abnormalities     Family History  Problem Relation Age of Onset  . Heart disease Mother   . Kidney disease Mother   . Diabetes Mother   . Other Father 30       murdered  . Multiple sclerosis Daughter   . Multiple sclerosis Other   . Diabetes Sister   . Alcohol abuse Brother        ETOH and marijuana  . Diabetes Brother   . Diabetes Sister   . Diabetes Brother   . ALS Brother     Past Surgical History:  Procedure Laterality Date  . ABDOMINAL HYSTERECTOMY  1976   including oophorectomy, due to uterine cancer  . APPENDECTOMY  1974  . BIOPSY  01/31/2019   Procedure: BIOPSY;  Surgeon: Mauri Pole, MD;  [facility];  Service: Endoscopy;;  . CARPAL TUNNEL RELEASE Left    carpal tunnel surgery  . CHOLECYSTECTOMY     2006  . COLON SURGERY     SBO x 2  . COLONOSCOPY WITH PROPOFOL N/A 01/31/2019   Procedure: COLONOSCOPY WITH PROPOFOL;  Surgeon: Mauri Pole, MD;  [facility];  Service: Endoscopy;  Laterality: N/A;  . EUS N/A 04/09/2017   Procedure: UPPER ENDOSCOPIC ULTRASOUND (EUS) LINEAR;  Surgeon: Milus Banister, MD;  [facility];  Service: Endoscopy;  Laterality: N/A;  . HERNIA REPAIR    . MENISCUS REPAIR Left   . POLYPECTOMY  01/31/2019   Procedure: POLYPECTOMY;  Surgeon: Mauri Pole, MD;  [facility];  Service: Endoscopy;;  . SMALL INTESTINE SURGERY    .  toenail removal Right 12/2017   great toenail    Social History   Occupational History  . Occupation: disabled  Tobacco Use  . Smoking status: Former Smoker    Packs/day: 0.25    Years: 5.00    Pack years: 1.25    Types: Cigarettes    Quit date: 1990    Years since quitting: 31.0  . Smokeless tobacco: Never Used  Substance  and Sexual Activity  . Alcohol use: Yes    Comment: rarely  . Drug use: No  . Sexual activity: Yes

## 2019-03-15 ENCOUNTER — Other Ambulatory Visit: Payer: Self-pay

## 2019-03-15 ENCOUNTER — Ambulatory Visit (INDEPENDENT_AMBULATORY_CARE_PROVIDER_SITE_OTHER): Payer: Medicare Other | Admitting: Orthopaedic Surgery

## 2019-03-15 ENCOUNTER — Encounter: Payer: Self-pay | Admitting: Orthopaedic Surgery

## 2019-03-15 DIAGNOSIS — S62634A Displaced fracture of distal phalanx of right ring finger, initial encounter for closed fracture: Secondary | ICD-10-CM | POA: Insufficient documentation

## 2019-03-15 DIAGNOSIS — S62664A Nondisplaced fracture of distal phalanx of right ring finger, initial encounter for closed fracture: Secondary | ICD-10-CM

## 2019-03-15 DIAGNOSIS — M47816 Spondylosis without myelopathy or radiculopathy, lumbar region: Secondary | ICD-10-CM

## 2019-03-15 NOTE — Progress Notes (Signed)
Office Visit Note   Patient: Courtney [name]           Date of Birth: 28-Nov-1955           MRN: [id number] Visit Date: 03/15/2019              Requested by: Jenel Lucks, [place],  [place] [address] PCP: Jenel Lucks, PA-C   Assessment & Plan: Visit Diagnoses:  1. Closed nondisplaced fracture of distal phalanx of right ring finger, initial encounter   2. Facet arthritis, degenerative, lumbar spine    Face-to-face time spent with patient was greater than 30 minutes.  Greater than 50% of the time was spent in counseling and coordination of care.  Plan:  #1: She will keep the mupirocin and the Band-Aid on the right ring finger with this finger splint.  She will need to follow back up with Mendel Ryder for follow-up. #2: She is not interested in any type of corticosteroid injections of the lumbar spine.   #3: Follow back up as needed if she would like to have corticosteroid injections   Follow-Up Instructions: Return if symptoms worsen or fail to improve.   Orders:  No orders of the defined types were placed in this encounter.  No orders of the defined types were placed in this encounter.     Procedures: No procedures performed   Clinical Data: No additional findings.   Subjective: Chief Complaint  Patient presents with  . Lower Back - Pain  . Right Hand - Follow-up  Patient presents today for lower back pain. She had an MRI on 03/05/19, ordered by her pain management. She has been having pain in her lower back for two months. The pain is more right sided. She has been wearing a copper bracelet and notices improvement. She has a lower back injection scheduled for 04/06/19 with her pain management, but states that she is going to cancel it.  She is taking oxycodone 20mg  three to four times daily. She also wants to have her right ring finger checked. She was here last Friday and saw Dr.Xu for a laceration. The laceration happened on  03/03/2019. She is wearing a splint on her finger. She was ask to follow up this week but already had an appointment with Dr.Whitfield for her back.  She states that she is really not interested in having any type of injections to the lumbar spine.  HPI  Review of Systems  Constitutional: Negative for fatigue.  HENT: Negative for ear pain.   Eyes: Negative for pain.  Respiratory: Negative for shortness of breath.   Cardiovascular: Negative for leg swelling.  Gastrointestinal: Positive for constipation.  Endocrine: Negative for cold intolerance and heat intolerance.  Genitourinary: Negative for difficulty urinating.  Musculoskeletal: Negative for joint swelling.  Skin: Negative for rash.  Allergic/Immunologic: Negative for food allergies.  Neurological: Negative for weakness.  Hematological: Does not bruise/bleed easily.  Psychiatric/Behavioral: Positive for sleep disturbance.     Objective: Vital Signs: Ht 5\' 3"  (1.6 m)   Wt 160 lb (72.6 kg)   BMI 28.34 kg/m   Physical Exam Constitutional:      Appearance: Normal appearance. She is well-developed.  HENT:     Head: Normocephalic.  Eyes:     Pupils: Pupils are equal, round, and reactive to light.  Pulmonary:     Effort: Pulmonary effort is normal.  Skin:    General: Skin is warm and dry.  Neurological:  Mental Status: She is alert and oriented to person, place, and time.  Psychiatric:        Mood and Affect: Mood normal.        Behavior: Behavior normal.        Thought Content: Thought content normal.        Judgment: Judgment normal.     Ortho Exam  Exam today of the right ring finger reveals sutures in place and the nailbed.  Little bit of hypertrophy over the area of of the closure.  Seal appears to be sealed.  She has good capillary refill.  Sensation is intact.  Exam today lumbar spine does reveal some tenderness to palpation over the lumbar spine.  Deep tendon reflexes were 2+ in the knee absent in the  bilateral ankles.  She has good strength bilaterally and equal in the lower extremities.  Sensation is intact to light touch.  Calf is supple nontender.  Specialty Comments:  No specialty comments available.  Imaging:  MR LUMBAR SPINE WO CONTRAST  Result Date: 03/05/2019 CLINICAL DATA:  Initial evaluation for right-sided back pain extending into the right lower extremity with associated numbness status post fall 2 months ago. EXAM: MRI LUMBAR SPINE WITHOUT CONTRAST TECHNIQUE: Multiplanar, multisequence MR imaging of the lumbar spine was performed. No intravenous contrast was administered. COMPARISON:  Previous MRI from 04/16/2015. FINDINGS: Segmentation: Standard. Lowest well-formed disc space labeled the L5-S1 level. Alignment: Physiologic with preservation of the normal lumbar lordosis. No listhesis. Vertebrae: Vertebral body height maintained without evidence for acute or chronic fracture. Bone marrow signal intensity within normal limits. Few scattered subcentimeter benign hemangiomata noted. No worrisome osseous lesions. No abnormal marrow edema. Conus medullaris and cauda equina: Conus extends to the L1 level. Conus and cauda equina appear normal. Paraspinal and other soft tissues: Paraspinous soft tissues within normal limits. Visualized visceral structures are normal. Disc levels: L1-2:  Unremarkable. L2-3:  Unremarkable. L3-4: No significant disc bulge. Mild to moderate facet hypertrophy. No stenosis or impingement. L4-5: Minimal disc bulging, slightly asymmetric to the right. Moderate facet and ligament flavum hypertrophy. Resultant mild canal with right greater than left lateral recess stenosis, mildly progressed from previous. Foramina remain patent. No impingement. L5-S1: Normal interspace. Mild to moderate facet hypertrophy. No stenosis or impingement. IMPRESSION: 1. Minimal disc bulging with moderate facet hypertrophy at L4-5 with resultant mild canal and right worse than left lateral recess  stenosis. Descending right L5 nerve root could potentially be affected. 2. Additional mild to moderate facet hypertrophy at L3-4 and L5-S1 without stenosis or impingement. 3. No other significant degenerative disc disease, stenosis, or evidence for impingement. Electronically Signed   By: Jeannine Boga M.D.   On: 03/05/2019 21:38     PMFS History: Current Outpatient Medications  Medication Sig Dispense Refill  . acetaminophen (TYLENOL) 500 MG tablet Take 1,000 mg by mouth every 8 (eight) hours as needed for headache.    . albuterol (PROVENTIL HFA;VENTOLIN HFA) 108 (90 Base) MCG/ACT inhaler Inhale 2 puffs into the lungs every 6 (six) hours as needed for wheezing or shortness of breath. 3 Inhaler 3  . alprazolam (XANAX) 2 MG tablet Take 2 mg by mouth 3 (three) times daily as needed for anxiety.     Marland Kitchen amLODipine (NORVASC) 5 MG tablet Take 5 mg by mouth at bedtime.    . butalbital-acetaminophen-caffeine (FIORICET, ESGIC) 50-325-40 MG tablet Take 1 tablet by mouth 2 (two) times daily as needed for headache.     . cephALEXin (KEFLEX) 500  MG capsule Take 1 capsule (500 mg total) by mouth 4 (four) times daily. 28 capsule 0  . empagliflozin (JARDIANCE) 10 MG TABS tablet Take 10 mg by mouth daily.    . insulin aspart protamine- aspart (NOVOLOG MIX 70/30) (70-30) 100 UNIT/ML injection Inject 18-20 Units into the skin 2 (two) times daily. Inject into skin 18 unit with breakfast, and 20 units with supper daily.    Marland Kitchen lidocaine (XYLOCAINE) 5 % ointment Apply 1 application topically daily as needed for mild pain.    . modafinil (PROVIGIL) 100 MG tablet Take 50 mg by mouth daily as needed (to stay awake).     . Multiple Vitamin (MULTIVITAMIN) tablet Take 2 tablets by mouth daily. Gummies    . mupirocin ointment (BACTROBAN) 2 % Apply 1 application topically 2 (two) times daily. 22 g 0  . naloxone (NARCAN) nasal spray 4 mg/0.1 mL Place 1 spray into the nose daily as needed (overdose).    Marland Kitchen oxyCODONE  (OXYCONTIN) 10 mg 12 hr tablet Take 10 mg by mouth every 12 (twelve) hours.   0  . Oxycodone HCl 20 MG TABS Take 20 mg by mouth every 4 (four) hours as needed (pain).     . pantoprazole (PROTONIX) 40 MG tablet Take 40 mg by mouth daily as needed (acid reflux).     . triamcinolone (NASACORT) 55 MCG/ACT AERO nasal inhaler Place 1 spray into the nose daily as needed (allergies).      No current facility-administered medications for this visit.    Patient Active Problem List   Diagnosis Date Noted  . Fracture of distal phalanx of right ring finger 03/15/2019  . 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 Polyp of transverse colon   . Special screening for malignant neoplasms, colon   . History of colonic polyps   . Polyp of sigmoid colon   . Cervicalgia 08/10/2018  . 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 Loss of weight   . RLQ abdominal pain 03/27/2017  . Bloating 03/27/2017  . Abnormal finding on imaging 03/27/2017  . Dilation of biliary tract 03/27/2017  . Nausea 03/27/2017  . Multiple joint pain 03/31/2016  . Essential tremor 03/31/2016  . Numbness in both hands 03/18/2016  . Hypersomnia 11/09/2015  . Diabetes mellitus ([place]) 07/18/2015  . Essential hypertension 07/18/2015  . Abnormal ECG 07/18/2015  . Gait difficulty 02/09/2015  . Chronic left shoulder pain 02/09/2015  . Depression with anxiety 02/09/2015  . Numbness 02/09/2015  . Insomnia 02/09/2015  . Urinary hesitancy 02/09/2015  . Other fatigue 02/09/2015   Past Medical History:  Diagnosis Date  . Abnormal ECG 07/18/2015  . Arthritis   . Asthma   . Bowel obstruction ([place])   . Depression with anxiety 02/09/2015   mild xanax prn  . Diabetes mellitus ([place]) 07/18/2015   type 2  . Diverticulitis   . Dysrhythmia    abnormal ekgs worked up  . Essential tremor 03/31/2016  . Fibromyalgia   . Gait difficulty 02/09/2015  . Gallstones   . GERD (gastroesophageal reflux disease)   . HLD  (hyperlipidemia)   . Hypersomnia 11/09/2015  . Hypertension    no meds  . Hyperthyroidism    patient states has no problems at present-01/25/2019  . Hypothyroidism    patient states no problems at present 01/25/2019  . IBS (irritable bowel syndrome)   . Insomnia 02/09/2015  . Migraines   . MS (multiple sclerosis) ([place])   . Multiple joint pain 03/31/2016  .  Numbness in both hands 03/18/2016  . Other fatigue 02/09/2015  . Peptic ulcer   . Pneumonia   . PONV (postoperative nausea and vomiting)   . Urinary hesitancy 02/09/2015  . Vision abnormalities     Family History  Problem Relation Age of Onset  . Heart disease Mother   . Kidney disease Mother   . Diabetes Mother   . Other Father 55       murdered  . Multiple sclerosis Daughter   . Multiple sclerosis Other   . Diabetes Sister   . Alcohol abuse Brother        ETOH and marijuana  . Diabetes Brother   . Diabetes Sister   . Diabetes Brother   . ALS Brother     Past Surgical History:  Procedure Laterality Date  . ABDOMINAL HYSTERECTOMY  1976   including oophorectomy, due to uterine cancer  . APPENDECTOMY  1974  . BIOPSY  01/31/2019   Procedure: BIOPSY;  Surgeon: Mauri Pole, MD;  Location: [facility];  Service: Endoscopy;;  . CARPAL TUNNEL RELEASE Left    carpal tunnel surgery  . CHOLECYSTECTOMY     2006  . COLON SURGERY     SBO x 2  . COLONOSCOPY WITH PROPOFOL N/A 01/31/2019   Procedure: COLONOSCOPY WITH PROPOFOL;  Surgeon: Mauri Pole, MD;  Location: [facility];  Service: Endoscopy;  Laterality: N/A;  . EUS N/A 04/09/2017   Procedure: UPPER ENDOSCOPIC ULTRASOUND (EUS) LINEAR;  Surgeon: Milus Banister, MD;  Location: [facility];  Service: Endoscopy;  Laterality: N/A;  . HERNIA REPAIR    . MENISCUS REPAIR Left   . POLYPECTOMY  01/31/2019   Procedure: POLYPECTOMY;  Surgeon: Mauri Pole, MD;  Location: [facility];  Service: Endoscopy;;  . SMALL INTESTINE SURGERY    . toenail removal Right  12/2017   great toenail    Social History   Occupational History  . Occupation: disabled  Tobacco Use  . Smoking status: Former Smoker    Packs/day: 0.25    Years: 5.00    Pack years: 1.25    Types: Cigarettes    Quit date: 1990    Years since quitting: 31.0  . Smokeless tobacco: Never Used  Substance and Sexual Activity  . Alcohol use: Yes    Comment: rarely  . Drug use: No  . Sexual activity: Yes

## 2019-03-16 DIAGNOSIS — Z86010 Personal history of colonic polyps: Secondary | ICD-10-CM | POA: Insufficient documentation

## 2019-03-16 DIAGNOSIS — Z8639 Personal history of other endocrine, nutritional and metabolic disease: Secondary | ICD-10-CM | POA: Diagnosis not present

## 2019-03-16 DIAGNOSIS — K219 Gastro-esophageal reflux disease without esophagitis: Secondary | ICD-10-CM | POA: Diagnosis not present

## 2019-03-16 DIAGNOSIS — I2510 Atherosclerotic heart disease of native coronary artery without angina pectoris: Secondary | ICD-10-CM | POA: Diagnosis not present

## 2019-03-16 DIAGNOSIS — Z1231 Encounter for screening mammogram for malignant neoplasm of breast: Secondary | ICD-10-CM | POA: Diagnosis not present

## 2019-03-16 DIAGNOSIS — R591 Generalized enlarged lymph nodes: Secondary | ICD-10-CM | POA: Diagnosis not present

## 2019-03-16 DIAGNOSIS — E119 Type 2 diabetes mellitus without complications: Secondary | ICD-10-CM | POA: Diagnosis not present

## 2019-03-16 DIAGNOSIS — Z794 Long term (current) use of insulin: Secondary | ICD-10-CM | POA: Diagnosis not present

## 2019-03-16 DIAGNOSIS — Z87891 Personal history of nicotine dependence: Secondary | ICD-10-CM | POA: Diagnosis not present

## 2019-03-16 DIAGNOSIS — M797 Fibromyalgia: Secondary | ICD-10-CM | POA: Diagnosis not present

## 2019-03-16 DIAGNOSIS — I10 Essential (primary) hypertension: Secondary | ICD-10-CM | POA: Diagnosis not present

## 2019-03-29 DIAGNOSIS — G4700 Insomnia, unspecified: Secondary | ICD-10-CM | POA: Diagnosis not present

## 2019-03-29 DIAGNOSIS — F411 Generalized anxiety disorder: Secondary | ICD-10-CM | POA: Diagnosis not present

## 2019-03-29 DIAGNOSIS — G35 Multiple sclerosis: Secondary | ICD-10-CM | POA: Diagnosis not present

## 2019-04-13 DIAGNOSIS — M797 Fibromyalgia: Secondary | ICD-10-CM | POA: Diagnosis not present

## 2019-04-13 DIAGNOSIS — M545 Low back pain: Secondary | ICD-10-CM | POA: Diagnosis not present

## 2019-04-13 DIAGNOSIS — E1140 Type 2 diabetes mellitus with diabetic neuropathy, unspecified: Secondary | ICD-10-CM | POA: Diagnosis not present

## 2019-04-13 DIAGNOSIS — M79672 Pain in left foot: Secondary | ICD-10-CM | POA: Diagnosis not present

## 2019-04-13 DIAGNOSIS — M79606 Pain in leg, unspecified: Secondary | ICD-10-CM | POA: Diagnosis not present

## 2019-04-13 DIAGNOSIS — G894 Chronic pain syndrome: Secondary | ICD-10-CM | POA: Diagnosis not present

## 2019-04-13 DIAGNOSIS — Z79891 Long term (current) use of opiate analgesic: Secondary | ICD-10-CM | POA: Diagnosis not present

## 2019-04-13 DIAGNOSIS — M542 Cervicalgia: Secondary | ICD-10-CM | POA: Diagnosis not present

## 2019-04-13 DIAGNOSIS — M25519 Pain in unspecified shoulder: Secondary | ICD-10-CM | POA: Diagnosis not present

## 2019-04-26 ENCOUNTER — Encounter: Payer: Self-pay | Admitting: Emergency Medicine

## 2019-04-26 ENCOUNTER — Ambulatory Visit
Admission: EM | Admit: 2019-04-26 | Discharge: 2019-04-26 | Disposition: A | Discharge status: Home / Self Care | Payer: Medicare Other | Attending: Emergency Medicine | Admitting: Emergency Medicine

## 2019-04-26 ENCOUNTER — Other Ambulatory Visit: Payer: Self-pay

## 2019-04-26 DIAGNOSIS — Z4802 Encounter for removal of sutures: Secondary | ICD-10-CM | POA: Diagnosis not present

## 2019-04-26 DIAGNOSIS — F411 Generalized anxiety disorder: Secondary | ICD-10-CM | POA: Diagnosis not present

## 2019-04-26 DIAGNOSIS — G35 Multiple sclerosis: Secondary | ICD-10-CM | POA: Diagnosis not present

## 2019-04-26 DIAGNOSIS — G4700 Insomnia, unspecified: Secondary | ICD-10-CM | POA: Diagnosis not present

## 2019-04-26 DIAGNOSIS — Z79899 Other long term (current) drug therapy: Secondary | ICD-10-CM | POA: Diagnosis not present

## 2019-04-26 NOTE — ED Triage Notes (Signed)
Pt presents to [facility] for assessment as two of the absorbable sutures that were placed still had not absorbed/been removed from her laceration from December.  Area is well healed, finger nail is growing back.  Sutures are pale and already split, patient just afraid to pull them out.  Area cleaned with alcohol and both sutures removed (patient states other two removed at home).  Bandaid place and patient discharged.

## 2019-05-03 DIAGNOSIS — R413 Other amnesia: Secondary | ICD-10-CM | POA: Insufficient documentation

## 2019-05-03 DIAGNOSIS — R269 Unspecified abnormalities of gait and mobility: Secondary | ICD-10-CM | POA: Diagnosis not present

## 2019-05-03 DIAGNOSIS — G250 Essential tremor: Secondary | ICD-10-CM | POA: Diagnosis not present

## 2019-05-17 ENCOUNTER — Ambulatory Visit: Payer: Medicare Other | Admitting: Orthopaedic Surgery

## 2019-05-18 DIAGNOSIS — E1140 Type 2 diabetes mellitus with diabetic neuropathy, unspecified: Secondary | ICD-10-CM | POA: Diagnosis not present

## 2019-05-18 DIAGNOSIS — M79672 Pain in left foot: Secondary | ICD-10-CM | POA: Diagnosis not present

## 2019-05-18 DIAGNOSIS — M542 Cervicalgia: Secondary | ICD-10-CM | POA: Diagnosis not present

## 2019-05-18 DIAGNOSIS — M797 Fibromyalgia: Secondary | ICD-10-CM | POA: Diagnosis not present

## 2019-05-18 DIAGNOSIS — G894 Chronic pain syndrome: Secondary | ICD-10-CM | POA: Diagnosis not present

## 2019-05-18 DIAGNOSIS — M79606 Pain in leg, unspecified: Secondary | ICD-10-CM | POA: Diagnosis not present

## 2019-05-18 DIAGNOSIS — M25519 Pain in unspecified shoulder: Secondary | ICD-10-CM | POA: Diagnosis not present

## 2019-05-18 DIAGNOSIS — M545 Low back pain: Secondary | ICD-10-CM | POA: Diagnosis not present

## 2019-05-18 DIAGNOSIS — Z79891 Long term (current) use of opiate analgesic: Secondary | ICD-10-CM | POA: Diagnosis not present

## 2019-05-24 DIAGNOSIS — R413 Other amnesia: Secondary | ICD-10-CM | POA: Diagnosis not present

## 2019-05-24 DIAGNOSIS — R251 Tremor, unspecified: Secondary | ICD-10-CM | POA: Diagnosis not present

## 2019-05-24 DIAGNOSIS — G250 Essential tremor: Secondary | ICD-10-CM | POA: Diagnosis not present

## 2019-05-25 DIAGNOSIS — M79641 Pain in right hand: Secondary | ICD-10-CM | POA: Diagnosis not present

## 2019-05-25 DIAGNOSIS — M19041 Primary osteoarthritis, right hand: Secondary | ICD-10-CM | POA: Diagnosis not present

## 2019-05-25 DIAGNOSIS — R2232 Localized swelling, mass and lump, left upper limb: Secondary | ICD-10-CM | POA: Diagnosis not present

## 2019-05-25 DIAGNOSIS — Z888 Allergy status to other drugs, medicaments and biological substances status: Secondary | ICD-10-CM | POA: Diagnosis not present

## 2019-05-25 DIAGNOSIS — M25532 Pain in left wrist: Secondary | ICD-10-CM | POA: Diagnosis not present

## 2019-05-25 DIAGNOSIS — E119 Type 2 diabetes mellitus without complications: Secondary | ICD-10-CM | POA: Diagnosis not present

## 2019-05-29 IMAGING — CR DG SINUSES COMPLETE 3+V
4 series · 4 of 4 positions shown · non-contrast
Comparison: None.

CLINICAL DATA: Facial pain for months

EXAM:
PARANASAL SINUSES - COMPLETE 3 + VIEW

[[person_name] pa]
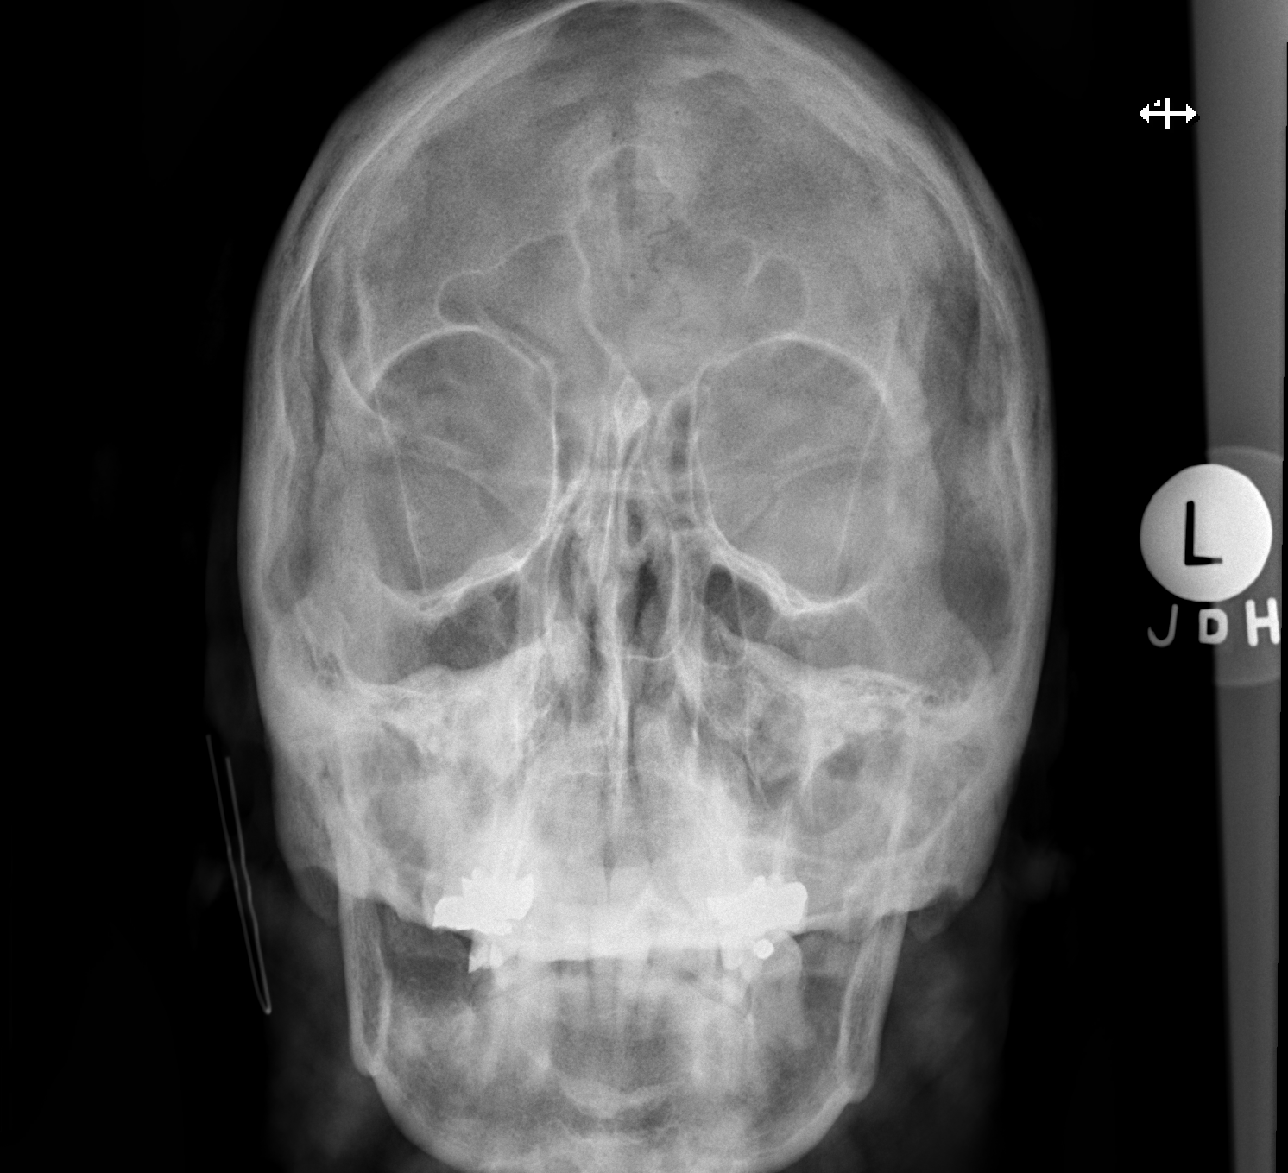

[w waters pa]
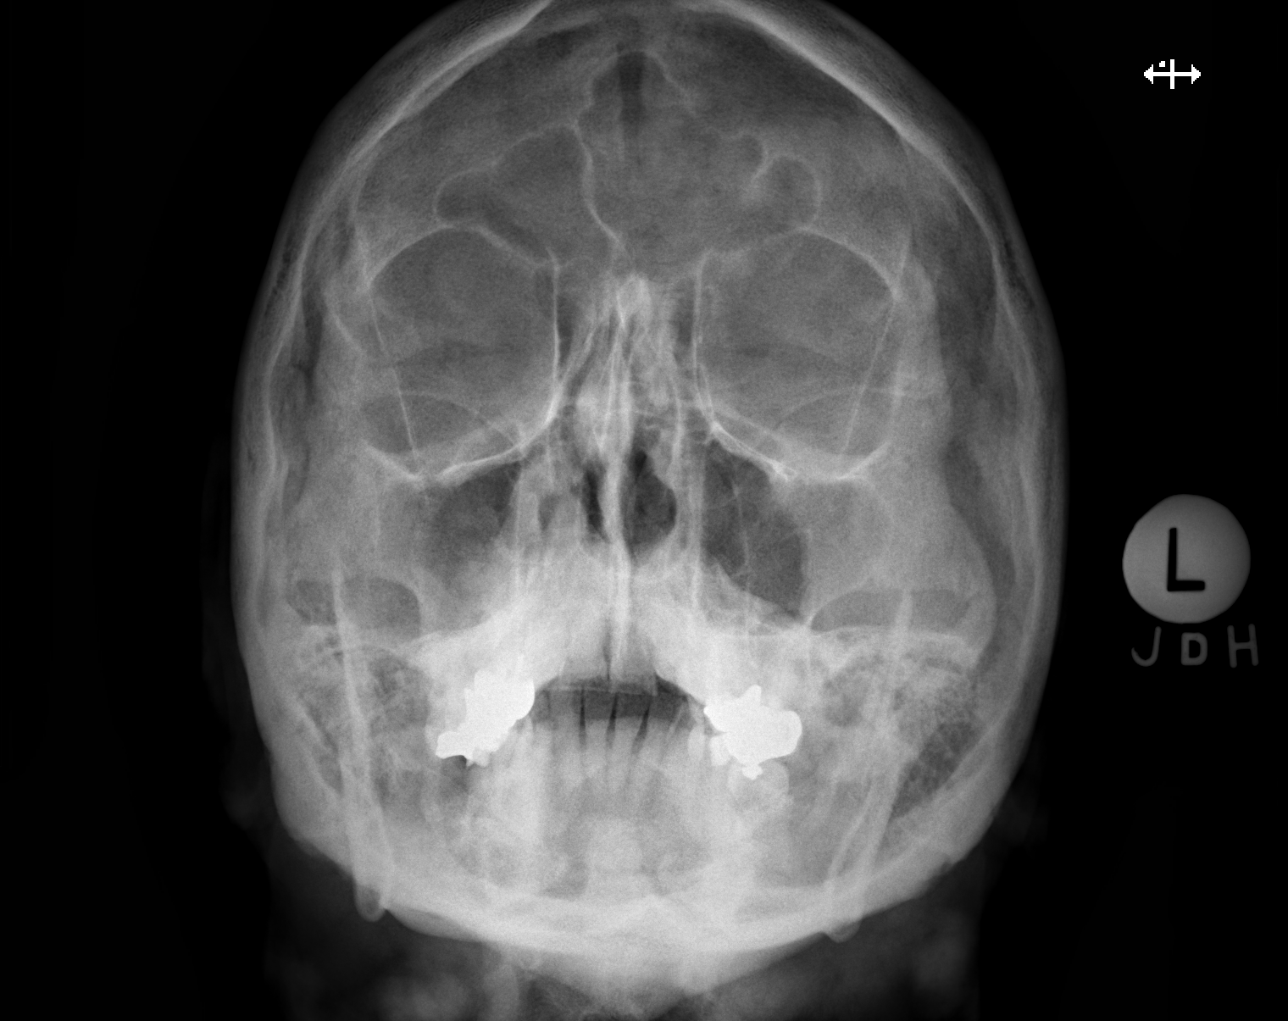

[w skull lat]
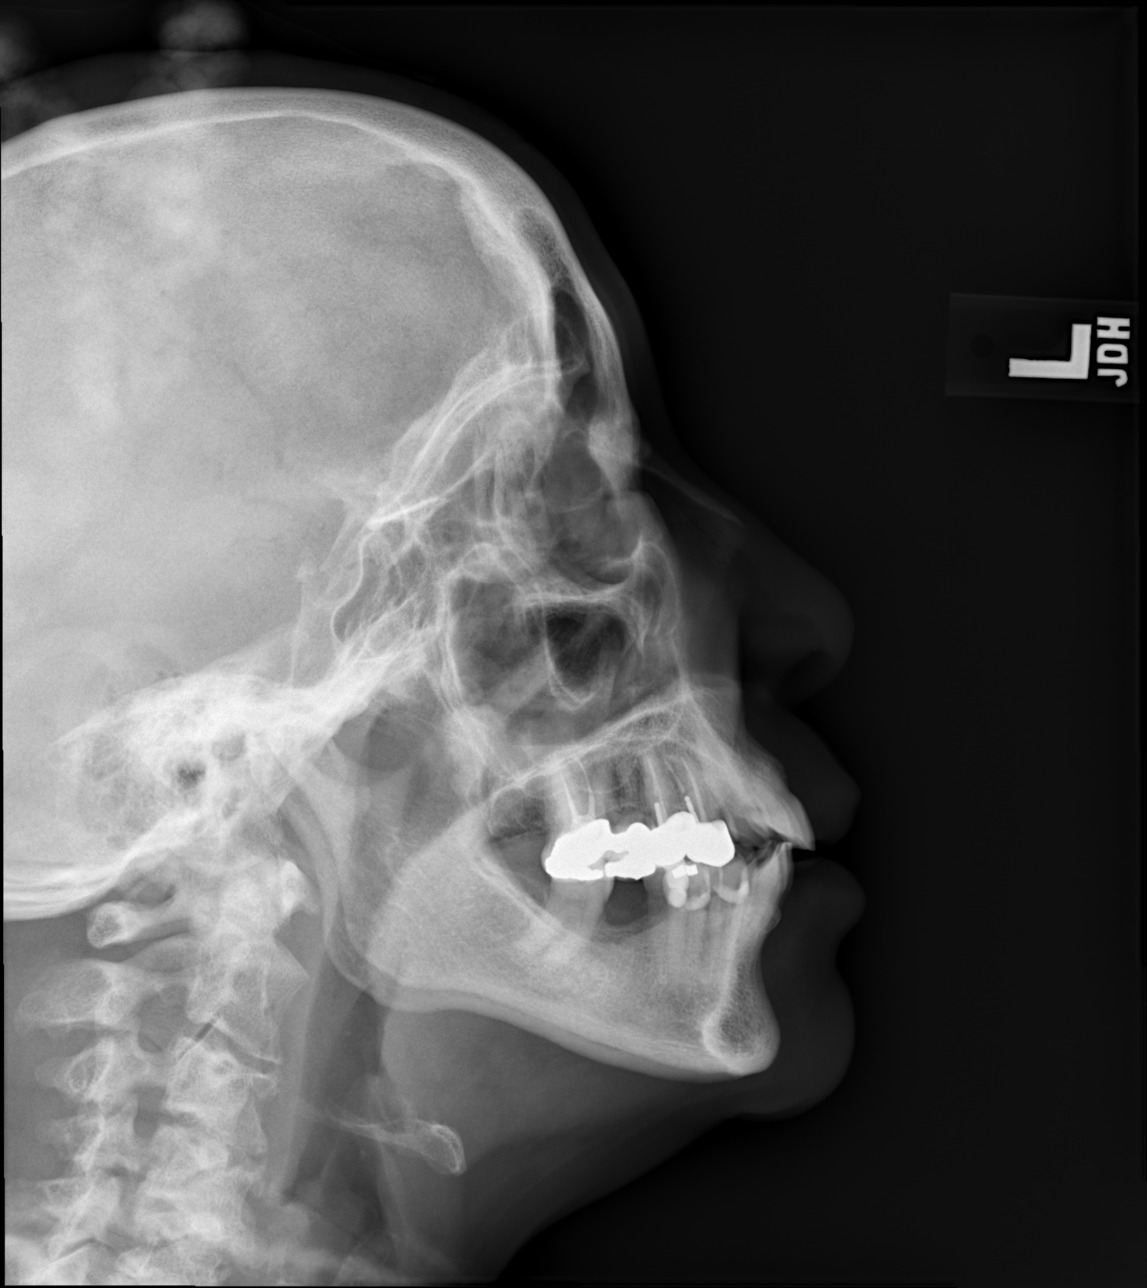

[w smv]
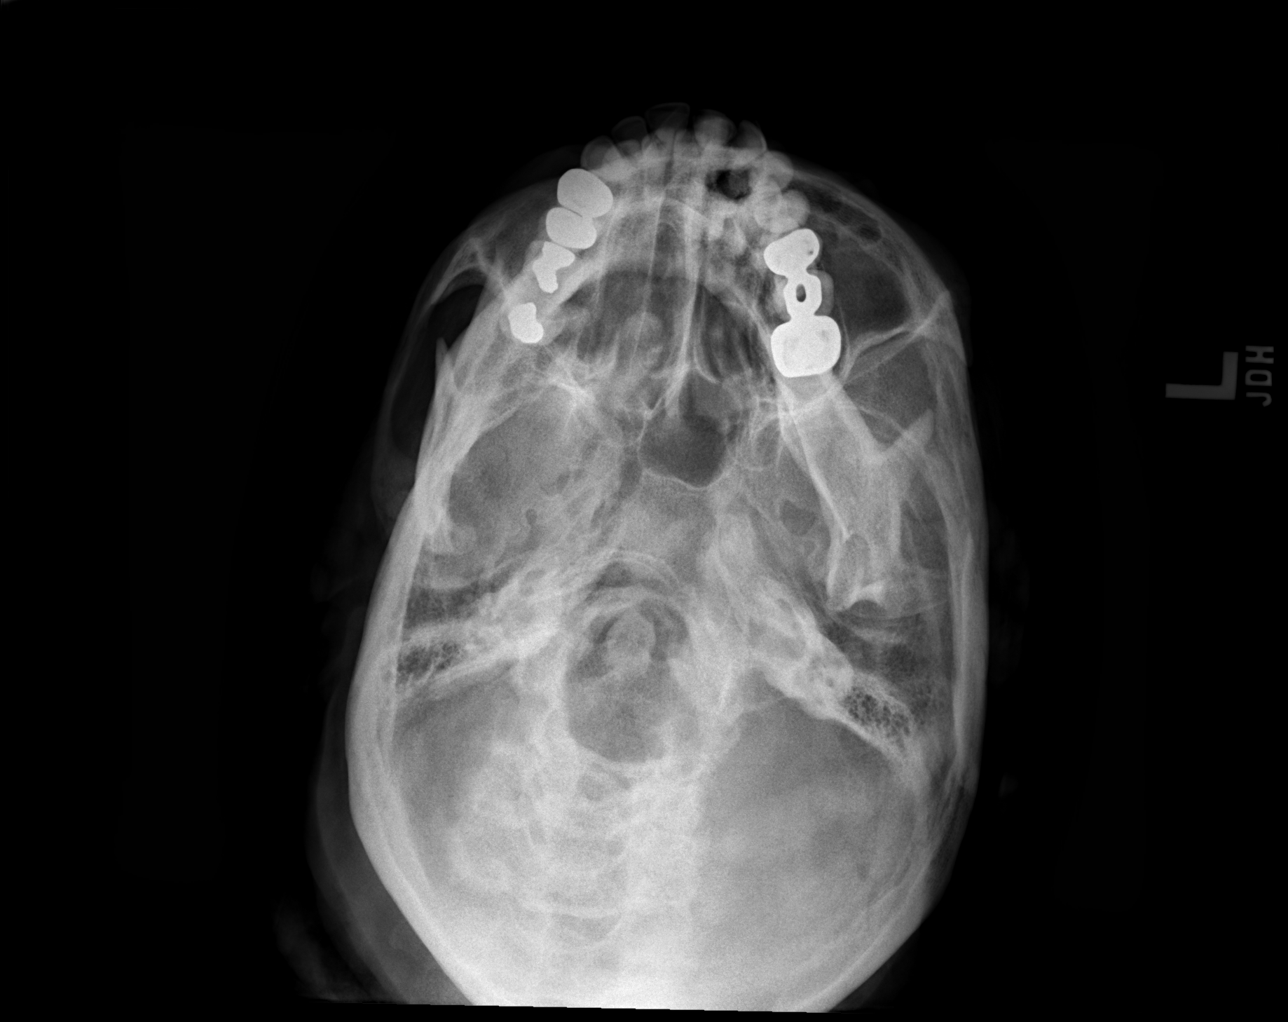

[4 of 4 positions shown; findings below may reference images not displayed]

FINDINGS: The frontal, ethmoid, maxillary and sphenoid sinuses are all well
aerated. No air-fluid levels or mucosal abnormalities are seen. No
acute bony abnormality is noted.
IMPRESSION: Normal appearing paranasal sinuses.

## 2019-05-30 DIAGNOSIS — R251 Tremor, unspecified: Secondary | ICD-10-CM | POA: Insufficient documentation

## 2019-06-01 ENCOUNTER — Ambulatory Visit (INDEPENDENT_AMBULATORY_CARE_PROVIDER_SITE_OTHER): Payer: Medicare Other | Admitting: Orthopaedic Surgery

## 2019-06-01 ENCOUNTER — Encounter: Payer: Self-pay | Admitting: Orthopaedic Surgery

## 2019-06-01 ENCOUNTER — Other Ambulatory Visit: Payer: Self-pay

## 2019-06-01 DIAGNOSIS — M25532 Pain in left wrist: Secondary | ICD-10-CM

## 2019-06-01 MED ORDER — METHYLPREDNISOLONE ACETATE 40 MG/ML IJ SUSP
20.0000 mg | INTRAMUSCULAR | Status: AC | PRN
  Administered 2019-06-01: 20 mg

## 2019-06-01 MED ORDER — LIDOCAINE HCL 1 % IJ SOLN
1.0000 mL | INTRAMUSCULAR | Status: AC | PRN
  Administered 2019-06-01: 1 mL

## 2019-06-01 NOTE — Progress Notes (Signed)
Office Visit Note   Patient: Courtney [name]           Date of Birth: 20-Mar-1955           MRN: [id number] Visit Date: 06/01/2019              Requested by: Jenel Lucks, [place],  [place] [address] PCP: Jenel Lucks, PA-C   Assessment & Plan: Visit Diagnoses:  1. Pain in left wrist     Plan: Localized area of tenderness about the ulnar collateral ligament left wrist.  No obvious skin changes.  Exam was relatively benign.  X-rays were performed throughan emergency facility [facility] and were interpreted as normal.  Will continue to wear the volar wrist splint and will monitor her response to the cortisone injection.  Has history of fibromyalgia which I think exacerbates her pain.  Might also have an issue with irritation of the ulnar nerve left elbow  Follow-Up Instructions: Return if symptoms worsen or fail to improve.   Orders:  Orders Placed This Encounter  Procedures  . Hand/UE Inj   No orders of the defined types were placed in this encounter.     Procedures: Hand/UE Inj on 06/01/2019 11:38 AM Details: 27 G needle, ulnar approach Medications: 1 mL lidocaine 1 %; 20 mg methylPREDNISolone acetate 40 MG/ML  I injected the area of tenderness along the ulnar collateral ligament left wrist .      Clinical Data: No additional findings.   Subjective: Chief Complaint  Patient presents with  . Left Wrist - Injury, Pain    DOI 05/20/2019  Patient states that almost two weeks ago she was standing in front of the kitchen sink with her arms by her side and something in her left wrist went out of joint. She said that she pushed it back in and was fine. Then she was reaching for bread and then same thing occurred again. She was able to push it back in and again felt better. She said that it occurred again, but she could not get relief. She went to [facility] ED on 05/25/2019 and had x-rays. She said that everything on x-ray was  normal. Her pain is located on the medial side of her wrist. It radiates down into her fourth and fifth fingers, and also up her entire arm. She takes oxycodone and tylenol as directed by pain management.  Has been wearing a wrist splint that she has at home  HPI  Review of Systems   Objective: Vital Signs: Ht 5\' 3"  (1.6 m)   Wt 160 lb (72.6 kg)   BMI 28.34 kg/m   Physical Exam Constitutional:      Appearance: She is well-developed.  Eyes:     Pupils: Pupils are equal, round, and reactive to light.  Pulmonary:     Effort: Pulmonary effort is normal.  Skin:    General: Skin is warm and dry.  Neurological:     Mental Status: She is alert and oriented to person, place, and time.  Psychiatric:        Behavior: Behavior normal.     Ortho Exam left wrist with some local tenderness in the area of the ulnar collateral ligament.  No skin changes.  No masses.  Has some altered sensibility in the ulnar 2 digits but appears to be minimal.  Good abduction and abduction.  No pain in the dorsum of the wrist i.e. ulnar or radius.  Pain with  range of motion of the wrist in the area of the ulnar collateral ligament.  Does have some tenderness over the ulnar nerve at the elbow and the medial epicondyle but full range of motion of the elbow. Specialty Comments:  No specialty comments available.  Imaging: No results found.   PMFS History: Patient Active Problem List   Diagnosis Date Noted  . Pain in left wrist 06/01/2019  . Fracture of distal phalanx of right ring finger 03/15/2019  . 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 Polyp of transverse colon   . Special screening for malignant neoplasms, colon   . History of colonic polyps   . Polyp of sigmoid colon   . Cervicalgia 08/10/2018  . 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 Loss of weight   . RLQ abdominal pain 03/27/2017  . Bloating 03/27/2017  . Abnormal finding on imaging 03/27/2017  . Dilation of  biliary tract 03/27/2017  . Nausea 03/27/2017  . Multiple joint pain 03/31/2016  . Essential tremor 03/31/2016  . Numbness in both hands 03/18/2016  . Hypersomnia 11/09/2015  . Diabetes mellitus ([place]) 07/18/2015  . Essential hypertension 07/18/2015  . Abnormal ECG 07/18/2015  . Gait difficulty 02/09/2015  . Chronic left shoulder pain 02/09/2015  . Depression with anxiety 02/09/2015  . Numbness 02/09/2015  . Insomnia 02/09/2015  . Urinary hesitancy 02/09/2015  . Other fatigue 02/09/2015   Past Medical History:  Diagnosis Date  . Abnormal ECG 07/18/2015  . Arthritis   . Asthma   . Bowel obstruction ([place])   . Depression with anxiety 02/09/2015   mild xanax prn  . Diabetes mellitus ([place]) 07/18/2015   type 2  . Diverticulitis   . Dysrhythmia    abnormal ekgs worked up  . Essential tremor 03/31/2016  . Fibromyalgia   . Gait difficulty 02/09/2015  . Gallstones   . GERD (gastroesophageal reflux disease)   . HLD (hyperlipidemia)   . Hypersomnia 11/09/2015  . Hypertension    no meds  . Hyperthyroidism    patient states has no problems at present-01/25/2019  . Hypothyroidism    patient states no problems at present 01/25/2019  . IBS (irritable bowel syndrome)   . Insomnia 02/09/2015  . Migraines   . MS (multiple sclerosis) ([place])   . Multiple joint pain 03/31/2016  . Numbness in both hands 03/18/2016  . Other fatigue 02/09/2015  . Peptic ulcer   . Pneumonia   . PONV (postoperative nausea and vomiting)   . Urinary hesitancy 02/09/2015  . Vision abnormalities     Family History  Problem Relation Age of Onset  . Heart disease Mother   . Kidney disease Mother   . Diabetes Mother   . Other Father 64       murdered  . Multiple sclerosis Daughter   . Multiple sclerosis Other   . Diabetes Sister   . Alcohol abuse Brother        ETOH and marijuana  . Diabetes Brother   . Diabetes Sister   . Diabetes Brother   . ALS Brother     Past Surgical History:  Procedure Laterality Date   . ABDOMINAL HYSTERECTOMY  1976   including oophorectomy, due to uterine cancer  . APPENDECTOMY  1974  . BIOPSY  01/31/2019   Procedure: BIOPSY;  Surgeon: Mauri Pole, MD;  [facility];  Service: Endoscopy;;  . CARPAL TUNNEL RELEASE Left    carpal tunnel surgery  . CHOLECYSTECTOMY  2006  . COLON SURGERY     SBO x 2  . COLONOSCOPY WITH PROPOFOL N/A 01/31/2019   Procedure: COLONOSCOPY WITH PROPOFOL;  Surgeon: Mauri Pole, MD;  [facility];  Service: Endoscopy;  Laterality: N/A;  . EUS N/A 04/09/2017   Procedure: UPPER ENDOSCOPIC ULTRASOUND (EUS) LINEAR;  Surgeon: Milus Banister, MD;  [facility];  Service: Endoscopy;  Laterality: N/A;  . HERNIA REPAIR    . MENISCUS REPAIR Left   . POLYPECTOMY  01/31/2019   Procedure: POLYPECTOMY;  Surgeon: Mauri Pole, MD;  [facility];  Service: Endoscopy;;  . SMALL INTESTINE SURGERY    . toenail removal Right 12/2017   great toenail    Social History   Occupational History  . Occupation: disabled  Tobacco Use  . Smoking status: Former Smoker    Packs/day: 0.25    Years: 5.00    Pack years: 1.25    Types: Cigarettes    Quit date: 1990    Years since quitting: 31.2  . Smokeless tobacco: Never Used  Substance and Sexual Activity  . Alcohol use: Yes    Comment: rarely  . Drug use: No  . Sexual activity: Yes

## 2019-06-02 ENCOUNTER — Ambulatory Visit: Payer: Medicare Other | Admitting: Orthopaedic Surgery

## 2019-06-06 DIAGNOSIS — G8929 Other chronic pain: Secondary | ICD-10-CM | POA: Diagnosis not present

## 2019-06-06 DIAGNOSIS — Z79899 Other long term (current) drug therapy: Secondary | ICD-10-CM | POA: Diagnosis not present

## 2019-06-06 DIAGNOSIS — F411 Generalized anxiety disorder: Secondary | ICD-10-CM | POA: Diagnosis not present

## 2019-06-06 DIAGNOSIS — G4700 Insomnia, unspecified: Secondary | ICD-10-CM | POA: Diagnosis not present

## 2019-06-07 DIAGNOSIS — M79641 Pain in right hand: Secondary | ICD-10-CM | POA: Diagnosis not present

## 2019-06-07 DIAGNOSIS — M79642 Pain in left hand: Secondary | ICD-10-CM | POA: Diagnosis not present

## 2019-06-07 DIAGNOSIS — S6982XA Other specified injuries of left wrist, hand and finger(s), initial encounter: Secondary | ICD-10-CM | POA: Diagnosis not present

## 2019-06-09 ENCOUNTER — Other Ambulatory Visit: Payer: Self-pay | Admitting: Orthopedic Surgery

## 2019-06-09 DIAGNOSIS — S6982XA Other specified injuries of left wrist, hand and finger(s), initial encounter: Secondary | ICD-10-CM

## 2019-06-17 DIAGNOSIS — M25519 Pain in unspecified shoulder: Secondary | ICD-10-CM | POA: Diagnosis not present

## 2019-06-17 DIAGNOSIS — M79606 Pain in leg, unspecified: Secondary | ICD-10-CM | POA: Diagnosis not present

## 2019-06-17 DIAGNOSIS — G894 Chronic pain syndrome: Secondary | ICD-10-CM | POA: Diagnosis not present

## 2019-06-17 DIAGNOSIS — M797 Fibromyalgia: Secondary | ICD-10-CM | POA: Diagnosis not present

## 2019-06-17 DIAGNOSIS — M542 Cervicalgia: Secondary | ICD-10-CM | POA: Diagnosis not present

## 2019-06-17 DIAGNOSIS — M79672 Pain in left foot: Secondary | ICD-10-CM | POA: Diagnosis not present

## 2019-06-17 DIAGNOSIS — M545 Low back pain: Secondary | ICD-10-CM | POA: Diagnosis not present

## 2019-06-17 DIAGNOSIS — E1140 Type 2 diabetes mellitus with diabetic neuropathy, unspecified: Secondary | ICD-10-CM | POA: Diagnosis not present

## 2019-06-17 DIAGNOSIS — Z79891 Long term (current) use of opiate analgesic: Secondary | ICD-10-CM | POA: Diagnosis not present

## 2019-06-20 DIAGNOSIS — E119 Type 2 diabetes mellitus without complications: Secondary | ICD-10-CM | POA: Diagnosis not present

## 2019-06-20 DIAGNOSIS — G250 Essential tremor: Secondary | ICD-10-CM | POA: Diagnosis not present

## 2019-06-20 DIAGNOSIS — G894 Chronic pain syndrome: Secondary | ICD-10-CM | POA: Diagnosis not present

## 2019-06-20 DIAGNOSIS — T402X5A Adverse effect of other opioids, initial encounter: Secondary | ICD-10-CM | POA: Diagnosis not present

## 2019-06-20 DIAGNOSIS — K648 Other hemorrhoids: Secondary | ICD-10-CM | POA: Diagnosis not present

## 2019-06-20 DIAGNOSIS — E559 Vitamin D deficiency, unspecified: Secondary | ICD-10-CM | POA: Diagnosis not present

## 2019-06-20 DIAGNOSIS — K5903 Drug induced constipation: Secondary | ICD-10-CM | POA: Diagnosis not present

## 2019-06-20 DIAGNOSIS — Z794 Long term (current) use of insulin: Secondary | ICD-10-CM | POA: Diagnosis not present

## 2019-06-20 DIAGNOSIS — N898 Other specified noninflammatory disorders of vagina: Secondary | ICD-10-CM | POA: Diagnosis not present

## 2019-06-28 ENCOUNTER — Ambulatory Visit
Admission: RE | Admit: 2019-06-28 | Discharge: 2019-06-28 | Disposition: A | Discharge status: Home / Self Care | Payer: Medicare Other | Source: Ambulatory Visit | Attending: Orthopedic Surgery | Admitting: Orthopedic Surgery

## 2019-06-28 ENCOUNTER — Other Ambulatory Visit: Payer: Self-pay

## 2019-06-28 DIAGNOSIS — S6982XA Other specified injuries of left wrist, hand and finger(s), initial encounter: Secondary | ICD-10-CM

## 2019-06-28 DIAGNOSIS — G8929 Other chronic pain: Secondary | ICD-10-CM | POA: Diagnosis not present

## 2019-06-28 DIAGNOSIS — M25532 Pain in left wrist: Secondary | ICD-10-CM | POA: Diagnosis not present

## 2019-06-28 MED ORDER — IOPAMIDOL (ISOVUE-M 200) INJECTION 41%
2.0000 mL | Freq: Once | INTRAMUSCULAR | Status: AC
  Administered 2019-06-28: 2 mL via INTRA_ARTICULAR

## 2019-06-29 DIAGNOSIS — S6982XA Other specified injuries of left wrist, hand and finger(s), initial encounter: Secondary | ICD-10-CM | POA: Diagnosis not present

## 2019-06-30 ENCOUNTER — Other Ambulatory Visit: Payer: Self-pay | Admitting: Orthopedic Surgery

## 2019-07-01 ENCOUNTER — Telehealth: Payer: Self-pay | Admitting: *Deleted

## 2019-07-01 NOTE — Telephone Encounter (Signed)
   [facility] Pre-operative Risk Assessment    Request for surgical clearance:  1. What type of surgery is being performed? LEFT WRIST ARTHROSCOPY WITH DEBRIDEMENT POSSIBLE TFCC REPAIR   2. When is this surgery scheduled? 07/28/19   3. What type of clearance is required (medical clearance vs. Pharmacy clearance to hold med vs. Both)? MEDICAL  4. Are there any medications that need to be held prior to surgery and how long? NONE   5. Practice name and name of physician performing surgery? [facility]; DR. Lennette Bihari KUZMA   6. What is your office phone number [phone]    7.   What is your office fax number [phone] ATTN; Odessa Fleming, RN  8.   Anesthesia type (None, local, MAC, general) ? CHOICE   Julaine Hua 07/01/2019, 3:33 PM  _________________________________________________________________   (provider comments below)

## 2019-07-01 NOTE — Telephone Encounter (Signed)
   Primary Cardiologist: Larae Grooms, MD  Chart reviewed as part of pre-operative protocol coverage. Patient was contacted 07/01/2019 in reference to pre-operative risk assessment for pending surgery as outlined below.  Courtney [name] was last seen on 12/21/2018 by Dr. Irish Lack.  Since that day, Courtney [name] has done fine from a cardiac standpoint. She can complete 4 METs without anginal complaints.   Therefore, based on ACC/AHA guidelines, the patient would be at acceptable risk for the planned procedure without further cardiovascular testing.   I will route this recommendation to the requesting party via Epic fax function and remove from pre-op pool.  Please call with questions.  Abigail Butts, PA-C 07/01/2019, 4:45 PM

## 2019-07-07 DIAGNOSIS — B373 Candidiasis of vulva and vagina: Secondary | ICD-10-CM | POA: Diagnosis not present

## 2019-07-07 DIAGNOSIS — N950 Postmenopausal bleeding: Secondary | ICD-10-CM | POA: Diagnosis not present

## 2019-07-07 DIAGNOSIS — K5900 Constipation, unspecified: Secondary | ICD-10-CM | POA: Diagnosis not present

## 2019-07-07 DIAGNOSIS — N951 Menopausal and female climacteric states: Secondary | ICD-10-CM | POA: Diagnosis not present

## 2019-07-07 DIAGNOSIS — L538 Other specified erythematous conditions: Secondary | ICD-10-CM | POA: Diagnosis not present

## 2019-07-07 DIAGNOSIS — Z90710 Acquired absence of both cervix and uterus: Secondary | ICD-10-CM | POA: Diagnosis not present

## 2019-07-07 DIAGNOSIS — Z1272 Encounter for screening for malignant neoplasm of vagina: Secondary | ICD-10-CM | POA: Diagnosis not present

## 2019-07-07 DIAGNOSIS — N939 Abnormal uterine and vaginal bleeding, unspecified: Secondary | ICD-10-CM | POA: Diagnosis not present

## 2019-07-07 DIAGNOSIS — Z86010 Personal history of colonic polyps: Secondary | ICD-10-CM | POA: Diagnosis not present

## 2019-07-07 DIAGNOSIS — K648 Other hemorrhoids: Secondary | ICD-10-CM | POA: Diagnosis not present

## 2019-07-15 DIAGNOSIS — M797 Fibromyalgia: Secondary | ICD-10-CM | POA: Diagnosis not present

## 2019-07-15 DIAGNOSIS — Z79891 Long term (current) use of opiate analgesic: Secondary | ICD-10-CM | POA: Diagnosis not present

## 2019-07-15 DIAGNOSIS — M545 Low back pain: Secondary | ICD-10-CM | POA: Diagnosis not present

## 2019-07-15 DIAGNOSIS — G894 Chronic pain syndrome: Secondary | ICD-10-CM | POA: Diagnosis not present

## 2019-07-15 DIAGNOSIS — M79672 Pain in left foot: Secondary | ICD-10-CM | POA: Diagnosis not present

## 2019-07-15 DIAGNOSIS — M79606 Pain in leg, unspecified: Secondary | ICD-10-CM | POA: Diagnosis not present

## 2019-07-15 DIAGNOSIS — E1140 Type 2 diabetes mellitus with diabetic neuropathy, unspecified: Secondary | ICD-10-CM | POA: Diagnosis not present

## 2019-07-15 DIAGNOSIS — M25519 Pain in unspecified shoulder: Secondary | ICD-10-CM | POA: Diagnosis not present

## 2019-07-15 DIAGNOSIS — M542 Cervicalgia: Secondary | ICD-10-CM | POA: Diagnosis not present

## 2019-07-18 DIAGNOSIS — F411 Generalized anxiety disorder: Secondary | ICD-10-CM | POA: Diagnosis not present

## 2019-07-18 DIAGNOSIS — Z79899 Other long term (current) drug therapy: Secondary | ICD-10-CM | POA: Diagnosis not present

## 2019-07-18 DIAGNOSIS — G8929 Other chronic pain: Secondary | ICD-10-CM | POA: Diagnosis not present

## 2019-07-18 DIAGNOSIS — G4700 Insomnia, unspecified: Secondary | ICD-10-CM | POA: Diagnosis not present

## 2019-08-09 ENCOUNTER — Telehealth: Payer: Self-pay | Admitting: Physician Assistant

## 2019-08-09 NOTE — Telephone Encounter (Signed)
Patient has discomfort in the middle of her abdomen above the umbilicus. She had been out of town and had a few drinks that contained alcohol. That was 2 days ago. She has a burning discomfort. Bowel movements are normal. Denies vomiting or changes in the stool color. She had Carafate from when she lived in [place]. She took a dose of this and it has helped. She is able to eat and do her normal activites. She states the bottle of Carafate has expired and is pretty much empty. She asks if she can have a prescription ffor this. Confirmed she is taking her Protonix daily. She was last seen 04/26/2018 by Nicoletta Ba, PA. Her screening colonoscopy was cancelled due to the pandemic.

## 2019-08-10 ENCOUNTER — Other Ambulatory Visit: Payer: Self-pay

## 2019-08-10 DIAGNOSIS — K5732 Diverticulitis of large intestine without perforation or abscess without bleeding: Secondary | ICD-10-CM

## 2019-08-10 MED ORDER — SUCRALFATE 1 G PO TABS: 1.0000 g | ORAL_TABLET | Freq: Three times a day (TID) | ORAL | 1 refills | Status: DC

## 2019-08-10 NOTE — Telephone Encounter (Signed)
Pt called again, she is requesting a call back today if at all possible.

## 2019-08-10 NOTE — Telephone Encounter (Signed)
Yes, it fine to refill Carafate 1gm before meals and at bedtime PRN X120 with 1 refill, tablets have longer shelf life, she can let them dissolve in 2-4oz water and drink as slurry. Colonoscopy was rescheduled and completed in Nov 2020 Thanks

## 2019-08-10 NOTE — Telephone Encounter (Signed)
RX sent in to patient's pharmacy, pt aware.

## 2019-08-19 DIAGNOSIS — M25562 Pain in left knee: Secondary | ICD-10-CM | POA: Diagnosis not present

## 2019-08-19 DIAGNOSIS — M25512 Pain in left shoulder: Secondary | ICD-10-CM | POA: Diagnosis not present

## 2019-08-19 DIAGNOSIS — M545 Low back pain: Secondary | ICD-10-CM | POA: Diagnosis not present

## 2019-08-19 DIAGNOSIS — M542 Cervicalgia: Secondary | ICD-10-CM | POA: Diagnosis not present

## 2019-08-19 DIAGNOSIS — M25561 Pain in right knee: Secondary | ICD-10-CM | POA: Diagnosis not present

## 2019-08-19 DIAGNOSIS — E559 Vitamin D deficiency, unspecified: Secondary | ICD-10-CM | POA: Diagnosis not present

## 2019-08-19 DIAGNOSIS — M549 Dorsalgia, unspecified: Secondary | ICD-10-CM | POA: Diagnosis not present

## 2019-08-19 DIAGNOSIS — M25532 Pain in left wrist: Secondary | ICD-10-CM | POA: Diagnosis not present

## 2019-08-19 DIAGNOSIS — G8929 Other chronic pain: Secondary | ICD-10-CM | POA: Diagnosis not present

## 2019-08-19 DIAGNOSIS — M129 Arthropathy, unspecified: Secondary | ICD-10-CM | POA: Diagnosis not present

## 2019-08-19 DIAGNOSIS — Z79899 Other long term (current) drug therapy: Secondary | ICD-10-CM | POA: Diagnosis not present

## 2019-08-19 DIAGNOSIS — M25511 Pain in right shoulder: Secondary | ICD-10-CM | POA: Diagnosis not present

## 2019-08-22 ENCOUNTER — Other Ambulatory Visit (HOSPITAL_COMMUNITY): Payer: TRICARE For Life (TFL)

## 2019-08-25 ENCOUNTER — Encounter (HOSPITAL_BASED_OUTPATIENT_CLINIC_OR_DEPARTMENT_OTHER): Admission: RE | Payer: Self-pay | Source: Home / Self Care

## 2019-08-25 ENCOUNTER — Ambulatory Visit (HOSPITAL_BASED_OUTPATIENT_CLINIC_OR_DEPARTMENT_OTHER): Admission: RE | Admit: 2019-08-25 | Payer: Medicare Other | Source: Home / Self Care | Admitting: Orthopedic Surgery

## 2019-08-25 SURGERY — WRIST ARTHROSCOPY WITH DEBRIDEMENT
Anesthesia: Choice | Site: Wrist | Laterality: Left

## 2019-08-29 DIAGNOSIS — Z79899 Other long term (current) drug therapy: Secondary | ICD-10-CM | POA: Diagnosis not present

## 2019-08-29 DIAGNOSIS — G8929 Other chronic pain: Secondary | ICD-10-CM | POA: Diagnosis not present

## 2019-08-29 DIAGNOSIS — F411 Generalized anxiety disorder: Secondary | ICD-10-CM | POA: Diagnosis not present

## 2019-08-30 DIAGNOSIS — L700 Acne vulgaris: Secondary | ICD-10-CM | POA: Diagnosis not present

## 2019-08-30 DIAGNOSIS — L720 Epidermal cyst: Secondary | ICD-10-CM | POA: Diagnosis not present

## 2019-09-02 DIAGNOSIS — Z780 Asymptomatic menopausal state: Secondary | ICD-10-CM | POA: Diagnosis not present

## 2019-09-02 DIAGNOSIS — M8589 Other specified disorders of bone density and structure, multiple sites: Secondary | ICD-10-CM | POA: Diagnosis not present

## 2019-09-02 DIAGNOSIS — Z1231 Encounter for screening mammogram for malignant neoplasm of breast: Secondary | ICD-10-CM | POA: Diagnosis not present

## 2019-09-05 ENCOUNTER — Telehealth: Payer: Self-pay | Admitting: Emergency Medicine

## 2019-09-05 ENCOUNTER — Ambulatory Visit
Admission: EM | Admit: 2019-09-05 | Discharge: 2019-09-05 | Disposition: A | Discharge status: Home / Self Care | Payer: Medicare Other

## 2019-09-05 ENCOUNTER — Encounter: Payer: Self-pay | Admitting: Emergency Medicine

## 2019-09-05 ENCOUNTER — Other Ambulatory Visit: Payer: Self-pay

## 2019-09-05 DIAGNOSIS — H9202 Otalgia, left ear: Secondary | ICD-10-CM

## 2019-09-05 MED ORDER — AZELASTINE HCL 0.1 % NA SOLN: 2.0000 | Freq: Two times a day (BID) | NASAL | 0 refills | Status: DC

## 2019-09-05 MED ORDER — AZELASTINE HCL 0.1 % NA SOLN
2.0000 | Freq: Two times a day (BID) | NASAL | 0 refills | Status: DC
Start: 2019-09-05 — End: 2019-09-05

## 2019-09-05 NOTE — ED Provider Notes (Signed)
[facility]    CSN: [id number] Arrival date & time: 09/05/19  6503      History   Chief Complaint Chief Complaint  Patient presents with  . Otalgia    HPI Courtney [name] is a 64 y.o. female.   64 year old female comes in for 2 day history of left ear pressure. Also with headache and throat irritation to the left side.  Denies ear drainage, muffled hearing.  States has left ear fullness with occasional itching sensation.  Has rhinorrhea/nasal congestion at baseline due to history of sinus surgery.  Denies fever.  OTC eardrops without relief.     Past Medical History:  Diagnosis Date  . Abnormal ECG 07/18/2015  . Arthritis   . Asthma   . Bowel obstruction ([place])   . Depression with anxiety 02/09/2015   mild xanax prn  . Diabetes mellitus ([place]) 07/18/2015   type 2  . Diverticulitis   . Dysrhythmia    abnormal ekgs worked up  . Essential tremor 03/31/2016  . Fibromyalgia   . Gait difficulty 02/09/2015  . Gallstones   . GERD (gastroesophageal reflux disease)   . HLD (hyperlipidemia)   . Hypersomnia 11/09/2015  . Hypertension    no meds  . Hyperthyroidism    patient states has no problems at present-01/25/2019  . Hypothyroidism    patient states no problems at present 01/25/2019  . IBS (irritable bowel syndrome)   . Insomnia 02/09/2015  . Migraines   . MS (multiple sclerosis) ([place])   . Multiple joint pain 03/31/2016  . Numbness in both hands 03/18/2016  . Other fatigue 02/09/2015  . Peptic ulcer   . Pneumonia   . PONV (postoperative nausea and vomiting)   . Urinary hesitancy 02/09/2015  . Vision abnormalities     Patient Active Problem List   Diagnosis Date Noted  . Pain in left wrist 06/01/2019  . Fracture of distal phalanx of right ring finger 03/15/2019  . 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 Polyp of transverse colon   . Special screening for malignant neoplasms, colon   . History of colonic polyps   . Polyp of sigmoid  colon   . Cervicalgia 08/10/2018  . 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 Loss of weight   . RLQ abdominal pain 03/27/2017  . Bloating 03/27/2017  . Abnormal finding on imaging 03/27/2017  . Dilation of biliary tract 03/27/2017  . Nausea 03/27/2017  . Multiple joint pain 03/31/2016  . Essential tremor 03/31/2016  . Numbness in both hands 03/18/2016  . Hypersomnia 11/09/2015  . Diabetes mellitus ([place]) 07/18/2015  . Essential hypertension 07/18/2015  . Abnormal ECG 07/18/2015  . Gait difficulty 02/09/2015  . Chronic left shoulder pain 02/09/2015  . Depression with anxiety 02/09/2015  . Numbness 02/09/2015  . Insomnia 02/09/2015  . Urinary hesitancy 02/09/2015  . Other fatigue 02/09/2015    Past Surgical History:  Procedure Laterality Date  . ABDOMINAL HYSTERECTOMY  1976   including oophorectomy, due to uterine cancer  . APPENDECTOMY  1974  . BIOPSY  01/31/2019   Procedure: BIOPSY;  Surgeon: Mauri Pole, MD;  Location: [facility];  Service: Endoscopy;;  . CARPAL TUNNEL RELEASE Left    carpal tunnel surgery  . CHOLECYSTECTOMY     2006  . COLON SURGERY     SBO x 2  . COLONOSCOPY WITH PROPOFOL N/A 01/31/2019   Procedure: COLONOSCOPY WITH PROPOFOL;  Surgeon: Mauri Pole, MD;  Location:  [facility];  Service: Endoscopy;  Laterality: N/A;  . EUS N/A 04/09/2017   Procedure: UPPER ENDOSCOPIC ULTRASOUND (EUS) LINEAR;  Surgeon: Milus Banister, MD;  Location: [facility];  Service: Endoscopy;  Laterality: N/A;  . HERNIA REPAIR    . MENISCUS REPAIR Left   . POLYPECTOMY  01/31/2019   Procedure: POLYPECTOMY;  Surgeon: Mauri Pole, MD;  Location: [facility];  Service: Endoscopy;;  . SMALL INTESTINE SURGERY    . toenail removal Right 12/2017   great toenail     OB History   No obstetric history on file.      Home Medications    Prior to Admission medications   Medication Sig Start Date End Date Taking? Authorizing Provider  sodium  chloride 0.9 % SOLN with buprenorphine 0.3 MG/ML SOLN 0.1 mg/mL Inject into the vein continuous.   Yes [provider]  acetaminophen (TYLENOL) 500 MG tablet Take 1,000 mg by mouth every 8 (eight) hours as needed for headache.    [provider]  albuterol (PROVENTIL HFA;VENTOLIN HFA) 108 (90 Base) MCG/ACT inhaler Inhale 2 puffs into the lungs every 6 (six) hours as needed for wheezing or shortness of breath. 12/19/15   Whiteheart, Cristal Ford, NP  alprazolam Duanne Moron) 2 MG tablet Take 2 mg by mouth 3 (three) times daily as needed for anxiety.     [provider]  amLODipine (NORVASC) 5 MG tablet Take 5 mg by mouth at bedtime. 01/14/19   [provider]  azelastine (ASTELIN) 0.1 % nasal spray Place 2 sprays into both nostrils 2 (two) times daily. 09/05/19   Tasia Catchings, Kane Kusek V, PA-C  butalbital-acetaminophen-caffeine (FIORICET, ESGIC) 50-325-40 MG tablet Take 1 tablet by mouth 2 (two) times daily as needed for headache.     [provider]  cephALEXin (KEFLEX) 500 MG capsule Take 1 capsule (500 mg total) by mouth 4 (four) times daily. 03/11/19   Aundra Dubin, PA-C  empagliflozin (JARDIANCE) 10 MG TABS tablet Take 10 mg by mouth daily.    [provider]  insulin aspart protamine- aspart (NOVOLOG MIX 70/30) (70-30) 100 UNIT/ML injection Inject 18-20 Units into the skin 2 (two) times daily. Inject into skin 18 unit with breakfast, and 20 units with supper daily.    [provider]  lidocaine (XYLOCAINE) 5 % ointment Apply 1 application topically daily as needed for mild pain.    [provider]  modafinil (PROVIGIL) 100 MG tablet Take 50 mg by mouth daily as needed (to stay awake).  11/22/18   [provider]  Multiple Vitamin (MULTIVITAMIN) tablet Take 2 tablets by mouth daily. Gummies    [provider]  mupirocin ointment (BACTROBAN) 2 % Apply 1 application topically 2 (two) times daily. 03/03/19   Tasia Catchings, Remon Quinto V, PA-C  naloxone  [facility]) nasal spray 4 mg/0.1 mL Place 1 spray into the nose daily as needed (overdose).    [provider]  oxyCODONE (OXYCONTIN) 10 mg 12 hr tablet Take 15 mg by mouth every 12 (twelve) hours.  10/16/17   [provider]  pantoprazole (PROTONIX) 40 MG tablet Take 40 mg by mouth daily as needed (acid reflux).     [provider]  sucralfate (CARAFATE) 1 g tablet Take 1 tablet (1 g total) by mouth 4 (four) times daily -  before meals and at bedtime. Dissolve 1 tablet in 2-4 oz of water, drink as slurry 08/10/19   Nandigam, Venia Minks, MD  triamcinolone (NASACORT) 55 MCG/ACT AERO nasal inhaler  Place 1 spray into the nose daily as needed (allergies).     [provider]    Family History Family History  Problem Relation Age of Onset  . Heart disease Mother   . Kidney disease Mother   . Diabetes Mother   . Other Father 44       murdered  . Multiple sclerosis Daughter   . Multiple sclerosis Other   . Diabetes Sister   . Alcohol abuse Brother        ETOH and marijuana  . Diabetes Brother   . Diabetes Sister   . Diabetes Brother   . ALS Brother     Social History Social History   Tobacco Use  . Smoking status: Former Smoker    Packs/day: 0.25    Years: 5.00    Pack years: 1.25    Types: Cigarettes    Quit date: 1990    Years since quitting: 31.5  . Smokeless tobacco: Never Used  Vaping Use  . Vaping Use: Never used  Substance Use Topics  . Alcohol use: Yes    Comment: rarely  . Drug use: No     Allergies   Ibuprofen, Prednisone, Solu-medrol [methylprednisolone], Bactrim [sulfamethoxazole-trimethoprim], Biaxin [clarithromycin], Naproxen, and Sulfa antibiotics   Review of Systems Review of Systems  Reason unable to perform ROS: See HPI as above.     Physical Exam Triage Vital Signs ED Triage Vitals [09/05/19 0938]  Enc Vitals Group     BP (!) 158/89     Pulse Rate 71     Resp 16     Temp 98.1 F (36.7 C)     Temp Source Oral      SpO2 96 %     Weight      Height      Head Circumference      Peak Flow      Pain Score 6     Pain Loc      Pain Edu?      Excl. in [place]?    No data found.  Updated Vital Signs BP (!) 158/89 (BP Location: Left Arm)   Pulse 71   Temp 98.1 F (36.7 C) (Oral)   Resp 16   SpO2 96%   Physical Exam Constitutional:      General: She is not in acute distress.    Appearance: Normal appearance. She is well-developed. She is not toxic-appearing or diaphoretic.  HENT:     Head: Normocephalic and atraumatic.     Right Ear: Tympanic membrane, ear canal and external ear normal. Tympanic membrane is not erythematous or bulging.     Left Ear: Tympanic membrane, ear canal and external ear normal. Tympanic membrane is not erythematous or bulging.     Ears:     Comments: No tragal tenderness bilaterally    Nose:     Right Sinus: No maxillary sinus tenderness or frontal sinus tenderness.     Left Sinus: Maxillary sinus tenderness present. No frontal sinus tenderness.     Mouth/Throat:     Mouth: Mucous membranes are moist.     Pharynx: Oropharynx is clear. Uvula midline.  Eyes:     Conjunctiva/sclera: Conjunctivae normal.     Pupils: Pupils are equal, round, and reactive to light.  Pulmonary:     Effort: Pulmonary effort is normal. No respiratory distress.     Comments: Speaking in full sentences without difficulty Musculoskeletal:     Cervical back: Normal range of motion and neck  supple.  Skin:    General: Skin is warm and dry.  Neurological:     Mental Status: She is alert and oriented to person, place, and time.      UC Treatments / Results  Labs (all labs ordered are listed, but only abnormal results are displayed) Labs Reviewed - No data to display  EKG   Radiology No results found.  Procedures Procedures (including critical care time)  Medications Ordered in UC Medications - No data to display  Initial Impression / Assessment and Plan / UC Course  I have  reviewed the triage vital signs and the nursing notes.  Pertinent labs & imaging results that were available during my care of the patient were reviewed by me and considered in my medical decision making (see chart for details).    No signs of otitis media, otitis externa. ?  Eustachian tube dysfunction causing symptoms.  Patient already on corticosteroid nasal spray, will add azelastine for symptomatic management.  Return precautions given.  Patient expresses understanding and agrees to plan.  Final Clinical Impressions(s) / UC Diagnoses   Final diagnoses:  Left ear pain   ED Prescriptions    Medication Sig Dispense Auth. Provider   azelastine (ASTELIN) 0.1 % nasal spray Place 2 sprays into both nostrils 2 (two) times daily. 30 mL Ok Edwards, PA-C     PDMP not reviewed this encounter.   Ok Edwards, PA-C 09/05/19 1023

## 2019-09-05 NOTE — Discharge Instructions (Signed)
No signs of infection. Can add azelastine as directed. Continue flonase, allergy medicine. Follow up with PCP if symptoms not improving.

## 2019-09-05 NOTE — ED Triage Notes (Signed)
Pt presents to [facility] for assessment of left ear pressure since yesterday with headache and scratchiness in her throat on the same side.

## 2019-09-20 DIAGNOSIS — J392 Other diseases of pharynx: Secondary | ICD-10-CM | POA: Diagnosis not present

## 2019-09-20 DIAGNOSIS — Z79899 Other long term (current) drug therapy: Secondary | ICD-10-CM | POA: Diagnosis not present

## 2019-09-20 DIAGNOSIS — G8929 Other chronic pain: Secondary | ICD-10-CM | POA: Diagnosis not present

## 2019-09-20 DIAGNOSIS — M545 Low back pain: Secondary | ICD-10-CM | POA: Diagnosis not present

## 2019-09-26 DIAGNOSIS — G8929 Other chronic pain: Secondary | ICD-10-CM | POA: Diagnosis not present

## 2019-09-26 DIAGNOSIS — F411 Generalized anxiety disorder: Secondary | ICD-10-CM | POA: Diagnosis not present

## 2019-09-26 DIAGNOSIS — Z79899 Other long term (current) drug therapy: Secondary | ICD-10-CM | POA: Diagnosis not present

## 2019-09-26 DIAGNOSIS — G4700 Insomnia, unspecified: Secondary | ICD-10-CM | POA: Diagnosis not present

## 2019-09-29 DIAGNOSIS — J392 Other diseases of pharynx: Secondary | ICD-10-CM | POA: Diagnosis not present

## 2019-10-08 DIAGNOSIS — R9082 White matter disease, unspecified: Secondary | ICD-10-CM | POA: Diagnosis not present

## 2019-10-08 DIAGNOSIS — J3489 Other specified disorders of nose and nasal sinuses: Secondary | ICD-10-CM | POA: Diagnosis not present

## 2019-10-08 DIAGNOSIS — R251 Tremor, unspecified: Secondary | ICD-10-CM | POA: Diagnosis not present

## 2019-10-08 DIAGNOSIS — R413 Other amnesia: Secondary | ICD-10-CM | POA: Diagnosis not present

## 2019-10-31 DIAGNOSIS — Z79899 Other long term (current) drug therapy: Secondary | ICD-10-CM | POA: Diagnosis not present

## 2019-10-31 DIAGNOSIS — M545 Low back pain: Secondary | ICD-10-CM | POA: Diagnosis not present

## 2019-10-31 DIAGNOSIS — G8929 Other chronic pain: Secondary | ICD-10-CM | POA: Diagnosis not present

## 2019-11-17 DIAGNOSIS — E119 Type 2 diabetes mellitus without complications: Secondary | ICD-10-CM | POA: Diagnosis not present

## 2019-11-17 DIAGNOSIS — G35 Multiple sclerosis: Secondary | ICD-10-CM | POA: Diagnosis not present

## 2019-11-17 DIAGNOSIS — H2513 Age-related nuclear cataract, bilateral: Secondary | ICD-10-CM | POA: Diagnosis not present

## 2019-11-17 DIAGNOSIS — H5010 Unspecified exotropia: Secondary | ICD-10-CM | POA: Diagnosis not present

## 2019-11-21 DIAGNOSIS — Z1159 Encounter for screening for other viral diseases: Secondary | ICD-10-CM | POA: Diagnosis not present

## 2019-11-21 DIAGNOSIS — Z79899 Other long term (current) drug therapy: Secondary | ICD-10-CM | POA: Diagnosis not present

## 2019-11-21 DIAGNOSIS — I10 Essential (primary) hypertension: Secondary | ICD-10-CM | POA: Diagnosis not present

## 2019-11-21 DIAGNOSIS — F411 Generalized anxiety disorder: Secondary | ICD-10-CM | POA: Diagnosis not present

## 2019-11-21 DIAGNOSIS — E119 Type 2 diabetes mellitus without complications: Secondary | ICD-10-CM | POA: Diagnosis not present

## 2019-11-21 DIAGNOSIS — Z794 Long term (current) use of insulin: Secondary | ICD-10-CM | POA: Diagnosis not present

## 2019-11-21 DIAGNOSIS — I2510 Atherosclerotic heart disease of native coronary artery without angina pectoris: Secondary | ICD-10-CM | POA: Diagnosis not present

## 2019-11-21 DIAGNOSIS — Z23 Encounter for immunization: Secondary | ICD-10-CM | POA: Diagnosis not present

## 2019-11-23 DIAGNOSIS — G8929 Other chronic pain: Secondary | ICD-10-CM | POA: Diagnosis not present

## 2019-11-23 DIAGNOSIS — Z79899 Other long term (current) drug therapy: Secondary | ICD-10-CM | POA: Diagnosis not present

## 2019-11-23 DIAGNOSIS — F411 Generalized anxiety disorder: Secondary | ICD-10-CM | POA: Diagnosis not present

## 2019-11-23 DIAGNOSIS — G4700 Insomnia, unspecified: Secondary | ICD-10-CM | POA: Diagnosis not present

## 2019-11-30 DIAGNOSIS — Z79899 Other long term (current) drug therapy: Secondary | ICD-10-CM | POA: Diagnosis not present

## 2019-11-30 DIAGNOSIS — M461 Sacroiliitis, not elsewhere classified: Secondary | ICD-10-CM | POA: Diagnosis not present

## 2019-11-30 DIAGNOSIS — M545 Low back pain: Secondary | ICD-10-CM | POA: Diagnosis not present

## 2019-11-30 DIAGNOSIS — G8929 Other chronic pain: Secondary | ICD-10-CM | POA: Diagnosis not present

## 2019-12-28 DIAGNOSIS — Z794 Long term (current) use of insulin: Secondary | ICD-10-CM | POA: Diagnosis not present

## 2019-12-28 DIAGNOSIS — Z8639 Personal history of other endocrine, nutritional and metabolic disease: Secondary | ICD-10-CM | POA: Diagnosis not present

## 2019-12-28 DIAGNOSIS — E119 Type 2 diabetes mellitus without complications: Secondary | ICD-10-CM | POA: Diagnosis not present

## 2019-12-28 DIAGNOSIS — R102 Pelvic and perineal pain: Secondary | ICD-10-CM | POA: Diagnosis not present

## 2019-12-28 DIAGNOSIS — N9489 Other specified conditions associated with female genital organs and menstrual cycle: Secondary | ICD-10-CM | POA: Diagnosis not present

## 2019-12-28 DIAGNOSIS — R634 Abnormal weight loss: Secondary | ICD-10-CM | POA: Diagnosis not present

## 2019-12-28 DIAGNOSIS — R630 Anorexia: Secondary | ICD-10-CM | POA: Diagnosis not present

## 2019-12-28 DIAGNOSIS — K219 Gastro-esophageal reflux disease without esophagitis: Secondary | ICD-10-CM | POA: Diagnosis not present

## 2019-12-30 DIAGNOSIS — K219 Gastro-esophageal reflux disease without esophagitis: Secondary | ICD-10-CM | POA: Diagnosis not present

## 2019-12-30 DIAGNOSIS — G8929 Other chronic pain: Secondary | ICD-10-CM | POA: Diagnosis not present

## 2019-12-30 DIAGNOSIS — M545 Low back pain, unspecified: Secondary | ICD-10-CM | POA: Diagnosis not present

## 2019-12-30 DIAGNOSIS — Z79899 Other long term (current) drug therapy: Secondary | ICD-10-CM | POA: Diagnosis not present

## 2020-01-13 ENCOUNTER — Other Ambulatory Visit: Payer: Self-pay | Admitting: Internal Medicine

## 2020-01-13 DIAGNOSIS — R102 Pelvic and perineal pain: Secondary | ICD-10-CM

## 2020-01-13 DIAGNOSIS — N9489 Other specified conditions associated with female genital organs and menstrual cycle: Secondary | ICD-10-CM

## 2020-01-23 ENCOUNTER — Other Ambulatory Visit: Payer: Self-pay | Admitting: Internal Medicine

## 2020-01-23 DIAGNOSIS — Z720 Tobacco use: Secondary | ICD-10-CM

## 2020-01-23 DIAGNOSIS — R634 Abnormal weight loss: Secondary | ICD-10-CM

## 2020-01-23 NOTE — Progress Notes (Signed)
Cardiology Office Note   Date:  01/24/2020   ID:  Courtney [name], DOB 02/26/1956, MRN [id number]  PCP:  Jenel Lucks, PA-C    No chief complaint on file.  Chest pain  Wt Readings from Last 3 Encounters:  01/24/20 154 lb 9.6 oz (70.1 kg)  06/01/19 160 lb (72.6 kg)  03/15/19 160 lb (72.6 kg)       History of Present Illness: Courtney [name] is a 64 y.o. female   who has had a h/o recurrent chest pain. SHe has been evaluated with cardiac cath in 2001 and 2006. SHe has had multiple stress tests most recently in 2016 which was normal.   She moved to [place] in 10/16 to find warmer weather. She has been diagnosed with MS and wants to be in a warmer climate.  She has occasionally had a skipped heart beat in the past.  In 2020, she has continued to have pain.  She has had a chronically sore throat and was seen by ENT.  She was started on BP meds but BP got quite low.  BP was fine after stopping them.  Since the last visit, she has done well.  Denies : Chest pain. Dizziness. Leg edema. Nitroglycerin use. Orthopnea. Paroxysmal nocturnal dyspnea.  Syncope.   Mild DOE with going up the stairs.  No problems on flat surfaces.    Rides stationary bike 30 minutes several times a week without difficulty.   She lost weight in the last year.  Past Medical History:  Diagnosis Date  . Abnormal ECG 07/18/2015  . Arthritis   . Asthma   . Bowel obstruction ([place])   . Depression with anxiety 02/09/2015   mild xanax prn  . Diabetes mellitus ([place]) 07/18/2015   type 2  . Diverticulitis   . Dysrhythmia    abnormal ekgs worked up  . Essential tremor 03/31/2016  . Fibromyalgia   . Gait difficulty 02/09/2015  . Gallstones   . GERD (gastroesophageal reflux disease)   . HLD (hyperlipidemia)   . Hypersomnia 11/09/2015  . Hypertension    no meds  . Hyperthyroidism    patient states has no problems at present-01/25/2019  . Hypothyroidism    patient states no  problems at present 01/25/2019  . IBS (irritable bowel syndrome)   . Insomnia 02/09/2015  . Migraines   . MS (multiple sclerosis) ([place])   . Multiple joint pain 03/31/2016  . Numbness in both hands 03/18/2016  . Other fatigue 02/09/2015  . Peptic ulcer   . Pneumonia   . PONV (postoperative nausea and vomiting)   . Urinary hesitancy 02/09/2015  . Vision abnormalities     Past Surgical History:  Procedure Laterality Date  . ABDOMINAL HYSTERECTOMY  1976   including oophorectomy, due to uterine cancer  . APPENDECTOMY  1974  . BIOPSY  01/31/2019   Procedure: BIOPSY;  Surgeon: Mauri Pole, MD;  Location: [facility];  Service: Endoscopy;;  . CARPAL TUNNEL RELEASE Left    carpal tunnel surgery  . CHOLECYSTECTOMY     2006  . COLON SURGERY     SBO x 2  . COLONOSCOPY WITH PROPOFOL N/A 01/31/2019   Procedure: COLONOSCOPY WITH PROPOFOL;  Surgeon: Mauri Pole, MD;  Location: [facility];  Service: Endoscopy;  Laterality: N/A;  . EUS N/A 04/09/2017   Procedure: UPPER ENDOSCOPIC ULTRASOUND (EUS) LINEAR;  Surgeon: Milus Banister, MD;  Location: [facility];  Service: Endoscopy;  Laterality: N/A;  .  HERNIA REPAIR    . MENISCUS REPAIR Left   . POLYPECTOMY  01/31/2019   Procedure: POLYPECTOMY;  Surgeon: Mauri Pole, MD;  [facility]: [facility];  Service: Endoscopy;;  . SMALL INTESTINE SURGERY    . toenail removal Right 12/2017   great toenail      Current Outpatient Medications  Medication Sig Dispense Refill  . acetaminophen (TYLENOL) 500 MG tablet Take 1,000 mg by mouth every 8 (eight) hours as needed for headache.    . alprazolam (XANAX) 2 MG tablet Take 2 mg by mouth 3 (three) times daily as needed for anxiety.     Marland Kitchen amLODipine (NORVASC) 5 MG tablet Take 5 mg by mouth at bedtime.    . Blood Glucose Monitoring Suppl (FIFTY50 GLUCOSE METER 2.0) w/Device KIT Use to check blood sugar two times a day DX:E11.9    . empagliflozin (JARDIANCE) 10 MG TABS tablet Take 10 mg  by mouth daily.    . Estradiol 10 MCG TABS vaginal tablet Insert 1 tablet vaginally each night for 2 weeks, then twice weekly.    Marland Kitchen glucose blood (FREESTYLE LITE) test strip USE 1 STRIP TWO TIMES A DAY, on insulin DX:E11.9    . insulin aspart protamine- aspart (NOVOLOG MIX 70/30) (70-30) 100 UNIT/ML injection Inject 18-20 Units into the skin 2 (two) times daily. Inject into skin 18 unit with breakfast, and 20 units with supper daily.    . Insulin Pen Needle (BD ULTRA-FINE PEN NEEDLES) 29G X 12.7MM MISC To be used with pen two to three times a day.    . Lancets (FREESTYLE) lancets 1 Device by Misc.(Non-Drug; Combo Route) route daily. USED TO CHECK BLOOD SUGAR TID    . lidocaine (XYLOCAINE) 5 % ointment Apply 1 application topically daily as needed for mild pain.    Marland Kitchen loratadine (CLARITIN) 10 MG tablet Take by mouth.    . Multiple Vitamin (MULTIVITAMIN) tablet Take 2 tablets by mouth daily. Gummies    . mupirocin ointment (BACTROBAN) 2 % Apply 1 application topically 2 (two) times daily. 22 g 0  . naloxone (NARCAN) nasal spray 4 mg/0.1 mL Place 1 spray into the nose daily as needed (overdose).    Marland Kitchen oxyCODONE (OXYCONTIN) 10 mg 12 hr tablet Take 15 mg by mouth every 12 (twelve) hours.   0  . pantoprazole (PROTONIX) 40 MG tablet Take 40 mg by mouth daily as needed (acid reflux).     . sucralfate (CARAFATE) 1 g tablet Take 1 tablet (1 g total) by mouth 4 (four) times daily -  before meals and at bedtime. Dissolve 1 tablet in 2-4 oz of water, drink as slurry 120 tablet 1  . triamcinolone (NASACORT) 55 MCG/ACT AERO nasal inhaler Place 1 spray into the nose daily as needed (allergies).      No current facility-administered medications for this visit.    Allergies:   Ibuprofen, Prednisone, Solu-medrol [methylprednisolone], Bactrim [sulfamethoxazole-trimethoprim], Biaxin [clarithromycin], Naproxen, and Sulfa antibiotics    Social History:  The patient  reports that she quit smoking about 31 years ago. Her  smoking use included cigarettes. She has a 1.25 pack-year smoking history. She has never used smokeless tobacco. She reports current alcohol use. She reports that she does not use drugs.   Family History:  [name] family history includes ALS in her brother; Alcohol abuse in her brother; Diabetes in her brother, brother, mother, sister, and sister; Heart disease in her mother; Kidney disease in her mother; Multiple sclerosis in her daughter and  another family member; Other (age of onset: 81) in her father.    ROS:  Please see the history of present illness.   Otherwise, review of systems are positive for left shoulder- rotator cuff torn, avoiding surgery.   All other systems are reviewed and negative.    PHYSICAL EXAM: VS:  BP 120/90   Pulse 78   Ht _0  (1.6 m)   Wt 154 lb 9.6 oz (70.1 kg)   SpO2 93%   BMI 27.39 kg/m  , BMI Body mass index is 27.39 kg/m. GEN: Well nourished, well developed, in no acute distress  HEENT: normal  Neck: no JVD, carotid bruits, or masses Cardiac: RRR; no murmurs, rubs, or gallops,no edema  Respiratory:  clear to auscultation bilaterally, normal work of breathing GI: soft, nontender, nondistended, + BS MS: no deformity or atrophy  Skin: warm and dry, no rash Neuro:  Strength and sensation are intact Psych: euthymic mood, full affect   EKG:   The ekg ordered today demonstrates NSR, LVH   Recent Labs: No results found for requested labs within last 8760 hours.   Lipid Panel No results found for: CHOL, TRIG, HDL, CHOLHDL, VLDL, LDLCALC, LDLDIRECT   Other studies Reviewed: Additional studies/ records that were reviewed today with results demonstrating: BMet , Cr 1.18 in 9/21.   ASSESSMENT AND PLAN:  1. Chest pain: No chest pain.  Mild DOE as noted above.  Chronic.  ECG unchanged.  Tolerating exercse well.  No ischemic testing at this time.  2. DM: Avoid processed foods.  Whole food plant based diet recommended.   A1C 7.1.   3. HTN: low  salt diet.  Trying to avoid meds.  Well controlled at home.   4. MS: Feels that she is staying cold.  Prefers warmer climate. 5. Check lipids.  Not done at [facility] this year.   Current medicines are reviewed at length with the patient today.  The patient concerns regarding her medicines were addressed.  The following changes have been made:  No change  Labs/ tests ordered today include: lipids when fasting No orders of the defined types were placed in this encounter.   Recommend 150 minutes/week of aerobic exercise Low fat, low carb, high fiber diet recommended  Disposition:   FU in 1 year or sooner if she has any symptoms with exercising or change in exercise tolerance   Signed, Larae Grooms, MD  01/24/2020 12:03 PM    [facility] [place], [place], [place]  [address] Phone: [phone]; Fax: [phone]

## 2020-01-24 ENCOUNTER — Other Ambulatory Visit: Payer: Self-pay

## 2020-01-24 ENCOUNTER — Encounter: Payer: Self-pay | Admitting: Interventional Cardiology

## 2020-01-24 ENCOUNTER — Ambulatory Visit (INDEPENDENT_AMBULATORY_CARE_PROVIDER_SITE_OTHER): Payer: Medicare Other | Admitting: Interventional Cardiology

## 2020-01-24 VITALS — BP 120/90 | HR 78 | Ht 63.0 in | Wt 154.6 lb

## 2020-01-24 DIAGNOSIS — I10 Essential (primary) hypertension: Secondary | ICD-10-CM | POA: Diagnosis not present

## 2020-01-24 DIAGNOSIS — E118 Type 2 diabetes mellitus with unspecified complications: Secondary | ICD-10-CM | POA: Diagnosis not present

## 2020-01-24 DIAGNOSIS — G35 Multiple sclerosis: Secondary | ICD-10-CM

## 2020-01-24 DIAGNOSIS — R072 Precordial pain: Secondary | ICD-10-CM

## 2020-01-24 NOTE — Patient Instructions (Signed)
Medication Instructions:  *If you need a refill on your cardiac medications before your next appointment, please call your pharmacy*  Lab Work: Your physician recommends that you return for lab work in: 1 week for fasting lipid panel.  If you have labs (blood work) drawn today and your tests are completely normal, you will receive your results only by: Marland Kitchen MyChart Message (if you have MyChart) OR . A paper copy in the mail If you have any lab test that is abnormal or we need to change your treatment, we will call you to review the results.  Follow-Up: At [facility], you and your health needs are our priority.  As part of our continuing mission to provide you with exceptional heart care, we have created designated Provider Care Teams.  These Care Teams include your primary Cardiologist (physician) and Advanced Practice Providers (APPs -  Physician Assistants and Nurse Practitioners) who all work together to provide you with the care you need, when you need it.  We recommend signing up for the patient portal called "MyChart".  Sign up information is provided on this After Visit Summary.  MyChart is used to connect with patients for Virtual Visits (Telemedicine).  Patients are able to view lab/test results, encounter notes, upcoming appointments, etc.  Non-urgent messages can be sent to your provider as well.   To learn more about what you can do with MyChart, go to [email or web address].    Your next appointment:   1 year(s)  The format for your next appointment:   In Person  Provider:   You may see Larae Grooms, MD or one of the following Advanced Practice Providers on your designated Care Team:    Melina Copa, PA-C  Ermalinda Barrios, PA-C    Other Instructions     High-Fiber Diet Fiber, also called dietary fiber, is a type of carbohydrate that is found in fruits, vegetables, whole grains, and beans. A high-fiber diet can have many health benefits. Your health care provider  may recommend a high-fiber diet to help:  Prevent constipation. Fiber can make your bowel movements more regular.  Lower your cholesterol.  Relieve the following conditions: ? Swelling of veins in the anus (hemorrhoids). ? Swelling and irritation (inflammation) of specific areas of the digestive tract (uncomplicated diverticulosis). ? A problem of the large intestine (colon) that sometimes causes pain and diarrhea (irritable bowel syndrome, IBS).  Prevent overeating as part of a weight-loss plan.  Prevent heart disease, type 2 diabetes, and certain cancers. What is my plan? The recommended daily fiber intake in grams (g) includes:  38 g for men age 23 or younger.  30 g for men over age 41.  92 g for women age 55 or younger.  21 g for women over age 66. You can get the recommended daily intake of dietary fiber by:  Eating a variety of fruits, vegetables, grains, and beans.  Taking a fiber supplement, if it is not possible to get enough fiber through your diet. What do I need to know about a high-fiber diet?  It is better to get fiber through food sources rather than from fiber supplements. There is not a lot of research about how effective supplements are.  Always check the fiber content on the nutrition facts label of any prepackaged food. Look for foods that contain 5 g of fiber or more per serving.  Talk with a diet and nutrition specialist (dietitian) if you have questions about specific foods that are recommended or not recommended  for your medical condition, especially if those foods are not listed below.  Gradually increase how much fiber you consume. If you increase your intake of dietary fiber too quickly, you may have bloating, cramping, or gas.  Drink plenty of water. Water helps you to digest fiber. What are tips for following this plan?  Eat a wide variety of high-fiber foods.  Make sure that half of the grains that you eat each day are whole grains.  Eat  breads and cereals that are made with whole-grain flour instead of refined flour or white flour.  Eat brown rice, bulgur wheat, or millet instead of white rice.  Start the day with a breakfast that is high in fiber, such as a cereal that contains 5 g of fiber or more per serving.  Use beans in place of meat in soups, salads, and pasta dishes.  Eat high-fiber snacks, such as berries, raw vegetables, nuts, and popcorn.  Choose whole fruits and vegetables instead of processed forms like juice or sauce. What foods can I eat?  Fruits Berries. Pears. Apples. Oranges. Avocado. Prunes and raisins. Dried figs. Vegetables Sweet potatoes. Spinach. Kale. Artichokes. Cabbage. Broccoli. Cauliflower. Green peas. Carrots. Squash. Grains Whole-grain breads. Multigrain cereal. Oats and oatmeal. Brown rice. Barley. Bulgur wheat. [place]. Quinoa. Bran muffins. Popcorn. Rye wafer crackers. Meats and other proteins Navy, kidney, and pinto beans. Soybeans. Split peas. Lentils. Nuts and seeds. Dairy Fiber-fortified yogurt. Beverages Fiber-fortified soy milk. Fiber-fortified orange juice. Other foods Fiber bars. The items listed above may not be a complete list of recommended foods and beverages. Contact a dietitian for more options. What foods are not recommended? Fruits Fruit juice. Cooked, strained fruit. Vegetables Fried potatoes. Canned vegetables. Well-cooked vegetables. Grains White bread. Pasta made with refined flour. White rice. Meats and other proteins Fatty cuts of meat. Fried chicken or fried fish. Dairy Milk. Yogurt. Cream cheese. Sour cream. Fats and oils Butters. Beverages Soft drinks. Other foods Cakes and pastries. The items listed above may not be a complete list of foods and beverages to avoid. Contact a dietitian for more information. Summary  Fiber is a type of carbohydrate. It is found in fruits, vegetables, whole grains, and beans.  There are many health benefits of  eating a high-fiber diet, such as preventing constipation, lowering blood cholesterol, helping with weight loss, and reducing your risk of heart disease, diabetes, and certain cancers.  Gradually increase your intake of fiber. Increasing too fast can result in cramping, bloating, and gas. Drink plenty of water while you increase your fiber.  The best sources of fiber include whole fruits and vegetables, whole grains, nuts, seeds, and beans. This information is not intended to replace advice given to you by your health care provider. Make sure you discuss any questions you have with your health care provider. Document Revised: 12/22/2016 Document Reviewed: 12/22/2016 Elsevier Patient Education  2020 [employer].

## 2020-01-25 ENCOUNTER — Ambulatory Visit
Admission: RE | Admit: 2020-01-25 | Discharge: 2020-01-25 | Disposition: A | Discharge status: Home / Self Care | Payer: Medicare Other | Source: Ambulatory Visit | Attending: Internal Medicine | Admitting: Internal Medicine

## 2020-01-25 DIAGNOSIS — Z90722 Acquired absence of ovaries, bilateral: Secondary | ICD-10-CM | POA: Diagnosis not present

## 2020-01-25 DIAGNOSIS — Z90710 Acquired absence of both cervix and uterus: Secondary | ICD-10-CM | POA: Diagnosis not present

## 2020-01-25 DIAGNOSIS — R102 Pelvic and perineal pain: Secondary | ICD-10-CM | POA: Diagnosis not present

## 2020-01-25 DIAGNOSIS — N9489 Other specified conditions associated with female genital organs and menstrual cycle: Secondary | ICD-10-CM

## 2020-01-25 DIAGNOSIS — Z720 Tobacco use: Secondary | ICD-10-CM

## 2020-01-25 DIAGNOSIS — R634 Abnormal weight loss: Secondary | ICD-10-CM | POA: Diagnosis not present

## 2020-01-30 ENCOUNTER — Other Ambulatory Visit: Payer: Medicare Other | Admitting: *Deleted

## 2020-01-30 ENCOUNTER — Other Ambulatory Visit: Payer: Self-pay

## 2020-01-30 DIAGNOSIS — E118 Type 2 diabetes mellitus with unspecified complications: Secondary | ICD-10-CM | POA: Diagnosis not present

## 2020-01-30 DIAGNOSIS — R072 Precordial pain: Secondary | ICD-10-CM

## 2020-01-30 DIAGNOSIS — G35 Multiple sclerosis: Secondary | ICD-10-CM

## 2020-01-30 DIAGNOSIS — I10 Essential (primary) hypertension: Secondary | ICD-10-CM

## 2020-01-30 DIAGNOSIS — Z79899 Other long term (current) drug therapy: Secondary | ICD-10-CM | POA: Diagnosis not present

## 2020-01-30 DIAGNOSIS — M2550 Pain in unspecified joint: Secondary | ICD-10-CM | POA: Diagnosis not present

## 2020-01-30 DIAGNOSIS — M7502 Adhesive capsulitis of left shoulder: Secondary | ICD-10-CM | POA: Diagnosis not present

## 2020-01-30 DIAGNOSIS — G8929 Other chronic pain: Secondary | ICD-10-CM | POA: Diagnosis not present

## 2020-01-30 DIAGNOSIS — M545 Low back pain, unspecified: Secondary | ICD-10-CM | POA: Diagnosis not present

## 2020-01-30 LAB — LIPID PANEL
Chol/HDL Ratio: 2.2 ratio (ref 0.0–4.4)
Cholesterol, Total: 177 mg/dL (ref 100–199)
HDL: 82 mg/dL (ref >39)
LDL Chol Calc (NIH): 72 mg/dL (ref 0–99)
Triglycerides: 138 mg/dL (ref 0–149)
VLDL Cholesterol Cal: 23 mg/dL (ref 5–40)

## 2020-02-29 ENCOUNTER — Ambulatory Visit
Admission: EM | Admit: 2020-02-29 | Discharge: 2020-02-29 | Disposition: A | Discharge status: Home / Self Care | Payer: Medicare Other | Attending: Emergency Medicine | Admitting: Emergency Medicine

## 2020-02-29 ENCOUNTER — Other Ambulatory Visit: Payer: Self-pay

## 2020-02-29 DIAGNOSIS — J069 Acute upper respiratory infection, unspecified: Secondary | ICD-10-CM | POA: Insufficient documentation

## 2020-02-29 DIAGNOSIS — Z79899 Other long term (current) drug therapy: Secondary | ICD-10-CM | POA: Diagnosis not present

## 2020-02-29 DIAGNOSIS — B349 Viral infection, unspecified: Secondary | ICD-10-CM | POA: Diagnosis not present

## 2020-02-29 DIAGNOSIS — G8929 Other chronic pain: Secondary | ICD-10-CM | POA: Diagnosis not present

## 2020-02-29 DIAGNOSIS — M545 Low back pain, unspecified: Secondary | ICD-10-CM | POA: Diagnosis not present

## 2020-02-29 LAB — POCT RAPID STREP A (OFFICE): Rapid Strep A Screen: NEGATIVE

## 2020-02-29 MED ORDER — BENZONATATE 200 MG PO CAPS
200.0000 mg | ORAL_CAPSULE | Freq: Three times a day (TID) | ORAL | 0 refills | Status: AC | PRN
Start: 2020-02-29 — End: 2020-03-07

## 2020-02-29 NOTE — Discharge Instructions (Addendum)
Covid test pending, strep test negative Continue daily Claritin Tessalon for cough Rest and fluids Honey and hot tea I would expect symptoms to start to improve by this weekend, follow-up if not improving or worsening

## 2020-02-29 NOTE — ED Triage Notes (Signed)
Pt presents to Urgent Care with c/o nasal congestion, cough, and sore throat x 3 days. Pt w/ no known COVID exposure; has been vaccinated.

## 2020-02-29 NOTE — ED Provider Notes (Signed)
[facility]    CSN: [id number] Arrival date & time: 02/29/20  0954      History   Chief Complaint Chief Complaint  Patient presents with   Nasal Congestion   Sore Throat    HPI Chaya Malvika Tung is a 64 y.o. female presenting today for evaluation of URI symptoms. Reports cough congestion and sore throat for the past 3 days. Denies known Covid exposure. Does report Covid vaccination.  Denies any fevers chills or body aches.  HPI  Past Medical History:  Diagnosis Date   Abnormal ECG 07/18/2015   Arthritis    Asthma    Bowel obstruction (HCC)    Depression with anxiety 02/09/2015   mild xanax prn   Diabetes mellitus ([place]) 07/18/2015   type 2   Diverticulitis    Dysrhythmia    abnormal ekgs worked up   Essential tremor 03/31/2016   Fibromyalgia    Gait difficulty 02/09/2015   Gallstones    GERD (gastroesophageal reflux disease)    HLD (hyperlipidemia)    Hypersomnia 11/09/2015   Hypertension    no meds   Hyperthyroidism    patient states has no problems at present-01/25/2019   Hypothyroidism    patient states no problems at present 01/25/2019   IBS (irritable bowel syndrome)    Insomnia 02/09/2015   Migraines    MS (multiple sclerosis) ([place])    Multiple joint pain 03/31/2016   Numbness in both hands 03/18/2016   Other fatigue 02/09/2015   Peptic ulcer    Pneumonia    PONV (postoperative nausea and vomiting)    Urinary hesitancy 02/09/2015   Vision abnormalities     Patient Active Problem List   Diagnosis Date Noted   Pain in left wrist 06/01/2019   Fracture of distal phalanx of right ring finger 03/15/2019   Facet arthritis, degenerative, 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   Polyp of transverse colon    Special screening for malignant neoplasms, colon    History of colonic polyps    Polyp of sigmoid colon    Cervicalgia 08/10/2018   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   Loss of weight    RLQ  abdominal pain 03/27/2017   Bloating 03/27/2017   Abnormal finding on imaging 03/27/2017   Dilation of biliary tract 03/27/2017   Nausea 03/27/2017   Multiple joint pain 03/31/2016   Essential tremor 03/31/2016   Numbness in both hands 03/18/2016   Hypersomnia 11/09/2015   Diabetes mellitus ([place]) 07/18/2015   Essential hypertension 07/18/2015   Abnormal ECG 07/18/2015   Gait difficulty 02/09/2015   Chronic left shoulder pain 02/09/2015   Depression with anxiety 02/09/2015   Numbness 02/09/2015   Insomnia 02/09/2015   Urinary hesitancy 02/09/2015   Other fatigue 02/09/2015    Past Surgical History:  Procedure Laterality Date   ABDOMINAL HYSTERECTOMY  1976   including oophorectomy, due to uterine cancer   APPENDECTOMY  1974   BIOPSY  01/31/2019   Procedure: BIOPSY;  Surgeon: Mauri Pole, MD;  Location: [facility];  Service: Endoscopy;;   CARPAL TUNNEL RELEASE Left    carpal tunnel surgery   CHOLECYSTECTOMY     2006   COLON SURGERY     SBO x 2   COLONOSCOPY WITH PROPOFOL N/A 01/31/2019   Procedure: COLONOSCOPY WITH PROPOFOL;  Surgeon: Mauri Pole, MD;  Location: [facility];  Service: Endoscopy;  Laterality: N/A;   EUS N/A 04/09/2017   Procedure: UPPER ENDOSCOPIC ULTRASOUND (EUS) LINEAR;  Surgeon: Ardis Hughs,  Melene Plan, MD;  Location: [facility];  Service: Endoscopy;  Laterality: N/A;   HERNIA REPAIR     MENISCUS REPAIR Left    POLYPECTOMY  01/31/2019   Procedure: POLYPECTOMY;  Surgeon: Mauri Pole, MD;  Location: [facility];  Service: Endoscopy;;   SMALL INTESTINE SURGERY     toenail removal Right 12/2017   great toenail     OB History   No obstetric history on file.      Home Medications    Prior to Admission medications   Medication Sig Start Date End Date Taking? Authorizing Provider  benzonatate (TESSALON) 200 MG capsule Take 1 capsule (200 mg total) by mouth 3 (three) times daily as needed for up to 7  days for cough. 02/29/20 03/07/20 Yes Malan Werk C, PA-C  triamcinolone (NASACORT) 55 MCG/ACT AERO nasal inhaler Place 1 spray into the nose daily as needed (allergies).    Yes [provider]  acetaminophen (TYLENOL) 500 MG tablet Take 1,000 mg by mouth every 8 (eight) hours as needed for headache.    [provider]  alprazolam Duanne Moron) 2 MG tablet Take 2 mg by mouth 3 (three) times daily as needed for anxiety.     [provider]  amLODipine (NORVASC) 5 MG tablet Take 5 mg by mouth at bedtime. 01/14/19   [provider]  Blood Glucose Monitoring Suppl (FIFTY50 GLUCOSE METER 2.0) w/Device KIT Use to check blood sugar two times a day DX:E11.9 10/05/19   [provider]  empagliflozin (JARDIANCE) 10 MG TABS tablet Take 10 mg by mouth daily.    [provider]  glucose blood (FREESTYLE LITE) test strip USE 1 STRIP TWO TIMES A DAY, on insulin DX:E11.9 09/28/19   [provider]  insulin aspart protamine- aspart (NOVOLOG MIX 70/30) (70-30) 100 UNIT/ML injection Inject 18-20 Units into the skin 2 (two) times daily. Inject into skin 18 unit with breakfast, and 20 units with supper daily.    [provider]  Insulin Pen Needle (BD ULTRA-FINE PEN NEEDLES) 29G X 12.7MM MISC To be used with pen two to three times a day. 01/05/20   [provider]  Lancets (FREESTYLE) lancets 1 Device by Misc.(Non-Drug; Combo Route) route daily. USED TO CHECK BLOOD SUGAR TID 01/05/20   [provider]  lidocaine (XYLOCAINE) 5 % ointment Apply 1 application topically daily as needed for mild pain.    [provider]  loratadine (CLARITIN) 10 MG tablet Take by mouth. 01/05/20   [provider]  Multiple Vitamin (MULTIVITAMIN) tablet Take 2 tablets by mouth daily. Gummies    [provider]  mupirocin ointment (BACTROBAN) 2 % Apply 1 application topically 2 (two) times daily. 03/03/19   Tasia Catchings, Amy V, PA-C  naloxone  [facility]) nasal spray 4 mg/0.1 mL Place 1 spray into the nose daily as needed (overdose).    [provider]  oxyCODONE (OXYCONTIN) 10 mg 12 hr tablet Take 15 mg by mouth every 12 (twelve) hours.  10/16/17   [provider]  pantoprazole (PROTONIX) 40 MG tablet Take 40 mg by mouth daily as needed (acid reflux).     [provider]  sucralfate (CARAFATE) 1 g tablet Take 1 tablet (1 g total) by mouth 4 (four) times daily -  before meals and at bedtime. Dissolve 1 tablet in 2-4 oz of water, drink as slurry 08/10/19 02/29/20  Mauri Pole, MD    Family History Family History  Problem Relation Age of Onset  Heart disease Mother    Kidney disease Mother    Diabetes Mother    Other Father 44       murdered   Multiple sclerosis Daughter    Multiple sclerosis Other    Diabetes Sister    Alcohol abuse Brother        ETOH and marijuana   Diabetes Brother    Diabetes Sister    Diabetes Brother    ALS Brother     Social History Social History   Tobacco Use   Smoking status: Former Smoker    Packs/day: 0.25    Years: 5.00    Pack years: 1.25    Types: Cigarettes    Quit date: 1990    Years since quitting: 32.0   Smokeless tobacco: Never Used  Scientific laboratory technician Use: Never used  Substance Use Topics   Alcohol use: Yes    Comment: rarely   Drug use: No     Allergies   Ibuprofen, Prednisone, Solu-medrol [methylprednisolone], Bactrim [sulfamethoxazole-trimethoprim], Biaxin [clarithromycin], Naproxen, and Sulfa antibiotics   Review of Systems Review of Systems  Constitutional: Negative for activity change, appetite change, chills, fatigue and fever.  HENT: Positive for congestion, rhinorrhea, sinus pressure and sore throat. Negative for ear pain and trouble swallowing.   Eyes: Negative for discharge and redness.  Respiratory: Positive for cough. Negative for chest tightness and shortness of breath.   Cardiovascular: Negative for  chest pain.  Gastrointestinal: Negative for abdominal pain, diarrhea, nausea and vomiting.  Musculoskeletal: Negative for myalgias.  Skin: Negative for rash.  Neurological: Negative for dizziness, light-headedness and headaches.     Physical Exam Triage Vital Signs ED Triage Vitals [02/29/20 1230]  Enc Vitals Group     BP 117/80     Pulse Rate 89     Resp 20     Temp 98.6 F (37 C)     Temp Source Oral     SpO2 96 %     Weight      Height      Head Circumference      Peak Flow      Pain Score      Pain Loc      Pain Edu?      Excl. in [place]?    No data found.  Updated Vital Signs BP 117/80 (BP Location: Left Arm)    Pulse 89    Temp 98.6 F (37 C) (Oral)    Resp 20    Ht _0  (1.6 m)    Wt 151 lb (68.5 kg)    SpO2 96%    BMI 26.75 kg/m   Visual Acuity Right Eye Distance:   Left Eye Distance:   Bilateral Distance:    Right Eye Near:   Left Eye Near:    Bilateral Near:     Physical Exam Vitals and nursing note reviewed.  Constitutional:      Appearance: She is well-developed and well-nourished.     Comments: No acute distress  HENT:     Head: Normocephalic and atraumatic.     Ears:     Comments: Bilateral ears without tenderness to palpation of external auricle, tragus and mastoid, EAC's without erythema or swelling, TM's with good bony landmarks and cone of light. Non erythematous.     Nose: Nose normal.     Mouth/Throat:     Comments: Oral mucosa pink and moist, right tonsillar area with small area of exudate and erythema, overall tonsils  not enlarged or swollen, appear pink. Posterior pharynx patent and nonerythematous, no uvula deviation or swelling. Normal phonation. Eyes:     Conjunctiva/sclera: Conjunctivae normal.  Cardiovascular:     Rate and Rhythm: Normal rate.  Pulmonary:     Effort: Pulmonary effort is normal. No respiratory distress.     Comments: Breathing comfortably at rest, CTABL, no wheezing, rales or other adventitious sounds  auscultated Abdominal:     General: There is no distension.  Musculoskeletal:        General: Normal range of motion.     Cervical back: Neck supple.  Skin:    General: Skin is warm and dry.  Neurological:     Mental Status: She is alert and oriented to person, place, and time.  Psychiatric:        Mood and Affect: Mood and affect normal.      UC Treatments / Results  Labs (all labs ordered are listed, but only abnormal results are displayed) Labs Reviewed  CULTURE, GROUP A STREP ([place])  NOVEL CORONAVIRUS, NAA  POCT RAPID STREP A (OFFICE)    EKG   Radiology No results found.  Procedures Procedures (including critical care time)  Medications Ordered in UC Medications - No data to display  Initial Impression / Assessment and Plan / UC Course  I have reviewed the triage vital signs and the nursing notes.  Pertinent labs & imaging results that were available during my care of the patient were reviewed by me and considered in my medical decision making (see chart for details).     Strep test negative, Covid test pending.  Suspect viral etiology, exam reassuring, recommending symptomatic and supportive care rest and fluids.  Discussed strict return precautions. Patient verbalized understanding and is agreeable with plan.  Final Clinical Impressions(s) / UC Diagnoses   Final diagnoses:  Viral URI with cough     Discharge Instructions     Covid test pending, strep test negative Continue daily Claritin Tessalon for cough Rest and fluids Honey and hot tea I would expect symptoms to start to improve by this weekend, follow-up if not improving or worsening    ED Prescriptions    Medication Sig Dispense Auth. Provider   benzonatate (TESSALON) 200 MG capsule Take 1 capsule (200 mg total) by mouth 3 (three) times daily as needed for up to 7 days for cough. 28 capsule Sakia Schrimpf, [place] C, PA-C     PDMP not reviewed this encounter.   Janith Lima,  [place] 02/29/20 1333

## 2020-03-01 LAB — SARS-COV-2, NAA 2 DAY TAT

## 2020-03-01 LAB — NOVEL CORONAVIRUS, NAA: SARS-CoV-2, NAA: DETECTED — AB

## 2020-03-03 LAB — CULTURE, GROUP A STREP (THRC)

## 2020-03-13 ENCOUNTER — Ambulatory Visit: Payer: Medicare Other | Admitting: Orthopaedic Surgery

## 2020-03-16 DIAGNOSIS — M545 Low back pain, unspecified: Secondary | ICD-10-CM | POA: Diagnosis not present

## 2020-03-16 DIAGNOSIS — G8929 Other chronic pain: Secondary | ICD-10-CM | POA: Diagnosis not present

## 2020-03-16 DIAGNOSIS — Z1152 Encounter for screening for COVID-19: Secondary | ICD-10-CM | POA: Diagnosis not present

## 2020-03-16 DIAGNOSIS — Z79899 Other long term (current) drug therapy: Secondary | ICD-10-CM | POA: Diagnosis not present

## 2020-03-20 ENCOUNTER — Ambulatory Visit: Payer: Medicare Other | Admitting: Orthopaedic Surgery

## 2020-03-22 DIAGNOSIS — G8929 Other chronic pain: Secondary | ICD-10-CM | POA: Diagnosis not present

## 2020-03-22 DIAGNOSIS — Z79899 Other long term (current) drug therapy: Secondary | ICD-10-CM | POA: Diagnosis not present

## 2020-03-22 DIAGNOSIS — F411 Generalized anxiety disorder: Secondary | ICD-10-CM | POA: Diagnosis not present

## 2020-03-22 DIAGNOSIS — G4700 Insomnia, unspecified: Secondary | ICD-10-CM | POA: Diagnosis not present

## 2020-03-29 ENCOUNTER — Encounter: Payer: Self-pay | Admitting: Physician Assistant

## 2020-03-29 ENCOUNTER — Ambulatory Visit (INDEPENDENT_AMBULATORY_CARE_PROVIDER_SITE_OTHER): Payer: Medicare Other | Admitting: Physician Assistant

## 2020-03-29 VITALS — BP 98/64 | HR 78 | Ht 60.0 in | Wt 155.0 lb

## 2020-03-29 DIAGNOSIS — Z86010 Personal history of colonic polyps: Secondary | ICD-10-CM | POA: Diagnosis not present

## 2020-03-29 DIAGNOSIS — K219 Gastro-esophageal reflux disease without esophagitis: Secondary | ICD-10-CM | POA: Diagnosis not present

## 2020-03-29 MED ORDER — PANTOPRAZOLE SODIUM 40 MG PO TBEC: 40.0000 mg | DELAYED_RELEASE_TABLET | Freq: Every day | ORAL | 11 refills | Status: DC

## 2020-03-29 NOTE — Patient Instructions (Addendum)
If you are age 66 or older, your body mass index should be between 23-30. Your Body mass index is 30.27 kg/m. If this is out of the aforementioned range listed, please consider follow up with your Primary Care Provider.  If you are age 55 or younger, your body mass index should be between 19-25. Your Body mass index is 30.27 kg/m. If this is out of the aformentioned range listed, please consider follow up with your Primary Care Provider.   We have sent refill of your Pantoprazole to your pharmacy.  Follow up as needed.  Thank you for entrusting me with your care and choosing [facility].  Amy Esterwood, PA-C

## 2020-03-29 NOTE — Progress Notes (Signed)
Subjective:    Patient ID: Courtney [name], female    DOB: 05-20-1955, 65 y.o.   MRN: [id number]  HPI Courtney [name] is a pleasant 65 year old African-American female, established with Dr. Silverio Decamp, and also known to myself.  She was last seen here in February 2020 with complaints of right lower quadrant pain and was treated at that time for diverticulitis. She comes in today for follow-up and with recent complaints of lower abdominal pain which has been in the right lower quadrant and radiating through to the back over the past several months.  She says she has had this same pain intermittently since 1985.  It had flared up again several months ago and then finally eased off and has resolved as of the past couple of weeks.  She describes it as a nagging aching sensation deep in the right lower quadrant which feels as if it goes through to her back at times.  She says it is never tender to palpation.  Bowel movements have been normal, no melena or hematochezia.  Appetite has been fine, weight has been stable.  She has been successful in weaning herself off of OxyContin over the past several months and continues to use oxycodone on a as needed basis for chronic pain.  She is followed by pain clinic.  She has history of MS and fibromyalgia, generalized anxiety disorder, adult onset diabetes mellitus, hypertension, history of adenomatous colon polyps. She is status post cholecystectomy appendectomy hysterectomy and BSO as well as exploratory lap for small bowel obstruction. Patient has been concerned that she may have had a small ovarian cyst as she knows that she has a small ovarian remnant.  She did have pelvic ultrasound done in November 2021 which was unremarkable. CT of the abdomen pelvis for similar complaints in 2019 showed scattered diverticulosis, and a couple of small abdominal wall hernias. Patient also had a recent episode of COVID-19, she has been vaccinated and received booster, and had a mild  episode.  Review of Systems Pertinent positive and negative review of systems were noted in the above HPI section.  All other review of systems was otherwise negative.  Outpatient Encounter Medications as of 03/29/2020  Medication Sig  . acetaminophen (TYLENOL) 500 MG tablet Take 1,000 mg by mouth every 8 (eight) hours as needed for headache.  . alprazolam (XANAX) 2 MG tablet Take 2 mg by mouth 3 (three) times daily as needed for anxiety.   Marland Kitchen amLODipine (NORVASC) 5 MG tablet Take 5 mg by mouth at bedtime.  . Blood Glucose Monitoring Suppl (FIFTY50 GLUCOSE METER 2.0) w/Device KIT Use to check blood sugar two times a day DX:E11.9  . Cyanocobalamin (VITAMIN B 12 PO) Take by mouth daily. gummies  . empagliflozin (JARDIANCE) 10 MG TABS tablet Take 10 mg by mouth daily.  Marland Kitchen glucose blood (FREESTYLE LITE) test strip USE 1 STRIP TWO TIMES A DAY, on insulin DX:E11.9  . insulin aspart protamine- aspart (NOVOLOG MIX 70/30) (70-30) 100 UNIT/ML injection Inject 18-20 Units into the skin 2 (two) times daily. Inject into skin 15 unit with breakfast, and 20 units with supper daily.  . Insulin Pen Needle (BD ULTRA-FINE PEN NEEDLES) 29G X 12.7MM MISC To be used with pen two to three times a day.  . Lancets (FREESTYLE) lancets 1 Device by Misc.(Non-Drug; Combo Route) route daily. USED TO CHECK BLOOD SUGAR TID  . lidocaine (XYLOCAINE) 5 % ointment Apply 1 application topically daily as needed for mild pain.  Marland Kitchen loratadine (CLARITIN)  10 MG tablet Take by mouth.  . Multiple Vitamin (MULTIVITAMIN) tablet Take 2 tablets by mouth daily. Gummies  . mupirocin ointment (BACTROBAN) 2 % Apply 1 application topically 2 (two) times daily. (Patient taking differently: Apply 1 application topically as needed.)  . naloxone (NARCAN) nasal spray 4 mg/0.1 mL Place 1 spray into the nose daily as needed (overdose).  Marland Kitchen oxyCODONE (ROXICODONE) 15 MG immediate release tablet Take 15 mg by mouth 5 (five) times daily as needed.  .  triamcinolone (NASACORT) 55 MCG/ACT AERO nasal inhaler Place 1 spray into the nose daily as needed (allergies).   . [DISCONTINUED] pantoprazole (PROTONIX) 40 MG tablet Take 40 mg by mouth daily.  . pantoprazole (PROTONIX) 40 MG tablet Take 1 tablet (40 mg total) by mouth daily.  . [DISCONTINUED] oxyCODONE (OXYCONTIN) 10 mg 12 hr tablet Take 15 mg by mouth every 5 (five) hours as needed.  . [DISCONTINUED] sucralfate (CARAFATE) 1 g tablet Take 1 tablet (1 g total) by mouth 4 (four) times daily -  before meals and at bedtime. Dissolve 1 tablet in 2-4 oz of water, drink as slurry   No facility-administered encounter medications on file as of 03/29/2020.   Allergies  Allergen Reactions  . Ibuprofen Other (See Comments)    Developed a ulcer  Other reaction(s): Other (See Comments) Developed a ulcer   . Prednisone Anaphylaxis, Shortness Of Breath and Palpitations  . Solu-Medrol [Methylprednisolone] Anaphylaxis, Shortness Of Breath and Palpitations  . Bactrim [Sulfamethoxazole-Trimethoprim] Other (See Comments)    "blotchiness" redness, face swelling  . Biaxin [Clarithromycin] Swelling  . Naproxen Other (See Comments)    Developed a ulcer   . Sulfa Antibiotics Other (See Comments)    Other reaction(s): Angioedema Other reaction(s): Angioedema   Patient Active Problem List   Diagnosis Date Noted  . Pain in left wrist 06/01/2019  . Fracture of distal phalanx of right ring finger 03/15/2019  . 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 Polyp of transverse colon   . Special screening for malignant neoplasms, colon   . History of colonic polyps   . Polyp of sigmoid colon   . Cervicalgia 08/10/2018  . 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 Loss of weight   . RLQ abdominal pain 03/27/2017  . Bloating 03/27/2017  . Abnormal finding on imaging 03/27/2017  . Dilation of biliary tract 03/27/2017  . Nausea 03/27/2017  . Multiple joint pain 03/31/2016  . Essential  tremor 03/31/2016  . Numbness in both hands 03/18/2016  . Hypersomnia 11/09/2015  . Diabetes mellitus ([place]) 07/18/2015  . Essential hypertension 07/18/2015  . Abnormal ECG 07/18/2015  . Gait difficulty 02/09/2015  . Chronic left shoulder pain 02/09/2015  . Depression with anxiety 02/09/2015  . Numbness 02/09/2015  . Insomnia 02/09/2015  . Urinary hesitancy 02/09/2015  . Other fatigue 02/09/2015   Social History   Socioeconomic History  . Marital status: Married    Spouse name: Jimmie  . Number of children: 4  . Years of education: Not on file  . Highest education level: 12th grade  Occupational History  . Occupation: disabled  Tobacco Use  . Smoking status: Former Smoker    Packs/day: 0.25    Years: 5.00    Pack years: 1.25    Types: Cigarettes    Quit date: 1990    Years since quitting: 32.0  . Smokeless tobacco: Never Used  Vaping Use  . Vaping Use: Never used  Substance and Sexual Activity  . Alcohol use:  Yes    Comment: rarely  . Drug use: No  . Sexual activity: Yes  Other Topics Concern  . Not on file  Social History Narrative   Pt is right-handed. She lives with her husband in a 2 story house. Pt avoids caffeine. She and her husband go to the gym, lately once weekly, previously 3 times a week.    Social Determinants of Health   Financial Resource Strain: Not on file  Food Insecurity: Not on file  Transportation Needs: Not on file  Physical Activity: Not on file  Stress: Not on file  Social Connections: Not on file  Intimate Partner Violence: Not on file    [name] family history includes ALS in her brother; Alcohol abuse in her brother; Diabetes in her brother, brother, mother, sister, and sister; Heart disease in her mother; Kidney disease in her mother; Multiple sclerosis in her daughter and another family member; Other (age of onset: 74) in her father.      Objective:    Vitals:   03/29/20 1124  BP: 98/64  Pulse: 78  SpO2: 97%     Physical Exam Well-developed well-nourished older African-American female in no acute distress.  Pleasant height, Weight, BMI30  HEENT; nontraumatic normocephalic, EOMI, PE RR LA, sclera anicteric. Oropharynx; not examined Neck; supple, no JVD Cardiovascular; regular rate and rhythm with S1-S2, no murmur rub or gallop Pulmonary; Clear bilaterally Abdomen; soft, nontender, nondistended, multiple abdominal incisional scars, no palpable hernia, no palpable mass or hepatosplenomegaly, bowel sounds are active Rectal; not done today Skin; benign exam, no jaundice rash or appreciable lesions Extremities; no clubbing cyanosis or edema skin warm and dry Neuro/Psych; alert and oriented x4, grossly nonfocal mood and affect appropriate       Assessment & Plan:   Number one 65 year old African-American female with chronic recurrent lower abdominal pain primarily in the right lower quadrant over many years.  She is status post multiple abdominal surgeries including cholecystectomy appendectomy hysterectomy BSO and exploratory lap for small bowel obstruction. Recent exacerbation of the right lower quadrant pain which had been present over the past few months and is now resolved.  Etiology not entirely clear though strong suspicion for recurrent abdominal discomfort/pain secondary to adhesive disease.  #2 history of previous diverticulitis, known scattered diverticulosis #3 history of adenomatous colon polyps-up-to-date with colonoscopy last done November 2020 and due for interval follow-up 2023 #4 MS 5.  Fibromyalgia 6.  Anxiety disorder 7.  Adult onset diabetes mellitus 8.  Hypertension  Plan; as her pain has resolved over the past couple of weeks I do not think that she needs any further imaging or work-up at this time, we discussed nature of intra-abdominal adhesions status post multiple abdominal surgeries. She is encouraged to call for any recurrence or worsening of symptoms. She will  follow up with Dr. Rush Landmark or myself on an as-needed basis and we will plan follow-up colonoscopy 2023. Protonix 40 mg p.o. every morning refill x1 year.  Jamesmichael Shadd Genia Harold PA-C 03/29/2020   Cc: Burna Cash*

## 2020-04-04 ENCOUNTER — Encounter: Payer: Self-pay | Admitting: Orthopaedic Surgery

## 2020-04-04 ENCOUNTER — Other Ambulatory Visit: Payer: Self-pay

## 2020-04-04 ENCOUNTER — Ambulatory Visit (INDEPENDENT_AMBULATORY_CARE_PROVIDER_SITE_OTHER): Payer: Medicare Other | Admitting: Orthopaedic Surgery

## 2020-04-04 DIAGNOSIS — G8929 Other chronic pain: Secondary | ICD-10-CM

## 2020-04-04 DIAGNOSIS — M25512 Pain in left shoulder: Secondary | ICD-10-CM | POA: Diagnosis not present

## 2020-04-04 MED ORDER — BUPIVACAINE HCL 0.25 % IJ SOLN
2.0000 mL | INTRAMUSCULAR | Status: AC | PRN
Start: 2020-04-04 — End: 2020-04-04
  Administered 2020-04-04: 2 mL via INTRA_ARTICULAR

## 2020-04-04 NOTE — Progress Notes (Signed)
Office Visit Note   Patient: Courtney [name]           Date of Birth: 1956/02/08           MRN: [id number] Visit Date: 04/04/2020              Requested by: Jenel Lucks, [place],  [place] [id number] PCP: Jenel Lucks, PA-C   Assessment & Plan: Visit Diagnoses:  1. Chronic left shoulder pain     Plan: Left shoulder pain with adhesive capsulitis.  Will inject the subacromial space with betamethasone and monitor response.  If no improvement over the next 3 to 4 weeks we will obtain an MRI scan.  Had a similar problem over a year and a half ago that responded nicely to a closed manipulation of her shoulder.  Did well for months until she had recurrent pain.  Presently is in a pain management program  Follow-Up Instructions: Return if symptoms worsen or fail to improve.   Orders:  Orders Placed This Encounter  Procedures  . Large Joint Inj: L subacromial bursa   No orders of the defined types were placed in this encounter.     Procedures: Large Joint Inj: L subacromial bursa on 04/04/2020 11:31 AM Indications: pain and diagnostic evaluation Details: 25 G 1.5 in needle, anterolateral approach  Arthrogram: No  Medications: 2 mL bupivacaine 0.25 %  12 mg betamethasone injected the subacromial space left shoulder with Marcaine Consent was given by the patient. Immediately prior to procedure a time out was called to verify the correct patient, procedure, equipment, support staff and site/side marked as required. Patient was prepped and draped in the usual sterile fashion.       Clinical Data: No additional findings.   Subjective: Chief Complaint  Patient presents with  . Left Shoulder - Pain  Patient presents today for her left shoulder. She said that it has been hurting for two years. Her pain is located all throughout her shoulder and into her biceps. She said that it feels like it dislocates. She has difficulty sleeping.  Her range of motion has decreased. Numbness and tingling into both arms. She is currently in pain management and using a CBD ointment. She is right hand dominant. She has a history of shoulder manipulation in June of 2020.  No history of injury or trauma.  Did very well after manipulation of the left shoulder a year and a half ago only to have it slowly recur.  She is diabetic  HPI  Review of Systems   Objective: Vital Signs: Ht 5\' 3"  (1.6 m)   Wt 153 lb (69.4 kg)   BMI 27.10 kg/m   Physical Exam Constitutional:      Appearance: She is well-developed and well-nourished.  HENT:     Mouth/Throat:     Mouth: Oropharynx is clear and moist.  Eyes:     Extraocular Movements: EOM normal.     Pupils: Pupils are equal, round, and reactive to light.  Pulmonary:     Effort: Pulmonary effort is normal.  Skin:    General: Skin is warm and dry.  Neurological:     Mental Status: She is alert and oriented to person, place, and time.  Psychiatric:        Mood and Affect: Mood and affect normal.        Behavior: Behavior normal.     Ortho Exam awake alert.  In no acute distress.  Pain with motion of her left shoulder in any plane.  I can only abduct and flex about 80 degrees at which point she was uncomfortable and had a hard endpoint.  Difficult to further assess the shoulder because of her pain she had multiple areas of tenderness both anteriorly and posteriorly.  Biceps appeared to be intact.  Has good grip and release  Specialty Comments:  No specialty comments available.  Imaging: No results found.   PMFS History: Patient Active Problem List   Diagnosis Date Noted  . Pain in left shoulder 04/04/2020  . Pain in left wrist 06/01/2019  . Fracture of distal phalanx of right ring finger 03/15/2019  . 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 Polyp of transverse colon   . Special screening for malignant neoplasms, colon   . History of colonic polyps   . Polyp of  sigmoid colon   . Cervicalgia 08/10/2018  . 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 Loss of weight   . RLQ abdominal pain 03/27/2017  . Bloating 03/27/2017  . Abnormal finding on imaging 03/27/2017  . Dilation of biliary tract 03/27/2017  . Nausea 03/27/2017  . Multiple joint pain 03/31/2016  . Essential tremor 03/31/2016  . Numbness in both hands 03/18/2016  . Hypersomnia 11/09/2015  . Diabetes mellitus ([place]) 07/18/2015  . Essential hypertension 07/18/2015  . Abnormal ECG 07/18/2015  . Gait difficulty 02/09/2015  . Chronic left shoulder pain 02/09/2015  . Depression with anxiety 02/09/2015  . Numbness 02/09/2015  . Insomnia 02/09/2015  . Urinary hesitancy 02/09/2015  . Other fatigue 02/09/2015   Past Medical History:  Diagnosis Date  . Abnormal ECG 07/18/2015  . Arthritis   . Asthma   . Bowel obstruction ([place])   . Depression with anxiety 02/09/2015   mild xanax prn  . Diabetes mellitus ([place]) 07/18/2015   type 2  . Diverticulitis   . Dysrhythmia    abnormal ekgs worked up  . Essential tremor 03/31/2016  . Fibromyalgia   . Gait difficulty 02/09/2015  . Gallstones   . GERD (gastroesophageal reflux disease)   . HLD (hyperlipidemia)   . Hypersomnia 11/09/2015  . Hypertension    no meds  . Hyperthyroidism    patient states has no problems at present-01/25/2019  . Hypothyroidism    patient states no problems at present 01/25/2019  . IBS (irritable bowel syndrome)   . Insomnia 02/09/2015  . Migraines   . MS (multiple sclerosis) ([place])   . Multiple joint pain 03/31/2016  . Numbness in both hands 03/18/2016  . Other fatigue 02/09/2015  . Peptic ulcer   . Pneumonia   . PONV (postoperative nausea and vomiting)   . Urinary hesitancy 02/09/2015  . Vision abnormalities     Family History  Problem Relation Age of Onset  . Heart disease Mother   . Kidney disease Mother   . Diabetes Mother   . Other Father 75       murdered  . Multiple sclerosis Daughter   .  Multiple sclerosis Other   . Diabetes Sister   . Alcohol abuse Brother        ETOH and marijuana  . Diabetes Brother   . Diabetes Sister   . Diabetes Brother   . ALS Brother     Past Surgical History:  Procedure Laterality Date  . ABDOMINAL HYSTERECTOMY  1976   including oophorectomy, due to uterine cancer  . APPENDECTOMY  1974  . BIOPSY  01/31/2019  Procedure: BIOPSY;  Surgeon: Mauri Pole, MD;  Location: [facility];  Service: Endoscopy;;  . CARPAL TUNNEL RELEASE Left    carpal tunnel surgery  . CHOLECYSTECTOMY     2006  . COLON SURGERY     SBO x 2  . COLONOSCOPY WITH PROPOFOL N/A 01/31/2019   Procedure: COLONOSCOPY WITH PROPOFOL;  Surgeon: Mauri Pole, MD;  Location: [facility];  Service: Endoscopy;  Laterality: N/A;  . EUS N/A 04/09/2017   Procedure: UPPER ENDOSCOPIC ULTRASOUND (EUS) LINEAR;  Surgeon: Milus Banister, MD;  Location: [facility];  Service: Endoscopy;  Laterality: N/A;  . HERNIA REPAIR    . MENISCUS REPAIR Left   . POLYPECTOMY  01/31/2019   Procedure: POLYPECTOMY;  Surgeon: Mauri Pole, MD;  Location: [facility];  Service: Endoscopy;;  . SMALL INTESTINE SURGERY    . toenail removal Right 12/2017   great toenail    Social History   Occupational History  . Occupation: disabled  Tobacco Use  . Smoking status: Former Smoker    Packs/day: 0.25    Years: 5.00    Pack years: 1.25    Types: Cigarettes    Quit date: 1990    Years since quitting: 32.1  . Smokeless tobacco: Never Used  Vaping Use  . Vaping Use: Never used  Substance and Sexual Activity  . Alcohol use: Yes    Comment: rarely  . Drug use: No  . Sexual activity: Yes

## 2020-04-10 DIAGNOSIS — E785 Hyperlipidemia, unspecified: Secondary | ICD-10-CM | POA: Insufficient documentation

## 2020-04-10 DIAGNOSIS — R590 Localized enlarged lymph nodes: Secondary | ICD-10-CM | POA: Diagnosis not present

## 2020-04-10 DIAGNOSIS — I2510 Atherosclerotic heart disease of native coronary artery without angina pectoris: Secondary | ICD-10-CM | POA: Diagnosis not present

## 2020-04-10 DIAGNOSIS — F411 Generalized anxiety disorder: Secondary | ICD-10-CM | POA: Diagnosis not present

## 2020-04-10 DIAGNOSIS — Z794 Long term (current) use of insulin: Secondary | ICD-10-CM | POA: Diagnosis not present

## 2020-04-10 DIAGNOSIS — E538 Deficiency of other specified B group vitamins: Secondary | ICD-10-CM | POA: Diagnosis not present

## 2020-04-10 DIAGNOSIS — E119 Type 2 diabetes mellitus without complications: Secondary | ICD-10-CM | POA: Diagnosis not present

## 2020-04-10 DIAGNOSIS — F339 Major depressive disorder, recurrent, unspecified: Secondary | ICD-10-CM | POA: Diagnosis not present

## 2020-04-10 DIAGNOSIS — R351 Nocturia: Secondary | ICD-10-CM | POA: Diagnosis not present

## 2020-04-10 DIAGNOSIS — I10 Essential (primary) hypertension: Secondary | ICD-10-CM | POA: Diagnosis not present

## 2020-04-11 DIAGNOSIS — M7502 Adhesive capsulitis of left shoulder: Secondary | ICD-10-CM | POA: Diagnosis not present

## 2020-04-11 DIAGNOSIS — M545 Low back pain, unspecified: Secondary | ICD-10-CM | POA: Diagnosis not present

## 2020-04-11 DIAGNOSIS — M2550 Pain in unspecified joint: Secondary | ICD-10-CM | POA: Diagnosis not present

## 2020-04-11 DIAGNOSIS — Z79899 Other long term (current) drug therapy: Secondary | ICD-10-CM | POA: Diagnosis not present

## 2020-04-11 DIAGNOSIS — G8929 Other chronic pain: Secondary | ICD-10-CM | POA: Diagnosis not present

## 2020-04-18 ENCOUNTER — Other Ambulatory Visit: Payer: Self-pay | Admitting: Internal Medicine

## 2020-04-18 DIAGNOSIS — R590 Localized enlarged lymph nodes: Secondary | ICD-10-CM

## 2020-04-19 ENCOUNTER — Ambulatory Visit
Admission: RE | Admit: 2020-04-19 | Discharge: 2020-04-19 | Disposition: A | Discharge status: Home / Self Care | Payer: Medicare Other | Source: Ambulatory Visit | Attending: Internal Medicine | Admitting: Internal Medicine

## 2020-04-19 DIAGNOSIS — G8929 Other chronic pain: Secondary | ICD-10-CM | POA: Diagnosis not present

## 2020-04-19 DIAGNOSIS — R599 Enlarged lymph nodes, unspecified: Secondary | ICD-10-CM | POA: Diagnosis not present

## 2020-04-19 DIAGNOSIS — R590 Localized enlarged lymph nodes: Secondary | ICD-10-CM

## 2020-04-19 DIAGNOSIS — Z79899 Other long term (current) drug therapy: Secondary | ICD-10-CM | POA: Diagnosis not present

## 2020-04-19 DIAGNOSIS — F411 Generalized anxiety disorder: Secondary | ICD-10-CM | POA: Diagnosis not present

## 2020-04-19 DIAGNOSIS — G4700 Insomnia, unspecified: Secondary | ICD-10-CM | POA: Diagnosis not present

## 2020-04-30 NOTE — Progress Notes (Signed)
Reviewed and agree with documentation and assessment and plan. K. Veena Nandigam , MD   

## 2020-05-01 ENCOUNTER — Ambulatory Visit: Payer: Medicare Other | Admitting: Family

## 2020-05-04 ENCOUNTER — Other Ambulatory Visit: Payer: Self-pay

## 2020-05-04 ENCOUNTER — Telehealth (INDEPENDENT_AMBULATORY_CARE_PROVIDER_SITE_OTHER): Payer: Medicare Other | Admitting: Family

## 2020-05-04 DIAGNOSIS — Z7689 Persons encountering health services in other specified circumstances: Secondary | ICD-10-CM | POA: Diagnosis not present

## 2020-05-04 DIAGNOSIS — Z794 Long term (current) use of insulin: Secondary | ICD-10-CM

## 2020-05-04 DIAGNOSIS — E119 Type 2 diabetes mellitus without complications: Secondary | ICD-10-CM

## 2020-05-04 NOTE — Progress Notes (Signed)
Establish care Wants referral to Endocrinology for diabetes

## 2020-05-04 NOTE — Progress Notes (Signed)
Virtual Visit via Telephone Note  I connected with Courtney [name], on 05/04/2020 at 8:55 AM by telephone due to the COVID-19 pandemic and verified that I am speaking with the correct person using two identifiers.  Due to current restrictions/limitations of in-office visits due to the COVID-19 pandemic, this scheduled clinical appointment was converted to a telehealth visit.   Consent: I discussed the limitations, risks, security and privacy concerns of performing an evaluation and management service by telephone and the availability of in person appointments. I also discussed with the patient that there may be a patient responsible charge related to this service. The patient expressed understanding and agreed to proceed.   Location of Patient: Home   Location of Provider:  Primary Care at [place] participating in Telemedicine visit: [place], NP Elmon Else, [place]   History of Present Illness: Courtney [name]. Courtney [name] is a 65 year-old female who presents to establish care. PMH significant for essential hypertension, diabetes mellitus, essential tremor, depression with anxiety, and hypersomnia.   Current issues and/or concerns: Patient is diabetic and requesting referral to Endocrinology for management. Reports blood sugars are getting low. For example 100-105 on most days in the morning. Did have an instance of 55 one morning. Currently taking 15 units of Novolog insulin twice day for the last 3 to 4 months as per medication adjustment from Internal Medicine in [place]. She is still taking Jardiance.    Past Medical History:  Diagnosis Date  . Abnormal ECG 07/18/2015  . Arthritis   . Asthma   . Bowel obstruction ([place])   . Depression with anxiety 02/09/2015   mild xanax prn  . Diabetes mellitus ([place]) 07/18/2015   type 2  . Diverticulitis   . Dysrhythmia    abnormal ekgs worked up  . Essential tremor 03/31/2016  .  Fibromyalgia   . Gait difficulty 02/09/2015  . Gallstones   . GERD (gastroesophageal reflux disease)   . HLD (hyperlipidemia)   . Hypersomnia 11/09/2015  . Hypertension    no meds  . Hyperthyroidism    patient states has no problems at present-01/25/2019  . Hypothyroidism    patient states no problems at present 01/25/2019  . IBS (irritable bowel syndrome)   . Insomnia 02/09/2015  . Migraines   . MS (multiple sclerosis) ([place])   . Multiple joint pain 03/31/2016  . Numbness in both hands 03/18/2016  . Other fatigue 02/09/2015  . Peptic ulcer   . Pneumonia   . PONV (postoperative nausea and vomiting)   . Urinary hesitancy 02/09/2015  . Vision abnormalities    Allergies  Allergen Reactions  . Ibuprofen Other (See Comments)    Developed a ulcer  Other reaction(s): Other (See Comments) Developed a ulcer   . Prednisone Anaphylaxis, Shortness Of Breath and Palpitations  . Solu-Medrol [Methylprednisolone] Anaphylaxis, Shortness Of Breath and Palpitations  . Bactrim [Sulfamethoxazole-Trimethoprim] Other (See Comments)    "blotchiness" redness, face swelling  . Biaxin [Clarithromycin] Swelling  . Naproxen Other (See Comments)    Developed a ulcer   . Sulfa Antibiotics Other (See Comments)    Other reaction(s): Angioedema Other reaction(s): Angioedema    Current Outpatient Medications on File Prior to Visit  Medication Sig Dispense Refill  . acetaminophen (TYLENOL) 500 MG tablet Take 1,000 mg by mouth every 8 (eight) hours as needed for headache.    . alprazolam (XANAX) 2 MG tablet Take 2 mg by mouth 3 (three) times  daily as needed for anxiety.     Marland Kitchen amLODipine (NORVASC) 5 MG tablet Take 5 mg by mouth at bedtime.    . Blood Glucose Monitoring Suppl (FIFTY50 GLUCOSE METER 2.0) w/Device KIT Use to check blood sugar two times a day DX:E11.9    . Cyanocobalamin (VITAMIN B 12 PO) Take by mouth daily. gummies    . empagliflozin (JARDIANCE) 10 MG TABS tablet Take 10 mg by mouth daily.    Marland Kitchen  glucose blood (FREESTYLE LITE) test strip USE 1 STRIP TWO TIMES A DAY, on insulin DX:E11.9    . insulin aspart protamine- aspart (NOVOLOG MIX 70/30) (70-30) 100 UNIT/ML injection Inject 18-20 Units into the skin 2 (two) times daily. Inject into skin 15 unit with breakfast, and 20 units with supper daily.    . Insulin Pen Needle (BD ULTRA-FINE PEN NEEDLES) 29G X 12.7MM MISC To be used with pen two to three times a day.    . Lancets (FREESTYLE) lancets 1 Device by Misc.(Non-Drug; Combo Route) route daily. USED TO CHECK BLOOD SUGAR TID    . lidocaine (XYLOCAINE) 5 % ointment Apply 1 application topically daily as needed for mild pain.    Marland Kitchen loratadine (CLARITIN) 10 MG tablet Take by mouth.    . Multiple Vitamin (MULTIVITAMIN) tablet Take 2 tablets by mouth daily. Gummies    . mupirocin ointment (BACTROBAN) 2 % Apply 1 application topically 2 (two) times daily. (Patient taking differently: Apply 1 application topically as needed.) 22 g 0  . naloxone (NARCAN) nasal spray 4 mg/0.1 mL Place 1 spray into the nose daily as needed (overdose).    Marland Kitchen oxyCODONE (ROXICODONE) 15 MG immediate release tablet Take 15 mg by mouth 5 (five) times daily as needed.    . pantoprazole (PROTONIX) 40 MG tablet Take 1 tablet (40 mg total) by mouth daily. 30 tablet 11  . triamcinolone (NASACORT) 55 MCG/ACT AERO nasal inhaler Place 1 spray into the nose daily as needed (allergies).     . [DISCONTINUED] sucralfate (CARAFATE) 1 g tablet Take 1 tablet (1 g total) by mouth 4 (four) times daily -  before meals and at bedtime. Dissolve 1 tablet in 2-4 oz of water, drink as slurry 120 tablet 1   No current facility-administered medications on file prior to visit.    Observations/Objective: Alert and oriented x 3. Not in acute distress. Physical examination not completed as this is a telemedicine visit.  Assessment and Plan: 1. Encounter to establish care: - Patient presents today to establish care.  - Return for annual physical  examination, labs, and health maintenance. Arrive fasting meaning having had no food and/or nothing to drink for at least 8 hours prior to appointment.  Please take scheduled medications as normal.  2. Type 2 diabetes mellitus without complication, with long-term current use of insulin [facility]): - Patient is diabetic and requesting referral to Endocrinology for management. Reports blood sugars are getting low. For example 100-105 on most days in the morning. Did have an instance of 55 one morning. Currently taking 15 units of Novolog insulin twice day for the last 3 to 4 months as per medication adjustment from Internal Medicine in [place]. She is still taking Jardiance.  - Per patient request referral to Endocrinology for further evaluation and management.  - Ambulatory referral to Endocrinology   Follow Up Instructions: Return for annual physical exam. Referral to Endocrinology.   Patient was given clear instructions to go to Emergency Department or return to medical center if symptoms  don't improve, worsen, or new problems develop.The patient verbalized understanding.  I discussed the assessment and treatment plan with the patient. The patient was provided an opportunity to ask questions and all were answered. The patient agreed with the plan and demonstrated an understanding of the instructions.   The patient was advised to call back or seek an in-person evaluation if the symptoms worsen or if the condition fails to improve as anticipated.   I provided 15 minutes total of non-face-to-face time during this encounter including median intraservice time, reviewing previous notes, labs, imaging, medications, management and patient verbalized understanding.    Camillia Herter, NP  [facility] at [facility] [place], [place] 05/04/2020, 8:55 AM

## 2020-05-09 DIAGNOSIS — G8929 Other chronic pain: Secondary | ICD-10-CM | POA: Diagnosis not present

## 2020-05-09 DIAGNOSIS — M545 Low back pain, unspecified: Secondary | ICD-10-CM | POA: Diagnosis not present

## 2020-05-09 DIAGNOSIS — Z79899 Other long term (current) drug therapy: Secondary | ICD-10-CM | POA: Diagnosis not present

## 2020-05-16 DIAGNOSIS — E119 Type 2 diabetes mellitus without complications: Secondary | ICD-10-CM | POA: Diagnosis not present

## 2020-05-16 DIAGNOSIS — Z0000 Encounter for general adult medical examination without abnormal findings: Secondary | ICD-10-CM | POA: Diagnosis not present

## 2020-05-16 DIAGNOSIS — E559 Vitamin D deficiency, unspecified: Secondary | ICD-10-CM | POA: Diagnosis not present

## 2020-05-16 DIAGNOSIS — Z79899 Other long term (current) drug therapy: Secondary | ICD-10-CM | POA: Diagnosis not present

## 2020-05-16 DIAGNOSIS — R0602 Shortness of breath: Secondary | ICD-10-CM | POA: Diagnosis not present

## 2020-05-16 DIAGNOSIS — R5383 Other fatigue: Secondary | ICD-10-CM | POA: Diagnosis not present

## 2020-06-13 DIAGNOSIS — F411 Generalized anxiety disorder: Secondary | ICD-10-CM | POA: Diagnosis not present

## 2020-06-13 DIAGNOSIS — Z79899 Other long term (current) drug therapy: Secondary | ICD-10-CM | POA: Diagnosis not present

## 2020-06-13 DIAGNOSIS — G8929 Other chronic pain: Secondary | ICD-10-CM | POA: Diagnosis not present

## 2020-06-14 DIAGNOSIS — R3989 Other symptoms and signs involving the genitourinary system: Secondary | ICD-10-CM | POA: Diagnosis not present

## 2020-06-14 DIAGNOSIS — G8929 Other chronic pain: Secondary | ICD-10-CM | POA: Diagnosis not present

## 2020-06-14 DIAGNOSIS — Z79899 Other long term (current) drug therapy: Secondary | ICD-10-CM | POA: Diagnosis not present

## 2020-06-14 DIAGNOSIS — M545 Low back pain, unspecified: Secondary | ICD-10-CM | POA: Diagnosis not present

## 2020-06-20 ENCOUNTER — Other Ambulatory Visit: Payer: Self-pay

## 2020-06-20 ENCOUNTER — Ambulatory Visit (INDEPENDENT_AMBULATORY_CARE_PROVIDER_SITE_OTHER): Payer: Medicare Other | Admitting: Internal Medicine

## 2020-06-20 ENCOUNTER — Encounter: Payer: Self-pay | Admitting: Internal Medicine

## 2020-06-20 VITALS — BP 126/86 | HR 88 | Ht 63.0 in | Wt 158.0 lb

## 2020-06-20 DIAGNOSIS — E1142 Type 2 diabetes mellitus with diabetic polyneuropathy: Secondary | ICD-10-CM | POA: Diagnosis not present

## 2020-06-20 DIAGNOSIS — Z794 Long term (current) use of insulin: Secondary | ICD-10-CM | POA: Diagnosis not present

## 2020-06-20 LAB — POCT GLUCOSE (DEVICE FOR HOME USE): POC Glucose: 146 mg/dl — AB (ref 70–99)

## 2020-06-20 LAB — POCT GLYCOSYLATED HEMOGLOBIN (HGB A1C): Hemoglobin A1C: 6.4 % — AB (ref 4.0–5.6)

## 2020-06-20 MED ORDER — INSULIN ASPART PROT & ASPART (70-30 MIX) 100 UNIT/ML PEN: 14.0000 [IU] | PEN_INJECTOR | Freq: Two times a day (BID) | SUBCUTANEOUS | 3 refills | Status: DC

## 2020-06-20 MED ORDER — EMPAGLIFLOZIN 25 MG PO TABS: 25.0000 mg | ORAL_TABLET | Freq: Every day | ORAL | 3 refills | Status: DC

## 2020-06-20 MED ORDER — GLOBAL EASY GLIDE PEN NEEDLES 32G X 4 MM MISC: 1.0000 | Freq: Two times a day (BID) | 3 refills | Status: DC

## 2020-06-20 NOTE — Patient Instructions (Signed)
-   Continue Jardiance 25 mg 1 tablet daily  - Decrease Novolog 14 units with breakfast and supper      HOW TO TREAT LOW BLOOD SUGARS (Blood sugar LESS THAN 70 MG/DL)  Please follow the RULE OF 15 for the treatment of hypoglycemia treatment (when your (blood sugars are less than 70 mg/dL)    STEP 1: Take 15 grams of carbohydrates when your blood sugar is low, which includes:   3-4 GLUCOSE TABS  OR  3-4 OZ OF JUICE OR REGULAR SODA OR  ONE TUBE OF GLUCOSE GEL     STEP 2: RECHECK blood sugar in 15 MINUTES STEP 3: If your blood sugar is still low at the 15 minute recheck --> then, go back to STEP 1 and treat AGAIN with another 15 grams of carbohydrates.

## 2020-06-20 NOTE — Progress Notes (Signed)
Name: Courtney [name]  MRN/ DOB: [id number], 1955-06-07   Age/ Sex: 65 y.o., female    PCP: Camillia Herter, NP   Reason for Endocrinology Evaluation: Type 2 Diabetes Mellitus     Date of Initial Endocrinology Visit: 06/20/2020     PATIENT IDENTIFIER: Courtney [name] is a 65 y.o. female with a past medical history of T2DM, MS , HTN and essential tremors. The patient presented for initial endocrinology clinic visit on 06/20/2020 for consultative assistance with her diabetes management.    HPI: Courtney [name] was    Diagnosed with DM in 2008 Prior Medications tried/Intolerance: has been on insulin since 2011. Victoza- nausea  Currently checking blood sugars occasionally   Hypoglycemia episodes : yes            Symptoms: yes              Frequency: rarely Hemoglobin A1c has ranged from 7.3% in 2017, peaking at 8.0% in years ago. Patient required assistance for hypoglycemia:  Patient has required hospitalization within the last 1 year from hyper or hypoglycemia:   In terms of diet, the patient eats 2 meals a day, snacks for lunch  And bedtime . Avoids sugar sweetened beverages    Less hypoglycemia with reduction of insulin dose    Denies nausea or diarrhea   HOME DIABETES REGIMEN: Jardiance 25 mg daily  Novolog Mix 15 units BID    Statin: no ACE-I/ARB: no Prior Diabetic Education: no    METER DOWNLOAD SUMMARY: Did nor bring    DIABETIC COMPLICATIONS: Microvascular complications:   Neuropathy  Denies: retinopathy, CKD   Last eye exam: Completed  2021  Macrovascular complications:    Denies: CAD, PVD, CVA   PAST HISTORY: Past Medical History:  Past Medical History:  Diagnosis Date  . Abnormal ECG 07/18/2015  . Arthritis   . Asthma   . Bowel obstruction ([place])   . Depression with anxiety 02/09/2015   mild xanax prn  . Diabetes mellitus ([place]) 07/18/2015   type 2  . Diverticulitis   . Dysrhythmia    abnormal ekgs worked up  . Essential  tremor 03/31/2016  . Fibromyalgia   . Gait difficulty 02/09/2015  . Gallstones   . GERD (gastroesophageal reflux disease)   . HLD (hyperlipidemia)   . Hypersomnia 11/09/2015  . Hypertension    no meds  . Hyperthyroidism    patient states has no problems at present-01/25/2019  . Hypothyroidism    patient states no problems at present 01/25/2019  . IBS (irritable bowel syndrome)   . Insomnia 02/09/2015  . Migraines   . MS (multiple sclerosis) ([place])   . Multiple joint pain 03/31/2016  . Numbness in both hands 03/18/2016  . Other fatigue 02/09/2015  . Peptic ulcer   . Pneumonia   . PONV (postoperative nausea and vomiting)   . Urinary hesitancy 02/09/2015  . Vision abnormalities    Past Surgical History:  Past Surgical History:  Procedure Laterality Date  . ABDOMINAL HYSTERECTOMY  1976   including oophorectomy, due to uterine cancer  . APPENDECTOMY  1974  . BIOPSY  01/31/2019   Procedure: BIOPSY;  Surgeon: Mauri Pole, MD;  Location: [facility];  Service: Endoscopy;;  . CARPAL TUNNEL RELEASE Left    carpal tunnel surgery  . CHOLECYSTECTOMY     2006  . COLON SURGERY     SBO x 2  . COLONOSCOPY WITH PROPOFOL N/A 01/31/2019   Procedure: COLONOSCOPY WITH  PROPOFOL;  Surgeon: Mauri Pole, MD;  Location: [facility];  Service: Endoscopy;  Laterality: N/A;  . EUS N/A 04/09/2017   Procedure: UPPER ENDOSCOPIC ULTRASOUND (EUS) LINEAR;  Surgeon: Milus Banister, MD;  Location: [facility];  Service: Endoscopy;  Laterality: N/A;  . HERNIA REPAIR    . MENISCUS REPAIR Left   . POLYPECTOMY  01/31/2019   Procedure: POLYPECTOMY;  Surgeon: Mauri Pole, MD;  Location: [facility];  Service: Endoscopy;;  . SMALL INTESTINE SURGERY    . toenail removal Right 12/2017   great toenail       Social History:  reports that she quit smoking about 32 years ago. Her smoking use included cigarettes. She has a 1.25 pack-year smoking history. She has never used smokeless tobacco. She  reports current alcohol use. She reports that she does not use drugs. Family History:  Family History  Problem Relation Age of Onset  . Heart disease Mother   . Kidney disease Mother   . Diabetes Mother   . Other Father 62       murdered  . Multiple sclerosis Daughter   . Multiple sclerosis Other   . Diabetes Sister   . Alcohol abuse Brother        ETOH and marijuana  . Diabetes Brother   . Diabetes Sister   . Diabetes Brother   . ALS Brother      HOME MEDICATIONS: Allergies as of 06/20/2020      Reactions   Ibuprofen Other (See Comments)   Developed a ulcer  Other reaction(s): Other (See Comments) Developed a ulcer    Prednisone Anaphylaxis, Shortness Of Breath, Palpitations   Solu-medrol [methylprednisolone] Anaphylaxis, Shortness Of Breath, Palpitations   Bactrim [sulfamethoxazole-trimethoprim] Other (See Comments)   "blotchiness" redness, face swelling   Biaxin [clarithromycin] Swelling   Naproxen Other (See Comments)   Developed a ulcer    Sulfa Antibiotics Other (See Comments)   Other reaction(s): Angioedema Other reaction(s): Angioedema      Medication List       Accurate as of June 20, 2020  9:20 AM. If you have any questions, ask your nurse or doctor.        acetaminophen 500 MG tablet Commonly known as: TYLENOL Take 1,000 mg by mouth every 8 (eight) hours as needed for headache.   alprazolam 2 MG tablet Commonly known as: XANAX Take 2 mg by mouth 3 (three) times daily as needed for anxiety.   amLODipine 5 MG tablet Commonly known as: NORVASC Take 5 mg by mouth at bedtime.   empagliflozin 25 MG Tabs tablet Commonly known as: JARDIANCE Take 1 tablet by mouth daily. What changed: Another medication with the same name was removed. Continue taking this medication, and follow the directions you see here. Changed by: Dorita Sciara, MD   Fifty50 Glucose Meter 2.0 w/Device Kit Use to check blood sugar two times a day DX:E11.9   freestyle  lancets 1 Device by Misc.(Non-Drug; Combo Route) route daily. USED TO CHECK BLOOD SUGAR TID   FREESTYLE LITE test strip Generic drug: glucose blood USE 1 STRIP TWO TIMES A DAY, on insulin DX:E11.9   Global Easy Glide Pen Needles 32G X 4 MM Misc Generic drug: Insulin Pen Needle Use to inject insulin twice daily. What changed: Another medication with the same name was removed. Continue taking this medication, and follow the directions you see here. Changed by: Dorita Sciara, MD   insulin aspart protamine - aspart (70-30) 100 UNIT/ML FlexPen  Commonly known as: NOVOLOG 70/30 MIX INJECT 15 UNITS AT BREAKFAST AND 15 UNITS AT DINNER What changed: Another medication with the same name was removed. Continue taking this medication, and follow the directions you see here. Changed by: Dorita Sciara, MD   lidocaine 5 % ointment Commonly known as: XYLOCAINE Apply 1 application topically daily as needed for mild pain.   loratadine 10 MG tablet Commonly known as: CLARITIN Take by mouth.   multivitamin tablet Take 2 tablets by mouth daily. Gummies   naloxone 4 MG/0.1ML Liqd nasal spray kit Commonly known as: NARCAN Place 1 spray into the nose daily as needed (overdose).   oxyCODONE 15 MG immediate release tablet Commonly known as: ROXICODONE Take 15 mg by mouth 5 (five) times daily as needed.   pantoprazole 40 MG tablet Commonly known as: PROTONIX Take 1 tablet (40 mg total) by mouth daily.   triamcinolone 55 MCG/ACT Aero nasal inhaler Commonly known as: NASACORT Place 1 spray into the nose daily as needed (allergies).   VITAMIN B 12 PO Take by mouth daily. gummies        ALLERGIES: Allergies  Allergen Reactions  . Ibuprofen Other (See Comments)    Developed a ulcer  Other reaction(s): Other (See Comments) Developed a ulcer   . Prednisone Anaphylaxis, Shortness Of Breath and Palpitations  . Solu-Medrol [Methylprednisolone] Anaphylaxis, Shortness Of Breath and  Palpitations  . Bactrim [Sulfamethoxazole-Trimethoprim] Other (See Comments)    "blotchiness" redness, face swelling  . Biaxin [Clarithromycin] Swelling  . Naproxen Other (See Comments)    Developed a ulcer   . Sulfa Antibiotics Other (See Comments)    Other reaction(s): Angioedema Other reaction(s): Angioedema     REVIEW OF SYSTEMS: A comprehensive ROS was conducted with the patient and is negative except as per HPI    OBJECTIVE:   VITAL SIGNS: BP 126/86   Pulse 88   Ht 5' 3"  (1.6 m)   Wt 158 lb (71.7 kg)   SpO2 98%   BMI 27.99 kg/m    PHYSICAL EXAM:  General: Pt appears well and is in NAD  Neck: General: Supple without adenopathy or carotid bruits. Thyroid: Thyroid size normal.  No goiter or nodules appreciated. No thyroid bruit.  Lungs: Clear with good BS bilat with no rales, rhonchi, or wheezes  Heart: RRR with normal S1 and S2 and no gallops; no murmurs; no rub  Abdomen: Normoactive bowel sounds, soft, nontender, without masses or organomegaly palpable  Extremities:  Lower extremities - No pretibial edema. No lesions.  Skin: Normal texture and temperature to palpation. No rash noted. No Acanthosis nigricans/skin tags. No lipohypertrophy.  Neuro: MS is good with appropriate affect, pt is alert and Ox3     DATA REVIEWED:  Lab Results  Component Value Date   HGBA1C 6.4 (A) 06/20/2020   HGBA1C 8.1 07/24/2016   HGBA1C 7.3 (H) 01/08/2016   04/10/2020 BUN/Cr 26/1.2 GFR 50      SSMENT / PLAN / RECOMMENDATIONS:   1) Type 2 Diabetes Mellitus,Optimally controlled, With neuropathy complications - Most recent A1c of  6.4%. Goal A1c < 7.0 %.    Plan: GENERAL: I have discussed with the patient the pathophysiology of diabetes. We went over the natural progression of the disease. We talked about both insulin resistance and insulin deficiency. We stressed the importance of lifestyle changes including diet and exercise. I explained the complications associated with  diabetes including retinopathy, nephropathy, neuropathy as well as increased risk of cardiovascular disease. We went over  the benefit seen with glycemic control.    I explained to the patient that diabetic patients are at higher than normal risk for amputations.   Had freestyle libre but caused skin irritation   Intolerant to Victoza  Held insulin yesterday with a BG of 109 mg/dL, with recheck of 220 mg/dL post prandial, she used to   MEDICATIONS: - Continue Jardiance 25 mg 1 tablet daily  - Decrease Novolog 14 units with breakfast and supper     EDUCATION / INSTRUCTIONS:  BG monitoring instructions: Patient is instructed to check her blood sugars 2 times a day, before breakfast and supper   Call [facility] if: BG persistently < 70  . I reviewed the Rule of 15 for the treatment of hypoglycemia in detail with the patient. Literature supplied.   2) Diabetic complications:   Eye: Does not have known diabetic retinopathy.   Neuro/ Feet: Does  have known diabetic peripheral neuropathy.  Renal: Patient does not have known baseline CKD. She is not on an ACEI/ARB at present.   3) Lipids: Patient is has been off statins due to severe cramps. LDL 72 mg/dL . She declines statin therapy if needed in the future due to cramps     4) LOW GFR:  -She had a low GFR in 04/2020, she used to be on ACE/ARB but this was stopped due to low BP. I explained ot her that if her GFR remains below 60 , it will be recommended to start a small dose of an ACEi/ARB . She is open to this idea  F/U in 3 months  Signed electronically by: Mack Guise, MD  [facility]  [facility] [address] [place], [place] [address] Phone: [phone] FAX: [phone]   CC: Camillia Herter, NP [address] [address] Phone: [phone]  Fax: [phone]    Return to Endocrinology clinic as below: No future  appointments.

## 2020-07-17 DIAGNOSIS — Z79899 Other long term (current) drug therapy: Secondary | ICD-10-CM | POA: Diagnosis not present

## 2020-07-17 DIAGNOSIS — M545 Low back pain, unspecified: Secondary | ICD-10-CM | POA: Diagnosis not present

## 2020-07-17 DIAGNOSIS — B373 Candidiasis of vulva and vagina: Secondary | ICD-10-CM | POA: Diagnosis not present

## 2020-07-17 DIAGNOSIS — G8929 Other chronic pain: Secondary | ICD-10-CM | POA: Diagnosis not present

## 2020-07-17 DIAGNOSIS — G47429 Narcolepsy in conditions classified elsewhere without cataplexy: Secondary | ICD-10-CM | POA: Diagnosis not present

## 2020-07-27 DIAGNOSIS — F17210 Nicotine dependence, cigarettes, uncomplicated: Secondary | ICD-10-CM | POA: Diagnosis not present

## 2020-07-27 DIAGNOSIS — R0602 Shortness of breath: Secondary | ICD-10-CM | POA: Diagnosis not present

## 2020-07-27 DIAGNOSIS — R062 Wheezing: Secondary | ICD-10-CM | POA: Diagnosis not present

## 2020-07-27 DIAGNOSIS — Z6828 Body mass index (BMI) 28.0-28.9, adult: Secondary | ICD-10-CM | POA: Diagnosis not present

## 2020-07-27 DIAGNOSIS — Z008 Encounter for other general examination: Secondary | ICD-10-CM | POA: Diagnosis not present

## 2020-08-09 DIAGNOSIS — F411 Generalized anxiety disorder: Secondary | ICD-10-CM | POA: Diagnosis not present

## 2020-08-09 DIAGNOSIS — G8929 Other chronic pain: Secondary | ICD-10-CM | POA: Diagnosis not present

## 2020-08-09 DIAGNOSIS — F330 Major depressive disorder, recurrent, mild: Secondary | ICD-10-CM | POA: Diagnosis not present

## 2020-08-09 DIAGNOSIS — Z79899 Other long term (current) drug therapy: Secondary | ICD-10-CM | POA: Diagnosis not present

## 2020-08-14 DIAGNOSIS — M545 Low back pain, unspecified: Secondary | ICD-10-CM | POA: Diagnosis not present

## 2020-08-14 DIAGNOSIS — G8929 Other chronic pain: Secondary | ICD-10-CM | POA: Diagnosis not present

## 2020-08-14 DIAGNOSIS — M25512 Pain in left shoulder: Secondary | ICD-10-CM | POA: Diagnosis not present

## 2020-08-14 DIAGNOSIS — B373 Candidiasis of vulva and vagina: Secondary | ICD-10-CM | POA: Diagnosis not present

## 2020-08-14 DIAGNOSIS — Z79899 Other long term (current) drug therapy: Secondary | ICD-10-CM | POA: Diagnosis not present

## 2020-08-14 DIAGNOSIS — G47429 Narcolepsy in conditions classified elsewhere without cataplexy: Secondary | ICD-10-CM | POA: Diagnosis not present

## 2020-08-17 DIAGNOSIS — Z79899 Other long term (current) drug therapy: Secondary | ICD-10-CM | POA: Diagnosis not present

## 2020-09-19 ENCOUNTER — Ambulatory Visit: Payer: Medicare Other | Admitting: Internal Medicine

## 2020-10-03 DIAGNOSIS — F330 Major depressive disorder, recurrent, mild: Secondary | ICD-10-CM | POA: Diagnosis not present

## 2020-10-03 DIAGNOSIS — Z79899 Other long term (current) drug therapy: Secondary | ICD-10-CM | POA: Diagnosis not present

## 2020-10-03 DIAGNOSIS — F411 Generalized anxiety disorder: Secondary | ICD-10-CM | POA: Diagnosis not present

## 2020-10-03 DIAGNOSIS — G8929 Other chronic pain: Secondary | ICD-10-CM | POA: Diagnosis not present

## 2020-10-05 DIAGNOSIS — G47429 Narcolepsy in conditions classified elsewhere without cataplexy: Secondary | ICD-10-CM | POA: Diagnosis not present

## 2020-10-05 DIAGNOSIS — M545 Low back pain, unspecified: Secondary | ICD-10-CM | POA: Diagnosis not present

## 2020-10-05 DIAGNOSIS — M79644 Pain in right finger(s): Secondary | ICD-10-CM | POA: Diagnosis not present

## 2020-10-05 DIAGNOSIS — G8929 Other chronic pain: Secondary | ICD-10-CM | POA: Diagnosis not present

## 2020-10-05 DIAGNOSIS — Z79899 Other long term (current) drug therapy: Secondary | ICD-10-CM | POA: Diagnosis not present

## 2020-10-09 ENCOUNTER — Telehealth: Payer: Self-pay | Admitting: Interventional Cardiology

## 2020-10-09 NOTE — Telephone Encounter (Signed)
Pt c/o medication issue:  1. Name of Medication: amLODipine (NORVASC) 5 MG tablet  2. How are you currently taking this medication (dosage and times per day)? Patient is not taking  3. Are you having a reaction (difficulty breathing--STAT)?   4. What is your medication issue? Patient said she has not taken this medication since she saw Dr. Irish Lack in October. She wanted to let Dr. Irish Lack and his Nurse know

## 2020-10-09 NOTE — Telephone Encounter (Signed)
Left message for patient to call back  

## 2020-10-09 NOTE — Telephone Encounter (Signed)
Pt returning phone call... please advise  

## 2020-10-10 NOTE — Telephone Encounter (Signed)
I spoke with patient.  She stopped amlodipine several months ago due to low BP.  Since stopping BP has been running 120-130/60-70.  I told her these readings were OK and it would be noted she is no longer taking amlodipine.  Patient will call if BP increases.

## 2020-10-17 ENCOUNTER — Encounter: Payer: Self-pay | Admitting: Orthopaedic Surgery

## 2020-10-17 ENCOUNTER — Other Ambulatory Visit: Payer: Self-pay

## 2020-10-17 ENCOUNTER — Ambulatory Visit (INDEPENDENT_AMBULATORY_CARE_PROVIDER_SITE_OTHER): Payer: Medicare Other | Admitting: Orthopaedic Surgery

## 2020-10-17 DIAGNOSIS — G8929 Other chronic pain: Secondary | ICD-10-CM | POA: Diagnosis not present

## 2020-10-17 DIAGNOSIS — M25512 Pain in left shoulder: Secondary | ICD-10-CM | POA: Diagnosis not present

## 2020-10-17 NOTE — Progress Notes (Signed)
Office Visit Note   Patient: Courtney [name]           Date of Birth: 09-09-1955           MRN: [id number] Visit Date: 10/17/2020              Requested by: Camillia Herter, NP [place],  [place] [address] PCP: Camillia Herter, NP   Assessment & Plan: Visit Diagnoses:  1. Chronic left shoulder pain     Plan: Ms. Balinda Quails experienced insidious onset of recurrent left shoulder pain within the past several months.  I saw her in February and injected the subacromial space and she notes that it "helps some".  She has had a prior history of adhesive capsulitis with manipulation several years ago.  She was in pain management and has been taking oxycodone for her fibromyalgia and she received a cortisone injection within the last several months in her left shoulder which she notes did not help.  She is experiencing pain anteriorly posteriorly along the biceps tendon.  I am going to order an MRI scan as she could have some biceps pathology and possibly a rotator cuff tear as she has had a poor response to treatment to this point  Follow-Up Instructions: Return After MRI scan left shoulder.   Orders:  Orders Placed This Encounter  Procedures   MR Shoulder Left w/o contrast   No orders of the defined types were placed in this encounter.     Procedures: No procedures performed   Clinical Data: No additional findings.   Subjective: Chief Complaint  Patient presents with   Left Shoulder - Pain  Recurrent left shoulder pain the point of compromise.  She is in pain management and takes oxycodone as needed.  I saw her earlier in the year for left shoulder pain and with a subacromial cortisone injection that "helped some".  She has had an exacerbation of her pain possibly after a fall in June and was seen in her pain management clinic with a cortisone injection in the left shoulder which she notes did not help.  She is having some difficulty with overhead motion and  pain along the anterior lateral aspect of her shoulder.  Not having any numbness or tingling  HPI  Review of Systems   Objective: Vital Signs: There were no vitals taken for this visit.  Physical Exam Constitutional:      Appearance: She is well-developed.  Pulmonary:     Effort: Pulmonary effort is normal.  Skin:    General: Skin is warm and dry.  Neurological:     Mental Status: She is alert and oriented to person, place, and time.  Psychiatric:        Behavior: Behavior normal.    Ortho Exam left shoulder lacks just a few degrees to full overhead motion but is painful circuitous arc of motion.  Pain with abduction as well in the anterior lateral subacromial region.  Multiple areas of tenderness possibly consistent with her fibromyalgia but did have a positive Speed sign.  No crepitation.  Positive impingement.  Specialty Comments:  No specialty comments available.  Imaging: No results found.   PMFS History: Patient Active Problem List   Diagnosis Date Noted   Type 2 diabetes mellitus with diabetic polyneuropathy, with long-term current use of insulin ([place]) 06/20/2020   Pain in left shoulder 04/04/2020   Pain in left wrist 06/01/2019   Fracture of distal phalanx of right  ring finger 03/15/2019   Facet arthritis, degenerative, 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   Polyp of transverse colon    Special screening for malignant neoplasms, colon    History of colonic polyps    Polyp of sigmoid colon    Cervicalgia 08/10/2018   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   Loss of weight    RLQ abdominal pain 03/27/2017   Bloating 03/27/2017   Abnormal finding on imaging 03/27/2017   Dilation of biliary tract 03/27/2017   Nausea 03/27/2017   Multiple joint pain 03/31/2016   Essential tremor 03/31/2016   Numbness in both hands 03/18/2016   Hypersomnia 11/09/2015   Diabetes mellitus ([place]) 07/18/2015   Essential hypertension 07/18/2015   Abnormal ECG 07/18/2015   Gait  difficulty 02/09/2015   Chronic left shoulder pain 02/09/2015   Depression with anxiety 02/09/2015   Numbness 02/09/2015   Insomnia 02/09/2015   Urinary hesitancy 02/09/2015   Other fatigue 02/09/2015   Past Medical History:  Diagnosis Date   Abnormal ECG 07/18/2015   Arthritis    Asthma    Bowel obstruction ([facility])    Depression with anxiety 02/09/2015   mild xanax prn   Diabetes mellitus ([facility]) 07/18/2015   type 2   Diverticulitis    Dysrhythmia    abnormal ekgs worked up   Essential tremor 03/31/2016   Fibromyalgia    Gait difficulty 02/09/2015   Gallstones    GERD (gastroesophageal reflux disease)    HLD (hyperlipidemia)    Hypersomnia 11/09/2015   Hypertension    no meds   Hyperthyroidism    patient states has no problems at present-01/25/2019   Hypothyroidism    patient states no problems at present 01/25/2019   IBS (irritable bowel syndrome)    Insomnia 02/09/2015   Migraines    MS (multiple sclerosis) ([facility])    Multiple joint pain 03/31/2016   Numbness in both hands 03/18/2016   Other fatigue 02/09/2015   Peptic ulcer    Pneumonia    PONV (postoperative nausea and vomiting)    Urinary hesitancy 02/09/2015   Vision abnormalities     Family History  Problem Relation Age of Onset   Heart disease Mother    Kidney disease Mother    Diabetes Mother    Other Father 37       murdered   Multiple sclerosis Daughter    Multiple sclerosis Other    Diabetes Sister    Alcohol abuse Brother        ETOH and marijuana   Diabetes Brother    Diabetes Sister    Diabetes Brother    ALS Brother     Past Surgical History:  Procedure Laterality Date   ABDOMINAL HYSTERECTOMY  1976   including oophorectomy, due to uterine cancer   APPENDECTOMY  1974   BIOPSY  01/31/2019   Procedure: BIOPSY;  Surgeon: Mauri Pole, MD;  [facility]: [facility];  Service: Endoscopy;;   CARPAL TUNNEL RELEASE Left    carpal tunnel surgery   CHOLECYSTECTOMY     2006   COLON SURGERY     SBO  x 2   COLONOSCOPY WITH PROPOFOL N/A 01/31/2019   Procedure: COLONOSCOPY WITH PROPOFOL;  Surgeon: Mauri Pole, MD;  [facility];  Service: Endoscopy;  Laterality: N/A;   EUS N/A 04/09/2017   Procedure: UPPER ENDOSCOPIC ULTRASOUND (EUS) LINEAR;  Surgeon: Milus Banister, MD;  [facility];  Service: Endoscopy;  Laterality: N/A;   HERNIA REPAIR  MENISCUS REPAIR Left    POLYPECTOMY  01/31/2019   Procedure: POLYPECTOMY;  Surgeon: Mauri Pole, MD;  [facility];  Service: Endoscopy;;   SMALL INTESTINE SURGERY     toenail removal Right 12/2017   great toenail    Social History   Occupational History   Occupation: disabled  Tobacco Use   Smoking status: Former    Packs/day: 0.25    Years: 5.00    Pack years: 1.25    Types: Cigarettes    Quit date: 1990    Years since quitting: 32.6   Smokeless tobacco: Never  Vaping Use   Vaping Use: Never used  Substance and Sexual Activity   Alcohol use: Yes    Comment: rarely   Drug use: No   Sexual activity: Yes     Garald Balding, MD   Note - This record has been created using Editor, commissioning.  Chart creation errors have been sought, but may not always  have been located. Such creation errors do not reflect on  the standard of medical care.

## 2020-10-19 ENCOUNTER — Encounter: Payer: Self-pay | Admitting: Internal Medicine

## 2020-10-19 ENCOUNTER — Other Ambulatory Visit: Payer: Self-pay

## 2020-10-19 ENCOUNTER — Telehealth: Payer: Self-pay | Admitting: Internal Medicine

## 2020-10-19 ENCOUNTER — Ambulatory Visit (INDEPENDENT_AMBULATORY_CARE_PROVIDER_SITE_OTHER): Payer: Medicare Other | Admitting: Internal Medicine

## 2020-10-19 VITALS — BP 112/68 | HR 71 | Ht 65.0 in | Wt 157.6 lb

## 2020-10-19 DIAGNOSIS — Z794 Long term (current) use of insulin: Secondary | ICD-10-CM | POA: Diagnosis not present

## 2020-10-19 DIAGNOSIS — E1122 Type 2 diabetes mellitus with diabetic chronic kidney disease: Secondary | ICD-10-CM | POA: Diagnosis not present

## 2020-10-19 DIAGNOSIS — E1142 Type 2 diabetes mellitus with diabetic polyneuropathy: Secondary | ICD-10-CM

## 2020-10-19 DIAGNOSIS — N1831 Chronic kidney disease, stage 3a: Secondary | ICD-10-CM | POA: Diagnosis not present

## 2020-10-19 LAB — BASIC METABOLIC PANEL
BUN: 21 mg/dL (ref 6–23)
CO2: 26 mEq/L (ref 19–32)
Calcium: 9.7 mg/dL (ref 8.4–10.5)
Chloride: 102 mEq/L (ref 96–112)
Creatinine, Ser: 1.14 mg/dL (ref 0.40–1.20)
GFR: 50.39 mL/min — ABNORMAL LOW (ref >60.00)
Glucose, Bld: 114 mg/dL — ABNORMAL HIGH (ref 70–99)
Potassium: 4.1 mEq/L (ref 3.5–5.1)
Sodium: 138 mEq/L (ref 135–145)

## 2020-10-19 LAB — POCT GLYCOSYLATED HEMOGLOBIN (HGB A1C): Hemoglobin A1C: 6.8 % — AB (ref 4.0–5.6)

## 2020-10-19 LAB — MICROALBUMIN / CREATININE URINE RATIO
Creatinine,U: 97.9 mg/dL
Microalb Creat Ratio: 0.7 mg/g (ref 0.0–30.0)
Microalb, Ur: <0.7 mg/dL (ref 0.0–1.9)

## 2020-10-19 MED ORDER — INSULIN PEN NEEDLE 32G X 4 MM MISC: 1.0000 | Freq: Four times a day (QID) | 3 refills | Status: DC

## 2020-10-19 MED ORDER — DEXCOM G6 SENSOR MISC: 1.0000 | 3 refills | Status: DC

## 2020-10-19 MED ORDER — INSULIN ASPART PROT & ASPART (70-30 MIX) 100 UNIT/ML PEN: PEN_INJECTOR | SUBCUTANEOUS | 3 refills | Status: DC

## 2020-10-19 MED ORDER — DEXCOM G6 TRANSMITTER MISC: 1.0000 | 3 refills | Status: DC

## 2020-10-19 NOTE — Telephone Encounter (Signed)
Patient called to request that her Novolog 70/30 and pen needles be sent to [facility] not [facility] and that it has to be for 90 days.  Patient also states that she now does not want the Dexcom G6 CGM system.  She now would like to try the Free Style CGM system.  Call back # [phone]

## 2020-10-19 NOTE — Patient Instructions (Addendum)
-   Continue Jardiance 25 mg 1 tablet daily  - Continue  Novolog Mix 18 units with breakfast and 20 units with supper    - Let me know when you receive the dexcom so I can refer you to our educator    HOW TO TREAT LOW BLOOD SUGARS (Blood sugar LESS THAN 70 MG/DL) Please follow the RULE OF 15 for the treatment of hypoglycemia treatment (when your (blood sugars are less than 70 mg/dL)   STEP 1: Take 15 grams of carbohydrates when your blood sugar is low, which includes:  3-4 GLUCOSE TABS  OR 3-4 OZ OF JUICE OR REGULAR SODA OR ONE TUBE OF GLUCOSE GEL    STEP 2: RECHECK blood sugar in 15 MINUTES STEP 3: If your blood sugar is still low at the 15 minute recheck --> then, go back to STEP 1 and treat AGAIN with another 15 grams of carbohydrates.

## 2020-10-19 NOTE — Progress Notes (Signed)
Name: Courtney [name]  Age/ Sex: 65 y.o., female   MRN/ DOB: [id number], 1955-09-18     PCP: Camillia Herter, NP   Reason for Endocrinology Evaluation: Type 2 Diabetes Mellitus  Initial Endocrine Consultative Visit: 06/20/2020    PATIENT IDENTIFIER: Courtney [name] is a 65 y.o. female with a past medical history of T2DM, MS , HTN and essential tremors. The patient has followed with Endocrinology clinic since 06/20/20 for consultative assistance with management of her diabetes.  DIABETIC HISTORY:  Courtney [name] was diagnosed with DM in 2008, Victoza caused nausea. Has been on insulin since 2011. Her hemoglobin A1c has ranged from 7.3% in 2017, peaking at 8.0% in years ago.   On her initial visit to our clinic she had an A1c of 6.4% , she was on Jardiance and novolog mix . She was having recurrent hypoglycemia at the time, we reduced insulin and continued Jardiance   SUBJECTIVE:   During the last visit (06/20/2020): A1c 6.4% we reduced insulin mix and continued Jardiance       Today (10/19/2020): Courtney [name] is here for a follow up on diabetes management.  She checks her blood sugars occasionally  times daily. The patient has had hypoglycemic episodes since the last clinic visit but has reduced in frequency    Has been on vacation for 6 weeks which interrupted self diabetes care  Has tried freestyle libre with skin irritation  Has occasional pain in the abdomen, has been injecting insulin at thigh    HOME DIABETES REGIMEN:  Jardiance 25 mg 1 tablet daily  Novolog Mix 14 units with breakfast and supper - using 18 units QAM and around 20 QPM      Statin: no ACE-I/ARB: no Prior Diabetic Education: no   METER DOWNLOAD SUMMARY: Did not bring       DIABETIC COMPLICATIONS: Microvascular complications:  Neuropathy Denies: retinopathy, CKD Last Eye Exam: Completed   Macrovascular complications:   Denies: CAD, CVA, PVD   HISTORY:  Past Medical  History:  Past Medical History:  Diagnosis Date   Abnormal ECG 07/18/2015   Arthritis    Asthma    Bowel obstruction ([place])    Depression with anxiety 02/09/2015   mild xanax prn   Diabetes mellitus ([place]) 07/18/2015   type 2   Diverticulitis    Dysrhythmia    abnormal ekgs worked up   Essential tremor 03/31/2016   Fibromyalgia    Gait difficulty 02/09/2015   Gallstones    GERD (gastroesophageal reflux disease)    HLD (hyperlipidemia)    Hypersomnia 11/09/2015   Hypertension    no meds   Hyperthyroidism    patient states has no problems at present-01/25/2019   Hypothyroidism    patient states no problems at present 01/25/2019   IBS (irritable bowel syndrome)    Insomnia 02/09/2015   Migraines    MS (multiple sclerosis) ([place])    Multiple joint pain 03/31/2016   Numbness in both hands 03/18/2016   Other fatigue 02/09/2015   Peptic ulcer    Pneumonia    PONV (postoperative nausea and vomiting)    Urinary hesitancy 02/09/2015   Vision abnormalities    Past Surgical History:  Past Surgical History:  Procedure Laterality Date   ABDOMINAL HYSTERECTOMY  1976   including oophorectomy, due to uterine cancer   APPENDECTOMY  1974   BIOPSY  01/31/2019   Procedure: BIOPSY;  Surgeon: Mauri Pole, MD;  [facility];  Service: Endoscopy;;   CARPAL TUNNEL RELEASE Left    carpal tunnel surgery   CHOLECYSTECTOMY     2006   COLON SURGERY     SBO x 2   COLONOSCOPY WITH PROPOFOL N/A 01/31/2019   Procedure: COLONOSCOPY WITH PROPOFOL;  Surgeon: Mauri Pole, MD;  [facility];  Service: Endoscopy;  Laterality: N/A;   EUS N/A 04/09/2017   Procedure: UPPER ENDOSCOPIC ULTRASOUND (EUS) LINEAR;  Surgeon: Milus Banister, MD;  [facility];  Service: Endoscopy;  Laterality: N/A;   HERNIA REPAIR     MENISCUS REPAIR Left    POLYPECTOMY  01/31/2019   Procedure: POLYPECTOMY;  Surgeon: Mauri Pole, MD;  [facility];  Service: Endoscopy;;    SMALL INTESTINE SURGERY     toenail removal Right 12/2017   great toenail    Social History:  reports that she quit smoking about 32 years ago. Her smoking use included cigarettes. She has a 1.25 pack-year smoking history. She has never used smokeless tobacco. She reports current alcohol use. She reports that she does not use drugs. Family History:  Family History  Problem Relation Age of Onset   Heart disease Mother    Kidney disease Mother    Diabetes Mother    Other Father 82       murdered   Multiple sclerosis Daughter    Multiple sclerosis Other    Diabetes Sister    Alcohol abuse Brother        ETOH and marijuana   Diabetes Brother    Diabetes Sister    Diabetes Brother    ALS Brother      HOME MEDICATIONS: Allergies as of 10/19/2020       Reactions   Ibuprofen Other (See Comments)   Developed a ulcer  Other reaction(s): Other (See Comments) Developed a ulcer    Prednisone Anaphylaxis, Shortness Of Breath, Palpitations   Solu-medrol [methylprednisolone] Anaphylaxis, Shortness Of Breath, Palpitations   Bactrim [sulfamethoxazole-trimethoprim] Other (See Comments)   "blotchiness" redness, face swelling   Biaxin [clarithromycin] Swelling   Naproxen Other (See Comments)   Developed a ulcer    Sulfa Antibiotics Other (See Comments)   Other reaction(s): Angioedema Other reaction(s): Angioedema        Medication List        Accurate as of October 19, 2020  7:12 AM. If you have any questions, ask your nurse or doctor.          acetaminophen 500 MG tablet Commonly known as: TYLENOL Take 1,000 mg by mouth every 8 (eight) hours as needed for headache.   alprazolam 2 MG tablet Commonly known as: XANAX Take 2 mg by mouth 3 (three) times daily as needed for anxiety.   empagliflozin 25 MG Tabs tablet Commonly known as: JARDIANCE Take 1 tablet (25 mg total) by mouth daily.   Fifty50 Glucose Meter 2.0 w/Device Kit Use to check blood sugar two times a day  DX:E11.9   freestyle lancets 1 Device by Misc.(Non-Drug; Combo Route) route daily. USED TO CHECK BLOOD SUGAR TID   FREESTYLE LITE test strip Generic drug: glucose blood USE 1 STRIP TWO TIMES A DAY, on insulin DX:E11.9   Global Easy Glide Pen Needles 32G X 4 MM Misc Generic drug: Insulin Pen Needle Inject 1 Device into the skin 2 (two) times daily.   insulin aspart protamine - aspart (70-30) 100 UNIT/ML FlexPen Commonly known as: NOVOLOG 70/30 MIX Inject 0.14 mLs (14 Units total) into the skin 2 (  two) times daily with a meal.   lidocaine 5 % ointment Commonly known as: XYLOCAINE Apply 1 application topically daily as needed for mild pain.   loratadine 10 MG tablet Commonly known as: CLARITIN Take by mouth.   multivitamin tablet Take 2 tablets by mouth daily. Gummies   naloxone 4 MG/0.1ML Liqd nasal spray kit Commonly known as: NARCAN Place 1 spray into the nose daily as needed (overdose).   oxyCODONE 15 MG immediate release tablet Commonly known as: ROXICODONE Take 15 mg by mouth 5 (five) times daily as needed.   pantoprazole 40 MG tablet Commonly known as: PROTONIX Take 1 tablet (40 mg total) by mouth daily.   triamcinolone 55 MCG/ACT Aero nasal inhaler Commonly known as: NASACORT Place 1 spray into the nose daily as needed (allergies).   VITAMIN B 12 PO Take by mouth daily. gummies         OBJECTIVE:   Vital Signs: BP 112/68   Pulse 71   Ht 5' 5" (1.651 m)   Wt 157 lb 9.6 oz (71.5 kg)   SpO2 96%   BMI 26.23 kg/m   Wt Readings from Last 3 Encounters:  06/20/20 158 lb (71.7 kg)  04/04/20 153 lb (69.4 kg)  03/29/20 155 lb (70.3 kg)     Exam: General: Pt appears well and is in NAD  Lungs: Clear with good BS bilat with no rales, rhonchi, or wheezes  Heart: RRR with normal S1 and S2 and no gallops; no murmurs; no rub  Abdomen: Normoactive bowel sounds, soft, nontender, without masses or organomegaly palpable  Extremities: No pretibial edema.    Neuro: MS is good with appropriate affect, pt is alert and Ox3      DATA REVIEWED:  Lab Results  Component Value Date   HGBA1C 6.4 (A) 06/20/2020   HGBA1C 8.1 07/24/2016   HGBA1C 7.3 (H) 01/08/2016    Results for Courtney [name], Courtney [name] (MRN [id number]) as of 10/21/2020 13:22  Ref. Range 10/19/2020 09:36  Sodium Latest Ref Range: 135 - 145 mEq/L 138  Potassium Latest Ref Range: 3.5 - 5.1 mEq/L 4.1  Chloride Latest Ref Range: 96 - 112 mEq/L 102  CO2 Latest Ref Range: 19 - 32 mEq/L 26  Glucose Latest Ref Range: 70 - 99 mg/dL 114 (H)  BUN Latest Ref Range: 6 - 23 mg/dL 21  Creatinine Latest Ref Range: 0.40 - 1.20 mg/dL 1.14  Calcium Latest Ref Range: 8.4 - 10.5 mg/dL 9.7  GFR Latest Ref Range: >60.00 mL/min 50.39 (L)  MICROALB/CREAT RATIO Latest Ref Range: 0.0 - 30.0 mg/g 0.7  Creatinine,U Latest Units: mg/dL 97.9  Microalb, Ur Latest Ref Range: 0.0 - 1.9 mg/dL <0.7    ASSESSMENT / PLAN / RECOMMENDATIONS:   1) Type 2 Diabetes Mellitus, Optimally controlled, With neuropathic complications - Most recent A1c of 6.8 %. Goal A1c < 7.0 %.    - A1c continues at goal, she had some interruption of diabetes self care while on vacation  - No changes at this time  - BMP shows lower GFR then usual but normal Ma/Cr ratio  - Will prescribe dexcom    MEDICATIONS: Continue Jardiance 25 mg 1 tablet daily  Continue  Novolog 18 units with breakfast and 20 units with  supper     EDUCATION / INSTRUCTIONS: BG monitoring instructions: Patient is instructed to check her blood sugars 2 times a day, breakfast and supper. Call [facility] if: BG persistently < 70  I reviewed the Rule of  15 for the treatment of hypoglycemia in detail with the patient. Literature supplied.   2) Diabetic complications:  Eye: Does not have known diabetic retinopathy.  Neuro/ Feet: Does  have known diabetic peripheral neuropathy .  Renal: Patient does not have known baseline CKD. She   is not on an  ACEI/ARB at present.        3) Lipids: Patient is has been off statins due to severe cramps. LDL 72 mg/dL . She declines statin therapy if needed in the future due to cramps        4) LOW GFR:   -She had a low GFR in 04/2020, she used to be on ACE/ARB but this was stopped due to low BP. We had discussed in the past that if her GFR remains below 60 , it will be recommended to start a small dose of an ACEi/ARB  - Will start lisinopril as below    Medication  Start Lisinopril 2.5 mg daily     F/U in 3 months     Signed electronically by: Mack Guise, MD  [facility]  [facility] [address] [place], [place] [address] Phone: [phone] FAX: [phone]   CC: Camillia Herter, NP [place] [address] Phone: [phone]  Fax: [phone]  Return to Endocrinology clinic as below: Future Appointments  Date Time Provider [place]  10/19/2020  8:50 AM Shamleffer, Melanie Crazier, MD [facility] None  03/12/2021  2:00 PM Jettie Booze, MD [facility]

## 2020-10-21 ENCOUNTER — Encounter: Payer: Self-pay | Admitting: Internal Medicine

## 2020-10-21 DIAGNOSIS — E119 Type 2 diabetes mellitus without complications: Secondary | ICD-10-CM | POA: Insufficient documentation

## 2020-10-21 DIAGNOSIS — N1831 Chronic kidney disease, stage 3a: Secondary | ICD-10-CM | POA: Insufficient documentation

## 2020-10-21 DIAGNOSIS — Z794 Long term (current) use of insulin: Secondary | ICD-10-CM

## 2020-10-21 MED ORDER — EMPAGLIFLOZIN 25 MG PO TABS: 25.0000 mg | ORAL_TABLET | Freq: Every day | ORAL | 3 refills | Status: DC

## 2020-10-21 MED ORDER — LISINOPRIL 2.5 MG PO TABS: 2.5000 mg | ORAL_TABLET | Freq: Every day | ORAL | 3 refills | Status: DC

## 2020-10-22 ENCOUNTER — Other Ambulatory Visit: Payer: Self-pay

## 2020-10-22 ENCOUNTER — Ambulatory Visit
Admission: RE | Admit: 2020-10-22 | Discharge: 2020-10-22 | Disposition: A | Discharge status: Home / Self Care | Payer: Medicare Other | Source: Ambulatory Visit | Attending: Orthopaedic Surgery | Admitting: Orthopaedic Surgery

## 2020-10-22 DIAGNOSIS — M75102 Unspecified rotator cuff tear or rupture of left shoulder, not specified as traumatic: Secondary | ICD-10-CM | POA: Diagnosis not present

## 2020-10-22 DIAGNOSIS — M19012 Primary osteoarthritis, left shoulder: Secondary | ICD-10-CM | POA: Diagnosis not present

## 2020-10-22 DIAGNOSIS — G8929 Other chronic pain: Secondary | ICD-10-CM

## 2020-10-22 DIAGNOSIS — M25512 Pain in left shoulder: Secondary | ICD-10-CM

## 2020-10-22 MED ORDER — FREESTYLE LIBRE 2 SENSOR MISC: 1.0000 | 11 refills | Status: DC

## 2020-10-22 MED ORDER — INSULIN ASPART PROT & ASPART (70-30 MIX) 100 UNIT/ML PEN: PEN_INJECTOR | SUBCUTANEOUS | 3 refills | Status: DC

## 2020-10-22 NOTE — Telephone Encounter (Signed)
Attempted to contact the pt over the phone but no answer , left a message and a portal message has been sent    Freestyle libre sent   If she is concerned about jardiance causing low GFR, she was given the option to hold it and recheck BMP in 4 weeks     Whigham, MD  [facility] Endocrinology  [facility] [address] [place], [place] [address] Phone: [phone] FAX: [phone]

## 2020-10-22 NOTE — Telephone Encounter (Signed)
Novolog as been sent to the correct pharmacy for patient, pt is want switch Dexcom  to Freestyle  pt is understood that it depends on insurance , please advise

## 2020-10-24 ENCOUNTER — Other Ambulatory Visit: Payer: Self-pay

## 2020-10-24 ENCOUNTER — Encounter: Payer: Self-pay | Admitting: Orthopaedic Surgery

## 2020-10-25 ENCOUNTER — Other Ambulatory Visit: Payer: Self-pay

## 2020-10-25 DIAGNOSIS — G8929 Other chronic pain: Secondary | ICD-10-CM

## 2020-10-25 NOTE — Telephone Encounter (Signed)
Called results of MRI scan-please arrange PT here for ROM and strengthening exercises-has early adhesive capsulitis and rotator cuff tendonitis-hx fibromalgia

## 2020-10-25 NOTE — Telephone Encounter (Signed)
Order made

## 2020-11-02 ENCOUNTER — Other Ambulatory Visit: Payer: Self-pay | Admitting: Internal Medicine

## 2020-11-02 MED ORDER — FREESTYLE LIBRE 2 SENSOR MISC: 1.0000 | 3 refills | Status: DC

## 2020-11-06 DIAGNOSIS — G8929 Other chronic pain: Secondary | ICD-10-CM | POA: Diagnosis not present

## 2020-11-06 DIAGNOSIS — M545 Low back pain, unspecified: Secondary | ICD-10-CM | POA: Diagnosis not present

## 2020-11-06 DIAGNOSIS — E119 Type 2 diabetes mellitus without complications: Secondary | ICD-10-CM | POA: Diagnosis not present

## 2020-11-06 DIAGNOSIS — G252 Other specified forms of tremor: Secondary | ICD-10-CM | POA: Diagnosis not present

## 2020-11-06 DIAGNOSIS — Z79899 Other long term (current) drug therapy: Secondary | ICD-10-CM | POA: Diagnosis not present

## 2020-11-08 DIAGNOSIS — Z1231 Encounter for screening mammogram for malignant neoplasm of breast: Secondary | ICD-10-CM | POA: Diagnosis not present

## 2020-11-13 ENCOUNTER — Ambulatory Visit (INDEPENDENT_AMBULATORY_CARE_PROVIDER_SITE_OTHER): Payer: Medicare Other | Admitting: Physical Therapy

## 2020-11-13 ENCOUNTER — Other Ambulatory Visit: Payer: Self-pay

## 2020-11-13 ENCOUNTER — Encounter: Payer: Self-pay | Admitting: Physical Therapy

## 2020-11-13 DIAGNOSIS — M25612 Stiffness of left shoulder, not elsewhere classified: Secondary | ICD-10-CM | POA: Diagnosis not present

## 2020-11-13 DIAGNOSIS — M6281 Muscle weakness (generalized): Secondary | ICD-10-CM | POA: Diagnosis not present

## 2020-11-13 DIAGNOSIS — M25512 Pain in left shoulder: Secondary | ICD-10-CM

## 2020-11-13 DIAGNOSIS — G8929 Other chronic pain: Secondary | ICD-10-CM

## 2020-11-13 NOTE — Therapy (Addendum)
[facility] Physical Therapy [address] [place], [place], [address] Phone: [phone]   Fax:  [phone]  Physical Therapy Evaluation /Discharge  Patient Details  Name: Jeralynn Vaquera MRN: [id number] Date of Birth: [birth year] Referring Provider (PT): Durward Fortes   Encounter Date: 11/13/2020   PT End of Session - 11/13/20 1119     Visit Number 1    Number of Visits 15    Date for PT Re-Evaluation 02/05/21    Authorization Type MCR,tricare    PT Start Time 1019    PT Stop Time 1105    PT Time Calculation (min) 46 min    Activity Tolerance Patient limited by pain    Behavior During Therapy [facility] for tasks assessed/performed             Past Medical History:  Diagnosis Date   Abnormal ECG 07/18/2015   Arthritis    Asthma    Bowel obstruction ([place])    Depression with anxiety 02/09/2015   mild xanax prn   Diabetes mellitus ([place]) 07/18/2015   type 2   Diverticulitis    Dysrhythmia    abnormal ekgs worked up   Essential tremor 03/31/2016   Fibromyalgia    Gait difficulty 02/09/2015   Gallstones    GERD (gastroesophageal reflux disease)    HLD (hyperlipidemia)    Hypersomnia 11/09/2015   Hypertension    no meds   Hyperthyroidism    patient states has no problems at present-01/25/2019   Hypothyroidism    patient states no problems at present 01/25/2019   IBS (irritable bowel syndrome)    Insomnia 02/09/2015   Migraines    MS (multiple sclerosis) ([place])    Multiple joint pain 03/31/2016   Numbness in both hands 03/18/2016   Other fatigue 02/09/2015   Peptic ulcer    Pneumonia    PONV (postoperative nausea and vomiting)    Urinary hesitancy 02/09/2015   Vision abnormalities     Past Surgical History:  Procedure Laterality Date   ABDOMINAL HYSTERECTOMY  1976   including oophorectomy, due to uterine cancer   [place]  01/31/2019   Procedure: BIOPSY;  Surgeon: Mauri Pole, MD;  [facility];  Service:  Endoscopy;;   CARPAL TUNNEL RELEASE Left    carpal tunnel surgery   CHOLECYSTECTOMY     2006   COLON SURGERY     SBO x 2   COLONOSCOPY WITH PROPOFOL N/A 01/31/2019   Procedure: COLONOSCOPY WITH PROPOFOL;  Surgeon: Mauri Pole, MD;  [facility];  Service: Endoscopy;  Laterality: N/A;   EUS N/A 04/09/2017   Procedure: UPPER ENDOSCOPIC ULTRASOUND (EUS) LINEAR;  Surgeon: Milus Banister, MD;  [facility];  Service: Endoscopy;  Laterality: N/A;   HERNIA REPAIR     MENISCUS REPAIR Left    POLYPECTOMY  01/31/2019   Procedure: POLYPECTOMY;  Surgeon: Mauri Pole, MD;  [facility];  Service: Endoscopy;;   SMALL INTESTINE SURGERY     toenail removal Right 12/2017   great toenail     There were no vitals filed for this visit.    Subjective Assessment - 11/13/20 1025     Subjective She relays chronic Lt shoulder pain for a long time. She has constant pain with almost all activity. She has had injeciton before and has history of  shoulder manipulation years ago due to adhesive capsulitis. She uses heat and ice at home, uses bands and weights, and does shoulder  stretches    Pertinent History PMH: fibromyalgia,DM,MS,HTN    Diagnostic tests MRI, she chart for details    Currently in Pain? Yes    Pain Score 6     Pain Location Shoulder    Pain Orientation Left    Pain Descriptors / Indicators Aching;Shooting;Sharp    Pain Type Chronic pain    Pain Radiating Towards down her left arm midway    Pain Onset More than a month ago    Pain Frequency Constant    Aggravating Factors  any activity    Pain Relieving Factors pain meds, heat,ice, exercises                [facility] PT Assessment - 11/13/20 0001       Assessment   Medical Diagnosis Chronic Lt shoulder pain, early adhesive capsulitis and RTC and biceps tendonitits, supraspinatus partial-thickness bursal surface tear and insertional interstitial tear confirmed on MR    Referring Provider  (PT) [place]    Onset Date/Surgical Date --   chronic pain for many years   Next MD Visit PRN    Prior Therapy nothing recent      Precautions   Precautions None      Balance Screen   Has the patient fallen in the past 6 months Yes    How many times? 1    Has the patient had a decrease in activity level because of a fear of falling?  No    Is the patient reluctant to leave their home because of a fear of falling?  No      Home Ecologist residence      Prior Function   Level of Independence Independent    Vocation Unemployed    Leisure reading, walk,arts and crafts      Cognition   Overall Cognitive Status Within Functional Limits for tasks assessed      Observation/Other Assessments   Focus on Therapeutic Outcomes (FOTO)  61% functional, predicted change 0 (61%)      ROM / Strength   AROM / PROM / Strength AROM;PROM;Strength      AROM   Overall AROM Comments limited by pain    AROM Assessment Site Shoulder    Right/Left Shoulder Left    Left Shoulder Flexion 100 Degrees    Left Shoulder ABduction 100 Degrees    Left Shoulder Internal Rotation --   L4 reaching behind back   Left Shoulder External Rotation --   occiput reaching behind head     PROM   Overall PROM Comments empty end feel, limited by pain but I do sense some GH hypomobility    PROM Assessment Site Shoulder    Right/Left Shoulder Left    Left Shoulder Flexion 150 Degrees    Left Shoulder ABduction 120 Degrees    Left Shoulder Internal Rotation 50 Degrees    Left Shoulder External Rotation 60 Degrees      Strength   Overall Strength Comments grip strength 4/5 bilat    Strength Assessment Site Shoulder;Elbow    Right/Left Shoulder Left    Left Shoulder Flexion 3+/5    Left Shoulder ABduction 3+/5    Left Shoulder Internal Rotation 4/5    Left Shoulder External Rotation 4/5    Right/Left Elbow Left    Left Elbow Flexion 3+/5    Left Elbow Extension 3+/5  Objective measurements completed on examination: See above findings.       [facility] Adult PT Treatment/Exercise - 11/13/20 0001       Modalities   Modalities Iontophoresis      Iontophoresis   Type of Iontophoresis Dexamethasone    Location Lt shoulder    Dose 1.0 CC    Time 4-6 hour wear home patch      Manual Therapy   Manual therapy comments Lt shoulder PROM, grade 1-2 GH mobs for distraction, inferior, A-P mobs                     PT Education - 11/13/20 1119     Education Details HEP,POC, Ionto    Person(s) Educated Patient    Methods Explanation;Demonstration;Verbal cues;Handout    Comprehension Verbalized understanding;Need further instruction              PT Short Term Goals - 11/13/20 1128       PT SHORT TERM GOAL #1   Title Independent with HEP    Time 4    Period Weeks    Status New      PT SHORT TERM GOAL #2   Title Increase left shoulder flexion and abduction AROM at least 10 deg to improve ability to use left UE to wash hair    Baseline 100 deg    Time 4    Period Weeks    Status New               PT Long Term Goals - 11/13/20 1129       PT LONG TERM GOAL #1   Title Improve FOTO score to 62% functional or more    Baseline 61%    Time 12    Period Weeks    Status New    Target Date 02/05/21      PT LONG TERM GOAL #2   Title Improve Lt shoulder ROM to [facility].    Time 12    Period Weeks    Status New      PT LONG TERM GOAL #3   Title She will improve Lt shoulder and elbow strength to 4+/5 grossly    Baseline 3+ to 4    Time 12    Period Weeks    Status New      PT LONG TERM GOAL #4   Title Perform ADLs/IADLs incl. dressing, bathing with left shoulder pain 4/10 or less    Baseline 6 or more    Time 12    Period Weeks    Status New      PT LONG TERM GOAL #5   Title -                    Plan - 11/13/20 1120     Clinical Impression Statement Pt presents with Chronic Lt  shoulder pain, early adhesive capsulitis and RTC and biceps tendonitits, supraspinatus partial-thickness bursal surface tear and insertional interstitial tear confirmed on MRI. She will benefit from skilled PT to address her functional deficits listed below and to reduce pain. She relays she will be out of town for the next month and will set up PT visits when she returns. She was given larger HEP to trial in the meantime.    Personal Factors and Comorbidities Comorbidity 3+    Comorbidities PMH: fibromyalgia,DM,MS,HTN    Examination-Activity Limitations Bathing;Carry;Dressing;Lift;Reach Overhead    Examination-Participation Restrictions Cleaning;Driving;Laundry;Meal Prep    Stability/Clinical Decision Making  Evolving/Moderate complexity    Clinical Decision Making Moderate    Rehab Potential Good    PT Frequency 2x / week   1-2   PT Duration 12 weeks   longer plan due to she will be out of town for the next month   PT Treatment/Interventions ADLs/Self Care Home Management;Cryotherapy;Electrical Stimulation;Iontophoresis 10m/ml Dexamethasone;Moist Heat;Traction;Ultrasound;Therapeutic activities;Therapeutic exercise;Neuromuscular re-education;Patient/family education;Manual techniques;Passive range of motion;Dry needling;Joint Manipulations;Spinal Manipulations;Vasopneumatic Device;Taping    PT Next Visit Plan how was ionto?, how is HEP?    PT Home Exercise Plan Access Code: [email or web address]   Consulted and Agree with Plan of Care Patient             Patient will benefit from skilled therapeutic intervention in order to improve the following deficits and impairments:  Decreased activity tolerance, Decreased mobility, Decreased range of motion, Decreased strength, Impaired flexibility, Increased muscle spasms, Increased fascial restricitons, Impaired UE functional use, Postural dysfunction, Pain  Visit Diagnosis: Chronic left shoulder pain  Stiffness of left shoulder, not elsewhere  classified  Muscle weakness (generalized)     Problem List Patient Active Problem List   Diagnosis Date Noted   Stage 3a chronic kidney disease ([place] 10/21/2020   Type 2 diabetes mellitus with stage 3a chronic kidney disease, with long-term current use of insulin ([place] 10/21/2020   Type 2 diabetes mellitus with diabetic polyneuropathy, with long-term current use of insulin ([place] 06/20/2020   Pain in left shoulder 04/04/2020   Pain in left wrist 06/01/2019   Fracture of distal phalanx of right ring finger 03/15/2019   Facet arthritis, degenerative, 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   Polyp of transverse colon    Special screening for malignant neoplasms, colon    History of colonic polyps    Polyp of sigmoid colon    Cervicalgia 08/10/2018   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   Loss of weight    RLQ abdominal pain 03/27/2017   Bloating 03/27/2017   Abnormal finding on imaging 03/27/2017   Dilation of biliary tract 03/27/2017   Nausea 03/27/2017   Multiple joint pain 03/31/2016   Essential tremor 03/31/2016   Numbness in both hands 03/18/2016   Hypersomnia 11/09/2015   Diabetes mellitus ([place] 07/18/2015   Essential hypertension 07/18/2015   Abnormal ECG 07/18/2015   Gait difficulty 02/09/2015   Chronic left shoulder pain 02/09/2015   Depression with anxiety 02/09/2015   Numbness 02/09/2015   Insomnia 02/09/2015   Urinary hesitancy 02/09/2015   Other fatigue 02/09/2015    BDebbe Odea PT,DPT 11/13/2020, 11:35 AM  PHYSICAL THERAPY DISCHARGE SUMMARY  Visits from Start of Care: 1  Current functional level related to goals / functional outcomes: See note   Remaining deficits: See note   Education / Equipment: HEP   Patient goals were not met. Patient is being discharged due to not returning since the last visit.  MScot Jun PT, DPT, OCS, ATC 07/23/21  12:00 PM     [facility] [name]GRam[place]  [address]Phone: [phone]  Fax:  [phone] Name: DTonyia MarschallMRN: [id number] of Birth: 1March 20, 1957

## 2020-11-13 NOTE — Patient Instructions (Signed)
Access Code: [email or web address] URL: [email or web address] Date: 11/13/2020 Prepared by: Elsie Ra  Exercises Standing Shoulder Posterior Capsule Stretch - 2 x daily - 6 x weekly - 1 sets - 10 reps - 10 sec hold Seated Cervical Retraction - 2 x daily - 6 x weekly - 1-2 sets - 10 reps Doorway Pec Stretch at 90 Degrees Abduction - 2 x daily - 6 x weekly - 1 sets - 10 reps - 10 sec hold Standing Shoulder Flexion Wall Slide - 2 x daily - 6 x weekly - 1 sets - 10 reps - 5 sec hold Standing Shoulder Abduction Slides at Wall - 2 x daily - 6 x weekly - 3 sets - 10 reps - 5 sec hold Supine Shoulder Internal Rotation Stretch - 2 x daily - 6 x weekly - 1 sets - 10 reps - 10 sec hold Standing Shoulder Row with Anchored Resistance - 2 x daily - 6 x weekly - 3 sets - 10 reps Shoulder Extension with Resistance - Neutral - 2 x daily - 6 x weekly - 3 sets - 10 reps Shoulder External Rotation with Anchored Resistance - 2 x daily - 6 x weekly - 3 sets - 10 reps Shoulder Internal Rotation with Resistance - 2 x daily - 6 x weekly - 3 sets - 10 reps Standing Single Arm Elbow Flexion with Resistance - 2 x daily - 6 x weekly - 3 sets - 10 reps

## 2020-11-20 ENCOUNTER — Other Ambulatory Visit (INDEPENDENT_AMBULATORY_CARE_PROVIDER_SITE_OTHER): Payer: Medicare Other

## 2020-11-20 ENCOUNTER — Other Ambulatory Visit: Payer: Self-pay

## 2020-11-20 DIAGNOSIS — Z794 Long term (current) use of insulin: Secondary | ICD-10-CM | POA: Diagnosis not present

## 2020-11-20 DIAGNOSIS — E1142 Type 2 diabetes mellitus with diabetic polyneuropathy: Secondary | ICD-10-CM

## 2020-11-21 ENCOUNTER — Encounter: Payer: Self-pay | Admitting: Internal Medicine

## 2020-11-21 ENCOUNTER — Other Ambulatory Visit: Payer: Self-pay

## 2020-11-21 LAB — BASIC METABOLIC PANEL
BUN: 19 mg/dL (ref 6–23)
CO2: 26 mEq/L (ref 19–32)
Calcium: 9.4 mg/dL (ref 8.4–10.5)
Chloride: 102 mEq/L (ref 96–112)
Creatinine, Ser: 1.21 mg/dL — ABNORMAL HIGH (ref 0.40–1.20)
GFR: 46.89 mL/min — ABNORMAL LOW (ref >60.00)
Glucose, Bld: 116 mg/dL — ABNORMAL HIGH (ref 70–99)
Potassium: 3.9 mEq/L (ref 3.5–5.1)
Sodium: 138 mEq/L (ref 135–145)

## 2020-11-27 ENCOUNTER — Encounter: Payer: Self-pay | Admitting: Internal Medicine

## 2020-11-28 ENCOUNTER — Other Ambulatory Visit: Payer: Self-pay

## 2020-11-28 MED ORDER — FREESTYLE LIBRE 2 SENSOR MISC: 1.0000 | 3 refills | Status: DC

## 2020-11-28 NOTE — Telephone Encounter (Signed)
[facility]

## 2020-12-03 DIAGNOSIS — G8929 Other chronic pain: Secondary | ICD-10-CM | POA: Diagnosis not present

## 2020-12-03 DIAGNOSIS — F330 Major depressive disorder, recurrent, mild: Secondary | ICD-10-CM | POA: Diagnosis not present

## 2020-12-03 DIAGNOSIS — Z79899 Other long term (current) drug therapy: Secondary | ICD-10-CM | POA: Diagnosis not present

## 2020-12-03 DIAGNOSIS — F411 Generalized anxiety disorder: Secondary | ICD-10-CM | POA: Diagnosis not present

## 2020-12-06 DIAGNOSIS — M129 Arthropathy, unspecified: Secondary | ICD-10-CM | POA: Diagnosis not present

## 2020-12-06 DIAGNOSIS — E559 Vitamin D deficiency, unspecified: Secondary | ICD-10-CM | POA: Diagnosis not present

## 2020-12-06 DIAGNOSIS — R5383 Other fatigue: Secondary | ICD-10-CM | POA: Diagnosis not present

## 2020-12-06 DIAGNOSIS — G8929 Other chronic pain: Secondary | ICD-10-CM | POA: Diagnosis not present

## 2020-12-06 DIAGNOSIS — N184 Chronic kidney disease, stage 4 (severe): Secondary | ICD-10-CM | POA: Diagnosis not present

## 2020-12-06 DIAGNOSIS — Z79899 Other long term (current) drug therapy: Secondary | ICD-10-CM | POA: Diagnosis not present

## 2020-12-06 DIAGNOSIS — M545 Low back pain, unspecified: Secondary | ICD-10-CM | POA: Diagnosis not present

## 2020-12-06 DIAGNOSIS — Z1159 Encounter for screening for other viral diseases: Secondary | ICD-10-CM | POA: Diagnosis not present

## 2021-01-08 DIAGNOSIS — Z79899 Other long term (current) drug therapy: Secondary | ICD-10-CM | POA: Diagnosis not present

## 2021-01-08 DIAGNOSIS — G47429 Narcolepsy in conditions classified elsewhere without cataplexy: Secondary | ICD-10-CM | POA: Diagnosis not present

## 2021-01-08 DIAGNOSIS — G8929 Other chronic pain: Secondary | ICD-10-CM | POA: Diagnosis not present

## 2021-01-08 DIAGNOSIS — M545 Low back pain, unspecified: Secondary | ICD-10-CM | POA: Diagnosis not present

## 2021-01-18 ENCOUNTER — Encounter: Payer: Self-pay | Admitting: Internal Medicine

## 2021-01-23 ENCOUNTER — Ambulatory Visit: Payer: Medicare Other | Admitting: Internal Medicine

## 2021-01-28 ENCOUNTER — Encounter: Payer: Self-pay | Admitting: Internal Medicine

## 2021-01-28 ENCOUNTER — Ambulatory Visit (INDEPENDENT_AMBULATORY_CARE_PROVIDER_SITE_OTHER): Payer: Medicare Other | Admitting: Internal Medicine

## 2021-01-28 ENCOUNTER — Other Ambulatory Visit: Payer: Self-pay

## 2021-01-28 VITALS — BP 124/82 | HR 89 | Ht 65.0 in | Wt 160.2 lb

## 2021-01-28 DIAGNOSIS — Z794 Long term (current) use of insulin: Secondary | ICD-10-CM

## 2021-01-28 DIAGNOSIS — E1142 Type 2 diabetes mellitus with diabetic polyneuropathy: Secondary | ICD-10-CM | POA: Diagnosis not present

## 2021-01-28 DIAGNOSIS — E1122 Type 2 diabetes mellitus with diabetic chronic kidney disease: Secondary | ICD-10-CM

## 2021-01-28 DIAGNOSIS — N1831 Chronic kidney disease, stage 3a: Secondary | ICD-10-CM | POA: Diagnosis not present

## 2021-01-28 LAB — GLUCOSE, POCT (MANUAL RESULT ENTRY): POC Glucose: 204 mg/dl — AB (ref 70–99)

## 2021-01-28 LAB — POCT GLYCOSYLATED HEMOGLOBIN (HGB A1C): Hemoglobin A1C: 6.5 % — AB (ref 4.0–5.6)

## 2021-01-28 MED ORDER — DEXCOM G6 SENSOR MISC: 3 refills | Status: DC

## 2021-01-28 MED ORDER — DEXCOM G6 RECEIVER DEVI: 0 refills | Status: DC

## 2021-01-28 MED ORDER — DEXCOM G6 TRANSMITTER MISC: 3 refills | Status: DC

## 2021-01-28 MED ORDER — EMPAGLIFLOZIN 25 MG PO TABS: 25.0000 mg | ORAL_TABLET | Freq: Every day | ORAL | 3 refills | Status: DC

## 2021-01-28 MED ORDER — FREESTYLE LITE TEST VI STRP: 1.0000 | ORAL_STRIP | Freq: Two times a day (BID) | 3 refills | Status: DC

## 2021-01-28 MED ORDER — INSULIN ASPART PROT & ASPART (70-30 MIX) 100 UNIT/ML PEN: PEN_INJECTOR | SUBCUTANEOUS | 3 refills | Status: DC

## 2021-01-28 MED ORDER — INSULIN PEN NEEDLE 32G X 4 MM MISC
1.0000 | Freq: Two times a day (BID) | 3 refills | Status: DC
Start: 2021-01-28 — End: 2022-06-06

## 2021-01-28 NOTE — Telephone Encounter (Signed)
[facility]com [facility]

## 2021-01-28 NOTE — Patient Instructions (Addendum)
-   Keep Up the Good Work !  - Continue Jardiance 25 mg 1 tablet daily  - Continue  Novolog Mix 18 units with breakfast and 20 units with supper      HOW TO TREAT LOW BLOOD SUGARS (Blood sugar LESS THAN 70 MG/DL) Please follow the RULE OF 15 for the treatment of hypoglycemia treatment (when your (blood sugars are less than 70 mg/dL)   STEP 1: Take 15 grams of carbohydrates when your blood sugar is low, which includes:  3-4 GLUCOSE TABS  OR 3-4 OZ OF JUICE OR REGULAR SODA OR ONE TUBE OF GLUCOSE GEL    STEP 2: RECHECK blood sugar in 15 MINUTES STEP 3: If your blood sugar is still low at the 15 minute recheck --> then, go back to STEP 1 and treat AGAIN with another 15 grams of carbohydrates.

## 2021-01-28 NOTE — Progress Notes (Signed)
Name: Courtney [name]  Age/ Sex: 65 y.o., female   MRN/ DOB: [id number], Oct 27, 1955     PCP: Camillia Herter, NP   Reason for Endocrinology Evaluation: Type 2 Diabetes Mellitus  Initial Endocrine Consultative Visit: 06/20/2020    PATIENT IDENTIFIER: Courtney [name] is a 65 y.o. female with a past medical history of T2DM, MS , HTN and essential tremors. The patient has followed with Endocrinology clinic since 06/20/20 for consultative assistance with management of her diabetes.  DIABETIC HISTORY:  Courtney [name] was diagnosed with DM in 2008, Victoza caused nausea. Has been on insulin since 2011. Her hemoglobin A1c has ranged from 7.3% in 2017, peaking at 8.0% in years ago.   On her initial visit to our clinic she had an A1c of 6.4% , she was on Jardiance and novolog mix . She was having recurrent hypoglycemia at the time, we reduced insulin and continued Jardiance   SUBJECTIVE:   During the last visit (10/19/2020): A1c 6.8%  continued Jardiance and novolog mix       Today (01/28/2021): Courtney [name] is here for a follow up on diabetes management.  She checks her blood sugars occasionally  times daily except for the past 3 weeks . The patient has had hypoglycemic episodes since the last clinic visit but has reduced in frequency    She is searching for a new PCP     HOME DIABETES REGIMEN:  Jardiance 25 mg 1 tablet daily  Novolog Mix 18 units with breakfast and 20 units  supper      Statin: no ACE-I/ARB: no Prior Diabetic Education: no   METER DOWNLOAD SUMMARY: Did not bring       DIABETIC COMPLICATIONS: Microvascular complications:  Neuropathy Denies: retinopathy, CKD Last Eye Exam: Completed   Macrovascular complications:   Denies: CAD, CVA, PVD   HISTORY:  Past Medical History:  Past Medical History:  Diagnosis Date   Abnormal ECG 07/18/2015   Arthritis    Asthma    Bowel obstruction ([place])    Depression with anxiety 02/09/2015    mild xanax prn   Diabetes mellitus ([place]) 07/18/2015   type 2   Diverticulitis    Dysrhythmia    abnormal ekgs worked up   Essential tremor 03/31/2016   Fibromyalgia    Gait difficulty 02/09/2015   Gallstones    GERD (gastroesophageal reflux disease)    HLD (hyperlipidemia)    Hypersomnia 11/09/2015   Hypertension    no meds   Hyperthyroidism    patient states has no problems at present-01/25/2019   Hypothyroidism    patient states no problems at present 01/25/2019   IBS (irritable bowel syndrome)    Insomnia 02/09/2015   Migraines    MS (multiple sclerosis) ([place])    Multiple joint pain 03/31/2016   Numbness in both hands 03/18/2016   Other fatigue 02/09/2015   Peptic ulcer    Pneumonia    PONV (postoperative nausea and vomiting)    Urinary hesitancy 02/09/2015   Vision abnormalities    Past Surgical History:  Past Surgical History:  Procedure Laterality Date   ABDOMINAL HYSTERECTOMY  1976   including oophorectomy, due to uterine cancer   APPENDECTOMY  1974   BIOPSY  01/31/2019   Procedure: BIOPSY;  Surgeon: Mauri Pole, MD;  [facility];  Service: Endoscopy;;   CARPAL TUNNEL RELEASE Left    carpal tunnel surgery   CHOLECYSTECTOMY     2006   COLON  SURGERY     SBO x 2   COLONOSCOPY WITH PROPOFOL N/A 01/31/2019   Procedure: COLONOSCOPY WITH PROPOFOL;  Surgeon: Mauri Pole, MD;  [facility];  Service: Endoscopy;  Laterality: N/A;   EUS N/A 04/09/2017   Procedure: UPPER ENDOSCOPIC ULTRASOUND (EUS) LINEAR;  Surgeon: Milus Banister, MD;  [facility];  Service: Endoscopy;  Laterality: N/A;   HERNIA REPAIR     MENISCUS REPAIR Left    POLYPECTOMY  01/31/2019   Procedure: POLYPECTOMY;  Surgeon: Mauri Pole, MD;  [facility];  Service: Endoscopy;;   SMALL INTESTINE SURGERY     toenail removal Right 12/2017   great toenail    Social History:  reports that she quit smoking about 32 years ago. Her smoking use  included cigarettes. She has a 1.25 pack-year smoking history. She has never used smokeless tobacco. She reports current alcohol use. She reports that she does not use drugs. Family History:  Family History  Problem Relation Age of Onset   Heart disease Mother    Kidney disease Mother    Diabetes Mother    Other Father 75       murdered   Multiple sclerosis Daughter    Multiple sclerosis Other    Diabetes Sister    Alcohol abuse Brother        ETOH and marijuana   Diabetes Brother    Diabetes Sister    Diabetes Brother    ALS Brother      HOME MEDICATIONS: Allergies as of 01/28/2021       Reactions   Ibuprofen Other (See Comments)   Developed a ulcer  Other reaction(s): Other (See Comments) Developed a ulcer    Prednisone Anaphylaxis, Shortness Of Breath, Palpitations   Solu-medrol [methylprednisolone] Anaphylaxis, Shortness Of Breath, Palpitations   Bactrim [sulfamethoxazole-trimethoprim] Other (See Comments)   "blotchiness" redness, face swelling   Biaxin [clarithromycin] Swelling   Naproxen Other (See Comments)   Developed a ulcer    Sulfa Antibiotics Other (See Comments)   Other reaction(s): Angioedema Other reaction(s): Angioedema        Medication List        Accurate as of January 28, 2021 12:01 PM. If you have any questions, ask your nurse or doctor.          STOP taking these medications    lisinopril 2.5 MG tablet Commonly known as: Zestril Stopped by: Dorita Sciara, MD       TAKE these medications    acetaminophen 500 MG tablet Commonly known as: TYLENOL Take 1,000 mg by mouth every 8 (eight) hours as needed for headache.   alprazolam 2 MG tablet Commonly known as: XANAX Take 2 mg by mouth 3 (three) times daily as needed for anxiety.   Dexcom G6 Receiver [employer] Use as directed Started by: Jefferson Fuel, CMA   Dexcom G6 Sensor Misc Change every 10 days What changed:  how much to take how to take this when to take  this additional instructions Changed by: Dorita Sciara, MD   Dexcom G6 Transmitter Misc Change every 90 days Started by: Dorita Sciara, MD   empagliflozin 25 MG Tabs tablet Commonly known as: JARDIANCE Take 1 tablet (25 mg total) by mouth daily.   Fifty50 Glucose Meter 2.0 w/Device Kit Use to check blood sugar two times a day DX:E11.9   freestyle lancets 1 Device by Misc.(Non-Drug; Combo Route) route daily. USED TO CHECK BLOOD SUGAR TID   FREESTYLE  LITE test strip Generic drug: glucose blood 1 each by Other route in the morning and at bedtime. Use as instructed What changed: See the new instructions. Changed by: Dorita Sciara, MD   insulin aspart protamine - aspart (70-30) 100 UNIT/ML FlexPen Commonly known as: NOVOLOG 70/30 MIX Inject 18 Units into the skin daily with breakfast AND 20 Units daily with supper.   Insulin Pen Needle 32G X 4 MM Misc 1 Device by Does not apply route in the morning, at noon, in the evening, and at bedtime.   lidocaine 5 % ointment Commonly known as: XYLOCAINE Apply 1 application topically daily as needed for mild pain.   loratadine 10 MG tablet Commonly known as: CLARITIN Take by mouth.   multivitamin tablet Take 2 tablets by mouth daily. Gummies   naloxone 4 MG/0.1ML Liqd nasal spray kit Commonly known as: NARCAN Place 1 spray into the nose daily as needed (overdose).   oxyCODONE 15 MG immediate release tablet Commonly known as: ROXICODONE Take 15 mg by mouth 5 (five) times daily as needed.   pantoprazole 40 MG tablet Commonly known as: PROTONIX Take 1 tablet (40 mg total) by mouth daily.   triamcinolone 55 MCG/ACT Aero nasal inhaler Commonly known as: NASACORT Place 1 spray into the nose daily as needed (allergies).   VITAMIN B 12 PO Take by mouth daily. gummies         OBJECTIVE:   Vital Signs: BP 124/82 (BP Location: Left Arm, Patient Position: Sitting, Cuff Size: Small)   Pulse 89   Ht 5' 5"  (1.651 m)   Wt 160 lb 3.2 oz (72.7 kg)   SpO2 99%   BMI 26.66 kg/m   Wt Readings from Last 3 Encounters:  01/28/21 160 lb 3.2 oz (72.7 kg)  10/19/20 157 lb 9.6 oz (71.5 kg)  06/20/20 158 lb (71.7 kg)     Exam: General: Pt appears well and is in NAD  Lungs: Clear with good BS bilat with no rales, rhonchi, or wheezes  Heart: RRR with normal S1 and S2 and no gallops; no murmurs; no rub  Abdomen: Normoactive bowel sounds, soft, nontender, without masses or organomegaly palpable  Extremities: No pretibial edema.   Neuro: MS is good with appropriate affect, pt is alert and Ox3   DM Foot Exam 01/28/2021   The skin of the feet is intact without sores or ulcerations. The pedal pulses are 2+ on right and 2+ on left. The sensation is intact to a screening 5.07, 10 gram monofilament bilaterally    DATA REVIEWED:  Lab Results  Component Value Date   HGBA1C 6.5 (A) 01/28/2021   HGBA1C 6.8 (A) 10/19/2020   HGBA1C 6.4 (A) 06/20/2020     ASSESSMENT / PLAN / RECOMMENDATIONS:   1) Type 2 Diabetes Mellitus, Optimally controlled, With neuropathic and CKD III complications - Most recent A1c of 6.5 %. Goal A1c < 7.0 %.    - A1c continues at goal - She had recent labs through her PCP at [facility] and per pt her GFR in low 50's  - I have tried to prescribe CGM but she was not able to receive [facility], she [facility]  - No changes    MEDICATIONS: Continue Jardiance 25 mg 1 tablet daily  Continue  Novolog 18 units with breakfast and 20 units with  supper     EDUCATION / INSTRUCTIONS: BG monitoring instructions: Patient is instructed to  check her blood sugars 2 times a day, breakfast and supper. Call [facility] if: BG persistently < 70  I reviewed the Rule of 15 for the treatment of hypoglycemia in detail with the patient. Literature supplied.   2) Diabetic complications:  Eye: Does not have known diabetic  retinopathy.  Neuro/ Feet: Does  have known diabetic peripheral neuropathy .  Renal: Patient does not have known baseline CKD. She   is not on an ACEI/ARB at present.      3) Lipids: Patient is has been off statins due to severe cramps. LDL 72 mg/dL . She declines statin therapy if needed in the future due to cramps        4) CKD III :   -She had a low GFR in 04/2020, she used to be on ACE/ARB but this was stopped due to low BP. I tried to restart lisinopril 2.5 mg 10/2020 but was unable due to low BP      F/U in 3 months      Signed electronically by: Mack Guise, MD  [facility]  [facility] [address] [place], [place] [address] Phone: [phone] FAX: [phone]   CC: Camillia Herter, NP [place] [address] Phone: [phone]  Fax: [phone]  Return to Endocrinology clinic as below: Future Appointments  Date Time Provider [place]  03/12/2021  2:00 PM Jettie Booze, MD [facility]

## 2021-01-29 ENCOUNTER — Encounter: Payer: Self-pay | Admitting: Internal Medicine

## 2021-01-30 DIAGNOSIS — F411 Generalized anxiety disorder: Secondary | ICD-10-CM | POA: Diagnosis not present

## 2021-01-30 DIAGNOSIS — Z79899 Other long term (current) drug therapy: Secondary | ICD-10-CM | POA: Diagnosis not present

## 2021-01-30 DIAGNOSIS — G8929 Other chronic pain: Secondary | ICD-10-CM | POA: Diagnosis not present

## 2021-01-31 ENCOUNTER — Telehealth: Payer: Self-pay | Admitting: Pharmacy Technician

## 2021-01-31 ENCOUNTER — Other Ambulatory Visit (HOSPITAL_COMMUNITY): Payer: Self-pay

## 2021-01-31 NOTE — Telephone Encounter (Signed)
Patient Advocate Encounter   Received notification from pt/CMA/office that prior authorization for Dexcom is required by his/her insurance [employer].   PA submitted on 01/31/21 Key [id number] Status is pending    [facility] will continue to follow:  Patient Advocate Fax:  [phone]

## 2021-01-31 NOTE — Telephone Encounter (Signed)
Patient Advocate Encounter   Started another PA in [facility] Key: [id number] because, after consulting with a Pharmacist we feel one of the questions for the PA should have been answered differently. Novolog 70/30 mix should count as basal and prandial insulin, so this may change the outcome of the PA. Unfortunately it's not populating the clinical questions again. I will call them to follow up.

## 2021-02-01 ENCOUNTER — Other Ambulatory Visit (HOSPITAL_COMMUNITY): Payer: Self-pay

## 2021-02-01 NOTE — Telephone Encounter (Signed)
Patient Advocate Encounter  Prior Authorization for Dexcom has been approved.    PA# Case ID: [id number] (sensors) [id number] (transmitter) Effective dates: 01/02/21 through 02/01/22  Unable to contact pharmacy to process

## 2021-02-01 NOTE — Telephone Encounter (Signed)
Vm left for patient and also mychart message sent

## 2021-02-05 ENCOUNTER — Encounter: Payer: Self-pay | Admitting: Internal Medicine

## 2021-02-05 DIAGNOSIS — H2513 Age-related nuclear cataract, bilateral: Secondary | ICD-10-CM | POA: Diagnosis not present

## 2021-02-05 DIAGNOSIS — G35 Multiple sclerosis: Secondary | ICD-10-CM | POA: Diagnosis not present

## 2021-02-05 DIAGNOSIS — E119 Type 2 diabetes mellitus without complications: Secondary | ICD-10-CM | POA: Diagnosis not present

## 2021-02-05 DIAGNOSIS — H5010 Unspecified exotropia: Secondary | ICD-10-CM | POA: Diagnosis not present

## 2021-02-05 DIAGNOSIS — H04123 Dry eye syndrome of bilateral lacrimal glands: Secondary | ICD-10-CM | POA: Diagnosis not present

## 2021-02-05 LAB — HM DIABETES EYE EXAM

## 2021-02-07 DIAGNOSIS — G8929 Other chronic pain: Secondary | ICD-10-CM | POA: Diagnosis not present

## 2021-02-07 DIAGNOSIS — Z79899 Other long term (current) drug therapy: Secondary | ICD-10-CM | POA: Diagnosis not present

## 2021-02-07 DIAGNOSIS — M545 Low back pain, unspecified: Secondary | ICD-10-CM | POA: Diagnosis not present
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
## 2021-02-19 ENCOUNTER — Encounter: Payer: Self-pay | Admitting: Family

## 2021-02-19 ENCOUNTER — Ambulatory Visit (INDEPENDENT_AMBULATORY_CARE_PROVIDER_SITE_OTHER): Payer: Medicare Other | Admitting: Family

## 2021-02-19 ENCOUNTER — Other Ambulatory Visit: Payer: Self-pay

## 2021-02-19 VITALS — BP 128/77 | HR 92 | Temp 98.8°F | Resp 18 | Ht 65.0 in | Wt 158.0 lb

## 2021-02-19 DIAGNOSIS — E119 Type 2 diabetes mellitus without complications: Secondary | ICD-10-CM | POA: Diagnosis not present

## 2021-02-19 DIAGNOSIS — I10 Essential (primary) hypertension: Secondary | ICD-10-CM | POA: Diagnosis not present

## 2021-02-19 DIAGNOSIS — Z794 Long term (current) use of insulin: Secondary | ICD-10-CM | POA: Diagnosis not present

## 2021-02-19 DIAGNOSIS — R350 Frequency of micturition: Secondary | ICD-10-CM | POA: Diagnosis not present

## 2021-02-19 DIAGNOSIS — G894 Chronic pain syndrome: Secondary | ICD-10-CM | POA: Diagnosis not present

## 2021-02-19 LAB — POCT URINALYSIS DIP (CLINITEK)
Bilirubin, UA: NEGATIVE
Blood, UA: NEGATIVE
Glucose, UA: >=1000 mg/dL — AB
Ketones, POC UA: NEGATIVE mg/dL
Leukocytes, UA: NEGATIVE
Nitrite, UA: NEGATIVE
POC PROTEIN,UA: NEGATIVE
Spec Grav, UA: 1.015 (ref 1.010–1.025)
Urobilinogen, UA: 0.2 E.U./dL
pH, UA: 5.5 (ref 5.0–8.0)

## 2021-02-19 NOTE — Progress Notes (Signed)
Pt presents to follow-up with provider from visit on 05/22/2020,

## 2021-02-19 NOTE — Progress Notes (Signed)
No urinary tract infection. Sugar high in the urine, keep all scheduled appointments with Endocrinology.

## 2021-02-26 DIAGNOSIS — Z20822 Contact with and (suspected) exposure to covid-19: Secondary | ICD-10-CM | POA: Diagnosis not present

## 2021-02-27 ENCOUNTER — Encounter: Payer: Self-pay | Admitting: Orthopaedic Surgery

## 2021-03-03 DIAGNOSIS — Z20822 Contact with and (suspected) exposure to covid-19: Secondary | ICD-10-CM | POA: Diagnosis not present

## 2021-03-06 DIAGNOSIS — M545 Low back pain, unspecified: Secondary | ICD-10-CM | POA: Diagnosis not present

## 2021-03-06 DIAGNOSIS — M461 Sacroiliitis, not elsewhere classified: Secondary | ICD-10-CM | POA: Diagnosis not present

## 2021-03-06 DIAGNOSIS — G8929 Other chronic pain: Secondary | ICD-10-CM | POA: Diagnosis not present

## 2021-03-06 DIAGNOSIS — G47429 Narcolepsy in conditions classified elsewhere without cataplexy: Secondary | ICD-10-CM | POA: Diagnosis not present

## 2021-03-06 DIAGNOSIS — Z79899 Other long term (current) drug therapy: Secondary | ICD-10-CM | POA: Diagnosis not present

## 2021-03-12 ENCOUNTER — Ambulatory Visit: Payer: Medicare Other | Admitting: Interventional Cardiology

## 2021-03-12 DIAGNOSIS — Z79899 Other long term (current) drug therapy: Secondary | ICD-10-CM | POA: Diagnosis not present

## 2021-03-17 DIAGNOSIS — Z20822 Contact with and (suspected) exposure to covid-19: Secondary | ICD-10-CM | POA: Diagnosis not present

## 2021-04-01 ENCOUNTER — Encounter: Payer: Self-pay | Admitting: Interventional Cardiology

## 2021-04-03 DIAGNOSIS — Z20822 Contact with and (suspected) exposure to covid-19: Secondary | ICD-10-CM | POA: Diagnosis not present

## 2021-04-04 DIAGNOSIS — Z79899 Other long term (current) drug therapy: Secondary | ICD-10-CM | POA: Diagnosis not present

## 2021-04-04 DIAGNOSIS — M461 Sacroiliitis, not elsewhere classified: Secondary | ICD-10-CM | POA: Diagnosis not present

## 2021-04-04 DIAGNOSIS — G8929 Other chronic pain: Secondary | ICD-10-CM | POA: Diagnosis not present

## 2021-04-04 DIAGNOSIS — M545 Low back pain, unspecified: Secondary | ICD-10-CM | POA: Diagnosis not present

## 2021-04-04 DIAGNOSIS — G47429 Narcolepsy in conditions classified elsewhere without cataplexy: Secondary | ICD-10-CM | POA: Diagnosis not present

## 2021-04-04 DIAGNOSIS — Z6828 Body mass index (BMI) 28.0-28.9, adult: Secondary | ICD-10-CM | POA: Diagnosis not present

## 2021-04-08 DIAGNOSIS — Z79899 Other long term (current) drug therapy: Secondary | ICD-10-CM | POA: Diagnosis not present

## 2021-04-09 DIAGNOSIS — Z79899 Other long term (current) drug therapy: Secondary | ICD-10-CM | POA: Diagnosis not present

## 2021-04-09 DIAGNOSIS — F411 Generalized anxiety disorder: Secondary | ICD-10-CM | POA: Diagnosis not present

## 2021-04-09 DIAGNOSIS — F4321 Adjustment disorder with depressed mood: Secondary | ICD-10-CM | POA: Diagnosis not present

## 2021-04-09 DIAGNOSIS — Z634 Disappearance and death of family member: Secondary | ICD-10-CM | POA: Diagnosis not present

## 2021-04-09 DIAGNOSIS — G8929 Other chronic pain: Secondary | ICD-10-CM | POA: Diagnosis not present

## 2021-04-12 DIAGNOSIS — S300XXA Contusion of lower back and pelvis, initial encounter: Secondary | ICD-10-CM | POA: Diagnosis not present

## 2021-04-28 NOTE — Progress Notes (Signed)
Cardiology Office Note   Date:  04/30/2021   ID:  Courtney [name], DOB 20-Oct-1955, MRN [id number]  PCP:  Camillia Herter, NP    Chief Complaint  Patient presents with   Follow-up     Wt Readings from Last 3 Encounters:  04/30/21 161 lb (73 kg)  02/19/21 158 lb (71.7 kg)  01/28/21 160 lb 3.2 oz (72.7 kg)       History of Present Illness: Courtney [name] is a 66 y.o. female  who has had a h/o recurrent chest pain.  SHe has been evaluated with cardiac cath in 2001 and 2006.  SHe has had multiple stress tests most recently in 2016 which was normal.     She moved to [place] in 10/16 to find warmer weather.  She has been diagnosed with MS and wants to be in a warmer climate.    She has occasionally had a skipped heart beat in the past.   In 2020, she has continued to have pain.  She has had a chronically sore throat and was seen by ENT.  She was started on BP meds but BP got quite low.  BP was fine after stopping them.   In 2021, she reported: "Mild DOE with going up the stairs.  No problems on flat surfaces.  Rides stationary bike 30 minutes several times a week without difficulty. She lost weight in the last year."  BPs at home have been in the [id number] systolic for the most part after she stopped her BP meds.  She has been checking at home regularly.  She prefers to stay off the meds.    Denies : Chest pain. Dizziness. Leg edema. Nitroglycerin use. Orthopnea. Palpitations. Paroxysmal nocturnal dyspnea. Shortness of breath. Syncope.         Past Medical History:  Diagnosis Date   Abnormal ECG 07/18/2015   Arthritis    Asthma    Bowel obstruction (HCC)    Depression with anxiety 02/09/2015   mild xanax prn   Diabetes mellitus ([place]) 07/18/2015   type 2   Diverticulitis    Dysrhythmia    abnormal ekgs worked up   Essential tremor 03/31/2016   Fibromyalgia    Gait difficulty 02/09/2015   Gallstones    GERD (gastroesophageal reflux disease)    HLD  (hyperlipidemia)    Hypersomnia 11/09/2015   Hypertension    no meds   Hyperthyroidism    patient states has no problems at present-01/25/2019   Hypothyroidism    patient states no problems at present 01/25/2019   IBS (irritable bowel syndrome)    Insomnia 02/09/2015   Migraines    MS (multiple sclerosis) ([place])    Multiple joint pain 03/31/2016   Numbness in both hands 03/18/2016   Other fatigue 02/09/2015   Peptic ulcer    Pneumonia    PONV (postoperative nausea and vomiting)    Urinary hesitancy 02/09/2015   Vision abnormalities     Past Surgical History:  Procedure Laterality Date   ABDOMINAL HYSTERECTOMY  1976   including oophorectomy, due to uterine cancer   [place]  01/31/2019   Procedure: BIOPSY;  Surgeon: Mauri Pole, MD;  [facility];  Service: Endoscopy;;   CARPAL TUNNEL RELEASE Left    carpal tunnel surgery   CHOLECYSTECTOMY     2006   COLON SURGERY     SBO x 2   COLONOSCOPY WITH PROPOFOL N/A 01/31/2019  Procedure: COLONOSCOPY WITH PROPOFOL;  Surgeon: Mauri Pole, MD;  [facility];  Service: Endoscopy;  Laterality: N/A;   EUS N/A 04/09/2017   Procedure: UPPER ENDOSCOPIC ULTRASOUND (EUS) LINEAR;  Surgeon: Milus Banister, MD;  [facility];  Service: Endoscopy;  Laterality: N/A;   HERNIA REPAIR     MENISCUS REPAIR Left    POLYPECTOMY  01/31/2019   Procedure: POLYPECTOMY;  Surgeon: Mauri Pole, MD;  [facility];  Service: Endoscopy;;   SMALL INTESTINE SURGERY     toenail removal Right 12/2017   great toenail      Current Outpatient Medications  Medication Sig Dispense Refill   acetaminophen (TYLENOL) 500 MG tablet Take 1,000 mg by mouth every 8 (eight) hours as needed for headache.     alprazolam (XANAX) 2 MG tablet Take 2 mg by mouth 3 (three) times daily as needed for anxiety.      empagliflozin (JARDIANCE) 25 MG TABS tablet Take 1 tablet (25 mg total) by mouth daily. 90  tablet 3   glucose blood (FREESTYLE LITE) test strip 1 each by Other route in the morning and at bedtime. Use as instructed 200 each 3   insulin aspart protamine - aspart (NOVOLOG 70/30 MIX) (70-30) 100 UNIT/ML FlexPen Inject 18 Units into the skin daily with breakfast AND 20 Units daily with supper. 30 mL 3   Insulin Pen Needle 32G X 4 MM MISC 1 Device by Does not apply route in the morning and at bedtime. 200 each 3   Lancets (FREESTYLE) lancets 1 Device by Misc.(Non-Drug; Combo Route) route daily. USED TO CHECK BLOOD SUGAR TID     loratadine (CLARITIN) 10 MG tablet Take by mouth.     Multiple Vitamin (MULTIVITAMIN) tablet Take 2 tablets by mouth daily. Gummies     naloxone (NARCAN) nasal spray 4 mg/0.1 mL Place 1 spray into the nose daily as needed (overdose).     oxyCODONE (ROXICODONE) 15 MG immediate release tablet Take 15 mg by mouth 5 (five) times daily as needed.     pantoprazole (PROTONIX) 40 MG tablet Take 1 tablet (40 mg total) by mouth daily. 30 tablet 11   No current facility-administered medications for this visit.    Allergies:   Ibuprofen, Prednisone, Solu-medrol [methylprednisolone], Bactrim [sulfamethoxazole-trimethoprim], Biaxin [clarithromycin], Naproxen, and Sulfa antibiotics    Social History:  The patient  reports that she quit smoking about 33 years ago. Her smoking use included cigarettes. She has a 1.25 pack-year smoking history. She has never used smokeless tobacco. She reports current alcohol use. She reports that she does not use drugs.   Family History:  [name] family history includes ALS in her brother; Alcohol abuse in her brother; Diabetes in her brother, brother, mother, sister, and sister; Heart disease in her mother; Kidney disease in her mother; Multiple sclerosis in her daughter and another family member; Other (age of onset: 29) in her father.    ROS:  Please see the history of present illness.   Otherwise, review of systems are positive for some  unsteadiness on her feet.   All other systems are reviewed and negative.    PHYSICAL EXAM: VS:  BP 114/80    Pulse 75    Ht 5\' 3"  (1.6 m)    Wt 161 lb (73 kg)    SpO2 97%    BMI 28.52 kg/m  , BMI Body mass index is 28.52 kg/m. GEN: Well nourished, well developed, in no acute distress HEENT: normal  Neck: no JVD, carotid bruits, or masses Cardiac: RRR; no murmurs, rubs, or gallops,no edema  Respiratory:  clear to auscultation bilaterally, normal work of breathing GI: soft, nontender, nondistended, + BS MS: no deformity or atrophy Skin: warm and dry, no rash Neuro:  Strength and sensation are intact Psych: euthymic mood, full affect   EKG:   The ekg ordered today demonstrates NSR, LVH- no changed from prior   Recent Labs: 11/20/2020: BUN 19; Creatinine, Ser 1.21; Potassium 3.9; Sodium 138   Lipid Panel    Component Value Date/Time   CHOL 177 01/30/2020 0810   TRIG 138 01/30/2020 0810   HDL 82 01/30/2020 0810   CHOLHDL 2.2 01/30/2020 0810   LDLCALC 72 01/30/2020 0810     Other studies Reviewed: Additional studies/ records that were reviewed today with results demonstrating: labs reviewed, LDL 72 in 2021, HDL 82.   ASSESSMENT AND PLAN:  DM: A1C 6.5 in 01/2021.  Whole food, plant-based diet with lots of fiber. Statin intolerance- leg cramps years ago.  Needs fasting lipids at some point. Well controlled in the past.  HTN: Blood pressure controlled now off of medications.  Lifestyle changes have been helpful.  Would not restart medicines with the blood pressure readings that she brought in from home..  Continue walking.  Avoid processed foods and excessive salt.  Whole food, plant-based diet recommended. MS: Has had some falls.  Try to be steady on her feet and avoid falls.  Chest pain:  Noted in the past.  Multiple ischemic evals done.  None at this time.  Healthy lifestyle reviewed.    Current medicines are reviewed at length with the patient today.  The patient concerns  regarding her medicines were addressed.  The following changes have been made:  No change  Labs/ tests ordered today include:  No orders of the defined types were placed in this encounter.   Recommend 150 minutes/week of aerobic exercise Low fat, low carb, high fiber diet recommended  Disposition:   FU in 1 year   Signed, Larae Grooms, MD  04/30/2021 10:31 AM    [facility] [address], [place], [place]  [address] Phone: [phone]; Fax: [phone]

## 2021-04-30 ENCOUNTER — Other Ambulatory Visit: Payer: Self-pay

## 2021-04-30 ENCOUNTER — Ambulatory Visit (INDEPENDENT_AMBULATORY_CARE_PROVIDER_SITE_OTHER): Payer: Medicare Other | Admitting: Physician Assistant

## 2021-04-30 ENCOUNTER — Encounter: Payer: Self-pay | Admitting: Physician Assistant

## 2021-04-30 ENCOUNTER — Ambulatory Visit (INDEPENDENT_AMBULATORY_CARE_PROVIDER_SITE_OTHER): Payer: Medicare Other | Admitting: Interventional Cardiology

## 2021-04-30 ENCOUNTER — Encounter: Payer: Self-pay | Admitting: Interventional Cardiology

## 2021-04-30 VITALS — BP 114/80 | HR 75 | Ht 63.0 in | Wt 161.0 lb

## 2021-04-30 DIAGNOSIS — G35 Multiple sclerosis: Secondary | ICD-10-CM

## 2021-04-30 DIAGNOSIS — M25522 Pain in left elbow: Secondary | ICD-10-CM | POA: Diagnosis not present

## 2021-04-30 DIAGNOSIS — G8929 Other chronic pain: Secondary | ICD-10-CM | POA: Diagnosis not present

## 2021-04-30 DIAGNOSIS — E118 Type 2 diabetes mellitus with unspecified complications: Secondary | ICD-10-CM

## 2021-04-30 DIAGNOSIS — M25512 Pain in left shoulder: Secondary | ICD-10-CM | POA: Diagnosis not present

## 2021-04-30 DIAGNOSIS — R072 Precordial pain: Secondary | ICD-10-CM | POA: Diagnosis not present

## 2021-04-30 DIAGNOSIS — Z789 Other specified health status: Secondary | ICD-10-CM

## 2021-04-30 DIAGNOSIS — I10 Essential (primary) hypertension: Secondary | ICD-10-CM | POA: Diagnosis not present

## 2021-04-30 DIAGNOSIS — M7522 Bicipital tendinitis, left shoulder: Secondary | ICD-10-CM | POA: Diagnosis not present

## 2021-04-30 NOTE — Progress Notes (Signed)
Office Visit Note   Patient: Courtney [name]           Date of Birth: January 05, 1956           MRN: [id number] Visit Date: 04/30/2021              Requested by: Camillia Herter, NP [address] [place],  [place] [address] PCP: Camillia Herter, NP  Chief Complaint  Patient presents with   Left Shoulder - Follow-up      HPI: Patient is a pleasant 67 year old woman with a history of left shoulder pain and adhesive capsulitis.  She underwent a manipulation under anesthesia with improvement.  She comes in today complaining of fullness in pain all the time at the distal end of her biceps at the insertion into the elbow.  She says this has been present since her manipulation.  She has had an MRI last year of her shoulder and was hopeful that they could expand the MRI down so she could see what had been going on with her elbow but they were not able to do this.  This is now been going on for over a year.  She does have fullness and extreme tenderness to palpation in this area.  She has had physical therapy for her shoulder but this has not helped the elbow.  She has tried CBD oil and topical Voltaren gel.  She does have a hip history of fibromyalgia and this is managed with oxycodone which also has not helped this pain.  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  She fell onto her right side and is complaining of some thumb pain.  She declines x-rays on any of this today  Assessment & Plan: Visit Diagnoses:  1. Chronic left shoulder pain   2. Pain in left elbow   3. Biceps tendonitis on left     Plan: 66 year old woman with history of left shoulder adhesive capsulitis.  She has had a manipulation under anesthesia.  She did have an MRI of the shoulder in August 2022 which showed tendinosis but no full-thickness tearing of the supraspinatus.  Today she is more complaining of pain at the distal biceps as it inserts into the elbow.  She said this  is been going on since her manipulation.  She is has not had any improvement with topical analgesia physical therapy.  She does take oxycodone for fibromyalgia and this has not helped either.  We will get a focused MRI study of the area of the distal biceps.  She is quite tender does have fullness there she does have fair biceps strength and I can palpate the tendon but certainly it is extremely painful Follow-Up Instructions: Return after mri.   Ortho Exam  Patient is alert, oriented, no adenopathy, well-dressed, normal affect, normal respiratory effort. Examination of her left shoulder she does have forward elevation to 170 degrees actively she can go to her back pocket.  She has some tenderness but most of her tenderness is focused in the distal bicep.  I can palpate the tendon but she is extremely tender.  No cellulitis or redness.  She does have pain with flexing her biceps muscle.  Fairly equivalent to the other side as far as contour.  She does have pain with internal/external rotation.  Thumb on the right she has good capillary refill some tenderness over the PIP joint pain with opposing her lesser fingers  Imaging:  No results found. No images are attached to the encounter.  Labs: Lab Results  Component Value Date   HGBA1C 6.5 (A) 01/28/2021   HGBA1C 6.8 (A) 10/19/2020   HGBA1C 6.4 (A) 06/20/2020   ESRSEDRATE 14 11/27/2017   ESRSEDRATE 7 03/31/2016   CRP 2.4 03/31/2016   LABURIC 4.5 11/27/2017   REPTSTATUS 03/03/2020 FINAL 02/29/2020   CULT  02/29/2020    NO GROUP A STREP (S.PYOGENES) ISOLATED Performed at [facility], [place] [address] [place], [place] [address]      Lab Results  Component Value Date   ALBUMIN 4.9 04/26/2018   ALBUMIN 4.5 04/01/2017   ALBUMIN 4.3 01/24/2017    No results found for: MG No results found for: VD25OH  No results found for: PREALBUMIN CBC EXTENDED Latest Ref Rng & Units 04/26/2018 11/27/2017 04/01/2017  WBC 4.0 - 10.5 K/uL 6.4 5.2 7.8   RBC 3.87 - 5.11 Mil/uL 5.12(H) 4.64 4.96  HGB 12.0 - 15.0 g/dL 14.5 12.8 14.0  HCT 36.0 - 46.0 % 43.0 38.7 41.3  PLT 150.0 - 400.0 K/uL 179.0 173 151  NEUTROABS 1.4 - 7.7 K/uL 3.5 2,080 -  LYMPHSABS 0.7 - 4.0 K/uL 2.2 2,506 -     There is no height or weight on file to calculate BMI.  Orders:  Orders Placed This Encounter  Procedures   MR Elbow Left w/o contrast   No orders of the defined types were placed in this encounter.    Procedures: No procedures performed  Clinical Data: No additional findings.  ROS:  All other systems negative, except as noted in the HPI. Review of Systems  Objective: Vital Signs: There were no vitals taken for this visit.  Specialty Comments:  No specialty comments available.  PMFS History: Patient Active Problem List   Diagnosis Date Noted   Biceps tendonitis on left 04/30/2021   Stage 3a chronic kidney disease ([place]) 10/21/2020   Type 2 diabetes mellitus with stage 3a chronic kidney disease, with long-term current use of insulin ([place]) 10/21/2020   Type 2 diabetes mellitus with diabetic polyneuropathy, with long-term current use of insulin ([place]) 06/20/2020   Pain in left shoulder 04/04/2020   Pain in left wrist 06/01/2019   Fracture of distal phalanx of right ring finger 03/15/2019   Facet arthritis, degenerative, 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   Polyp of transverse colon    Special screening for malignant neoplasms, colon    History of colonic polyps    Polyp of sigmoid colon    Cervicalgia 08/10/2018   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   Loss of weight    RLQ abdominal pain 03/27/2017   Bloating 03/27/2017   Abnormal finding on imaging 03/27/2017   Dilation of biliary tract 03/27/2017   Nausea 03/27/2017   Multiple joint pain 03/31/2016   Essential tremor 03/31/2016   Numbness in both hands 03/18/2016   Hypersomnia 11/09/2015   Diabetes mellitus ([place]) 07/18/2015   Essential hypertension 07/18/2015   Abnormal ECG  07/18/2015   Gait difficulty 02/09/2015   Chronic left shoulder pain 02/09/2015   Depression with anxiety 02/09/2015   Numbness 02/09/2015   Insomnia 02/09/2015   Urinary hesitancy 02/09/2015   Other fatigue 02/09/2015   Past Medical History:  Diagnosis Date   Abnormal ECG 07/18/2015   Arthritis    Asthma    Bowel obstruction ([place])    Depression with anxiety 02/09/2015   mild xanax prn   Diabetes mellitus ([place]) 07/18/2015   type 2  Diverticulitis    Dysrhythmia    abnormal ekgs worked up   Essential tremor 03/31/2016   Fibromyalgia    Gait difficulty 02/09/2015   Gallstones    GERD (gastroesophageal reflux disease)    HLD (hyperlipidemia)    Hypersomnia 11/09/2015   Hypertension    no meds   Hyperthyroidism    patient states has no problems at present-01/25/2019   Hypothyroidism    patient states no problems at present 01/25/2019   IBS (irritable bowel syndrome)    Insomnia 02/09/2015   Migraines    MS (multiple sclerosis) ([place])    Multiple joint pain 03/31/2016   Numbness in both hands 03/18/2016   Other fatigue 02/09/2015   Peptic ulcer    Pneumonia    PONV (postoperative nausea and vomiting)    Urinary hesitancy 02/09/2015   Vision abnormalities     Family History  Problem Relation Age of Onset   Heart disease Mother    Kidney disease Mother    Diabetes Mother    Other Father 25       murdered   Multiple sclerosis Daughter    Multiple sclerosis Other    Diabetes Sister    Alcohol abuse Brother        ETOH and marijuana   Diabetes Brother    Diabetes Sister    Diabetes Brother    ALS Brother     Past Surgical History:  Procedure Laterality Date   ABDOMINAL HYSTERECTOMY  1976   including oophorectomy, due to uterine cancer   APPENDECTOMY  1974   BIOPSY  01/31/2019   Procedure: BIOPSY;  Surgeon: Mauri Pole, MD;  Location: [facility];  Service: Endoscopy;;   CARPAL TUNNEL RELEASE Left    carpal tunnel surgery   CHOLECYSTECTOMY     2006    COLON SURGERY     SBO x 2   COLONOSCOPY WITH PROPOFOL N/A 01/31/2019   Procedure: COLONOSCOPY WITH PROPOFOL;  Surgeon: Mauri Pole, MD;  Location: [facility];  Service: Endoscopy;  Laterality: N/A;   EUS N/A 04/09/2017   Procedure: UPPER ENDOSCOPIC ULTRASOUND (EUS) LINEAR;  Surgeon: Milus Banister, MD;  Location: [facility];  Service: Endoscopy;  Laterality: N/A;   HERNIA REPAIR     MENISCUS REPAIR Left    POLYPECTOMY  01/31/2019   Procedure: POLYPECTOMY;  Surgeon: Mauri Pole, MD;  Location: [facility];  Service: Endoscopy;;   SMALL INTESTINE SURGERY     toenail removal Right 12/2017   great toenail    Social History   Occupational History   Occupation: disabled  Tobacco Use   Smoking status: Former    Packs/day: 0.25    Years: 5.00    Pack years: 1.25    Types: Cigarettes    Quit date: 1990    Years since quitting: 33.1   Smokeless tobacco: Never  Vaping Use   Vaping Use: Never used  Substance and Sexual Activity   Alcohol use: Yes    Comment: rarely   Drug use: No   Sexual activity: Yes

## 2021-04-30 NOTE — Patient Instructions (Signed)
Medication Instructions:  Your physician recommends that you continue on your current medications as directed. Please refer to the Current Medication list given to you today.  *If you need a refill on your cardiac medications before your next appointment, please call your pharmacy*   Lab Work: none If you have labs (blood work) drawn today and your tests are completely normal, you will receive your results only by: MyChart Message (if you have MyChart) OR A paper copy in the mail If you have any lab test that is abnormal or we need to change your treatment, we will call you to review the results.   Testing/Procedures: none   Follow-Up: At [facility], you and your health needs are our priority.  As part of our continuing mission to provide you with exceptional heart care, we have created designated Provider Care Teams.  These Care Teams include your primary Cardiologist (physician) and Advanced Practice Providers (APPs -  Physician Assistants and Nurse Practitioners) who all work together to provide you with the care you need, when you need it.  We recommend signing up for the patient portal called "MyChart".  Sign up information is provided on this After Visit Summary.  MyChart is used to connect with patients for Virtual Visits (Telemedicine).  Patients are able to view lab/test results, encounter notes, upcoming appointments, etc.  Non-urgent messages can be sent to your provider as well.   To learn more about what you can do with MyChart, go to [email or web address].    Your next appointment:   12 month(s)  The format for your next appointment:   In Person  Provider:   Jayadeep Varanasi, MD     Other Instructions  High-Fiber Eating Plan Fiber, also called dietary fiber, is a type of carbohydrate. It is found foods such as fruits, vegetables, whole grains, and beans. A high-fiber diet can have many health benefits. Your health care provider may recommend a high-fiber diet  to help: Prevent constipation. Fiber can make your bowel movements more regular. Lower your cholesterol. Relieve the following conditions: Inflammation of veins in the anus (hemorrhoids). Inflammation of specific areas of the digestive tract (uncomplicated diverticulosis). A problem of the large intestine, also called the colon, that sometimes causes pain and diarrhea (irritable bowel syndrome, or IBS). Prevent overeating as part of a weight-loss plan. Prevent heart disease, type 2 diabetes, and certain cancers. What are tips for following this plan? Reading food labels  Check the nutrition facts label on food products for the amount of dietary fiber. Choose foods that have 5 grams of fiber or more per serving. The goals for recommended daily fiber intake include: Men (age 50 or younger): 34-38 g. Men (over age 50): 28-34 g. Women (age 50 or younger): 25-28 g. Women (over age 50): 22-25 g. Your daily fiber goal is _____________ g. Shopping Choose whole fruits and vegetables instead of processed forms, such as apple juice or applesauce. Choose a wide variety of high-fiber foods such as avocados, lentils, oats, and kidney beans. Read the nutrition facts label of the foods you choose. Be aware of foods with added fiber. These foods often have high sugar and sodium amounts per serving. Cooking Use whole-grain flour for baking and cooking. Cook with brown rice instead of white rice. Meal planning Start the day with a breakfast that is high in fiber, such as a cereal that contains 5 g of fiber or more per serving. Eat breads and cereals that are made with whole-grain flour instead of   refined flour or white flour. Eat brown rice, bulgur wheat, or millet instead of white rice. Use beans in place of meat in soups, salads, and pasta dishes. Be sure that half of the grains you eat each day are whole grains. General information You can get the recommended daily intake of dietary fiber  by: Eating a variety of fruits, vegetables, grains, nuts, and beans. Taking a fiber supplement if you are not able to take in enough fiber in your diet. It is better to get fiber through food than from a supplement. Gradually increase how much fiber you consume. If you increase your intake of dietary fiber too quickly, you may have bloating, cramping, or gas. Drink plenty of water to help you digest fiber. Choose high-fiber snacks, such as berries, raw vegetables, nuts, and popcorn. What foods should I eat? Fruits Berries. Pears. Apples. Oranges. Avocado. Prunes and raisins. Dried figs. Vegetables Sweet potatoes. Spinach. Kale. Artichokes. Cabbage. Broccoli. Cauliflower. Green peas. Carrots. Squash. Grains Whole-grain breads. Multigrain cereal. Oats and oatmeal. Brown rice. Barley. Bulgur wheat. Millet. Quinoa. Bran muffins. Popcorn. Rye wafer crackers. Meats and other proteins Navy beans, kidney beans, and pinto beans. Soybeans. Split peas. Lentils. Nuts and seeds. Dairy Fiber-fortified yogurt. Beverages Fiber-fortified soy milk. Fiber-fortified orange juice. Other foods Fiber bars. The items listed above may not be a complete list of recommended foods and beverages. Contact a dietitian for more information. What foods should I avoid? Fruits Fruit juice. Cooked, strained fruit. Vegetables Fried potatoes. Canned vegetables. Well-cooked vegetables. Grains White bread. Pasta made with refined flour. White rice. Meats and other proteins Fatty cuts of meat. Fried chicken or fried fish. Dairy Milk. Yogurt. Cream cheese. Sour cream. Fats and oils Butters. Beverages Soft drinks. Other foods Cakes and pastries. The items listed above may not be a complete list of foods and beverages to avoid. Talk with your dietitian about what choices are best for you. Summary Fiber is a type of carbohydrate. It is found in foods such as fruits, vegetables, whole grains, and beans. A high-fiber  diet has many benefits. It can help to prevent constipation, lower blood cholesterol, aid weight loss, and reduce your risk of heart disease, diabetes, and certain cancers. Increase your intake of fiber gradually. Increasing fiber too quickly may cause cramping, bloating, and gas. Drink plenty of water while you increase the amount of fiber you consume. The best sources of fiber include whole fruits and vegetables, whole grains, nuts, seeds, and beans. This information is not intended to replace advice given to you by your health care provider. Make sure you discuss any questions you have with your health care provider. Document Revised: 06/23/2019 Document Reviewed: 06/23/2019 Elsevier Patient Education  2022 [employer]   

## 2021-05-01 DIAGNOSIS — Z20822 Contact with and (suspected) exposure to covid-19: Secondary | ICD-10-CM | POA: Diagnosis not present

## 2021-05-02 DIAGNOSIS — M542 Cervicalgia: Secondary | ICD-10-CM | POA: Diagnosis not present

## 2021-05-02 DIAGNOSIS — Z79899 Other long term (current) drug therapy: Secondary | ICD-10-CM | POA: Diagnosis not present

## 2021-05-02 DIAGNOSIS — M549 Dorsalgia, unspecified: Secondary | ICD-10-CM | POA: Diagnosis not present

## 2021-05-02 DIAGNOSIS — M25551 Pain in right hip: Secondary | ICD-10-CM | POA: Diagnosis not present

## 2021-05-02 DIAGNOSIS — M25511 Pain in right shoulder: Secondary | ICD-10-CM | POA: Diagnosis not present

## 2021-05-02 DIAGNOSIS — Z6828 Body mass index (BMI) 28.0-28.9, adult: Secondary | ICD-10-CM | POA: Diagnosis not present

## 2021-05-02 DIAGNOSIS — M79644 Pain in right finger(s): Secondary | ICD-10-CM | POA: Diagnosis not present

## 2021-05-02 DIAGNOSIS — M545 Low back pain, unspecified: Secondary | ICD-10-CM | POA: Diagnosis not present

## 2021-05-03 ENCOUNTER — Encounter: Payer: Self-pay | Admitting: Family

## 2021-05-03 ENCOUNTER — Ambulatory Visit (INDEPENDENT_AMBULATORY_CARE_PROVIDER_SITE_OTHER): Payer: Medicare Other | Admitting: Family

## 2021-05-03 ENCOUNTER — Other Ambulatory Visit: Payer: Self-pay

## 2021-05-03 VITALS — BP 122/78 | HR 86 | Temp 98.3°F | Resp 18 | Ht 62.99 in | Wt 158.0 lb

## 2021-05-03 DIAGNOSIS — L723 Sebaceous cyst: Secondary | ICD-10-CM | POA: Insufficient documentation

## 2021-05-03 DIAGNOSIS — Z13228 Encounter for screening for other metabolic disorders: Secondary | ICD-10-CM

## 2021-05-03 DIAGNOSIS — L821 Other seborrheic keratosis: Secondary | ICD-10-CM | POA: Insufficient documentation

## 2021-05-03 DIAGNOSIS — Z1329 Encounter for screening for other suspected endocrine disorder: Secondary | ICD-10-CM

## 2021-05-03 DIAGNOSIS — L720 Epidermal cyst: Secondary | ICD-10-CM | POA: Insufficient documentation

## 2021-05-03 DIAGNOSIS — Z13820 Encounter for screening for osteoporosis: Secondary | ICD-10-CM

## 2021-05-03 DIAGNOSIS — Z23 Encounter for immunization: Secondary | ICD-10-CM | POA: Diagnosis not present

## 2021-05-03 DIAGNOSIS — Z0000 Encounter for general adult medical examination without abnormal findings: Secondary | ICD-10-CM

## 2021-05-03 DIAGNOSIS — D239 Other benign neoplasm of skin, unspecified: Secondary | ICD-10-CM | POA: Insufficient documentation

## 2021-05-03 DIAGNOSIS — Z113 Encounter for screening for infections with a predominantly sexual mode of transmission: Secondary | ICD-10-CM

## 2021-05-03 DIAGNOSIS — Z130 Encounter for screening for diseases of the blood and blood-forming organs and certain disorders involving the immune mechanism: Secondary | ICD-10-CM

## 2021-05-03 DIAGNOSIS — Z1231 Encounter for screening mammogram for malignant neoplasm of breast: Secondary | ICD-10-CM

## 2021-05-03 DIAGNOSIS — L700 Acne vulgaris: Secondary | ICD-10-CM | POA: Insufficient documentation

## 2021-05-03 DIAGNOSIS — Z1159 Encounter for screening for other viral diseases: Secondary | ICD-10-CM

## 2021-05-03 DIAGNOSIS — Z124 Encounter for screening for malignant neoplasm of cervix: Secondary | ICD-10-CM

## 2021-05-03 DIAGNOSIS — Z13220 Encounter for screening for lipoid disorders: Secondary | ICD-10-CM

## 2021-05-03 DIAGNOSIS — L669 Cicatricial alopecia, unspecified: Secondary | ICD-10-CM | POA: Insufficient documentation

## 2021-05-03 DIAGNOSIS — L219 Seborrheic dermatitis, unspecified: Secondary | ICD-10-CM | POA: Insufficient documentation

## 2021-05-03 NOTE — Progress Notes (Signed)
Subjective:   Courtney [name] is a 66 y.o. female who presents for Medicare Annual (Subsequent) preventive examination.  Review of Systems:    -  Reports two recent falls in February 2023. One fall while unboarding [employer]. The second fall 2 weeks later while on an elevator. Does have bruising of right side. Followed by Orthopedics and scheduled for MRI of left arm soon.   -  Bilateral upper leg pain and intermittent swelling. Denies redness, tenderness, shortness of breath, chest pain and additional red flag symptoms.  -  Planning to schedule appointment with Endocrinology soon.  -  Eye twitching. Denies change in vision. Eye exam up to date. Followed by Clent Jacks, MD.      Objective:    Vitals with BMI 05/03/2021 04/30/2021 02/19/2021  Height 5' 2.992" 5' 3"  -  Weight 158 lbs 161 lbs -  BMI 28 83.38 -  Systolic 250 539 767  Diastolic 78 80 77  Pulse 86 75 -   Physical Exam HENT:     Head: Normocephalic and atraumatic.     Right Ear: Tympanic membrane, ear canal and external ear normal.     Left Ear: Tympanic membrane, ear canal and external ear normal.  Eyes:     Extraocular Movements: Extraocular movements intact.     Conjunctiva/sclera: Conjunctivae normal.     Pupils: Pupils are equal, round, and reactive to light.  Cardiovascular:     Rate and Rhythm: Normal rate and regular rhythm.     Pulses: Normal pulses.     Heart sounds: Normal heart sounds.    No friction rub.  Pulmonary:     Effort: Pulmonary effort is normal.     Breath sounds: Normal breath sounds.  Musculoskeletal:     Cervical back: Normal range of motion and neck supple.  Neurological:     General: No focal deficit present.     Mental Status: She is alert and oriented to person, place, and time.  Psychiatric:        Mood and Affect: Mood normal.        Behavior: Behavior normal.    Advanced Directives 11/13/2020 01/31/2019 01/25/2019 04/30/2018 04/09/2017 04/02/2017 04/01/2017  Does Patient Have a  Medical Advance Directive? Yes Yes Yes Yes Yes Yes Yes  Type of Paramedic of [place];Living will;Out of facility DNR (pink MOST or yellow form) Healthcare Power of [place] will Living will Living will [place];Living will  Does patient want to make changes to medical advance directive? - - No - Patient declined No - Patient declined - No - Patient declined -  Copy of [place] in Chart? No - copy requested No - copy requested No - copy requested - No - copy requested No - copy requested -  Would patient like information on creating a medical advance directive? - - - - - - -    Current Medications (verified) Outpatient Encounter Medications as of 05/03/2021  Medication Sig   acetaminophen (TYLENOL) 500 MG tablet Take 1,000 mg by mouth every 8 (eight) hours as needed for headache.   alprazolam (XANAX) 2 MG tablet Take 2 mg by mouth 3 (three) times daily as needed for anxiety.    empagliflozin (JARDIANCE) 25 MG TABS tablet Take 1 tablet (25 mg total) by mouth daily.   glucose blood (FREESTYLE LITE) test strip 1 each by Other route in the morning and at bedtime. Use as instructed  insulin aspart protamine - aspart (NOVOLOG 70/30 MIX) (70-30) 100 UNIT/ML FlexPen Inject 18 Units into the skin daily with breakfast AND 20 Units daily with supper.   Insulin Pen Needle 32G X 4 MM MISC 1 Device by Does not apply route in the morning and at bedtime.   Lancets (FREESTYLE) lancets 1 Device by Misc.(Non-Drug; Combo Route) route daily. USED TO CHECK BLOOD SUGAR TID   loratadine (CLARITIN) 10 MG tablet Take by mouth.   Multiple Vitamin (MULTIVITAMIN) tablet Take 2 tablets by mouth daily. Gummies   naloxone (NARCAN) nasal spray 4 mg/0.1 mL Place 1 spray into the nose daily as needed (overdose).   oxyCODONE (ROXICODONE) 15 MG immediate release tablet Take 15 mg by mouth 5 (five) times daily as needed.    pantoprazole (PROTONIX) 40 MG tablet Take 1 tablet (40 mg total) by mouth daily.   [DISCONTINUED] sucralfate (CARAFATE) 1 g tablet Take 1 tablet (1 g total) by mouth 4 (four) times daily -  before meals and at bedtime. Dissolve 1 tablet in 2-4 oz of water, drink as slurry   No facility-administered encounter medications on file as of 05/03/2021.    Allergies (verified) Ibuprofen, Prednisone, Solu-medrol [methylprednisolone], Bactrim [sulfamethoxazole-trimethoprim], Biaxin [clarithromycin], Naproxen, and Sulfa antibiotics   History: Past Medical History:  Diagnosis Date   Abnormal ECG 07/18/2015   Arthritis    Asthma    Bowel obstruction (HCC)    Depression with anxiety 02/09/2015   mild xanax prn   Diabetes mellitus ([place]) 07/18/2015   type 2   Diverticulitis    Dysrhythmia    abnormal ekgs worked up   Essential tremor 03/31/2016   Fibromyalgia    Gait difficulty 02/09/2015   Gallstones    GERD (gastroesophageal reflux disease)    HLD (hyperlipidemia)    Hypersomnia 11/09/2015   Hypertension    no meds   Hyperthyroidism    patient states has no problems at present-01/25/2019   Hypothyroidism    patient states no problems at present 01/25/2019   IBS (irritable bowel syndrome)    Insomnia 02/09/2015   Migraines    MS (multiple sclerosis) ([place])    Multiple joint pain 03/31/2016   Numbness in both hands 03/18/2016   Other fatigue 02/09/2015   Peptic ulcer    Pneumonia    PONV (postoperative nausea and vomiting)    Urinary hesitancy 02/09/2015   Vision abnormalities    Past Surgical History:  Procedure Laterality Date   ABDOMINAL HYSTERECTOMY  1976   including oophorectomy, due to uterine cancer   [place]  01/31/2019   Procedure: BIOPSY;  Surgeon: Mauri Pole, MD;  Location: [facility];  Service: Endoscopy;;   CARPAL TUNNEL RELEASE Left    carpal tunnel surgery   CHOLECYSTECTOMY     2006   COLON SURGERY     SBO x 2   COLONOSCOPY WITH PROPOFOL  N/A 01/31/2019   Procedure: COLONOSCOPY WITH PROPOFOL;  Surgeon: Mauri Pole, MD;  Location: [facility];  Service: Endoscopy;  Laterality: N/A;   EUS N/A 04/09/2017   Procedure: UPPER ENDOSCOPIC ULTRASOUND (EUS) LINEAR;  Surgeon: Milus Banister, MD;  Location: [facility];  Service: Endoscopy;  Laterality: N/A;   HERNIA REPAIR     MENISCUS REPAIR Left    POLYPECTOMY  01/31/2019   Procedure: POLYPECTOMY;  Surgeon: Mauri Pole, MD;  Location: [facility];  Service: Endoscopy;;   SMALL INTESTINE SURGERY     toenail removal Right 12/2017  great toenail    Family History  Problem Relation Age of Onset   Heart disease Mother    Kidney disease Mother    Diabetes Mother    Other Father 51       murdered   Multiple sclerosis Daughter    Multiple sclerosis Other    Diabetes Sister    Alcohol abuse Brother        ETOH and marijuana   Diabetes Brother    Diabetes Sister    Diabetes Brother    ALS Brother    Social History   Socioeconomic History   Marital status: Married    Spouse name: Jimmie   Number of children: 4   Years of education: Not on file   Highest education level: 12th grade  Occupational History   Occupation: disabled  Tobacco Use   Smoking status: Former    Packs/day: 0.25    Years: 5.00    Pack years: 1.25    Types: Cigarettes    Quit date: 1990    Years since quitting: 33.1   Smokeless tobacco: Never  Vaping Use   Vaping Use: Never used  Substance and Sexual Activity   Alcohol use: Yes    Comment: rarely   Drug use: No   Sexual activity: Yes  Other Topics Concern   Not on file  Social History Narrative   Pt is right-handed. She lives with her husband in a 2 story house. Pt avoids caffeine. She and her husband go to the gym, lately once weekly, previously 3 times a week.    Social Determinants of Health   Financial Resource Strain: Not on file  Food Insecurity: Not on file  Transportation Needs: Not on file  Physical Activity:  Not on file  Stress: Not on file  Social Connections: Not on file    Tobacco Counseling Counseling given: not applicable  Clinical Intake:  Pre-visit preparation completed: Yes  Pain : 0-10 Pain Score: 0-No pain   Diabetes: Yes, followed by Endocrinology and planning to schedule appointment soon. CBG done?: No Did pt. bring in CBG monitor from home?: No  How often do you need to have someone help you when you read instructions, pamphlets, or other written materials from your doctor or pharmacy?: 1 - Never  Diabetic Yes   Interpreter Needed?: No   Activities of Daily Living In your present state of health, do you have any difficulty performing the following activities: 05/03/2021  Hearing? N  Vision? N  Difficulty concentrating or making decisions? N  Walking or climbing stairs? N  Dressing or bathing? N  Doing errands, shopping? N  Some recent data might be hidden    Patient Care Team: Camillia Herter, NP as PCP - General (Family Medicine) Jettie Booze, MD as PCP - Cardiology (Cardiology)  Indicate any recent Medical Services you may have received from other than [facility] providers in the past year (date may be approximate). None    Assessment:   This is a routine wellness examination for [place].  Hearing/Vision screen No results found.  Dietary issues and exercise activities discussed: yes   Goals Addressed: Would like to lose 10 pounds.   Depression Screen PHQ 2/9 Scores 05/04/2020 06/03/2016 01/08/2016  PHQ - 2 Score 0 0 0  Exception Documentation - - Other- indicate reason in comment box    Fall Risk Fall Risk  05/04/2020 08/03/2017 06/03/2016 01/08/2016  Falls in the past year? 2 Yes Yes Yes  Number falls in  past yr: 2 2 or more 2 or more 2 or more  Injury with Fall? 0 No No No  Risk Factor Category  - High Fall Risk High Fall Risk High Fall Risk  Risk for fall due to : No Fall Risks - Impaired balance/gait Impaired balance/gait;Other (Comment)  Risk for  fall due to: Comment - - - MS  Follow up Falls evaluation completed - - Education provided    FALL RISK PREVENTION PERTAINING TO THE HOME: Any stairs in or around the home? Yes  If so, are there any without handrails? No  Home free of loose throw rugs in walkways, pet beds, electrical cords, etc? Yes  Adequate lighting in your home to reduce risk of falls? Yes   ASSISTIVE DEVICES UTILIZED TO PREVENT FALLS: Life alert? No  Use of a cane, walker or w/c? No  Grab bars in the bathroom? No  Shower chair or bench in shower? No  Elevated toilet seat or a handicapped toilet? No   TIMED UP AND GO: Was the test performed? Yes .  Length of time to ambulate 10 feet: 8 seconds   Gait slow and steady without use of assistive device  Cognitive Function:   6CIT Screen 05/03/2021  What Year? 0 points  What month? 0 points  What time? 0 points  Count back from 20 0 points  Months in reverse 0 points  Repeat phrase 0 points  Total Score 0    Immunizations Immunization History  Administered Date(s) Administered   Influenza, Seasonal, Injecte, Preservative Fre 12/23/2016   Influenza,inj,Quad PF,6+ Mos 10/27/2018, 11/21/2019   Influenza-Unspecified 11/21/2015, 12/01/2017, 11/09/2020   PFIZER(Purple Top)SARS-COV-2 Vaccination 05/10/2019, 06/01/2019, 03/17/2021   Pneumococcal Polysaccharide-23 11/25/2017   Tdap 11/21/2019   Zoster Recombinat (Shingrix) 01/13/2018, 10/27/2018    TDAP status: Up to date  Flu Vaccine status: Up to date  Pneumococcal vaccine status: Completed during today's visit.  Covid-19 vaccine status: Completed vaccines  Qualifies for Shingles Vaccine? Yes   Zostavax completed Yes   Shingrix Completed?: Yes  Screening Tests Health Maintenance  Topic Date Due   FOOT EXAM  Never done   MAMMOGRAM  09/30/2017   Pneumonia Vaccine 46+ Years old (2 - PCV) 11/26/2018   DEXA SCAN  Never done   COVID-19 Vaccine (4 - Booster for Pfizer series) 05/12/2021   HEMOGLOBIN  A1C  07/28/2021   URINE MICROALBUMIN  10/19/2021   OPHTHALMOLOGY EXAM  02/05/2022   COLONOSCOPY (Pts 45-93yr Insurance coverage will need to be confirmed)  01/30/2029   TETANUS/TDAP  11/20/2029   INFLUENZA VACCINE  Completed   Hepatitis C Screening  Completed   Zoster Vaccines- Shingrix  Completed   HPV VACCINES  Aged Out    Health Maintenance  Health Maintenance Due  Topic Date Due   FOOT EXAM  Never done   MAMMOGRAM  09/30/2017   Pneumonia Vaccine 67 Years old (2 - PCV) 11/26/2018   DEXA SCAN  Never done    Colorectal cancer screening: Type of screening: Colonoscopy. Completed 01/31/2019. Repeat every 10 years  Mammogram status: Completed 11/20/2020. Repeat every year  Bone Density status: Ordered 05/03/2021. Pt provided with contact info and advised to call to schedule appt.  Lung Cancer Screening: (Low Dose CT Chest recommended if Age 67-80years, 30 pack-year currently smoking OR have quit w/in 15years.) does not qualify.   Lung Cancer Screening Referral: not applicable Additional Screening:  Hepatitis C Screening: does qualify; Completed 11/21/2019  Vision Screening: Recommended annual ophthalmology exams for  early detection of glaucoma and other disorders of the eye. Is the patient up to date with their annual eye exam?  Yes  Who is the provider or what is the name of the office in which the patient attends annual eye exams? Clent Jacks, MD If pt is not established with a provider, would they like to be referred to a provider to establish care? N/A.   Dental Screening: Recommended annual dental exams for proper oral hygiene  Community Resource Referral / Chronic Care Management: CRR required this visit?  No   CCM required this visit?  No     Plan:  1. Medicare annual wellness visit, subsequent: - Counseled on 150 minutes of exercise per week as tolerated, healthy eating (including decreased daily intake of saturated fats, cholesterol, added sugars, sodium),  STI prevention, and routine healthcare maintenance.  2. Screening for metabolic disorder: - [id number] to check kidney function, liver function, and electrolyte balance.  - CMP14+EGFR  3. Thyroid disorder screen: - TSH to check thyroid function.  - TSH  4. Screening cholesterol level: - Lipid panel to screen for high cholesterol.  - Lipid panel  5. Encounter for screening mammogram for malignant neoplasm of breast: - Referral for breast cancer screening by mammogram.  - MM Digital Screening; Future  6. Pap smear for cervical cancer screening: 7. Routine screening for STI (sexually transmitted infection): - Patient declined.   8. Osteoporosis screening: - Patient provided with contact information.  9. Need for 23-polyvalent pneumococcal polysaccharide vaccine: - Administered today in office. - Pneumococcal polysaccharide vaccine 23-valent greater than or equal to 2yo subcutaneous/IM   I have personally reviewed and noted the following in the patients chart:   Medical and social history Use of alcohol, tobacco or illicit drugs  Current medications and supplements including opioid prescriptions.  Functional ability and status Nutritional status Physical activity Advanced directives List of other physicians Hospitalizations, surgeries, and ER visits in previous 12 months Vitals Screenings to include cognitive, depression, and falls Referrals and appointments  In addition, I have reviewed and discussed with patient certain preventive protocols, quality metrics, and best practice recommendations. A written personalized care plan for preventive services as well as general preventive health recommendations were provided to patient.     Durene Fruits, NP  05/03/2021

## 2021-05-03 NOTE — Progress Notes (Signed)
Subjective:   Courtney [name] is a 66 y.o. female who presents for Medicare Annual (Subsequent) preventive examination.  Review of Systems           Objective:    Today's Vitals   05/03/21 1104  BP: 122/78  Pulse: 86  Resp: 18  Temp: 98.3 F (36.8 C)  SpO2: 95%  Weight: 158 lb (71.7 kg)  Height: 5' 2.99" (1.6 m)  PainSc: 0-No pain   Body mass index is 28 kg/m.  Advanced Directives 11/13/2020 01/31/2019 01/25/2019 04/30/2018 04/09/2017 04/02/2017 04/01/2017  Does Patient Have a Medical Advance Directive? Yes Yes Yes Yes Yes Yes Yes  Type of Paramedic of [place];Living will;Out of facility DNR (pink MOST or yellow form) Healthcare Power of [place] will Living will Living will [place];Living will  Does patient want to make changes to medical advance directive? - - No - Patient declined No - Patient declined - No - Patient declined -  Copy of [place] in Chart? No - copy requested No - copy requested No - copy requested - No - copy requested No - copy requested -  Would patient like information on creating a medical advance directive? - - - - - - -    Current Medications (verified) Outpatient Encounter Medications as of 05/03/2021  Medication Sig   acetaminophen (TYLENOL) 500 MG tablet Take 1,000 mg by mouth every 8 (eight) hours as needed for headache.   alprazolam (XANAX) 2 MG tablet Take 2 mg by mouth 3 (three) times daily as needed for anxiety.    empagliflozin (JARDIANCE) 25 MG TABS tablet Take 1 tablet (25 mg total) by mouth daily.   glucose blood (FREESTYLE LITE) test strip 1 each by Other route in the morning and at bedtime. Use as instructed   insulin aspart protamine - aspart (NOVOLOG 70/30 MIX) (70-30) 100 UNIT/ML FlexPen Inject 18 Units into the skin daily with breakfast AND 20 Units daily with supper.   Insulin Pen Needle 32G X 4 MM MISC 1 Device by Does not apply  route in the morning and at bedtime.   Lancets (FREESTYLE) lancets 1 Device by Misc.(Non-Drug; Combo Route) route daily. USED TO CHECK BLOOD SUGAR TID   loratadine (CLARITIN) 10 MG tablet Take by mouth.   Multiple Vitamin (MULTIVITAMIN) tablet Take 2 tablets by mouth daily. Gummies   naloxone (NARCAN) nasal spray 4 mg/0.1 mL Place 1 spray into the nose daily as needed (overdose).   oxyCODONE (ROXICODONE) 15 MG immediate release tablet Take 15 mg by mouth 5 (five) times daily as needed.   pantoprazole (PROTONIX) 40 MG tablet Take 1 tablet (40 mg total) by mouth daily.   [DISCONTINUED] sucralfate (CARAFATE) 1 g tablet Take 1 tablet (1 g total) by mouth 4 (four) times daily -  before meals and at bedtime. Dissolve 1 tablet in 2-4 oz of water, drink as slurry   No facility-administered encounter medications on file as of 05/03/2021.    Allergies (verified) Ibuprofen, Prednisone, Solu-medrol [methylprednisolone], Bactrim [sulfamethoxazole-trimethoprim], Biaxin [clarithromycin], Naproxen, and Sulfa antibiotics   History: Past Medical History:  Diagnosis Date   Abnormal ECG 07/18/2015   Arthritis    Asthma    Bowel obstruction (HCC)    Depression with anxiety 02/09/2015   mild xanax prn   Diabetes mellitus ([place]) 07/18/2015   type 2   Diverticulitis    Dysrhythmia    abnormal ekgs worked up  Essential tremor 03/31/2016   Fibromyalgia    Gait difficulty 02/09/2015   Gallstones    GERD (gastroesophageal reflux disease)    HLD (hyperlipidemia)    Hypersomnia 11/09/2015   Hypertension    no meds   Hyperthyroidism    patient states has no problems at present-01/25/2019   Hypothyroidism    patient states no problems at present 01/25/2019   IBS (irritable bowel syndrome)    Insomnia 02/09/2015   Migraines    MS (multiple sclerosis) ([place])    Multiple joint pain 03/31/2016   Numbness in both hands 03/18/2016   Other fatigue 02/09/2015   Peptic ulcer    Pneumonia    PONV (postoperative nausea  and vomiting)    Urinary hesitancy 02/09/2015   Vision abnormalities    Past Surgical History:  Procedure Laterality Date   ABDOMINAL HYSTERECTOMY  1976   including oophorectomy, due to uterine cancer   [place]  01/31/2019   Procedure: BIOPSY;  Surgeon: Mauri Pole, MD;  Location: [facility];  Service: Endoscopy;;   CARPAL TUNNEL RELEASE Left    carpal tunnel surgery   CHOLECYSTECTOMY     2006   COLON SURGERY     SBO x 2   COLONOSCOPY WITH PROPOFOL N/A 01/31/2019   Procedure: COLONOSCOPY WITH PROPOFOL;  Surgeon: Mauri Pole, MD;  [facility];  Service: Endoscopy;  Laterality: N/A;   EUS N/A 04/09/2017   Procedure: UPPER ENDOSCOPIC ULTRASOUND (EUS) LINEAR;  Surgeon: Milus Banister, MD;  [facility];  Service: Endoscopy;  Laterality: N/A;   HERNIA REPAIR     MENISCUS REPAIR Left    POLYPECTOMY  01/31/2019   Procedure: POLYPECTOMY;  Surgeon: Mauri Pole, MD;  [facility];  Service: Endoscopy;;   SMALL INTESTINE SURGERY     toenail removal Right 12/2017   great toenail    Family History  Problem Relation Age of Onset   Heart disease Mother    Kidney disease Mother    Diabetes Mother    Other Father 32       murdered   Multiple sclerosis Daughter    Multiple sclerosis Other    Diabetes Sister    Alcohol abuse Brother        ETOH and marijuana   Diabetes Brother    Diabetes Sister    Diabetes Brother    ALS Brother    Social History   Socioeconomic History   Marital status: Married    Spouse name: Jimmie   Number of children: 4   Years of education: Not on file   Highest education level: 12th grade  Occupational History   Occupation: disabled  Tobacco Use   Smoking status: Former    Packs/day: 0.25    Years: 5.00    Pack years: 1.25    Types: Cigarettes    Quit date: 1990    Years since quitting: 33.1   Smokeless tobacco: Never  Vaping Use   Vaping Use: Never used  Substance and  Sexual Activity   Alcohol use: Yes    Comment: rarely   Drug use: No   Sexual activity: Yes  Other Topics Concern   Not on file  Social History Narrative   Pt is right-handed. She lives with her husband in a 2 story house. Pt avoids caffeine. She and her husband go to the gym, lately once weekly, previously 3 times a week.    Social Determinants of Health  Financial Resource Strain: Not on file  Food Insecurity: Not on file  Transportation Needs: Not on file  Physical Activity: Not on file  Stress: Not on file  Social Connections: Not on file    Tobacco Counseling Counseling given: Not Answered   Clinical Intake:  Pre-visit preparation completed: Yes  Pain : 0-10 Pain Score: 0-No pain     Diabetes: Yes CBG done?: No Did pt. bring in CBG monitor from home?: No  How often do you need to have someone help you when you read instructions, pamphlets, or other written materials from your doctor or pharmacy?: 1 - Never  Diabetic Yes   Interpreter Needed?: No      Activities of Daily Living In your present state of health, do you have any difficulty performing the following activities: 05/03/2021  Hearing? N  Vision? N  Difficulty concentrating or making decisions? N  Walking or climbing stairs? N  Dressing or bathing? N  Doing errands, shopping? N  Some recent data might be hidden    Patient Care Team: Camillia Herter, NP as PCP - General (Nurse Practitioner) Jettie Booze, MD as PCP - Cardiology (Cardiology)  Indicate any recent Medical Services you may have received from other than [facility] providers in the past year (date may be approximate).     Assessment:   This is a routine wellness examination for Day.  Hearing/Vision screen No results found.  Dietary issues and exercise activities discussed:     Goals Addressed   None   Depression Screen PHQ 2/9 Scores 05/03/2021 05/04/2020 06/03/2016 01/08/2016  PHQ - 2 Score 0 0 0 0  Exception  Documentation - - - Other- indicate reason in comment box    Fall Risk Fall Risk  05/03/2021 05/04/2020 08/03/2017 06/03/2016 01/08/2016  Falls in the past year? 1 0 Yes Yes Yes  Number falls in past yr: 0 0 2 or more 2 or more 2 or more  Injury with Fall? 0 0 No No No  Risk Factor Category  - - High Fall Risk High Fall Risk High Fall Risk  Risk for fall due to : Impaired balance/gait No Fall Risks - Impaired balance/gait Impaired balance/gait;Other (Comment)  Risk for fall due to: Comment - - - - MS  Follow up Falls evaluation completed Falls evaluation completed - - Education provided    FALL RISK PREVENTION PERTAINING TO THE HOME:  Any stairs in or around the home? Yes  If so, are there any without handrails? No  Home free of loose throw rugs in walkways, pet beds, electrical cords, etc? Yes  Adequate lighting in your home to reduce risk of falls? Yes   ASSISTIVE DEVICES UTILIZED TO PREVENT FALLS:  Life alert? No  Use of a cane, walker or w/c? No  Grab bars in the bathroom? No  Shower chair or bench in shower? No  Elevated toilet seat or a handicapped toilet? No   TIMED UP AND GO:  Was the test performed? No .  Length of time to ambulate 10 feet: N/A sec.   Gait slow and steady without use of assistive device  Cognitive Function:     6CIT Screen 05/03/2021  What Year? 0 points  What month? 0 points  What time? 0 points  Count back from 20 0 points  Months in reverse 0 points  Repeat phrase 0 points  Total Score 0    Immunizations Immunization History  Administered Date(s) Administered   Influenza, Seasonal, Injecte, Preservative  Fre 12/23/2016   Influenza,inj,Quad PF,6+ Mos 10/27/2018, 11/21/2019   Influenza-Unspecified 11/21/2015, 12/01/2017, 11/09/2020   PFIZER(Purple Top)SARS-COV-2 Vaccination 05/10/2019, 06/01/2019, 03/17/2021   Pneumococcal Polysaccharide-23 11/25/2017, 05/03/2021   Tdap 11/21/2019   Zoster Recombinat (Shingrix) 01/13/2018, 10/27/2018    TDAP  status: Up to date  Flu Vaccine status: Up to date  Pneumococcal vaccine status: Completed during today's visit.  Covid-19 vaccine status: Completed vaccines  Qualifies for Shingles Vaccine? Yes   Zostavax completed Yes   Shingrix Completed?: Yes  Screening Tests Health Maintenance  Topic Date Due   FOOT EXAM  Never done   MAMMOGRAM  09/30/2017   DEXA SCAN  Never done   COVID-19 Vaccine (4 - Booster for Pfizer series) 05/12/2021   HEMOGLOBIN A1C  07/28/2021   URINE MICROALBUMIN  10/19/2021   OPHTHALMOLOGY EXAM  02/05/2022   Pneumonia Vaccine 62+ Years old (2 - PCV) 05/04/2022   COLONOSCOPY (Pts 45-78yrs Insurance coverage will need to be confirmed)  01/30/2029   TETANUS/TDAP  11/20/2029   INFLUENZA VACCINE  Completed   Hepatitis C Screening  Completed   Zoster Vaccines- Shingrix  Completed   HPV VACCINES  Aged Out    Health Maintenance  Health Maintenance Due  Topic Date Due   FOOT EXAM  Never done   MAMMOGRAM  09/30/2017   DEXA SCAN  Never done    Colorectal cancer screening: Type of screening: Colonoscopy. Completed 01/31/2019. Repeat every 10 years  Mammogram status: Completed 11/20/2020. Repeat every year  Bone Density status: Ordered 05/03/2021. Pt provided with contact info and advised to call to schedule appt.  Lung Cancer Screening: (Low Dose CT Chest recommended if Age 66-80 years, 30 pack-year currently smoking OR have quit w/in 15years.) does not qualify.   Lung Cancer Screening Referral: n/A  Additional Screening:  Hepatitis C Screening: does qualify; Completed 11/21/2019  Vision Screening: Recommended annual ophthalmology exams for early detection of glaucoma and other disorders of the eye. Is the patient up to date with their annual eye exam?  Yes  Who is the provider or what is the name of the office in which the patient attends annual eye exams? Dr. Clent Jacks  If pt is not established with a provider, would they like to be referred to a  provider to establish care?  N/A .   Dental Screening: Recommended annual dental exams for proper oral hygiene  Community Resource Referral / Chronic Care Management: CRR required this visit?  No   CCM required this visit?  No      Plan:     I have personally reviewed and noted the following in the patients chart:   Medical and social history Use of alcohol, tobacco or illicit drugs  Current medications and supplements including opioid prescriptions.  Functional ability and status Nutritional status Physical activity Advanced directives List of other physicians Hospitalizations, surgeries, and ER visits in previous 12 months Vitals Screenings to include cognitive, depression, and falls Referrals and appointments  In addition, I have reviewed and discussed with patient certain preventive protocols, quality metrics, and best practice recommendations. A written personalized care plan for preventive services as well as general preventive health recommendations were provided to patient.     Camillia Herter, NP   05/03/2021   Nurse Notes: N/A

## 2021-05-04 LAB — CMP14+EGFR
ALT: 10 IU/L (ref 0–32)
AST: 17 IU/L (ref 0–40)
Albumin/Globulin Ratio: 2.2 (ref 1.2–2.2)
Albumin: 5.1 g/dL — ABNORMAL HIGH (ref 3.8–4.8)
Alkaline Phosphatase: 91 IU/L (ref 44–121)
BUN/Creatinine Ratio: 26 (ref 12–28)
BUN: 27 mg/dL (ref 8–27)
Bilirubin Total: 0.5 mg/dL (ref 0.0–1.2)
CO2: 22 mmol/L (ref 20–29)
Calcium: 9.7 mg/dL (ref 8.7–10.3)
Chloride: 102 mmol/L (ref 96–106)
Creatinine, Ser: 1.05 mg/dL — ABNORMAL HIGH (ref 0.57–1.00)
Globulin, Total: 2.3 g/dL (ref 1.5–4.5)
Glucose: 160 mg/dL — ABNORMAL HIGH (ref 70–99)
Potassium: 4.3 mmol/L (ref 3.5–5.2)
Sodium: 143 mmol/L (ref 134–144)
Total Protein: 7.4 g/dL (ref 6.0–8.5)
eGFR: 59 mL/min/{1.73_m2} — ABNORMAL LOW (ref >59)

## 2021-05-04 LAB — LIPID PANEL
Chol/HDL Ratio: 2.5 ratio (ref 0.0–4.4)
Cholesterol, Total: 197 mg/dL (ref 100–199)
HDL: 79 mg/dL (ref >39)
LDL Chol Calc (NIH): 89 mg/dL (ref 0–99)
Triglycerides: 177 mg/dL — ABNORMAL HIGH (ref 0–149)
VLDL Cholesterol Cal: 29 mg/dL (ref 5–40)

## 2021-05-04 LAB — TSH: TSH: 1.62 u[IU]/mL (ref 0.450–4.500)

## 2021-05-04 NOTE — Progress Notes (Signed)
The following abnormalities are noted:   ?-  Triglycerides (a form of cholesterol) higher than expected. High cholesterol may increase risk of heart attack and/or stroke. Consider eating more fruits, vegetables, and lean baked meats such as chicken or fish. Moderate intensity exercise at least 150 minutes as tolerated per week may help as well.  ?-  Kidney function lower than normal but improved since 5 months ago. ? ?All other values are normal, stable or within acceptable limits. ? ?Medication changes / Follow up labs / Other changes or recommendations:   ?-  Recheck fasting cholesterol routinely. ?-  Recheck kidney function in 3 months or at next appointment with Endocrinologist whichever is sooner.  ? ?Camillia Herter, NP 05/04/2021 7:08 AM  ? ?

## 2021-05-06 ENCOUNTER — Encounter: Payer: Self-pay | Admitting: Internal Medicine

## 2021-05-15 ENCOUNTER — Ambulatory Visit
Admission: RE | Admit: 2021-05-15 | Discharge: 2021-05-15 | Disposition: A | Discharge status: Home / Self Care | Payer: Medicare Other | Source: Ambulatory Visit | Attending: Physician Assistant | Admitting: Physician Assistant

## 2021-05-15 DIAGNOSIS — M7989 Other specified soft tissue disorders: Secondary | ICD-10-CM | POA: Diagnosis not present

## 2021-05-15 DIAGNOSIS — M25522 Pain in left elbow: Secondary | ICD-10-CM | POA: Diagnosis not present

## 2021-05-21 ENCOUNTER — Encounter: Payer: Self-pay | Admitting: Orthopaedic Surgery

## 2021-05-21 ENCOUNTER — Ambulatory Visit (INDEPENDENT_AMBULATORY_CARE_PROVIDER_SITE_OTHER): Payer: Medicare Other | Admitting: Orthopaedic Surgery

## 2021-05-21 ENCOUNTER — Other Ambulatory Visit: Payer: Self-pay

## 2021-05-21 DIAGNOSIS — M25522 Pain in left elbow: Secondary | ICD-10-CM | POA: Diagnosis not present

## 2021-05-21 NOTE — Progress Notes (Signed)
Patient ID: Courtney [name], female    DOB: [birth year]  MRN: [id number]  CC: Insomnia   Subjective: Courtney [name] is a 66 y.o. female who presents for insomnia.   Her concerns today include:  INSOMNIA: Persisting for years. Has tried several medications including but not limited to Ambien and Trazodone without success. She does not overindulge in caffeine. Denies related to anxiety or any recent significant life events.   2. FIBROMYALGIA: History of fibromyalgia. Having intermittent swelling of hands. Recently seen by Orthopedics for right and left elbow pain, left shoulder pain, left wrist pain, and left hand pain. Reports recent imaging of right elbow essentially normal excluding tendon and ligament issues. Reports was told from Orthopedics there is nothing more they can offer currently and no plan for surgery. At that time recommended referral to specialist for management of fibromyalgia as probable cause. Patient reports she was followed by Rheumatology in the past and thinks they were even considering if she had multiple sclerosis.   3. THYROID CONCERN: Reports had thyroid ultrasound last year and thinks time for renewal.   4. DRY NOSE: Persisting for weeks. Has tried humidifier, neti pot, petroleum with Vicks Vapor and additional natural remedies without success. Has scant yellow drainage. Denies additional upper respiratory symptoms.    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   Sebaceous cyst 05/03/2021   Seborrheic dermatitis of scalp 05/03/2021   Biceps tendonitis on left 04/30/2021   Stage 3a chronic kidney disease (HCC) 10/21/2020   Type 2 diabetes mellitus with stage 3a chronic kidney disease, with long-term current use of insulin (HCC) 10/21/2020   Type 2 diabetes mellitus with diabetic polyneuropathy, with  long-term current use of insulin (HCC) 06/20/2020   Dyslipidemia, goal LDL below 70 04/10/2020   Episode of recurrent major depressive disorder (HCC) 04/10/2020   Pain in left shoulder 04/04/2020   Vaginal dryness, menopausal 07/07/2019   Tremor 05/30/2019   Memory loss 05/03/2019   History of colonic polyps 03/16/2019   Fracture of distal phalanx of right ring finger 03/15/2019   Facet arthritis, degenerative, 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   Polyp of transverse colon    Special screening for malignant neoplasms, colon    Polyp of sigmoid colon    Anxiety 09/09/2018   GERD (gastroesophageal reflux disease) 09/09/2018   Cervicalgia 08/10/2018   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   Laryngopharyngeal reflux (LPR) 05/14/2018   Loss of weight    RLQ abdominal pain 03/27/2017   Bloating 03/27/2017   Abnormal finding on imaging 03/27/2017   Dilation of biliary tract 03/27/2017   Nausea 03/27/2017   Chronic sinusitis 03/19/2017   Multiple joint pain 03/31/2016   Essential tremor 03/31/2016   Numbness in both hands 03/18/2016   Hypersomnia 11/09/2015   Diabetes mellitus (HCC) 07/18/2015   Abnormal ECG 07/18/2015   Gait difficulty 02/09/2015   Chronic left shoulder pain 02/09/2015   Generalized anxiety disorder 02/09/2015   Numbness 02/09/2015   Insomnia 02/09/2015   Urinary hesitancy 02/09/2015   Other fatigue 02/09/2015   Fibromyalgia 02/09/2015   Essential hypertension 09/01/2011   Coronary artery disease involving native coronary artery of native heart without angina pectoris 09/01/2011   MS (multiple sclerosis) (HCC) 09/01/2011   Parkinson's disease (HCC) 09/01/2011   Type 2 diabetes mellitus without complications (HCC) 09/01/2011  Current Outpatient Medications on File Prior to Visit  Medication Sig Dispense Refill   acetaminophen (TYLENOL) 500 MG tablet Take 1,000 mg by mouth every 8 (eight) hours as needed for headache.     alprazolam (XANAX) 2 MG tablet Take  2 mg by mouth 3 (three) times daily as needed for anxiety.      cyclobenzaprine (FLEXERIL) 5 MG tablet Take 5 mg by mouth every 4 (four) hours as needed.     empagliflozin (JARDIANCE) 25 MG TABS tablet Take 1 tablet (25 mg total) by mouth daily. 90 tablet 3   glucose blood (FREESTYLE LITE) test strip 1 each by Other route in the morning and at bedtime. Use as instructed 200 each 3   insulin aspart protamine - aspart (NOVOLOG 70/30 MIX) (70-30) 100 UNIT/ML FlexPen Inject 18 Units into the skin daily with breakfast AND 20 Units daily with supper. 30 mL 3   Insulin Pen Needle 32G X 4 MM MISC 1 Device by Does not apply route in the morning and at bedtime. 200 each 3   Lancets (FREESTYLE) lancets 1 Device by Misc.(Non-Drug; Combo Route) route daily. USED TO CHECK BLOOD SUGAR TID     loratadine (CLARITIN) 10 MG tablet Take by mouth.     Multiple Vitamin (MULTIVITAMIN) tablet Take 2 tablets by mouth daily. Gummies     naloxone (NARCAN) nasal spray 4 mg/0.1 mL Place 1 spray into the nose daily as needed (overdose).     pantoprazole (PROTONIX) 40 MG tablet Take 1 tablet (40 mg total) by mouth daily. 30 tablet 11   [DISCONTINUED] sucralfate (CARAFATE) 1 g tablet Take 1 tablet (1 g total) by mouth 4 (four) times daily -  before meals and at bedtime. Dissolve 1 tablet in 2-4 oz of water, drink as slurry 120 tablet 1   No current facility-administered medications on file prior to visit.    Allergies  Allergen Reactions   Ibuprofen Other (See Comments)    Developed a ulcer  Other reaction(s): Other (See Comments) Developed a ulcer    Prednisone Anaphylaxis, Shortness Of Breath and Palpitations   Solu-Medrol [Methylprednisolone] Anaphylaxis, Shortness Of Breath and Palpitations   Bactrim [Sulfamethoxazole-Trimethoprim] Other (See Comments)    "blotchiness" redness, face swelling   Biaxin [Clarithromycin] Swelling   Naproxen Other (See Comments)    Developed a ulcer    Sulfa Antibiotics Other (See  Comments)    Other reaction(s): Angioedema Other reaction(s): Angioedema    Social History   Socioeconomic History   Marital status: Married    Spouse name: Jimmie   Number of children: 4   Years of education: Not on file   Highest education level: 12th grade  Occupational History   Occupation: disabled  Tobacco Use   Smoking status: Former    Packs/day: 0.25    Years: 5.00    Pack years: 1.25    Types: Cigarettes    Quit date: 1990    Years since quitting: 33.2   Smokeless tobacco: Never  Vaping Use   Vaping Use: Never used  Substance and Sexual Activity   Alcohol use: Yes    Comment: rarely   Drug use: No   Sexual activity: Yes  Other Topics Concern   Not on file  Social History Narrative   Pt is right-handed. She lives with her husband in a 2 story house. Pt avoids caffeine. She and her husband go to the gym, lately once weekly, previously 3 times a week.    Social Determinants  of Health   Financial Resource Strain: Not on file  Food Insecurity: Not on file  Transportation Needs: Not on file  Physical Activity: Not on file  Stress: Not on file  Social Connections: Not on file  Intimate Partner Violence: Not on file    Family History  Problem Relation Age of Onset   Heart disease Mother    Kidney disease Mother    Diabetes Mother    Other Father 22       murdered   Multiple sclerosis Daughter    Multiple sclerosis Other    Diabetes Sister    Alcohol abuse Brother        ETOH and marijuana   Diabetes Brother    Diabetes Sister    Diabetes Brother    ALS Brother     Past Surgical History:  Procedure Laterality Date   ABDOMINAL HYSTERECTOMY  1976   including oophorectomy, due to uterine cancer   APPENDECTOMY  1974   BIOPSY  01/31/2019   Procedure: BIOPSY;  Surgeon: Napoleon Form, MD;  Location: [facility];  Service: Endoscopy;;   CARPAL TUNNEL RELEASE Left    carpal tunnel surgery   CHOLECYSTECTOMY     2006   COLON SURGERY     SBO x  2   COLONOSCOPY WITH PROPOFOL N/A 01/31/2019   Procedure: COLONOSCOPY WITH PROPOFOL;  Surgeon: Napoleon Form, MD;  [facility];  Service: Endoscopy;  Laterality: N/A;   EUS N/A 04/09/2017   Procedure: UPPER ENDOSCOPIC ULTRASOUND (EUS) LINEAR;  Surgeon: Rachael Fee, MD;  [facility];  Service: Endoscopy;  Laterality: N/A;   HERNIA REPAIR     MENISCUS REPAIR Left    POLYPECTOMY  01/31/2019   Procedure: POLYPECTOMY;  Surgeon: Napoleon Form, MD;  [facility];  Service: Endoscopy;;   SMALL INTESTINE SURGERY     toenail removal Right 12/2017   great toenail     ROS: Review of Systems Negative except as stated above  PHYSICAL EXAM: BP 120/79 (BP Location: Left Arm, Patient Position: Sitting, Cuff Size: Large)   Pulse 86   Temp 98.3 F (36.8 C)   Resp 18   Ht 5\' 5"  (1.651 m)   Wt 160 lb (72.6 kg)   SpO2 95%   BMI 26.63 kg/m    Physical Exam HENT:     Head: Normocephalic and atraumatic.  Eyes:     Extraocular Movements: Extraocular movements intact.     Conjunctiva/sclera: Conjunctivae normal.     Pupils: Pupils are equal, round, and reactive to light.  Cardiovascular:     Rate and Rhythm: Normal rate and regular rhythm.     Pulses: Normal pulses.     Heart sounds: Normal heart sounds.  Pulmonary:     Effort: Pulmonary effort is normal.     Breath sounds: Normal breath sounds.  Musculoskeletal:     Cervical back: Normal range of motion and neck supple.  Lymphadenopathy:     Cervical: Cervical adenopathy present.  Neurological:     General: No focal deficit present.     Mental Status: She is alert and oriented to person, place, and time.  Psychiatric:        Mood and Affect: Mood normal.        Behavior: Behavior normal.   ASSESSMENT AND PLAN: 1. Insomnia, unspecified type: - Referral to Sleep Studies for further evaluation and management.  - Ambulatory referral to Sleep Studies  2. Fibromyalgia: - Referral to  Rheumatology for further evaluation and management.  - Ambulatory referral to Rheumatology  3. Dry nose: - Continue with humidifier and thin layer petroleum inside nasal canals.  - Follow-up with primary provider as scheduled.   4. Cervical lymphadenopathy: - Ultrasound soft tissue head/neck for further evaluation.  - US Soft Tissue Head/Neck (NON-THYROID); Future    Patient was given the opportunity to ask questions.  Patient verbalized understanding of the plan and was able to repeat key elements of the plan. Patient was given clear instructions to go to Emergency Department or return to medical center if symptoms don't improve, worsen, or new problems develop.The patient verbalized understanding.   Orders Placed This Encounter  Procedures   US Soft Tissue Head/Neck (NON-THYROID)   Ambulatory referral to Rheumatology   Ambulatory referral to Sleep Studies     Requested Prescriptions    No prescriptions requested or ordered in this encounter    Follow-up with primary provider as scheduled.   Rema Fendt, NP

## 2021-05-21 NOTE — Progress Notes (Addendum)
? ?Office Visit Note ?  ?Patient: Courtney [name]           ?Date of Birth: Jun 01, 1955           ?MRN: [id number] ?Visit Date: 05/21/2021 ?             ?Requested by: Camillia Herter, NP ?[facility] ?[address] ?[place],  [place] [address] ?PCP: Camillia Herter, NP ? ? ?Assessment & Plan: ?Visit Diagnoses: left elbow pain ? ?Plan: Courtney [name] comes in today to review the MRI of her left elbow.  She has pain as the insertion of the biceps.  She also has multiple other complaints including shoulder pain and neck pain.  She is quite frustrated as she does not seem to be able to get a handle on what is going on.  She does have a history of fibromyalgia and certainly this could be a flare of the fibromyalgia.  She is going to see her primary care doctor later this week.  She is also in pain management.  MRI reviewed which was normal.  We have recommended that she discuss this with her primary care doctor and make any changes to her chronic pain management.  She did try Voltaren gel was not very effective.  She says the 1 modality that seems to help her CBD oil and we have encouraged her to continue to use this ? ?Follow-Up Instructions: No follow-ups on file.  ? ?Orders:  ?No orders of the defined types were placed in this encounter. ? ?No orders of the defined types were placed in this encounter. ? ? ? ? Procedures: ?No procedures performed ? ? ?Clinical Data: ?No additional findings. ? ? ?Subjective: ?Chief Complaint  ?Patient presents with  ? Left Elbow - Follow-up  ?  MRI review  ?Patient presents today for follow up on her left elbow. She had an MRI and is here today for those results. ? ?HPI ? ?Review of Systems  ?All other systems reviewed and are negative. ? ? ?Objective: ?Vital Signs: There were no vitals taken for this visit. ? ?Physical Exam ?Constitutional:   ?   Appearance: Normal appearance.  ?Pulmonary:  ?   Effort: Pulmonary effort is normal.  ?Neurological:  ?   Mental Status: She is alert.  ? ? ?Ortho  Exam ?Examination distal CMS is intact.  She still has tenderness over the distal insertion of the biceps.  She she has pain with elevation of her arm pain with flexion extension of her neck.  No real radicular findings today.  MRI was reviewed to her which was negative for any chondral defects ligamentous injuries ?Specialty Comments:  ?No specialty comments available. ? ?Imaging: ?No results found. ? ? ?PMFS History: ?Patient Active Problem List  ? Diagnosis Date Noted  ? Comedone 05/03/2021  ? Dermatofibroma 05/03/2021  ? 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 Milia 05/03/2021  ? 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 Sebaceous cyst 05/03/2021  ? Seborrheic dermatitis of scalp 05/03/2021  ? Biceps tendonitis on left 04/30/2021  ? Stage 3a chronic kidney disease ([place]) 10/21/2020  ? Type 2 diabetes mellitus with stage 3a chronic kidney disease, with long-term current use of insulin ([place]) 10/21/2020  ? Type 2 diabetes mellitus with diabetic polyneuropathy, with long-term current use of insulin ([place]) 06/20/2020  ? Dyslipidemia, goal LDL below 70 04/10/2020  ? Episode of recurrent major depressive disorder ([place]) 04/10/2020  ? Pain in left shoulder 04/04/2020  ? Vaginal dryness, menopausal 07/07/2019  ?  Tremor 05/30/2019  ? Memory loss 05/03/2019  ? History of colonic polyps 03/16/2019  ? Fracture of distal phalanx of right ring finger 03/15/2019  ? Facet arthritis, degenerative, 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  ? Polyp of transverse colon   ? Special screening for malignant neoplasms, colon   ? Polyp of sigmoid colon   ? Anxiety 09/09/2018  ? GERD (gastroesophageal reflux disease) 09/09/2018  ? Cervicalgia 08/10/2018  ? 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  ? Laryngopharyngeal reflux (LPR) 05/14/2018  ? Loss of weight   ? RLQ abdominal pain 03/27/2017  ? Bloating 03/27/2017  ? Abnormal finding on imaging 03/27/2017  ? Dilation of biliary tract 03/27/2017  ? Nausea 03/27/2017  ? Chronic sinusitis 03/19/2017  ? Multiple  joint pain 03/31/2016  ? Essential tremor 03/31/2016  ? Numbness in both hands 03/18/2016  ? Hypersomnia 11/09/2015  ? Diabetes mellitus ([place]) 07/18/2015  ? Abnormal ECG 07/18/2015  ? Gait difficulty 02/09/2015  ? Chronic left shoulder pain 02/09/2015  ? Generalized anxiety disorder 02/09/2015  ? Numbness 02/09/2015  ? Insomnia 02/09/2015  ? Urinary hesitancy 02/09/2015  ? Other fatigue 02/09/2015  ? Fibromyalgia 02/09/2015  ? Essential hypertension 09/01/2011  ? Coronary artery disease involving native coronary artery of native heart without angina pectoris 09/01/2011  ? MS (multiple sclerosis) ([place]) 09/01/2011  ? Parkinson's disease ([place]) 09/01/2011  ? Type 2 diabetes mellitus without complications ([place]) 94/70/9628  ? ?Past Medical History:  ?Diagnosis Date  ? Abnormal ECG 07/18/2015  ? Arthritis   ? Asthma   ? Bowel obstruction ([place])   ? Depression with anxiety 02/09/2015  ? mild xanax prn  ? Diabetes mellitus ([place]) 07/18/2015  ? type 2  ? Diverticulitis   ? Dysrhythmia   ? abnormal ekgs worked up  ? Essential tremor 03/31/2016  ? Fibromyalgia   ? Gait difficulty 02/09/2015  ? Gallstones   ? GERD (gastroesophageal reflux disease)   ? HLD (hyperlipidemia)   ? Hypersomnia 11/09/2015  ? Hypertension   ? no meds  ? Hyperthyroidism   ? patient states has no problems at present-01/25/2019  ? Hypothyroidism   ? patient states no problems at present 01/25/2019  ? IBS (irritable bowel syndrome)   ? Insomnia 02/09/2015  ? Migraines   ? MS (multiple sclerosis) ([place])   ? Multiple joint pain 03/31/2016  ? Numbness in both hands 03/18/2016  ? Other fatigue 02/09/2015  ? Peptic ulcer   ? Pneumonia   ? PONV (postoperative nausea and vomiting)   ? Urinary hesitancy 02/09/2015  ? Vision abnormalities   ?  ?Family History  ?Problem Relation Age of Onset  ? Heart disease Mother   ? Kidney disease Mother   ? Diabetes Mother   ? Other Father 72  ?     murdered  ? Multiple sclerosis Daughter   ? Multiple sclerosis Other   ? Diabetes Sister   ?  Alcohol abuse Brother   ?     ETOH and marijuana  ? Diabetes Brother   ? Diabetes Sister   ? Diabetes Brother   ? ALS Brother   ?  ?Past Surgical History:  ?Procedure Laterality Date  ? ABDOMINAL HYSTERECTOMY  1976  ? including oophorectomy, due to uterine cancer  ? APPENDECTOMY  1974  ? BIOPSY  01/31/2019  ? Procedure: BIOPSY;  Surgeon: Mauri Pole, MD;  Location: [facility];  Service: Endoscopy;;  ? CARPAL TUNNEL RELEASE Left   ? carpal tunnel surgery  ? CHOLECYSTECTOMY    ?  2006  ? COLON SURGERY    ? SBO x 2  ? COLONOSCOPY WITH PROPOFOL N/A 01/31/2019  ? Procedure: COLONOSCOPY WITH PROPOFOL;  Surgeon: Mauri Pole, MD;  [facility];  Service: Endoscopy;  Laterality: N/A;  ? EUS N/A 04/09/2017  ? Procedure: UPPER ENDOSCOPIC ULTRASOUND (EUS) LINEAR;  Surgeon: Milus Banister, MD;  [facility];  Service: Endoscopy;  Laterality: N/A;  ? HERNIA REPAIR    ? MENISCUS REPAIR Left   ? POLYPECTOMY  01/31/2019  ? Procedure: POLYPECTOMY;  Surgeon: Mauri Pole, MD;  [facility];  Service: Endoscopy;;  ? SMALL INTESTINE SURGERY    ? toenail removal Right 12/2017  ? great toenail   ? ?Social History  ? ?Occupational History  ? Occupation: disabled  ?Tobacco Use  ? Smoking status: Former  ?  Packs/day: 0.25  ?  Years: 5.00  ?  Pack years: 1.25  ?  Types: Cigarettes  ?  Quit date: 88  ?  Years since quitting: 33.2  ? Smokeless tobacco: Never  ?Vaping Use  ? Vaping Use: Never used  ?Substance and Sexual Activity  ? Alcohol use: Yes  ?  Comment: rarely  ? Drug use: No  ? Sexual activity: Yes  ? ? ? ? ? ? ?

## 2021-05-24 ENCOUNTER — Other Ambulatory Visit: Payer: Self-pay

## 2021-05-24 ENCOUNTER — Ambulatory Visit (INDEPENDENT_AMBULATORY_CARE_PROVIDER_SITE_OTHER): Payer: Medicare Other | Admitting: Family

## 2021-05-24 VITALS — BP 120/79 | HR 86 | Temp 98.3°F | Resp 18 | Ht 65.0 in | Wt 160.0 lb

## 2021-05-24 DIAGNOSIS — M797 Fibromyalgia: Secondary | ICD-10-CM | POA: Diagnosis not present

## 2021-05-24 DIAGNOSIS — G4700 Insomnia, unspecified: Secondary | ICD-10-CM

## 2021-05-24 DIAGNOSIS — J3489 Other specified disorders of nose and nasal sinuses: Secondary | ICD-10-CM

## 2021-05-24 DIAGNOSIS — R590 Localized enlarged lymph nodes: Secondary | ICD-10-CM | POA: Diagnosis not present

## 2021-05-24 NOTE — Progress Notes (Signed)
Pt presents for insomnia and having some sinus pressure  ?Pt experiencing edema in hands and feet at bedtime  ?

## 2021-05-31 DIAGNOSIS — Z79899 Other long term (current) drug therapy: Secondary | ICD-10-CM | POA: Diagnosis not present

## 2021-05-31 DIAGNOSIS — M549 Dorsalgia, unspecified: Secondary | ICD-10-CM | POA: Diagnosis not present

## 2021-05-31 DIAGNOSIS — M542 Cervicalgia: Secondary | ICD-10-CM | POA: Diagnosis not present

## 2021-05-31 DIAGNOSIS — Z6828 Body mass index (BMI) 28.0-28.9, adult: Secondary | ICD-10-CM | POA: Diagnosis not present

## 2021-05-31 DIAGNOSIS — M25511 Pain in right shoulder: Secondary | ICD-10-CM | POA: Diagnosis not present

## 2021-05-31 DIAGNOSIS — M545 Low back pain, unspecified: Secondary | ICD-10-CM | POA: Diagnosis not present

## 2021-05-31 DIAGNOSIS — Z0000 Encounter for general adult medical examination without abnormal findings: Secondary | ICD-10-CM | POA: Diagnosis not present

## 2021-06-01 DIAGNOSIS — Z20822 Contact with and (suspected) exposure to covid-19: Secondary | ICD-10-CM | POA: Diagnosis not present

## 2021-06-03 DIAGNOSIS — F411 Generalized anxiety disorder: Secondary | ICD-10-CM | POA: Diagnosis not present

## 2021-06-03 DIAGNOSIS — G8929 Other chronic pain: Secondary | ICD-10-CM | POA: Diagnosis not present

## 2021-06-03 DIAGNOSIS — G4700 Insomnia, unspecified: Secondary | ICD-10-CM | POA: Diagnosis not present

## 2021-06-03 DIAGNOSIS — Z79899 Other long term (current) drug therapy: Secondary | ICD-10-CM | POA: Diagnosis not present

## 2021-06-03 NOTE — Progress Notes (Signed)
? ? ?Patient ID: Courtney [name], female    DOB: 04-27-1955  MRN: [id number] ? ?CC: Skin Concern  ? ?Subjective: ?Courtney [name] is a 66 y.o. female who presents for skin concern.  ? ?Her concerns today include:  ?Reports area of concern beneath umbilicus. Reports recently seen by pain management doctor and prescribed Kenalog and Bactroban. Has noticed scant yellow discharge while cleansing. Denies any other areas of skin concerns.  ? ?Reports frequent vaginal yeast infections secondary to diabetes. No need for refills of Diflucan as of present.  ? ?Requesting DEXA scan.  ? ?Also, would like referral to Plastic Surgery for tummy tuck. Wants to lose 10 pounds. Reports unsure why she is gaining weight because she monitors what she eats.  ? ?Has not received call to schedule thyroid ultrasound. ? ?Requesting referral to Gynecology for routine women's health exam.  ? ?Patient Active Problem List  ? Diagnosis Date Noted  ? Comedone 05/03/2021  ? Dermatofibroma 05/03/2021  ? 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 Milia 05/03/2021  ? 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 Sebaceous cyst 05/03/2021  ? Seborrheic dermatitis of scalp 05/03/2021  ? Biceps tendonitis on left 04/30/2021  ? Stage 3a chronic kidney disease ([place]) 10/21/2020  ? Type 2 diabetes mellitus with stage 3a chronic kidney disease, with long-term current use of insulin ([place]) 10/21/2020  ? Type 2 diabetes mellitus with diabetic polyneuropathy, with long-term current use of insulin ([place]) 06/20/2020  ? Dyslipidemia, goal LDL below 70 04/10/2020  ? Episode of recurrent major depressive disorder ([place]) 04/10/2020  ? Pain in left shoulder 04/04/2020  ? Vaginal dryness, menopausal 07/07/2019  ? Tremor 05/30/2019  ? Memory loss 05/03/2019  ? History of colonic polyps 03/16/2019  ? Fracture of distal phalanx of right ring finger 03/15/2019  ? Facet arthritis, degenerative, 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  ? Polyp of transverse colon   ? Special screening for malignant  neoplasms, colon   ? Polyp of sigmoid colon   ? Anxiety 09/09/2018  ? GERD (gastroesophageal reflux disease) 09/09/2018  ? Cervicalgia 08/10/2018  ? 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  ? Laryngopharyngeal reflux (LPR) 05/14/2018  ? Loss of weight   ? RLQ abdominal pain 03/27/2017  ? Bloating 03/27/2017  ? Abnormal finding on imaging 03/27/2017  ? Dilation of biliary tract 03/27/2017  ? Nausea 03/27/2017  ? Chronic sinusitis 03/19/2017  ? Multiple joint pain 03/31/2016  ? Essential tremor 03/31/2016  ? Numbness in both hands 03/18/2016  ? Hypersomnia 11/09/2015  ? Diabetes mellitus ([place]) 07/18/2015  ? Abnormal ECG 07/18/2015  ? Gait difficulty 02/09/2015  ? Chronic left shoulder pain 02/09/2015  ? Generalized anxiety disorder 02/09/2015  ? Numbness 02/09/2015  ? Insomnia 02/09/2015  ? Urinary hesitancy 02/09/2015  ? Other fatigue 02/09/2015  ? Fibromyalgia 02/09/2015  ? Essential hypertension 09/01/2011  ? Coronary artery disease involving native coronary artery of native heart without angina pectoris 09/01/2011  ? MS (multiple sclerosis) ([place]) 09/01/2011  ? Parkinson's disease ([place]) 09/01/2011  ? Type 2 diabetes mellitus without complications ([place]) 50/11/3816  ?  ? ?Current Outpatient Medications on File Prior to Visit  ?Medication Sig Dispense Refill  ? acetaminophen (TYLENOL) 500 MG tablet Take 1,000 mg by mouth every 8 (eight) hours as needed for headache.    ? alprazolam (XANAX) 2 MG tablet Take 2 mg by mouth 3 (three) times daily as needed for anxiety.     ? cyclobenzaprine (FLEXERIL) 5 MG tablet Take 5 mg by mouth every 4 (  four) hours as needed.    ? empagliflozin (JARDIANCE) 25 MG TABS tablet Take 1 tablet (25 mg total) by mouth daily. 90 tablet 3  ? glucose blood (FREESTYLE LITE) test strip 1 each by Other route in the morning and at bedtime. Use as instructed 200 each 3  ? insulin aspart protamine - aspart (NOVOLOG 70/30 MIX) (70-30) 100 UNIT/ML FlexPen Inject 18 Units into the skin daily with  breakfast AND 20 Units daily with supper. 30 mL 3  ? Insulin Pen Needle 32G X 4 MM MISC 1 Device by Does not apply route in the morning and at bedtime. 200 each 3  ? Lancets (FREESTYLE) lancets 1 Device by Misc.(Non-Drug; Combo Route) route daily. USED TO CHECK BLOOD SUGAR TID    ? loratadine (CLARITIN) 10 MG tablet Take by mouth.    ? Multiple Vitamin (MULTIVITAMIN) tablet Take 2 tablets by mouth daily. Gummies    ? naloxone (NARCAN) nasal spray 4 mg/0.1 mL Place 1 spray into the nose daily as needed (overdose).    ? pantoprazole (PROTONIX) 40 MG tablet Take 1 tablet (40 mg total) by mouth daily. 30 tablet 11  ? [DISCONTINUED] sucralfate (CARAFATE) 1 g tablet Take 1 tablet (1 g total) by mouth 4 (four) times daily -  before meals and at bedtime. Dissolve 1 tablet in 2-4 oz of water, drink as slurry 120 tablet 1  ? ?No current facility-administered medications on file prior to visit.  ? ? ?Allergies  ?Allergen Reactions  ? Ibuprofen Other (See Comments)  ?  Developed a ulcer  ?Other reaction(s): Other (See Comments) ?Developed a ulcer   ? Prednisone Anaphylaxis, Shortness Of Breath and Palpitations  ? Solu-Medrol [Methylprednisolone] Anaphylaxis, Shortness Of Breath and Palpitations  ? Bactrim [Sulfamethoxazole-Trimethoprim] Other (See Comments)  ?  "blotchiness" redness, face swelling  ? Biaxin [Clarithromycin] Swelling  ? Naproxen Other (See Comments)  ?  Developed a ulcer   ? Sulfa Antibiotics Other (See Comments)  ?  Other reaction(s): Angioedema ?Other reaction(s): Angioedema  ? ? ?Social History  ? ?Socioeconomic History  ? Marital status: Married  ?  Spouse name: Jimmie  ? Number of children: 4  ? Years of education: Not on file  ? Highest education level: 12th grade  ?Occupational History  ? Occupation: disabled  ?Tobacco Use  ? Smoking status: Former  ?  Packs/day: 0.25  ?  Years: 5.00  ?  Pack years: 1.25  ?  Types: Cigarettes  ?  Quit date: 83  ?  Years since quitting: 33.2  ? Smokeless tobacco: Never   ?Vaping Use  ? Vaping Use: Never used  ?Substance and Sexual Activity  ? Alcohol use: Yes  ?  Comment: rarely  ? Drug use: No  ? Sexual activity: Yes  ?Other Topics Concern  ? Not on file  ?Social History Narrative  ? Pt is right-handed. She lives with her husband in a 2 story house. Pt avoids caffeine. She and her husband go to the gym, lately once weekly, previously 3 times a week.   ? ?Social Determinants of Health  ? ?Financial Resource Strain: Not on file  ?Food Insecurity: Not on file  ?Transportation Needs: Not on file  ?Physical Activity: Not on file  ?Stress: Not on file  ?Social Connections: Not on file  ?Intimate Partner Violence: Not on file  ? ? ?Family History  ?Problem Relation Age of Onset  ? Heart disease Mother   ? Kidney disease Mother   ? Diabetes  Mother   ? Other Father 50  ?     murdered  ? Multiple sclerosis Daughter   ? Multiple sclerosis Other   ? Diabetes Sister   ? Alcohol abuse Brother   ?     ETOH and marijuana  ? Diabetes Brother   ? Diabetes Sister   ? Diabetes Brother   ? ALS Brother   ? ? ?Past Surgical History:  ?Procedure Laterality Date  ? ABDOMINAL HYSTERECTOMY  1976  ? including oophorectomy, due to uterine cancer  ? APPENDECTOMY  1974  ? BIOPSY  01/31/2019  ? Procedure: BIOPSY;  Surgeon: Mauri Pole, MD;  [facility];  Service: Endoscopy;;  ? CARPAL TUNNEL RELEASE Left   ? carpal tunnel surgery  ? CHOLECYSTECTOMY    ? 2006  ? COLON SURGERY    ? SBO x 2  ? COLONOSCOPY WITH PROPOFOL N/A 01/31/2019  ? Procedure: COLONOSCOPY WITH PROPOFOL;  Surgeon: Mauri Pole, MD;  [facility];  Service: Endoscopy;  Laterality: N/A;  ? EUS N/A 04/09/2017  ? Procedure: UPPER ENDOSCOPIC ULTRASOUND (EUS) LINEAR;  Surgeon: Milus Banister, MD;  [facility];  Service: Endoscopy;  Laterality: N/A;  ? HERNIA REPAIR    ? MENISCUS REPAIR Left   ? POLYPECTOMY  01/31/2019  ? Procedure: POLYPECTOMY;  Surgeon: Mauri Pole, MD;  [facility];   Service: Endoscopy;;  ? SMALL INTESTINE SURGERY    ? toenail removal Right 12/2017  ? great toenail   ? ? ?ROS: ?Review of Systems ?Negative except as stated above ? ?PHYSICAL EXAM: ?BP 124/86 (BP Location:

## 2021-06-05 ENCOUNTER — Encounter: Payer: Self-pay | Admitting: Family

## 2021-06-05 ENCOUNTER — Ambulatory Visit (INDEPENDENT_AMBULATORY_CARE_PROVIDER_SITE_OTHER): Payer: Medicare Other | Admitting: Family

## 2021-06-05 VITALS — BP 124/86 | HR 83 | Temp 98.3°F | Resp 18 | Ht 62.99 in | Wt 160.0 lb

## 2021-06-05 DIAGNOSIS — R590 Localized enlarged lymph nodes: Secondary | ICD-10-CM

## 2021-06-05 DIAGNOSIS — B372 Candidiasis of skin and nail: Secondary | ICD-10-CM

## 2021-06-05 DIAGNOSIS — M818 Other osteoporosis without current pathological fracture: Secondary | ICD-10-CM | POA: Diagnosis not present

## 2021-06-05 DIAGNOSIS — Z124 Encounter for screening for malignant neoplasm of cervix: Secondary | ICD-10-CM

## 2021-06-05 DIAGNOSIS — R635 Abnormal weight gain: Secondary | ICD-10-CM | POA: Diagnosis not present

## 2021-06-05 NOTE — Progress Notes (Signed)
Pt presents for wound under belly request referral to plastic surgery  ?

## 2021-06-06 ENCOUNTER — Encounter: Payer: Self-pay | Admitting: Family

## 2021-06-06 DIAGNOSIS — Z79899 Other long term (current) drug therapy: Secondary | ICD-10-CM | POA: Diagnosis not present

## 2021-06-11 DIAGNOSIS — Z20822 Contact with and (suspected) exposure to covid-19: Secondary | ICD-10-CM | POA: Diagnosis not present

## 2021-06-18 ENCOUNTER — Other Ambulatory Visit: Payer: Self-pay | Admitting: Family

## 2021-06-18 ENCOUNTER — Ambulatory Visit
Admission: RE | Admit: 2021-06-18 | Discharge: 2021-06-18 | Disposition: A | Discharge status: Home / Self Care | Payer: Medicare Other | Source: Ambulatory Visit | Attending: Family | Admitting: Family

## 2021-06-18 DIAGNOSIS — R590 Localized enlarged lymph nodes: Secondary | ICD-10-CM

## 2021-06-18 NOTE — Progress Notes (Signed)
Thyroid normal size. Negative for mass, nodule, adenopathy.

## 2021-06-25 DIAGNOSIS — Z6828 Body mass index (BMI) 28.0-28.9, adult: Secondary | ICD-10-CM | POA: Diagnosis not present

## 2021-06-25 DIAGNOSIS — M25511 Pain in right shoulder: Secondary | ICD-10-CM | POA: Diagnosis not present

## 2021-06-25 DIAGNOSIS — Z6830 Body mass index (BMI) 30.0-30.9, adult: Secondary | ICD-10-CM | POA: Diagnosis not present

## 2021-06-25 DIAGNOSIS — M549 Dorsalgia, unspecified: Secondary | ICD-10-CM | POA: Diagnosis not present

## 2021-06-25 DIAGNOSIS — Z79899 Other long term (current) drug therapy: Secondary | ICD-10-CM | POA: Diagnosis not present

## 2021-06-25 DIAGNOSIS — M545 Low back pain, unspecified: Secondary | ICD-10-CM | POA: Diagnosis not present

## 2021-06-25 DIAGNOSIS — M542 Cervicalgia: Secondary | ICD-10-CM | POA: Diagnosis not present

## 2021-06-28 DIAGNOSIS — Z79899 Other long term (current) drug therapy: Secondary | ICD-10-CM | POA: Diagnosis not present

## 2021-07-01 DIAGNOSIS — Z20822 Contact with and (suspected) exposure to covid-19: Secondary | ICD-10-CM | POA: Diagnosis not present

## 2021-07-05 ENCOUNTER — Encounter: Payer: Self-pay | Admitting: Family

## 2021-07-05 NOTE — Telephone Encounter (Signed)
Spoke to pt to advise provider stated ok to take Zithromax that she has at home, if symptoms persist please call to make an appointment, pt understood stated she just wanted to let provider know she was going to be taking it ?

## 2021-07-08 ENCOUNTER — Ambulatory Visit (INDEPENDENT_AMBULATORY_CARE_PROVIDER_SITE_OTHER): Payer: Medicare Other | Admitting: Plastic Surgery

## 2021-07-08 VITALS — BP 120/72 | HR 82 | Ht 63.0 in | Wt 163.0 lb

## 2021-07-08 DIAGNOSIS — M793 Panniculitis, unspecified: Secondary | ICD-10-CM

## 2021-07-08 DIAGNOSIS — R1084 Generalized abdominal pain: Secondary | ICD-10-CM | POA: Diagnosis not present

## 2021-07-08 DIAGNOSIS — R21 Rash and other nonspecific skin eruption: Secondary | ICD-10-CM | POA: Diagnosis not present

## 2021-07-08 DIAGNOSIS — K439 Ventral hernia without obstruction or gangrene: Secondary | ICD-10-CM

## 2021-07-09 NOTE — Progress Notes (Signed)
? ?Referring Provider ?Camillia Herter, NP ?[facility] ?[address] ?[place],  [place] [address]  ? ?CC:  ?Abdominal rash ? ? ?Courtney [name] is an 66 y.o. female.  ?HPI: 66 year old interested in abdominal improvement with significant abdominal rashes.  She has had a vaginal hysterectomy, surgery for small bowel obstruction, ovary surgery and hernia repair of her abdomen.  She is diabetic.  Her hemoglobin A1c stays around 7 and she sees endocrine and they are happy with where she is.  She is a non-smoker ? ?Allergies  ?Allergen Reactions  ? Ibuprofen Other (See Comments)  ?  Developed a ulcer  ?Other reaction(s): Other (See Comments) ?Developed a ulcer   ? Prednisone Anaphylaxis, Shortness Of Breath and Palpitations  ? Solu-Medrol [Methylprednisolone] Anaphylaxis, Shortness Of Breath and Palpitations  ? Bactrim [Sulfamethoxazole-Trimethoprim] Other (See Comments)  ?  "blotchiness" redness, face swelling  ? Biaxin [Clarithromycin] Swelling  ? Naproxen Other (See Comments)  ?  Developed a ulcer   ? Sulfa Antibiotics Other (See Comments)  ?  Other reaction(s): Angioedema ?Other reaction(s): Angioedema  ? ? ?Outpatient Encounter Medications as of 07/08/2021  ?Medication Sig  ? acetaminophen (TYLENOL) 500 MG tablet Take 1,000 mg by mouth every 8 (eight) hours as needed for headache.  ? alprazolam (XANAX) 2 MG tablet Take 2 mg by mouth 3 (three) times daily as needed for anxiety.   ? cyclobenzaprine (FLEXERIL) 5 MG tablet Take 5 mg by mouth every 4 (four) hours as needed.  ? empagliflozin (JARDIANCE) 25 MG TABS tablet Take 1 tablet (25 mg total) by mouth daily.  ? glucose blood (FREESTYLE LITE) test strip 1 each by Other route in the morning and at bedtime. Use as instructed  ? insulin aspart protamine - aspart (NOVOLOG 70/30 MIX) (70-30) 100 UNIT/ML FlexPen Inject 18 Units into the skin daily with breakfast AND 20 Units daily with supper.  ? Insulin Pen Needle 32G X 4 MM MISC 1 Device by Does not apply route in the  morning and at bedtime.  ? Lancets (FREESTYLE) lancets 1 Device by Misc.(Non-Drug; Combo Route) route daily. USED TO CHECK BLOOD SUGAR TID  ? loratadine (CLARITIN) 10 MG tablet Take by mouth.  ? Multiple Vitamin (MULTIVITAMIN) tablet Take 2 tablets by mouth daily. Gummies  ? naloxone (NARCAN) nasal spray 4 mg/0.1 mL Place 1 spray into the nose daily as needed (overdose).  ? pantoprazole (PROTONIX) 40 MG tablet Take 1 tablet (40 mg total) by mouth daily.  ? [DISCONTINUED] sucralfate (CARAFATE) 1 g tablet Take 1 tablet (1 g total) by mouth 4 (four) times daily -  before meals and at bedtime. Dissolve 1 tablet in 2-4 oz of water, drink as slurry  ? ?No facility-administered encounter medications on file as of 07/08/2021.  ?  ? ?Past Medical History:  ?Diagnosis Date  ? Abnormal ECG 07/18/2015  ? Arthritis   ? Asthma   ? Bowel obstruction ([place])   ? Depression with anxiety 02/09/2015  ? mild xanax prn  ? Diabetes mellitus ([place]) 07/18/2015  ? type 2  ? Diverticulitis   ? Dysrhythmia   ? abnormal ekgs worked up  ? Essential tremor 03/31/2016  ? Fibromyalgia   ? Gait difficulty 02/09/2015  ? Gallstones   ? GERD (gastroesophageal reflux disease)   ? HLD (hyperlipidemia)   ? Hypersomnia 11/09/2015  ? Hypertension   ? no meds  ? Hyperthyroidism   ? patient states has no problems at present-01/25/2019  ? Hypothyroidism   ?  patient states no problems at present 01/25/2019  ? IBS (irritable bowel syndrome)   ? Insomnia 02/09/2015  ? Migraines   ? MS (multiple sclerosis) ([place])   ? Multiple joint pain 03/31/2016  ? Numbness in both hands 03/18/2016  ? Other fatigue 02/09/2015  ? Peptic ulcer   ? Pneumonia   ? PONV (postoperative nausea and vomiting)   ? Urinary hesitancy 02/09/2015  ? Vision abnormalities   ? ? ?Past Surgical History:  ?Procedure Laterality Date  ? ABDOMINAL HYSTERECTOMY  1976  ? including oophorectomy, due to uterine cancer  ? APPENDECTOMY  1974  ? BIOPSY  01/31/2019  ? Procedure: BIOPSY;  Surgeon: Mauri Pole, MD;   [facility];  Service: Endoscopy;;  ? CARPAL TUNNEL RELEASE Left   ? carpal tunnel surgery  ? CHOLECYSTECTOMY    ? 2006  ? COLON SURGERY    ? SBO x 2  ? COLONOSCOPY WITH PROPOFOL N/A 01/31/2019  ? Procedure: COLONOSCOPY WITH PROPOFOL;  Surgeon: Mauri Pole, MD;  [facility];  Service: Endoscopy;  Laterality: N/A;  ? EUS N/A 04/09/2017  ? Procedure: UPPER ENDOSCOPIC ULTRASOUND (EUS) LINEAR;  Surgeon: Milus Banister, MD;  [facility];  Service: Endoscopy;  Laterality: N/A;  ? HERNIA REPAIR    ? MENISCUS REPAIR Left   ? POLYPECTOMY  01/31/2019  ? Procedure: POLYPECTOMY;  Surgeon: Mauri Pole, MD;  [facility];  Service: Endoscopy;;  ? SMALL INTESTINE SURGERY    ? toenail removal Right 12/2017  ? great toenail   ? ? ?Family History  ?Problem Relation Age of Onset  ? Heart disease Mother   ? Kidney disease Mother   ? Diabetes Mother   ? Other Father 8  ?     murdered  ? Multiple sclerosis Daughter   ? Multiple sclerosis Other   ? Diabetes Sister   ? Alcohol abuse Brother   ?     ETOH and marijuana  ? Diabetes Brother   ? Diabetes Sister   ? Diabetes Brother   ? ALS Brother   ? ? ?Social History  ? ?Social History Narrative  ? Pt is right-handed. She lives with her husband in a 2 story house. Pt avoids caffeine. She and her husband go to the gym, lately once weekly, previously 3 times a week.   ?  ? ?Review of Systems ?General: Denies fevers, chills, weight loss ?CV: Denies chest pain, shortness of breath, palpitations ? ? ?Physical Exam ? ?  07/08/2021  ?  3:48 PM 06/05/2021  ?  9:14 AM 05/24/2021  ?  1:47 PM  ?Vitals with BMI  ?Height '5\' 3"'$  5' 2.992" '5\' 5"'$   ?Weight 163 lbs 160 lbs 160 lbs  ?BMI 28.88 28.35 26.63  ?Systolic 428 768 115  ?Diastolic 72 86 79  ?Pulse 82 83 86  ?  ?General:  No acute distress,  Alert and oriented, Non-Toxic, Normal speech and affect ?Abdomen: Midline abdominal scar vertically to the level umbilicus and another vertical scar.   Significant excess above and below the umbilicus. ? ?Assessment/Plan ?Good candidate for abdomen surgery but needs CT scan first to make sure she does not have a hernia or other concerns. ? ?Lennice Sites ?07/09/2021, 7:25 PM  ? ? ?  ?

## 2021-07-17 ENCOUNTER — Encounter: Payer: Self-pay | Admitting: Family

## 2021-07-23 DIAGNOSIS — M545 Low back pain, unspecified: Secondary | ICD-10-CM | POA: Diagnosis not present

## 2021-07-23 DIAGNOSIS — Z79899 Other long term (current) drug therapy: Secondary | ICD-10-CM | POA: Diagnosis not present

## 2021-07-23 DIAGNOSIS — Z6829 Body mass index (BMI) 29.0-29.9, adult: Secondary | ICD-10-CM | POA: Diagnosis not present

## 2021-07-24 ENCOUNTER — Telehealth: Payer: Self-pay | Admitting: Family

## 2021-07-24 NOTE — Telephone Encounter (Signed)
Copied from [place] [id number]. Topic: General - Other >> Jul 24, 2021  9:47 AM Yvette Rack wrote: Reason for CRM: Pt requests that Charlena Cross returns the call at her earliest convenience.

## 2021-07-25 NOTE — Telephone Encounter (Signed)
Pt contacted.

## 2021-07-26 ENCOUNTER — Telehealth: Payer: Self-pay | Admitting: Plastic Surgery

## 2021-07-26 ENCOUNTER — Other Ambulatory Visit: Payer: Self-pay

## 2021-07-26 DIAGNOSIS — N1831 Chronic kidney disease, stage 3a: Secondary | ICD-10-CM

## 2021-07-26 NOTE — Telephone Encounter (Signed)
Spoke to Dr. Erin Hearing regarding lab work. He wanted me to enter a BMP to check renal function due to patients age/hx of diabetes and getting a CT w/contrast. I entered the lab and l/m for patient letting her know.

## 2021-07-26 NOTE — Telephone Encounter (Signed)
I reviewed her chart and it looks like she told her PCP that she has labs already scheduled with her endocrinologist on 08/05/2021.  The only one I suspect that would be required before a CT w/ contrast would be BMP to evaluate renal function.

## 2021-07-26 NOTE — Telephone Encounter (Signed)
Pt is calling in wanting to know if she needs to do any lab work before her CT appointment, if so she would like to have the orders put in of what labs she is needing so she can get it done at her PCP office.  Pt would like to have a call back to let her know.

## 2021-07-29 DIAGNOSIS — Z79899 Other long term (current) drug therapy: Secondary | ICD-10-CM | POA: Diagnosis not present

## 2021-08-05 ENCOUNTER — Ambulatory Visit (INDEPENDENT_AMBULATORY_CARE_PROVIDER_SITE_OTHER): Payer: Medicare Other | Admitting: Internal Medicine

## 2021-08-05 ENCOUNTER — Encounter: Payer: Self-pay | Admitting: Internal Medicine

## 2021-08-05 VITALS — BP 114/72 | HR 81 | Ht 63.0 in | Wt 163.8 lb

## 2021-08-05 DIAGNOSIS — E1142 Type 2 diabetes mellitus with diabetic polyneuropathy: Secondary | ICD-10-CM | POA: Diagnosis not present

## 2021-08-05 DIAGNOSIS — N1831 Chronic kidney disease, stage 3a: Secondary | ICD-10-CM | POA: Diagnosis not present

## 2021-08-05 DIAGNOSIS — Z794 Long term (current) use of insulin: Secondary | ICD-10-CM | POA: Diagnosis not present

## 2021-08-05 DIAGNOSIS — E1122 Type 2 diabetes mellitus with diabetic chronic kidney disease: Secondary | ICD-10-CM

## 2021-08-05 LAB — POCT GLYCOSYLATED HEMOGLOBIN (HGB A1C): Hemoglobin A1C: 6.8 % — AB (ref 4.0–5.6)

## 2021-08-05 MED ORDER — TRULICITY 0.75 MG/0.5ML ~~LOC~~ SOAJ: 0.7500 mg | SUBCUTANEOUS | 3 refills | Status: DC

## 2021-08-05 NOTE — Patient Instructions (Signed)
-   Continue Jardiance 25 mg 1 tablet daily  - Change  Novolog Mix 16 units with breakfast and 20 units with supper  - Start Trulicity 0.09 mg once weekly      HOW TO TREAT LOW BLOOD SUGARS (Blood sugar LESS THAN 70 MG/DL) Please follow the RULE OF 15 for the treatment of hypoglycemia treatment (when your (blood sugars are less than 70 mg/dL)   STEP 1: Take 15 grams of carbohydrates when your blood sugar is low, which includes:  3-4 GLUCOSE TABS  OR 3-4 OZ OF JUICE OR REGULAR SODA OR ONE TUBE OF GLUCOSE GEL    STEP 2: RECHECK blood sugar in 15 MINUTES STEP 3: If your blood sugar is still low at the 15 minute recheck --> then, go back to STEP 1 and treat AGAIN with another 15 grams of carbohydrates.

## 2021-08-05 NOTE — Progress Notes (Signed)
Name: Courtney [name]  Age/ Sex: 66 y.o., female   MRN/ DOB: [id number], Apr 18, 1955     PCP: Camillia Herter, NP   Reason for Endocrinology Evaluation: Type 2 Diabetes Mellitus  Initial Endocrine Consultative Visit: 06/20/2020    PATIENT IDENTIFIER: [name] is a 66 y.o. female with a past medical history of T2DM, MS , HTN and essential tremors. The patient has followed with Endocrinology clinic since 06/20/20 for consultative assistance with management of her diabetes.  DIABETIC HISTORY:  [name] was diagnosed with DM in 2008, Victoza caused nausea. Has been on insulin since 2011. Her hemoglobin A1c has ranged from 7.3% in 2017, peaking at 8.0% in years ago.   On her initial visit to our clinic she had an A1c of 6.4% , she was on Jardiance and novolog mix . She was having recurrent hypoglycemia at the time, we reduced insulin and continued Jardiance   SUBJECTIVE:   During the last visit (01/28/2021): A1c 6.5%  continued Jardiance and novolog mix       Today (08/05/2021): [name] is here for a follow up on diabetes management.  She checks her blood sugars daily . The patient has had hypoglycemia episodes, she is symptomatic with these episodes     Weight stable  Denies nausea, vomiting or diarrhea  No hx of pancreatitis  Was recently seen by plastic surgery due to abdominal wall ulcer    HOME DIABETES REGIMEN:  Jardiance 25 mg 1 tablet daily  Novolog Mix 18 units with breakfast and 22 units  supper      Statin: no ACE-I/ARB: no Prior Diabetic Education: no   METER DOWNLOAD SUMMARY: unable to download  98-226 mg/dL      DIABETIC COMPLICATIONS: Microvascular complications:  Neuropathy Denies: retinopathy, CKD Last Eye Exam: Completed 02/05/2021  Macrovascular complications:   Denies: CAD, CVA, PVD   HISTORY:  Past Medical History:  Past Medical History:  Diagnosis Date   Abnormal ECG 07/18/2015   Arthritis    Asthma     Bowel obstruction (HCC)    Depression with anxiety 02/09/2015   mild xanax prn   Diabetes mellitus ([place]) 07/18/2015   type 2   Diverticulitis    Dysrhythmia    abnormal ekgs worked up   Essential tremor 03/31/2016   Fibromyalgia    Gait difficulty 02/09/2015   Gallstones    GERD (gastroesophageal reflux disease)    HLD (hyperlipidemia)    Hypersomnia 11/09/2015   Hypertension    no meds   Hyperthyroidism    patient states has no problems at present-01/25/2019   Hypothyroidism    patient states no problems at present 01/25/2019   IBS (irritable bowel syndrome)    Insomnia 02/09/2015   Migraines    MS (multiple sclerosis) ([place])    Multiple joint pain 03/31/2016   Numbness in both hands 03/18/2016   Other fatigue 02/09/2015   Peptic ulcer    Pneumonia    PONV (postoperative nausea and vomiting)    Urinary hesitancy 02/09/2015   Vision abnormalities    Past Surgical History:  Past Surgical History:  Procedure Laterality Date   ABDOMINAL HYSTERECTOMY  1976   including oophorectomy, due to uterine cancer   [place]  01/31/2019   Procedure: BIOPSY;  Surgeon: Mauri Pole, MD;  Location: [facility];  Service: Endoscopy;;   CARPAL TUNNEL RELEASE Left    carpal tunnel surgery   CHOLECYSTECTOMY  2006   COLON SURGERY     SBO x 2   COLONOSCOPY WITH PROPOFOL N/A 01/31/2019   Procedure: COLONOSCOPY WITH PROPOFOL;  Surgeon: Mauri Pole, MD;  Location: [facility];  Service: Endoscopy;  Laterality: N/A;   EUS N/A 04/09/2017   Procedure: UPPER ENDOSCOPIC ULTRASOUND (EUS) LINEAR;  Surgeon: Milus Banister, MD;  Location: [facility];  Service: Endoscopy;  Laterality: N/A;   HERNIA REPAIR     MENISCUS REPAIR Left    POLYPECTOMY  01/31/2019   Procedure: POLYPECTOMY;  Surgeon: Mauri Pole, MD;  Location: [facility];  Service: Endoscopy;;   SMALL INTESTINE SURGERY     toenail removal Right 12/2017   great toenail    Social History:   reports that she quit smoking about 33 years ago. Her smoking use included cigarettes. She has a 1.25 pack-year smoking history. She has never used smokeless tobacco. She reports current alcohol use. She reports that she does not use drugs. Family History:  Family History  Problem Relation Age of Onset   Heart disease Mother    Kidney disease Mother    Diabetes Mother    Other Father 73       murdered   Multiple sclerosis Daughter    Multiple sclerosis Other    Diabetes Sister    Alcohol abuse Brother        ETOH and marijuana   Diabetes Brother    Diabetes Sister    Diabetes Brother    ALS Brother      HOME MEDICATIONS: Allergies as of 08/05/2021       Reactions   Ibuprofen Other (See Comments)   Developed a ulcer  Other reaction(s): Other (See Comments) Developed a ulcer    Prednisone Anaphylaxis, Shortness Of Breath, Palpitations   Solu-medrol [methylprednisolone] Anaphylaxis, Shortness Of Breath, Palpitations   Bactrim [sulfamethoxazole-trimethoprim] Other (See Comments)   "blotchiness" redness, face swelling   Biaxin [clarithromycin] Swelling   Naproxen Other (See Comments)   Developed a ulcer    Sulfa Antibiotics Other (See Comments)   Other reaction(s): Angioedema Other reaction(s): Angioedema        Medication List        Accurate as of August 05, 2021 10:01 AM. If you have any questions, ask your nurse or doctor.          acetaminophen 500 MG tablet Commonly known as: TYLENOL Take 1,000 mg by mouth every 8 (eight) hours as needed for headache.   alprazolam 2 MG tablet Commonly known as: XANAX Take 2 mg by mouth 3 (three) times daily as needed for anxiety.   cyclobenzaprine 5 MG tablet Commonly known as: FLEXERIL Take 5 mg by mouth every 4 (four) hours as needed.   empagliflozin 25 MG Tabs tablet Commonly known as: JARDIANCE Take 1 tablet (25 mg total) by mouth daily.   freestyle lancets 1 Device by Misc.(Non-Drug; Combo Route) route daily.  USED TO CHECK BLOOD SUGAR TID   FREESTYLE LITE test strip Generic drug: glucose blood 1 each by Other route in the morning and at bedtime. Use as instructed   insulin aspart protamine - aspart (70-30) 100 UNIT/ML FlexPen Commonly known as: NOVOLOG 70/30 MIX Inject 18 Units into the skin daily with breakfast AND 20 Units daily with supper.   Insulin Pen Needle 32G X 4 MM Misc 1 Device by Does not apply route in the morning and at bedtime.   loratadine 10 MG tablet Commonly known as: CLARITIN Take by mouth.  multivitamin tablet Take 2 tablets by mouth daily. Gummies   naloxone 4 MG/0.1ML Liqd nasal spray kit Commonly known as: NARCAN Place 1 spray into the nose daily as needed (overdose).   oxyCODONE 15 MG immediate release tablet Commonly known as: ROXICODONE Take 15 mg by mouth 5 (five) times daily as needed.   pantoprazole 40 MG tablet Commonly known as: PROTONIX Take 1 tablet (40 mg total) by mouth daily.         OBJECTIVE:   Vital Signs: BP 114/72 (BP Location: Left Arm, Patient Position: Sitting, Cuff Size: Small)   Pulse 81   Ht 5' 3"  (1.6 m)   Wt 163 lb 12.8 oz (74.3 kg)   SpO2 100%   BMI 29.02 kg/m   Wt Readings from Last 3 Encounters:  08/05/21 163 lb 12.8 oz (74.3 kg)  07/08/21 163 lb (73.9 kg)  06/05/21 160 lb (72.6 kg)     Exam: General: Pt appears well and is in NAD  Lungs: Clear with good BS bilat with no rales, rhonchi, or wheezes  Heart: RRR with normal S1 and S2 and no gallops; no murmurs; no rub  Abdomen: Normoactive bowel sounds, soft, nontender, without masses or organomegaly palpable  Extremities: No pretibial edema.   Neuro: MS is good with appropriate affect, pt is alert and Ox3   DM Foot Exam 01/28/2021   The skin of the feet is intact without sores or ulcerations. The pedal pulses are 2+ on right and 2+ on left. The sensation is intact to a screening 5.07, 10 gram monofilament bilaterally    DATA REVIEWED:  Lab Results   Component Value Date   HGBA1C 6.5 (A) 01/28/2021   HGBA1C 6.8 (A) 10/19/2020   HGBA1C 6.4 (A) 06/20/2020     ASSESSMENT / PLAN / RECOMMENDATIONS:   1) Type 2 Diabetes Mellitus, Optimally controlled, With neuropathic and CKD III complications - Most recent A1c of 6.8 %. Goal A1c < 7.0 %.    - A1c continues at goal -Patient is interested in weekly injections, she has no prior history of pancreatitis.  We did discuss the benefits of GLP-1 agonist versus SGLT2 inhibitors.  I have recommended adding a GLP-1 agonist to Jardiance rather than replacing due to added benefit -We cautioned against GI side effects -I will reduce her insulin dose to prevent hypoglycemia -She is under the impression that she may have been on Trulicity before which caused severe pain with the injection, I only have history of her taking Victoza, we did discuss trying Trulicity and if she has an issue then we will try Ozempic   MEDICATIONS: Continue Jardiance 25 mg 1 tablet daily  Start Trulicity 0.80 mg weekly Decrease Novolog 16 units with breakfast and 20 units with  supper     EDUCATION / INSTRUCTIONS: BG monitoring instructions: Patient is instructed to check her blood sugars 2 times a day, breakfast and supper. Call [facility] if: BG persistently < 70  I reviewed the Rule of 15 for the treatment of hypoglycemia in detail with the patient. Literature supplied.   2) Diabetic complications:  Eye: Does not have known diabetic retinopathy.  Neuro/ Feet: Does  have known diabetic peripheral neuropathy .  Renal: Patient does not have known baseline CKD. She   is not on an ACEI/ARB at present.      3) Lipids: Patient is has been off statins due to severe cramps. LDL 89 mg/dL during labs 05/2021.  In the past she has declined  statin therapy  due to cramps        4) CKD III :   -She had a low GFR in 04/2020, she used to be on ACE/ARB but this was stopped due to low BP. I tried to restart  lisinopril 2.5 mg 10/2020 but was unable due to low BP  -We will continue to monitor     F/U in 4 months      Signed electronically by: Mack Guise, MD  [facility]  [facility] [address] [place], [place] [address] Phone: [phone] FAX: [phone]   CC: Camillia Herter, NP [place] [address] Phone: [phone]  Fax: [phone]  Return to Endocrinology clinic as below: Future Appointments  Date Time Provider [place]  08/14/2021  1:00 PM [facility] 1 [facility]  08/15/2021 10:20 AM Camillia Herter, NP [facility] None

## 2021-08-06 DIAGNOSIS — F411 Generalized anxiety disorder: Secondary | ICD-10-CM | POA: Diagnosis not present

## 2021-08-06 DIAGNOSIS — Z79899 Other long term (current) drug therapy: Secondary | ICD-10-CM | POA: Diagnosis not present

## 2021-08-06 DIAGNOSIS — G4700 Insomnia, unspecified: Secondary | ICD-10-CM | POA: Diagnosis not present

## 2021-08-10 NOTE — Progress Notes (Signed)
Virtual Visit via Telephone Note  I connected with Courtney [name], on 08/15/2021 at 10:01 AM by telephone and verified that I am speaking with the correct person using two identifiers.  Consent: I discussed the limitations, risks, security and privacy concerns of performing an evaluation and management service by telephone and the availability of in person appointments. I also discussed with the patient that there may be a patient responsible charge related to this service. The patient expressed understanding and agreed to proceed.   Location of Patient: Home  Location of Provider: [facility] [place] participating in Telemedicine visit: Paislei Daveda Larock Durene Fruits, NP Elmon Else, CMA   History of Present Illness: Courtney [name] is a 66 year-old female who presents for pain management referral.   Reports she was established with Kellie Shropshire, MD at [facility]. Reports she was recently discharged from his care. Reports one day she thought she misplaced the pain medication and requested an early refill. Subsequently reports he accused her of misuse of pain medication suggesting that anyone may find the medication and cause potential harm to themselves if taken.   She does have an initial visit on 08/21/2021 with a new pain management provider. However, reports its at the same [facility] location) and she no longer wants to be at any [facility]. Requesting new referral to pain management.   Reports recently informed during appointment for CT scan (ordered per her plastic surgeon) that her GFR is 45. She is still established with Endocrinology and plans to notify them of kidney function update.  Reports she received a bill from [facility] in regards to labs not covered from an office visit in March 2023. Discussed my CMA will reach out to her for additional information to see if we can assist.     Past  Medical History:  Diagnosis Date   Abnormal ECG 07/18/2015   Arthritis    Asthma    Bowel obstruction ([place])    Depression with anxiety 02/09/2015   mild xanax prn   Diabetes mellitus ([place]) 07/18/2015   type 2   Diverticulitis    Dysrhythmia    abnormal ekgs worked up   Essential tremor 03/31/2016   Fibromyalgia    Gait difficulty 02/09/2015   Gallstones    GERD (gastroesophageal reflux disease)    HLD (hyperlipidemia)    Hypersomnia 11/09/2015   Hypertension    no meds   Hyperthyroidism    patient states has no problems at present-01/25/2019   Hypothyroidism    patient states no problems at present 01/25/2019   IBS (irritable bowel syndrome)    Insomnia 02/09/2015   Migraines    MS (multiple sclerosis) ([place])    Multiple joint pain 03/31/2016   Numbness in both hands 03/18/2016   Other fatigue 02/09/2015   Peptic ulcer    Pneumonia    PONV (postoperative nausea and vomiting)    Urinary hesitancy 02/09/2015   Vision abnormalities    Allergies  Allergen Reactions   Ibuprofen Other (See Comments)    Developed a ulcer  Other reaction(s): Other (See Comments) Developed a ulcer    Prednisone Anaphylaxis, Shortness Of Breath and Palpitations   Solu-Medrol [Methylprednisolone] Anaphylaxis, Shortness Of Breath and Palpitations   Bactrim [Sulfamethoxazole-Trimethoprim] Other (See Comments)    "blotchiness" redness, face swelling   Biaxin [Clarithromycin] Swelling   Naproxen Other (See Comments)    Developed a ulcer  Sulfa Antibiotics Other (See Comments)    Other reaction(s): Angioedema Other reaction(s): Angioedema    Current Outpatient Medications on File Prior to Visit  Medication Sig Dispense Refill   acetaminophen (TYLENOL) 500 MG tablet Take 1,000 mg by mouth every 8 (eight) hours as needed for headache.     alprazolam (XANAX) 2 MG tablet Take 2 mg by mouth 3 (three) times daily as needed for anxiety.      cyclobenzaprine (FLEXERIL) 5 MG tablet Take 5 mg by mouth every 4  (four) hours as needed.     Dulaglutide (TRULICITY) 6.94 WN/4.6EV SOPN Inject 0.75 mg into the skin once a week. 6 mL 3   empagliflozin (JARDIANCE) 25 MG TABS tablet Take 1 tablet (25 mg total) by mouth daily. 90 tablet 3   glucose blood (FREESTYLE LITE) test strip 1 each by Other route in the morning and at bedtime. Use as instructed 200 each 3   insulin aspart protamine - aspart (NOVOLOG 70/30 MIX) (70-30) 100 UNIT/ML FlexPen Inject 18 Units into the skin daily with breakfast AND 20 Units daily with supper. 30 mL 3   Insulin Pen Needle 32G X 4 MM MISC 1 Device by Does not apply route in the morning and at bedtime. 200 each 3   Lancets (FREESTYLE) lancets 1 Device by Misc.(Non-Drug; Combo Route) route daily. USED TO CHECK BLOOD SUGAR TID     loratadine (CLARITIN) 10 MG tablet Take by mouth.     Multiple Vitamin (MULTIVITAMIN) tablet Take 2 tablets by mouth daily. Gummies     naloxone (NARCAN) nasal spray 4 mg/0.1 mL Place 1 spray into the nose daily as needed (overdose).     oxyCODONE (ROXICODONE) 15 MG immediate release tablet Take 15 mg by mouth 5 (five) times daily as needed.     pantoprazole (PROTONIX) 40 MG tablet Take 1 tablet (40 mg total) by mouth daily. 30 tablet 11   [DISCONTINUED] sucralfate (CARAFATE) 1 g tablet Take 1 tablet (1 g total) by mouth 4 (four) times daily -  before meals and at bedtime. Dissolve 1 tablet in 2-4 oz of water, drink as slurry 120 tablet 1   No current facility-administered medications on file prior to visit.    Observations/Objective: Alert and oriented x 3. Not in acute distress. Physical examination not completed as this is a telemedicine visit.  Assessment and Plan: 1. Chronic pain syndrome - Referral to Pain Clinic for further evaluation and management. - Ambulatory referral to Pain Clinic  2. Stage 3a chronic kidney disease (HCC) 3. Elevated serum creatinine - Referral to Nephrology for further evaluation and management.  - Ambulatory referral  to Nephrology   Follow Up Instructions: - Follow-up with primary provider as scheduled.  - Referral to Pain Clinic.  - Referral to Nephrology.    Patient was given clear instructions to go to Emergency Department or return to medical center if symptoms don't improve, worsen, or new problems develop.The patient verbalized understanding.  I discussed the assessment and treatment plan with the patient. The patient was provided an opportunity to ask questions and all were answered. The patient agreed with the plan and demonstrated an understanding of the instructions.   The patient was advised to call back or seek an in-person evaluation if the symptoms worsen or if the condition fails to improve as anticipated.     I provided 25 minutes total of non-face-to-face time during this encounter.   Lyman Balingit Zachery Dauer, NP  [facility]  [place], [place] 08/15/2021, 2:25 PM

## 2021-08-12 ENCOUNTER — Telehealth: Payer: Self-pay | Admitting: *Deleted

## 2021-08-12 NOTE — Telephone Encounter (Signed)
Called pt in response to VM left on clinical line with questions regarding upcoming CT scan. Pt states she doesn't have an allergy to IV contrast but she prefers not to have it since she is a diabetic. Advised her to speak with CT tech to discuss concerns prior to scan. Pt verbalized agreement, no other concerns or questions voiced.

## 2021-08-14 ENCOUNTER — Ambulatory Visit (HOSPITAL_BASED_OUTPATIENT_CLINIC_OR_DEPARTMENT_OTHER)
Admission: RE | Admit: 2021-08-14 | Discharge: 2021-08-14 | Disposition: A | Discharge status: Home / Self Care | Payer: Medicare Other | Source: Ambulatory Visit | Attending: Plastic Surgery | Admitting: Plastic Surgery

## 2021-08-14 DIAGNOSIS — R1084 Generalized abdominal pain: Secondary | ICD-10-CM | POA: Diagnosis not present

## 2021-08-14 DIAGNOSIS — R109 Unspecified abdominal pain: Secondary | ICD-10-CM | POA: Diagnosis not present

## 2021-08-14 LAB — POCT I-STAT CREATININE: Creatinine, Ser: 1.3 mg/dL — ABNORMAL HIGH (ref 0.44–1.00)

## 2021-08-14 MED ORDER — IOHEXOL 300 MG/ML  SOLN
100.0000 mL | Freq: Once | INTRAMUSCULAR | Status: AC | PRN
Start: 2021-08-14 — End: 2021-08-14
  Administered 2021-08-14: 75 mL via INTRAVENOUS

## 2021-08-15 ENCOUNTER — Encounter: Payer: Self-pay | Admitting: Internal Medicine

## 2021-08-15 ENCOUNTER — Ambulatory Visit (INDEPENDENT_AMBULATORY_CARE_PROVIDER_SITE_OTHER): Payer: Medicare Other | Admitting: Family

## 2021-08-15 DIAGNOSIS — G894 Chronic pain syndrome: Secondary | ICD-10-CM | POA: Diagnosis not present

## 2021-08-15 DIAGNOSIS — R7989 Other specified abnormal findings of blood chemistry: Secondary | ICD-10-CM | POA: Diagnosis not present

## 2021-08-15 DIAGNOSIS — N1831 Chronic kidney disease, stage 3a: Secondary | ICD-10-CM | POA: Diagnosis not present

## 2021-08-20 ENCOUNTER — Telehealth: Payer: Self-pay | Admitting: *Deleted

## 2021-08-20 NOTE — Telephone Encounter (Signed)
Pt called to inquire if the results from her CT scan were back. Message forwarded to Dr. Erin Hearing and Donna Christen, PA-C

## 2021-08-21 ENCOUNTER — Telehealth: Payer: Self-pay

## 2021-08-21 DIAGNOSIS — Z79899 Other long term (current) drug therapy: Secondary | ICD-10-CM | POA: Diagnosis not present

## 2021-08-21 DIAGNOSIS — E1140 Type 2 diabetes mellitus with diabetic neuropathy, unspecified: Secondary | ICD-10-CM | POA: Diagnosis not present

## 2021-08-21 DIAGNOSIS — N1831 Chronic kidney disease, stage 3a: Secondary | ICD-10-CM | POA: Diagnosis not present

## 2021-08-21 DIAGNOSIS — Z0000 Encounter for general adult medical examination without abnormal findings: Secondary | ICD-10-CM | POA: Diagnosis not present

## 2021-08-21 DIAGNOSIS — M545 Low back pain, unspecified: Secondary | ICD-10-CM | POA: Diagnosis not present

## 2021-08-21 DIAGNOSIS — Z9181 History of falling: Secondary | ICD-10-CM | POA: Diagnosis not present

## 2021-08-21 DIAGNOSIS — E669 Obesity, unspecified: Secondary | ICD-10-CM | POA: Diagnosis not present

## 2021-08-21 DIAGNOSIS — F3342 Major depressive disorder, recurrent, in full remission: Secondary | ICD-10-CM | POA: Diagnosis not present

## 2021-08-21 NOTE — Telephone Encounter (Signed)
Patient was returning a call from Dr. Erin Hearing to go over her CT results. She stated that she would like to call patient's husband's phone: [phone] instead of hers.

## 2021-08-22 ENCOUNTER — Telehealth: Payer: Self-pay | Admitting: *Deleted

## 2021-08-22 NOTE — Telephone Encounter (Signed)
Call placed to Courtney [name] in response to message received from [facility] with [place]. Verified that I was speaking to correct pt with 2 identifiers. Pt had received CT results via Mychart and had missed call from Dr. Erin Hearing to go over them. Explained to pt that Dr. Erin Hearing is out of the office but I could ask Courtney Blue, PA to reach out to her. She states she is fine to wait until Dr. Erin Hearing returns next week to review the CT results with her at that time.

## 2021-08-23 DIAGNOSIS — Z79899 Other long term (current) drug therapy: Secondary | ICD-10-CM | POA: Diagnosis not present

## 2021-08-28 ENCOUNTER — Telehealth: Payer: Self-pay

## 2021-08-28 NOTE — Telephone Encounter (Signed)
Pt called and left a vm inquiring about CT results. I called pt back and she stated she had a CT on 6/14 but missed Dr. Erin Hearing call to go over them. She also inquired about the next steps regarding SX.   I called Donna Christen as Dr. Erin Hearing was OOO and he advised that results showed a hernia, nothing urgent and was was x surgical staging. He also adv that Dr. Erin Hearing is returning back to work tomorrow and he can call pt in between cases to adv her of next steps. Adv pt of [place]'s response and she conveyed understanding. I adv that I would send a message to Dr. Erin Hearing so that he could give her a call tomorrow. She stated she has an appt at 1:30 pm but if that is a better time for Dr. Erin Hearing to call, he can call her husband and leave details with him.   Husband's name is Jolayne Panther, [phone].

## 2021-08-29 ENCOUNTER — Encounter: Payer: Self-pay | Admitting: Family Medicine

## 2021-08-29 ENCOUNTER — Ambulatory Visit (INDEPENDENT_AMBULATORY_CARE_PROVIDER_SITE_OTHER): Payer: Medicare Other | Admitting: Family Medicine

## 2021-08-29 VITALS — BP 135/86 | HR 75 | Temp 98.1°F | Resp 16 | Wt 158.2 lb

## 2021-08-29 DIAGNOSIS — R490 Dysphonia: Secondary | ICD-10-CM

## 2021-08-29 DIAGNOSIS — R591 Generalized enlarged lymph nodes: Secondary | ICD-10-CM | POA: Diagnosis not present

## 2021-08-29 MED ORDER — AMOXICILLIN 875 MG PO TABS: 875.0000 mg | ORAL_TABLET | Freq: Two times a day (BID) | ORAL | 0 refills | Status: AC

## 2021-08-29 NOTE — Progress Notes (Signed)
Established Patient Office Visit  Subjective    Patient ID: Courtney [name], female    DOB: [birth year]  Age: 66 y.o. MRN: [id number]  CC: No chief complaint on file.   HPI Courtney [name] presents with complaint of increasing hoarseness for months and swelling in her throat. Patient denies fever/chills or viral sx. Patient denies weight loss or fatigue.    Outpatient Encounter Medications as of 08/29/2021  Medication Sig   acetaminophen (TYLENOL) 500 MG tablet Take 1,000 mg by mouth every 8 (eight) hours as needed for headache.   alprazolam (XANAX) 2 MG tablet Take 2 mg by mouth 3 (three) times daily as needed for anxiety.    cyclobenzaprine (FLEXERIL) 5 MG tablet Take 5 mg by mouth every 4 (four) hours as needed.   Dulaglutide (TRULICITY) 3.08 MV/7.8IO SOPN Inject 0.75 mg into the skin once a week.   empagliflozin (JARDIANCE) 25 MG TABS tablet Take 1 tablet (25 mg total) by mouth daily.   glucose blood (FREESTYLE LITE) test strip 1 each by Other route in the morning and at bedtime. Use as instructed   insulin aspart protamine - aspart (NOVOLOG 70/30 MIX) (70-30) 100 UNIT/ML FlexPen Inject 18 Units into the skin daily with breakfast AND 20 Units daily with supper.   Insulin Pen Needle 32G X 4 MM MISC 1 Device by Does not apply route in the morning and at bedtime.   Lancets (FREESTYLE) lancets 1 Device by Misc.(Non-Drug; Combo Route) route daily. USED TO CHECK BLOOD SUGAR TID   loratadine (CLARITIN) 10 MG tablet Take by mouth.   Multiple Vitamin (MULTIVITAMIN) tablet Take 2 tablets by mouth daily. Gummies   naloxone (NARCAN) nasal spray 4 mg/0.1 mL Place 1 spray into the nose daily as needed (overdose).   oxyCODONE (ROXICODONE) 15 MG immediate release tablet Take 15 mg by mouth 5 (five) times daily as needed.   pantoprazole (PROTONIX) 40 MG tablet Take 1 tablet (40 mg total) by mouth daily.   [DISCONTINUED] sucralfate (CARAFATE) 1 g tablet Take 1 tablet (1 g total) by mouth 4  (four) times daily -  before meals and at bedtime. Dissolve 1 tablet in 2-4 oz of water, drink as slurry   No facility-administered encounter medications on file as of 08/29/2021.    Past Medical History:  Diagnosis Date   Abnormal ECG 07/18/2015   Arthritis    Asthma    Bowel obstruction ([place])    Depression with anxiety 02/09/2015   mild xanax prn   Diabetes mellitus ([place]) 07/18/2015   type 2   Diverticulitis    Dysrhythmia    abnormal ekgs worked up   Essential tremor 03/31/2016   Fibromyalgia    Gait difficulty 02/09/2015   Gallstones    GERD (gastroesophageal reflux disease)    HLD (hyperlipidemia)    Hypersomnia 11/09/2015   Hypertension    no meds   Hyperthyroidism    patient states has no problems at present-01/25/2019   Hypothyroidism    patient states no problems at present 01/25/2019   IBS (irritable bowel syndrome)    Insomnia 02/09/2015   Migraines    MS (multiple sclerosis) ([place])    Multiple joint pain 03/31/2016   Numbness in both hands 03/18/2016   Other fatigue 02/09/2015   Peptic ulcer    Pneumonia    PONV (postoperative nausea and vomiting)    Urinary hesitancy 02/09/2015   Vision abnormalities     Past Surgical History:  Procedure Laterality Date  ABDOMINAL HYSTERECTOMY  1976   including oophorectomy, due to uterine cancer   APPENDECTOMY  1974   BIOPSY  01/31/2019   Procedure: BIOPSY;  Surgeon: Mauri Pole, MD;  Location: [facility];  Service: Endoscopy;;   CARPAL TUNNEL RELEASE Left    carpal tunnel surgery   CHOLECYSTECTOMY     2006   COLON SURGERY     SBO x 2   COLONOSCOPY WITH PROPOFOL N/A 01/31/2019   Procedure: COLONOSCOPY WITH PROPOFOL;  Surgeon: Mauri Pole, MD;  Location: [facility];  Service: Endoscopy;  Laterality: N/A;   EUS N/A 04/09/2017   Procedure: UPPER ENDOSCOPIC ULTRASOUND (EUS) LINEAR;  Surgeon: Milus Banister, MD;  Location: [facility];  Service: Endoscopy;  Laterality: N/A;   HERNIA REPAIR     MENISCUS  REPAIR Left    POLYPECTOMY  01/31/2019   Procedure: POLYPECTOMY;  Surgeon: Mauri Pole, MD;  Location: [facility];  Service: Endoscopy;;   SMALL INTESTINE SURGERY     toenail removal Right 12/2017   great toenail     Family History  Problem Relation Age of Onset   Heart disease Mother    Kidney disease Mother    Diabetes Mother    Other Father 82       murdered   Multiple sclerosis Daughter    Multiple sclerosis Other    Diabetes Sister    Alcohol abuse Brother        ETOH and marijuana   Diabetes Brother    Diabetes Sister    Diabetes Brother    ALS Brother     Social History   Socioeconomic History   Marital status: Married    Spouse name: Jimmie   Number of children: 4   Years of education: Not on file   Highest education level: 12th grade  Occupational History   Occupation: disabled  Tobacco Use   Smoking status: Former    Packs/day: 0.25    Years: 5.00    Total pack years: 1.25    Types: Cigarettes    Quit date: 1990    Years since quitting: 33.5   Smokeless tobacco: Never  Vaping Use   Vaping Use: Never used  Substance and Sexual Activity   Alcohol use: Yes    Comment: rarely   Drug use: No   Sexual activity: Yes  Other Topics Concern   Not on file  Social History Narrative   Pt is right-handed. She lives with her husband in a 2 story house. Pt avoids caffeine. She and her husband go to the gym, lately once weekly, previously 3 times a week.    Social Determinants of Health   Financial Resource Strain: Not on file  Food Insecurity: Not on file  Transportation Needs: Not on file  Physical Activity: Not on file  Stress: Not on file  Social Connections: Not on file  Intimate Partner Violence: Not on file    Review of Systems  All other systems reviewed and are negative.       Objective    BP 135/86   Pulse 75   Temp 98.1 F (36.7 C) (Oral)   Resp 16   Wt 158 lb 3.2 oz (71.8 kg)   SpO2 94%   BMI 28.02 kg/m   Physical  Exam Vitals and nursing note reviewed.  Constitutional:      General: She is not in acute distress. HENT:     Mouth/Throat:     Mouth: Mucous membranes are moist.  Pharynx: Oropharynx is clear.  Cardiovascular:     Rate and Rhythm: Normal rate and regular rhythm.  Pulmonary:     Effort: Pulmonary effort is normal.     Breath sounds: Normal breath sounds.  Musculoskeletal:     Cervical back: Normal range of motion and neck supple.  Lymphadenopathy:     Cervical: Cervical adenopathy present.  Neurological:     General: No focal deficit present.     Mental Status: She is alert and oriented to person, place, and time.         Assessment & Plan:   1. Lymphadenopathy Amoxicillin prescribed. Referral to ENT for further eval/mgt - Ambulatory referral to ENT  2. Hoarseness As above - Ambulatory referral to ENT   No follow-ups on file.   Becky Sax, MD

## 2021-08-30 ENCOUNTER — Encounter: Payer: Self-pay | Admitting: Family Medicine

## 2021-09-04 DIAGNOSIS — F411 Generalized anxiety disorder: Secondary | ICD-10-CM | POA: Diagnosis not present

## 2021-09-04 DIAGNOSIS — Z79899 Other long term (current) drug therapy: Secondary | ICD-10-CM | POA: Diagnosis not present

## 2021-09-05 NOTE — Addendum Note (Signed)
Addended by: Lennice Sites on: 09/05/2021 11:00 AM   Modules accepted: Orders

## 2021-09-19 DIAGNOSIS — N1831 Chronic kidney disease, stage 3a: Secondary | ICD-10-CM | POA: Diagnosis not present

## 2021-09-19 DIAGNOSIS — Z9181 History of falling: Secondary | ICD-10-CM | POA: Diagnosis not present

## 2021-09-19 DIAGNOSIS — M545 Low back pain, unspecified: Secondary | ICD-10-CM | POA: Diagnosis not present

## 2021-09-19 DIAGNOSIS — E669 Obesity, unspecified: Secondary | ICD-10-CM | POA: Diagnosis not present

## 2021-09-19 DIAGNOSIS — Z79899 Other long term (current) drug therapy: Secondary | ICD-10-CM | POA: Diagnosis not present

## 2021-09-19 DIAGNOSIS — F3342 Major depressive disorder, recurrent, in full remission: Secondary | ICD-10-CM | POA: Diagnosis not present

## 2021-09-19 DIAGNOSIS — E1140 Type 2 diabetes mellitus with diabetic neuropathy, unspecified: Secondary | ICD-10-CM | POA: Diagnosis not present

## 2021-09-23 DIAGNOSIS — Z79899 Other long term (current) drug therapy: Secondary | ICD-10-CM | POA: Diagnosis not present

## 2021-09-25 ENCOUNTER — Ambulatory Visit: Payer: Self-pay | Admitting: Surgery

## 2021-09-25 DIAGNOSIS — K439 Ventral hernia without obstruction or gangrene: Secondary | ICD-10-CM | POA: Diagnosis not present

## 2021-09-25 NOTE — H&P (Signed)
History of Present Illness: Courtney [name] is a 66 y.o. female who was referred to me for evaluation of a ventral hernia. She saw Dr. Erin Hearing in May to discuss a panniculectomy, and was referred for a CT to evaluate for underlying hernia. This showed a small epigastric hernia. She has not noticed a bulge but reports occasional pain in the area. She has had multiple prior abdominal surgeries, including a hernia repair with mesh in the 1990s (this was done in [place]). She also had two surgeries for an SBO in 2008 at [facility] in [place].    She follows with cardiology for chest pain, which has been extensively worked up in the past with heart caths and stress tests, which were normal. She most recently saw her cardiologist (Dr. Irish Lack) on 2/28 and was not having any new symptoms at that time. She is a former smoker and quit many years ago. She is also diabetic, which is well-controlled.     Review of Systems: A complete review of systems was obtained from the patient.  I have reviewed this information and discussed as appropriate with the patient.  See HPI as well for other ROS.       Medical History: Past Medical HistoryExpand by Default Past Medical History: Diagnosis Date  Arthritis    Asthma, unspecified asthma severity, unspecified whether complicated, unspecified whether persistent    Diabetes mellitus without complication (CMS-HCC)    Multiple sclerosis (CMS-HCC)    Thyroid disease        Patient Active Problem List Diagnosis  Supraumbilical hernia     Past Surgical History Past Surgical History: Procedure Laterality Date  Knee Surgery  Left 2007  Hand Surgery    2009  COLON SURGERY       SBO  HERNIA REPAIR          Allergies Allergies Allergen Reactions  Ibuprofen Other (See Comments)     Other reaction(s): Other (See Comments)  Developed a ulcer    Developed a ulcer   Other reaction(s): Other (See Comments)  Developed a ulcer    Other  reaction(s): Other (See Comments)  Developed a ulcer  Methylprednisolone Anaphylaxis, Palpitations, Shortness Of Breath and Other (See Comments)  Clarithromycin Swelling  Liraglutide Other (See Comments) and Nausea  Sulfamethoxazole-Trimethoprim Hives and Other (See Comments)     Other reaction(s): Other (See Comments)  "blotchiness" redness, face swelling   "blotchiness" redness, face swelling  Hydromorphone (Bulk) Unknown  Metformin Unknown  Sulfa (Sulfonamide Antibiotics) Angioedema and Other (See Comments)     Other reaction(s): Angioedema  Iodinated Contrast Media Hives      Current Outpatient Medications on File Prior to Visit Medication Sig Dispense Refill  alprazolam (XANAX ORAL)        calcium carbonate 500 mg calcium (1,250 mg) tablet Take 500 mg of elemental by mouth 2 (two) times daily with meals      cholecalciferol, vitamin D3, 10 mcg (400 unit) Cap Take by mouth      cyanocobalamin-cobamamide 5,000-100 mcg Lozg Place under the tongue      empagliflozin (JARDIANCE) 25 mg tablet Take 1 tablet by mouth once daily      insulin ASPART PROTAMINE-ASPART (NOVOLOG MIX, 70-30,) 100 unit/mL (70-30) injection Inject subcutaneously      multivitamin tablet Take by mouth      oxyCODONE (ROXICODONE) 15 MG immediate release tablet TAKE 1 TABLET BY MOUTH FIVE TIMES DAILY AS NEEDED      pantoprazole (PROTONIX) 40 MG  DR tablet Take 40 mg by mouth once daily      TRULICITY 1.66 AY/3.0 mL subcutaneous pen injector Inject subcutaneously       No current facility-administered medications on file prior to visit.     Family History History reviewed. No pertinent family history.     Social History   Tobacco Use Smoking Status Former  Types: Cigarettes  Quit date: 1989  Years since quitting: 34.5 Smokeless Tobacco Never     Social History Social History    Socioeconomic History  Marital status: Married Tobacco Use  Smoking status: Former     Types: Cigarettes     Quit  date: 1989     Years since quitting: 34.5  Smokeless tobacco: Never Substance and Sexual Activity  Alcohol use: Not Currently  Drug use: Never      Objective:     Vitals:   09/25/21 1336 BP: 134/78 Pulse: 90 Temp: 36.2 C (97.1 F) SpO2: 99% Weight: 72.7 kg (160 lb 3.2 oz) Height: 160 cm ('5\' 3"'$ )   Body mass index is 28.38 kg/m.   Physical Exam Vitals reviewed.  Constitutional:      General: She is not in acute distress.    Appearance: Normal appearance.  HENT:     Head: Normocephalic and atraumatic.  Eyes:     General: No scleral icterus.    Conjunctiva/sclera: Conjunctivae normal.  Cardiovascular:     Rate and Rhythm: Normal rate and regular rhythm.  Pulmonary:     Effort: Pulmonary effort is normal. No respiratory distress.     Breath sounds: Normal breath sounds. No wheezing.  Abdominal:     General: There is no distension.     Palpations: Abdomen is soft.     Tenderness: There is no abdominal tenderness.     Comments: Well-healed midline laparotomy scar, small right paramedian scar. Hernia is difficult to appreciate on exam.  Musculoskeletal:     Cervical back: Normal range of motion.  Skin:    General: Skin is warm and dry.  Neurological:     General: No focal deficit present.     Mental Status: She is alert and oriented to person, place, and time.  Psychiatric:        Mood and Affect: Mood normal.        Behavior: Behavior normal.          Assessment and Plan: Diagnoses and all orders for this visit:   Epigastric hernia       This is a 66 yo female referred for evaluation of an incisional hernia. I reviewed her referral notes and CT, which shows a small fat-containing hernia defect about 1.2cm in diameter, at midline and about 5cm superior to the umbilicus. It is very difficult to palpate on exam. She does endorse some pain and would like to have it repaired. I discussed the details of hernia repair with possible mesh placement. This may require  laparoscopy with adhesiolysis to identify the defect. I will coordinate with Dr. Erin Hearing to schedule as a combined procedure with his case. Patient will be contacted to schedule an elective surgery date.  Michaelle Birks, [facility], Hepatobiliary and Pancreatic Surgery 09/25/21 4:57 PM

## 2021-09-26 ENCOUNTER — Telehealth: Payer: Self-pay | Admitting: Physician Assistant

## 2021-09-26 MED ORDER — PANTOPRAZOLE SODIUM 40 MG PO TBEC: 40.0000 mg | DELAYED_RELEASE_TABLET | Freq: Every day | ORAL | 0 refills | Status: DC

## 2021-09-26 NOTE — Telephone Encounter (Signed)
Called and advised patient that I have sent in her 90 day supply of Pantoprazole and that she is due for a follow up. She has advised that she is being seen by plastic surgery and that she will be having a scar from a past small bowel obstruction fixed along with a Hernia Repair. I advised her it would be fine to wait until after she has surgery.

## 2021-10-02 ENCOUNTER — Encounter: Payer: Self-pay | Admitting: Family

## 2021-10-02 ENCOUNTER — Encounter: Payer: Self-pay | Admitting: Internal Medicine

## 2021-10-02 DIAGNOSIS — Z794 Long term (current) use of insulin: Secondary | ICD-10-CM

## 2021-10-02 MED ORDER — TRULICITY 0.75 MG/0.5ML ~~LOC~~ SOAJ: 0.7500 mg | SUBCUTANEOUS | 3 refills | Status: DC

## 2021-10-02 NOTE — Telephone Encounter (Signed)
noted 

## 2021-10-08 ENCOUNTER — Encounter: Payer: Self-pay | Admitting: Plastic Surgery

## 2021-10-21 DIAGNOSIS — K5903 Drug induced constipation: Secondary | ICD-10-CM | POA: Diagnosis not present

## 2021-10-21 DIAGNOSIS — F3342 Major depressive disorder, recurrent, in full remission: Secondary | ICD-10-CM | POA: Diagnosis not present

## 2021-10-21 DIAGNOSIS — E1140 Type 2 diabetes mellitus with diabetic neuropathy, unspecified: Secondary | ICD-10-CM | POA: Diagnosis not present

## 2021-10-21 DIAGNOSIS — G47429 Narcolepsy in conditions classified elsewhere without cataplexy: Secondary | ICD-10-CM | POA: Diagnosis not present

## 2021-10-21 DIAGNOSIS — M545 Low back pain, unspecified: Secondary | ICD-10-CM | POA: Diagnosis not present

## 2021-10-21 DIAGNOSIS — Z79899 Other long term (current) drug therapy: Secondary | ICD-10-CM | POA: Diagnosis not present

## 2021-10-21 DIAGNOSIS — Z9181 History of falling: Secondary | ICD-10-CM | POA: Diagnosis not present

## 2021-10-21 DIAGNOSIS — T402X5A Adverse effect of other opioids, initial encounter: Secondary | ICD-10-CM | POA: Diagnosis not present

## 2021-10-21 DIAGNOSIS — N1831 Chronic kidney disease, stage 3a: Secondary | ICD-10-CM | POA: Diagnosis not present

## 2021-10-21 DIAGNOSIS — E663 Overweight: Secondary | ICD-10-CM | POA: Diagnosis not present

## 2021-10-21 DIAGNOSIS — Z131 Encounter for screening for diabetes mellitus: Secondary | ICD-10-CM | POA: Diagnosis not present

## 2021-10-24 DIAGNOSIS — N2581 Secondary hyperparathyroidism of renal origin: Secondary | ICD-10-CM | POA: Diagnosis not present

## 2021-10-24 DIAGNOSIS — N1831 Chronic kidney disease, stage 3a: Secondary | ICD-10-CM | POA: Diagnosis not present

## 2021-10-24 DIAGNOSIS — I129 Hypertensive chronic kidney disease with stage 1 through stage 4 chronic kidney disease, or unspecified chronic kidney disease: Secondary | ICD-10-CM | POA: Diagnosis not present

## 2021-10-24 DIAGNOSIS — D631 Anemia in chronic kidney disease: Secondary | ICD-10-CM | POA: Diagnosis not present

## 2021-10-24 DIAGNOSIS — N390 Urinary tract infection, site not specified: Secondary | ICD-10-CM | POA: Diagnosis not present

## 2021-10-24 DIAGNOSIS — Z79899 Other long term (current) drug therapy: Secondary | ICD-10-CM | POA: Diagnosis not present

## 2021-11-05 DIAGNOSIS — F411 Generalized anxiety disorder: Secondary | ICD-10-CM | POA: Diagnosis not present

## 2021-11-05 DIAGNOSIS — Z79899 Other long term (current) drug therapy: Secondary | ICD-10-CM | POA: Diagnosis not present

## 2021-11-14 DIAGNOSIS — Z1231 Encounter for screening mammogram for malignant neoplasm of breast: Secondary | ICD-10-CM | POA: Diagnosis not present

## 2021-11-14 DIAGNOSIS — Z780 Asymptomatic menopausal state: Secondary | ICD-10-CM | POA: Diagnosis not present

## 2021-11-14 DIAGNOSIS — M85852 Other specified disorders of bone density and structure, left thigh: Secondary | ICD-10-CM | POA: Diagnosis not present

## 2021-11-14 DIAGNOSIS — M85851 Other specified disorders of bone density and structure, right thigh: Secondary | ICD-10-CM | POA: Diagnosis not present

## 2021-11-14 LAB — HM DEXA SCAN

## 2021-11-14 LAB — HM MAMMOGRAPHY

## 2021-11-20 DIAGNOSIS — K5903 Drug induced constipation: Secondary | ICD-10-CM | POA: Diagnosis not present

## 2021-11-20 DIAGNOSIS — Z9181 History of falling: Secondary | ICD-10-CM | POA: Diagnosis not present

## 2021-11-20 DIAGNOSIS — F3342 Major depressive disorder, recurrent, in full remission: Secondary | ICD-10-CM | POA: Diagnosis not present

## 2021-11-20 DIAGNOSIS — T402X5A Adverse effect of other opioids, initial encounter: Secondary | ICD-10-CM | POA: Diagnosis not present

## 2021-11-20 DIAGNOSIS — E1140 Type 2 diabetes mellitus with diabetic neuropathy, unspecified: Secondary | ICD-10-CM | POA: Diagnosis not present

## 2021-11-20 DIAGNOSIS — M545 Low back pain, unspecified: Secondary | ICD-10-CM | POA: Diagnosis not present

## 2021-11-20 DIAGNOSIS — Z79899 Other long term (current) drug therapy: Secondary | ICD-10-CM | POA: Diagnosis not present

## 2021-11-20 DIAGNOSIS — E663 Overweight: Secondary | ICD-10-CM | POA: Diagnosis not present

## 2021-11-20 DIAGNOSIS — G47429 Narcolepsy in conditions classified elsewhere without cataplexy: Secondary | ICD-10-CM | POA: Diagnosis not present

## 2021-11-20 DIAGNOSIS — N1831 Chronic kidney disease, stage 3a: Secondary | ICD-10-CM | POA: Diagnosis not present

## 2021-11-22 DIAGNOSIS — Z79899 Other long term (current) drug therapy: Secondary | ICD-10-CM | POA: Diagnosis not present

## 2021-11-26 DIAGNOSIS — R070 Pain in throat: Secondary | ICD-10-CM | POA: Diagnosis not present

## 2021-12-03 ENCOUNTER — Ambulatory Visit: Payer: Self-pay

## 2021-12-03 NOTE — Telephone Encounter (Signed)
   Chief Complaint: Abdominal pain Symptoms: pain in abdomen and back Frequency: Ongoing Pertinent Negatives: Patient denies Fever, chest pai SOB Disposition: '[]'$ ED /'[]'$ Urgent Care (no appt availability in office) / '[x]'$ Appointment(In office/virtual)/ '[]'$  [facility]/ '[]'$ Home Care/ '[]'$ Refused Recommended Disposition /'[]'$  Mobile Bus/ '[]'$  Follow-up with PCP Additional Notes: Pt has had ongoing abdominal pain. She has had a bowel obstruction and has diverticulitis. PT states that the current pain is not like either of these. Pt has has a hernia. Pt is passing gas and stool.   PT has not heard back from surgeon regarding open wound on stomach and hernia. Pt is not sure why she has not been contacted.  Please advise.   Reason for Disposition  Abdominal pain is a chronic symptom (recurrent or ongoing AND present > 4 weeks)  Answer Assessment - Initial Assessment Questions 1. LOCATION: "Where does it hurt?"      Abdomen - above belly button -  right side. 2. RADIATION: "Does the pain shoot anywhere else?" (e.g., chest, back)     To the back 3. ONSET: "When did the pain begin?" (e.g., minutes, hours or days ago)      Off and on. 6 months 4. SUDDEN: "Gradual or sudden onset?"     Gradual 5. PATTERN "Does the pain come and go, or is it constant?"    - If it comes and goes: "How long does it last?" "Do you have pain now?"     (Note: Comes and goes means the pain is intermittent. It goes away completely between bouts.)    - If constant: "Is it getting better, staying the same, or getting worse?"      (Note: Constant means the pain never goes away completely; most serious pain is constant and gets worse.)      Comes and goes - had been gone for a month - now back 6. SEVERITY: "How bad is the pain?"  (e.g., Scale 1-10; mild, moderate, or severe)    - MILD (1-3): Doesn't interfere with normal activities, abdomen soft and not tender to touch.     - MODERATE (4-7): Interferes with  normal activities or awakens from sleep, abdomen tender to touch.     - SEVERE (8-10): Excruciating pain, doubled over, unable to do any normal activities.       10/10 7. RECURRENT SYMPTOM: "Have you ever had this type of stomach pain before?" If Yes, ask: "When was the last time?" and "What happened that time?"      yes 8. CAUSE: "What do you think is causing the stomach pain?"     Unsure.  9. RELIEVING/AGGRAVATING FACTORS: "What makes it better or worse?" (e.g., antacids, bending or twisting motion, bowel movement)     Ice and heat. Hemp ointment on her abdomen and back. 10. OTHER SYMPTOMS: "Do you have any other symptoms?" (e.g., back pain, diarrhea, fever, urination pain, vomiting)       no 11. PREGNANCY: "Is there any chance you are pregnant?" "When was your last menstrual period?"  Protocols used: Abdominal Pain - [facility]

## 2021-12-03 NOTE — Progress Notes (Signed)
Patient ID: Courtney [name], female    DOB: 06-21-1955  MRN: [id number]  CC: Letter   Subjective: Courtney [name] is a 66 y.o. female who presents for letter.  Her concerns today include:  12/03/2021 per triage RN note: Chief Complaint: Abdominal pain Symptoms: pain in abdomen and back Frequency: Ongoing Pertinent Negatives: Patient denies Fever, chest pai SOB Disposition: '[]'$ ED /'[]'$ Urgent Care (no appt availability in office) / '[x]'$ Appointment(In office/virtual)/ '[]'$  [facility]/ '[]'$ Home Care/ '[]'$ Refused Recommended Disposition /'[]'$ [facility]/ '[]'$  Follow-up with PCP Additional Notes: Pt has had ongoing abdominal pain. She has had a bowel obstruction and has diverticulitis. PT states that the current pain is not like either of these. Pt has has a hernia. Pt is passing gas and stool.    PT has not heard back from surgeon regarding open wound on stomach and hernia. Pt is not sure why she has not been contacted.   Please advise.   Reason for Disposition  Abdominal pain is a chronic symptom (recurrent or ongoing AND present > 4 weeks)  Answer Assessment - Initial Assessment Questions 1. LOCATION: "Where does it hurt?"      Abdomen - above belly button -  right side. 2. RADIATION: "Does the pain shoot anywhere else?" (e.g., chest, back)     To the back 3. ONSET: "When did the pain begin?" (e.g., minutes, hours or days ago)      Off and on. 6 months 4. SUDDEN: "Gradual or sudden onset?"     Gradual 5. PATTERN "Does the pain come and go, or is it constant?"    - If it comes and goes: "How long does it last?" "Do you have pain now?"     (Note: Comes and goes means the pain is intermittent. It goes away completely between bouts.)    - If constant: "Is it getting better, staying the same, or getting worse?"      (Note: Constant means the pain never goes away completely; most serious pain is constant and gets worse.)      Comes and goes - had been gone for a  month - now back 6. SEVERITY: "How bad is the pain?"  (e.g., Scale 1-10; mild, moderate, or severe)    - MILD (1-3): Doesn't interfere with normal activities, abdomen soft and not tender to touch.     - MODERATE (4-7): Interferes with normal activities or awakens from sleep, abdomen tender to touch.     - SEVERE (8-10): Excruciating pain, doubled over, unable to do any normal activities.       10/10 7. RECURRENT SYMPTOM: "Have you ever had this type of stomach pain before?" If Yes, ask: "When was the last time?" and "What happened that time?"      yes 8. CAUSE: "What do you think is causing the stomach pain?"     Unsure.  9. RELIEVING/AGGRAVATING FACTORS: "What makes it better or worse?" (e.g., antacids, bending or twisting motion, bowel movement)     Ice and heat. Hemp ointment on her abdomen and back. 10. OTHER SYMPTOMS: "Do you have any other symptoms?" (e.g., back pain, diarrhea, fever, urination pain, vomiting)       no 11. PREGNANCY: "Is there any chance you are pregnant?" "When was your last menstrual period?"  Protocols used: Abdominal Pain - Female-A-AH  Today's visit 12/10/2021: - Established with General Surgeon.  - Established with Gastroenterology and has appointment upcoming. Plans to discussed abdominal pain and chronic  constipation. Has tried several medications without relief.  - Right lower back pain. Thinks secondary to total hysterectomy several years ago. Reports thinks some of her right ovary was left inside. She is established with Orthopedics for management of back and shoulder pain. Ready for referral back to Gynecology for routine women's health maintenance.  - Established with Nephrology. Reports last appointment was 2 months ago and instructed to follow-up annually.  - Established at [facility] for pain management.  - Reports she is not feeling well due to chronic conditions. Recently celebrated her birthday and was scheduled to take a trip via [employer] (scheduled to leave tomorrow at 5 am) to [place]. States she doesn't feel well enough to fly and requesting letter to submit to the airline so that she can receive a refund for airplane tickets.    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   Sebaceous cyst 05/03/2021   Seborrheic dermatitis of scalp 05/03/2021   Biceps tendonitis on left 04/30/2021   Stage 3a chronic kidney disease ([place]) 10/21/2020   Type 2 diabetes mellitus with stage 3a chronic kidney disease, with long-term current use of insulin ([place]) 10/21/2020   Type 2 diabetes mellitus with diabetic polyneuropathy, with long-term current use of insulin ([place]) 06/20/2020   Dyslipidemia, goal LDL below 70 04/10/2020   Episode of recurrent major depressive disorder ([place]) 04/10/2020   Pain in left shoulder 04/04/2020   Vaginal dryness, menopausal 07/07/2019   Tremor 05/30/2019   Memory loss 05/03/2019   History of colonic polyps 03/16/2019   Fracture of distal phalanx of right ring finger 03/15/2019   Facet arthritis, degenerative, 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   Polyp of transverse colon    Special screening for malignant neoplasms, colon    Polyp of sigmoid colon    Anxiety 09/09/2018   GERD (gastroesophageal reflux disease) 09/09/2018   Cervicalgia 08/10/2018   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   Laryngopharyngeal reflux (LPR) 05/14/2018   Throat pain 05/14/2018   Loss of weight    RLQ abdominal pain 03/27/2017   Bloating 03/27/2017   Abnormal finding on imaging 03/27/2017   Dilation of biliary tract 03/27/2017   Nausea 03/27/2017   Chronic sinusitis 03/19/2017   Multiple joint pain 03/31/2016   Essential tremor 03/31/2016   Numbness in both hands 03/18/2016   Hypersomnia 11/09/2015    Diabetes mellitus ([place]) 07/18/2015   Abnormal ECG 07/18/2015   Gait difficulty 02/09/2015   Chronic left shoulder pain 02/09/2015   Generalized anxiety disorder 02/09/2015   Numbness 02/09/2015   Insomnia 02/09/2015   Urinary hesitancy 02/09/2015   Other fatigue 02/09/2015   Fibromyalgia 02/09/2015   Essential hypertension 09/01/2011   Coronary artery disease involving native coronary artery of native heart without angina pectoris 09/01/2011   MS (multiple sclerosis) ([place]) 09/01/2011   Parkinson's disease 09/01/2011   Type 2 diabetes mellitus without complications ([place]) 97/98/9211   Chronic pain 09/01/2011     Current Outpatient Medications on File Prior to Visit  Medication Sig Dispense Refill   acetaminophen (TYLENOL) 500 MG tablet Take 1,000 mg by mouth every 8 (eight) hours as needed for headache.     alprazolam (XANAX) 2 MG tablet Take 2 mg by mouth 3 (three) times daily as needed for anxiety.  citalopram (CELEXA) 20 MG tablet Take by mouth.     cyclobenzaprine (FLEXERIL) 5 MG tablet Take 5 mg by mouth every 4 (four) hours as needed.     Dulaglutide (TRULICITY) 7.40 CX/4.4YJ SOPN Inject 0.75 mg into the skin once a week. 6 mL 3   empagliflozin (JARDIANCE) 25 MG TABS tablet Take 1 tablet (25 mg total) by mouth daily. 90 tablet 3   esomeprazole (NEXIUM) 40 MG capsule Take 40 mg by mouth 2 (two) times daily.     glucose blood (FREESTYLE LITE) test strip 1 each by Other route in the morning and at bedtime. Use as instructed 200 each 3   insulin aspart protamine - aspart (NOVOLOG 70/30 MIX) (70-30) 100 UNIT/ML FlexPen Inject 20 Units into the skin daily with breakfast AND 22 Units daily with supper. 30 mL 3   Insulin Pen Needle 32G X 4 MM MISC 1 Device by Does not apply route in the morning and at bedtime. 200 each 3   Lancets (FREESTYLE) lancets 1 Device by Misc.(Non-Drug; Combo Route) route daily. USED TO CHECK BLOOD SUGAR TID     loratadine (CLARITIN) 10 MG tablet Take by mouth.      Multiple Vitamin (MULTIVITAMIN) tablet Take 2 tablets by mouth daily. Gummies     naloxone (NARCAN) nasal spray 4 mg/0.1 mL Place 1 spray into the nose daily as needed (overdose).     oxyCODONE (ROXICODONE) 15 MG immediate release tablet Take 15 mg by mouth 5 (five) times daily as needed.     pantoprazole (PROTONIX) 40 MG tablet Take 1 tablet (40 mg total) by mouth daily. 90 tablet 0   [DISCONTINUED] sucralfate (CARAFATE) 1 g tablet Take 1 tablet (1 g total) by mouth 4 (four) times daily -  before meals and at bedtime. Dissolve 1 tablet in 2-4 oz of water, drink as slurry 120 tablet 1   No current facility-administered medications on file prior to visit.    Allergies  Allergen Reactions   Ibuprofen Other (See Comments)    Developed a ulcer  Other reaction(s): Other (See Comments) Developed a ulcer    Methylprednisolone Anaphylaxis, Shortness Of Breath, Palpitations and Other (See Comments)   Prednisone Anaphylaxis, Shortness Of Breath, Palpitations and Other (See Comments)   Bactrim [Sulfamethoxazole-Trimethoprim] Other (See Comments)    "blotchiness" redness, face swelling   Biaxin [Clarithromycin] Swelling   Naproxen Other (See Comments)    Developed a ulcer    Sulfa Antibiotics Other (See Comments) and Swelling    Other reaction(s): Angioedema Other reaction(s): Angioedema    Social History   Socioeconomic History   Marital status: Married    Spouse name: Jimmie   Number of children: 4   Years of education: Not on file   Highest education level: 12th grade  Occupational History   Occupation: disabled  Tobacco Use   Smoking status: Former    Packs/day: 0.25    Years: 5.00    Total pack years: 1.25    Types: Cigarettes    Quit date: 1990    Years since quitting: 33.7    Passive exposure: Never   Smokeless tobacco: Never  Vaping Use   Vaping Use: Never used  Substance and Sexual Activity   Alcohol use: Yes    Comment: rarely   Drug use: No   Sexual activity:  Yes  Other Topics Concern   Not on file  Social History Narrative   Pt is right-handed. She lives with her husband in a 2 story  house. Pt avoids caffeine. She and her husband go to the gym, lately once weekly, previously 3 times a week.    Social Determinants of Health   Financial Resource Strain: Not on file  Food Insecurity: Not on file  Transportation Needs: Not on file  Physical Activity: Not on file  Stress: Not on file  Social Connections: Not on file  Intimate Partner Violence: Not on file    Family History  Problem Relation Age of Onset   Heart disease Mother    Kidney disease Mother    Diabetes Mother    Other Father 18       murdered   Multiple sclerosis Daughter    Multiple sclerosis Other    Diabetes Sister    Alcohol abuse Brother        ETOH and marijuana   Diabetes Brother    Diabetes Sister    Diabetes Brother    ALS Brother     Past Surgical History:  Procedure Laterality Date   ABDOMINAL HYSTERECTOMY  1976   including oophorectomy, due to uterine cancer   APPENDECTOMY  1974   BIOPSY  01/31/2019   Procedure: BIOPSY;  Surgeon: Mauri Pole, MD;  Location: [facility];  Service: Endoscopy;;   CARPAL TUNNEL RELEASE Left    carpal tunnel surgery   CHOLECYSTECTOMY     2006   COLON SURGERY     SBO x 2   COLONOSCOPY WITH PROPOFOL N/A 01/31/2019   Procedure: COLONOSCOPY WITH PROPOFOL;  Surgeon: Mauri Pole, MD;  Location: [facility];  Service: Endoscopy;  Laterality: N/A;   EUS N/A 04/09/2017   Procedure: UPPER ENDOSCOPIC ULTRASOUND (EUS) LINEAR;  Surgeon: Milus Banister, MD;  Location: [facility];  Service: Endoscopy;  Laterality: N/A;   HERNIA REPAIR     MENISCUS REPAIR Left    POLYPECTOMY  01/31/2019   Procedure: POLYPECTOMY;  Surgeon: Mauri Pole, MD;  Location: [facility];  Service: Endoscopy;;   SMALL INTESTINE SURGERY     toenail removal Right 12/2017   great toenail     ROS: Review of Systems Negative except  as stated above  PHYSICAL EXAM: BP 137/83 (BP Location: Left Arm, Patient Position: Sitting, Cuff Size: Normal)   Pulse 74   Temp 98.3 F (36.8 C)   Resp 16   Ht 5' 2.99" (1.6 m)   Wt 158 lb (71.7 kg)   SpO2 92%   BMI 28.00 kg/m   Physical Exam HENT:     Head: Normocephalic and atraumatic.  Eyes:     Extraocular Movements: Extraocular movements intact.     Conjunctiva/sclera: Conjunctivae normal.     Pupils: Pupils are equal, round, and reactive to light.  Cardiovascular:     Rate and Rhythm: Normal rate and regular rhythm.     Pulses: Normal pulses.     Heart sounds: Normal heart sounds.  Pulmonary:     Effort: Pulmonary effort is normal.     Breath sounds: Normal breath sounds.  Musculoskeletal:     Cervical back: Normal range of motion and neck supple.  Neurological:     General: No focal deficit present.     Mental Status: She is alert and oriented to person, place, and time.  Psychiatric:        Mood and Affect: Mood normal.        Behavior: Behavior normal.     ASSESSMENT AND PLAN: Note - Discussed with patient to keep all scheduled appointments with General  Surgeon, Gastroenterology, Nephrology, Orthopedics, and Pain Management as scheduled. Patient verbalized understanding.   1. Encounter for completion of form with patient - Patient provided with letter to excuse travel.  2. History of hysterectomy 3. Women's annual routine gynecological examination - Referral to Gynecology for further evaluation and management.  - Ambulatory referral to Gynecology   Patient was given the opportunity to ask questions.  Patient verbalized understanding of the plan and was able to repeat key elements of the plan. Patient was given clear instructions to go to Emergency Department or return to medical center if symptoms don't improve, worsen, or new problems develop.The patient verbalized understanding.   Orders Placed This Encounter  Procedures   Ambulatory referral to  Gynecology    Follow-up with primary provider as scheduled.   Camillia Herter, NP

## 2021-12-03 NOTE — Telephone Encounter (Signed)
Message sent in [place] to pt advising her to contact [facility]

## 2021-12-05 ENCOUNTER — Encounter: Payer: Self-pay | Admitting: Internal Medicine

## 2021-12-05 ENCOUNTER — Ambulatory Visit (INDEPENDENT_AMBULATORY_CARE_PROVIDER_SITE_OTHER): Payer: Medicare Other | Admitting: Internal Medicine

## 2021-12-05 VITALS — BP 134/88 | HR 75 | Ht 63.0 in | Wt 158.6 lb

## 2021-12-05 DIAGNOSIS — Z79899 Other long term (current) drug therapy: Secondary | ICD-10-CM | POA: Diagnosis not present

## 2021-12-05 DIAGNOSIS — R6889 Other general symptoms and signs: Secondary | ICD-10-CM | POA: Diagnosis not present

## 2021-12-05 DIAGNOSIS — N1831 Chronic kidney disease, stage 3a: Secondary | ICD-10-CM

## 2021-12-05 DIAGNOSIS — F39 Unspecified mood [affective] disorder: Secondary | ICD-10-CM | POA: Diagnosis not present

## 2021-12-05 DIAGNOSIS — E1122 Type 2 diabetes mellitus with diabetic chronic kidney disease: Secondary | ICD-10-CM

## 2021-12-05 DIAGNOSIS — E1142 Type 2 diabetes mellitus with diabetic polyneuropathy: Secondary | ICD-10-CM

## 2021-12-05 DIAGNOSIS — Z794 Long term (current) use of insulin: Secondary | ICD-10-CM | POA: Diagnosis not present

## 2021-12-05 DIAGNOSIS — F411 Generalized anxiety disorder: Secondary | ICD-10-CM | POA: Diagnosis not present

## 2021-12-05 LAB — POCT GLYCOSYLATED HEMOGLOBIN (HGB A1C): Hemoglobin A1C: 7.2 % — AB (ref 4.0–5.6)

## 2021-12-05 LAB — TSH: TSH: 1.1 u[IU]/mL (ref 0.35–5.50)

## 2021-12-05 LAB — T4, FREE: Free T4: 0.95 ng/dL (ref 0.60–1.60)

## 2021-12-05 NOTE — Patient Instructions (Signed)
-   STOP Trulicity  - Continue Jardiance 25 mg 1 tablet daily  - Change  Novolog Mix 20 units with breakfast and 22 units with supper      HOW TO TREAT LOW BLOOD SUGARS (Blood sugar LESS THAN 70 MG/DL) Please follow the RULE OF 15 for the treatment of hypoglycemia treatment (when your (blood sugars are less than 70 mg/dL)   STEP 1: Take 15 grams of carbohydrates when your blood sugar is low, which includes:  3-4 GLUCOSE TABS  OR 3-4 OZ OF JUICE OR REGULAR SODA OR ONE TUBE OF GLUCOSE GEL    STEP 2: RECHECK blood sugar in 15 MINUTES STEP 3: If your blood sugar is still low at the 15 minute recheck --> then, go back to STEP 1 and treat AGAIN with another 15 grams of carbohydrates.

## 2021-12-05 NOTE — Progress Notes (Signed)
Name: Courtney [name]  Age/ Sex: 66 y.o., female   MRN/ DOB: [id number], September 26, 1955     PCP: Camillia Herter, NP   Reason for Endocrinology Evaluation: Type 2 Diabetes Mellitus  Initial Endocrine Consultative Visit: 06/20/2020    PATIENT IDENTIFIER: Courtney [name] is a 66 y.o. female with a past medical history of T2DM, MS , HTN and essential tremors. The patient has followed with Endocrinology clinic since 06/20/20 for consultative assistance with management of her diabetes.  DIABETIC HISTORY:  Courtney [name] was diagnosed with DM in 2008, Victoza caused nausea. Has been on insulin since 2011. Her hemoglobin A1c has ranged from 7.3% in 2017, peaking at 8.0% in years ago.   On her initial visit to our clinic she had an A1c of 6.4% , she was on Jardiance and novolog mix . She was having recurrent hypoglycemia at the time, we reduced insulin and continued Jardiance    Started Trulicity 06/9824  SUBJECTIVE:   During the last visit (08/05/2021): A1c 6.8%        Today (12/05/2021): Courtney [name] is here for a follow up on diabetes management.  She checks her blood sugars daily . The patient has had hypoglycemia episodes, she is symptomatic with these episodes    She was seen by ENT for chronic sore throat, fiberoptic exam of the pharynx and larynx was unremarkable and she was advised to change her PPI 11/2021 She was seen by psychiatry 11/05/2021 for generalized anxiety disorder She was evaluated by general surgery 09/25/2021 for ventral hernia  She is complaining of stomach issues including borborygmi  and decrease appetite  She is having hot flashes , nervous and shaky and attributes this to medication intake  She had a BG reading of >200 mg/dL and took an additional 10 units of insulin which brought it down She has right abdominal pain that radiates to the back , was seen by nephrology    HOME DIABETES REGIMEN:  Jardiance 25 mg 1 tablet daily  Trulicity 4.15 mg  weekly Novolog Mix  18 units with breakfast and 24 units  supper      Statin: no ACE-I/ARB: no Prior Diabetic Education: no   METER DOWNLOAD SUMMARY: unable to download  98-226 mg/dL      DIABETIC COMPLICATIONS: Microvascular complications:  Neuropathy Denies: retinopathy, CKD Last Eye Exam: Completed 02/05/2021  Macrovascular complications:   Denies: CAD, CVA, PVD   HISTORY:  Past Medical History:  Past Medical History:  Diagnosis Date   Abnormal ECG 07/18/2015   Arthritis    Asthma    Bowel obstruction ([place])    Depression with anxiety 02/09/2015   mild xanax prn   Diabetes mellitus ([place]) 07/18/2015   type 2   Diverticulitis    Dysrhythmia    abnormal ekgs worked up   Essential tremor 03/31/2016   Fibromyalgia    Gait difficulty 02/09/2015   Gallstones    GERD (gastroesophageal reflux disease)    HLD (hyperlipidemia)    Hypersomnia 11/09/2015   Hypertension    no meds   Hyperthyroidism    patient states has no problems at present-01/25/2019   Hypothyroidism    patient states no problems at present 01/25/2019   IBS (irritable bowel syndrome)    Insomnia 02/09/2015   Migraines    MS (multiple sclerosis) ([place])    Multiple joint pain 03/31/2016   Numbness in both hands 03/18/2016   Other fatigue 02/09/2015   Peptic ulcer  Pneumonia    PONV (postoperative nausea and vomiting)    Urinary hesitancy 02/09/2015   Vision abnormalities    Past Surgical History:  Past Surgical History:  Procedure Laterality Date   ABDOMINAL HYSTERECTOMY  1976   including oophorectomy, due to uterine cancer   APPENDECTOMY  1974   BIOPSY  01/31/2019   Procedure: BIOPSY;  Surgeon: Mauri Pole, MD;  Location: [facility];  Service: Endoscopy;;   CARPAL TUNNEL RELEASE Left    carpal tunnel surgery   CHOLECYSTECTOMY     2006   COLON SURGERY     SBO x 2   COLONOSCOPY WITH PROPOFOL N/A 01/31/2019   Procedure: COLONOSCOPY WITH PROPOFOL;  Surgeon: Mauri Pole, MD;   Location: [facility];  Service: Endoscopy;  Laterality: N/A;   EUS N/A 04/09/2017   Procedure: UPPER ENDOSCOPIC ULTRASOUND (EUS) LINEAR;  Surgeon: Milus Banister, MD;  Location: [facility];  Service: Endoscopy;  Laterality: N/A;   HERNIA REPAIR     MENISCUS REPAIR Left    POLYPECTOMY  01/31/2019   Procedure: POLYPECTOMY;  Surgeon: Mauri Pole, MD;  Location: [facility];  Service: Endoscopy;;   SMALL INTESTINE SURGERY     toenail removal Right 12/2017   great toenail    Social History:  reports that she quit smoking about 33 years ago. Her smoking use included cigarettes. She has a 1.25 pack-year smoking history. She has never used smokeless tobacco. She reports current alcohol use. She reports that she does not use drugs. Family History:  Family History  Problem Relation Age of Onset   Heart disease Mother    Kidney disease Mother    Diabetes Mother    Other Father 52       murdered   Multiple sclerosis Daughter    Multiple sclerosis Other    Diabetes Sister    Alcohol abuse Brother        ETOH and marijuana   Diabetes Brother    Diabetes Sister    Diabetes Brother    ALS Brother      HOME MEDICATIONS: Allergies as of 12/05/2021       Reactions   Ibuprofen Other (See Comments)   Developed a ulcer  Other reaction(s): Other (See Comments) Developed a ulcer    Methylprednisolone Anaphylaxis, Shortness Of Breath, Palpitations, Other (See Comments)   Prednisone Anaphylaxis, Shortness Of Breath, Palpitations, Other (See Comments)   Bactrim [sulfamethoxazole-trimethoprim] Other (See Comments)   "blotchiness" redness, face swelling   Biaxin [clarithromycin] Swelling   Naproxen Other (See Comments)   Developed a ulcer    Sulfa Antibiotics Other (See Comments), Swelling   Other reaction(s): Angioedema Other reaction(s): Angioedema        Medication List        Accurate as of December 05, 2021 10:19 AM. If you have any questions, ask your nurse or doctor.           acetaminophen 500 MG tablet Commonly known as: TYLENOL Take 1,000 mg by mouth every 8 (eight) hours as needed for headache.   alprazolam 2 MG tablet Commonly known as: XANAX Take 2 mg by mouth 3 (three) times daily as needed for anxiety.   cyclobenzaprine 5 MG tablet Commonly known as: FLEXERIL Take 5 mg by mouth every 4 (four) hours as needed.   empagliflozin 25 MG Tabs tablet Commonly known as: JARDIANCE Take 1 tablet (25 mg total) by mouth daily.   esomeprazole 40 MG capsule Commonly known as: NEXIUM Take 40  mg by mouth 2 (two) times daily.   freestyle lancets 1 Device by Misc.(Non-Drug; Combo Route) route daily. USED TO CHECK BLOOD SUGAR TID   FREESTYLE LITE test strip Generic drug: glucose blood 1 each by Other route in the morning and at bedtime. Use as instructed   insulin aspart protamine - aspart (70-30) 100 UNIT/ML FlexPen Commonly known as: NOVOLOG 70/30 MIX Inject 18 Units into the skin daily with breakfast AND 20 Units daily with supper.   Insulin Pen Needle 32G X 4 MM Misc 1 Device by Does not apply route in the morning and at bedtime.   loratadine 10 MG tablet Commonly known as: CLARITIN Take by mouth.   multivitamin tablet Take 2 tablets by mouth daily. Gummies   naloxone 4 MG/0.1ML Liqd nasal spray kit Commonly known as: NARCAN Place 1 spray into the nose daily as needed (overdose).   oxyCODONE 15 MG immediate release tablet Commonly known as: ROXICODONE Take 15 mg by mouth 5 (five) times daily as needed.   pantoprazole 40 MG tablet Commonly known as: PROTONIX Take 1 tablet (40 mg total) by mouth daily.   Trulicity 7.16 RC/7.8LF Sopn Generic drug: Dulaglutide Inject 0.75 mg into the skin once a week.         OBJECTIVE:   Vital Signs: BP 134/88 (BP Location: Left Arm, Patient Position: Sitting, Cuff Size: Small)   Pulse 75   Ht _0  (1.6 m)   Wt 158 lb 9.6 oz (71.9 kg)   BMI 28.09 kg/m   Wt Readings from Last 3  Encounters:  12/05/21 158 lb 9.6 oz (71.9 kg)  08/29/21 158 lb 3.2 oz (71.8 kg)  08/05/21 163 lb 12.8 oz (74.3 kg)     Exam: General: Pt appears well and is in NAD  Lungs: Clear with good BS bilat with no rales, rhonchi, or wheezes  Heart: RRR  Abdomen: Normoactive bowel sounds, soft, nontender, without masses or organomegaly palpable  Extremities: No pretibial edema.   Neuro: MS is good with appropriate affect, pt is alert and Ox3   DM Foot Exam 01/28/2021   The skin of the feet is intact without sores or ulcerations. The pedal pulses are 2+ on right and 2+ on left. The sensation is intact to a screening 5.07, 10 gram monofilament bilaterally    DATA REVIEWED:  Lab Results  Component Value Date   HGBA1C 7.2 (A) 12/05/2021   HGBA1C 6.8 (A) 08/05/2021   HGBA1C 6.5 (A) 01/28/2021     ASSESSMENT / PLAN / RECOMMENDATIONS:   1) Type 2 Diabetes Mellitus, Optimally controlled, With neuropathic and CKD III complications - Most recent A1c of 7.2 %. Goal A1c < 7.0 %.      - A1c slightly above goal  -Intolerant to metformin and Victoza in the past -She is having multiple symptoms that she attributes to Trulicity, will discontinue  -We discussed CGM technology but she has history of adhesive irritation and not keen at this time -I did advise the patient against using extra insulin for correction as she is on the insulin mix and this will increase her risk of hypoglycemia.  We discussed switching insulin to mix to basal/prandial but she understands that she will need to give herself 3-4 injections daily, and she will be required to check her glucose at least 3 times daily before each meal -The patient opted to remain on the NovoLog Mix and we will make the following increase -In the meantime she will work on checking  her glucose twice daily for now   MEDICATIONS: - STOP Trulicity  - Continue Jardiance 25 mg 1 tablet daily  - Increase  Novolog 20  units with breakfast and 22  units with  supper     EDUCATION / INSTRUCTIONS: BG monitoring instructions: Patient is instructed to check her blood sugars 2 times a day, breakfast and supper. Call [facility] if: BG persistently < 70  I reviewed the Rule of 15 for the treatment of hypoglycemia in detail with the patient. Literature supplied.   2) Diabetic complications:  Eye: Does not have known diabetic retinopathy.  Neuro/ Feet: Does  have known diabetic peripheral neuropathy .  Renal: Patient does not have known baseline CKD. She   is not on an ACEI/ARB at present.      3) Lipids: Patient is has been off statins due to severe cramps. LDL 89 mg/dL during labs 05/2021.  In the past she has declined statin therapy  due to cramps     4) Heat intolerance:  -TFTs are normal -I am not aware of Trulicity causing hot flashes, but she is going to stop it due to other GI symptoms  F/U in 6 months      Signed electronically by: Mack Guise, MD  [facility]  [facility] [address] [place], [place] [address] Phone: [phone] FAX: [phone]   CC: Camillia Herter, NP [place] [address] Phone: [phone]  Fax: [phone]  Return to Endocrinology clinic as below: Future Appointments  Date Time Provider [place]  12/10/2021  1:40 PM Camillia Herter, NP [facility] None

## 2021-12-06 MED ORDER — INSULIN ASPART PROT & ASPART (70-30 MIX) 100 UNIT/ML PEN
PEN_INJECTOR | SUBCUTANEOUS | 3 refills | Status: DC
Start: 2021-12-06 — End: 2022-06-06

## 2021-12-09 ENCOUNTER — Telehealth: Payer: Self-pay | Admitting: Physician Assistant

## 2021-12-09 NOTE — Telephone Encounter (Signed)
Inbound call from patient stating that she is having issues with abdominal pain and nausea. Patient was scheduled for a office visit with Amy on 11/22 at 11:00 and is seeking advice in the meantime if there is anything that will help until her appointment. Please advise.

## 2021-12-10 ENCOUNTER — Encounter: Payer: Self-pay | Admitting: Family

## 2021-12-10 ENCOUNTER — Ambulatory Visit (INDEPENDENT_AMBULATORY_CARE_PROVIDER_SITE_OTHER): Payer: Medicare Other | Admitting: Family

## 2021-12-10 VITALS — BP 137/83 | HR 74 | Temp 98.3°F | Resp 16 | Ht 62.99 in | Wt 158.0 lb

## 2021-12-10 DIAGNOSIS — Z90710 Acquired absence of both cervix and uterus: Secondary | ICD-10-CM

## 2021-12-10 DIAGNOSIS — Z01419 Encounter for gynecological examination (general) (routine) without abnormal findings: Secondary | ICD-10-CM | POA: Diagnosis not present

## 2021-12-10 DIAGNOSIS — Z0289 Encounter for other administrative examinations: Secondary | ICD-10-CM | POA: Diagnosis not present

## 2021-12-10 DIAGNOSIS — Z79899 Other long term (current) drug therapy: Secondary | ICD-10-CM | POA: Diagnosis not present

## 2021-12-10 DIAGNOSIS — Z9181 History of falling: Secondary | ICD-10-CM | POA: Diagnosis not present

## 2021-12-10 DIAGNOSIS — M545 Low back pain, unspecified: Secondary | ICD-10-CM | POA: Diagnosis not present

## 2021-12-10 DIAGNOSIS — L700 Acne vulgaris: Secondary | ICD-10-CM | POA: Insufficient documentation

## 2021-12-10 NOTE — Telephone Encounter (Signed)
Patient complains of painless "loud rumbling" in her abdomen. Some discomfort in the RLQ unassociated with the "rumbling."  She reports constipation. She has once weekly stools and frequently has to use laxatives to create the bowel movement. She did not find Colace or natural remedies such as prunes, to be effective. Miralax caused fecal incontinence. Most recently she has used mineral oil for her constipation relief. Patient reports chronic narcotic use. She had a CT AP in June of this year. She reports it showed constipation but no cause for her RLQ pain. She will see her PCP and pain management providers today.

## 2021-12-10 NOTE — Progress Notes (Signed)
Pt presents for lower back pain -pt feels like back pain is radiating from abdominal pain  -has plans to leave tomorrow for [place] w/spouse but wants letter from provider stating that due to medical issues she is unable to travel

## 2021-12-11 NOTE — Telephone Encounter (Signed)
Patient will start with a half dose of Miralax today. She may take 1 dose of Mineral Oil tonight. She does not plan on using Mineral Oil regularly. She will continue with  a half dose Miralax and titrate as she needs. Follow up prn.

## 2021-12-19 ENCOUNTER — Encounter: Payer: Self-pay | Admitting: Family

## 2021-12-19 ENCOUNTER — Other Ambulatory Visit: Payer: Self-pay | Admitting: Family

## 2021-12-19 ENCOUNTER — Telehealth: Payer: Self-pay | Admitting: Family

## 2021-12-19 DIAGNOSIS — B3731 Acute candidiasis of vulva and vagina: Secondary | ICD-10-CM

## 2021-12-19 MED ORDER — TERCONAZOLE 0.8 % VA CREA: 1.0000 | TOPICAL_CREAM | Freq: Every day | VAGINAL | 2 refills | Status: DC

## 2021-12-19 NOTE — Telephone Encounter (Signed)
Dropping off paperwork asking to be completed by PCP regarding CSA Travel Protection & an attached handwritten note for PCP that reads as follows, " Dr. Minette Brine, This is the paper for travel expense for reimbursement for trip 12-11-29-23 Thank you. Call me when paperwork are done ok? With price to complete paperwork.    Courtney [name]"    (left in provider bin). Pt asked for a call when this is completed and with the expense of the completion of form.

## 2021-12-20 NOTE — Telephone Encounter (Signed)
Pt returned the call and scheduled a telephone visit for 12/23/21. Pt stated her husband filled out the same form she dropped off with all of the information for Amy just to sign, and she stated she would be faxing it over just in case PCP would like to use that.  Please advise.

## 2021-12-20 NOTE — Telephone Encounter (Signed)
Called to schedule appt for completion of paperwork recently dropped off. No answer, LVM to call back to schedule. 1st available are telephone appts w/ PCP for Monday 23rd.

## 2021-12-22 NOTE — Progress Notes (Unsigned)
Virtual Visit via Telephone Note  I connected with Courtney [name], on 12/23/2021 at 2:32 PM by telephone and verified that I am speaking with the correct person using two identifiers.  Consent: I discussed the limitations, risks, security and privacy concerns of performing an evaluation and management service by telephone and the availability of in person appointments. I also discussed with the patient that there may be a patient responsible charge related to this service. The patient expressed understanding and agreed to proceed.   Location of Patient: Home  Location of Provider: [facility] [place] participating in Telemedicine visit: Ely Latana Colin Durene Fruits, NP   History of Present Illness: Courtney [name] is a 66 year-old female who presents for completion of CSA Travel Protection and [employer] travel insurance claim paperwork. Requests completion so that she will be reimbursed for trip she was scheduled to attend on 12/11/2021 - 12/31/2021. She is unable to attend the trip due to chronic abdominal pain. She has an appointment 01/22/2022 with Nicoletta Ba, [place] at [facility]. No further issues/concerns.    Past Medical History:  Diagnosis Date   Abnormal ECG 07/18/2015   Arthritis    Asthma    Bowel obstruction ([place])    Depression with anxiety 02/09/2015   mild xanax prn   Diabetes mellitus ([place]) 07/18/2015   type 2   Diverticulitis    Dysrhythmia    abnormal ekgs worked up   Essential tremor 03/31/2016   Fibromyalgia    Gait difficulty 02/09/2015   Gallstones    GERD (gastroesophageal reflux disease)    HLD (hyperlipidemia)    Hypersomnia 11/09/2015   Hypertension    no meds   Hyperthyroidism    patient states has no problems at present-01/25/2019   Hypothyroidism    patient states no problems at present 01/25/2019   IBS (irritable bowel syndrome)    Insomnia 02/09/2015   Migraines    MS  (multiple sclerosis) ([place])    Multiple joint pain 03/31/2016   Numbness in both hands 03/18/2016   Other fatigue 02/09/2015   Peptic ulcer    Pneumonia    PONV (postoperative nausea and vomiting)    Urinary hesitancy 02/09/2015   Vision abnormalities    Allergies  Allergen Reactions   Ibuprofen Other (See Comments)    Developed a ulcer  Other reaction(s): Other (See Comments) Developed a ulcer    Methylprednisolone Anaphylaxis, Shortness Of Breath, Palpitations and Other (See Comments)   Prednisone Anaphylaxis, Shortness Of Breath, Palpitations and Other (See Comments)   Bactrim [Sulfamethoxazole-Trimethoprim] Other (See Comments)    "blotchiness" redness, face swelling   Biaxin [Clarithromycin] Swelling   Naproxen Other (See Comments)    Developed a ulcer    Sulfa Antibiotics Other (See Comments) and Swelling    Other reaction(s): Angioedema Other reaction(s): Angioedema    Current Outpatient Medications on File Prior to Visit  Medication Sig Dispense Refill   acetaminophen (TYLENOL) 500 MG tablet Take 1,000 mg by mouth every 8 (eight) hours as needed for headache.     alprazolam (XANAX) 2 MG tablet Take 2 mg by mouth 3 (three) times daily as needed for anxiety.      citalopram (CELEXA) 20 MG tablet Take by mouth.     cyclobenzaprine (FLEXERIL) 5 MG tablet Take 5 mg by mouth every 4 (four) hours as needed.     Dulaglutide (TRULICITY) 6.64 QI/3.4VQ SOPN Inject 0.75 mg into the skin once  a week. 6 mL 3   empagliflozin (JARDIANCE) 25 MG TABS tablet Take 1 tablet (25 mg total) by mouth daily. 90 tablet 3   esomeprazole (NEXIUM) 40 MG capsule Take 40 mg by mouth 2 (two) times daily.     glucose blood (FREESTYLE LITE) test strip 1 each by Other route in the morning and at bedtime. Use as instructed 200 each 3   insulin aspart protamine - aspart (NOVOLOG 70/30 MIX) (70-30) 100 UNIT/ML FlexPen Inject 20 Units into the skin daily with breakfast AND 22 Units daily with supper. 30 mL 3    Insulin Pen Needle 32G X 4 MM MISC 1 Device by Does not apply route in the morning and at bedtime. 200 each 3   Lancets (FREESTYLE) lancets 1 Device by Misc.(Non-Drug; Combo Route) route daily. USED TO CHECK BLOOD SUGAR TID     loratadine (CLARITIN) 10 MG tablet Take by mouth.     Multiple Vitamin (MULTIVITAMIN) tablet Take 2 tablets by mouth daily. Gummies     naloxone (NARCAN) nasal spray 4 mg/0.1 mL Place 1 spray into the nose daily as needed (overdose).     oxyCODONE (ROXICODONE) 15 MG immediate release tablet Take 15 mg by mouth 5 (five) times daily as needed.     pantoprazole (PROTONIX) 40 MG tablet Take 1 tablet (40 mg total) by mouth daily. 90 tablet 0   terconazole (TERAZOL 3) 0.8 % vaginal cream Place 1 applicator vaginally at bedtime. 60 g 2   [DISCONTINUED] sucralfate (CARAFATE) 1 g tablet Take 1 tablet (1 g total) by mouth 4 (four) times daily -  before meals and at bedtime. Dissolve 1 tablet in 2-4 oz of water, drink as slurry 120 tablet 1   No current facility-administered medications on file prior to visit.    Observations/Objective: Alert and oriented x 3. Not in acute distress. Physical examination not completed as this is a telemedicine visit.  Assessment and Plan: 1. Encounter for completion of form with patient 2. Chronic abdominal pain - CSA Travel Protection and [employer] travel insurance claim completed today in office.  - Keep appointment scheduled 01/22/2022 with Nicoletta Ba, PA at [facility].    Follow Up Instructions: Keep scheduled appointment at Gastroenterology.    Patient was given clear instructions to go to Emergency Department or return to medical center if symptoms don't improve, worsen, or new problems develop.The patient verbalized understanding.  I discussed the assessment and treatment plan with the patient. The patient was provided an opportunity to ask questions and all were answered. The patient agreed with the plan and  demonstrated an understanding of the instructions.   The patient was advised to call back or seek an in-person evaluation if the symptoms worsen or if the condition fails to improve as anticipated.    I provided 10 minutes total of non-face-to-face time during this encounter.   Camillia Herter, NP  [facility] [place], [place] 12/23/2021, 2:32 PM

## 2021-12-23 ENCOUNTER — Ambulatory Visit (INDEPENDENT_AMBULATORY_CARE_PROVIDER_SITE_OTHER): Payer: Medicare Other | Admitting: Family

## 2021-12-23 DIAGNOSIS — G8929 Other chronic pain: Secondary | ICD-10-CM

## 2021-12-23 DIAGNOSIS — R109 Unspecified abdominal pain: Secondary | ICD-10-CM | POA: Diagnosis not present

## 2021-12-23 DIAGNOSIS — Z0289 Encounter for other administrative examinations: Secondary | ICD-10-CM

## 2021-12-30 ENCOUNTER — Telehealth: Payer: Self-pay | Admitting: Plastic Surgery

## 2021-12-30 NOTE — Telephone Encounter (Signed)
Called to r/s with new provider due to [employer] no longer with [place].

## 2022-01-01 ENCOUNTER — Ambulatory Visit (INDEPENDENT_AMBULATORY_CARE_PROVIDER_SITE_OTHER): Payer: Medicare Other | Admitting: Plastic Surgery

## 2022-01-01 ENCOUNTER — Encounter: Payer: Self-pay | Admitting: Plastic Surgery

## 2022-01-01 ENCOUNTER — Ambulatory Visit: Payer: Medicare Other | Admitting: Podiatry

## 2022-01-01 VITALS — BP 138/85 | HR 74 | Ht 62.5 in | Wt 158.0 lb

## 2022-01-01 DIAGNOSIS — L905 Scar conditions and fibrosis of skin: Secondary | ICD-10-CM

## 2022-01-01 NOTE — Addendum Note (Signed)
Addended by: Harl Bowie on: 01/01/2022 05:23 PM   Modules accepted: Orders

## 2022-01-01 NOTE — Progress Notes (Signed)
Referring Provider Camillia Herter, NP [address] [place],  [place] [address]   CC:  Chief Complaint  Patient presents with   Consult      Lyndsy Kimbely Whiteaker is an 66 y.o. female.  HPI: Ms. Storey Stangeland presents today for evaluation of a wound at the inferior aspect of abdominal wall.  She states the wound was at the site of a previous scar however it is healed since she first made the consult.  She was concerned because she was worried about a nonhealing wound due to her diabetes.  Allergies  Allergen Reactions   Ibuprofen Other (See Comments)    Developed a ulcer  Other reaction(s): Other (See Comments) Developed a ulcer    Methylprednisolone Anaphylaxis, Shortness Of Breath, Palpitations and Other (See Comments)   Prednisone Anaphylaxis, Shortness Of Breath, Palpitations and Other (See Comments)   Bactrim [Sulfamethoxazole-Trimethoprim] Other (See Comments)    "blotchiness" redness, face swelling   Biaxin [Clarithromycin] Swelling   Naproxen Other (See Comments)    Developed a ulcer    Sulfa Antibiotics Other (See Comments) and Swelling    Other reaction(s): Angioedema Other reaction(s): Angioedema    Outpatient Encounter Medications as of 01/01/2022  Medication Sig   acetaminophen (TYLENOL) 500 MG tablet Take 1,000 mg by mouth every 8 (eight) hours as needed for headache.   alprazolam (XANAX) 2 MG tablet Take 2 mg by mouth 3 (three) times daily as needed for anxiety.    citalopram (CELEXA) 20 MG tablet Take by mouth.   cyclobenzaprine (FLEXERIL) 5 MG tablet Take 5 mg by mouth every 4 (four) hours as needed.   Dulaglutide (TRULICITY) 7.67 HA/1.9FX SOPN Inject 0.75 mg into the skin once a week.   empagliflozin (JARDIANCE) 25 MG TABS tablet Take 1 tablet (25 mg total) by mouth daily.   esomeprazole (NEXIUM) 40 MG capsule Take 40 mg by mouth 2 (two) times daily.   glucose blood (FREESTYLE LITE) test strip 1 each by Other route in the morning and at bedtime.  Use as instructed   insulin aspart protamine - aspart (NOVOLOG 70/30 MIX) (70-30) 100 UNIT/ML FlexPen Inject 20 Units into the skin daily with breakfast AND 22 Units daily with supper.   Insulin Pen Needle 32G X 4 MM MISC 1 Device by Does not apply route in the morning and at bedtime.   Lancets (FREESTYLE) lancets 1 Device by Misc.(Non-Drug; Combo Route) route daily. USED TO CHECK BLOOD SUGAR TID   loratadine (CLARITIN) 10 MG tablet Take by mouth.   Multiple Vitamin (MULTIVITAMIN) tablet Take 2 tablets by mouth daily. Gummies   naloxone (NARCAN) nasal spray 4 mg/0.1 mL Place 1 spray into the nose daily as needed (overdose).   oxyCODONE (ROXICODONE) 15 MG immediate release tablet Take 15 mg by mouth 5 (five) times daily as needed.   pantoprazole (PROTONIX) 40 MG tablet Take 1 tablet (40 mg total) by mouth daily.   terconazole (TERAZOL 3) 0.8 % vaginal cream Place 1 applicator vaginally at bedtime.   [DISCONTINUED] sucralfate (CARAFATE) 1 g tablet Take 1 tablet (1 g total) by mouth 4 (four) times daily -  before meals and at bedtime. Dissolve 1 tablet in 2-4 oz of water, drink as slurry   No facility-administered encounter medications on file as of 01/01/2022.     Past Medical History:  Diagnosis Date   Abnormal ECG 07/18/2015   Arthritis    Asthma    Bowel obstruction (HCC)    Depression  with anxiety 02/09/2015   mild xanax prn   Diabetes mellitus ([place]) 07/18/2015   type 2   Diverticulitis    Dysrhythmia    abnormal ekgs worked up   Essential tremor 03/31/2016   Fibromyalgia    Gait difficulty 02/09/2015   Gallstones    GERD (gastroesophageal reflux disease)    HLD (hyperlipidemia)    Hypersomnia 11/09/2015   Hypertension    no meds   Hyperthyroidism    patient states has no problems at present-01/25/2019   Hypothyroidism    patient states no problems at present 01/25/2019   IBS (irritable bowel syndrome)    Insomnia 02/09/2015   Migraines    MS (multiple sclerosis) ([place])     Multiple joint pain 03/31/2016   Numbness in both hands 03/18/2016   Other fatigue 02/09/2015   Peptic ulcer    Pneumonia    PONV (postoperative nausea and vomiting)    Urinary hesitancy 02/09/2015   Vision abnormalities     Past Surgical History:  Procedure Laterality Date   ABDOMINAL HYSTERECTOMY  1976   including oophorectomy, due to uterine cancer   [place]  01/31/2019   Procedure: BIOPSY;  Surgeon: Mauri Pole, MD;  Location: [facility];  Service: Endoscopy;;   CARPAL TUNNEL RELEASE Left    carpal tunnel surgery   CHOLECYSTECTOMY     2006   COLON SURGERY     SBO x 2   COLONOSCOPY WITH PROPOFOL N/A 01/31/2019   Procedure: COLONOSCOPY WITH PROPOFOL;  Surgeon: Mauri Pole, MD;  Location: [facility];  Service: Endoscopy;  Laterality: N/A;   EUS N/A 04/09/2017   Procedure: UPPER ENDOSCOPIC ULTRASOUND (EUS) LINEAR;  Surgeon: Milus Banister, MD;  Location: [facility];  Service: Endoscopy;  Laterality: N/A;   HERNIA REPAIR     MENISCUS REPAIR Left    POLYPECTOMY  01/31/2019   Procedure: POLYPECTOMY;  Surgeon: Mauri Pole, MD;  Location: [facility];  Service: Endoscopy;;   SMALL INTESTINE SURGERY     toenail removal Right 12/2017   great toenail     Family History  Problem Relation Age of Onset   Heart disease Mother    Kidney disease Mother    Diabetes Mother    Other Father 42       murdered   Multiple sclerosis Daughter    Multiple sclerosis Other    Diabetes Sister    Alcohol abuse Brother        ETOH and marijuana   Diabetes Brother    Diabetes Sister    Diabetes Brother    ALS Brother     Social History   Social History Narrative   Pt is right-handed. She lives with her husband in a 2 story house. Pt avoids caffeine. She and her husband go to the gym, lately once weekly, previously 3 times a week.      Review of Systems General: Denies fevers, chills, weight loss CV: Denies chest pain, shortness of breath,  palpitations Skin: She has a well-healed scar at the site of the previous open wound.  Physical Exam    01/01/2022    3:32 PM 12/10/2021    1:53 PM 12/05/2021   10:05 AM  Vitals with BMI  Height 5' 2.5" 5' 2.992" '5\' 3"'$   Weight 158 lbs 158 lbs 158 lbs 10 oz  BMI 36.46 28 80.3  Systolic 212 248 250  Diastolic 85 83 88  Pulse 74 74 75  General:  No acute distress,  Alert and oriented, Non-Toxic, Normal speech and affect Integument: There is no open wound at the moment. Mammogram: Not applicable Assessment/Plan Scar: The patient has a well-healed scar at the site of the previous opening.  She is reassured that a wound in this area is much different than a pressure ulcer which she was also concerned about.  I have offered her the option of returning if she has another opening or wound that she is concerned about.  She may make an appointment and I will see her while the wound is still open and we will make a decision about what if anything should be done.  Camillia Herter 01/01/2022, 4:24 PM

## 2022-01-07 ENCOUNTER — Ambulatory Visit (INDEPENDENT_AMBULATORY_CARE_PROVIDER_SITE_OTHER): Payer: Medicare Other | Admitting: Podiatry

## 2022-01-07 DIAGNOSIS — E119 Type 2 diabetes mellitus without complications: Secondary | ICD-10-CM | POA: Diagnosis not present

## 2022-01-10 NOTE — Progress Notes (Signed)
  Subjective:  Patient ID: Courtney [name], female    DOB: 1955/05/29,  MRN: [id number]  Chief Complaint  Patient presents with   Nail Problem   Diabetes   Foot Pain    66 y.o. female presents with the above complaint. History confirmed with patient.  Her diabetes she says is well controlled.  She is able to take care of her own nails.  She thought she had an ingrown nail and wanted it checked because she was worried about it.  Has had a bunch of shoes that are uncomfortable and causing pressure  Objective:  Physical Exam: warm, good capillary refill, no trophic changes or ulcerative lesions, normal DP and PT pulses, normal sensory exam, and no notable ingrowing nail  Assessment:   1. Encounter for comprehensive diabetic foot examination, type 2 diabetes mellitus ([place])      Plan:  Patient was evaluated and treated and all questions answered.   Patient educated on diabetes. Discussed proper diabetic foot care and discussed risks and complications of disease. Educated patient in depth on reasons to return to the office immediately should he/she discover anything concerning or new on the feet. All questions answered. Discussed proper shoes as well.    Return if symptoms worsen or fail to improve.

## 2022-01-15 ENCOUNTER — Institutional Professional Consult (permissible substitution): Payer: Medicare Other | Admitting: Plastic Surgery

## 2022-01-16 DIAGNOSIS — F411 Generalized anxiety disorder: Secondary | ICD-10-CM | POA: Diagnosis not present

## 2022-01-16 DIAGNOSIS — Z79899 Other long term (current) drug therapy: Secondary | ICD-10-CM | POA: Diagnosis not present

## 2022-01-20 DIAGNOSIS — T402X5A Adverse effect of other opioids, initial encounter: Secondary | ICD-10-CM | POA: Diagnosis not present

## 2022-01-20 DIAGNOSIS — Z79899 Other long term (current) drug therapy: Secondary | ICD-10-CM | POA: Diagnosis not present

## 2022-01-20 DIAGNOSIS — G47429 Narcolepsy in conditions classified elsewhere without cataplexy: Secondary | ICD-10-CM | POA: Diagnosis not present

## 2022-01-20 DIAGNOSIS — K5903 Drug induced constipation: Secondary | ICD-10-CM | POA: Diagnosis not present

## 2022-01-20 DIAGNOSIS — F3342 Major depressive disorder, recurrent, in full remission: Secondary | ICD-10-CM | POA: Diagnosis not present

## 2022-01-20 DIAGNOSIS — E1140 Type 2 diabetes mellitus with diabetic neuropathy, unspecified: Secondary | ICD-10-CM | POA: Diagnosis not present

## 2022-01-20 DIAGNOSIS — M545 Low back pain, unspecified: Secondary | ICD-10-CM | POA: Diagnosis not present

## 2022-01-20 DIAGNOSIS — E663 Overweight: Secondary | ICD-10-CM | POA: Diagnosis not present

## 2022-01-20 DIAGNOSIS — Z9181 History of falling: Secondary | ICD-10-CM | POA: Diagnosis not present

## 2022-01-22 ENCOUNTER — Encounter: Payer: Self-pay | Admitting: Physician Assistant

## 2022-01-22 ENCOUNTER — Ambulatory Visit (INDEPENDENT_AMBULATORY_CARE_PROVIDER_SITE_OTHER): Payer: Medicare Other | Admitting: Physician Assistant

## 2022-01-22 VITALS — BP 110/68 | HR 81 | Ht 63.0 in | Wt 162.0 lb

## 2022-01-22 DIAGNOSIS — Z86010 Personal history of colonic polyps: Secondary | ICD-10-CM | POA: Diagnosis not present

## 2022-01-22 DIAGNOSIS — K219 Gastro-esophageal reflux disease without esophagitis: Secondary | ICD-10-CM

## 2022-01-22 DIAGNOSIS — K5909 Other constipation: Secondary | ICD-10-CM

## 2022-01-22 DIAGNOSIS — Z79899 Other long term (current) drug therapy: Secondary | ICD-10-CM | POA: Diagnosis not present

## 2022-01-22 MED ORDER — ONDANSETRON HCL 4 MG PO TABS: 4.0000 mg | ORAL_TABLET | Freq: Four times a day (QID) | ORAL | 0 refills | Status: DC | PRN

## 2022-01-22 MED ORDER — PANTOPRAZOLE SODIUM 40 MG PO TBEC: 40.0000 mg | DELAYED_RELEASE_TABLET | Freq: Every day | ORAL | 5 refills | Status: DC

## 2022-01-22 MED ORDER — PLENVU 140 G PO SOLR: 1.0000 | ORAL | 0 refills | Status: DC

## 2022-01-22 NOTE — Patient Instructions (Addendum)
If you are age 67 or older, your body mass index should be between 23-30. Your Body mass index is 28.7 kg/m. If this is out of the aforementioned range listed, please consider follow up with your Primary Care Provider.  If you are age 73 or younger, your body mass index should be between 19-25. Your Body mass index is 28.7 kg/m. If this is out of the aformentioned range listed, please consider follow up with your Primary Care Provider.   ________________________________________________________  The [facility] providers would like to encourage you to use [facility] to communicate with providers for non-urgent requests or questions.  Due to long hold times on the telephone, sending your provider a message by [facility] may be a faster and more efficient way to get a response.  Please allow 48 business hours for a response.  Please remember that this is for non-urgent requests.   You have been scheduled for a colonoscopy. Please follow written instructions given to you at your visit today.  Please pick up your prep supplies at the pharmacy within the next 1-3 days. If you use inhalers (even only as needed), please bring them with you on the day of your procedure.   Due to recent changes in healthcare laws, you may see the results of your imaging and laboratory studies on MyChart before your provider has had a chance to review them.  We understand that in some cases there may be results that are confusing or concerning to you. Not all laboratory results come back in the same time frame and the provider may be waiting for multiple results in order to interpret others.  Please give [place] 48 hours in order for your provider to thoroughly review all the results before contacting the office for clarification of your results.    We have sent the following medications to your pharmacy for you to pick up at your convenience:  Start Zofran 4 mg every 6 hours as needed 30 minutes prior to colonoscopy prep Continue  Phazyme  [facility] Pro[facility]ix [facility]   Thank you for entrusting me with your care and choosing [facility].  Amy Esterwood PA-C .

## 2022-01-22 NOTE — Progress Notes (Signed)
Subjective:    Patient ID: Courtney [name], female    DOB: Apr 25, 1955, 66 y.o.   MRN: [id number]  HPI Courtney [name] is a 66 year old African-American female established with Dr. Silverio Decamp, who comes in today for follow-up.  She has history of diverticulosis, prior episodes of diverticulitis, recurrent abdominal pain, she is status post several abdominal surgeries including cholecystectomy and appendectomy and had two surgeries for small bowel obstruction several years ago 2008.  She also has history of coronary artery disease, LPR, adult onset diabetes mellitus, MS, chronic kidney disease and fibromyalgia. She had called here in October with constipation symptoms and was advised to do a bowel purge.  She says she tried several different over-the-counter laxatives including mineral oil and finally had good results with Dulcolax.  Over the past month or so she has been doing pretty well not having any significant constipation, usually having a bowel movement at least every other day.  She has been tried on multiple different agents in the past including Linzess, Amitiza, and Movantik none of which worked well for her constipation, she is opioid dependent. Last colonoscopy was done in November 2020 with removal of 5 polyps, the largest was 12 mm and noted to have scattered diverticulosis in the sigmoid and left colon.  Path showed the largest polyp to be a tubular adenoma and one of the others was hyperplastic and she was indicated to have 3-year interval follow-up She did have CT of the abdomen pelvis in June 2023 which showed a small supraumbilical hernia She says she had seen a surgeon this past summer after being considered for a possible panniculectomy, she was told she may need repair of the small ventral hernia however this has not been symptomatic. He had previously been on Nexium twice daily per ENT for LPR, says this did not work as well as Chart[employer] certified [employer] and put herself back on Protonix once daily and says  this has been working well.  Review of Systems Pertinent positive and negative review of systems were noted in the above HPI section.  All other review of systems was otherwise negative.  Outpatient Encounter Medications as of 01/22/2022  Medication Sig   acetaminophen (TYLENOL) 500 MG tablet Take 1,000 mg by mouth every 8 (eight) hours as needed for headache.   alprazolam (XANAX) 2 MG tablet Take 2 mg by mouth 3 (three) times daily as needed for anxiety.    empagliflozin (JARDIANCE) 25 MG TABS tablet Take 1 tablet (25 mg total) by mouth daily.   glucose blood (FREESTYLE LITE) test strip 1 each by Other route in the morning and at bedtime. Use as instructed   insulin aspart protamine - aspart (NOVOLOG 70/30 MIX) (70-30) 100 UNIT/ML FlexPen Inject 20 Units into the skin daily with breakfast AND 22 Units daily with supper.   Insulin Pen Needle 32G X 4 MM MISC 1 Device by Does not apply route in the morning and at bedtime.   Lancets (FREESTYLE) lancets 1 Device by Misc.(Non-Drug; Combo Route) route daily. USED TO CHECK BLOOD SUGAR TID   loratadine (CLARITIN) 10 MG tablet Take by mouth.   Multiple Vitamin (MULTIVITAMIN) tablet Take 2 tablets by mouth daily. Gummies   naloxone (NARCAN) nasal spray 4 mg/0.1 mL Place 1 spray into the nose daily as needed (overdose).   ondansetron (ZOFRAN) 4 MG tablet Take 1 tablet (4 mg total) by mouth every 6 (six) hours as needed for nausea or vomiting. Prior to colonoscopy prep   oxyCODONE (ROXICODONE) 15 MG  immediate release tablet Take 15 mg by mouth 5 (five) times daily as needed.   PEG-KCl-NaCl-NaSulf-Na Asc-C (PLENVU) 140 g SOLR Take 1 kit by mouth as directed. Use coupon: BIN: 478295 PNC: CNRX Group: [id number] ID: [id number]   terconazole (TERAZOL 3) 0.8 % vaginal cream Place 1 applicator vaginally at bedtime.   [DISCONTINUED] pantoprazole (PROTONIX) 40 MG tablet Take 1 tablet (40 mg total) by mouth daily.   pantoprazole (PROTONIX) 40 MG tablet Take 1 tablet  (40 mg total) by mouth daily. Take before breakfast   [DISCONTINUED] esomeprazole (NEXIUM) 40 MG capsule Take 40 mg by mouth 2 (two) times daily. (Patient not taking: Reported on 01/22/2022)   [DISCONTINUED] sucralfate (CARAFATE) 1 g tablet Take 1 tablet (1 g total) by mouth 4 (four) times daily -  before meals and at bedtime. Dissolve 1 tablet in 2-4 oz of water, drink as slurry   No facility-administered encounter medications on file as of 01/22/2022.   Allergies  Allergen Reactions   Ibuprofen Other (See Comments)    Developed a ulcer  Other reaction(s): Other (See Comments) Developed a ulcer    Methylprednisolone Anaphylaxis, Shortness Of Breath, Palpitations and Other (See Comments)   Prednisone Anaphylaxis, Shortness Of Breath, Palpitations and Other (See Comments)   Bactrim [Sulfamethoxazole-Trimethoprim] Other (See Comments)    "blotchiness" redness, face swelling   Biaxin [Clarithromycin] Swelling   Naproxen Other (See Comments)    Developed a ulcer    Sulfa Antibiotics Other (See Comments) and Swelling    Other reaction(s): Angioedema Other reaction(s): Angioedema   Patient Active Problem List   Diagnosis Date Noted   Open comedone 32/44/0102   Supraumbilical hernia 72/53/6644   Comedone 05/03/2021   Dermatofibroma 05/03/2021   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   [place] 05/03/2021   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   Sebaceous cyst 05/03/2021   Seborrheic dermatitis of scalp 05/03/2021   Biceps tendonitis on left 04/30/2021   Stage 3a chronic kidney disease ([place]) 10/21/2020   Type 2 diabetes mellitus with stage 3a chronic kidney disease, with long-term current use of insulin ([place]) 10/21/2020   Type 2 diabetes mellitus with diabetic polyneuropathy, with long-term current use of insulin ([place]) 06/20/2020   Dyslipidemia, goal LDL below 70 04/10/2020   Episode of recurrent major depressive disorder ([place]) 04/10/2020   Pain in left shoulder 04/04/2020   Vaginal dryness,  menopausal 07/07/2019   Tremor 05/30/2019   Memory loss 05/03/2019   History of colonic polyps 03/16/2019   Fracture of distal phalanx of right ring finger 03/15/2019   Facet arthritis, degenerative, 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   Polyp of transverse colon    Special screening for malignant neoplasms, colon    Polyp of sigmoid colon    Anxiety 09/09/2018   GERD (gastroesophageal reflux disease) 09/09/2018   Cervicalgia 08/10/2018   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   Laryngopharyngeal reflux (LPR) 05/14/2018   Throat pain 05/14/2018   Loss of weight    RLQ abdominal pain 03/27/2017   Bloating 03/27/2017   Abnormal finding on imaging 03/27/2017   Dilation of biliary tract 03/27/2017   Nausea 03/27/2017   Chronic sinusitis 03/19/2017   Multiple joint pain 03/31/2016   Essential tremor 03/31/2016   Numbness in both hands 03/18/2016   Hypersomnia 11/09/2015   Diabetes mellitus ([place]) 07/18/2015   Abnormal ECG 07/18/2015   Gait difficulty 02/09/2015   Chronic left shoulder pain 02/09/2015   Generalized anxiety disorder 02/09/2015   Numbness 02/09/2015   Insomnia 02/09/2015   Urinary hesitancy  02/09/2015   Other fatigue 02/09/2015   Fibromyalgia 02/09/2015   Essential hypertension 09/01/2011   Coronary artery disease involving native coronary artery of native heart without angina pectoris 09/01/2011   MS (multiple sclerosis) ([place]) 09/01/2011   Parkinson's disease 09/01/2011   Type 2 diabetes mellitus without complications ([place]) 42/68/3419   Chronic pain 09/01/2011   Social History   Socioeconomic History   Marital status: Married    Spouse name: Jimmie   Number of children: 4   Years of education: Not on file   Highest education level: 12th grade  Occupational History   Occupation: disabled  Tobacco Use   Smoking status: Former    Packs/day: 0.25    Years: 5.00    Total pack years: 1.25    Types: Cigarettes    Quit date: 1990    Years since quitting: 33.9     Passive exposure: Never   Smokeless tobacco: Never  Vaping Use   Vaping Use: Never used  Substance and Sexual Activity   Alcohol use: Yes    Comment: rarely   Drug use: No   Sexual activity: Yes  Other Topics Concern   Not on file  Social History Narrative   Pt is right-handed. She lives with her husband in a 2 story house. Pt avoids caffeine. She and her husband go to the gym, lately once weekly, previously 3 times a week.    Social Determinants of Health   Financial Resource Strain: Not on file  Food Insecurity: Not on file  Transportation Needs: Not on file  Physical Activity: Not on file  Stress: Not on file  Social Connections: Not on file  Intimate Partner Violence: Not on file    Ms. Trini Soldo family history includes ALS in her brother; Alcohol abuse in her brother; Diabetes in her brother, brother, mother, sister, and sister; Heart disease in her mother; Kidney disease in her mother; Multiple sclerosis in her daughter and another family member; Other (age of onset: 25) in her father.      Objective:    Vitals:   01/22/22 1106  BP: 110/68  Pulse: 81  SpO2: 98%    Physical Exam Well-developed well-nourished older African-American female in no acute distress.  Height, Weight 162, BMI 28.7  HEENT; nontraumatic normocephalic, EOMI, PE R LA, sclera anicteric. Oropharynx; not examined today Neck; supple, no JVD Cardiovascular; regular rate and rhythm with S1-S2, no murmur rub or gallop Pulmonary; Clear bilaterally Abdomen; soft, nontender, nondistended, no palpable mass or hepatosplenomegaly, bowel sounds are active.  Rule abdominal incisional scars small supraumbilical ventral hernia Rectal; done today Skin; benign exam, no jaundice rash or appreciable lesions Extremities; no clubbing cyanosis or edema skin warm and dry Neuro/Psych; alert and oriented x4, grossly nonfocal mood and affect appropriate        Assessment & Plan:   #2 66 year old  African-American female with history of adenomatous colon polyps, last colonoscopy November 2020 with 3 polyps removed, one was a 12 mm tubular adenoma and indicated for 3-year interval follow-up  #2 history of diverticulosis and prior diverticulitis #3 constipation, currently managing well with mineral oil and Dulcolax as needed-failed several prescription drugs in the past-likely secondary to chronic opioids #4 opioid dependence #5 multiple abdominal surgeries, including appendectomy, cholecystectomy and hernia repair with mesh as well as 2 previous surgeries for small bowel obstruction. #6 LPR-currently doing well on Protonix 40 mg once daily 7.  MS 8.  Fibromyalgia 9.  Chronic kidney disease stage III 10.  Adult onset diabetes mellitus 11.  Coronary artery disease 12.  Hypertension Plan; patient will be scheduled for colonoscopy with Dr. Silverio Decamp.  Procedure was discussed in detail with the patient including indication risks and benefits.  She underwent 2-day prep prior to last colonoscopy and we will plan for 2-day prep currently as well Continue Protonix 40 mg p.o. every morning, prescription sent, refill x1 year Continue Phazyme as needed Continue Dulcolax and/or mineral oil every 2 to 3 days if no bowel movement   Annalysse Shoemaker S Giavanna Kang PA-C 01/22/2022   Cc: Camillia Herter, NP

## 2022-01-25 ENCOUNTER — Other Ambulatory Visit: Payer: Self-pay | Admitting: Internal Medicine

## 2022-01-29 ENCOUNTER — Other Ambulatory Visit: Payer: Self-pay | Admitting: *Deleted

## 2022-01-29 MED ORDER — PANTOPRAZOLE SODIUM 40 MG PO TBEC: 40.0000 mg | DELAYED_RELEASE_TABLET | Freq: Every day | ORAL | 3 refills | Status: DC

## 2022-02-05 ENCOUNTER — Other Ambulatory Visit: Payer: Self-pay | Admitting: Internal Medicine

## 2022-02-06 ENCOUNTER — Encounter: Payer: Self-pay | Admitting: Family

## 2022-02-06 DIAGNOSIS — H40053 Ocular hypertension, bilateral: Secondary | ICD-10-CM | POA: Diagnosis not present

## 2022-02-06 DIAGNOSIS — H5010 Unspecified exotropia: Secondary | ICD-10-CM | POA: Diagnosis not present

## 2022-02-06 DIAGNOSIS — E119 Type 2 diabetes mellitus without complications: Secondary | ICD-10-CM | POA: Diagnosis not present

## 2022-02-06 DIAGNOSIS — H2513 Age-related nuclear cataract, bilateral: Secondary | ICD-10-CM | POA: Diagnosis not present

## 2022-02-06 DIAGNOSIS — H04123 Dry eye syndrome of bilateral lacrimal glands: Secondary | ICD-10-CM | POA: Diagnosis not present

## 2022-02-19 ENCOUNTER — Encounter: Payer: Self-pay | Admitting: Internal Medicine

## 2022-02-19 DIAGNOSIS — G47429 Narcolepsy in conditions classified elsewhere without cataplexy: Secondary | ICD-10-CM | POA: Diagnosis not present

## 2022-02-19 DIAGNOSIS — T402X5A Adverse effect of other opioids, initial encounter: Secondary | ICD-10-CM | POA: Diagnosis not present

## 2022-02-19 DIAGNOSIS — E669 Obesity, unspecified: Secondary | ICD-10-CM | POA: Diagnosis not present

## 2022-02-19 DIAGNOSIS — Z79899 Other long term (current) drug therapy: Secondary | ICD-10-CM | POA: Diagnosis not present

## 2022-02-19 DIAGNOSIS — M545 Low back pain, unspecified: Secondary | ICD-10-CM | POA: Diagnosis not present

## 2022-02-19 DIAGNOSIS — Z9181 History of falling: Secondary | ICD-10-CM | POA: Diagnosis not present

## 2022-02-19 DIAGNOSIS — F3342 Major depressive disorder, recurrent, in full remission: Secondary | ICD-10-CM | POA: Diagnosis not present

## 2022-02-19 DIAGNOSIS — K5903 Drug induced constipation: Secondary | ICD-10-CM | POA: Diagnosis not present

## 2022-02-19 DIAGNOSIS — E1140 Type 2 diabetes mellitus with diabetic neuropathy, unspecified: Secondary | ICD-10-CM | POA: Diagnosis not present

## 2022-02-20 ENCOUNTER — Other Ambulatory Visit: Payer: Self-pay | Admitting: Internal Medicine

## 2022-02-20 MED ORDER — RYBELSUS 3 MG PO TABS: 3.0000 mg | ORAL_TABLET | Freq: Every day | ORAL | 2 refills | Status: DC

## 2022-02-25 DIAGNOSIS — Z79899 Other long term (current) drug therapy: Secondary | ICD-10-CM | POA: Diagnosis not present

## 2022-02-26 ENCOUNTER — Encounter: Payer: Self-pay | Admitting: Obstetrics and Gynecology

## 2022-02-26 ENCOUNTER — Ambulatory Visit (INDEPENDENT_AMBULATORY_CARE_PROVIDER_SITE_OTHER): Payer: Medicare Other | Admitting: Obstetrics and Gynecology

## 2022-02-26 VITALS — BP 139/87 | HR 73 | Ht 62.5 in | Wt 157.2 lb

## 2022-02-26 DIAGNOSIS — Z01419 Encounter for gynecological examination (general) (routine) without abnormal findings: Secondary | ICD-10-CM | POA: Diagnosis not present

## 2022-02-26 DIAGNOSIS — Z7689 Persons encountering health services in other specified circumstances: Secondary | ICD-10-CM | POA: Diagnosis not present

## 2022-02-26 DIAGNOSIS — Z90710 Acquired absence of both cervix and uterus: Secondary | ICD-10-CM | POA: Diagnosis not present

## 2022-02-26 NOTE — Progress Notes (Signed)
   NEW GYNECOLOGY VISIT  Subjective:  Courtney [name] is a 66 y.o. s/p hysterectomy & bilateral salpingo-oophorectomy (1970s) presenting to establish care.   She has no complaints today. Reports that she had her uterus and ovaries removed for a cancer on the uterus. She has had no issues since. Was concerned about a possible cyst that was reported to be on her ovary. It was first visualized in 2014 (not in our system) and again in 2019 on pelvic US on 4/11 - 1.6 x 1.6 x 1.6cm nearly anechoic R adnexal mass without internal blood flow consistent with a cyst. She had a repeat pelvic US on 01/25/20 that did not visualize any adnexal cysts or masses and a CT A/P on 08/14/21 with no adnexal masses.   She has chronic RLQ pain.   She gets frequent yeast infections. She has T2DM and is taking jardiance. She reports that she has diflucan prn and uses topical azoles prn. Has no questions/concerns.   She reports she is UTD on her mammogram and has a colonoscopy scheduled for 04/24/22.  She is not sexually active.  She is not sure why she was referred here today.  I reviewed her past medical, surgical, social, obstetric, and medication histories.   Objective:   Vitals:   02/26/22 1412 02/26/22 1440  BP: (!) 157/84 139/87  Pulse: 93 73  Weight: 157 lb 3.2 oz (71.3 kg)   Height: 5' 2.5" (1.588 m)     General:  Alert, oriented and cooperative. Patient is in no acute distress.  Skin: Skin is warm and dry. No rash noted.   Cardiovascular: Normal heart rate noted  Respiratory: Normal respiratory effort, no problems with respiration noted   Assessment and Plan:  Courtney [name] is a 66 y.o. s/p hysterectomy/BSO presenting to establish gynecologic care.   We discussed her chronic pain, history of adnexal cyst and recurrent yeast infections.  She does not feel like these issues need to be addressed by a gynecologist. Offered pelvic exam to assess for adnexal masses or pelvic floor muscle  spasm that could be contributing to her pain. Discussed low risk but also low yield exam. She would like to defer a pelvic exam at this time.   She does not need pap smears.   She is aware of her options for recurrent yeast infections and will continue her current regimen.   She is now an established patient and can return if any questions/concerns arise.  Return as needed.  Future Appointments  Date Time Provider [place]  06/06/2022  2:40 PM Shamleffer, Melanie Crazier, MD [id number] None   Inez Catalina, MD

## 2022-02-26 NOTE — Progress Notes (Signed)
Patient presents as a new patient gyn.

## 2022-02-27 ENCOUNTER — Encounter: Payer: Self-pay | Admitting: Internal Medicine

## 2022-02-27 ENCOUNTER — Encounter: Payer: Self-pay | Admitting: Obstetrics and Gynecology

## 2022-03-05 DIAGNOSIS — F411 Generalized anxiety disorder: Secondary | ICD-10-CM | POA: Diagnosis not present

## 2022-03-05 DIAGNOSIS — Z79899 Other long term (current) drug therapy: Secondary | ICD-10-CM | POA: Diagnosis not present

## 2022-03-10 ENCOUNTER — Ambulatory Visit: Payer: Self-pay

## 2022-03-10 NOTE — Progress Notes (Deleted)
Patient ID: Courtney [name], female    DOB: 30-Dec-1955  MRN: [id number]  CC: No chief complaint on file.   Subjective: Courtney [name] is a 67 y.o. 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   Sebaceous cyst 05/03/2021   Seborrheic dermatitis of scalp 05/03/2021   Biceps tendonitis on left 04/30/2021   Stage 3a chronic kidney disease ([place]) 10/21/2020   Type 2 diabetes mellitus with stage 3a chronic kidney disease, with long-term current use of insulin ([place]) 10/21/2020   Type 2 diabetes mellitus with diabetic polyneuropathy, with long-term current use of insulin ([place]) 06/20/2020   Dyslipidemia, goal LDL below 70 04/10/2020   Episode of recurrent major depressive disorder ([place]) 04/10/2020   Pain in left shoulder 04/04/2020   Vaginal dryness, menopausal 07/07/2019   Tremor 05/30/2019   Memory loss 05/03/2019   History of colonic polyps 03/16/2019   Fracture of distal phalanx of right ring finger 03/15/2019   Facet arthritis, degenerative, 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   Polyp of transverse colon    Special screening for malignant neoplasms, colon    Polyp of sigmoid colon    Anxiety 09/09/2018   GERD (gastroesophageal reflux disease) 09/09/2018   Cervicalgia 08/10/2018   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   Laryngopharyngeal reflux (LPR) 05/14/2018   Throat pain 05/14/2018   Loss of weight    RLQ abdominal pain 03/27/2017   Bloating 03/27/2017   Abnormal finding on imaging 03/27/2017   Dilation of biliary tract 03/27/2017   Nausea 03/27/2017   Chronic sinusitis 03/19/2017   Multiple joint pain  03/31/2016   Essential tremor 03/31/2016   Numbness in both hands 03/18/2016   Hypersomnia 11/09/2015   Diabetes mellitus ([place]) 07/18/2015   Abnormal ECG 07/18/2015   Gait difficulty 02/09/2015   Chronic left shoulder pain 02/09/2015   Generalized anxiety disorder 02/09/2015   Numbness 02/09/2015   Insomnia 02/09/2015   Urinary hesitancy 02/09/2015   Other fatigue 02/09/2015   Fibromyalgia 02/09/2015   Essential hypertension 09/01/2011   Coronary artery disease involving native coronary artery of native heart without angina pectoris 09/01/2011   MS (multiple sclerosis) ([place]) 09/01/2011   Parkinson's disease 09/01/2011   Type 2 diabetes mellitus without complications ([address]) 0000000   Chronic pain 09/01/2011     Current Outpatient Medications on File Prior to Visit  Medication Sig Dispense Refill   acetaminophen (TYLENOL) 500 MG tablet Take 1,000 mg by mouth every 8 (eight) hours as needed for headache.     alprazolam (XANAX) 2 MG tablet Take 2 mg by mouth 3 (three) times daily as needed for anxiety.      cyclobenzaprine (FLEXERIL) 10 MG tablet Take 10 mg by mouth 3 (three) times daily as needed.     FREESTYLE LITE test strip USE 1 STRIP IN THE MORNING AND AT BEDTIME AS INSTRUCTED 200 strip 3   insulin aspart protamine - aspart (NOVOLOG 70/30 MIX) (70-30) 100 UNIT/ML FlexPen Inject 20 Units into the skin daily with breakfast AND 22 Units daily with supper. 30 mL 3   Insulin Pen Needle 32G X 4 MM  MISC 1 Device by Does not apply route in the morning and at bedtime. 200 each 3   JARDIANCE 25 MG TABS tablet TAKE 1 TABLET DAILY 90 tablet 3   Lancets (FREESTYLE) lancets 1 Device by Misc.(Non-Drug; Combo Route) route daily. USED TO CHECK BLOOD SUGAR TID     latanoprost (XALATAN) 0.005 % ophthalmic solution SMARTSIG:1 Drop(s) In Eye(s) Every Evening     loratadine (CLARITIN) 10 MG tablet Take by mouth.     modafinil (PROVIGIL) 100 MG tablet Take 100 mg by mouth.     Multiple Vitamin  (MULTIVITAMIN) tablet Take 2 tablets by mouth daily. Gummies     naloxone (NARCAN) nasal spray 4 mg/0.1 mL Place 1 spray into the nose daily as needed (overdose).     ondansetron (ZOFRAN) 4 MG tablet Take 1 tablet (4 mg total) by mouth every 6 (six) hours as needed for nausea or vomiting. Prior to colonoscopy prep 10 tablet 0   oxyCODONE (ROXICODONE) 15 MG immediate release tablet Take 15 mg by mouth 5 (five) times daily as needed.     pantoprazole (PROTONIX) 40 MG tablet Take 1 tablet (40 mg total) by mouth daily. 90 tablet 3   terconazole (TERAZOL 3) 0.8 % vaginal cream Place 1 applicator vaginally at bedtime. 60 g 2   [DISCONTINUED] sucralfate (CARAFATE) 1 g tablet Take 1 tablet (1 g total) by mouth 4 (four) times daily -  before meals and at bedtime. Dissolve 1 tablet in 2-4 oz of water, drink as slurry 120 tablet 1   No current facility-administered medications on file prior to visit.    Allergies  Allergen Reactions   Ibuprofen Other (See Comments)    Developed a ulcer  Other reaction(s): Other (See Comments) Developed a ulcer    Methylprednisolone Anaphylaxis, Shortness Of Breath, Palpitations and Other (See Comments)   Prednisone Anaphylaxis, Shortness Of Breath, Palpitations and Other (See Comments)   Bactrim [Sulfamethoxazole-Trimethoprim] Other (See Comments)    "blotchiness" redness, face swelling   Biaxin [Clarithromycin] Swelling   Naproxen Other (See Comments)    Developed a ulcer    Sulfa Antibiotics Other (See Comments) and Swelling    Other reaction(s): Angioedema Other reaction(s): Angioedema    Social History   Socioeconomic History   Marital status: Married    Spouse name: Jimmie   Number of children: 4   Years of education: Not on file   Highest education level: 12th grade  Occupational History   Occupation: disabled  Tobacco Use   Smoking status: Former    Packs/day: 0.25    Years: 5.00    Total pack years: 1.25    Types: Cigarettes    Quit date:  1990    Years since quitting: 34.0    Passive exposure: Never   Smokeless tobacco: Never  Vaping Use   Vaping Use: Never used  Substance and Sexual Activity   Alcohol use: Yes    Comment: rarely   Drug use: No   Sexual activity: Yes  Other Topics Concern   Not on file  Social History Narrative   Pt is right-handed. She lives with her husband in a 2 story house. Pt avoids caffeine. She and her husband go to the gym, lately once weekly, previously 3 times a week.    Social Determinants of Health   Financial Resource Strain: Not on file  Food Insecurity: Not on file  Transportation Needs: Not on file  Physical Activity: Not on file  Stress: Not on file  Social Connections: Not on file  Intimate Partner Violence: Not on file    Family History  Problem Relation Age of Onset   Heart disease Mother    Kidney disease Mother    Diabetes Mother    Other Father 88       murdered   Multiple sclerosis Daughter    Multiple sclerosis Other    Diabetes Sister    Alcohol abuse Brother        ETOH and marijuana   Diabetes Brother    Diabetes Sister    Diabetes Brother    ALS Brother     Past Surgical History:  Procedure Laterality Date   ABDOMINAL HYSTERECTOMY  1976   including oophorectomy, due to uterine cancer   APPENDECTOMY  1974   BIOPSY  01/31/2019   Procedure: BIOPSY;  Surgeon: Mauri Pole, MD;  Location: [facility];  Service: Endoscopy;;   CARPAL TUNNEL RELEASE Left    carpal tunnel surgery   CHOLECYSTECTOMY     2006   COLON SURGERY     SBO x 2   COLONOSCOPY WITH PROPOFOL N/A 01/31/2019   Procedure: COLONOSCOPY WITH PROPOFOL;  Surgeon: Mauri Pole, MD;  Location: [facility];  Service: Endoscopy;  Laterality: N/A;   EUS N/A 04/09/2017   Procedure: UPPER ENDOSCOPIC ULTRASOUND (EUS) LINEAR;  Surgeon: Milus Banister, MD;  Location: [facility];  Service: Endoscopy;  Laterality: N/A;   HERNIA REPAIR     MENISCUS REPAIR Left    POLYPECTOMY   01/31/2019   Procedure: POLYPECTOMY;  Surgeon: Mauri Pole, MD;  Location: [facility];  Service: Endoscopy;;   SMALL INTESTINE SURGERY     toenail removal Right 12/2017   great toenail     ROS: Review of Systems Negative except as stated above  PHYSICAL EXAM: There were no vitals taken for this visit.  Physical Exam  {female adult master:[id number]} {female adult master:[id number]}     Latest Ref Rng & Units 08/14/2021    1:17 PM 05/03/2021   11:30 AM 11/20/2020    2:37 PM  CMP  Glucose 70 - 99 mg/dL  160  116   BUN 8 - 27 mg/dL  27  19   Creatinine 0.44 - 1.00 mg/dL 1.30  1.05  1.21   Sodium 134 - 144 mmol/L  143  138   Potassium 3.5 - 5.2 mmol/L  4.3  3.9   Chloride 96 - 106 mmol/L  102  102   CO2 20 - 29 mmol/L  22  26   Calcium 8.7 - 10.3 mg/dL  9.7  9.4   Total Protein 6.0 - 8.5 g/dL  7.4    Total Bilirubin 0.0 - 1.2 mg/dL  0.5    Alkaline Phos 44 - 121 IU/L  91    AST 0 - 40 IU/L  17    ALT 0 - 32 IU/L  10     Lipid Panel     Component Value Date/Time   CHOL 197 05/03/2021 1130   TRIG 177 (H) 05/03/2021 1130   HDL 79 05/03/2021 1130   CHOLHDL 2.5 05/03/2021 1130   LDLCALC 89 05/03/2021 1130    CBC    Component Value Date/Time   WBC 6.4 04/26/2018 1551   RBC 5.12 (H) 04/26/2018 1551   HGB 14.5 04/26/2018 1551   HCT 43.0 04/26/2018 1551   PLT 179.0 04/26/2018 1551   MCV 83.9 04/26/2018 1551   MCH 27.6 11/27/2017 1457   MCHC 33.7 04/26/2018  1551   RDW 14.1 04/26/2018 1551   LYMPHSABS 2.2 04/26/2018 1551   MONOABS 0.6 04/26/2018 1551   EOSABS 0.0 04/26/2018 1551   BASOSABS 0.0 04/26/2018 1551    ASSESSMENT AND PLAN:  There are no diagnoses linked to this encounter.   Patient was given the opportunity to ask questions.  Patient verbalized understanding of the plan and was able to repeat key elements of the plan. Patient was given clear instructions to go to Emergency Department or return to medical center if symptoms don't improve, worsen, or new  problems develop.The patient verbalized understanding.   No orders of the defined types were placed in this encounter.    Requested Prescriptions    No prescriptions requested or ordered in this encounter    No follow-ups on file.  Camillia Herter, NP

## 2022-03-10 NOTE — Telephone Encounter (Signed)
Summary: Left ear pain, with sinus pressure, coughing Advice   Pt is calling to Left Ear Pain, with sinus pressure coughing has taken OTC medication, saline. Apt scheduled for Friday. Please advise         Called pt - [id number]

## 2022-03-10 NOTE — Telephone Encounter (Signed)
  Chief Complaint: Sinus pain, earache Symptoms: Left earache, sinus pain, blood tinged sinus drainage at times, left sided lymph nodes swollen. Frequency: 1 month Pertinent Negatives: Patient denies fever Disposition: '[]'$ ED /'[]'$ Urgent Care (no appt availability in office) / '[x]'$ Appointment(In office/virtual)/ '[]'$  [facility]/ '[]'$ Home Care/ '[]'$ Refused Recommended Disposition /'[]'$ [facility]/ '[]'$  Follow-up with PCP Additional Notes: States has been trying to treat herself for 1 month, now with ear pain and swollen nodes. First available with Amy Friday.  Appt secured for tomorrow with Cari. Care advise provided, pt verbalizes understanding. Reason for Disposition  Earache  Answer Assessment - Initial Assessment Questions 1. LOCATION: "Where does it hurt?"      Left earache,sinus pain 2. ONSET: "When did the sinus pain start?"  (e.g., hours, days)      1 month ago 3. SEVERITY: "How bad is the pain?"   (Scale 1-10; mild, moderate or severe)   - MILD (1-3): doesn't interfere with normal activities    - MODERATE (4-7): interferes with normal activities (e.g., work or school) or awakens from sleep   - SEVERE (8-10): excruciating pain and patient unable to do any normal activities        moderate 4. RECURRENT SYMPTOM: "Have you ever had sinus problems before?" If Yes, ask: "When was the last time?" and "What happened that time?"      Yes 5. NASAL CONGESTION: "Is the nose blocked?" If Yes, ask: "Can you open it or must you breathe through your mouth?"     Yes 6. NASAL DISCHARGE: "Do you have discharge from your nose?" If so ask, "What color?"     Blood tinged, was watery. Not as bad 7. FEVER: "Do you have a fever?" If Yes, ask: "What is it, how was it measured, and when did it start?"      No 8. OTHER SYMPTOMS: "Do you have any other symptoms?" (e.g., sore throat, cough, earache, difficulty breathing)     Lymph nodes left side swollen  Protocols used: Sinus Pain or  Congestion-A-AH

## 2022-03-11 ENCOUNTER — Encounter: Payer: Self-pay | Admitting: Physician Assistant

## 2022-03-11 ENCOUNTER — Ambulatory Visit (INDEPENDENT_AMBULATORY_CARE_PROVIDER_SITE_OTHER): Payer: Medicare Other | Admitting: Physician Assistant

## 2022-03-11 VITALS — BP 122/82 | HR 86 | Wt 159.0 lb

## 2022-03-11 DIAGNOSIS — J0110 Acute frontal sinusitis, unspecified: Secondary | ICD-10-CM | POA: Diagnosis not present

## 2022-03-11 MED ORDER — AMOXICILLIN-POT CLAVULANATE 875-125 MG PO TABS: 1.0000 | ORAL_TABLET | Freq: Two times a day (BID) | ORAL | 0 refills | Status: AC

## 2022-03-11 NOTE — Patient Instructions (Signed)
To help with your sinus infection, you are going to take Augmentin twice daily for 7 days.  Continue using your over-the-counter allergy medication on a daily basis, and nasal spray as needed  I hope that you feel better soon, please let us know if there is anything else we can do for you  Courtney Rad, PA-C Physician Assistant [facility] [email or web address]   Sinus Infection, Adult A sinus infection, also called sinusitis, is inflammation of your sinuses. Sinuses are hollow spaces in the bones around your face. Your sinuses are located: Around your eyes. In the middle of your forehead. Behind your nose. In your cheekbones. Mucus normally drains out of your sinuses. When your nasal tissues become inflamed or swollen, mucus can become trapped or blocked. This allows bacteria, viruses, and fungi to grow, which leads to infection. Most infections of the sinuses are caused by a virus. A sinus infection can develop quickly. It can last for up to 4 weeks (acute) or for more than 12 weeks (chronic). A sinus infection often develops after a cold. What are the causes? This condition is caused by anything that creates swelling in the sinuses or stops mucus from draining. This includes: Allergies. Asthma. Infection from bacteria or viruses. Deformities or blockages in your nose or sinuses. Abnormal growths in the nose (nasal polyps). Pollutants, such as chemicals or irritants in the air. Infection from fungi. This is rare. What increases the risk? You are more likely to develop this condition if you: Have a weak body defense system (immune system). Do a lot of swimming or diving. Overuse nasal sprays. Smoke. What are the signs or symptoms? The main symptoms of this condition are pain and a feeling of pressure around the affected sinuses. Other symptoms include: Stuffy nose or congestion that makes it difficult to breathe through your  nose. Thick yellow or greenish drainage from your nose. Tenderness, swelling, and warmth over the affected sinuses. A cough that may get worse at night. Decreased sense of smell and taste. Extra mucus that collects in the throat or the back of the nose (postnasal drip) causing a sore throat or bad breath. Tiredness (fatigue). Fever. How is this diagnosed? This condition is diagnosed based on: Your symptoms. Your medical history. A physical exam. Tests to find out if your condition is acute or chronic. This may include: Checking your nose for nasal polyps. Viewing your sinuses using a device that has a light (endoscope). Testing for allergies or bacteria. Imaging tests, such as an MRI or CT scan. In rare cases, a bone biopsy may be done to rule out more serious types of fungal sinus disease. How is this treated? Treatment for a sinus infection depends on the cause and whether your condition is chronic or acute. If caused by a virus, your symptoms should go away on their own within 10 days. You may be given medicines to relieve symptoms. They include: Medicines that shrink swollen nasal passages (decongestants). A spray that eases inflammation of the nostrils (topical intranasal corticosteroids). Rinses that help get rid of thick mucus in your nose (nasal saline washes). Medicines that treat allergies (antihistamines). Over-the-counter pain relievers. If caused by bacteria, your health care provider may recommend waiting to see if your symptoms improve. Most bacterial infections will get better without antibiotic medicine. You may be given antibiotics if you have: A severe infection. A weak immune system. If caused by narrow nasal passages or nasal polyps, surgery may be needed. Follow these instructions at  home: Medicines Take, use, or apply over-the-counter and prescription medicines only as told by your health care provider. These may include nasal sprays. If you were prescribed an  antibiotic medicine, take it as told by your health care provider. Do not stop taking the antibiotic even if you start to feel better. Hydrate and humidify  Drink enough fluid to keep your urine pale yellow. Staying hydrated will help to thin your mucus. Use a cool mist humidifier to keep the humidity level in your home above 50%. Inhale steam for 10-15 minutes, 3-4 times a day, or as told by your health care provider. You can do this in the bathroom while a hot shower is running. Limit your exposure to cool or dry air. Rest Rest as much as possible. Sleep with your head raised (elevated). Make sure you get enough sleep each night. General instructions  Apply a warm, moist washcloth to your face 3-4 times a day or as told by your health care provider. This will help with discomfort. Use nasal saline washes as often as told by your health care provider. Wash your hands often with soap and water to reduce your exposure to germs. If soap and water are not available, use hand sanitizer. Do not smoke. Avoid being around people who are smoking (secondhand smoke). Keep all follow-up visits. This is important. Contact a health care provider if: You have a fever. Your symptoms get worse. Your symptoms do not improve within 10 days. Get help right away if: You have a severe headache. You have persistent vomiting. You have severe pain or swelling around your face or eyes. You have vision problems. You develop confusion. Your neck is stiff. You have trouble breathing. These symptoms may be an emergency. Get help right away. Call 911. Do not wait to see if the symptoms will go away. Do not drive yourself to the hospital. Summary A sinus infection is soreness and inflammation of your sinuses. Sinuses are hollow spaces in the bones around your face. This condition is caused by nasal tissues that become inflamed or swollen. The swelling traps or blocks the flow of mucus. This allows bacteria,  viruses, and fungi to grow, which leads to infection. If you were prescribed an antibiotic medicine, take it as told by your health care provider. Do not stop taking the antibiotic even if you start to feel better. Keep all follow-up visits. This is important. This information is not intended to replace advice given to you by your health care provider. Make sure you discuss any questions you have with your health care provider. Document Revised: 01/22/2021 Document Reviewed: 01/22/2021 Elsevier Patient Education  [place].

## 2022-03-11 NOTE — Progress Notes (Signed)
Established Patient Office Visit  Subjective   Patient ID: Courtney [name], female    DOB: 1955-05-24  Age: 67 y.o. MRN: [id number]  Chief Complaint  Patient presents with   Sinusitis    Patient hasd been experiencing nose bleeds, Left ear pain, Left nodule has been sore and swollen, OTC used Claritin, Flonase.     States that she has been experiencing sinus pain and pressure, blood tinged sinus drainage, pain in her left ear and left-sided swollen lymph nodes.  States this has been going on for the past month.  States that she has been using Flonase, Claritin and nasal saline without much relief.  Denies sick contacts.  Eating and drinking okay     Past Medical History:  Diagnosis Date   Abnormal ECG 07/18/2015   Arthritis    Asthma    Bowel obstruction ([place])    Depression with anxiety 02/09/2015   mild xanax prn   Diabetes mellitus ([place]) 07/18/2015   type 2   Diverticulitis    Dysrhythmia    abnormal ekgs worked up   Essential tremor 03/31/2016   Fibromyalgia    Gait difficulty 02/09/2015   Gallstones    GERD (gastroesophageal reflux disease)    HLD (hyperlipidemia)    Hypersomnia 11/09/2015   Hypertension    no meds   Hyperthyroidism    patient states has no problems at present-01/25/2019   Hypothyroidism    patient states no problems at present 01/25/2019   IBS (irritable bowel syndrome)    Insomnia 02/09/2015   Migraines    MS (multiple sclerosis) ([place])    Multiple joint pain 03/31/2016   Numbness in both hands 03/18/2016   Other fatigue 02/09/2015   Peptic ulcer    Pneumonia    PONV (postoperative nausea and vomiting)    Urinary hesitancy 02/09/2015   Vision abnormalities    Social History   Socioeconomic History   Marital status: Married    Spouse name: Jimmie   Number of children: 4   Years of education: Not on file   Highest education level: 12th grade  Occupational History   Occupation: disabled  Tobacco Use   Smoking status: Former     Packs/day: 0.25    Years: 5.00    Total pack years: 1.25    Types: Cigarettes    Quit date: 1990    Years since quitting: 34.0    Passive exposure: Never   Smokeless tobacco: Never  Vaping Use   Vaping Use: Never used  Substance and Sexual Activity   Alcohol use: Yes    Comment: rarely   Drug use: No   Sexual activity: Yes  Other Topics Concern   Not on file  Social History Narrative   Pt is right-handed. She lives with her husband in a 2 story house. Pt avoids caffeine. She and her husband go to the gym, lately once weekly, previously 3 times a week.    Social Determinants of Health   Financial Resource Strain: Not on file  Food Insecurity: Not on file  Transportation Needs: Not on file  Physical Activity: Not on file  Stress: Not on file  Social Connections: Not on file  Intimate Partner Violence: Not on file   Family History  Problem Relation Age of Onset   Heart disease Mother    Kidney disease Mother    Diabetes Mother    Other Father 78       murdered   Multiple sclerosis Daughter  Multiple sclerosis Other    Diabetes Sister    Alcohol abuse Brother        ETOH and marijuana   Diabetes Brother    Diabetes Sister    Diabetes Brother    ALS Brother    Allergies  Allergen Reactions   Ibuprofen Other (See Comments)    Developed a ulcer  Other reaction(s): Other (See Comments) Developed a ulcer    Methylprednisolone Anaphylaxis, Shortness Of Breath, Palpitations and Other (See Comments)   Prednisone Anaphylaxis, Shortness Of Breath, Palpitations and Other (See Comments)   Bactrim [Sulfamethoxazole-Trimethoprim] Other (See Comments)    "blotchiness" redness, face swelling   Biaxin [Clarithromycin] Swelling   Naproxen Other (See Comments)    Developed a ulcer    Sulfa Antibiotics Other (See Comments) and Swelling    Other reaction(s): Angioedema Other reaction(s): Angioedema    Review of Systems  Constitutional:  Negative for chills and fever.   HENT:  Positive for congestion, ear pain and sinus pain. Negative for ear discharge.   Eyes: Negative.   Respiratory:  Positive for cough. Negative for sputum production, shortness of breath and wheezing.   Cardiovascular:  Negative for chest pain.  Gastrointestinal:  Negative for nausea and vomiting.  Genitourinary: Negative.   Musculoskeletal: Negative.   Skin: Negative.   Neurological: Negative.   Endo/Heme/Allergies: Negative.   Psychiatric/Behavioral: Negative.        Objective:     BP 122/82   Pulse 86   Wt 159 lb (72.1 kg)   SpO2 93%   BMI 28.62 kg/m    Physical Exam Vitals and nursing note reviewed.  Constitutional:      Appearance: Normal appearance.  HENT:     Head: Normocephalic and atraumatic.     Salivary Glands: Right salivary gland is diffusely enlarged and tender. Left salivary gland is diffusely enlarged and tender.     Right Ear: Tympanic membrane, ear canal and external ear normal.     Left Ear: External ear normal. Tympanic membrane is bulging.     Nose:     Right Turbinates: Enlarged and swollen.     Left Turbinates: Enlarged and swollen.     Right Sinus: Maxillary sinus tenderness and frontal sinus tenderness present.     Left Sinus: Maxillary sinus tenderness and frontal sinus tenderness present.     Mouth/Throat:     Lips: Pink.     Mouth: Mucous membranes are moist.     Pharynx: Oropharynx is clear.  Cardiovascular:     Rate and Rhythm: Normal rate and regular rhythm.     Pulses: Normal pulses.     Heart sounds: Normal heart sounds.  Pulmonary:     Effort: Pulmonary effort is normal.     Breath sounds: Normal breath sounds.  Musculoskeletal:        General: Normal range of motion.     Cervical back: Normal range of motion and neck supple.  Skin:    General: Skin is warm and dry.  Neurological:     General: No focal deficit present.     Mental Status: She is alert.  Psychiatric:        Mood and Affect: Mood normal.        Behavior:  Behavior normal.        Thought Content: Thought content normal.        Judgment: Judgment normal.        Assessment & Plan:   Problem List Items Addressed This Visit  None Visit Diagnoses     Acute non-recurrent frontal sinusitis    -  Primary   Relevant Medications   amoxicillin-clavulanate (AUGMENTIN) 875-125 MG tablet     1. Acute non-recurrent frontal sinusitis Trial Augmentin, patient education given on supportive care, red flags given for prompt reevaluation - amoxicillin-clavulanate (AUGMENTIN) 875-125 MG tablet; Take 1 tablet by mouth 2 (two) times daily for 7 days.  Dispense: 14 tablet; Refill: 0    I have reviewed the patient's medical history (PMH, PSH, Social History, Family History, Medications, and allergies) , and have been updated if relevant. I spent 20 minutes reviewing chart and  face to face time with patient.    Return if symptoms worsen or fail to improve.    Loraine Grip Mayers, PA-C

## 2022-03-14 ENCOUNTER — Ambulatory Visit: Payer: Medicare Other | Admitting: Family

## 2022-03-17 ENCOUNTER — Other Ambulatory Visit (HOSPITAL_COMMUNITY): Payer: Self-pay

## 2022-03-17 ENCOUNTER — Telehealth: Payer: Self-pay

## 2022-03-17 NOTE — Telephone Encounter (Signed)
Pharmacy Patient Advocate Encounter   Received notification from [employer] that prior authorization for RYBELSUS is required/requested.    PA via CoverMyMeds Key  [id number]  I see the chart that this medicine has been canceled due to patient could not tolerate  Should we cancel request or proceed with PA.  PLEASE BE ADVISE.  Sandre Kitty Rx Patient Advocate

## 2022-03-17 NOTE — Telephone Encounter (Signed)
Cancel PA for Rybelsus because patient couldn't tolerate medication

## 2022-03-18 ENCOUNTER — Other Ambulatory Visit: Payer: Self-pay | Admitting: Physician Assistant

## 2022-03-18 DIAGNOSIS — B9689 Other specified bacterial agents as the cause of diseases classified elsewhere: Secondary | ICD-10-CM

## 2022-03-18 MED ORDER — LEVOFLOXACIN 500 MG PO TABS: 500.0000 mg | ORAL_TABLET | Freq: Every day | ORAL | 0 refills | Status: AC

## 2022-03-18 MED ORDER — ESTRADIOL 10 MCG VA TABS: ORAL_TABLET | VAGINAL | 3 refills | Status: DC

## 2022-03-20 DIAGNOSIS — H40053 Ocular hypertension, bilateral: Secondary | ICD-10-CM | POA: Diagnosis not present

## 2022-03-20 DIAGNOSIS — K5903 Drug induced constipation: Secondary | ICD-10-CM | POA: Diagnosis not present

## 2022-03-20 DIAGNOSIS — E1140 Type 2 diabetes mellitus with diabetic neuropathy, unspecified: Secondary | ICD-10-CM | POA: Diagnosis not present

## 2022-03-20 DIAGNOSIS — E663 Overweight: Secondary | ICD-10-CM | POA: Diagnosis not present

## 2022-03-20 DIAGNOSIS — G47429 Narcolepsy in conditions classified elsewhere without cataplexy: Secondary | ICD-10-CM | POA: Diagnosis not present

## 2022-03-20 DIAGNOSIS — M545 Low back pain, unspecified: Secondary | ICD-10-CM | POA: Diagnosis not present

## 2022-03-20 DIAGNOSIS — T402X5A Adverse effect of other opioids, initial encounter: Secondary | ICD-10-CM | POA: Diagnosis not present

## 2022-03-20 DIAGNOSIS — N1831 Chronic kidney disease, stage 3a: Secondary | ICD-10-CM | POA: Diagnosis not present

## 2022-03-20 DIAGNOSIS — F3342 Major depressive disorder, recurrent, in full remission: Secondary | ICD-10-CM | POA: Diagnosis not present

## 2022-03-20 DIAGNOSIS — M546 Pain in thoracic spine: Secondary | ICD-10-CM | POA: Diagnosis not present

## 2022-03-20 DIAGNOSIS — Z9181 History of falling: Secondary | ICD-10-CM | POA: Diagnosis not present

## 2022-03-20 DIAGNOSIS — Z79899 Other long term (current) drug therapy: Secondary | ICD-10-CM | POA: Diagnosis not present

## 2022-03-24 ENCOUNTER — Other Ambulatory Visit (HOSPITAL_COMMUNITY): Payer: Self-pay

## 2022-03-24 DIAGNOSIS — J342 Deviated nasal septum: Secondary | ICD-10-CM | POA: Diagnosis not present

## 2022-03-24 DIAGNOSIS — J3489 Other specified disorders of nose and nasal sinuses: Secondary | ICD-10-CM | POA: Insufficient documentation

## 2022-03-24 DIAGNOSIS — J329 Chronic sinusitis, unspecified: Secondary | ICD-10-CM | POA: Diagnosis not present

## 2022-03-24 DIAGNOSIS — Z79899 Other long term (current) drug therapy: Secondary | ICD-10-CM | POA: Diagnosis not present

## 2022-04-01 ENCOUNTER — Encounter: Payer: Medicare Other | Admitting: Gastroenterology

## 2022-04-08 ENCOUNTER — Encounter: Payer: Self-pay | Admitting: Internal Medicine

## 2022-04-08 ENCOUNTER — Ambulatory Visit (INDEPENDENT_AMBULATORY_CARE_PROVIDER_SITE_OTHER): Payer: Medicare Other | Admitting: Internal Medicine

## 2022-04-08 VITALS — BP 130/80 | HR 90 | Ht 62.5 in | Wt 163.8 lb

## 2022-04-08 DIAGNOSIS — E1142 Type 2 diabetes mellitus with diabetic polyneuropathy: Secondary | ICD-10-CM

## 2022-04-08 DIAGNOSIS — E1122 Type 2 diabetes mellitus with diabetic chronic kidney disease: Secondary | ICD-10-CM

## 2022-04-08 DIAGNOSIS — Z794 Long term (current) use of insulin: Secondary | ICD-10-CM

## 2022-04-08 DIAGNOSIS — N1831 Chronic kidney disease, stage 3a: Secondary | ICD-10-CM

## 2022-04-08 LAB — POCT GLYCOSYLATED HEMOGLOBIN (HGB A1C): Hemoglobin A1C: 7.2 % — AB (ref 4.0–5.6)

## 2022-04-08 MED ORDER — DEXCOM G7 SENSOR MISC: 1.0000 | 3 refills | Status: DC

## 2022-04-08 MED ORDER — SEMAGLUTIDE(0.25 OR 0.5MG/DOS) 2 MG/3ML ~~LOC~~ SOPN: 0.5000 mg | PEN_INJECTOR | SUBCUTANEOUS | 3 refills | Status: DC

## 2022-04-08 NOTE — Progress Notes (Signed)
Name: Courtney [name]  Age/ Sex: 67 y.o., female   MRN/ DOB: [id number], September 25, 1955     PCP: Camillia Herter, NP   Reason for Endocrinology Evaluation: Type 2 Diabetes Mellitus  Initial Endocrine Consultative Visit: 06/20/2020    PATIENT IDENTIFIER: Courtney [name] is a 67 y.o. female with a past medical history of T2DM, MS , HTN and essential tremors. The patient has followed with Endocrinology clinic since 06/20/20 for consultative assistance with management of her diabetes.  DIABETIC HISTORY:  Courtney [name] was diagnosed with DM in 2008, Victoza caused nausea, Bydureon . Has been on insulin since 2011. Her hemoglobin A1c has ranged from 7.3% in 2017, peaking at 8.0% in years ago.   On her initial visit to our clinic she had an A1c of 6.4% , she was on Jardiance and novolog mix . She was having recurrent hypoglycemia at the time, we reduced insulin and continued Jardiance    Started Trulicity 07/6312 was stopped by 12/2021 due to complaints of loss of appetite and borborygmi  but opted to go back 04/2022  SUBJECTIVE:   During the last visit (12/05/2021): A1c 7.2%        Today (04/08/2022): Courtney [name] is here for a follow up on diabetes management.  She checks her blood sugars daily . The patient has had hypoglycemia episodes, she is symptomatic with these episodes   She is scheduled for colonoscopy 04/24/2022 Pending ventral hernia sx She does  not now believe that side effects were not related to Trulicity    She was seen by ENT for chronic sore throat, fiberoptic exam of the pharynx and larynx was unremarkable and she was advised to change her PPI 11/2021, she had another visit 03/24/2022   HOME DIABETES REGIMEN:  Jardiance 25 mg 1 tablet daily  Novolog Mix 20 units with breakfast and 24 units  supper      Statin: no ACE-I/ARB: no Prior Diabetic Education: no   METER DOWNLOAD SUMMARY: unable to download  76 - 198 mg/dL      DIABETIC  COMPLICATIONS: Microvascular complications:  Neuropathy Denies: retinopathy, CKD Last Eye Exam: Completed 02/05/2021  Macrovascular complications:   Denies: CAD, CVA, PVD   HISTORY:  Past Medical History:  Past Medical History:  Diagnosis Date   Abnormal ECG 07/18/2015   Arthritis    Asthma    Bowel obstruction ([place])    Depression with anxiety 02/09/2015   mild xanax prn   Diabetes mellitus ([place]) 07/18/2015   type 2   Diverticulitis    Dysrhythmia    abnormal ekgs worked up   Essential tremor 03/31/2016   Fibromyalgia    Gait difficulty 02/09/2015   Gallstones    GERD (gastroesophageal reflux disease)    HLD (hyperlipidemia)    Hypersomnia 11/09/2015   Hypertension    no meds   Hyperthyroidism    patient states has no problems at present-01/25/2019   Hypothyroidism    patient states no problems at present 01/25/2019   IBS (irritable bowel syndrome)    Insomnia 02/09/2015   Migraines    MS (multiple sclerosis) ([place])    Multiple joint pain 03/31/2016   Numbness in both hands 03/18/2016   Other fatigue 02/09/2015   Peptic ulcer    Pneumonia    PONV (postoperative nausea and vomiting)    Urinary hesitancy 02/09/2015   Vision abnormalities    Past Surgical History:  Past Surgical History:  Procedure Laterality Date  ABDOMINAL HYSTERECTOMY  1976   including oophorectomy, due to uterine cancer   APPENDECTOMY  1974   BIOPSY  01/31/2019   Procedure: BIOPSY;  Surgeon: Mauri Pole, MD;  Location: [facility];  Service: Endoscopy;;   CARPAL TUNNEL RELEASE Left    carpal tunnel surgery   CHOLECYSTECTOMY     2006   COLON SURGERY     SBO x 2   COLONOSCOPY WITH PROPOFOL N/A 01/31/2019   Procedure: COLONOSCOPY WITH PROPOFOL;  Surgeon: Mauri Pole, MD;  Location: [facility];  Service: Endoscopy;  Laterality: N/A;   EUS N/A 04/09/2017   Procedure: UPPER ENDOSCOPIC ULTRASOUND (EUS) LINEAR;  Surgeon: Milus Banister, MD;  Location: [facility];  Service:  Endoscopy;  Laterality: N/A;   HERNIA REPAIR     MENISCUS REPAIR Left    POLYPECTOMY  01/31/2019   Procedure: POLYPECTOMY;  Surgeon: Mauri Pole, MD;  Location: [facility];  Service: Endoscopy;;   SMALL INTESTINE SURGERY     toenail removal Right 12/2017   great toenail    Social History:  reports that she quit smoking about 34 years ago. Her smoking use included cigarettes. She has a 1.25 pack-year smoking history. She has never been exposed to tobacco smoke. She has never used smokeless tobacco. She reports current alcohol use. She reports that she does not use drugs. Family History:  Family History  Problem Relation Age of Onset   Heart disease Mother    Kidney disease Mother    Diabetes Mother    Other Father 16       murdered   Multiple sclerosis Daughter    Multiple sclerosis Other    Diabetes Sister    Alcohol abuse Brother        ETOH and marijuana   Diabetes Brother    Diabetes Sister    Diabetes Brother    ALS Brother      HOME MEDICATIONS: Allergies as of 04/08/2022       Reactions   Ibuprofen Other (See Comments)   Developed a ulcer  Other reaction(s): Other (See Comments) Developed a ulcer    Methylprednisolone Anaphylaxis, Shortness Of Breath, Palpitations, Other (See Comments)   Prednisone Anaphylaxis, Shortness Of Breath, Palpitations, Other (See Comments)   Bactrim [sulfamethoxazole-trimethoprim] Other (See Comments)   "blotchiness" redness, face swelling   Biaxin [clarithromycin] Swelling   Naproxen Other (See Comments)   Developed a ulcer    Sulfa Antibiotics Other (See Comments), Swelling   Other reaction(s): Angioedema Other reaction(s): Angioedema        Medication List        Accurate as of April 08, 2022  2:09 PM. If you have any questions, ask your nurse or doctor.          acetaminophen 500 MG tablet Commonly known as: TYLENOL Take 1,000 mg by mouth every 8 (eight) hours as needed for headache.   alprazolam 2 MG  tablet Commonly known as: XANAX Take 2 mg by mouth 3 (three) times daily as needed for anxiety.   cyclobenzaprine 10 MG tablet Commonly known as: FLEXERIL Take 10 mg by mouth 3 (three) times daily as needed.   Dexcom G7 Sensor Misc 1 Device by Does not apply route as directed. Started by: Dorita Sciara, MD   Estradiol 10 MCG Tabs vaginal tablet Place 1 tablet (10 mcg total) vaginally at bedtime for 14 days, THEN 1 tablet (10 mcg total) 2 (two) times a week. Start taking on: March 18, 2022  freestyle lancets 1 Device by Misc.(Non-Drug; Combo Route) route daily. USED TO CHECK BLOOD SUGAR TID   FREESTYLE LITE test strip Generic drug: glucose blood USE 1 STRIP IN THE MORNING AND AT BEDTIME AS INSTRUCTED   insulin aspart protamine - aspart (70-30) 100 UNIT/ML FlexPen Commonly known as: NOVOLOG 70/30 MIX Inject 20 Units into the skin daily with breakfast AND 22 Units daily with supper.   Insulin Pen Needle 32G X 4 MM Misc 1 Device by Does not apply route in the morning and at bedtime.   Jardiance 25 MG Tabs tablet Generic drug: empagliflozin TAKE 1 TABLET DAILY   latanoprost 0.005 % ophthalmic solution Commonly known as: XALATAN SMARTSIG:1 Drop(s) In Eye(s) Every Evening   loratadine 10 MG tablet Commonly known as: CLARITIN Take by mouth.   modafinil 100 MG tablet Commonly known as: PROVIGIL Take 100 mg by mouth.   multivitamin tablet Take 2 tablets by mouth daily. Gummies   naloxone 4 MG/0.1ML Liqd nasal spray kit Commonly known as: NARCAN Place 1 spray into the nose daily as needed (overdose).   ondansetron 4 MG tablet Commonly known as: ZOFRAN Take 1 tablet (4 mg total) by mouth every 6 (six) hours as needed for nausea or vomiting. Prior to colonoscopy prep   oxyCODONE 15 MG immediate release tablet Commonly known as: ROXICODONE Take 15 mg by mouth 5 (five) times daily as needed.   pantoprazole 40 MG tablet Commonly known as: PROTONIX Take 1  tablet (40 mg total) by mouth daily.   terconazole 0.8 % vaginal cream Commonly known as: TERAZOL 3 Place 1 applicator vaginally at bedtime.         OBJECTIVE:   Vital Signs: BP 130/80 (BP Location: Left Arm, Patient Position: Sitting, Cuff Size: Normal)   Pulse 90   Ht 5' 2.5" (1.588 m)   Wt 163 lb 12.8 oz (74.3 kg)   SpO2 90%   BMI 29.48 kg/m   Wt Readings from Last 3 Encounters:  04/08/22 163 lb 12.8 oz (74.3 kg)  03/11/22 159 lb (72.1 kg)  02/26/22 157 lb 3.2 oz (71.3 kg)     Exam: General: Pt appears well and is in NAD  Lungs: Clear with good BS bilat with no rales, rhonchi, or wheezes  Heart: RRR  Abdomen: Normoactive bowel sounds, soft, nontender, without masses or organomegaly palpable  Extremities: No pretibial edema.   Neuro: MS is good with appropriate affect, pt is alert and Ox3   DM Foot Exam 01/28/2021   The skin of the feet is intact without sores or ulcerations. The pedal pulses are 2+ on right and 2+ on left. The sensation is intact to a screening 5.07, 10 gram monofilament bilaterally    DATA REVIEWED:  Lab Results  Component Value Date   HGBA1C 7.2 (A) 04/08/2022   HGBA1C 7.2 (A) 12/05/2021   HGBA1C 6.8 (A) 08/05/2021     ASSESSMENT / PLAN / RECOMMENDATIONS:   1) Type 2 Diabetes Mellitus, Optimally controlled, With neuropathic and CKD III complications - Most recent A1c of 7.2 %. Goal A1c < 7.0 %.      - A1c slightly above goal  -Intolerant to metformin and Victoza in the past -She is having multiple symptoms that she attributes to Trulicity, will discontinue  -We discussed CGM technology but she has history of adhesive irritation and not keen at this time -I did advise the patient against using extra insulin for correction as she is on the insulin mix and this will  increase her risk of hypoglycemia.  We discussed switching insulin to mix to basal/prandial but she understands that she will need to give herself 3-4 injections daily,  and she will be required to check her glucose at least 3 times daily before each meal -The patient opted to remain on the NovoLog Mix and we will make the following increase -In the meantime she will work on checking her glucose twice daily for now   MEDICATIONS: - STOP Trulicity  - Continue Jardiance 25 mg 1 tablet daily  - Increase  Novolog 20  units with breakfast and 22 units with  supper     EDUCATION / INSTRUCTIONS: BG monitoring instructions: Patient is instructed to check her blood sugars 2 times a day, breakfast and supper. Call [facility] if: BG persistently < 70  I reviewed the Rule of 15 for the treatment of hypoglycemia in detail with the patient. Literature supplied.   2) Diabetic complications:  Eye: Does not have known diabetic retinopathy.  Neuro/ Feet: Does  have known diabetic peripheral neuropathy .  Renal: Patient does not have known baseline CKD. She   is not on an ACEI/ARB at present.      3) Lipids: Patient is has been off statins due to severe cramps. LDL 89 mg/dL during labs 05/2021.  In the past she has declined statin therapy  due to cramps     F/U in 6 months      Signed electronically by: Mack Guise, MD  [facility]  [facility] [address] [place], [place] [address] Phone: [phone] FAX: [phone]   CC: Camillia Herter, NP [place] [address] Phone: [phone]  Fax: [phone]  Return to Endocrinology clinic as below: Future Appointments  Date Time Provider [place]  04/21/2022  2:00 PM Newt Minion, MD [facility] None  05/02/2022  3:35 PM Barbarann Ehlers Junius Creamer., NP [facility]  06/06/2022  2:40 PM Dayln Tugwell, Melanie Crazier, MD [facility] None

## 2022-04-08 NOTE — Patient Instructions (Addendum)
-   Start Ozempic 0.25 mg ONCE weekly for 6 weeks, than increase to 0.5  - Continue Jardiance 25 mg 1 tablet daily  - Change  Novolog Mix 20 units with breakfast and 22 units with supper      HOW TO TREAT LOW BLOOD SUGARS (Blood sugar LESS THAN 70 MG/DL) Please follow the RULE OF 15 for the treatment of hypoglycemia treatment (when your (blood sugars are less than 70 mg/dL)   STEP 1: Take 15 grams of carbohydrates when your blood sugar is low, which includes:  3-4 GLUCOSE TABS  OR 3-4 OZ OF JUICE OR REGULAR SODA OR ONE TUBE OF GLUCOSE GEL    STEP 2: RECHECK blood sugar in 15 MINUTES STEP 3: If your blood sugar is still low at the 15 minute recheck --> then, go back to STEP 1 and treat AGAIN with another 15 grams of carbohydrates.

## 2022-04-15 ENCOUNTER — Encounter: Payer: Self-pay | Admitting: Internal Medicine

## 2022-04-16 ENCOUNTER — Encounter (HOSPITAL_COMMUNITY): Payer: Self-pay | Admitting: Gastroenterology

## 2022-04-16 NOTE — Progress Notes (Signed)
Attempted to obtain medical history via telephone, unable to reach at this time. HIPAA compliant voicemail message left requesting return call to pre surgical testing department. 

## 2022-04-17 DIAGNOSIS — F411 Generalized anxiety disorder: Secondary | ICD-10-CM | POA: Diagnosis not present

## 2022-04-17 DIAGNOSIS — Z79899 Other long term (current) drug therapy: Secondary | ICD-10-CM | POA: Diagnosis not present

## 2022-04-18 ENCOUNTER — Ambulatory Visit: Payer: Self-pay | Admitting: Surgery

## 2022-04-18 DIAGNOSIS — Z9181 History of falling: Secondary | ICD-10-CM | POA: Diagnosis not present

## 2022-04-18 DIAGNOSIS — K439 Ventral hernia without obstruction or gangrene: Secondary | ICD-10-CM | POA: Diagnosis not present

## 2022-04-18 DIAGNOSIS — Z79899 Other long term (current) drug therapy: Secondary | ICD-10-CM | POA: Diagnosis not present

## 2022-04-18 DIAGNOSIS — M546 Pain in thoracic spine: Secondary | ICD-10-CM | POA: Diagnosis not present

## 2022-04-18 DIAGNOSIS — Z6830 Body mass index (BMI) 30.0-30.9, adult: Secondary | ICD-10-CM | POA: Diagnosis not present

## 2022-04-18 DIAGNOSIS — M545 Low back pain, unspecified: Secondary | ICD-10-CM | POA: Diagnosis not present

## 2022-04-18 NOTE — H&P (Signed)
History of Present Illness: Courtney [name] is a 67 y.o. female who is seen today for follow up of a hernia. She first saw me in July and we discussed repair of her incisional hernia as a joint case with a panniculectomy by Dr. Erin Hearing, however this was never scheduled. She recently saw plastic surgery again and has elected not to undergo panniculectomy at this time. She is having some abdominal pain in the supraumbilical area that she is concerned is from the hernia, and she would like to proceed with repair.   Her most recent Hgb A1c was 7.2 on 2/6. Prior abdominal surgeries include hernia repair in the 1990s (in [place]), as well as two surgeries for SBO in [place] in 2008. She is not a smoker (quit many years ago).     Review of Systems: A complete review of systems was obtained from the patient.  I have reviewed this information and discussed as appropriate with the patient.  See HPI as well for other ROS.       Medical History: Past Medical History Past Medical History: Diagnosis Date  Arthritis    Asthma, unspecified asthma severity, unspecified whether complicated, unspecified whether persistent    Diabetes mellitus without complication (CMS-HCC)    Multiple sclerosis (CMS-HCC)    Thyroid disease        Patient Active Problem List Diagnosis  Supraumbilical hernia     Past Surgical History Past Surgical History: Procedure Laterality Date  Knee Surgery  Left 2007  Hand Surgery    2009  COLON SURGERY       SBO  HERNIA REPAIR          Allergies Allergies Allergen Reactions  Ibuprofen Other (See Comments)     Other reaction(s): Other (See Comments)  Developed a ulcer    Developed a ulcer   Other reaction(s): Other (See Comments)  Developed a ulcer    Other reaction(s): Other (See Comments)  Developed a ulcer  Methylprednisolone Anaphylaxis, Palpitations, Shortness Of Breath and Other (See Comments)  Clarithromycin Swelling  Liraglutide Other (See  Comments) and Nausea  Sulfamethoxazole-Trimethoprim Hives and Other (See Comments)     Other reaction(s): Other (See Comments)  "blotchiness" redness, face swelling   "blotchiness" redness, face swelling  Hydromorphone (Bulk) Unknown  Metformin Unknown  Sulfa (Sulfonamide Antibiotics) Angioedema and Other (See Comments)     Other reaction(s): Angioedema  Iodinated Contrast Media Hives      Current Outpatient Medications on File Prior to Visit Medication Sig Dispense Refill  alprazolam (XANAX ORAL)        calcium carbonate 500 mg calcium (1,250 mg) tablet Take 500 mg of elemental by mouth 2 (two) times daily with meals      cholecalciferol, vitamin D3, 10 mcg (400 unit) Cap Take by mouth      cyanocobalamin-cobamamide 5,000-100 mcg Lozg Place under the tongue      empagliflozin (JARDIANCE) 25 mg tablet Take 1 tablet by mouth once daily      insulin ASPART PROTAMINE-ASPART (NOVOLOG MIX, 70-30,) 100 unit/mL (70-30) injection Inject subcutaneously      multivitamin tablet Take by mouth      oxyCODONE (ROXICODONE) 15 MG immediate release tablet TAKE 1 TABLET BY MOUTH FIVE TIMES DAILY AS NEEDED      pantoprazole (PROTONIX) 40 MG DR tablet Take 40 mg by mouth once daily      TRULICITY [id number] [id number] mL subcutaneous pen injector Inject subcutaneously       No current  facility-administered medications on file prior to visit.     Family History No family history on file.     Social History   Tobacco Use Smoking Status Former  Types: Cigarettes  Quit date: 1989  Years since quitting: 35.1 Smokeless Tobacco Never     Social History Social History    Socioeconomic History  Marital status: Married Tobacco Use  Smoking status: Former     Types: Cigarettes     Quit date: 1989     Years since quitting: 35.1  Smokeless tobacco: Never Substance and Sexual Activity  Alcohol use: Not Currently  Drug use: Never      Objective:     Vitals:   04/18/22  1356 BP: 131/74 Pulse: 78 Temp: 36.1 C (97 F) SpO2: (!) 90% Weight: 74.4 kg (164 lb) Height: 157.5 cm (5' 2"$ )   Body mass index is 30 kg/m.   Physical Exam Vitals reviewed.  Constitutional:      General: She is not in acute distress.    Appearance: Normal appearance.  Pulmonary:     Effort: Pulmonary effort is normal. No respiratory distress.  Abdominal:     General: There is no distension.     Palpations: Abdomen is soft.     Tenderness: There is no abdominal tenderness.     Comments: Well-healed midline and right paramedian surgical scars. There is mild tenderness to palpation in the supraumbilical area. Hernia defect is not appreciable on exam.   Skin:    General: Skin is warm and dry.  Neurological:     General: No focal deficit present.     Mental Status: She is alert and oriented to person, place, and time.      Assessment and Plan:    Diagnoses and all orders for this visit:   Supraumbilical hernia     This is a 67 yo female with a supraumbilical incisional hernia. I again reviewed her previous CT from June 2023. This shows a small hernia just to the right of midline above the umbilicus. She does have pain in this area, but I discussed her pain may be multifactorial and hernia repair may not provide complete relief. The hernia is not appreciable on exam today, thus I recommended a laparoscopic approach to identify the hernia. I discussed this may require adhesiolysis if she has extensive adhesions from prior surgery. She will also require mesh placement. I reviewed the risks and benefits of surgery, including bleeding and infection, and I reviewed the postoperative activity restrictions. She expressed understanding and agrees to proceed with surgery. She will be contacted to schedule an elective surgery date.  Michaelle Birks, [facility], Hepatobiliary and Pancreatic Surgery 04/18/22 2:37 PM

## 2022-04-18 NOTE — H&P (View-Only) (Signed)
History of Present Illness: Courtney [name] is a 67 y.o. female who is seen today for follow up of a hernia. She first saw me in July and we discussed repair of her incisional hernia as a joint case with a panniculectomy by Dr. Erin Hearing, however this was never scheduled. She recently saw plastic surgery again and has elected not to undergo panniculectomy at this time. She is having some abdominal pain in the supraumbilical area that she is concerned is from the hernia, and she would like to proceed with repair.   Her most recent Hgb A1c was 7.2 on 2/6. Prior abdominal surgeries include hernia repair in the 1990s (in [place]), as well as two surgeries for SBO in [place] in 2008. She is not a smoker (quit many years ago).     Review of Systems: A complete review of systems was obtained from the patient.  I have reviewed this information and discussed as appropriate with the patient.  See HPI as well for other ROS.       Medical History: Past Medical History Past Medical History: Diagnosis Date  Arthritis    Asthma, unspecified asthma severity, unspecified whether complicated, unspecified whether persistent    Diabetes mellitus without complication (CMS-HCC)    Multiple sclerosis (CMS-HCC)    Thyroid disease        Patient Active Problem List Diagnosis  Supraumbilical hernia     Past Surgical History Past Surgical History: Procedure Laterality Date  Knee Surgery  Left 2007  Hand Surgery    2009  COLON SURGERY       SBO  HERNIA REPAIR          Allergies Allergies Allergen Reactions  Ibuprofen Other (See Comments)     Other reaction(s): Other (See Comments)  Developed a ulcer    Developed a ulcer   Other reaction(s): Other (See Comments)  Developed a ulcer    Other reaction(s): Other (See Comments)  Developed a ulcer  Methylprednisolone Anaphylaxis, Palpitations, Shortness Of Breath and Other (See Comments)  Clarithromycin Swelling  Liraglutide Other (See  Comments) and Nausea  Sulfamethoxazole-Trimethoprim Hives and Other (See Comments)     Other reaction(s): Other (See Comments)  "blotchiness" redness, face swelling   "blotchiness" redness, face swelling  Hydromorphone (Bulk) Unknown  Metformin Unknown  Sulfa (Sulfonamide Antibiotics) Angioedema and Other (See Comments)     Other reaction(s): Angioedema  Iodinated Contrast Media Hives      Current Outpatient Medications on File Prior to Visit Medication Sig Dispense Refill  alprazolam (XANAX ORAL)        calcium carbonate 500 mg calcium (1,250 mg) tablet Take 500 mg of elemental by mouth 2 (two) times daily with meals      cholecalciferol, vitamin D3, 10 mcg (400 unit) Cap Take by mouth      cyanocobalamin-cobamamide 5,000-100 mcg Lozg Place under the tongue      empagliflozin (JARDIANCE) 25 mg tablet Take 1 tablet by mouth once daily      insulin ASPART PROTAMINE-ASPART (NOVOLOG MIX, 70-30,) 100 unit/mL (70-30) injection Inject subcutaneously      multivitamin tablet Take by mouth      oxyCODONE (ROXICODONE) 15 MG immediate release tablet TAKE 1 TABLET BY MOUTH FIVE TIMES DAILY AS NEEDED      pantoprazole (PROTONIX) 40 MG DR tablet Take 40 mg by mouth once daily      TRULICITY [id number] [id number] mL subcutaneous pen injector Inject subcutaneously       No current  facility-administered medications on file prior to visit.     Family History No family history on file.     Social History   Tobacco Use Smoking Status Former  Types: Cigarettes  Quit date: 1989  Years since quitting: 35.1 Smokeless Tobacco Never     Social History Social History    Socioeconomic History  Marital status: Married Tobacco Use  Smoking status: Former     Types: Cigarettes     Quit date: 1989     Years since quitting: 35.1  Smokeless tobacco: Never Substance and Sexual Activity  Alcohol use: Not Currently  Drug use: Never      Objective:     Vitals:   04/18/22  1356 BP: 131/74 Pulse: 78 Temp: 36.1 C (97 F) SpO2: (!) 90% Weight: 74.4 kg (164 lb) Height: 157.5 cm ('5\' 2"'$ )   Body mass index is 30 kg/m.   Physical Exam Vitals reviewed.  Constitutional:      General: She is not in acute distress.    Appearance: Normal appearance.  Pulmonary:     Effort: Pulmonary effort is normal. No respiratory distress.  Abdominal:     General: There is no distension.     Palpations: Abdomen is soft.     Tenderness: There is no abdominal tenderness.     Comments: Well-healed midline and right paramedian surgical scars. There is mild tenderness to palpation in the supraumbilical area. Hernia defect is not appreciable on exam.   Skin:    General: Skin is warm and dry.  Neurological:     General: No focal deficit present.     Mental Status: She is alert and oriented to person, place, and time.      Assessment and Plan:    Diagnoses and all orders for this visit:   Supraumbilical hernia     This is a 67 yo female with a supraumbilical incisional hernia. I again reviewed her previous CT from June 2023. This shows a small hernia just to the right of midline above the umbilicus. She does have pain in this area, but I discussed her pain may be multifactorial and hernia repair may not provide complete relief. The hernia is not appreciable on exam today, thus I recommended a laparoscopic approach to identify the hernia. I discussed this may require adhesiolysis if she has extensive adhesions from prior surgery. She will also require mesh placement. I reviewed the risks and benefits of surgery, including bleeding and infection, and I reviewed the postoperative activity restrictions. She expressed understanding and agrees to proceed with surgery. She will be contacted to schedule an elective surgery date.  Michaelle Birks, [facility], Hepatobiliary and Pancreatic Surgery 04/18/22 2:37 PM

## 2022-04-21 ENCOUNTER — Ambulatory Visit (INDEPENDENT_AMBULATORY_CARE_PROVIDER_SITE_OTHER): Payer: Medicare Other | Admitting: Orthopedic Surgery

## 2022-04-21 DIAGNOSIS — M545 Low back pain, unspecified: Secondary | ICD-10-CM | POA: Diagnosis not present

## 2022-04-21 DIAGNOSIS — M5410 Radiculopathy, site unspecified: Secondary | ICD-10-CM | POA: Diagnosis not present

## 2022-04-22 ENCOUNTER — Encounter: Payer: Self-pay | Admitting: Orthopedic Surgery

## 2022-04-22 NOTE — Progress Notes (Signed)
Office Visit Note   Patient: Courtney [name]           Date of Birth: November 11, 1955           MRN: [id number] Visit Date: 04/21/2022              Requested by: Camillia Herter, NP [address] [place],  [place] [address] PCP: Camillia Herter, NP  Chief Complaint  Patient presents with   Right Hip - Pain      HPI: Patient is a 67 year old woman who presents with radicular pain from the right buttocks and thigh travels down to her foot.  She previously has an MRI scan from January 2021.  Patient also has had epidural steroid injection with Dr. Ernestina Patches for her neck a few years ago.  She states she has had epidural steroid injections in [place] for her back.  She is scheduled for hernia surgery this March.  Assessment & Plan: Visit Diagnoses:  1. Lumbar spine pain   2. Radicular pain of right lower extremity     Plan: Will have patient follow-up with Dr. Ernestina Patches for evaluation for epidural steroid injection.  Follow-Up Instructions: Return if symptoms worsen or fail to improve.   Ortho Exam  Patient is alert, oriented, no adenopathy, well-dressed, normal affect, normal respiratory effort. Examination patient has a negative straight leg raise on the right has radicular pain from the right buttocks to the lateral aspect of the right leg down to the right foot.  She has no focal motor weakness.  Review of the MRI scan shows degenerative disc of the lumbar spine with bulging disks and facet hypertrophy at L4-5 possibly involving the descending right L5 nerve root.  Imaging: No results found. No images are attached to the encounter.  Labs: Lab Results  Component Value Date   HGBA1C 7.2 (A) 04/08/2022   HGBA1C 7.2 (A) 12/05/2021   HGBA1C 6.8 (A) 08/05/2021   ESRSEDRATE 14 11/27/2017   ESRSEDRATE 7 03/31/2016   CRP 2.4 03/31/2016   LABURIC 4.5 11/27/2017   REPTSTATUS 03/03/2020 FINAL 02/29/2020   CULT  02/29/2020    NO GROUP A STREP (S.PYOGENES)  ISOLATED Performed at [facility], [place] [address] [place], [place] [address]      Lab Results  Component Value Date   ALBUMIN 5.1 (H) 05/03/2021   ALBUMIN 4.9 04/26/2018   ALBUMIN 4.5 04/01/2017    No results found for: "MG" No results found for: "VD25OH"  No results found for: "PREALBUMIN"    Latest Ref Rng & Units 04/26/2018    3:51 PM 11/27/2017    2:57 PM 04/01/2017    5:16 PM  CBC EXTENDED  WBC 4.0 - 10.5 K/uL 6.4  5.2  7.8   RBC 3.87 - 5.11 Mil/uL 5.12  4.64  4.96   Hemoglobin 12.0 - 15.0 g/dL 14.5  12.8  14.0   HCT 36.0 - 46.0 % 43.0  38.7  41.3   Platelets 150.0 - 400.0 K/uL 179.0  173  151   NEUT# 1.4 - 7.7 K/uL 3.5  2,080    Lymph# 0.7 - 4.0 K/uL 2.2  2,506       There is no height or weight on file to calculate BMI.  Orders:  Orders Placed This Encounter  Procedures   Ambulatory referral to Physical Medicine Rehab   No orders of the defined types were placed in this encounter.    Procedures: No procedures performed  Clinical Data: No additional findings.  ROS:  All other systems negative, except as noted in the HPI. Review of Systems  Objective: Vital Signs: There were no vitals taken for this visit.  Specialty Comments:  No specialty comments available.  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   Sebaceous cyst 05/03/2021   Seborrheic dermatitis of scalp 05/03/2021   Biceps tendonitis on left 04/30/2021   Stage 3a chronic kidney disease ([place]) 10/21/2020   Type 2 diabetes mellitus with stage 3a chronic kidney disease, with long-term current use of insulin ([place]) 10/21/2020   Type 2 diabetes mellitus with diabetic polyneuropathy, with long-term current use of insulin ([place]) 06/20/2020   Dyslipidemia, goal  LDL below 70 04/10/2020   Episode of recurrent major depressive disorder ([place]) 04/10/2020   Pain in left shoulder 04/04/2020   Vaginal dryness, menopausal 07/07/2019   Tremor 05/30/2019   Memory loss 05/03/2019   History of colonic polyps 03/16/2019   Fracture of distal phalanx of right ring finger 03/15/2019   Facet arthritis, degenerative, 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   Polyp of transverse colon    Special screening for malignant neoplasms, colon    Polyp of sigmoid colon    Anxiety 09/09/2018   GERD (gastroesophageal reflux disease) 09/09/2018   Cervicalgia 08/10/2018   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   Laryngopharyngeal reflux (LPR) 05/14/2018   Throat pain 05/14/2018   Loss of weight    RLQ abdominal pain 03/27/2017   Bloating 03/27/2017   Abnormal finding on imaging 03/27/2017   Dilation of biliary tract 03/27/2017   Nausea 03/27/2017   Chronic sinusitis 03/19/2017   Multiple joint pain 03/31/2016   Essential tremor 03/31/2016   Numbness in both hands 03/18/2016   Hypersomnia 11/09/2015   Diabetes mellitus ([place]) 07/18/2015   Abnormal ECG 07/18/2015   Gait difficulty 02/09/2015   Chronic left shoulder pain 02/09/2015   Generalized anxiety disorder 02/09/2015   Numbness 02/09/2015   Insomnia 02/09/2015   Urinary hesitancy 02/09/2015   Other fatigue 02/09/2015   Fibromyalgia 02/09/2015   Essential hypertension 09/01/2011   Coronary artery disease involving native coronary artery of native heart without angina pectoris 09/01/2011   MS (multiple sclerosis) ([employer]) 09/01/2011   Parkinson's disease 09/01/2011   Type 2 diabetes mellitus without complications ([place]) 0000000   Chronic pain 09/01/2011   Past Medical History:  Diagnosis Date   Abnormal ECG 07/18/2015   Arthritis    Asthma    Bowel obstruction ([place])    Depression with anxiety 02/09/2015   mild xanax prn   Diabetes mellitus ([place]) 07/18/2015   type 2   Diverticulitis    Dysrhythmia    abnormal  ekgs worked up   Essential tremor 03/31/2016   Fibromyalgia    Gait difficulty 02/09/2015   Gallstones    GERD (gastroesophageal reflux disease)    HLD (hyperlipidemia)    Hypersomnia 11/09/2015   Hypertension    no meds   Hyperthyroidism    patient states has no problems at present-01/25/2019   Hypothyroidism    patient states no problems at present 01/25/2019   IBS (irritable bowel syndrome)    Insomnia 02/09/2015   Migraines    MS (multiple sclerosis) ([place])    Multiple joint pain 03/31/2016   Numbness in both hands  03/18/2016   Other fatigue 02/09/2015   Peptic ulcer    Pneumonia    PONV (postoperative nausea and vomiting)    Urinary hesitancy 02/09/2015   Vision abnormalities     Family History  Problem Relation Age of Onset   Heart disease Mother    Kidney disease Mother    Diabetes Mother    Other Father 39       murdered   Multiple sclerosis Daughter    Multiple sclerosis Other    Diabetes Sister    Alcohol abuse Brother        ETOH and marijuana   Diabetes Brother    Diabetes Sister    Diabetes Brother    ALS Brother     Past Surgical History:  Procedure Laterality Date   ABDOMINAL HYSTERECTOMY  1976   including oophorectomy, due to uterine cancer   APPENDECTOMY  1974   BIOPSY  01/31/2019   Procedure: BIOPSY;  Surgeon: Mauri Pole, MD;  Location: [facility];  Service: Endoscopy;;   CARPAL TUNNEL RELEASE Left    carpal tunnel surgery   CHOLECYSTECTOMY     2006   COLON SURGERY     SBO x 2   COLONOSCOPY WITH PROPOFOL N/A 01/31/2019   Procedure: COLONOSCOPY WITH PROPOFOL;  Surgeon: Mauri Pole, MD;  Location: [facility];  Service: Endoscopy;  Laterality: N/A;   EUS N/A 04/09/2017   Procedure: UPPER ENDOSCOPIC ULTRASOUND (EUS) LINEAR;  Surgeon: Milus Banister, MD;  Location: [facility];  Service: Endoscopy;  Laterality: N/A;   HERNIA REPAIR     MENISCUS REPAIR Left    POLYPECTOMY  01/31/2019   Procedure: POLYPECTOMY;  Surgeon: Mauri Pole, MD;  Location: [facility];  Service: Endoscopy;;   SMALL INTESTINE SURGERY     toenail removal Right 12/2017   great toenail    Social History   Occupational History   Occupation: disabled  Tobacco Use   Smoking status: Former    Packs/day: 0.25    Years: 5.00    Total pack years: 1.25    Types: Cigarettes    Quit date: 1990    Years since quitting: 34.1    Passive exposure: Never   Smokeless tobacco: Never  Vaping Use   Vaping Use: Never used  Substance and Sexual Activity   Alcohol use: Yes    Comment: rarely   Drug use: No   Sexual activity: Yes

## 2022-04-23 ENCOUNTER — Encounter (HOSPITAL_COMMUNITY): Payer: Self-pay | Admitting: Gastroenterology

## 2022-04-23 DIAGNOSIS — Z79899 Other long term (current) drug therapy: Secondary | ICD-10-CM | POA: Diagnosis not present

## 2022-04-23 NOTE — Anesthesia Preprocedure Evaluation (Addendum)
Anesthesia Evaluation  Patient identified by MRN, date of birth, ID band Patient awake    Reviewed: Allergy & Precautions, NPO status , Patient's Chart, lab work & pertinent test results  History of Anesthesia Complications (+) PONV and history of anesthetic complications  Airway Mallampati: II  TM Distance: >3 FB Neck ROM: Full    Dental no notable dental hx.    Pulmonary asthma , former smoker   Pulmonary exam normal        Cardiovascular hypertension, + CAD  Normal cardiovascular exam     Neuro/Psych  Headaches  Anxiety Depression    Multiple sclerosis    GI/Hepatic Neg liver ROS, PUD,GERD  Medicated,,Hx of adenomatous colonic polyps, GERD, Chronic constipation   Endo/Other  diabetes (on Ozempic), Type 2, Oral Hypoglycemic Agents, Insulin DependentHypothyroidism    Renal/GU Renal InsufficiencyRenal disease  negative genitourinary   Musculoskeletal  (+) Arthritis ,  Fibromyalgia -  Abdominal   Peds  Hematology negative hematology ROS (+)   Anesthesia Other Findings Day of surgery medications reviewed with patient.  Reproductive/Obstetrics negative OB ROS                              Anesthesia Physical Anesthesia Plan  ASA: 2  Anesthesia Plan: MAC   Post-op Pain Management: Minimal or no pain anticipated   Induction:   PONV Risk Score and Plan: 3 and Treatment may vary due to age or medical condition and Propofol infusion  Airway Management Planned: Natural Airway and Simple Face Mask  Additional Equipment: None  Intra-op Plan:   Post-operative Plan:   Informed Consent: I have reviewed the patients History and Physical, chart, labs and discussed the procedure including the risks, benefits and alternatives for the proposed anesthesia with the patient or authorized representative who has indicated his/her understanding and acceptance.       Plan Discussed with:  CRNA  Anesthesia Plan Comments:         Anesthesia Quick Evaluation

## 2022-04-23 NOTE — H&P (Signed)
[facility] History and Physical   Primary Care Physician:  Camillia Herter, NP   Reason for Procedure:  History of adenomatous colon polyps  Plan:    Surveillance colonoscopy with possible interventions as needed     HPI: Courtney [name] is a very pleasant 67 y.o. female here for surveillance colonoscopy. Denies any nausea, vomiting, abdominal pain, melena or bright red blood per rectum  The risks and benefits as well as alternatives of endoscopic procedure(s) have been discussed and reviewed. All questions answered. The patient agrees to proceed.    Past Medical History:  Diagnosis Date   Abnormal ECG 07/18/2015   Arthritis    Asthma    Bowel obstruction (HCC)    Depression with anxiety 02/09/2015   mild xanax prn   Diabetes mellitus ([place]) 07/18/2015   type 2   Diverticulitis    Dysrhythmia    abnormal ekgs worked up   Essential tremor 03/31/2016   Fibromyalgia    Gait difficulty 02/09/2015   Gallstones    GERD (gastroesophageal reflux disease)    HLD (hyperlipidemia)    Hypersomnia 11/09/2015   Hypertension    no meds   Hyperthyroidism    patient states has no problems at present-01/25/2019   Hypothyroidism    patient states no problems at present 01/25/2019   IBS (irritable bowel syndrome)    Insomnia 02/09/2015   Migraines    MS (multiple sclerosis) ([place])    Multiple joint pain 03/31/2016   Numbness in both hands 03/18/2016   Other fatigue 02/09/2015   Peptic ulcer    Pneumonia    PONV (postoperative nausea and vomiting)    Urinary hesitancy 02/09/2015   Vision abnormalities     Past Surgical History:  Procedure Laterality Date   ABDOMINAL HYSTERECTOMY  1976   including oophorectomy, due to uterine cancer   APPENDECTOMY  1974   BIOPSY  01/31/2019   Procedure: BIOPSY;  Surgeon: Mauri Pole, MD;  Location: [facility];  Service: Endoscopy;;   CARPAL TUNNEL RELEASE Left    carpal tunnel surgery   CHOLECYSTECTOMY     2006   COLON  SURGERY     SBO x 2   COLONOSCOPY WITH PROPOFOL N/A 01/31/2019   Procedure: COLONOSCOPY WITH PROPOFOL;  Surgeon: Mauri Pole, MD;  Location: [facility];  Service: Endoscopy;  Laterality: N/A;   EUS N/A 04/09/2017   Procedure: UPPER ENDOSCOPIC ULTRASOUND (EUS) LINEAR;  Surgeon: Milus Banister, MD;  Location: [facility];  Service: Endoscopy;  Laterality: N/A;   HERNIA REPAIR     MENISCUS REPAIR Left    POLYPECTOMY  01/31/2019   Procedure: POLYPECTOMY;  Surgeon: Mauri Pole, MD;  Location: [facility];  Service: Endoscopy;;   SMALL INTESTINE SURGERY     toenail removal Right 12/2017   great toenail     Prior to Admission medications   Medication Sig Start Date End Date Taking? Authorizing Provider  acetaminophen (TYLENOL) 500 MG tablet Take 1,000 mg by mouth every 8 (eight) hours as needed for headache.   Yes [provider]  ALPRAZolam Duanne Moron) 1 MG tablet Take 0.5-1 mg by mouth 3 (three) times daily as needed for anxiety.   Yes [provider]  cyclobenzaprine (FLEXERIL) 10 MG tablet Take 10 mg by mouth 3 (three) times daily as needed for muscle spasms. 02/19/22  Yes [provider]  Estradiol 10 MCG TABS vaginal tablet Place 1 tablet (10 mcg total) vaginally at bedtime for 14 days,  THEN 1 tablet (10 mcg total) 2 (two) times a week. Patient taking differently: 1 tablet (10 mcg total) 2 (two) times a week as needed for vaginal dryness  03/18/22 04/01/23 Yes Inez Catalina, MD  insulin aspart protamine - aspart (NOVOLOG 70/30 MIX) (70-30) 100 UNIT/ML FlexPen Inject 20 Units into the skin daily with breakfast AND 22 Units daily with supper. 12/06/21  Yes Shamleffer, Melanie Crazier, MD  JARDIANCE 25 MG TABS tablet TAKE 1 TABLET DAILY 01/27/22  Yes Shamleffer, Melanie Crazier, MD  latanoprost (XALATAN) 0.005 % ophthalmic solution Place 1 drop into both eyes at bedtime. 02/06/22  Yes [provider]  loratadine (CLARITIN) 10 MG tablet Take  10 mg by mouth daily as needed for allergies (seasonal). 01/05/20  Yes [provider]  melatonin 5 MG TABS Take 10 mg by mouth at bedtime as needed (sleep).   Yes [provider]  modafinil (PROVIGIL) 100 MG tablet Take 25 mg by mouth daily as needed (Takes to help stay awake due to insomnia, takes at 6a if needed). 02/19/22  Yes [provider]  Multiple Vitamins-Minerals (MULTIVITAMIN ADULT) CHEW Chew 2 each by mouth 2 (two) times a week.   Yes [provider]  naloxone (NARCAN) nasal spray 4 mg/0.1 mL Place 1 spray into the nose daily as needed (overdose).   Yes [provider]  oxyCODONE (ROXICODONE) 15 MG immediate release tablet Take 15 mg by mouth 5 (five) times daily as needed for pain. 08/02/21  Yes [provider]  pantoprazole (PROTONIX) 40 MG tablet Take 1 tablet (40 mg total) by mouth daily. 01/29/22  Yes Esterwood, Amy S, PA-C  Semaglutide,0.25 or 0.5MG/DOS, 2 MG/3ML SOPN Inject 0.5 mg into the skin once a week. Patient taking differently: Inject 0.5 mg into the skin every Sunday. 04/08/22  Yes Shamleffer, Melanie Crazier, MD  Continuous Blood Gluc Sensor (DEXCOM G7 SENSOR) MISC 1 Device by Does not apply route as directed. 04/08/22   Shamleffer, Melanie Crazier, MD  FREESTYLE LITE test strip USE 1 STRIP IN THE MORNING AND AT BEDTIME AS INSTRUCTED 02/06/22   Shamleffer, Melanie Crazier, MD  Insulin Pen Needle 32G X 4 MM MISC 1 Device by Does not apply route in the morning and at bedtime. 01/28/21   Shamleffer, Melanie Crazier, MD  Lancets (FREESTYLE) lancets 1 Device by Misc.(Non-Drug; Combo Route) route daily. USED TO CHECK BLOOD SUGAR TID 01/05/20   [provider]  ondansetron (ZOFRAN) 4 MG tablet Take 1 tablet (4 mg total) by mouth every 6 (six) hours as needed for nausea or vomiting. Prior to colonoscopy prep 01/22/22   Esterwood, Amy S, PA-C  terconazole (TERAZOL 3) 0.8 % vaginal cream Place 1 applicator vaginally at  bedtime. Patient not taking: Reported on 04/22/2022 12/19/21   Camillia Herter, NP  sucralfate (CARAFATE) 1 g tablet Take 1 tablet (1 g total) by mouth 4 (four) times daily -  before meals and at bedtime. Dissolve 1 tablet in 2-4 oz of water, drink as slurry 08/10/19 02/29/20  Mauri Pole, MD    No current facility-administered medications for this encounter.   Current Outpatient Medications  Medication Sig Dispense Refill   acetaminophen (TYLENOL) 500 MG tablet Take 1,000 mg by mouth every 8 (eight) hours as needed for headache.     ALPRAZolam (XANAX) 1 MG tablet Take 0.5-1 mg by mouth 3 (three) times daily as needed for anxiety.     cyclobenzaprine (FLEXERIL) 10 MG tablet Take 10 mg by mouth  3 (three) times daily as needed for muscle spasms.     Estradiol 10 MCG TABS vaginal tablet Place 1 tablet (10 mcg total) vaginally at bedtime for 14 days, THEN 1 tablet (10 mcg total) 2 (two) times a week. (Patient taking differently: 1 tablet (10 mcg total) 2 (two) times a week as needed for vaginal dryness ) 30 tablet 3   insulin aspart protamine - aspart (NOVOLOG 70/30 MIX) (70-30) 100 UNIT/ML FlexPen Inject 20 Units into the skin daily with breakfast AND 22 Units daily with supper. 30 mL 3   JARDIANCE 25 MG TABS tablet TAKE 1 TABLET DAILY 90 tablet 3   latanoprost (XALATAN) 0.005 % ophthalmic solution Place 1 drop into both eyes at bedtime.     loratadine (CLARITIN) 10 MG tablet Take 10 mg by mouth daily as needed for allergies (seasonal).     melatonin 5 MG TABS Take 10 mg by mouth at bedtime as needed (sleep).     modafinil (PROVIGIL) 100 MG tablet Take 25 mg by mouth daily as needed (Takes to help stay awake due to insomnia, takes at 6a if needed).     Multiple Vitamins-Minerals (MULTIVITAMIN ADULT) CHEW Chew 2 each by mouth 2 (two) times a week.     naloxone (NARCAN) nasal spray 4 mg/0.1 mL Place 1 spray into the nose daily as needed (overdose).     oxyCODONE (ROXICODONE) 15 MG immediate  release tablet Take 15 mg by mouth 5 (five) times daily as needed for pain.     pantoprazole (PROTONIX) 40 MG tablet Take 1 tablet (40 mg total) by mouth daily. 90 tablet 3   Semaglutide,0.25 or 0.5MG/DOS, 2 MG/3ML SOPN Inject 0.5 mg into the skin once a week. (Patient taking differently: Inject 0.5 mg into the skin every Sunday.) 9 mL 3   Continuous Blood Gluc Sensor (DEXCOM G7 SENSOR) MISC 1 Device by Does not apply route as directed. 9 each 3   FREESTYLE LITE test strip USE 1 STRIP IN THE MORNING AND AT BEDTIME AS INSTRUCTED 200 strip 3   Insulin Pen Needle 32G X 4 MM MISC 1 Device by Does not apply route in the morning and at bedtime. 200 each 3   Lancets (FREESTYLE) lancets 1 Device by Misc.(Non-Drug; Combo Route) route daily. USED TO CHECK BLOOD SUGAR TID     ondansetron (ZOFRAN) 4 MG tablet Take 1 tablet (4 mg total) by mouth every 6 (six) hours as needed for nausea or vomiting. Prior to colonoscopy prep 10 tablet 0   terconazole (TERAZOL 3) 0.8 % vaginal cream Place 1 applicator vaginally at bedtime. (Patient not taking: Reported on 04/22/2022) 60 g 2    Allergies as of 01/22/2022 - Review Complete 01/22/2022  Allergen Reaction Noted   Ibuprofen Other (See Comments) 10/20/2015   Methylprednisolone Anaphylaxis, Shortness Of Breath, Palpitations, and Other (See Comments) 02/03/2015   Prednisone Anaphylaxis, Shortness Of Breath, Palpitations, and Other (See Comments) 02/03/2015   Bactrim [sulfamethoxazole-trimethoprim] Other (See Comments) 02/03/2015   Biaxin [clarithromycin] Swelling 02/03/2015   Naproxen Other (See Comments) 10/20/2015   Sulfa antibiotics Other (See Comments) and Swelling 03/03/2012    Family History  Problem Relation Age of Onset   Heart disease Mother    Kidney disease Mother    Diabetes Mother    Other Father 38       murdered   Multiple sclerosis Daughter    Multiple sclerosis Other    Diabetes Sister    Alcohol abuse Brother  ETOH and marijuana    Diabetes Brother    Diabetes Sister    Diabetes Brother    ALS Brother     Social History   Socioeconomic History   Marital status: Married    Spouse name: Jimmie   Number of children: 4   Years of education: Not on file   Highest education level: 12th grade  Occupational History   Occupation: disabled  Tobacco Use   Smoking status: Former    Packs/day: 0.25    Years: 5.00    Total pack years: 1.25    Types: Cigarettes    Quit date: 1990    Years since quitting: 34.1    Passive exposure: Never   Smokeless tobacco: Never  Vaping Use   Vaping Use: Never used  Substance and Sexual Activity   Alcohol use: Yes    Comment: rarely   Drug use: No   Sexual activity: Yes  Other Topics Concern   Not on file  Social History Narrative   Pt is right-handed. She lives with her husband in a 2 story house. Pt avoids caffeine. She and her husband go to the gym, lately once weekly, previously 3 times a week.    Social Determinants of Health   Financial Resource Strain: Not on file  Food Insecurity: Not on file  Transportation Needs: Not on file  Physical Activity: Not on file  Stress: Not on file  Social Connections: Not on file  Intimate Partner Violence: Not on file    Review of Systems:  All other review of systems negative except as mentioned in the HPI.  Physical Exam: Vital signs in last 24 hours: There were no vitals taken for this visit. General:   Alert, NAD Lungs:  Clear .   Heart:  Regular rate and rhythm Abdomen:  Soft, nontender and nondistended. Neuro/Psych:  Alert and cooperative. Normal mood and affect. A and O x 3  Reviewed labs, radiology imaging, old records and pertinent past GI work up     K. Denzil Magnuson , MD [phone]

## 2022-04-24 ENCOUNTER — Encounter (HOSPITAL_COMMUNITY)
Admission: RE | Disposition: A | Discharge status: Home / Self Care | Payer: Self-pay | Source: Ambulatory Visit | Attending: Gastroenterology

## 2022-04-24 ENCOUNTER — Ambulatory Visit (HOSPITAL_COMMUNITY)
Admission: RE | Admit: 2022-04-24 | Discharge: 2022-04-24 | Disposition: A | Discharge status: Home / Self Care | Payer: Medicare Other | Source: Ambulatory Visit | Attending: Gastroenterology | Admitting: Gastroenterology

## 2022-04-24 ENCOUNTER — Other Ambulatory Visit: Payer: Self-pay

## 2022-04-24 ENCOUNTER — Encounter (HOSPITAL_COMMUNITY): Payer: Self-pay | Admitting: Gastroenterology

## 2022-04-24 ENCOUNTER — Ambulatory Visit (HOSPITAL_COMMUNITY): Payer: Medicare Other | Admitting: Anesthesiology

## 2022-04-24 ENCOUNTER — Ambulatory Visit (HOSPITAL_BASED_OUTPATIENT_CLINIC_OR_DEPARTMENT_OTHER): Payer: Medicare Other | Admitting: Anesthesiology

## 2022-04-24 DIAGNOSIS — Z09 Encounter for follow-up examination after completed treatment for conditions other than malignant neoplasm: Secondary | ICD-10-CM | POA: Diagnosis not present

## 2022-04-24 DIAGNOSIS — Z860101 Personal history of adenomatous and serrated colon polyps: Secondary | ICD-10-CM

## 2022-04-24 DIAGNOSIS — Z8711 Personal history of peptic ulcer disease: Secondary | ICD-10-CM | POA: Insufficient documentation

## 2022-04-24 DIAGNOSIS — I10 Essential (primary) hypertension: Secondary | ICD-10-CM | POA: Insufficient documentation

## 2022-04-24 DIAGNOSIS — Z86010 Personal history of colonic polyps: Secondary | ICD-10-CM

## 2022-04-24 DIAGNOSIS — I2510 Atherosclerotic heart disease of native coronary artery without angina pectoris: Secondary | ICD-10-CM | POA: Insufficient documentation

## 2022-04-24 DIAGNOSIS — Z87891 Personal history of nicotine dependence: Secondary | ICD-10-CM | POA: Insufficient documentation

## 2022-04-24 DIAGNOSIS — E119 Type 2 diabetes mellitus without complications: Secondary | ICD-10-CM | POA: Diagnosis not present

## 2022-04-24 DIAGNOSIS — Z1211 Encounter for screening for malignant neoplasm of colon: Secondary | ICD-10-CM | POA: Insufficient documentation

## 2022-04-24 DIAGNOSIS — D125 Benign neoplasm of sigmoid colon: Secondary | ICD-10-CM | POA: Insufficient documentation

## 2022-04-24 DIAGNOSIS — Z7984 Long term (current) use of oral hypoglycemic drugs: Secondary | ICD-10-CM | POA: Diagnosis not present

## 2022-04-24 DIAGNOSIS — F418 Other specified anxiety disorders: Secondary | ICD-10-CM | POA: Diagnosis not present

## 2022-04-24 DIAGNOSIS — D128 Benign neoplasm of rectum: Secondary | ICD-10-CM | POA: Insufficient documentation

## 2022-04-24 DIAGNOSIS — K648 Other hemorrhoids: Secondary | ICD-10-CM | POA: Diagnosis not present

## 2022-04-24 DIAGNOSIS — K219 Gastro-esophageal reflux disease without esophagitis: Secondary | ICD-10-CM | POA: Insufficient documentation

## 2022-04-24 DIAGNOSIS — K621 Rectal polyp: Secondary | ICD-10-CM

## 2022-04-24 DIAGNOSIS — K5909 Other constipation: Secondary | ICD-10-CM

## 2022-04-24 HISTORY — PX: COLONOSCOPY WITH PROPOFOL: SHX5780

## 2022-04-24 HISTORY — PX: POLYPECTOMY: SHX5525

## 2022-04-24 LAB — GLUCOSE, CAPILLARY: Glucose-Capillary: 138 mg/dL — ABNORMAL HIGH (ref 70–99)

## 2022-04-24 SURGERY — COLONOSCOPY WITH PROPOFOL
Anesthesia: Monitor Anesthesia Care

## 2022-04-24 MED ORDER — ACETAMINOPHEN 500 MG PO TABS: 1000.0000 mg | ORAL_TABLET | ORAL | Status: DC

## 2022-04-24 MED ORDER — PROPOFOL 1000 MG/100ML IV EMUL
INTRAVENOUS | Status: AC
  Filled 2022-04-24: qty 100

## 2022-04-24 MED ORDER — CEFAZOLIN SODIUM-DEXTROSE 2-4 GM/100ML-% IV SOLN
2.0000 g | INTRAVENOUS | Status: DC
  Filled 2022-04-24: qty 100

## 2022-04-24 MED ORDER — PHENYLEPHRINE 80 MCG/ML (10ML) SYRINGE FOR IV PUSH (FOR BLOOD PRESSURE SUPPORT)
PREFILLED_SYRINGE | INTRAVENOUS | Status: DC | PRN
  Administered 2022-04-24: 160 ug via INTRAVENOUS

## 2022-04-24 MED ORDER — PROPOFOL 10 MG/ML IV BOLUS
INTRAVENOUS | Status: DC | PRN
  Administered 2022-04-24: 20 mg via INTRAVENOUS
  Administered 2022-04-24: 40 mg via INTRAVENOUS

## 2022-04-24 MED ORDER — SODIUM CHLORIDE 0.9 % IV SOLN: INTRAVENOUS | Status: DC

## 2022-04-24 MED ORDER — LIDOCAINE 2% (20 MG/ML) 5 ML SYRINGE: INTRAMUSCULAR | Status: DC | PRN

## 2022-04-24 MED ORDER — LIDOCAINE 2% (20 MG/ML) 5 ML SYRINGE
INTRAMUSCULAR | Status: DC | PRN
  Administered 2022-04-24: 100 mg via INTRAVENOUS

## 2022-04-24 MED ORDER — LACTATED RINGERS IV SOLN: INTRAVENOUS | Status: DC

## 2022-04-24 MED ORDER — PROPOFOL 500 MG/50ML IV EMUL
INTRAVENOUS | Status: DC | PRN
  Administered 2022-04-24: 100 ug/kg/min via INTRAVENOUS

## 2022-04-24 SURGICAL SUPPLY — 22 items

## 2022-04-24 NOTE — Transfer of Care (Signed)
Immediate Anesthesia Transfer of Care Note  Patient: Courtney [name]  Procedure(s) Performed: COLONOSCOPY WITH PROPOFOL  Patient Location: Endoscopy Unit  Anesthesia Type:MAC  Level of Consciousness: awake and alert   Airway & Oxygen Therapy: Patient Spontanous Breathing and Patient connected to face mask oxygen  Post-op Assessment: Report given to RN and Post -op Vital signs reviewed and stable  Post vital signs: Reviewed and stable  Last Vitals:  Vitals Value Taken Time  BP    Temp    Pulse    Resp    SpO2      Last Pain:  Vitals:   04/24/22 0652  TempSrc: Temporal  PainSc: 10-Worst pain ever         Complications: No notable events documented.

## 2022-04-24 NOTE — Discharge Instructions (Signed)

## 2022-04-24 NOTE — Anesthesia Postprocedure Evaluation (Signed)
Anesthesia Post Note  Patient: Courtney [name]  Procedure(s) Performed: COLONOSCOPY WITH PROPOFOL     Patient location during evaluation: PACU Anesthesia Type: MAC Level of consciousness: awake and alert Pain management: pain level controlled Vital Signs Assessment: post-procedure vital signs reviewed and stable Respiratory status: spontaneous breathing, nonlabored ventilation and respiratory function stable Cardiovascular status: blood pressure returned to baseline Postop Assessment: no apparent nausea or vomiting Anesthetic complications: no   No notable events documented.  Last Vitals:  Vitals:   04/24/22 0830 04/24/22 0840  BP: 118/81 120/75  Pulse: 88 75  Resp: 15 16  Temp:    SpO2: 100% 100%    Last Pain:  Vitals:   04/24/22 0830  TempSrc:   PainSc: [place]

## 2022-04-24 NOTE — Op Note (Signed)
[facility] Patient Name: Courtney [name] Procedure Date: 04/24/2022 MRN: [id number] Attending MD: Mauri Pole , MD, [id number] Date of Birth: May 02, 1955 CSN: [id number] Age: 67 Admit Type: Outpatient Procedure:                Colonoscopy Indications:              High risk colon cancer surveillance: Personal                            history of adenoma (10 mm or greater in size), High                            risk colon cancer surveillance: Personal history of                            multiple (3 or more) adenomas Providers:                Mauri Pole, MD, Altamese Cabal, RN, Darliss Cheney, Technician Referring MD:              Medicines:                Monitored Anesthesia Care Complications:            No immediate complications. Estimated Blood Loss:     Estimated blood loss was minimal. Procedure:                Pre-Anesthesia Assessment:                           - Prior to the procedure, a History and Physical                            was performed, and patient medications and                            allergies were reviewed. [name] tolerance of                            previous anesthesia was also reviewed. The risks                            and benefits of the procedure and the sedation                            options and risks were discussed with the patient.                            All questions were answered, and informed consent                            was obtained. Prior Anticoagulants: The patient has  taken no anticoagulant or antiplatelet agents. ASA                            Grade Assessment: III - A patient with severe                            systemic disease. After reviewing the risks and                            benefits, the patient was deemed in satisfactory                            condition to undergo the procedure.                            After obtaining informed consent, the colonoscope                            was passed under direct vision. Throughout the                            procedure, the patient's blood pressure, pulse, and                            oxygen saturations were monitored continuously. The                            [id number] [id number] Olympus colonoscope was                            introduced through the anus and advanced to the the                            cecum, identified by appendiceal orifice and                            ileocecal valve. The colonoscopy was performed                            without difficulty. The patient tolerated the                            procedure well. The quality of the bowel                            preparation was good. The ileocecal valve,                            appendiceal orifice, and rectum were photographed. Scope In: 7:49:33 AM Scope Out: 8:09:13 AM Scope Withdrawal Time: 0 hours 14 minutes 3 seconds  Total Procedure Duration: 0 hours 19 minutes 40 seconds  Findings:      The perianal and digital rectal examinations were normal.      Two sessile polyps were found in the rectum and sigmoid  colon. The       polyps were 3 to 4 mm in size. These polyps were removed with a cold       snare. Resection and retrieval were complete.      Non-bleeding external and internal hemorrhoids were found during       retroflexion. The hemorrhoids were medium-sized.      The exam was otherwise without abnormality. Impression:               - Two 3 to 4 mm polyps in the rectum and in the                            sigmoid colon, removed with a cold snare. Resected                            and retrieved.                           - Non-bleeding external and internal hemorrhoids.                           - The examination was otherwise normal. Moderate Sedation:      N/A Recommendation:           - Resume previous diet.                           -  Continue present medications.                           - Await pathology results.                           - Repeat colonoscopy in 5-10 years for surveillance                            based on pathology results. Procedure Code(s):        --- Professional ---                           [phone], Colonoscopy, flexible; with removal of                            tumor(s), polyp(s), or other lesion(s) by snare                            technique Diagnosis Code(s):        --- Professional ---                           D12.8, Benign neoplasm of rectum                           D12.5, Benign neoplasm of sigmoid colon                           K64.8, Other hemorrhoids  Z86.010, Personal history of colonic polyps CPT copyright 2022 [employer]. All rights reserved. The codes documented in this report are preliminary and upon coder review may  be revised to meet current compliance requirements. Mauri Pole, MD 04/24/2022 8:27:29 AM This report has been signed electronically. Number of Addenda: 0

## 2022-04-25 LAB — SURGICAL PATHOLOGY

## 2022-04-28 ENCOUNTER — Other Ambulatory Visit (HOSPITAL_COMMUNITY): Payer: Self-pay

## 2022-04-28 ENCOUNTER — Encounter (HOSPITAL_COMMUNITY): Payer: Self-pay | Admitting: Gastroenterology

## 2022-04-28 ENCOUNTER — Telehealth: Payer: Self-pay | Admitting: Pharmacy Technician

## 2022-04-28 NOTE — Telephone Encounter (Addendum)
Pharmacy Patient Advocate Encounter   Received notification from Pt advice request msg/CMA/Fax that prior authorization for Ozempic and Dexcom G7 is required/requested.  PA submitted on 04/28/22 to (ins) [employer] via fax [phone] Case ID  [id number] [facility]) [id number] (CGM/Dexcom)

## 2022-04-29 NOTE — Telephone Encounter (Signed)
Pharmacy Patient Advocate Encounter  Prior Authorization for Courtney [name] has been approved by [employer] [facility].    Effective dates:  through 03/02/2098 The coverage end-date could change based on future review of the drug and drug class by the [facility].  Pt may call express scripts to fill.

## 2022-04-29 NOTE — Pre-Procedure Instructions (Signed)
Surgical Instructions    Your procedure is scheduled on May 05, 2022.  Report to [facility] Main Entrance "A" at 5:30 A.M., then check in with the Admitting office.  Call this number if you have problems the morning of surgery:  [phone]  If you have any questions prior to your surgery date call [phone]: Open Monday-Friday 8am-4pm If you experience any cold or flu symptoms such as cough, fever, chills, shortness of breath, etc. between now and your scheduled surgery, please notify us at the above number.     Remember:  Do not eat after midnight the night before your surgery  You may drink clear liquids until 4:30 AM the morning of your surgery.   Clear liquids allowed are: Water, Non-Citrus Juices (without pulp), Carbonated Beverages, Clear Tea, Black Coffee Only (NO MILK, CREAM OR POWDERED CREAMER of any kind), and Gatorade.     Take these medicines the morning of surgery with A SIP OF WATER:  pantoprazole (PROTONIX)    May take these medicines IF NEEDED:  acetaminophen (TYLENOL)   ALPRAZolam (XANAX)   loratadine (CLARITIN)   naloxone (NARCAN) nasal spray   oxyCODONE (ROXICODONE)    As of today, STOP taking any Aspirin (unless otherwise instructed by your surgeon) Aleve, Naproxen, Ibuprofen, Motrin, Advil, Goody's, BC's, all herbal medications, fish oil, and all vitamins.   WHAT DO I DO ABOUT MY DIABETES MEDICATION?   STOP taking your JARDIANCE three days prior to surgery. Your last dose will be February 29th.   THE NIGHT BEFORE SURGERY, only take 14 units  of insulin aspart protamine - aspart (NOVOLOG 70/30 MIX).      THE MORNING OF SURGERY, DO NOT take ANY of your insulin aspart protamine - aspart (NOVOLOG 70/30 MIX).   STOP taking your Semaglutide one week prior to surgery. Your last dose will be February 25th.    HOW TO MANAGE YOUR DIABETES BEFORE AND AFTER SURGERY  Why is it important to control my blood sugar before and after surgery? Improving  blood sugar levels before and after surgery helps healing and can limit problems. A way of improving blood sugar control is eating a healthy diet by:  Eating less sugar and carbohydrates  Increasing activity/exercise  Talking with your doctor about reaching your blood sugar goals High blood sugars (greater than 180 mg/dL) can raise your risk of infections and slow your recovery, so you will need to focus on controlling your diabetes during the weeks before surgery. Make sure that the doctor who takes care of your diabetes knows about your planned surgery including the date and location.  How do I manage my blood sugar before surgery? Check your blood sugar at least 4 times a day, starting 2 days before surgery, to make sure that the level is not too high or low.  Check your blood sugar the morning of your surgery when you wake up and every 2 hours until you get to the Short Stay unit.  If your blood sugar is less than 70 mg/dL, you will need to treat for low blood sugar: Do not take insulin. Treat a low blood sugar (less than 70 mg/dL) with  cup of clear juice (cranberry or apple), 4 glucose tablets, OR glucose gel. Recheck blood sugar in 15 minutes after treatment (to make sure it is greater than 70 mg/dL). If your blood sugar is not greater than 70 mg/dL on recheck, call [phone] for further instructions. Report your blood sugar to the short stay nurse when  you get to Short Stay.  If you are admitted to the hospital after surgery: Your blood sugar will be checked by the staff and you will probably be given insulin after surgery (instead of oral diabetes medicines) to make sure you have good blood sugar levels. The goal for blood sugar control after surgery is 80-180 mg/dL.                      Do NOT Smoke (Tobacco/Vaping) for 24 hours prior to your procedure.  If you use a CPAP at night, you may bring your mask/headgear for your overnight stay.   Contacts, glasses, piercing's,  hearing aid's, dentures or partials may not be worn into surgery, please bring cases for these belongings.    For patients admitted to the hospital, discharge time will be determined by your treatment team.   Patients discharged the day of surgery will not be allowed to drive home, and someone needs to stay with them for 24 hours.  SURGICAL WAITING ROOM VISITATION Patients having surgery or a procedure may have no more than 2 support people in the waiting area - these visitors may rotate.   Children under the age of 11 must have an adult with them who is not the patient. If the patient needs to stay at the hospital during part of their recovery, the visitor guidelines for inpatient rooms apply. Pre-op nurse will coordinate an appropriate time for 1 support person to accompany patient in pre-op.  This support person may not rotate.   Please refer to the [facility] website for the visitor guidelines for Inpatients (after your surgery is over and you are in a regular room).    Special instructions:   [place]- Preparing For Surgery  Before surgery, you can play an important role. Because skin is not sterile, your skin needs to be as free of germs as possible. You can reduce the number of germs on your skin by washing with CHG (chlorahexidine gluconate) Soap before surgery.  CHG is an antiseptic cleaner which kills germs and bonds with the skin to continue killing germs even after washing.    Oral Hygiene is also important to reduce your risk of infection.  Remember - BRUSH YOUR TEETH THE MORNING OF SURGERY WITH YOUR REGULAR TOOTHPASTE  Please do not use if you have an allergy to CHG or antibacterial soaps. If your skin becomes reddened/irritated stop using the CHG.  Do not shave (including legs and underarms) for at least 48 hours prior to first CHG shower. It is OK to shave your face.  Please follow these instructions carefully.   Shower the NIGHT BEFORE SURGERY and the MORNING OF  SURGERY  If you chose to wash your hair, wash your hair first as usual with your normal shampoo.  After you shampoo, rinse your hair and body thoroughly to remove the shampoo.  Use CHG Soap as you would any other liquid soap. You can apply CHG directly to the skin and wash gently with a scrungie or a clean washcloth.   Apply the CHG Soap to your body ONLY FROM THE NECK DOWN.  Do not use on open wounds or open sores. Avoid contact with your eyes, ears, mouth and genitals (private parts). Wash Face and genitals (private parts)  with your normal soap.   Wash thoroughly, paying special attention to the area where your surgery will be performed.  Thoroughly rinse your body with warm water from the neck down.  DO  NOT shower/wash with your normal soap after using and rinsing off the CHG Soap.  Pat yourself dry with a CLEAN TOWEL.  Wear CLEAN PAJAMAS to bed the night before surgery  Place CLEAN SHEETS on your bed the night before your surgery  DO NOT SLEEP WITH PETS.   Day of Surgery: Take a shower with CHG soap. Do not wear jewelry or makeup Do not wear lotions, powders, perfumes/colognes, or deodorant. Do not shave 48 hours prior to surgery.  Men may shave face and neck. Do not bring valuables to the hospital.  [facility] is not responsible for any belongings or valuables. Do not wear nail polish, gel polish, artificial nails, or any other type of covering on natural nails (fingers and toes) If you have artificial nails or gel coating that need to be removed by a nail salon, please have this removed prior to surgery. Artificial nails or gel coating may interfere with anesthesia's ability to adequately monitor your vital signs.  Wear Clean/Comfortable clothing the morning of surgery Remember to brush your teeth WITH YOUR REGULAR TOOTHPASTE.   Please read over the following fact sheets that you were given.    If you received a COVID test during your pre-op visit  it is requested  that you wear a mask when out in public, stay away from anyone that may not be feeling well and notify your surgeon if you develop symptoms. If you have been in contact with anyone that has tested positive in the last 10 days please notify you surgeon.

## 2022-04-30 ENCOUNTER — Other Ambulatory Visit (HOSPITAL_COMMUNITY): Payer: Self-pay

## 2022-04-30 ENCOUNTER — Telehealth: Payer: Self-pay | Admitting: Physical Medicine and Rehabilitation

## 2022-04-30 ENCOUNTER — Encounter (HOSPITAL_COMMUNITY): Payer: Self-pay

## 2022-04-30 ENCOUNTER — Other Ambulatory Visit: Payer: Self-pay

## 2022-04-30 ENCOUNTER — Ambulatory Visit: Payer: Medicare Other | Admitting: Physical Medicine and Rehabilitation

## 2022-04-30 ENCOUNTER — Ambulatory Visit: Payer: Self-pay | Admitting: *Deleted

## 2022-04-30 ENCOUNTER — Ambulatory Visit: Payer: Self-pay | Admitting: Surgery

## 2022-04-30 ENCOUNTER — Encounter (HOSPITAL_COMMUNITY)
Admission: RE | Admit: 2022-04-30 | Discharge: 2022-04-30 | Disposition: A | Discharge status: Home / Self Care | Payer: Medicare Other | Source: Ambulatory Visit | Attending: Surgery | Admitting: Surgery

## 2022-04-30 VITALS — BP 155/82 | HR 84 | Temp 97.6°F | Resp 16 | Ht 62.0 in | Wt 162.6 lb

## 2022-04-30 DIAGNOSIS — G35 Multiple sclerosis: Secondary | ICD-10-CM | POA: Insufficient documentation

## 2022-04-30 DIAGNOSIS — K219 Gastro-esophageal reflux disease without esophagitis: Secondary | ICD-10-CM | POA: Insufficient documentation

## 2022-04-30 DIAGNOSIS — K589 Irritable bowel syndrome without diarrhea: Secondary | ICD-10-CM | POA: Insufficient documentation

## 2022-04-30 DIAGNOSIS — Z9049 Acquired absence of other specified parts of digestive tract: Secondary | ICD-10-CM | POA: Diagnosis not present

## 2022-04-30 DIAGNOSIS — E785 Hyperlipidemia, unspecified: Secondary | ICD-10-CM | POA: Diagnosis not present

## 2022-04-30 DIAGNOSIS — M797 Fibromyalgia: Secondary | ICD-10-CM | POA: Diagnosis not present

## 2022-04-30 DIAGNOSIS — E039 Hypothyroidism, unspecified: Secondary | ICD-10-CM | POA: Diagnosis not present

## 2022-04-30 DIAGNOSIS — Z794 Long term (current) use of insulin: Secondary | ICD-10-CM | POA: Diagnosis not present

## 2022-04-30 DIAGNOSIS — Z01818 Encounter for other preprocedural examination: Secondary | ICD-10-CM | POA: Insufficient documentation

## 2022-04-30 DIAGNOSIS — Z87891 Personal history of nicotine dependence: Secondary | ICD-10-CM | POA: Diagnosis not present

## 2022-04-30 DIAGNOSIS — K432 Incisional hernia without obstruction or gangrene: Secondary | ICD-10-CM | POA: Diagnosis not present

## 2022-04-30 DIAGNOSIS — E119 Type 2 diabetes mellitus without complications: Secondary | ICD-10-CM | POA: Diagnosis not present

## 2022-04-30 DIAGNOSIS — G250 Essential tremor: Secondary | ICD-10-CM | POA: Diagnosis not present

## 2022-04-30 DIAGNOSIS — J45909 Unspecified asthma, uncomplicated: Secondary | ICD-10-CM | POA: Insufficient documentation

## 2022-04-30 DIAGNOSIS — I10 Essential (primary) hypertension: Secondary | ICD-10-CM | POA: Insufficient documentation

## 2022-04-30 LAB — CBC
HCT: 43.5 % (ref 36.0–46.0)
Hemoglobin: 14 g/dL (ref 12.0–15.0)
MCH: 28 pg (ref 26.0–34.0)
MCHC: 32.2 g/dL (ref 30.0–36.0)
MCV: 87 fL (ref 80.0–100.0)
Platelets: 182 10*3/uL (ref 150–400)
RBC: 5 MIL/uL (ref 3.87–5.11)
RDW: 12.8 % (ref 11.5–15.5)
WBC: 5.3 10*3/uL (ref 4.0–10.5)
nRBC: 0 % (ref 0.0–0.2)

## 2022-04-30 LAB — BASIC METABOLIC PANEL
Anion gap: 9 (ref 5–15)
BUN: 14 mg/dL (ref 8–23)
CO2: 25 mmol/L (ref 22–32)
Calcium: 9.3 mg/dL (ref 8.9–10.3)
Chloride: 104 mmol/L (ref 98–111)
Creatinine, Ser: 1.26 mg/dL — ABNORMAL HIGH (ref 0.44–1.00)
GFR, Estimated: 47 mL/min — ABNORMAL LOW (ref >60)
Glucose, Bld: 129 mg/dL — ABNORMAL HIGH (ref 70–99)
Potassium: 4.3 mmol/L (ref 3.5–5.1)
Sodium: 138 mmol/L (ref 135–145)

## 2022-04-30 LAB — GLUCOSE, CAPILLARY: Glucose-Capillary: 180 mg/dL — ABNORMAL HIGH (ref 70–99)

## 2022-04-30 NOTE — Progress Notes (Incomplete)
Anesthesia Chart Review:  Case: [id number] Date/Time: 05/05/22 0715   Procedure: LAPAROSCOPIC POSSIBLE OPEN, VENTRAL HERNA REPAIR WITH MESH   Anesthesia type: General   Pre-op diagnosis: INCISIONAL HERNIA   Location: [facility] OR ROOM 01 / [place] OR   Surgeons: Dwan Bolt, MD       DISCUSSION: Patient is a 67 year old female scheduled for the above procedure.   History includes former smoker (quit 03/03/88), post-operative N/V, HTN, HLD, DM2, Multiple Sclerosis, vision abnormalities, essential tremor, fibromyalgia, GERD, IBS, asthma, hypothyroid (elevated thyroglobulin Ab 05/09/11 2013, no recent issue), hernia (s/p repair 1990's), SBO (s/p surgery 2008), appendectomy, cholecystectomy, hysterectomy [id number]), colon polyps (colonoscopy with polypectomy 04/24/22 +tubular adenoma, hyperplastic polyp 04/24/22).  She has a chronically abnormal EKG showing LVH with deep T wave inversions. She has had cardiac caths in 2001 and 2006 and multiple stress test for chest pain evaluations, last in 2015. She has one year follow-up visit with Ambrose Pancoast, NP on 05/02/22.   She had ENT evaluation by Dr. Redmond Baseman on 11/26/21 for chronic sore throat x 3 years and had unremarkable endoscopic evaluation and PPI recommended for suspected laryngopharyngeal reflux. Treated for acute sinusitis 03/11/22 with Augmentin and then Levaquin on 03/18/22. Symptoms improved but not fully resolved. 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 She was prescribed another antibiotic course x 10 days. She completed 04/02/22 and reported recovery of symptoms for > 2 weeks.   DM is managed by Dr. Kelton Pillar. A1c 7.2% 04/08/22. Last Ozempic 04/25/22, and instructed to hold 05/02/22 dose.   She had colonoscopy on 04/24/22. She reported more frequent urination and itching since. She had  increased fluid intake and initially wanted to monitor her symptoms, but agreed to contacting her PCP who did not want to order any additional testing such as UA unless patient made an appointment at their office. PCP office to contact her to schedule a visit if her symptoms persist.    VS: BP (!) 155/82   Pulse 84   Temp 36.4 C   Resp 16   Ht '5\' 2"'$  (1.575 m)   Wt 73.8 kg   SpO2 97%   BMI 29.74 kg/m    PROVIDERS: Camillia Herter, NP is PCP  Larae Grooms, MD is cardiologist Arlice Colt, MD is neurologist [facility], Mammie Lorenzo, MD is endocrinologist Harl Bowie, MD is GI Metta Clines, DO Evaluation 08/03/17. He notes she was diagnosed with MS in 2009 while in [place]; However, per his impression, "I do not think she has multiple sclerosis.  MRI imaging of the brain from 2018 was personally reviewed and looked unremarkable except for minimal punctate hyperintense foci which are nonspecific and may be age-related or associated with history of diabetes or history of headaches.  MRI of cervical spine demonstrate no lesions.  She reports prior CSF analysis was normal.  Ophthalmologic exams have not demonstrated evidence of optic neuritis.  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  Her tremor appears psychogenic as well." Melida Quitter, MD is ENT ([facility]). Evaluation 11/26/21 for chronic sore throat, daily x 3 years. Fiberoptic exam of the pharynx and larynx was "unremarkable". Throat pain possibly due to laryngopharyngeal reflux, and PPI recommended.  Parks Ranger, PA-C is psychiatry provider ([facility])   LABS: Labs reviewed: Acceptable for surgery. A1c 7.2%  04/08/22. TSH 1.10 and FT4 0.95 on 12/05/21.  (all labs ordered are listed, but only abnormal results are displayed)  Labs Reviewed  GLUCOSE, CAPILLARY - Abnormal; Notable for the following components:      Result Value   Glucose-Capillary 180 (*)    All other  components within normal limits  BASIC METABOLIC PANEL - Abnormal; Notable for the following components:   Glucose, Bld 129 (*)    Creatinine, Ser 1.26 (*)    GFR, Estimated 47 (*)    All other components within normal limits  CBC     IMAGES: CT Paranasal Sinuses 03/24/22 ([facility]): IMPRESSION:  1. Previous bilateral maxillary antrostomies, partial  ethmoidectomies, and middle turbinectomies.  2. Widely patent left maxillary sinus and antrostomy. Up to moderate  lobulated right maxillary mucoperiosteal thickening, similar to 2021  MRI and the antrostomy remains patent.  3. Bubbly opacity in the right sphenoid sinus, new since 2021.  4. Cribriform-like nasal septal perforation, with mild rightward  septal deviation.   CT Abd/pelvis 08/14/21: MPRESSION: - No free air, fluid, inflammatory changes, mass or significant lymphadenopathy. Bowel-gas pattern is nonobstructive. Moderate stool burden in the colon. - Small right paramedian ventral hernia with hernial orifice measuring 1.2 cm.  US Thyroid 06/18/21: IMPRESSION: 1. Normal-sized thyroid. 2. Negative for mass, nodule, or adenopathy.   MRI Brain 87/21 ([facility]): IMPRESSION: Normal aging brain.   EKG: 04/30/22: Sinus rhythm with short PR Inferior infarct , age undetermined Anterior infarct , age undetermined Marked ST abnormality, possible lateral subendocardial injury - She has known abnormal EKG with    CV: Nuclear stress test 05/23/13: Normal perfusion. Post-stress LVEF 80%. Probable false positive stress ECG.   Echo 08/25/12: LVEF 55-65. No regional wall motion abnormalities. Mild MR.  Reported normal heart cath in [place], [place] in 2001 and 2006.   Past Medical History:  Diagnosis Date  . Abnormal ECG 07/18/2015  . Arthritis   . 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  . Bowel obstruction ([place])   . Depression with anxiety 02/09/2015    mild xanax prn  . Diabetes mellitus ([place]) 07/18/2015   type 2  . Diverticulitis   . Dysrhythmia    abnormal ekgs worked up  . Essential tremor 03/31/2016  . Fibromyalgia    MS  . Gait difficulty 02/09/2015  . Gallstones   . GERD (gastroesophageal reflux disease)   . HLD (hyperlipidemia)   . Hypersomnia 11/09/2015  . Hypertension    no meds  . Hyperthyroidism    patient states has no problems at present-01/25/2019  . Hypothyroidism    patient states no problems at present 01/25/2019  . IBS (irritable bowel syndrome)   . Insomnia 02/09/2015  . Migraines   . MS (multiple sclerosis) ([place])   . Multiple joint pain 03/31/2016  . Numbness in both hands 03/18/2016  . Other fatigue 02/09/2015  . Peptic ulcer   . Pneumonia    multiple times. Last time 2014  . PONV (postoperative nausea and vomiting)   . Urinary hesitancy 02/09/2015  . Vision abnormalities     Past Surgical History:  Procedure Laterality Date  . ABDOMINAL HYSTERECTOMY  1976   including oophorectomy, due to uterine cancer  . APPENDECTOMY  1974  . BIOPSY  01/31/2019   Procedure: BIOPSY;  Surgeon: Mauri Pole, MD;  Location: [facility];  Service: Endoscopy;;  . CARDIAC CATHETERIZATION     2001/2006  In [place]. Normal per patient  . CARPAL TUNNEL RELEASE Left    carpal tunnel surgery  . CHOLECYSTECTOMY     2006  . COLON SURGERY     SBO x 2  . COLONOSCOPY WITH PROPOFOL N/A 01/31/2019   Procedure: COLONOSCOPY WITH PROPOFOL;  Surgeon: Mauri Pole, MD;  Location: [facility];  Service: Endoscopy;  Laterality: N/A;  . COLONOSCOPY WITH PROPOFOL N/A 04/24/2022   Procedure: COLONOSCOPY WITH PROPOFOL;  Surgeon: Mauri Pole, MD;  Location: [facility];  Service: Gastroenterology;  Laterality: N/A;  . EUS N/A 04/09/2017   Procedure: UPPER ENDOSCOPIC ULTRASOUND (EUS) LINEAR;  Surgeon: Milus Banister, MD;  Location: [facility];  Service: Endoscopy;  Laterality: N/A;  . HERNIA REPAIR    .  MENISCUS REPAIR Left   . POLYPECTOMY  01/31/2019   Procedure: POLYPECTOMY;  Surgeon: Mauri Pole, MD;  Location: [facility];  Service: Endoscopy;;  . POLYPECTOMY  04/24/2022   Procedure: POLYPECTOMY;  Surgeon: Mauri Pole, MD;  Location: [facility];  Service: Gastroenterology;;  . SMALL INTESTINE SURGERY    . toenail removal Right 12/2017   great toenail     MEDICATIONS: . acetaminophen (TYLENOL) 500 MG tablet  . ALPRAZolam (XANAX) 1 MG tablet  . Continuous Blood Gluc Sensor (DEXCOM G7 SENSOR) MISC  . Estradiol 10 MCG TABS vaginal tablet  . FREESTYLE LITE test strip  . insulin aspart protamine - aspart (NOVOLOG 70/30 MIX) (70-30) 100 UNIT/ML FlexPen  . Insulin Pen Needle 32G X 4 MM MISC  . JARDIANCE 25 MG TABS tablet  . Lancets (FREESTYLE) lancets  . latanoprost (XALATAN) 0.005 % ophthalmic solution  . loratadine (CLARITIN) 10 MG tablet  . melatonin 5 MG TABS  . modafinil (PROVIGIL) 100 MG tablet  . Multiple Vitamins-Minerals (MULTIVITAMIN ADULT) CHEW  . naloxone (NARCAN) nasal spray 4 mg/0.1 mL  . ondansetron (ZOFRAN) 4 MG tablet  . oxyCODONE (ROXICODONE) 15 MG immediate release tablet  . pantoprazole (PROTONIX) 40 MG tablet  . Semaglutide,0.25 or 0.'5MG'$ /DOS, 2 MG/3ML SOPN  . terconazole (TERAZOL 3) 0.8 % vaginal cream   No current facility-administered medications for this encounter.

## 2022-04-30 NOTE — Progress Notes (Addendum)
Anesthesia Chart Review:  Case: [id number] Date/Time: 05/05/22 0715   Procedure: LAPAROSCOPIC POSSIBLE OPEN, VENTRAL HERNA REPAIR WITH MESH   Anesthesia type: General   Pre-op diagnosis: INCISIONAL HERNIA   Location: [facility] OR ROOM 01 / [place] [facility]   Surgeons: Dwan Bolt, MD       DISCUSSION: Patient is a 67 year old female scheduled for the above procedure.   History includes former smoker (quit 03/03/88), post-operative N/V, HTN, HLD, DM2, Multiple Sclerosis (2009 in [place]; no radiologist evidence on MRI 2018 & 2021), vision abnormalities, essential tremor, fibromyalgia, GERD, IBS, asthma, hypothyroid (elevated TSH & Thyroglobulin Ab 05/2011, no recent issue), hernia (s/p repair 1990's), SBO (s/p surgery 2008), appendectomy, cholecystectomy, hysterectomy [id number]), colon polyps (colonoscopy with polypectomy 04/24/22 +tubular adenoma, hyperplastic polyp 04/24/22).  She has a chronically abnormal EKG showing LVH with deep T wave inversions. She has had cardiac caths in 2001 and 2006 and multiple stress tests for chest pain evaluations, last in 2015. She has one year follow-up visit with Ambrose Pancoast, NP on 05/02/22.   She had ENT evaluation by Dr. Redmond Baseman on 11/26/21 for chronic sore throat x 3 years and had unremarkable endoscopic evaluation and PPI recommended for suspected laryngopharyngeal reflux. She was treated for acute sinusitis 03/11/22 with Augmentin and then Levaquin on 03/18/22. Symptoms improved but not fully resolved. 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 She was prescribed another antibiotic course of clindamycin x 10 days. She completed 04/02/22 and reported recovery of symptoms for > 2 weeks.   DM is managed by Dr. Kelton Pillar. A1c 7.2% 04/08/22. Last Ozempic 04/25/22, and instructed to hold 05/02/22 dose.   She had colonoscopy  on 04/24/22. She reported more frequent urination and itching since. She had increased fluid intake and initially wanted to monitor her symptoms, but agreed to contacting her PCP who did not want to order any additional testing such as UA unless patient made an appointment at their office. PCP office to let her know to contact their office to scheduled a visit if her symptoms persist.   ADDENDUM 05/02/22 6:36 PM:  Reviewed routine cardiology office visit note from today with Ambrose Pancoast, NP. She was doing well without new cardiac concerns. She was exercising with her stationary bike and walking frequently as weather permitted.  1 year follow-up planned.  Anesthesia team to evaluate on the day of surgery.   VS: BP (!) 155/82   Pulse 84   Temp 36.4 C   Resp 16   Ht '5\' 2"'$  (1.575 m)   Wt 73.8 kg   SpO2 97%   BMI 29.74 kg/m    PROVIDERS: Camillia Herter, NP is PCP  - Larae Grooms, MD is cardiologist - Shamleffer, Mammie Lorenzo, MD is endocrinologist - Harl Bowie, MD is GI - Melida Quitter, MD is ENT ([facility]) - Scarlett Presto is psychiatry provider ([facility]) - Last neurology evaluation see was on 08/03/17 by Metta Clines, DO. He noted she was diagnosed with MS in 2009 while in [place]; However, per his impression, "I do not think she has multiple sclerosis.  MRI imaging of the brain from 2018 was personally reviewed and looked unremarkable except for minimal punctate hyperintense foci which are nonspecific and may be age-related or associated with history of diabetes or history of headaches.  MRI of cervical spine demonstrate no lesions.  She  reports prior CSF analysis was normal.  Ophthalmologic exams have not demonstrated evidence of optic neuritis.  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  Her tremor appears psychogenic as well."   LABS: Labs reviewed: Acceptable for surgery. A1c 7.2% 04/08/22. TSH 1.10 and FT4 0.95 on  12/05/21.  (all labs ordered are listed, but only abnormal results are displayed)  Labs Reviewed  GLUCOSE, CAPILLARY - Abnormal; Notable for the following components:      Result Value   Glucose-Capillary 180 (*)    All other components within normal limits  BASIC METABOLIC PANEL - Abnormal; Notable for the following components:   Glucose, Bld 129 (*)    Creatinine, Ser 1.26 (*)    GFR, Estimated 47 (*)    All other components within normal limits  CBC    IMAGES: CT Paranasal Sinuses 03/24/22 ([facility]): IMPRESSION:  1. Previous bilateral maxillary antrostomies, partial  ethmoidectomies, and middle turbinectomies.  2. Widely patent left maxillary sinus and antrostomy. Up to moderate  lobulated right maxillary mucoperiosteal thickening, similar to 2021  MRI and the antrostomy remains patent.  3. Bubbly opacity in the right sphenoid sinus, new since 2021.  4. Cribriform-like nasal septal perforation, with mild rightward  septal deviation.   CT Abd/pelvis 08/14/21: MPRESSION: - No free air, fluid, inflammatory changes, mass or significant lymphadenopathy. Bowel-gas pattern is nonobstructive. Moderate stool burden in the colon. - Small right paramedian ventral hernia with hernial orifice measuring 1.2 cm.  US Thyroid 06/18/21: IMPRESSION: 1. Normal-sized thyroid. 2. Negative for mass, nodule, or adenopathy.   MRI Brain 10/08/19 ([facility]): IMPRESSION: Normal aging brain.   EKG: 04/30/22: Sinus rhythm with short PR Inferior infarct , age undetermined Anterior infarct , age undetermined Marked ST abnormality, possible lateral subendocardial injury - She has known abnormal EKG. Tracing appears stable when compared to 04/30/21 EKG.     CV: Nuclear stress test 05/23/13: Normal perfusion. Post-stress LVEF 80%. Probable false positive stress ECG.   Echo 08/25/12: LVEF 55-65. No regional wall motion abnormalities. Mild MR.  Reported normal heart cath in [place], [place] in 2001 and  2006.   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   Bowel obstruction (HCC)    Depression with anxiety 02/09/2015   mild xanax prn   Diabetes mellitus ([place]) 07/18/2015   type 2   Diverticulitis    Dysrhythmia    abnormal ekgs worked up   Essential tremor 03/31/2016   Fibromyalgia    MS   Gait difficulty 02/09/2015   Gallstones    GERD (gastroesophageal reflux disease)    HLD (hyperlipidemia)    Hypersomnia 11/09/2015   Hypertension    no meds   Hyperthyroidism    patient states has no problems at present-01/25/2019   Hypothyroidism    patient states no problems at present 01/25/2019   IBS (irritable bowel syndrome)    Insomnia 02/09/2015   Migraines    MS (multiple sclerosis) ([place])    Multiple joint pain 03/31/2016   Numbness in both hands 03/18/2016   Other fatigue 02/09/2015   Peptic ulcer    Pneumonia    multiple times. Last time 2014   PONV (postoperative nausea and vomiting)    Urinary hesitancy 02/09/2015   Vision abnormalities  Past Surgical History:  Procedure Laterality Date   ABDOMINAL HYSTERECTOMY  1976   including oophorectomy, due to uterine cancer   APPENDECTOMY  1974   BIOPSY  01/31/2019   Procedure: BIOPSY;  Surgeon: Mauri Pole, MD;  Location: [facility];  Service: Endoscopy;;   CARDIAC CATHETERIZATION     2001/2006 In [place]. Normal per patient   CARPAL TUNNEL RELEASE Left    carpal tunnel surgery   CHOLECYSTECTOMY     2006   COLON SURGERY     SBO x 2   COLONOSCOPY WITH PROPOFOL N/A 01/31/2019   Procedure: COLONOSCOPY WITH PROPOFOL;  Surgeon: Mauri Pole, MD;  Location: [facility];  Service: Endoscopy;  Laterality: N/A;   COLONOSCOPY WITH PROPOFOL N/A 04/24/2022   Procedure: COLONOSCOPY WITH PROPOFOL;  Surgeon: Mauri Pole, MD;  Location: [facility];   Service: Gastroenterology;  Laterality: N/A;   EUS N/A 04/09/2017   Procedure: UPPER ENDOSCOPIC ULTRASOUND (EUS) LINEAR;  Surgeon: Milus Banister, MD;  Location: [facility];  Service: Endoscopy;  Laterality: N/A;   HERNIA REPAIR     MENISCUS REPAIR Left    POLYPECTOMY  01/31/2019   Procedure: POLYPECTOMY;  Surgeon: Mauri Pole, MD;  Location: [facility];  Service: Endoscopy;;   POLYPECTOMY  04/24/2022   Procedure: POLYPECTOMY;  Surgeon: Mauri Pole, MD;  Location: [facility];  Service: Gastroenterology;;   SMALL INTESTINE SURGERY     toenail removal Right 12/2017   great toenail     MEDICATIONS:  acetaminophen (TYLENOL) 500 MG tablet   ALPRAZolam (XANAX) 1 MG tablet   Continuous Blood Gluc Sensor (DEXCOM G7 SENSOR) MISC   Estradiol 10 MCG TABS vaginal tablet   FREESTYLE LITE test strip   insulin aspart protamine - aspart (NOVOLOG 70/30 MIX) (70-30) 100 UNIT/ML FlexPen   Insulin Pen Needle 32G X 4 MM MISC   JARDIANCE 25 MG TABS tablet   Lancets (FREESTYLE) lancets   latanoprost (XALATAN) 0.005 % ophthalmic solution   loratadine (CLARITIN) 10 MG tablet   melatonin 5 MG TABS   modafinil (PROVIGIL) 100 MG tablet   Multiple Vitamins-Minerals (MULTIVITAMIN ADULT) CHEW   naloxone (NARCAN) nasal spray 4 mg/0.1 mL   ondansetron (ZOFRAN) 4 MG tablet   oxyCODONE (ROXICODONE) 15 MG immediate release tablet   pantoprazole (PROTONIX) 40 MG tablet   Semaglutide,0.25 or 0.'5MG'$ /DOS, 2 MG/3ML SOPN   terconazole (TERAZOL 3) 0.8 % vaginal cream   No current facility-administered medications for this encounter.    Myra Gianotti, PA-C Surgical Short Stay/Anesthesiology [facility] Phone [phone] [facility] Phone [phone] 05/01/2022 9:11 AM

## 2022-04-30 NOTE — Telephone Encounter (Signed)
frequent urination   Per agent : "Lattie Haw, nurse at [facility], stated the patient went in for a pre-op and mentioned frequent urination. Lattie Haw is requesting an order be sent in for a urinalysis, as they cannot order that per protocol, which must come from primary care. Per Lisa's request called pt to advice she needs to schedule an appointment per [facility] office."  Unable to speak with pt. Pt called back and was scheduled for 05/12/2022, first available with PCP.  Seeking clinical advice.   Please call pt directly -[phone]     Chief Complaint: Frequent Urination Symptoms: Frequency urination, "From drinking so much from my colonoscopy, trying to wash all out." Frequency: Friday Pertinent Negatives: Patient denies any associated symptoms Disposition: '[]'$ ED /'[]'$ Urgent Care (no appt availability in office) / '[]'$ Appointment(In office/virtual)/ '[]'$  [facility]/ '[]'$ Home Care/ '[]'$ Refused Recommended Disposition /'[]'$ [facility]/ '[x]'$  Follow-up with PCP Additional Notes:  Per pt went to pre op, mentioned frequent urination, states they wanted an order for u/a, pt is to have surgery Monday.   Please see note from agent.  Reason for Disposition  Urinating more frequently than usual (i.e., frequency)  Answer Assessment - Initial Assessment Questions 1. SYMPTOM: "What's the main symptom you're concerned about?" (e.g., frequency, incontinence)     Frequency 2. ONSET: "When did the    start?"     Friday 3. PAIN: "Is there any pain?" If Yes, ask: "How bad is it?" (Scale: 1-10; mild, moderate, severe)     No 4. CAUSE: "What do you think is causing the symptoms?"     No 5. OTHER SYMPTOMS: "Do you have any other symptoms?" (e.g., blood in urine, fever, flank pain, pain with urination)     No  Protocols used: Urinary Symptoms-A-AH

## 2022-04-30 NOTE — Progress Notes (Signed)
PCP - Durene Fruits, NP Cardiologist - Dr. Larae Grooms (Pt has yearly visit 05/02/22. RN instructed to inform MD of surgery in case any further work-up needed)  PPM/ICD - Denies Device Orders - n/a Rep Notified - n/a  Chest x-ray - n/a EKG - 04/30/2022 Stress Test - 2016 - Completed in [place], [place], results normal per pt ECHO - 08/24/2012 Cardiac Cath - 2001 and 2006 in [place], [place], results normal per pt  Sleep Study - Denies CPAP - n/a  Pt is DM2.She checks her blood sugars 1-2 times per day. Normal fasting blood sugar is 110s-130s. CBG at pre-op appointment was 180. For lunch, pt had a sausage biscuit, raisins, and water  Last dose of GLP1 agonist- Last dose of Ozempic was February 23rd. GLP1 instructions: Pt instructed to not take her dose Friday, March 1st. Pt understood instructions  Blood Thinner Instructions: n/a Aspirin Instructions: n/a  ERAS Protcol - Clear liquids until 0430 morning of surgery PRE-SURGERY Ensure or G2- n/a  COVID TEST- n/a   Anesthesia review: Yes. Abnormal EKG.   Patient denies shortness of breath, fever, cough and chest pain at PAT appointment. Pt denies any respiratory illness/infections in the past two months.  Pt did state that she was started on Augmentin 03/11/22 for a sinus infection. On 1/16 it was not much better so they started her on Levaquin. Then on 1/22 she saw an ENT who started her on Mupirocin (for dry nose) and Clindamycin for 10 days as well as ordered a CT of sinuses. Once pt completed Clindamycin 1/31, she stated that she felt much better with completion of last treatment.   All instructions explained to the patient, with a verbal understanding of the material. Patient agrees to go over the instructions while at home for a better understanding. Patient also instructed to self quarantine after being tested for COVID-19. The opportunity to ask questions was provided.

## 2022-04-30 NOTE — Telephone Encounter (Signed)
Patient called and LM on the PT VM to cxl her appointment for today.

## 2022-04-30 NOTE — Telephone Encounter (Signed)
Spoke with patient and cancelled appointment for 04/30/22. She is having surgery and will call back after she is better

## 2022-04-30 NOTE — Telephone Encounter (Signed)
Pharmacy Patient Advocate Encounter  Received notification from [employer] that the request for prior authorization for Courtney [name] has been denied due to not having diabetes education.    I just faxed over the notes from her diabetes and nutrition visit on 06/03/16. Hopefully they will re-review. If not, I will submit a formal appeal. I didn't realize it wasn't noted in her office visit notes.

## 2022-04-30 NOTE — Progress Notes (Signed)
Office Visit    Patient Name: Courtney [name] Date of Encounter: 04/30/2022  Primary Care Provider:  Camillia Herter, NP Primary Cardiologist:  Larae Grooms, MD Primary Electrophysiologist: None  Chief Complaint    Courtney [name] is a 67 y.o. female with PMH of chest pain with multiple LHC's performed that were normal.  HTN, HLD, DM2, Multiple Sclerosis (2009 in [place]; no radiologist evidence on MRI 2018 & 2021), hypothyroidism, who presents today for 1 year follow-up of hypertension.  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   Bowel obstruction (HCC)    Depression with anxiety 02/09/2015   mild xanax prn   Diabetes mellitus ([place]) 07/18/2015   type 2   Diverticulitis    Dysrhythmia    abnormal ekgs worked up   Essential tremor 03/31/2016   Fibromyalgia    MS   Gait difficulty 02/09/2015   Gallstones    GERD (gastroesophageal reflux disease)    HLD (hyperlipidemia)    Hypersomnia 11/09/2015   Hypertension    no meds   Hyperthyroidism    patient states has no problems at present-01/25/2019   Hypothyroidism    patient states no problems at present 01/25/2019   IBS (irritable bowel syndrome)    Insomnia 02/09/2015   Migraines    MS (multiple sclerosis) ([place])    Multiple joint pain 03/31/2016   Numbness in both hands 03/18/2016   Other fatigue 02/09/2015   Peptic ulcer    Pneumonia    multiple times. Last time 2014   PONV (postoperative nausea and vomiting)    Urinary hesitancy 02/09/2015   Vision abnormalities    Past Surgical History:  Procedure Laterality Date   ABDOMINAL HYSTERECTOMY  1976   including oophorectomy, due to uterine cancer   APPENDECTOMY  1974   BIOPSY  01/31/2019   Procedure: BIOPSY;  Surgeon: Mauri Pole, MD;  Location: [facility];  Service:  Endoscopy;;   CARDIAC CATHETERIZATION     2001/2006 In [place]. Normal per patient   CARPAL TUNNEL RELEASE Left    carpal tunnel surgery   CHOLECYSTECTOMY     2006   COLON SURGERY     [facility] x 2   COLONOSCOPY WITH PROPOFOL N/A 01/31/2019   Procedure: COLONOSCOPY WITH PROPOFOL;  Surgeon: Mauri Pole, MD;  [facility]: [facility];  Service: Endoscopy;  Laterality: N/A;   COLONOSCOPY WITH PROPOFOL N/A 04/24/2022   Procedure: COLONOSCOPY WITH PROPOFOL;  Surgeon: Mauri Pole, MD;  Location: [facility];  Service: Gastroenterology;  Laterality: N/A;   EUS N/A 04/09/2017   Procedure: UPPER ENDOSCOPIC ULTRASOUND (EUS) LINEAR;  Surgeon: Milus Banister, MD;  Location: [facility];  Service: Endoscopy;  Laterality: N/A;   HERNIA REPAIR     MENISCUS REPAIR Left    POLYPECTOMY  01/31/2019   Procedure: POLYPECTOMY;  Surgeon: Mauri Pole, MD;  [facility]: [facility];  Service: Endoscopy;;   POLYPECTOMY  04/24/2022   Procedure: POLYPECTOMY;  Surgeon: Mauri Pole, MD;  Location: [facility];  Service: Gastroenterology;;   SMALL INTESTINE SURGERY     toenail removal Right 12/2017   great toenail     Allergies  Allergies  Allergen Reactions   Ibuprofen Other (See Comments)    Developed a ulcer  Methylprednisolone Anaphylaxis, Shortness Of Breath and Palpitations   Naproxen Other (See Comments)    Developed a ulcer    Nsaids Other (See Comments)    Pt gets Ulcers    Prednisone Anaphylaxis, Shortness Of Breath and Palpitations   Bactrim [Sulfamethoxazole-Trimethoprim] Other (See Comments)    "blotchiness" redness, face swelling   Biaxin [Clarithromycin] Swelling   Sulfa Antibiotics Swelling and Other (See Comments)    Angioedema     History of Present Illness    Courtney [name]  is a 67 year old female with the above mention past medical history who presents today for 1 year follow-up.  She has been followed by Dr.Varanasi since 2017 when she  relocated from [place] to [place] to seek warmer weather.  She suffers from [place] with no radiologist evidence on MRI 2018 & 2021.  She has had multiple stress test for complaint of chest pain with most recent being completed in 2016 that was normal.  In 2020 her blood pressure was low and medications were discontinued with no recurrence of hypertension.  Her blood pressure was [id number] systolically.  She is statin intolerant due to leg cramps cholesterol has been well-controlled in the past with lifestyle.  She was last seen by Dr.Varanasi on 04/30/2021 and was doing well with no chest pain complaints at that time.  Blood pressure was well-controlled and patient was exercising 30 minutes several times a week on a stationary bike.  Courtney [name] presents today for 1 year follow-up alone.  Since last being seen in the office patient reports that she has been doing well with no new cardiac complaints.  She is tolerating her current medications without any adverse reactions.  Her blood pressure today is well-controlled at 138/72.  She continues to exercise with stationary bike and also walks frequently when weather permits.  She continues to remain off of antihypertensive medication.  She is following a low-sodium heart healthy diet and has maintained a steady weight since previous visit.  She reports no exacerbations of MS since her previous visit.  Patient denies chest pain, palpitations, dyspnea, PND, orthopnea, nausea, vomiting, dizziness, syncope, edema, weight gain, or early satiety.  Home Medications    Current Outpatient Medications  Medication Sig Dispense Refill   acetaminophen (TYLENOL) 500 MG tablet Take 1,000 mg by mouth every 8 (eight) hours as needed for headache.     ALPRAZolam (XANAX) 1 MG tablet Take 0.5-1 mg by mouth 3 (three) times daily as needed for anxiety.     Continuous Blood Gluc Sensor (DEXCOM G7 SENSOR) MISC 1 Device by Does not apply route as directed. 9 each 3   Estradiol 10  MCG TABS vaginal tablet Place 1 tablet (10 mcg total) vaginally at bedtime for 14 days, THEN 1 tablet (10 mcg total) 2 (two) times a week. (Patient not taking: Reported on 04/28/2022) 30 tablet 3   FREESTYLE LITE test strip USE 1 STRIP IN THE MORNING AND AT BEDTIME AS INSTRUCTED 200 strip 3   insulin aspart protamine - aspart (NOVOLOG 70/30 MIX) (70-30) 100 UNIT/ML FlexPen Inject 20 Units into the skin daily with breakfast AND 22 Units daily with supper. 30 mL 3   Insulin Pen Needle 32G X 4 MM MISC 1 Device by Does not apply route in the morning and at bedtime. 200 each 3   JARDIANCE 25 MG TABS tablet TAKE 1 TABLET DAILY 90 tablet 3   Lancets (FREESTYLE) lancets 1 Device by Misc.(Non-Drug; Combo Route) route  daily. USED TO CHECK BLOOD SUGAR TID     latanoprost (XALATAN) 0.005 % ophthalmic solution Place 1 drop into both eyes at bedtime.     loratadine (CLARITIN) 10 MG tablet Take 10 mg by mouth daily as needed for allergies (seasonal).     melatonin 5 MG TABS Take 10 mg by mouth at bedtime as needed (sleep).     modafinil (PROVIGIL) 100 MG tablet Take 25 mg by mouth daily as needed (Takes to help stay awake due to insomnia, takes at 6a if needed).     Multiple Vitamins-Minerals (MULTIVITAMIN ADULT) CHEW Chew 2 each by mouth 2 (two) times a week.     naloxone (NARCAN) nasal spray 4 mg/0.1 mL Place 1 spray into the nose daily as needed (overdose).     ondansetron (ZOFRAN) 4 MG tablet Take 1 tablet (4 mg total) by mouth every 6 (six) hours as needed for nausea or vomiting. Prior to colonoscopy prep 10 tablet 0   oxyCODONE (ROXICODONE) 15 MG immediate release tablet Take 15 mg by mouth 5 (five) times daily as needed for pain.     pantoprazole (PROTONIX) 40 MG tablet Take 1 tablet (40 mg total) by mouth daily. 90 tablet 3   Semaglutide,0.25 or 0.'5MG'$ /DOS, 2 MG/3ML SOPN Inject 0.5 mg into the skin once a week. (Patient taking differently: Inject 0.5 mg into the skin every Sunday.) 9 mL 3   terconazole  (TERAZOL 3) 0.8 % vaginal cream Place 1 applicator vaginally at bedtime. (Patient not taking: Reported on 04/22/2022) 60 g 2   No current facility-administered medications for this visit.     Review of Systems  Please see the history of present illness.     All other systems reviewed and are otherwise negative except as noted above.  Physical Exam    Wt Readings from Last 3 Encounters:  04/30/22 162 lb 9.6 oz (73.8 kg)  04/24/22 160 lb (72.6 kg)  04/08/22 163 lb 12.8 oz (74.3 kg)   [id number] were no vitals filed for this visit.,There is no height or weight on file to calculate BMI.  Constitutional:      Appearance: Healthy appearance. Not in distress.  Neck:     Vascular: JVD normal.  Pulmonary:     Effort: Pulmonary effort is normal.     Breath sounds: No wheezing. No rales. Diminished in the bases Cardiovascular:     Normal rate. Regular rhythm. Normal S1. Normal S2.      Murmurs: There is no murmur.  Edema:    Peripheral edema absent.  Abdominal:     Palpations: Abdomen is soft non tender. There is no hepatomegaly.  Skin:    General: Skin is warm and dry.  Neurological:     General: No focal deficit present.     Mental Status: Alert and oriented to person, place and time.     Cranial Nerves: Cranial nerves are intact.  EKG/LABS/Other Studies Reviewed    ECG personally reviewed by me today -none completed today   Lab Results  Component Value Date   WBC 6.4 04/26/2018   HGB 14.5 04/26/2018   HCT 43.0 04/26/2018   MCV 83.9 04/26/2018   PLT 179.0 04/26/2018   Lab Results  Component Value Date   CREATININE 1.30 (H) 08/14/2021   BUN 27 05/03/2021   NA 143 05/03/2021   K 4.3 05/03/2021   CL 102 05/03/2021   CO2 22 05/03/2021   Lab Results  Component Value Date   ALT 10  05/03/2021   AST 17 05/03/2021   ALKPHOS 91 05/03/2021   BILITOT 0.5 05/03/2021   Lab Results  Component Value Date   CHOL 197 05/03/2021   HDL 79 05/03/2021   LDLCALC 89 05/03/2021    TRIG 177 (H) 05/03/2021   CHOLHDL 2.5 05/03/2021    Lab Results  Component Value Date   HGBA1C 7.2 (A) 04/08/2022    Assessment & Plan    1.  Essential hypertension: -Well-controlled with no antihypertensive medications at 138/72  2.  Hyperlipidemia: -Currently followed by PCP  3.  DM type II: -Most recent hemoglobin A1c was poorly controlled at 7.2 -Continue diabetic regimen per PCP  4.  Multiple sclerosis: -no radiologist evidence on MRI 2018 & 2021), -Continue exercise 15 minutes a day for total of 150 minutes/week  Disposition: Follow-up with Larae Grooms, MD or APP in 12 months    Medication Adjustments/Labs and Tests Ordered: Current medicines are reviewed at length with the patient today.  Concerns regarding medicines are outlined above.   Signed, Mable Fill, Marissa Nestle, NP 04/30/2022, 2:02 PM  [facility]  Note:  This document was prepared using Dragon voice recognition software and may include unintentional dictation errors.

## 2022-04-30 NOTE — Progress Notes (Signed)
During pre admission appointment, pt endorsed frequent urination and itching since her colonoscopy on 04/24/22. RN consulted with our anesthesia department and the recommendation was to see if pts PCP wanted to order a UA, which we could collect and run while she was at [place] stated that she would only give the sample if she could provide it here and not go to her PCPs office. RN attempted to call office while pt was still here, but was on hold for extended amount of time. Later, office was reached after pt had already completed appointment and PCPs office stated that she would need to make an appointment to be evaluated. Office offered to message pt and let her know to make an appointment with the office if she still wished to have UA completed. RN asked to please send her a message and let her know.

## 2022-05-01 NOTE — Telephone Encounter (Signed)
Spoke w/pt states that she did not need to complete U/A with our office, it had been completed already

## 2022-05-02 ENCOUNTER — Ambulatory Visit: Payer: Medicare Other | Attending: Nurse Practitioner | Admitting: Nurse Practitioner

## 2022-05-02 ENCOUNTER — Encounter: Payer: Self-pay | Admitting: Nurse Practitioner

## 2022-05-02 VITALS — BP 138/72 | HR 77 | Ht 62.4 in | Wt 164.8 lb

## 2022-05-02 DIAGNOSIS — E785 Hyperlipidemia, unspecified: Secondary | ICD-10-CM | POA: Diagnosis not present

## 2022-05-02 DIAGNOSIS — E1142 Type 2 diabetes mellitus with diabetic polyneuropathy: Secondary | ICD-10-CM | POA: Diagnosis not present

## 2022-05-02 DIAGNOSIS — I10 Essential (primary) hypertension: Secondary | ICD-10-CM | POA: Diagnosis not present

## 2022-05-02 DIAGNOSIS — Z794 Long term (current) use of insulin: Secondary | ICD-10-CM | POA: Diagnosis not present

## 2022-05-02 DIAGNOSIS — I2510 Atherosclerotic heart disease of native coronary artery without angina pectoris: Secondary | ICD-10-CM | POA: Diagnosis not present

## 2022-05-02 NOTE — Anesthesia Preprocedure Evaluation (Signed)
Anesthesia Evaluation    Reviewed: Allergy & Precautions, Patient's Chart, lab work & pertinent test results  History of Anesthesia Complications (+) PONV and history of anesthetic complications  Airway Mallampati: II  TM Distance: >3 FB Neck ROM: Full    Dental no notable dental hx. (+) Dental Advisory Given   Pulmonary asthma , former smoker   Pulmonary exam normal        Cardiovascular hypertension, + CAD  Normal cardiovascular exam     Neuro/Psych  Headaches  Anxiety Depression    Multiple sclerosis    GI/Hepatic Neg liver ROS, PUD,GERD  Medicated,,Hx of adenomatous colonic polyps, GERD, Chronic constipation   Endo/Other  diabetes, Type 2, Oral Hypoglycemic Agents, Insulin DependentHypothyroidism    Renal/GU Renal InsufficiencyRenal disease  negative genitourinary   Musculoskeletal  (+) Arthritis ,  Fibromyalgia -  Abdominal   Peds  Hematology negative hematology ROS (+)   Anesthesia Other Findings Day of surgery medications reviewed with patient.  Reproductive/Obstetrics negative OB ROS                             Anesthesia Physical Anesthesia Plan  ASA: 2  Anesthesia Plan: General   Post-op Pain Management: Tylenol PO (pre-op)* and Toradol IV (intra-op)*   Induction:   PONV Risk Score and Plan: 4 or greater and Ondansetron, Dexamethasone, TIVA and Midazolam  Airway Management Planned: Oral ETT  Additional Equipment: None  Intra-op Plan:   Post-operative Plan: Extubation in OR  Informed Consent: I have reviewed the patients History and Physical, chart, labs and discussed the procedure including the risks, benefits and alternatives for the proposed anesthesia with the patient or authorized representative who has indicated his/her understanding and acceptance.     Dental advisory given  Plan Discussed with: Anesthesiologist and CRNA  Anesthesia Plan Comments: (PAT  note written by Myra Gianotti, PA-C.  )       Anesthesia Quick Evaluation

## 2022-05-02 NOTE — Patient Instructions (Signed)
Medication Instructions:  Your physician recommends that you continue on your current medications as directed. Please refer to the Current Medication list given to you today. *If you need a refill on your cardiac medications before your next appointment, please call your pharmacy*   Lab Work: None Ordered If you have labs (blood work) drawn today and your tests are completely normal, you will receive your results only by: [place] (if you have MyChart) OR A paper copy in the mail If you have any lab test that is abnormal or we need to change your treatment, we will call you to review the results.   Testing/Procedures: None Ordered   Follow-Up: At [facility], you and your health needs are our priority.  As part of our continuing mission to provide you with exceptional heart care, we have created designated Provider Care Teams.  These Care Teams include your primary Cardiologist (physician) and Advanced Practice Providers (APPs -  Physician Assistants and Nurse Practitioners) who all work together to provide you with the care you need, when you need it.  We recommend signing up for the patient portal called "MyChart".  Sign up information is provided on this After Visit Summary.  MyChart is used to connect with patients for Virtual Visits (Telemedicine).  Patients are able to view lab/test results, encounter notes, upcoming appointments, etc.  Non-urgent messages can be sent to your provider as well.   To learn more about what you can do with MyChart, go to [email or web address].    Your next appointment:   12 month(s)  Provider:   Larae Grooms, MD     Other Instructions

## 2022-05-05 ENCOUNTER — Encounter (HOSPITAL_COMMUNITY)
Admission: RE | Disposition: A | Discharge status: Home / Self Care | Payer: Self-pay | Source: Ambulatory Visit | Attending: Surgery

## 2022-05-05 ENCOUNTER — Ambulatory Visit (HOSPITAL_COMMUNITY)
Admission: RE | Admit: 2022-05-05 | Discharge: 2022-05-05 | Disposition: A | Discharge status: Home / Self Care | Payer: Medicare Other | Source: Ambulatory Visit | Attending: Surgery | Admitting: Surgery

## 2022-05-05 ENCOUNTER — Other Ambulatory Visit: Payer: Self-pay

## 2022-05-05 ENCOUNTER — Ambulatory Visit (HOSPITAL_BASED_OUTPATIENT_CLINIC_OR_DEPARTMENT_OTHER): Payer: Medicare Other | Admitting: Anesthesiology

## 2022-05-05 ENCOUNTER — Ambulatory Visit (HOSPITAL_COMMUNITY): Payer: Medicare Other | Admitting: Vascular Surgery

## 2022-05-05 ENCOUNTER — Encounter (HOSPITAL_COMMUNITY): Payer: Self-pay | Admitting: Surgery

## 2022-05-05 DIAGNOSIS — K432 Incisional hernia without obstruction or gangrene: Secondary | ICD-10-CM | POA: Insufficient documentation

## 2022-05-05 DIAGNOSIS — K660 Peritoneal adhesions (postprocedural) (postinfection): Secondary | ICD-10-CM | POA: Diagnosis not present

## 2022-05-05 DIAGNOSIS — Z09 Encounter for follow-up examination after completed treatment for conditions other than malignant neoplasm: Secondary | ICD-10-CM | POA: Diagnosis not present

## 2022-05-05 DIAGNOSIS — E119 Type 2 diabetes mellitus without complications: Secondary | ICD-10-CM

## 2022-05-05 DIAGNOSIS — Z87891 Personal history of nicotine dependence: Secondary | ICD-10-CM | POA: Insufficient documentation

## 2022-05-05 HISTORY — PX: INSERTION OF MESH: SHX5868

## 2022-05-05 HISTORY — PX: INCISIONAL HERNIA REPAIR: SHX193

## 2022-05-05 LAB — GLUCOSE, CAPILLARY
Glucose-Capillary: 123 mg/dL — ABNORMAL HIGH (ref 70–99)
Glucose-Capillary: 162 mg/dL — ABNORMAL HIGH (ref 70–99)

## 2022-05-05 SURGERY — REPAIR, HERNIA, INCISIONAL, LAPAROSCOPIC
Anesthesia: General | Site: Abdomen

## 2022-05-05 MED ORDER — MIDAZOLAM HCL 5 MG/5ML IJ SOLN
INTRAMUSCULAR | Status: DC | PRN
  Administered 2022-05-05: 2 mg via INTRAVENOUS

## 2022-05-05 MED ORDER — PROMETHAZINE HCL 25 MG/ML IJ SOLN: 6.2500 mg | INTRAMUSCULAR | Status: DC | PRN

## 2022-05-05 MED ORDER — LACTATED RINGERS IV SOLN: INTRAVENOUS | Status: DC

## 2022-05-05 MED ORDER — PROPOFOL 500 MG/50ML IV EMUL
INTRAVENOUS | Status: DC | PRN
  Administered 2022-05-05: 125 ug/kg/min via INTRAVENOUS

## 2022-05-05 MED ORDER — PROPOFOL 1000 MG/100ML IV EMUL
INTRAVENOUS | Status: AC
  Filled 2022-05-05: qty 100

## 2022-05-05 MED ORDER — FENTANYL CITRATE (PF) 250 MCG/5ML IJ SOLN
INTRAMUSCULAR | Status: DC | PRN
  Administered 2022-05-05: 100 ug via INTRAVENOUS
  Administered 2022-05-05 (×3): 50 ug via INTRAVENOUS

## 2022-05-05 MED ORDER — ONDANSETRON HCL 4 MG PO TABS: 4.0000 mg | ORAL_TABLET | Freq: Four times a day (QID) | ORAL | 0 refills | Status: DC | PRN

## 2022-05-05 MED ORDER — CHLORHEXIDINE GLUCONATE CLOTH 2 % EX PADS: 6.0000 | MEDICATED_PAD | Freq: Once | CUTANEOUS | Status: DC

## 2022-05-05 MED ORDER — SCOPOLAMINE 1 MG/3DAYS TD PT72
1.0000 | MEDICATED_PATCH | TRANSDERMAL | Status: DC
  Administered 2022-05-05: 1.5 mg via TRANSDERMAL
  Filled 2022-05-05: qty 1

## 2022-05-05 MED ORDER — LABETALOL HCL 5 MG/ML IV SOLN
INTRAVENOUS | Status: AC
  Filled 2022-05-05: qty 4

## 2022-05-05 MED ORDER — LIDOCAINE 2% (20 MG/ML) 5 ML SYRINGE
INTRAMUSCULAR | Status: AC
  Filled 2022-05-05: qty 5

## 2022-05-05 MED ORDER — BUPIVACAINE-EPINEPHRINE 0.25% -1:200000 IJ SOLN
INTRAMUSCULAR | Status: DC | PRN
  Administered 2022-05-05: 30 mL

## 2022-05-05 MED ORDER — ORAL CARE MOUTH RINSE: 15.0000 mL | Freq: Once | OROMUCOSAL | Status: AC

## 2022-05-05 MED ORDER — ROCURONIUM BROMIDE 10 MG/ML (PF) SYRINGE
PREFILLED_SYRINGE | INTRAVENOUS | Status: DC | PRN
  Administered 2022-05-05: 70 mg via INTRAVENOUS

## 2022-05-05 MED ORDER — CEFAZOLIN SODIUM-DEXTROSE 2-4 GM/100ML-% IV SOLN
2.0000 g | INTRAVENOUS | Status: AC
  Administered 2022-05-05: 2 g via INTRAVENOUS
  Filled 2022-05-05: qty 100

## 2022-05-05 MED ORDER — ONDANSETRON HCL 4 MG/2ML IJ SOLN
INTRAMUSCULAR | Status: AC
  Filled 2022-05-05: qty 2

## 2022-05-05 MED ORDER — KETOROLAC TROMETHAMINE 30 MG/ML IJ SOLN
INTRAMUSCULAR | Status: AC
  Filled 2022-05-05: qty 1

## 2022-05-05 MED ORDER — STERILE WATER FOR IRRIGATION IR SOLN
Status: DC | PRN
  Administered 2022-05-05: 1000 mL

## 2022-05-05 MED ORDER — ROCURONIUM BROMIDE 10 MG/ML (PF) SYRINGE
PREFILLED_SYRINGE | INTRAVENOUS | Status: AC
  Filled 2022-05-05: qty 10

## 2022-05-05 MED ORDER — FENTANYL CITRATE (PF) 100 MCG/2ML IJ SOLN: 25.0000 ug | INTRAMUSCULAR | Status: DC | PRN

## 2022-05-05 MED ORDER — AMISULPRIDE (ANTIEMETIC) 5 MG/2ML IV SOLN
INTRAVENOUS | Status: AC
  Filled 2022-05-05: qty 4

## 2022-05-05 MED ORDER — SUGAMMADEX SODIUM 200 MG/2ML IV SOLN
INTRAVENOUS | Status: DC | PRN
  Administered 2022-05-05: 200 mg via INTRAVENOUS

## 2022-05-05 MED ORDER — FENTANYL CITRATE (PF) 250 MCG/5ML IJ SOLN
INTRAMUSCULAR | Status: AC
  Filled 2022-05-05: qty 5

## 2022-05-05 MED ORDER — ONDANSETRON HCL 4 MG/2ML IJ SOLN
INTRAMUSCULAR | Status: DC | PRN
  Administered 2022-05-05: 4 mg via INTRAVENOUS

## 2022-05-05 MED ORDER — PROPOFOL 10 MG/ML IV BOLUS
INTRAVENOUS | Status: AC
  Filled 2022-05-05: qty 20

## 2022-05-05 MED ORDER — 0.9 % SODIUM CHLORIDE (POUR BTL) OPTIME
TOPICAL | Status: DC | PRN
  Administered 2022-05-05: 1000 mL

## 2022-05-05 MED ORDER — BUPIVACAINE-EPINEPHRINE (PF) 0.25% -1:200000 IJ SOLN
INTRAMUSCULAR | Status: AC
  Filled 2022-05-05: qty 30

## 2022-05-05 MED ORDER — CHLORHEXIDINE GLUCONATE 0.12 % MT SOLN
15.0000 mL | Freq: Once | OROMUCOSAL | Status: AC
  Administered 2022-05-05: 15 mL via OROMUCOSAL
  Filled 2022-05-05: qty 15

## 2022-05-05 MED ORDER — MIDAZOLAM HCL 2 MG/2ML IJ SOLN
INTRAMUSCULAR | Status: AC
  Filled 2022-05-05: qty 2

## 2022-05-05 MED ORDER — AMISULPRIDE (ANTIEMETIC) 5 MG/2ML IV SOLN
10.0000 mg | Freq: Once | INTRAVENOUS | Status: AC | PRN
  Administered 2022-05-05: 10 mg via INTRAVENOUS

## 2022-05-05 MED ORDER — PROPOFOL 10 MG/ML IV BOLUS
INTRAVENOUS | Status: DC | PRN
  Administered 2022-05-05: 20 mg via INTRAVENOUS
  Administered 2022-05-05: 50 mg via INTRAVENOUS
  Administered 2022-05-05: 130 mg via INTRAVENOUS

## 2022-05-05 MED ORDER — ACETAMINOPHEN 500 MG PO TABS: 1000.0000 mg | ORAL_TABLET | Freq: Once | ORAL | Status: DC

## 2022-05-05 MED ORDER — LIDOCAINE 2% (20 MG/ML) 5 ML SYRINGE
INTRAMUSCULAR | Status: DC | PRN
  Administered 2022-05-05: 100 mg via INTRAVENOUS

## 2022-05-05 MED ORDER — PHENYLEPHRINE HCL-NACL 20-0.9 MG/250ML-% IV SOLN
INTRAVENOUS | Status: DC | PRN
  Administered 2022-05-05: 25 ug/min via INTRAVENOUS

## 2022-05-05 MED ORDER — ACETAMINOPHEN 500 MG PO TABS
1000.0000 mg | ORAL_TABLET | ORAL | Status: AC
  Administered 2022-05-05: 1000 mg via ORAL
  Filled 2022-05-05: qty 2

## 2022-05-05 MED ORDER — LABETALOL HCL 5 MG/ML IV SOLN
INTRAVENOUS | Status: DC | PRN
  Administered 2022-05-05 (×2): 5 mg via INTRAVENOUS

## 2022-05-05 SURGICAL SUPPLY — 45 items
ADH SKN CLS APL DERMABOND .7 (GAUZE/BANDAGES/DRESSINGS) ×1
APL PRP STRL LF DISP 70% ISPRP (MISCELLANEOUS) ×1
BAG COUNTER SPONGE SURGICOUNT (BAG) ×1 IMPLANT
BAG SPNG CNTER NS LX DISP (BAG) ×1
CANISTER SUCT 3000ML PPV (MISCELLANEOUS) IMPLANT
CHLORAPREP W/TINT 26 (MISCELLANEOUS) ×1 IMPLANT
COVER SURGICAL LIGHT HANDLE (MISCELLANEOUS) ×1 IMPLANT
DERMABOND ADVANCED .7 DNX12 (GAUZE/BANDAGES/DRESSINGS) ×1 IMPLANT
DEVICE SECURE STRAP 25 ABSORB (INSTRUMENTS) IMPLANT
DEVICE TROCAR PUNCTURE CLOSURE (ENDOMECHANICALS) ×1 IMPLANT
ELECT REM PT RETURN 9FT ADLT (ELECTROSURGICAL) ×1
ELECTRODE REM PT RTRN 9FT ADLT (ELECTROSURGICAL) ×1 IMPLANT
GLOVE BIOGEL PI IND STRL 6 (GLOVE) ×1 IMPLANT
GLOVE BIOGEL PI IND STRL 6.5 (GLOVE) IMPLANT
GLOVE BIOGEL PI MICRO STRL 5.5 (GLOVE) IMPLANT
GOWN STRL REUS W/ TWL LRG LVL3 (GOWN DISPOSABLE) ×3 IMPLANT
GOWN STRL REUS W/TWL LRG LVL3 (GOWN DISPOSABLE) ×3
GRASPER SUT TROCAR 14GX15 (MISCELLANEOUS) ×1 IMPLANT
IRRIG SUCT STRYKERFLOW 2 WTIP (MISCELLANEOUS) ×1
IRRIGATION SUCT STRKRFLW 2 WTP (MISCELLANEOUS) IMPLANT
KIT BASIN OR (CUSTOM PROCEDURE TRAY) ×1 IMPLANT
KIT TURNOVER KIT B (KITS) ×1 IMPLANT
L-HOOK LAP DISP 36CM (ELECTROSURGICAL) ×1
LHOOK LAP DISP 36CM (ELECTROSURGICAL) IMPLANT
MARKER SKIN DUAL TIP RULER LAB (MISCELLANEOUS) ×1 IMPLANT
MESH VENTRALIGHT ST 4.5IN (Mesh General) IMPLANT
NDL INSUFFLATION 14GA 120MM (NEEDLE) ×1 IMPLANT
NDL SPNL 22GX3.5 QUINCKE BK (NEEDLE) ×1 IMPLANT
NEEDLE INSUFFLATION 14GA 120MM (NEEDLE) ×1 IMPLANT
NEEDLE SPNL 22GX3.5 QUINCKE BK (NEEDLE) ×1 IMPLANT
NS IRRIG 1000ML POUR BTL (IV SOLUTION) ×1 IMPLANT
PAD ARMBOARD 7.5X6 YLW CONV (MISCELLANEOUS) ×1 IMPLANT
PENCIL SMOKE EVACUATOR (MISCELLANEOUS) IMPLANT
SCISSORS LAP 5X35 DISP (ENDOMECHANICALS) ×1 IMPLANT
SET TUBE SMOKE EVAC HIGH FLOW (TUBING) ×1 IMPLANT
SHEARS HARMONIC ACE PLUS 36CM (ENDOMECHANICALS) IMPLANT
SLEEVE Z-THREAD 5X100MM (TROCAR) ×1 IMPLANT
SUT MNCRL AB 4-0 PS2 18 (SUTURE) ×1 IMPLANT
SUT NOVA NAB GS-21 0 18 T12 DT (SUTURE) IMPLANT
TOWEL GREEN STERILE (TOWEL DISPOSABLE) ×1 IMPLANT
TOWEL GREEN STERILE FF (TOWEL DISPOSABLE) ×1 IMPLANT
TRAY LAPAROSCOPIC MC (CUSTOM PROCEDURE TRAY) ×1 IMPLANT
TROCAR Z THREAD OPTICAL 12X100 (TROCAR) ×1 IMPLANT
TROCAR Z-THREAD OPTICAL 5X100M (TROCAR) ×1 IMPLANT
WATER STERILE IRR 1000ML POUR (IV SOLUTION) ×1 IMPLANT

## 2022-05-05 NOTE — Interval H&P Note (Signed)
History and Physical Interval Note:  05/05/2022 7:18 AM  Courtney [name]  has presented today for surgery, with the diagnosis of INCISIONAL HERNIA.  The various methods of treatment have been discussed with the patient and family. After consideration of risks, benefits and other options for treatment, the patient has consented to  Procedure(s): LAPAROSCOPIC POSSIBLE OPEN, VENTRAL HERNA REPAIR WITH MESH (N/A) as a surgical intervention.  [name] history has been reviewed, patient examined, no change in status, stable for surgery.  I have reviewed the patient's chart and labs.  Questions were answered to the patient's satisfaction.     Dwan Bolt

## 2022-05-05 NOTE — Progress Notes (Signed)
Virtual Visit via Telephone Note  I connected with Courtney [name], on 05/12/2022 at 11:16 AM by telephone and verified that I am speaking with the correct person using two identifiers.  Consent: I discussed the limitations, risks, security and privacy concerns of performing an evaluation and management service by telephone and the availability of in person appointments. I also discussed with the patient that there may be a patient responsible charge related to this service. The patient expressed understanding and agreed to proceed.   Location of Patient: Home  Location of Provider: [facility] [place] participating in Telemedicine visit: Donal[place], NP Elmon Else, [place]  History of Present Illness: Courtney [name]. Courtney [name] is a 67 y.o. female who presents for insomnia. States she has tried "so many medications" in the past to help with insomnia and none effective. Reports she had a sleep study in the past as well. Reports when she goes to sleep it feels like she has been asleep for hours. However, when she looks at the clock it has only been 15 minutes. States her nose bleeds have been taken care of. Reports she has an upcoming appointment with Orthopedics for back pain and right leg pain. She is established with [facility] and most recent appointment was on 05/09/2022. No further issues/concerns for discussion today.    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   Bowel obstruction (HCC)    Depression with anxiety 02/09/2015   mild xanax prn   Diabetes mellitus ([place]) 07/18/2015   type 2   Diverticulitis    Dysrhythmia    abnormal ekgs worked up   Essential tremor 03/31/2016   Fibromyalgia    MS   Gait difficulty 02/09/2015   Gallstones    GERD  (gastroesophageal reflux disease)    HLD (hyperlipidemia)    Hypersomnia 11/09/2015   Hypertension    no meds   Hyperthyroidism    patient states has no problems at present-01/25/2019   Hypothyroidism    patient states no problems at present 01/25/2019   IBS (irritable bowel syndrome)    Insomnia 02/09/2015   Migraines    MS (multiple sclerosis) ([place])    Multiple joint pain 03/31/2016   Numbness in both hands 03/18/2016   Other fatigue 02/09/2015   Peptic ulcer    Pneumonia    multiple times. Last time 2014   PONV (postoperative nausea and vomiting)    Urinary hesitancy 02/09/2015   Vision abnormalities    Allergies  Allergen Reactions   Ibuprofen Other (See Comments)    Developed a ulcer     Methylprednisolone Anaphylaxis, Shortness Of Breath and Palpitations   Naproxen Other (See Comments)    Developed a ulcer    Nsaids Other (See Comments)    Pt gets Ulcers    Prednisone Anaphylaxis, Shortness Of Breath and Palpitations   Bactrim [Sulfamethoxazole-Trimethoprim] Other (See Comments)    "blotchiness" redness, face swelling   Biaxin [Clarithromycin] Swelling   Sulfa Antibiotics Swelling and Other (See Comments)    Angioedema     Current Outpatient Medications on File Prior to Visit  Medication Sig Dispense Refill   acetaminophen (TYLENOL) 500 MG tablet Take 1,000 mg by mouth every 8 (eight) hours as needed for headache.  ALPRAZolam (XANAX) 1 MG tablet Take 0.5-1 mg by mouth 3 (three) times daily as needed for anxiety.     Continuous Blood Gluc Sensor (DEXCOM G7 SENSOR) MISC 1 Device by Does not apply route as directed. 9 each 3   Estradiol 10 MCG TABS vaginal tablet Place 1 tablet (10 mcg total) vaginally at bedtime for 14 days, THEN 1 tablet (10 mcg total) 2 (two) times a week. 30 tablet 3   FREESTYLE LITE test strip USE 1 STRIP IN THE MORNING AND AT BEDTIME AS INSTRUCTED 200 strip 3   insulin aspart protamine - aspart (NOVOLOG 70/30 MIX) (70-30) 100 UNIT/ML  FlexPen Inject 20 Units into the skin daily with breakfast AND 22 Units daily with supper. 30 mL 3   Insulin Pen Needle 32G X 4 MM MISC 1 Device by Does not apply route in the morning and at bedtime. 200 each 3   JARDIANCE 25 MG TABS tablet TAKE 1 TABLET DAILY 90 tablet 3   Lancets (FREESTYLE) lancets 1 Device by Misc.(Non-Drug; Combo Route) route daily. USED TO CHECK BLOOD SUGAR TID     latanoprost (XALATAN) 0.005 % ophthalmic solution Place 1 drop into both eyes at bedtime.     loratadine (CLARITIN) 10 MG tablet Take 10 mg by mouth daily as needed for allergies (seasonal).     melatonin 5 MG TABS Take 10 mg by mouth at bedtime as needed (sleep).     modafinil (PROVIGIL) 100 MG tablet Take 25 mg by mouth daily as needed (Takes to help stay awake due to insomnia, takes at 6a if needed).     Multiple Vitamins-Minerals (MULTIVITAMIN ADULT) CHEW Chew 2 each by mouth 2 (two) times a week.     naloxone (NARCAN) nasal spray 4 mg/0.1 mL Place 1 spray into the nose daily as needed (overdose). (Patient not taking: Reported on 05/02/2022)     ondansetron (ZOFRAN) 4 MG tablet Take 1 tablet (4 mg total) by mouth every 6 (six) hours as needed for nausea or vomiting. Prior to colonoscopy prep 15 tablet 0   oxyCODONE (ROXICODONE) 15 MG immediate release tablet Take 15 mg by mouth 5 (five) times daily as needed for pain.     pantoprazole (PROTONIX) 40 MG tablet Take 1 tablet (40 mg total) by mouth daily. 90 tablet 3   Semaglutide,0.25 or 0.'5MG'$ /DOS, 2 MG/3ML SOPN Inject 0.5 mg into the skin once a week. (Patient taking differently: Inject 0.5 mg into the skin every Sunday.) 9 mL 3   terconazole (TERAZOL 3) 0.8 % vaginal cream Place 1 applicator vaginally at bedtime. 60 g 2   [DISCONTINUED] sucralfate (CARAFATE) 1 g tablet Take 1 tablet (1 g total) by mouth 4 (four) times daily -  before meals and at bedtime. Dissolve 1 tablet in 2-4 oz of water, drink as slurry 120 tablet 1   No current facility-administered  medications on file prior to visit.    Observations/Objective: Alert and oriented x 3. Not in acute distress. Physical examination not completed as this is a telemedicine visit.  Assessment and Plan: 1. Insomnia, unspecified type - Sleep study for further evaluation.  - PSG Sleep Study; Future   Follow Up Instructions: Follow-up with primary provider as scheduled.    Patient was given clear instructions to go to Emergency Department or return to medical center if symptoms don't improve, worsen, or new problems develop.The patient verbalized understanding.  I discussed the assessment and treatment plan with the patient. The patient was provided an opportunity  to ask questions and all were answered. The patient agreed with the plan and demonstrated an understanding of the instructions.   The patient was advised to call back or seek an in-person evaluation if the symptoms worsen or if the condition fails to improve as anticipated.     I provided 10 minutes total of non-face-to-face time during this encounter.   Camillia Herter, NP  [facility] [place], [place] 05/12/2022, 12:19 PM

## 2022-05-05 NOTE — Anesthesia Postprocedure Evaluation (Signed)
Anesthesia Post Note  Patient: Courtney [name]  Procedure(s) Performed: LAPAROSCOPIC VENTRAL HERNA REPAIR WITH MESH (Abdomen) INSERTION OF MESH (Abdomen)     Patient location during evaluation: PACU Anesthesia Type: General Level of consciousness: sedated Pain management: pain level controlled Vital Signs Assessment: post-procedure vital signs reviewed and stable Respiratory status: spontaneous breathing and respiratory function stable Cardiovascular status: stable Postop Assessment: no apparent nausea or vomiting Anesthetic complications: no  No notable events documented.  Last Vitals:  Vitals:   05/05/22 0945 05/05/22 1000  BP: (!) 153/86 (!) 158/95  Pulse: 68 68  Resp: 16 14  Temp:  37.1 C  SpO2: 94% 94%    Last Pain:  Vitals:   05/05/22 0930  TempSrc:   PainSc: 7                  Cybil [place] DANIEL

## 2022-05-05 NOTE — Anesthesia Procedure Notes (Signed)
Procedure Name: Intubation Date/Time: 05/05/2022 7:39 AM  Performed by: Kyung Rudd, CRNAPre-anesthesia Checklist: Patient identified, Emergency Drugs available, Suction available and Patient being monitored Patient Re-evaluated:Patient Re-evaluated prior to induction Oxygen Delivery Method: Circle system utilized Preoxygenation: Pre-oxygenation with 100% oxygen Induction Type: IV induction Ventilation: Mask ventilation without difficulty Laryngoscope Size: Mac and 3 Grade View: Grade I Tube type: Oral Tube size: 7.0 mm Number of attempts: 1 Airway Equipment and Method: Stylet Placement Confirmation: ETT inserted through vocal cords under direct vision, positive ETCO2 and breath sounds checked- equal and bilateral Secured at: 20 cm Tube secured with: Tape Dental Injury: Teeth and Oropharynx as per pre-operative assessment

## 2022-05-05 NOTE — Discharge Instructions (Addendum)
[facility] DISCHARGE INSTRUCTIONS: HERNIA REPAIR  Activity No heavy lifting greater than 15 pounds for 8 weeks after surgery. Ok to shower in 24 hours, but do not bathe or submerge incisions underwater. Do not drive while taking narcotic pain medication.  Wound Care Your incisions are covered with skin glue called Dermabond. This will peel off on its own over time. You may shower and allow warm soapy water to run over your incisions. Gently pat dry. Do not submerge your incision underwater. Monitor your incision for any new redness, tenderness, or drainage.  When to Call us: Fever greater than 100.5 New redness, drainage, or swelling at incision site Severe pain, nausea, or vomiting  Follow-up You have an appointment scheduled with Dr. Zenia Resides on May 27, 2022 at 10:20am. This will be at the [facility] office at [address], [place], [place], [place]. Please arrive at least 15 minutes prior to your scheduled appointment time.  For questions or concerns, please call the office at [phone].

## 2022-05-05 NOTE — Transfer of Care (Signed)
Immediate Anesthesia Transfer of Care Note  Patient: Courtney [name]  Procedure(s) Performed: LAPAROSCOPIC VENTRAL HERNA REPAIR WITH MESH (Abdomen) INSERTION OF MESH (Abdomen)  Patient Location: PACU  Anesthesia Type:General  Level of Consciousness: awake, alert , and oriented  Airway & Oxygen Therapy: Patient Spontanous Breathing and Patient connected to face mask oxygen  Post-op Assessment: Report given to RN, Post -op Vital signs reviewed and stable, and Patient moving all extremities X 4  Post vital signs: Reviewed and stable  Last Vitals:  Vitals Value Taken Time  BP 167/90 05/05/22 0930  Temp    Pulse 69 05/05/22 0932  Resp 19 05/05/22 0932  SpO2 95 % 05/05/22 0932  Vitals shown include unvalidated device data.  Last Pain:  Vitals:   05/05/22 0640  TempSrc:   PainSc: 7          Complications: No notable events documented.

## 2022-05-05 NOTE — Op Note (Signed)
Date: 05/05/22  Patient: Courtney [name] MRN: [id number]  Preoperative Diagnosis: Ventral incisional hernia Postoperative Diagnosis: Same  Procedure: Laparoscopic ventral incisional hernia repair with mesh, lysis of adhesions  Surgeon: Michaelle Birks, MD  EBL: Minimal  Anesthesia: General endotracheal  Specimens: None  Indications: Ms. Redmond School is a 67 yo female with a history of multiple prior abdominal surgeries, who presented with pain at a previous incision and was found to have a small hernia. After an extensive discussion of the risks and benefits of surgery, she agreed to proceed with hernia repair.  Findings: Epigastric incisional hernia, fascial defect measuring approximately 1.5cm, repaired with a Ventralight circular mesh cut to 6cm in diameter. Very small <0.5cm fascial defect more inferiorly, repaired primarily.   Procedure details: Informed consent was obtained in the preoperative area prior to the procedure. The patient was brought to the operating room and placed on the table in the supine position. General anesthesia was induced and appropriate lines and drains were placed for intraoperative monitoring. Perioperative antibiotics were administered per SCIP guidelines. The abdomen was prepped and draped in the usual sterile fashion. A pre-procedure timeout was taken verifying patient identity, surgical site and procedure to be performed.  A small skin incision was made at Palmer's point, the fascia was grasped and elevated, and a Veress needle was inserted through the fascia. Intraperitoneal placement was confirmed with the saline drop test and the abdomen was insufflated. A 23m Visiport was placed. There were extensive adhesions to the abdominal wall at the previous midline incision. Superiorly this was primarily omentum. Below the umbilicus there was bowel visible within the adhesions. An additional 582mport was placed in the LLQ under direct visualization. The omental  adhesions at the upper midline were taken down using sharp and blunt dissection. Cautery was used to control bleeding from the abdominal wall. Once the midline was freed of adhesions above the umbilicus, a hernia defect was visible in the epigastric area corresponding to the hernia noted on preoperative imaging. The fascial defect was approximately 1.5cm in diameter. A small stab incision was made overlying the hernia defect, and the defect was closed using two interrupted 0 Novafil sutures with a PMI. Next, a 1219mort was placed in the RUQ under direct visualization. A circular sheet of 4.5-inch Ventralight mesh was brought onto the field, and measured and cut to a diameter of 6cm. Anchoring 0 Novafil sutures were placed at each of the four cardinal points of the mesh. Externally on the skin, the mesh was used to measure and mark each of the four anchoring points. The mesh was then rolled up and inserted into the abdomen, then unrolled with the smooth side facing away from the peritoneum. Stab incisions were made at each of the four cardinal points in the skin, and the sutures on the mesh were brought through the abdominal wall using a PMI. The sutures were pulled up and the mesh brought flat against the abdominal wall. The sutures were tied down. PDS tacks were then placed around the borders of the mesh to keep it flat against the abdominal wall. At this point a very small additional hernia defect <0.5cm in diameter was noted inferior to the mesh at midline. A stab incision was made overlying this defect and it was repaired primarily with a 1 Novafil figure-of-eight suture. The abdomen was inspected and appeared hemostatic. The 66m37mrt was removed and the fascia closed with a 0 Vicryl suture using a PMI. The remaining ports were removed  and the abdomen was desufflated. The skin at all port sites was closed with 4-0 monocryl subcuticular suture. Dermabond was applied.  The patient tolerated the procedure well  with no apparent complications. All counts were correct x2 at the end of the procedure. The patient was extubated and taken to PACU in stable condition.  Michaelle Birks, MD 05/05/22 9:13 AM

## 2022-05-06 ENCOUNTER — Encounter (HOSPITAL_COMMUNITY): Payer: Self-pay | Admitting: Surgery

## 2022-05-09 DIAGNOSIS — K432 Incisional hernia without obstruction or gangrene: Secondary | ICD-10-CM | POA: Diagnosis not present

## 2022-05-09 DIAGNOSIS — Z9889 Other specified postprocedural states: Secondary | ICD-10-CM | POA: Diagnosis not present

## 2022-05-09 DIAGNOSIS — Z8719 Personal history of other diseases of the digestive system: Secondary | ICD-10-CM | POA: Diagnosis not present

## 2022-05-12 ENCOUNTER — Other Ambulatory Visit (HOSPITAL_COMMUNITY): Payer: Self-pay

## 2022-05-12 ENCOUNTER — Telehealth (INDEPENDENT_AMBULATORY_CARE_PROVIDER_SITE_OTHER): Payer: Medicare Other | Admitting: Family

## 2022-05-12 DIAGNOSIS — G4700 Insomnia, unspecified: Secondary | ICD-10-CM | POA: Diagnosis not present

## 2022-05-12 NOTE — Progress Notes (Signed)
Pt presents for nose bleeds -states that before surgery patient experienced an 8 day nose bleed

## 2022-05-16 DIAGNOSIS — Z9181 History of falling: Secondary | ICD-10-CM | POA: Diagnosis not present

## 2022-05-16 DIAGNOSIS — E1140 Type 2 diabetes mellitus with diabetic neuropathy, unspecified: Secondary | ICD-10-CM | POA: Diagnosis not present

## 2022-05-16 DIAGNOSIS — E663 Overweight: Secondary | ICD-10-CM | POA: Diagnosis not present

## 2022-05-16 DIAGNOSIS — M545 Low back pain, unspecified: Secondary | ICD-10-CM | POA: Diagnosis not present

## 2022-05-16 DIAGNOSIS — T402X5A Adverse effect of other opioids, initial encounter: Secondary | ICD-10-CM | POA: Diagnosis not present

## 2022-05-16 DIAGNOSIS — M546 Pain in thoracic spine: Secondary | ICD-10-CM | POA: Diagnosis not present

## 2022-05-16 DIAGNOSIS — Z79899 Other long term (current) drug therapy: Secondary | ICD-10-CM | POA: Diagnosis not present

## 2022-05-16 DIAGNOSIS — F3342 Major depressive disorder, recurrent, in full remission: Secondary | ICD-10-CM | POA: Diagnosis not present

## 2022-05-16 DIAGNOSIS — G47429 Narcolepsy in conditions classified elsewhere without cataplexy: Secondary | ICD-10-CM | POA: Diagnosis not present

## 2022-05-16 DIAGNOSIS — N1831 Chronic kidney disease, stage 3a: Secondary | ICD-10-CM | POA: Diagnosis not present

## 2022-05-16 DIAGNOSIS — K5903 Drug induced constipation: Secondary | ICD-10-CM | POA: Diagnosis not present

## 2022-05-19 ENCOUNTER — Encounter: Payer: Self-pay | Admitting: Internal Medicine

## 2022-05-20 DIAGNOSIS — Z79899 Other long term (current) drug therapy: Secondary | ICD-10-CM | POA: Diagnosis not present

## 2022-05-21 ENCOUNTER — Ambulatory Visit: Payer: Self-pay | Admitting: *Deleted

## 2022-05-21 NOTE — Telephone Encounter (Signed)
  Chief Complaint: itching possible med reaction Symptoms: severe itching since starting Ozempic. 04/22/22. Reports been on and off of medication for procedures and every time restarts medication, itching worsens. Arms, legs, scalp, eye lids, vaginal area  Frequency: 04/22/22 Pertinent Negatives: Patient denies chest pain no difficulty breathing, no fever. No bleeding  Disposition: [] ED /[x] Urgent Care (no appt availability in office) / [] Appointment(In office/virtual)/ []  [facility]/ [] Home Care/ [] Refused Recommended Disposition /[x] [facility]/ []  Follow-up with PCP Additional Notes:   No available appt until 05/27/22 and patient wants appt this week. Recommended mobile bus  or UC. Unsure patient will go due to recent hernia surgery 05/05/22. Please advise.      Reason for Disposition  Itching is a chronic symptom (recurrent or ongoing AND present > 4 weeks)  Answer Assessment - Initial Assessment Questions 1. DESCRIPTION: "Describe the itching you are having."     Itching all over 2. SEVERITY: "How bad is it?"    - MILD: Doesn't interfere with normal activities.   - MODERATE-SEVERE: Interferes with work, school, sleep, or other activities.      Severe- itching arms legs vaginal eyes lids face  3. SCRATCHING: "Are there any scratch marks? Bleeding?"      no 4. ONSET: "When did this begin?"      After 04/22/22 5. CAUSE: "What do you think is causing the itching?" (ask about swimming pools, pollen, animals, soaps, etc.)     Ozempic  6. OTHER SYMPTOMS: "Do you have any other symptoms?"      No  7. PREGNANCY: "Is there any chance you are pregnant?" "When was your last menstrual period?"     na  Protocols used: Itching - [facility]

## 2022-05-21 NOTE — Telephone Encounter (Signed)
Called pt to advise that she would need to notify Endocrinologist Shamleffer,MD, she states that she has contacted them and they have given some medicine for itching

## 2022-05-27 ENCOUNTER — Encounter: Payer: Self-pay | Admitting: Gastroenterology

## 2022-05-27 DIAGNOSIS — K432 Incisional hernia without obstruction or gangrene: Secondary | ICD-10-CM | POA: Diagnosis not present

## 2022-05-27 DIAGNOSIS — G8918 Other acute postprocedural pain: Secondary | ICD-10-CM | POA: Diagnosis not present

## 2022-05-27 DIAGNOSIS — Z8719 Personal history of other diseases of the digestive system: Secondary | ICD-10-CM | POA: Diagnosis not present

## 2022-05-27 DIAGNOSIS — Z9889 Other specified postprocedural states: Secondary | ICD-10-CM | POA: Diagnosis not present

## 2022-05-29 DIAGNOSIS — F411 Generalized anxiety disorder: Secondary | ICD-10-CM | POA: Diagnosis not present

## 2022-05-29 DIAGNOSIS — F330 Major depressive disorder, recurrent, mild: Secondary | ICD-10-CM | POA: Diagnosis not present

## 2022-06-04 ENCOUNTER — Telehealth: Payer: Self-pay

## 2022-06-04 DIAGNOSIS — Z794 Long term (current) use of insulin: Secondary | ICD-10-CM

## 2022-06-04 DIAGNOSIS — E1142 Type 2 diabetes mellitus with diabetic polyneuropathy: Secondary | ICD-10-CM

## 2022-06-04 MED ORDER — FREESTYLE LITE TEST VI STRP: ORAL_STRIP | 3 refills | Status: DC

## 2022-06-04 MED ORDER — FREESTYLE LANCETS MISC: 3 refills | Status: DC

## 2022-06-04 MED ORDER — HYDROCORTISONE 0.5 % EX CREA: 1.0000 | TOPICAL_CREAM | Freq: Two times a day (BID) | CUTANEOUS | 0 refills | Status: DC

## 2022-06-04 NOTE — Telephone Encounter (Signed)
Eustaquio Maize B1 hour ago (10:40 AM)   KS Patient is calling to say that she is still having the itching from last Ozempic dose and wants to know how long this could last.  Patient has been taking the Benadryl.  Also patient needs all supplies for the Freestyle kit.  Patient uses   [facility] [place], [place] [address] AT [place] (Ph: [phone])         Note

## 2022-06-04 NOTE — Telephone Encounter (Signed)
Patient advised and will discuss further on Friday appointment

## 2022-06-04 NOTE — Telephone Encounter (Signed)
Addressed in new telephone encounter.

## 2022-06-04 NOTE — Telephone Encounter (Signed)
Patient is calling to say that she is still having the itching from last Ozempic dose and wants to know how long this could last.  Patient has been taking the Benadryl.  Also patient needs all supplies for the Freestyle kit.  Patient uses   [facility] [id number] - [place], [place] [address] AT [place] (Ph: [phone])

## 2022-06-06 ENCOUNTER — Encounter: Payer: Self-pay | Admitting: Internal Medicine

## 2022-06-06 ENCOUNTER — Ambulatory Visit (INDEPENDENT_AMBULATORY_CARE_PROVIDER_SITE_OTHER): Payer: Medicare Other | Admitting: Internal Medicine

## 2022-06-06 ENCOUNTER — Telehealth: Payer: Self-pay | Admitting: Internal Medicine

## 2022-06-06 VITALS — BP 124/76 | HR 62 | Ht 62.5 in | Wt 161.0 lb

## 2022-06-06 DIAGNOSIS — E1122 Type 2 diabetes mellitus with diabetic chronic kidney disease: Secondary | ICD-10-CM | POA: Diagnosis not present

## 2022-06-06 DIAGNOSIS — N1831 Chronic kidney disease, stage 3a: Secondary | ICD-10-CM

## 2022-06-06 DIAGNOSIS — L299 Pruritus, unspecified: Secondary | ICD-10-CM

## 2022-06-06 DIAGNOSIS — E1142 Type 2 diabetes mellitus with diabetic polyneuropathy: Secondary | ICD-10-CM | POA: Diagnosis not present

## 2022-06-06 DIAGNOSIS — Z794 Long term (current) use of insulin: Secondary | ICD-10-CM | POA: Diagnosis not present

## 2022-06-06 MED ORDER — DEXCOM G7 SENSOR MISC: 1.0000 | 3 refills | Status: AC

## 2022-06-06 MED ORDER — INSULIN ASPART PROT & ASPART (70-30 MIX) 100 UNIT/ML PEN: PEN_INJECTOR | SUBCUTANEOUS | 3 refills | Status: DC

## 2022-06-06 MED ORDER — INSULIN PEN NEEDLE 32G X 4 MM MISC: 1.0000 | Freq: Two times a day (BID) | 3 refills | Status: DC

## 2022-06-06 MED ORDER — DEXCOM G7 SENSOR MISC: 1.0000 | 3 refills | Status: DC

## 2022-06-06 MED ORDER — ONETOUCH VERIO VI STRP: 1.0000 | ORAL_STRIP | Freq: Two times a day (BID) | 3 refills | Status: DC

## 2022-06-06 MED ORDER — EMPAGLIFLOZIN 25 MG PO TABS: 25.0000 mg | ORAL_TABLET | Freq: Every day | ORAL | 3 refills | Status: DC

## 2022-06-06 MED ORDER — ONETOUCH VERIO W/DEVICE KIT: 1.0000 | PACK | Freq: Every day | 0 refills | Status: DC

## 2022-06-06 NOTE — Telephone Encounter (Signed)
Pharmacy contacted and advised PA was submitted and denied back in February for Dexcom due to no documented comprehensive diabetes education program. Denial was received and clinical notes were faxed over from her diabetes and nutrition visit by the prior auth team but it was from 2018. No follow up documented regarding faxed education OV notes. Pt may need a referral to education and we can resubmit the PA. It does look like Dexcom is preferred CGM for pt's plan with PA.

## 2022-06-06 NOTE — Progress Notes (Signed)
Name: Courtney [name]  Age/ Sex: 67 y.o., female   MRN/ DOB: [id number], 06/11/1955     PCP: Rema Fendt, NP   Reason for Endocrinology Evaluation: Type 2 Diabetes Mellitus  Initial Endocrine Consultative Visit: 06/20/2020    PATIENT IDENTIFIER: [name] is a 67 y.o. female with a past medical history of T2DM, MS , HTN and essential tremors. The patient has followed with Endocrinology clinic since 06/20/20 for consultative assistance with management of her diabetes.  DIABETIC HISTORY:  [name] was diagnosed with DM in 2008, Victoza caused nausea, Bydureon . Has been on insulin since 2011. Her hemoglobin A1c has ranged from 7.3% in 2017, peaking at 8.0% in years ago.   On her initial visit to our clinic she had an A1c of 6.4% , she was on Jardiance and novolog mix . She was having recurrent hypoglycemia at the time, we reduced insulin and continued Jardiance    Started Trulicity 08/2021 was stopped by 12/2021 due to complaints of loss of appetite and borborygmi  but opted to go back 04/2022  Ozempic caused skin rash   SUBJECTIVE:   During the last visit (04/08/2022): A1c 7.2%        Today (06/06/2022): [name] is here for a follow up on diabetes management.  She checks her blood sugars daily .   The patient has had hypoglycemia episodes, she is symptomatic with these episodes , as low as 15 mg/dL per pt  which was after  supper.    S/P hernia repair 05/2022 She is  c/o pruritus  that she attributes to Ozempic , which the last dose was 3/14th , she only took 3 pens total . The pruritus is all over her body  No associated rash Patient stated that this has resolved   HOME DIABETES REGIMEN:  Jardiance 25 mg 1 tablet daily  Novolog Mix 20 units with breakfast and 22 units  supper      Statin: no ACE-I/ARB: no Prior Diabetic Education: no   METER DOWNLOAD SUMMARY: unable to download  76 - 198 mg/dL      DIABETIC  COMPLICATIONS: Microvascular complications:  Neuropathy Denies: retinopathy, CKD Last Eye Exam: Completed 02/05/2021  Macrovascular complications:   Denies: CAD, CVA, PVD   HISTORY:  Past Medical History:  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   Bowel obstruction    Depression with anxiety 02/09/2015   mild xanax prn   Diabetes mellitus 07/18/2015   type 2   Diverticulitis    Dysrhythmia    abnormal ekgs worked up   Essential tremor 03/31/2016   Fibromyalgia    MS   Gait difficulty 02/09/2015   Gallstones    GERD (gastroesophageal reflux disease)    HLD (hyperlipidemia)    Hypersomnia 11/09/2015   Hypertension    no meds   Hyperthyroidism    patient states has no problems at present-01/25/2019   Hypothyroidism    patient states no problems at present 01/25/2019   IBS (irritable bowel syndrome)    Insomnia 02/09/2015   Migraines    MS (multiple sclerosis)    Multiple joint pain 03/31/2016   Numbness in both hands 03/18/2016   Other fatigue 02/09/2015   Peptic ulcer    Pneumonia    multiple times.  Last time 2014   PONV (postoperative nausea and vomiting)    Urinary hesitancy 02/09/2015   Vision abnormalities    Past Surgical History:  Past Surgical History:  Procedure Laterality Date   ABDOMINAL HYSTERECTOMY  1976   including oophorectomy, due to uterine cancer   APPENDECTOMY  1974   BIOPSY  01/31/2019   Procedure: BIOPSY;  Surgeon: Napoleon FormNandigam, Kavitha V, MD;  Location: [facility];  Service: Endoscopy;;   CARDIAC CATHETERIZATION     2001[place]2006 [place] Normal per patient   CARPAL TUNNEL RELEASE Left    carpal tunnel surgery   CHOLECYSTECTOMY     2006   COLON SURGERY     [facility] x 2   COLONOSCOPY WITH PROPOFOL N/A 01/31/2019   Procedure: COLONOSCOPY WITH PROPOFOL;  Surgeon: Napoleon FormNandigam, Kavitha V, MD;   Location: [facility];  Service: Endoscopy;  Laterality: N/A;   COLONOSCOPY WITH PROPOFOL N/A 04/24/2022   Procedure: COLONOSCOPY WITH PROPOFOL;  Surgeon: Napoleon FormNandigam, Kavitha V, MD;  Location: [facility];  Service: Gastroenterology;  Laterality: N/A;   EUS N/A 04/09/2017   Procedure: UPPER ENDOSCOPIC ULTRASOUND (EUS) LINEAR;  Surgeon: Rachael FeeJacobs, Daniel P, MD;  Location: [facility];  Service: Endoscopy;  Laterality: N/A;   HERNIA REPAIR     INCISIONAL HERNIA REPAIR N/A 05/05/2022   Procedure: LAPAROSCOPIC VENTRAL HERNA REPAIR WITH MESH;  Surgeon: Fritzi MandesAllen, Shelby L, MD;  [facility];  Service: General;  Laterality: N/A;   INSERTION OF MESH N/A 05/05/2022   Procedure: INSERTION OF MESH;  Surgeon: Fritzi MandesAllen, Shelby L, MD;  Location: [facility];  Service: General;  Laterality: N/A;   MENISCUS REPAIR Left    POLYPECTOMY  01/31/2019   Procedure: POLYPECTOMY;  Surgeon: Napoleon FormNandigam, Kavitha V, MD;  Location: [facility];  Service: Endoscopy;;   POLYPECTOMY  04/24/2022   Procedure: POLYPECTOMY;  Surgeon: Napoleon FormNandigam, Kavitha V, MD;  Location: [facility];  Service: Gastroenterology;;   SMALL INTESTINE SURGERY     toenail removal Right 12/2017   great toenail    Social History:  reports that she quit smoking about 34 years ago. Her smoking use included cigarettes. She has a 1.25 pack-year smoking history. She has never been exposed to tobacco smoke. She has never used smokeless tobacco. She reports that she does not currently use alcohol. She reports that she does not use drugs. Family History:  Family History  Problem Relation Age of Onset   Heart disease Mother    Kidney disease Mother    Diabetes Mother    Other Father [id number]       murdered   Multiple sclerosis Daughter    Multiple sclerosis Other    Diabetes Sister    Alcohol abuse Brother        ETOH and marijuana   Diabetes Brother    Diabetes Sister    Diabetes Brother    ALS Brother      HOME MEDICATIONS: Allergies as of 06/06/2022       Reactions    Ibuprofen Other (See Comments)   Developed a ulcer    Methylprednisolone Anaphylaxis, Shortness Of Breath, Palpitations   Naproxen Other (See Comments)   Developed a ulcer    Nsaids Other (See Comments)   Pt gets Ulcers    Prednisone Anaphylaxis, Shortness Of Breath, Palpitations   Bactrim [sulfamethoxazole-trimethoprim] Other (See Comments)   "blotchiness" redness, face swelling   Biaxin [clarithromycin] Swelling   Sulfa Antibiotics Swelling, Other (See Comments)   Angioedema   Ozempic (0.25 Or 0.5 Mg-dose) [semaglutide(0.25  Or 0.5mg -dos)] Rash        Medication List        Accurate as of June 06, 2022  2:51 PM. If you have any questions, ask your nurse or doctor.          STOP taking these medications    melatonin 5 MG Tabs Stopped by: Scarlette Shorts, MD       TAKE these medications    acetaminophen 500 MG tablet Commonly known as: TYLENOL Take 1,000 mg by mouth every 8 (eight) hours as needed for headache.   ALPRAZolam 1 MG tablet Commonly known as: XANAX Take 0.5-1 mg by mouth 3 (three) times daily as needed for anxiety.   Dexcom G7 Sensor Misc 1 Device by Does not apply route as directed.   Estradiol 10 MCG Tabs vaginal tablet Place 1 tablet (10 mcg total) vaginally at bedtime for 14 days, THEN 1 tablet (10 mcg total) 2 (two) times a week. Start taking on: March 18, 2022   freestyle lancets 1 Device by Misc.(Non-Drug; Combo Route) route daily. USED TO CHECK BLOOD SUGAR TID   FREESTYLE LITE test strip Generic drug: glucose blood USE 1 STRIP IN THE MORNING AND AT BEDTIME AS INSTRUCTED   hydrocortisone cream 0.5 % Apply 1 Application topically 2 (two) times daily.   insulin aspart protamine - aspart (70-30) 100 UNIT/ML FlexPen Commonly known as: NOVOLOG 70/30 MIX Inject 20 Units into the skin daily with breakfast AND 22 Units daily with supper.   Insulin Pen Needle 32G X 4 MM Misc 1 Device by Does not apply route in the morning and at  bedtime.   Jardiance 25 MG Tabs tablet Generic drug: empagliflozin TAKE 1 TABLET DAILY   latanoprost 0.005 % ophthalmic solution Commonly known as: XALATAN Place 1 drop into both eyes at bedtime.   loratadine 10 MG tablet Commonly known as: CLARITIN Take 10 mg by mouth daily as needed for allergies (seasonal).   modafinil 100 MG tablet Commonly known as: PROVIGIL Take 25 mg by mouth daily as needed (Takes to help stay awake due to insomnia, takes at 6a if needed).   Multivitamin Adult Chew Chew 2 each by mouth 2 (two) times a week.   naloxone 4 MG/0.1ML Liqd nasal spray kit Commonly known as: NARCAN Place 1 spray into the nose daily as needed (overdose).   ondansetron 4 MG tablet Commonly known as: ZOFRAN Take 1 tablet (4 mg total) by mouth every 6 (six) hours as needed for nausea or vomiting. Prior to colonoscopy prep   oxyCODONE 15 MG immediate release tablet Commonly known as: ROXICODONE Take 15 mg by mouth 5 (five) times daily as needed for pain.   pantoprazole 40 MG tablet Commonly known as: PROTONIX Take 1 tablet (40 mg total) by mouth daily.   terconazole 0.8 % vaginal cream Commonly known as: TERAZOL 3 Place 1 applicator vaginally at bedtime.         OBJECTIVE:   Vital Signs: BP 124/76   Pulse 62   Ht 5' 2.5" (1.588 m)   Wt 161 lb (73 kg)   SpO2 97%   BMI 28.98 kg/m   Wt Readings from Last 3 Encounters:  06/06/22 161 lb (73 kg)  05/05/22 160 lb (72.6 kg)  05/02/22 164 lb 12.8 oz (74.8 kg)     Exam: General: Pt appears well and is in NAD  Extremities: No pretibial edema.   Neuro: MS is good with appropriate affect, pt is alert and Ox3  DM Foot Exam 06/06/2022   The skin of the feet is intact without sores or ulcerations. The pedal pulses are 2+ on right and 2+ on left. The sensation is intact to a screening 5.07, 10 gram monofilament bilaterally    DATA REVIEWED:  Lab Results  Component Value Date   HGBA1C 7.2 (A) 04/08/2022    HGBA1C 7.2 (A) 12/05/2021   HGBA1C 6.8 (A) 08/05/2021     ASSESSMENT / PLAN / RECOMMENDATIONS:   1) Type 2 Diabetes Mellitus, Optimally controlled, With neuropathic and CKD III complications - Most recent A1c of 7.2 %. Goal A1c < 7.0 %.    -A1c remains stable -She continues to complain about glucose instability, she believes that she needs more insulin but at the same time she stated that she had a BG reading of 15 mg/dL ????,  This episode happened after eating [place] food, the patient also admits this reading does not make sense -She brought her meter today but unfortunately it is the incorrect dates , One Touch Verio meter and extra strips were sent to [facility] - I again did explain to the patient that with Insulin  Mix it is difficult to keep her BG's stable, we have discussed in the past multiple daily injections to optimize glucose control but she had opted to wait on this -She attributed pruritus to Ozempic -I initially thought that she continues with pruritus and was going to suggest discontinuing the Jardiance but she eventually stated that she does not have the pruritus anymore so we opted to remain on the Jardiance -She did endorse certain side effects to Trulicity, in hindsight she believes this is more due to ventral hernia -I sent a prescription for Dexcom in February to [facility], patient states she has not received this and [facility] told her they did not receive the prescription, I did send another prescription today, I do believe the Dexcom will help improve her diabetes care -Based on hypoglycemia of 15 mg/DL after supper, I am going to reduce her insulin at suppertime, patient again tells me that she believes she needs higher doses, but I did explain to the patient it is difficult for me to come up with the right dose of insulin with no proper glucose data and our priority is to the risk of hypoglycemia so I am going to reduce her evening dose of insulin until  further data is available    MEDICATIONS: - Continue Jardiance 25 mg 1 tablet daily  - Decrease  Novolog 20  units with breakfast and 20 units with  supper     EDUCATION / INSTRUCTIONS: BG monitoring instructions: Patient is instructed to check her blood sugars 2 times a day, breakfast and supper. Call [facility] if: BG persistently < 70  I reviewed the Rule of 15 for the treatment of hypoglycemia in detail with the patient. Literature supplied.   2) Diabetic complications:  Eye: Does not have known diabetic retinopathy.  Neuro/ Feet: Does  have known diabetic peripheral neuropathy .  Renal: Patient does not have known baseline CKD. She   is not on an ACEI/ARB at present.    3) Pruritus:  -Patient attributes this to Ozempic -She stated that this has resolved -I did explain to the patient that I received a message 2 days ago stating that she still has pruritus, but the patient tells me this was a miscommunication with the staff member    4) Lipids: Patient is has been off statins  due to severe cramps. LDL 89 mg/dL during labs 08/3815.  In the past she has declined statin therapy  due to cramps     F/U in 6 months    I spent 30 minutes preparing to see the patient by review of recent labs, imaging and procedures, obtaining and reviewing separately obtained history, communicating with the patient, ordering medications, and documenting clinical information in the EHR including the differential Dx, treatment, and any further evaluation and other management    Signed electronically by: Lyndle Herrlich, MD  [facility]  [facility] [address] [place] [place], [place] [address] Phone: [phone] FAX: [phone]   CC: Rema Fendt, NP [address] [place] [address] Phone: [phone]  Fax: [phone]  Return to Endocrinology clinic as below: No future appointments.

## 2022-06-06 NOTE — Telephone Encounter (Signed)
Lyla Son,   When you get back next week, can you please check on the dexcom through [facility], I sent it in February but the pt states they told her, they do not have it, I also sent it today    Thanks

## 2022-06-06 NOTE — Patient Instructions (Addendum)
-   Continue  Jardiance 25 mg 1 tablet daily  - Decrease   Novolog Mix 20 units with breakfast and 20 units with supper      HOW TO TREAT LOW BLOOD SUGARS (Blood sugar LESS THAN 70 MG/DL) Please follow the RULE OF 15 for the treatment of hypoglycemia treatment (when your (blood sugars are less than 70 mg/dL)   STEP 1: Take 15 grams of carbohydrates when your blood sugar is low, which includes:  3-4 GLUCOSE TABS  OR 3-4 OZ OF JUICE OR REGULAR SODA OR ONE TUBE OF GLUCOSE GEL    STEP 2: RECHECK blood sugar in 15 MINUTES STEP 3: If your blood sugar is still low at the 15 minute recheck --> then, go back to STEP 1 and treat AGAIN with another 15 grams of carbohydrates.

## 2022-06-09 ENCOUNTER — Telehealth: Payer: Self-pay | Admitting: Pharmacy Technician

## 2022-06-09 ENCOUNTER — Other Ambulatory Visit: Payer: Self-pay | Admitting: Internal Medicine

## 2022-06-09 ENCOUNTER — Other Ambulatory Visit (HOSPITAL_COMMUNITY): Payer: Self-pay

## 2022-06-09 DIAGNOSIS — N1831 Chronic kidney disease, stage 3a: Secondary | ICD-10-CM

## 2022-06-09 NOTE — Telephone Encounter (Signed)
My apologies. They did not respond as I expected them to. I called to follow up and was told a new PA would have to be requested, since they had already closed that one with the denial. I sent a new request through covermymeds, but she also made it sound like she was faxing a form to be filled out. I'll reply to that with a fax as well if I do not receive an approval from the electronic request first.

## 2022-06-09 NOTE — Telephone Encounter (Signed)
Pharmacy Patient Advocate Encounter   Received notification from Pt calls msgs for follow up of previous request for Dexcom G7 after notes abt diabetes education was submitted. We did not receive anything, so I called [phone] to find out that they had closed that PA request when they sent the denial, so they did not re-review when I sent the additional notes. She opened a new case. [id number] - she may have also faxed a form for the request. Will fill it out and return it with the needed documentation, if the below mentioned electronic request isn't approved first.    PA submitted on 06/09/22 to (ins) [facility] via [employer] or [facility]) confirmation # [id number]  PA Case ID #: [id number] Status is pending

## 2022-06-09 NOTE — Telephone Encounter (Signed)
Faxed form, office notes and diabetes education notes and pt's msg willing to use CGM.

## 2022-06-10 ENCOUNTER — Encounter: Payer: Self-pay | Admitting: Internal Medicine

## 2022-06-10 NOTE — Telephone Encounter (Signed)
Disregard the message that was sent about authorization . Patient has to be trained on Dexcom before they will cover it.

## 2022-06-10 NOTE — Telephone Encounter (Signed)
Pharmacy Patient Advocate Encounter  Received notification from [facility] that the request for prior authorization for Dexcom has been denied.    Letter indexed to pt's chart.

## 2022-06-11 ENCOUNTER — Encounter: Payer: Self-pay | Admitting: Internal Medicine

## 2022-06-11 ENCOUNTER — Encounter: Payer: Medicare Other | Attending: Internal Medicine | Admitting: Nutrition

## 2022-06-11 NOTE — Patient Instructions (Signed)
Change sensor every 10 days Call help line if questions or problems.

## 2022-06-11 NOTE — Progress Notes (Signed)
Patient was trained on the use of the DexcomG7 sensors.  Readings are going to her phone. Clarity app was also downloaded and linked to [facility].  Discussed difference between sensor readings and blood sugar readings. She was shown how to apply sensor and enter the code into her phone.  She had no final questions.  Lot #:[id number], exp.8/24.

## 2022-06-12 ENCOUNTER — Telehealth: Payer: Self-pay | Admitting: Dietician

## 2022-06-12 NOTE — Telephone Encounter (Signed)
Returned patient call.  She was trained on the Dexcom G7 and has questions regarding this.  Connection is failing at times.  Stated that this is common with the G7 and it will reconnect most of the time without doing anything and will auto fill the data.  She was concerned that the CGM was not working but I discussed it was as she is getting current sensor readings.  She is worried that the CGM will expire before she can get a refill due to a prior authorization need.  Discussed that we have samples if needed.  Reviewed her report in [facility] and discussed.  No further questions at this time.  Oran Rein, RD, LDN, CDCES

## 2022-06-13 NOTE — Telephone Encounter (Signed)
Received fax that a new PA form had to be filled out. They did not re-review with the info I sent.  PA submitted on 06/13/22 to (ins) [facility] via CoverMyMeds NEW Key or (Medicaid) confirmation # [id number] Status is pending

## 2022-06-13 NOTE — Telephone Encounter (Signed)
Received fax that a new PA form had to be filled out. They did not re-review with the info I sent.   PA submitted on 06/13/22 to (ins) [facility] via [employer] Key or (Medicaid) confirmation # [id number] PA Case ID: [id number] Status is pending

## 2022-06-17 ENCOUNTER — Telehealth: Payer: Self-pay

## 2022-06-17 ENCOUNTER — Encounter: Payer: Self-pay | Admitting: Internal Medicine

## 2022-06-17 NOTE — Telephone Encounter (Signed)
Patient states that her blood sugars have been high and would like you to take a look at her Dexcom.  She doesn't feel like she know what she is doing and that her sugar have went up. Waiting to be scheduled with diabetic educator.

## 2022-06-17 NOTE — Telephone Encounter (Signed)
Vm left and mychart message sent  

## 2022-06-17 NOTE — Telephone Encounter (Signed)
PA has been DENIED. Denial letter has been attached in patients documents.  

## 2022-06-18 ENCOUNTER — Encounter: Payer: Self-pay | Admitting: Internal Medicine

## 2022-06-18 ENCOUNTER — Telehealth: Payer: Self-pay | Admitting: Nutrition

## 2022-06-18 DIAGNOSIS — G47429 Narcolepsy in conditions classified elsewhere without cataplexy: Secondary | ICD-10-CM | POA: Diagnosis not present

## 2022-06-18 DIAGNOSIS — E663 Overweight: Secondary | ICD-10-CM | POA: Diagnosis not present

## 2022-06-18 DIAGNOSIS — K5903 Drug induced constipation: Secondary | ICD-10-CM | POA: Diagnosis not present

## 2022-06-18 DIAGNOSIS — T402X5A Adverse effect of other opioids, initial encounter: Secondary | ICD-10-CM | POA: Diagnosis not present

## 2022-06-18 DIAGNOSIS — M545 Low back pain, unspecified: Secondary | ICD-10-CM | POA: Diagnosis not present

## 2022-06-18 DIAGNOSIS — K432 Incisional hernia without obstruction or gangrene: Secondary | ICD-10-CM | POA: Diagnosis not present

## 2022-06-18 DIAGNOSIS — Z8719 Personal history of other diseases of the digestive system: Secondary | ICD-10-CM | POA: Diagnosis not present

## 2022-06-18 DIAGNOSIS — Z9889 Other specified postprocedural states: Secondary | ICD-10-CM | POA: Diagnosis not present

## 2022-06-18 DIAGNOSIS — Z9181 History of falling: Secondary | ICD-10-CM | POA: Diagnosis not present

## 2022-06-18 DIAGNOSIS — Z79899 Other long term (current) drug therapy: Secondary | ICD-10-CM | POA: Diagnosis not present

## 2022-06-18 DIAGNOSIS — F3342 Major depressive disorder, recurrent, in full remission: Secondary | ICD-10-CM | POA: Diagnosis not present

## 2022-06-18 DIAGNOSIS — E1140 Type 2 diabetes mellitus with diabetic neuropathy, unspecified: Secondary | ICD-10-CM | POA: Diagnosis not present

## 2022-06-18 NOTE — Telephone Encounter (Signed)
Pt. Left message on my voicemail asking to be called back about her blood sugar readings. She wishes that you call her on the above number.  The other one does not ring through to her voicemail.

## 2022-06-19 ENCOUNTER — Encounter: Payer: Self-pay | Admitting: Internal Medicine

## 2022-06-19 ENCOUNTER — Ambulatory Visit (INDEPENDENT_AMBULATORY_CARE_PROVIDER_SITE_OTHER): Payer: Medicare Other | Admitting: Internal Medicine

## 2022-06-19 ENCOUNTER — Telehealth: Payer: Self-pay | Admitting: Internal Medicine

## 2022-06-19 VITALS — BP 122/78 | HR 85 | Ht 62.5 in | Wt 159.0 lb

## 2022-06-19 DIAGNOSIS — E1122 Type 2 diabetes mellitus with diabetic chronic kidney disease: Secondary | ICD-10-CM

## 2022-06-19 DIAGNOSIS — E1142 Type 2 diabetes mellitus with diabetic polyneuropathy: Secondary | ICD-10-CM

## 2022-06-19 DIAGNOSIS — L299 Pruritus, unspecified: Secondary | ICD-10-CM

## 2022-06-19 DIAGNOSIS — N1831 Chronic kidney disease, stage 3a: Secondary | ICD-10-CM | POA: Diagnosis not present

## 2022-06-19 DIAGNOSIS — Z794 Long term (current) use of insulin: Secondary | ICD-10-CM

## 2022-06-19 LAB — POCT GLYCOSYLATED HEMOGLOBIN (HGB A1C): Hemoglobin A1C: 6.7 % — AB (ref 4.0–5.6)

## 2022-06-19 MED ORDER — FREESTYLE LITE W/DEVICE KIT: 1.0000 | PACK | Freq: Every day | 0 refills | Status: DC

## 2022-06-19 MED ORDER — FREESTYLE LITE TEST VI STRP: 1.0000 | ORAL_STRIP | Freq: Four times a day (QID) | 3 refills | Status: DC

## 2022-06-19 MED ORDER — INSULIN PEN NEEDLE 32G X 4 MM MISC: 1.0000 | Freq: Four times a day (QID) | 3 refills | Status: DC

## 2022-06-19 MED ORDER — LANTUS SOLOSTAR 100 UNIT/ML ~~LOC~~ SOPN: 12.0000 [IU] | PEN_INJECTOR | Freq: Every day | SUBCUTANEOUS | 3 refills | Status: DC

## 2022-06-19 MED ORDER — NOVOLOG FLEXPEN 100 UNIT/ML ~~LOC~~ SOPN: PEN_INJECTOR | SUBCUTANEOUS | 3 refills | Status: DC

## 2022-06-19 NOTE — Telephone Encounter (Signed)
This is a follow up to the PA from 06/09/2022 for Dexcom     The pt received CGM training on 06/11/2022 , as that was the missing Piece to approving her DEXCOM   I also changes her insulin regimen  today , in cas that's needed    Thank you

## 2022-06-19 NOTE — Patient Instructions (Addendum)
STOP Novolog Mix  Start Lantus 12 units once daily at night  Start Novolog 10 units before each meal  Novolog correctional insulin: ADD extra units on insulin to your meal-time Novolog dose if your blood sugars are higher than 170. Use the scale below to help guide you before each meal   Blood sugar before meal Number of units to inject  Less than 170 0 unit  171 -  210 1 units  211 -  250 2 units  251 -  290 3 units  291 -  330 4 units  331 -  370 5 units  371 -  410 6 units     HOW TO TREAT LOW BLOOD SUGARS (Blood sugar LESS THAN 70 MG/DL) Please follow the RULE OF 15 for the treatment of hypoglycemia treatment (when your (blood sugars are less than 70 mg/dL)   STEP 1: Take 15 grams of carbohydrates when your blood sugar is low, which includes:  3-4 GLUCOSE TABS  OR 3-4 OZ OF JUICE OR REGULAR SODA OR ONE TUBE OF GLUCOSE GEL    STEP 2: RECHECK blood sugar in 15 MINUTES STEP 3: If your blood sugar is still low at the 15 minute recheck --> then, go back to STEP 1 and treat AGAIN with another 15 grams of carbohydrates.

## 2022-06-19 NOTE — Progress Notes (Signed)
Name: Courtney [name]  Age/ Sex: 67 y.o., female   MRN/ DOB: [id number], 09-Nov-1955     PCP: Rema Fendt, NP   Reason for Endocrinology Evaluation: Type 2 Diabetes Mellitus  Initial Endocrine Consultative Visit: 06/20/2020    PATIENT IDENTIFIER: Courtney [name] is a 67 y.o. female with a past medical history of T2DM, MS , HTN and essential tremors. The patient has followed with Endocrinology clinic since 06/20/20 for consultative assistance with management of her diabetes.  DIABETIC HISTORY:  Courtney [name] was diagnosed with DM in 2008, Victoza caused nausea, Bydureon . Has been on insulin since 2011. Her hemoglobin A1c has ranged from 7.3% in 2017, peaking at 8.0% in years ago.   On her initial visit to our clinic she had an A1c of 6.4% , she was on Jardiance and novolog mix . She was having recurrent hypoglycemia at the time, we reduced insulin and continued Jardiance    Started Trulicity 08/2021 was stopped by 12/2021 due to complaints of loss of appetite and borborygmi  but opted to go back 04/2022  Ozempic caused pruritus but this continued despite being off Ozempic for over 6 weeks, stopped Jardiance 06/2022 to see if this would help with pruritus     SUBJECTIVE:   During the last visit (06/06/2022): A1c 7.2%        Today (06/19/2022): Courtney [name] is here for a follow up on diabetes management.  She checks her blood sugars multiple times daily through CGM.  The patient was noted with hypoglycemia, she is symptomatic with the episode.   She is continues to complain of pruritus  that she initially attributed to Ozempic , which the last dose was 3/14th , she only took 3 doses total . The pruritus is all over her body .  We then discussed discontinuing Jardiance,, which she discontinued for 1 day with resolution of pruritus, she resumed Jardiance the next day, and now she is still having pruritus again No associated rash She is very frustrated with her  glucose reading specially now that she is able to see those readings in real-time through [facility]  She also brought her glucose meter, but the dates are incorrect She does see a psychiatrist once a month   She is s/p Hernia repair 05/2022 She follows with behavioral health for generalized anxiety  HOME DIABETES REGIMEN:  Novolog Mix 20 units with breakfast and 20 units  supper  Jardiance 25 mg daily   Statin: no ACE-I/ARB: no Prior Diabetic Education: no   CONTINUOUS GLUCOSE MONITORING RECORD INTERPRETATION    Dates of Recording: 4/5-4/18/2024  Sensor description:dexcom  Results statistics:   CGM use % of time 50  Average and SD 154/45  Time in range      77  %  % Time Above 180 19  % Time above 250 3  % Time Below target <1   Glycemic patterns summary: BG's are optimal at night, trend up during the day   Hyperglycemic episodes  postprandial   Hypoglycemic episodes occurred during the day and night without a pattern   Overnight periods:  mostly within range       DIABETIC COMPLICATIONS: Microvascular complications:  Neuropathy Denies: retinopathy, CKD Last Eye Exam: Completed 02/05/2021  Macrovascular complications:   Denies: CAD, CVA, PVD   HISTORY:  Past Medical History:  Past Medical History:  Diagnosis Date   Abnormal ECG 07/18/2015   Arthritis    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   Bowel obstruction    Depression with anxiety 02/09/2015   mild xanax prn   Diabetes mellitus 07/18/2015   type 2   Diverticulitis    Dysrhythmia    abnormal ekgs worked up   Essential tremor 03/31/2016   Fibromyalgia    MS   Gait difficulty 02/09/2015   Gallstones    GERD (gastroesophageal reflux disease)    HLD (hyperlipidemia)    Hypersomnia 11/09/2015   Hypertension    no meds   Hyperthyroidism    patient states has no problems at present-01/25/2019   Hypothyroidism    patient  states no problems at present 01/25/2019   IBS (irritable bowel syndrome)    Insomnia 02/09/2015   Migraines    MS (multiple sclerosis)    Multiple joint pain 03/31/2016   Numbness in both hands 03/18/2016   Other fatigue 02/09/2015   Peptic ulcer    Pneumonia    multiple times. Last time 2014   PONV (postoperative nausea and vomiting)    Urinary hesitancy 02/09/2015   Vision abnormalities    Past Surgical History:  Past Surgical History:  Procedure Laterality Date   ABDOMINAL HYSTERECTOMY  1976   including oophorectomy, due to uterine cancer   APPENDECTOMY  1974   BIOPSY  01/31/2019   Procedure: BIOPSY;  Surgeon: Napoleon Form, MD;  Location: [facility];  Service: Endoscopy;;   CARDIAC CATHETERIZATION     2001/2006 In [place]. Normal per patient   CARPAL TUNNEL RELEASE Left    carpal tunnel surgery   CHOLECYSTECTOMY     2006   COLON SURGERY     [facility] x 2   COLONOSCOPY WITH PROPOFOL N/A 01/31/2019   Procedure: COLONOSCOPY WITH PROPOFOL;  Surgeon: Napoleon Form, MD;  Location: [facility];  Service: Endoscopy;  Laterality: N/A;   COLONOSCOPY WITH PROPOFOL N/A 04/24/2022   Procedure: COLONOSCOPY WITH PROPOFOL;  Surgeon: Napoleon Form, MD;  Location: [facility];  Service: Gastroenterology;  Laterality: N/A;   EUS N/A 04/09/2017   Procedure: UPPER ENDOSCOPIC ULTRASOUND (EUS) LINEAR;  Surgeon: Rachael Fee, MD;  Location: [facility];  Service: Endoscopy;  Laterality: N/A;   HERNIA REPAIR     INCISIONAL HERNIA REPAIR N/A 05/05/2022   Procedure: LAPAROSCOPIC VENTRAL HERNA REPAIR WITH MESH;  Surgeon: Fritzi Mandes, MD;  Location: [facility] OR;  Service: General;  Laterality: N/A;   INSERTION OF MESH N/A 05/05/2022   Procedure: INSERTION OF MESH;  Surgeon: Fritzi Mandes, MD;  Location: [facility] OR;  Service: General;  Laterality: N/A;   MENISCUS REPAIR Left    POLYPECTOMY  01/31/2019   Procedure: POLYPECTOMY;  Surgeon: Napoleon Form, MD;  Location: [facility];  Service: Endoscopy;;   POLYPECTOMY  04/24/2022   Procedure: POLYPECTOMY;  Surgeon: Napoleon Form, MD;  Location: [facility];  Service: Gastroenterology;;   SMALL INTESTINE SURGERY     toenail removal Right 12/2017   great toenail    Social History:  reports that she quit smoking about 34 years ago. Her smoking use included cigarettes. She has a 1.25 pack-year smoking history. She has never been exposed to tobacco smoke. She has never used smokeless tobacco. She reports that she does not currently use alcohol. She reports that she does not use drugs. Family History:  Family History  Problem Relation Age of Onset   Heart disease Mother    Kidney  disease Mother    Diabetes Mother    Other Father 39       murdered   Multiple sclerosis Daughter    Multiple sclerosis Other    Diabetes Sister    Alcohol abuse Brother        ETOH and marijuana   Diabetes Brother    Diabetes Sister    Diabetes Brother    ALS Brother      HOME MEDICATIONS: Allergies as of 06/19/2022       Reactions   Ibuprofen Other (See Comments)   Developed a ulcer    Methylprednisolone Anaphylaxis, Shortness Of Breath, Palpitations   Naproxen Other (See Comments)   Developed a ulcer    Nsaids Other (See Comments)   Pt gets Ulcers    Prednisone Anaphylaxis, Shortness Of Breath, Palpitations   Bactrim [sulfamethoxazole-trimethoprim] Other (See Comments)   "blotchiness" redness, face swelling   Biaxin [clarithromycin] Swelling   Sulfa Antibiotics Swelling, Other (See Comments)   Angioedema   Ozempic (0.25 Or 0.5 Mg-dose) [semaglutide(0.25 Or 0.5mg -dos)] Rash        Medication List        Accurate as of June 19, 2022 11:59 PM. If you have any questions, ask your nurse or doctor.          STOP taking these medications    insulin aspart protamine - aspart (70-30) 100 UNIT/ML FlexPen Commonly known as: NOVOLOG 70/30 MIX Stopped by: Scarlette Shorts, MD       TAKE these  medications    acetaminophen 500 MG tablet Commonly known as: TYLENOL Take 1,000 mg by mouth every 8 (eight) hours as needed for headache.   ALPRAZolam 1 MG tablet Commonly known as: XANAX Take 0.5-1 mg by mouth 3 (three) times daily as needed for anxiety.   Dexcom G7 Sensor Misc 1 Device by Does not apply route as directed.   empagliflozin 25 MG Tabs tablet Commonly known as: Jardiance Take 1 tablet (25 mg total) by mouth daily.   Estradiol 10 MCG Tabs vaginal tablet Place 1 tablet (10 mcg total) vaginally at bedtime for 14 days, THEN 1 tablet (10 mcg total) 2 (two) times a week. Start taking on: March 18, 2022   freestyle lancets 1 Device by Misc.(Non-Drug; Combo Route) route daily. USED TO CHECK BLOOD SUGAR TID   FREESTYLE LITE test strip Generic drug: glucose blood 1 each by Other route in the morning, at noon, in the evening, and at bedtime. Use as instructed What changed: when to take this Changed by: Scarlette Shorts, MD   FreeStyle Lite w/Device Kit 1 Device by Does not apply route daily in the afternoon.   hydrocortisone cream 0.5 % Apply 1 Application topically 2 (two) times daily.   Insulin Pen Needle 32G X 4 MM Misc 1 Device by Does not apply route in the morning, at noon, in the evening, and at bedtime. What changed: when to take this Changed by: Scarlette Shorts, MD   Lantus SoloStar 100 UNIT/ML Solostar Pen Generic drug: insulin glargine Inject 12 Units into the skin daily. Started by: Scarlette Shorts, MD   latanoprost 0.005 % ophthalmic solution Commonly known as: XALATAN Place 1 drop into both eyes at bedtime.   loratadine 10 MG tablet Commonly known as: CLARITIN Take 10 mg by mouth daily as needed for allergies (seasonal).   modafinil 100 MG tablet Commonly known as: PROVIGIL Take 25 mg by mouth daily as needed (Takes to help stay awake due  to insomnia, takes at 6a if needed).   Multivitamin Adult Chew Chew 2 each by mouth 2  (two) times a week.   naloxone 4 MG/0.1ML Liqd nasal spray kit Commonly known as: NARCAN Place 1 spray into the nose daily as needed (overdose).   NovoLOG FlexPen 100 UNIT/ML FlexPen Generic drug: insulin aspart Max daily 30 units Started by: Scarlette Shorts, MD   ondansetron 4 MG tablet Commonly known as: ZOFRAN Take 1 tablet (4 mg total) by mouth every 6 (six) hours as needed for nausea or vomiting. Prior to colonoscopy prep   oxyCODONE 15 MG immediate release tablet Commonly known as: ROXICODONE Take 15 mg by mouth 5 (five) times daily as needed for pain.   pantoprazole 40 MG tablet Commonly known as: PROTONIX Take 1 tablet (40 mg total) by mouth daily.   terconazole 0.8 % vaginal cream Commonly known as: TERAZOL 3 Place 1 applicator vaginally at bedtime.         OBJECTIVE:   Vital Signs: BP 122/78 (BP Location: Left Arm, Patient Position: Sitting, Cuff Size: Large)   Pulse 85   Ht 5' 2.5" (1.588 m)   Wt 159 lb (72.1 kg)   SpO2 95%   BMI 28.62 kg/m   Wt Readings from Last 3 Encounters:  06/19/22 159 lb (72.1 kg)  06/06/22 161 lb (73 kg)  05/05/22 160 lb (72.6 kg)     Exam: General: Pt appears well and is in NAD  Chest: CTA  Heart: RRR  Extremities: No pretibial edema.   Neuro: MS is good with appropriate affect, pt is alert and Ox3   DM Foot Exam 06/06/2022   The skin of the feet is intact without sores or ulcerations. The pedal pulses are 2+ on right and 2+ on left. The sensation is intact to a screening 5.07, 10 gram monofilament bilaterally    DATA REVIEWED:  Lab Results  Component Value Date   HGBA1C 6.7 (A) 06/19/2022   HGBA1C 7.2 (A) 04/08/2022   HGBA1C 7.2 (A) 12/05/2021     ASSESSMENT / PLAN / RECOMMENDATIONS:   1) Type 2 Diabetes Mellitus, Optimally controlled, With neuropathic and CKD III complications - Most recent A1c of 6.7 %. Goal A1c < 7.0 %.    -Patient continues with glycemic excursions -She is going to schedule an  appointment with CDE, to discuss low carbohydrate diet -She continues with pruritus but no rash, that she initially attributed to Ozempic, I did initially advise her to stop the Jardiance in case this has to do with it, but the patient believes that this may be related to the medications that she took during colonoscopy.  -Opted to remain on Jardiance since her symptoms are mild and overall improving -I have recommended switching from insulin mix to prandial/basal insulin for better control and optimization of glucose -I did explain to the patient the importance of avoiding hypoglycemia, but I also explained to the patient that postprandial hyperglycemia is expected during diabetes, as long as these glucose readings trend down within the next couple hours -She will be started on Lantus as below -She will be started on NovoLog with each meal -She will also be given a correction scale to be used before each meal -She was on Trulicity at some point, and she endorsed nonspecific symptoms to it, in hindsight she believes this had to do with hernia rather than the actual Trulicity  -She has been having difficulty obtaining the Dexcom through her insurance.  [employer] indicated  that they need her to be trained on CGM before they approve it?,  Which was done on June 11, 2022.  [employer], denied the Dexcom again, stating that it MUST  be documented, that the patient would agree to wear the CGM, even though it has been documented in her note that the patient would like the Dexcom and has been wearing it for at least the past 2 weeks to sample supply  MEDICATIONS: - Continue Jardiance 25 mg 1 tablet daily  - Stop NovoLog Mix - Start Lantus 12 units daily -Start NovoLog 10 units 3 times daily before every meal - Start CF: NovoLog (BG -130/40) 3 times daily before every meal      EDUCATION / INSTRUCTIONS: BG monitoring instructions: Patient is instructed to check her blood sugars 2 times a  day, breakfast and supper. Call [facility] if: BG persistently < 70  I reviewed the Rule of 15 for the treatment of hypoglycemia in detail with the patient. Literature supplied.   2) Diabetic complications:  Eye: Does not have known diabetic retinopathy.  Neuro/ Feet: Does  have known diabetic peripheral neuropathy .  Renal: Patient does not have known baseline CKD. She   is not on an ACEI/ARB at present.    3) Pruritus:  -This is tolerable at this time per patient -She initially attributed this to Ozempic, but now believes this may have to do with the medication she took during colonoscopy -I did advise the patient that if pruritus continues or worsens, she may discontinue Jardiance and see how she does with it.  She did discontinue Jardiance for 1 day which she has noted resolved provide is that day, but she opted to resume Jardiance because she was afraid to be without Jardiance and Ozempic at the same time  4) Lipids: Patient is has been off statins due to severe cramps. LDL 89 mg/dL during labs 03/6107.  In the past she has declined statin therapy  due to cramps     F/U in 3 weeks     Signed electronically by: Lyndle Herrlich, MD  [facility]  [facility] [address] [place], [place] [address] Phone: [phone] FAX: [phone]   CC: Rema Fendt, NP [address] [place] [address] Phone: [phone]  Fax: [phone]  Return to Endocrinology clinic as below: Future Appointments  Date Time Provider Department Center  12/09/2022  1:20 PM Mykalah Saari, Konrad Dolores, MD [facility] None

## 2022-06-20 ENCOUNTER — Encounter: Payer: Self-pay | Admitting: Internal Medicine

## 2022-06-20 ENCOUNTER — Other Ambulatory Visit: Payer: Self-pay

## 2022-06-20 DIAGNOSIS — Z79899 Other long term (current) drug therapy: Secondary | ICD-10-CM | POA: Diagnosis not present

## 2022-06-20 MED ORDER — FREESTYLE LITE TEST VI STRP: 1.0000 | ORAL_STRIP | Freq: Four times a day (QID) | 3 refills | Status: DC

## 2022-06-20 MED ORDER — LANTUS SOLOSTAR 100 UNIT/ML ~~LOC~~ SOPN: 12.0000 [IU] | PEN_INJECTOR | Freq: Every day | SUBCUTANEOUS | 0 refills | Status: DC

## 2022-06-20 MED ORDER — NOVOLOG FLEXPEN 100 UNIT/ML ~~LOC~~ SOPN: PEN_INJECTOR | SUBCUTANEOUS | 0 refills | Status: DC

## 2022-06-20 NOTE — Telephone Encounter (Signed)
Spoke with [employer]. They said they want it specifically documented in her chart notes:   "Confirmed with patient that she agrees to wear CGM as directed"  Once that is in there, we will have to resubmit another claim

## 2022-06-20 NOTE — Telephone Encounter (Signed)
Order has been sent to [facility]

## 2022-06-20 NOTE — Telephone Encounter (Signed)
I submitted that information, but it was still denied. Now they it looks like they are saying it's denied due to not having documentation that she's willing to wear it as directed, even though I sent the conversation where she said she wanted to get it. We will follow up to see why they didn't accept that documentation.

## 2022-06-24 ENCOUNTER — Telehealth: Payer: Self-pay | Admitting: Nutrition

## 2022-06-24 DIAGNOSIS — Z794 Long term (current) use of insulin: Secondary | ICD-10-CM

## 2022-06-24 NOTE — Telephone Encounter (Signed)
Patient is picking up her Lantus and Novolog today.  She took her mix insulin this AM before breakfast, and wanted to know if she needs to wait until tomorrow to start the new insulin. She was told that she can start the Novolog tonight before supper, and take the Lantus tonight before bedtime per Dr. Harvel Ricks orders.  She reported good understanding of this.   She is also requesting a referral to speak with the dietitian to see what she should be eating.

## 2022-07-01 ENCOUNTER — Telehealth: Payer: Self-pay | Admitting: Family

## 2022-07-01 NOTE — Telephone Encounter (Signed)
Contacted Courtney [name] to schedule their annual wellness visit. Appointment made for 07/09/22.  Courtney [name] AWV direct phone # [phone])*

## 2022-07-04 DIAGNOSIS — F410 Panic disorder [episodic paroxysmal anxiety] without agoraphobia: Secondary | ICD-10-CM | POA: Diagnosis not present

## 2022-07-04 DIAGNOSIS — F4321 Adjustment disorder with depressed mood: Secondary | ICD-10-CM | POA: Diagnosis not present

## 2022-07-04 DIAGNOSIS — F330 Major depressive disorder, recurrent, mild: Secondary | ICD-10-CM | POA: Diagnosis not present

## 2022-07-04 DIAGNOSIS — F411 Generalized anxiety disorder: Secondary | ICD-10-CM | POA: Diagnosis not present

## 2022-07-08 ENCOUNTER — Encounter: Payer: Self-pay | Admitting: Internal Medicine

## 2022-07-09 ENCOUNTER — Telehealth: Payer: Self-pay

## 2022-07-09 NOTE — Telephone Encounter (Signed)
This nurse called patient for AWV. She stated that she lost her daughter a couple weeks ago and does not feel up to it right now. She asked that we call at another time to reschedule.

## 2022-07-10 ENCOUNTER — Encounter: Payer: Self-pay | Admitting: Internal Medicine

## 2022-07-11 ENCOUNTER — Encounter: Payer: Self-pay | Admitting: Internal Medicine

## 2022-07-23 ENCOUNTER — Other Ambulatory Visit: Payer: Self-pay | Admitting: Family

## 2022-07-23 ENCOUNTER — Encounter: Payer: Self-pay | Admitting: Family

## 2022-07-23 DIAGNOSIS — E663 Overweight: Secondary | ICD-10-CM | POA: Diagnosis not present

## 2022-07-23 DIAGNOSIS — M545 Low back pain, unspecified: Secondary | ICD-10-CM | POA: Diagnosis not present

## 2022-07-23 DIAGNOSIS — K5903 Drug induced constipation: Secondary | ICD-10-CM | POA: Diagnosis not present

## 2022-07-23 DIAGNOSIS — Z9181 History of falling: Secondary | ICD-10-CM | POA: Diagnosis not present

## 2022-07-23 DIAGNOSIS — E1140 Type 2 diabetes mellitus with diabetic neuropathy, unspecified: Secondary | ICD-10-CM | POA: Diagnosis not present

## 2022-07-23 DIAGNOSIS — L299 Pruritus, unspecified: Secondary | ICD-10-CM

## 2022-07-23 DIAGNOSIS — R200 Anesthesia of skin: Secondary | ICD-10-CM

## 2022-07-23 DIAGNOSIS — G47429 Narcolepsy in conditions classified elsewhere without cataplexy: Secondary | ICD-10-CM | POA: Diagnosis not present

## 2022-07-23 DIAGNOSIS — Z79899 Other long term (current) drug therapy: Secondary | ICD-10-CM | POA: Diagnosis not present

## 2022-07-23 DIAGNOSIS — F3342 Major depressive disorder, recurrent, in full remission: Secondary | ICD-10-CM | POA: Diagnosis not present

## 2022-07-23 DIAGNOSIS — T402X5A Adverse effect of other opioids, initial encounter: Secondary | ICD-10-CM | POA: Diagnosis not present

## 2022-07-23 NOTE — Telephone Encounter (Signed)
-   Referral to Podiatry for foot numbness/itching. Expect call within 2 weeks with appointment details.  - Referral to Neurology for tight numbness. Expect call within 2 weeks with appointment details.

## 2022-07-25 DIAGNOSIS — Z79899 Other long term (current) drug therapy: Secondary | ICD-10-CM | POA: Diagnosis not present

## 2022-07-30 ENCOUNTER — Other Ambulatory Visit: Payer: Self-pay | Admitting: Family

## 2022-07-30 DIAGNOSIS — Z9889 Other specified postprocedural states: Secondary | ICD-10-CM

## 2022-07-30 MED ORDER — MUPIROCIN 2 % EX OINT: 1.0000 | TOPICAL_OINTMENT | Freq: Two times a day (BID) | CUTANEOUS | 0 refills | Status: DC

## 2022-07-30 MED ORDER — KETOCONAZOLE 2 % EX CREA: 1.0000 | TOPICAL_CREAM | Freq: Every day | CUTANEOUS | 0 refills | Status: DC

## 2022-07-30 NOTE — Telephone Encounter (Signed)
-   Mupirocin Ointment prescribed. - Ketoconazole Cream prescribed.

## 2022-08-04 DIAGNOSIS — F4321 Adjustment disorder with depressed mood: Secondary | ICD-10-CM | POA: Diagnosis not present

## 2022-08-04 DIAGNOSIS — F410 Panic disorder [episodic paroxysmal anxiety] without agoraphobia: Secondary | ICD-10-CM | POA: Diagnosis not present

## 2022-08-04 DIAGNOSIS — F331 Major depressive disorder, recurrent, moderate: Secondary | ICD-10-CM | POA: Diagnosis not present

## 2022-08-04 DIAGNOSIS — F411 Generalized anxiety disorder: Secondary | ICD-10-CM | POA: Diagnosis not present

## 2022-08-07 ENCOUNTER — Encounter: Payer: Self-pay | Admitting: Dietician

## 2022-08-07 ENCOUNTER — Encounter: Payer: Medicare Other | Attending: Internal Medicine | Admitting: Dietician

## 2022-08-07 VITALS — Ht 62.5 in | Wt 157.0 lb

## 2022-08-07 DIAGNOSIS — Z794 Long term (current) use of insulin: Secondary | ICD-10-CM | POA: Insufficient documentation

## 2022-08-07 DIAGNOSIS — N1831 Chronic kidney disease, stage 3a: Secondary | ICD-10-CM | POA: Insufficient documentation

## 2022-08-07 DIAGNOSIS — E1122 Type 2 diabetes mellitus with diabetic chronic kidney disease: Secondary | ICD-10-CM | POA: Diagnosis not present

## 2022-08-07 NOTE — Progress Notes (Signed)
Diabetes Self-Management Education  Visit Type: First/Initial  Appt. Start Time: 1400 Appt. End Time: 1530  08/11/2022  [name], identified by name and date of birth, is a 67 y.o. female with a diagnosis of Diabetes: Type 2.   ASSESSMENT   Patient is here today alone.  She was last seen 06/2022 for [facility] training. She states that it has been a while since having education and needs a refresher on diabetes. She wonders if she should take her insulin before bed if her blood glucose is well controlled.  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 (discuss low carb diet and insulin management) History includes:  Type 2 Diabetes, CKD stage 3a, MS (2005), HLD, HTN, hypothyroidism, depression, GERD, gastroparesis Medications include:  Novolog:  10 units before breakfast, 6 units before lunch, 8 before dinner, Lantus 12 units q HS, MVI, occasional vitamin D Labs noted to include A1C 6.7% 06/19/2022 decreased from 7.2% 04/08/2022, BUN 14, Creatinine 1.26, Potassium 4.3, GFR 47 04/30/2022 Lipid Panel     Component Value Date/Time   CHOL 197 05/03/2021 1130   TRIG 177 (H) 05/03/2021 1130   HDL 79 05/03/2021 1130   CHOLHDL 2.5 05/03/2021 1130   LDLCALC 89 05/03/2021 1130   LABVLDL 29 05/03/2021 1130   CGM:  Dexcom G7  CGM Results from download: 08/07/2022  % Time CGM active:   100 %   (Goal >70%)  Average glucose:   150 mg/dL for 14 days  Glucose management indicator:   6.9 %  Time in range (70-180 mg/dL):   83 %   (Goal >16%)  Time High (181-250 mg/dL):   16 %   (Goal < 10%)  Time Very High (>250 mg/dL):    <1 %   (Goal < 5%)  Time Low (54-69 mg/dL):   <1 %   (Goal <9%)  Time Very Low (<54 mg/dL):   <1 %   (Goal <6%)   Weight hx: 62.5" 158 lbs Overall weight stable.  Patient lives with her husband.  She does the shopping and cooking. Daughter passed away about 1 month ago.  Increased stress regarding family dynamics related to this.  She states that she was  journaling her intake prior to this. She has a stationary bike, will walk at times, steps, housekeeping. Sleep - pour.  Problems falling asleep and wakes easily.  Height 5' 2.5" (1.588 m), weight 157 lb (71.2 kg). Body mass index is 28.26 kg/m.   Diabetes Self-Management Education - 08/07/22 1409       Visit Information   Visit Type First/Initial      Initial Visit   Diabetes Type Type 2    Date Diagnosed --   over 20 years ago   Are you currently following a meal plan? Yes    What type of meal plan do you follow? carb counting    Are you taking your medications as prescribed? Yes      Health Coping   How would you rate your overall health? Fair      Psychosocial Assessment   Patient Belief/Attitude about Diabetes Other (comment)   "Hate it"   Self-care barriers None    Self-management support Doctor's office    Other persons present Patient    Patient Concerns Nutrition/Meal planning    Special Needs None    Preferred Learning Style No preference indicated    Learning Readiness Ready    How often do you need to have someone help  you when you read instructions, pamphlets, or other written materials from your doctor or pharmacy? 1 - Never    What is the last grade level you completed in school? 12      Pre-Education Assessment   Patient understands the diabetes disease and treatment process. Needs Review    Patient understands incorporating nutritional management into lifestyle. Needs Review    Patient undertands incorporating physical activity into lifestyle. Needs Review    Patient understands using medications safely. Needs Review    Patient understands monitoring blood glucose, interpreting and using results Needs Review    Patient understands prevention, detection, and treatment of acute complications. Needs Review    Patient understands prevention, detection, and treatment of chronic complications. Needs Review    Patient understands how to develop strategies to  address psychosocial issues. Needs Review    Patient understands how to develop strategies to promote health/change behavior. Needs Review      Complications   Last HgB A1C per patient/outside source 6.7 %   06/19/2022 decreased from 7.2% 04/08/22   How often do you check your blood sugar? > 4 times/day    Fasting Blood glucose range (mg/dL) 16-109    Postprandial Blood glucose range (mg/dL) 60-454;098-119    Number of hypoglycemic episodes per month 1    Can you tell when your blood sugar is low? Yes    What do you do if your blood sugar is low? glucose tabs    Have you had a dilated eye exam in the past 12 months? Yes    Have you had a dental exam in the past 12 months? Yes    Are you checking your feet? Yes    How many days per week are you checking your feet? 4      Dietary Intake   Breakfast 1/2-1 banana or 1/2 cup applesauce   6-8 am   Lunch 2 eggs, 1 slice toast or toast with PB   before noon   Snack (afternoon) 1/2 class coconut milk, 1/2 cereal bar, few cherries   3 pm   Dinner rice, fried chicken, collard greens   6 pm   Snack (evening) watermelon, handful of pretzels   1-5 am   Beverage(s) water, unsweetened decaf tea, diet sparkling water, occasional diet soda      Activity / Exercise   Activity / Exercise Type Light (walking / raking leaves)    How many days per week do you exercise? 1    How many minutes per day do you exercise? 15    Total minutes per week of exercise 15      Patient Education   Previous Diabetes Education Yes (please comment)   over 20 years ago   Disease Pathophysiology Definition of diabetes, type 1 and 2, and the diagnosis of diabetes;Explored patient's options for treatment of their diabetes    Healthy Eating Role of diet in the treatment of diabetes and the relationship between the three main macronutrients and blood glucose level;Food label reading, portion sizes and measuring food.;Carbohydrate counting;Meal options for control of blood glucose  level and chronic complications.    Being Active Role of exercise on diabetes management, blood pressure control and cardiac health.;Helped patient identify appropriate exercises in relation to his/her diabetes, diabetes complications and other health issue.    Medications Reviewed patients medication for diabetes, action, purpose, timing of dose and side effects.    Monitoring Taught/evaluated CGM (comment);Yearly dilated eye exam;Daily foot exams   showed patient  more tips on using the Dexcom   Acute complications Taught prevention, symptoms, and  treatment of hypoglycemia - the 15 rule.    Diabetes Stress and Support Identified and addressed patients feelings and concerns about diabetes;Worked with patient to identify barriers to care and solutions      Individualized Goals (developed by patient)   Nutrition General guidelines for healthy choices and portions discussed;Carb counting    Physical Activity Exercise 5-7 days per week;30 minutes per day    Medications take my medication as prescribed    Monitoring  Consistenly use CGM    Problem Solving Eating Pattern;Addressing barriers to behavior change    Reducing Risk examine blood glucose patterns;do foot checks daily;treat hypoglycemia with 15 grams of carbs if blood glucose less than 70mg /dL    Health Coping Ask for help with psychological, social, or emotional issues      Post-Education Assessment   Patient understands the diabetes disease and treatment process. Comprehends key points    Patient understands incorporating nutritional management into lifestyle. Needs Review    Patient undertands incorporating physical activity into lifestyle. Comprehends key points    Patient understands using medications safely. Comphrehends key points    Patient understands monitoring blood glucose, interpreting and using results Comprehends key points    Patient understands prevention, detection, and treatment of acute complications. Comprehends key  points    Patient understands prevention, detection, and treatment of chronic complications. Comprehends key points    Patient understands how to develop strategies to address psychosocial issues. Comprehends key points    Patient understands how to develop strategies to promote health/change behavior. Needs Review      Outcomes   Expected Outcomes Demonstrated interest in learning. Expect positive outcomes    Future DMSE 2 months    Program Status Not Completed             Individualized Plan for Diabetes Self-Management Training:   Learning Objective:  Patient will have a greater understanding of diabetes self-management. Patient education plan is to attend individual and/or group sessions per assessed needs and concerns.   Plan:   Patient Instructions  Eat small portions of carbohydrates with a meal.  You are taking insulin with your meals and need this to prevent a low or not take as much meal time insulin.   Choose low fat meals and snacks overall. History of gastroparesis causes fat to be digested very slowly.   Avoid carbohydrates with a snack.  Aim to set a meal schedule.  Stay active most 30 minutes per day.  Expected Outcomes:  Demonstrated interest in learning. Expect positive outcomes  Education material provided: Meal plan card, My Plate, and Snack sheet  If problems or questions, patient to contact team via:  Phone  Future DSME appointment: 2 months

## 2022-08-07 NOTE — Patient Instructions (Addendum)
Eat small portions of carbohydrates with a meal.  You are taking insulin with your meals and need this to prevent a low or not take as much meal time insulin.   Choose low fat meals and snacks overall. History of gastroparesis causes fat to be digested very slowly.   Avoid carbohydrates with a snack. Take your medication consistently as prescribed.  Aim to set a meal schedule.  Stay active most 30 minutes per day.

## 2022-08-08 ENCOUNTER — Other Ambulatory Visit (HOSPITAL_COMMUNITY)
Admission: RE | Admit: 2022-08-08 | Discharge: 2022-08-08 | Disposition: A | Discharge status: Home / Self Care | Payer: Medicare Other | Source: Ambulatory Visit | Attending: Family | Admitting: Family

## 2022-08-08 ENCOUNTER — Encounter: Payer: Self-pay | Admitting: Family

## 2022-08-08 ENCOUNTER — Ambulatory Visit: Payer: Self-pay | Admitting: *Deleted

## 2022-08-08 ENCOUNTER — Ambulatory Visit (INDEPENDENT_AMBULATORY_CARE_PROVIDER_SITE_OTHER): Payer: Medicare Other | Admitting: Family

## 2022-08-08 VITALS — BP 125/85 | HR 68 | Temp 98.0°F | Resp 14 | Ht 62.0 in | Wt 157.2 lb

## 2022-08-08 DIAGNOSIS — R109 Unspecified abdominal pain: Secondary | ICD-10-CM | POA: Insufficient documentation

## 2022-08-08 LAB — POCT URINALYSIS DIP (CLINITEK)
Bilirubin, UA: NEGATIVE
Blood, UA: NEGATIVE
Glucose, UA: >=1000 mg/dL — AB
Ketones, POC UA: NEGATIVE mg/dL
Leukocytes, UA: NEGATIVE
Nitrite, UA: NEGATIVE
POC PROTEIN,UA: NEGATIVE
Spec Grav, UA: 1.015 (ref 1.010–1.025)
Urobilinogen, UA: 0.2 E.U./dL
pH, UA: 5.5 (ref 5.0–8.0)

## 2022-08-08 MED ORDER — METRONIDAZOLE 500 MG PO TABS
500.0000 mg | ORAL_TABLET | Freq: Three times a day (TID) | ORAL | 0 refills | Status: AC
Start: 2022-08-08 — End: 2022-08-15

## 2022-08-08 NOTE — Progress Notes (Signed)
Pt is here for abdominal pain   Complaining of left upper quadrant pain started X1 month ago   Denies any urinary symptoms or bowel changes

## 2022-08-08 NOTE — Telephone Encounter (Signed)
Reason for Disposition  [1] MILD-MODERATE pain AND [2] constant AND [3] present > 2 hours    Got her an appt for today at 3:20 with Ricky Stabs, NP  Answer Assessment - Initial Assessment Questions 1. LOCATION: "Where does it hurt?"      Having abd pain on left side.   They didn't have to cut me.   They did a camera to fix it.  I had a hernia in my stomach.  May 04, 2022 they did the surgery and fixed it. It feels like a cramp in my stomach.  The pain is at the bottom of my ribs on the left side.   I have a history of diverticulosis but I haven't had that in a very long time.    One of the holes from the camera they used for my surgery is sore.   No redness or swelling or fever with any of the puncture holes from the surgery. It hurts to have a BM.    No constipated since colonoscopy.    My stomach feels soft.    Pt difficult to triage.   Would say one thing and then contradict herself.   There was an appt with Ricky Stabs, NP today at 3:20.   Pt said if she left now she could make it to the appt. So ended call at this point.  2. RADIATION: "Does the pain shoot anywhere else?" (e.g., chest, back)     Not asked 3. ONSET: "When did the pain begin?" (e.g., minutes, hours or days ago)      The pain started after I had my surgery maybe a month ago. 4. SUDDEN: "Gradual or sudden onset?"     Not asked 5. PATTERN "Does the pain come and go, or is it constant?"    - If it comes and goes: "How long does it last?" "Do you have pain now?"     (Note: Comes and goes means the pain is intermittent. It goes away completely between bouts.)    - If constant: "Is it getting better, staying the same, or getting worse?"      (Note: Constant means the pain never goes away completely; most serious pain is constant and gets worse.)       6. SEVERITY: "How bad is the pain?"  (e.g., Scale 1-10; mild, moderate, or severe)    - MILD (1-3): Doesn't interfere with normal activities, abdomen soft and not tender to  touch.     - MODERATE (4-7): Interferes with normal activities or awakens from sleep, abdomen tender to touch.     - SEVERE (8-10): Excruciating pain, doubled over, unable to do any normal activities.       Moderate 7. RECURRENT SYMPTOM: "Have you ever had this type of stomach pain before?" If Yes, ask: "When was the last time?" and "What happened that time?"      Not asked 8. CAUSE: "What do you think is causing the stomach pain?"     Don't know 9. RELIEVING/AGGRAVATING FACTORS: "What makes it better or worse?" (e.g., antacids, bending or twisting motion, bowel movement)     Not asked 10. OTHER SYMPTOMS: "Do you have any other symptoms?" (e.g., back pain, diarrhea, fever, urination pain, vomiting)       No fever, constipation, soreness, redness or drainage from puncture sites from her surgery in March. 11. PREGNANCY: "Is there any chance you are pregnant?" "When was your last menstrual period?"       N/A  due to age  Protocols used: Abdominal Pain - [facility]

## 2022-08-08 NOTE — Progress Notes (Signed)
Patient ID: Courtney [name], female    DOB: 23-Mar-1955  MRN: [id number]  CC: Abdominal Pain  Subjective: Courtney [name] is a 67 y.o. female who presents for abdominal pain.  Her concerns today include:  08/08/2022 per triage RN call note:   Chief Complaint: abd pain  (Difficult to triage.   Would say one thing and then contradict herself).    Symptoms: abd pain Frequency: A month Pertinent Negatives: Patient denies constipation or fever Disposition: [] ED /[] Urgent Care (no appt availability in office) / [x] Appointment(In office/virtual)/ []  [facility]/ [] Home Care/ [] Refused Recommended Disposition /[] [facility]/ []  Follow-up with PCP Additional Notes: Appt made with Lavontae Cornia,NP for today at 3:20.   She said if she left now she could be there in time for the appt.          Reason for Disposition  [1] MILD-MODERATE pain AND [2] constant AND [3] present > 2 hours    Got her an appt for today at 3:20 with Ricky Stabs, NP  Answer Assessment - Initial Assessment Questions 1. LOCATION: "Where does it hurt?"      Having abd pain on left side.   They didn't have to cut me.   They did a camera to fix it.  I had a hernia in my stomach.  May 04, 2022 they did the surgery and fixed it. It feels like a cramp in my stomach.  The pain is at the bottom of my ribs on the left side.   I have a history of diverticulosis but I haven't had that in a very long time.    One of the holes from the camera they used for my surgery is sore.   No redness or swelling or fever with any of the puncture holes from the surgery. It hurts to have a BM.    No constipated since colonoscopy.    My stomach feels soft.    Pt difficult to triage.   Would say one thing and then contradict herself.   There was an appt with Ricky Stabs, NP today at 3:20.   Pt said if she left now she could make it to the appt. So ended call at this point.  2. RADIATION: "Does the pain shoot anywhere  else?" (e.g., chest, back)     Not asked 3. ONSET: "When did the pain begin?" (e.g., minutes, hours or days ago)      The pain started after I had my surgery maybe a month ago. 4. SUDDEN: "Gradual or sudden onset?"     Not asked 5. PATTERN "Does the pain come and go, or is it constant?"    - If it comes and goes: "How long does it last?" "Do you have pain now?"     (Note: Comes and goes means the pain is intermittent. It goes away completely between bouts.)    - If constant: "Is it getting better, staying the same, or getting worse?"      (Note: Constant means the pain never goes away completely; most serious pain is constant and gets worse.)       6. SEVERITY: "How bad is the pain?"  (e.g., Scale 1-10; mild, moderate, or severe)    - MILD (1-3): Doesn't interfere with normal activities, abdomen soft and not tender to touch.     - MODERATE (4-7): Interferes with normal activities or awakens from sleep, abdomen tender to touch.     - SEVERE (  8-10): Excruciating pain, doubled over, unable to do any normal activities.       Moderate 7. RECURRENT SYMPTOM: "Have you ever had this type of stomach pain before?" If Yes, ask: "When was the last time?" and "What happened that time?"      Not asked 8. CAUSE: "What do you think is causing the stomach pain?"     Don't know 9. RELIEVING/AGGRAVATING FACTORS: "What makes it better or worse?" (e.g., antacids, bending or twisting motion, bowel movement)     Not asked 10. OTHER SYMPTOMS: "Do you have any other symptoms?" (e.g., back pain, diarrhea, fever, urination pain, vomiting)       No fever, constipation, soreness, redness or drainage from puncture sites from her surgery in March. 11. PREGNANCY: "Is there any chance you are pregnant?" "When was your last menstrual period?"       N/A due to age  Protocols used: Abdominal Pain - Female-A-AH     Today's visit 08/08/2022: Patient reports left stomach "soreness". She denies associated red flag symptoms.  She is aware of upcoming appointment with Gastroenterology. States she plans to schedule an appointment soon with her surgeon. No further issues/concerns for discussion today.    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   Sebaceous cyst 05/03/2021   Seborrheic dermatitis of scalp 05/03/2021   Biceps tendonitis on left 04/30/2021   Stage 3a chronic kidney disease (HCC) 10/21/2020   Type 2 diabetes mellitus with stage 3a chronic kidney disease, with long-term current use of insulin (HCC) 10/21/2020   Type 2 diabetes mellitus with diabetic polyneuropathy, with long-term current use of insulin (HCC) 06/20/2020   Dyslipidemia, goal LDL below 70 04/10/2020   Episode of recurrent major depressive disorder (HCC) 04/10/2020   Pain in left shoulder 04/04/2020   Vaginal dryness, menopausal 07/07/2019   Tremor 05/30/2019   Memory loss 05/03/2019   Hx of adenomatous colonic polyps 03/16/2019   History of colonic polyps 03/16/2019   Fracture of distal phalanx of right ring finger 03/15/2019   Facet arthritis, degenerative, 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   Polyp of transverse colon    Special screening for malignant neoplasms, colon    Polyp of sigmoid colon    Anxiety 09/09/2018   GERD (gastroesophageal reflux disease) 09/09/2018   Cervicalgia 08/10/2018   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   Laryngopharyngeal reflux (LPR) 05/14/2018   Throat pain 05/14/2018   Loss of weight    RLQ abdominal pain 03/27/2017   Bloating 03/27/2017   Abnormal finding on imaging 03/27/2017   Dilation of biliary tract 03/27/2017   Nausea 03/27/2017   Chronic sinusitis 03/19/2017   Multiple joint pain 03/31/2016   Essential  tremor 03/31/2016   Numbness in both hands 03/18/2016   Hypersomnia 11/09/2015   Diabetes mellitus (HCC) 07/18/2015   Abnormal ECG 07/18/2015   Gait difficulty 02/09/2015   Chronic left shoulder pain 02/09/2015   Generalized anxiety disorder 02/09/2015   Numbness 02/09/2015   Insomnia 02/09/2015   Urinary hesitancy 02/09/2015   Other fatigue 02/09/2015   Fibromyalgia 02/09/2015   Depression with anxiety 02/09/2015   Essential hypertension 09/01/2011   Coronary artery disease involving native coronary artery of native heart without angina pectoris 09/01/2011  MS (multiple sclerosis) (HCC) 09/01/2011   Parkinson's disease 09/01/2011   Type 2 diabetes mellitus without complications (HCC) 09/01/2011   Chronic pain 09/01/2011     Current Outpatient Medications on File Prior to Visit  Medication Sig Dispense Refill   acetaminophen (TYLENOL) 500 MG tablet Take 1,000 mg by mouth every 8 (eight) hours as needed for headache.     ALPRAZolam (XANAX) 1 MG tablet Take 0.5-1 mg by mouth 3 (three) times daily as needed for anxiety.     Blood Glucose Monitoring Suppl (FREESTYLE LITE) w/Device KIT 1 Device by Does not apply route daily in the afternoon. 1 kit 0   Continuous Blood Gluc Sensor (DEXCOM G7 SENSOR) MISC 1 Device by Does not apply route as directed. 9 each 3   empagliflozin (JARDIANCE) 25 MG TABS tablet Take 1 tablet (25 mg total) by mouth daily. 90 tablet 3   Estradiol 10 MCG TABS vaginal tablet Place 1 tablet (10 mcg total) vaginally at bedtime for 14 days, THEN 1 tablet (10 mcg total) 2 (two) times a week. 30 tablet 3   glucose blood (FREESTYLE LITE) test strip 1 each by Other route in the morning, at noon, in the evening, and at bedtime. Use as instructed 400 each 3   hydrocortisone cream 0.5 % Apply 1 Application topically 2 (two) times daily. 30 g 0   insulin aspart (NOVOLOG FLEXPEN) 100 UNIT/ML FlexPen Max daily 30 units 3 mL 0   insulin glargine (LANTUS SOLOSTAR) 100 UNIT/ML  Solostar Pen Inject 12 Units into the skin daily. 3 mL 0   Insulin Pen Needle 32G X 4 MM MISC 1 Device by Does not apply route in the morning, at noon, in the evening, and at bedtime. 400 each 3   ketoconazole (NIZORAL) 2 % cream Apply 1 Application topically daily. 60 g 0   Lancets (FREESTYLE) lancets 1 Device by Misc.(Non-Drug; Combo Route) route daily. USED TO CHECK BLOOD SUGAR TID 200 each 3   latanoprost (XALATAN) 0.005 % ophthalmic solution Place 1 drop into both eyes at bedtime.     loratadine (CLARITIN) 10 MG tablet Take 10 mg by mouth daily as needed for allergies (seasonal).     modafinil (PROVIGIL) 100 MG tablet Take 25 mg by mouth daily as needed (Takes to help stay awake due to insomnia, takes at 6a if needed).     Multiple Vitamins-Minerals (MULTIVITAMIN ADULT) CHEW Chew 2 each by mouth 2 (two) times a week.     mupirocin ointment (BACTROBAN) 2 % Apply 1 Application topically 2 (two) times daily. 22 g 0   naloxone (NARCAN) nasal spray 4 mg/0.1 mL Place 1 spray into the nose daily as needed (overdose).     ondansetron (ZOFRAN) 4 MG tablet Take 1 tablet (4 mg total) by mouth every 6 (six) hours as needed for nausea or vomiting. Prior to colonoscopy prep 15 tablet 0   oxyCODONE (ROXICODONE) 15 MG immediate release tablet Take 15 mg by mouth 5 (five) times daily as needed for pain.     pantoprazole (PROTONIX) 40 MG tablet Take 1 tablet (40 mg total) by mouth daily. (Patient not taking: Reported on 08/07/2022) 90 tablet 3   terconazole (TERAZOL 3) 0.8 % vaginal cream Place 1 applicator vaginally at bedtime. (Patient not taking: Reported on 08/08/2022) 60 g 2   [DISCONTINUED] sucralfate (CARAFATE) 1 g tablet Take 1 tablet (1 g total) by mouth 4 (four) times daily -  before meals and at bedtime. Dissolve 1 tablet in  2-4 oz of water, drink as slurry 120 tablet 1   No current facility-administered medications on file prior to visit.    Allergies  Allergen Reactions   Ibuprofen Other (See  Comments)    Developed a ulcer     Methylprednisolone Anaphylaxis, Shortness Of Breath and Palpitations   Naproxen Other (See Comments)    Developed a ulcer    Nsaids Other (See Comments)    Pt gets Ulcers    Prednisone Anaphylaxis, Shortness Of Breath and Palpitations   Bactrim [Sulfamethoxazole-Trimethoprim] Other (See Comments)    "blotchiness" redness, face swelling   Biaxin [Clarithromycin] Swelling   Sulfa Antibiotics Swelling and Other (See Comments)    Angioedema    Ozempic (0.25 Or 0.5 Mg-Dose) [Semaglutide(0.25 Or 0.5mg -Dos)] Rash    Social History   Socioeconomic History   Marital status: Married    Spouse name: Jimmie   Number of children: 4   Years of education: Not on file   Highest education level: 12th grade  Occupational History   Occupation: disabled  Tobacco Use   Smoking status: Former    Packs/day: 0.25    Years: 5.00    Additional pack years: 0.00    Total pack years: 1.25    Types: Cigarettes    Quit date: 1990    Years since quitting: 34.4    Passive exposure: Never   Smokeless tobacco: Never  Vaping Use   Vaping Use: Never used  Substance and Sexual Activity   Alcohol use: Not Currently    Comment: rarely   Drug use: No   Sexual activity: Yes  Other Topics Concern   Not on file  Social History Narrative   Pt is right-handed. She lives with her husband in a 2 story house. Pt avoids caffeine. She and her husband go to the gym, lately once weekly, previously 3 times a week.    Social Determinants of Health   Financial Resource Strain: Not on file  Food Insecurity: Not on file  Transportation Needs: Not on file  Physical Activity: Not on file  Stress: Not on file  Social Connections: Not on file  Intimate Partner Violence: Not on file    Family History  Problem Relation Age of Onset   Heart disease Mother    Kidney disease Mother    Diabetes Mother    Other Father 33       murdered   Multiple sclerosis Daughter    Multiple  sclerosis Other    Diabetes Sister    Alcohol abuse Brother        ETOH and marijuana   Diabetes Brother    Diabetes Sister    Diabetes Brother    ALS Brother     Past Surgical History:  Procedure Laterality Date   ABDOMINAL HYSTERECTOMY  1976   including oophorectomy, due to uterine cancer   APPENDECTOMY  1974   BIOPSY  01/31/2019   Procedure: BIOPSY;  Surgeon: Napoleon Form, MD;  Location: [facility];  Service: Endoscopy;;   CARDIAC CATHETERIZATION     2001/2006 In [place]. Normal per patient   CARPAL TUNNEL RELEASE Left    carpal tunnel surgery   CHOLECYSTECTOMY     2006   COLON SURGERY     [facility] x 2   COLONOSCOPY WITH PROPOFOL N/A 01/31/2019   Procedure: COLONOSCOPY WITH PROPOFOL;  Surgeon: Napoleon Form, MD;  Location: [facility];  Service: Endoscopy;  Laterality: N/A;   COLONOSCOPY WITH PROPOFOL N/A 04/24/2022  Procedure: COLONOSCOPY WITH PROPOFOL;  Surgeon: Napoleon Form, MD;  Location: [facility];  Service: Gastroenterology;  Laterality: N/A;   EUS N/A 04/09/2017   Procedure: UPPER ENDOSCOPIC ULTRASOUND (EUS) LINEAR;  Surgeon: Rachael Fee, MD;  Location: [facility];  Service: Endoscopy;  Laterality: N/A;   HERNIA REPAIR     INCISIONAL HERNIA REPAIR N/A 05/05/2022   Procedure: LAPAROSCOPIC VENTRAL HERNA REPAIR WITH MESH;  Surgeon: Fritzi Mandes, MD;  Location: [facility];  Service: General;  Laterality: N/A;   INSERTION OF MESH N/A 05/05/2022   Procedure: INSERTION OF MESH;  Surgeon: Fritzi Mandes, MD;  Location: [facility];  Service: General;  Laterality: N/A;   MENISCUS REPAIR Left    POLYPECTOMY  01/31/2019   Procedure: POLYPECTOMY;  Surgeon: Napoleon Form, MD;  Location: [facility];  Service: Endoscopy;;   POLYPECTOMY  04/24/2022   Procedure: POLYPECTOMY;  Surgeon: Napoleon Form, MD;  Location: [facility];  Service: Gastroenterology;;   SMALL INTESTINE SURGERY     toenail removal Right 12/2017   great toenail      ROS: Review of Systems Negative except as stated above  PHYSICAL EXAM: BP 125/85 (BP Location: Left Arm, Patient Position: Sitting, Cuff Size: Normal)   Pulse 68   Temp 98 F (36.7 C)   Resp 14   Ht 5\' 2"  (1.575 m)   Wt 157 lb 3.2 oz (71.3 kg)   SpO2 99%   BMI 28.75 kg/m   Physical Exam HENT:     Head: Normocephalic and atraumatic.     Nose: Nose normal.     Mouth/Throat:     Mouth: Mucous membranes are moist.     Pharynx: Oropharynx is clear.  Eyes:     Extraocular Movements: Extraocular movements intact.     Conjunctiva/sclera: Conjunctivae normal.     Pupils: Pupils are equal, round, and reactive to light.  Cardiovascular:     Rate and Rhythm: Normal rate and regular rhythm.     Pulses: Normal pulses.     Heart sounds: Normal heart sounds.  Pulmonary:     Effort: Pulmonary effort is normal.     Breath sounds: Normal breath sounds.  Abdominal:     General: Bowel sounds are normal.     Palpations: Abdomen is soft.     Tenderness: There is abdominal tenderness.  Musculoskeletal:     Cervical back: Normal range of motion and neck supple.  Skin:    General: Skin is warm and dry.  Neurological:     General: No focal deficit present.     Mental Status: She is alert and oriented to person, place, and time.  Psychiatric:        Mood and Affect: Mood normal.        Behavior: Behavior normal.     ASSESSMENT AND PLAN: 1. Abdominal discomfort - Metronidazole as prescribed. Counseled on medication adherence/adverse effects.  - Routine screening.  - Keep all scheduled appointments with Gastroenterology and surgeon. - Follow-up with primary provider as scheduled.  - CMP14+EGFR - Amylase - Lipase - POCT URINALYSIS DIP (CLINITEK); Future - Cervicovaginal ancillary only - CBC - metroNIDAZOLE (FLAGYL) 500 MG tablet; Take 1 tablet (500 mg total) by mouth 3 (three) times daily for 7 days.  Dispense: 21 tablet; Refill: 0   Patient was given the opportunity to ask  questions.  Patient verbalized understanding of the plan and was able to repeat key elements of the plan. Patient was given clear instructions to  go to Emergency Department or return to medical center if symptoms don't improve, worsen, or new problems develop.The patient verbalized understanding.   Orders Placed This Encounter  Procedures   CMP14+EGFR   Amylase   Lipase   CBC   POCT URINALYSIS DIP (CLINITEK)     Requested Prescriptions   Signed Prescriptions Disp Refills   metroNIDAZOLE (FLAGYL) 500 MG tablet 21 tablet 0    Sig: Take 1 tablet (500 mg total) by mouth 3 (three) times daily for 7 days.    Follow-up with primary provider as scheduled.  Rema Fendt, NP

## 2022-08-08 NOTE — Telephone Encounter (Signed)
  Chief Complaint: abd pain  (Difficult to triage.   Would say one thing and then contradict herself).    Symptoms: abd pain Frequency: A month Pertinent Negatives: Patient denies constipation or fever Disposition: [] ED /[] Urgent Care (no appt availability in office) / [x] Appointment(In office/virtual)/ []  [facility]/ [] Home Care/ [] Refused Recommended Disposition /[] [facility]/ []  Follow-up with PCP Additional Notes: Appt made with Amy Stephens,NP for today at 3:20.   She said if she left now she could be there in time for the appt.

## 2022-08-09 LAB — CBC
Hematocrit: 41.1 % (ref 34.0–46.6)
Hemoglobin: 13.6 g/dL (ref 11.1–15.9)
MCH: 27.9 pg (ref 26.6–33.0)
MCHC: 33.1 g/dL (ref 31.5–35.7)
MCV: 84 fL (ref 79–97)
Platelets: 180 10*3/uL (ref 150–450)
RBC: 4.87 x10E6/uL (ref 3.77–5.28)
RDW: 13.4 % (ref 11.7–15.4)
WBC: 5.3 10*3/uL (ref 3.4–10.8)

## 2022-08-09 LAB — CMP14+EGFR
ALT: 11 IU/L (ref 0–32)
AST: 19 IU/L (ref 0–40)
Albumin/Globulin Ratio: 1.8 (ref 1.2–2.2)
Albumin: 4.9 g/dL (ref 3.9–4.9)
Alkaline Phosphatase: 92 IU/L (ref 44–121)
BUN/Creatinine Ratio: 16 (ref 12–28)
BUN: 17 mg/dL (ref 8–27)
Bilirubin Total: 0.3 mg/dL (ref 0.0–1.2)
CO2: 26 mmol/L (ref 20–29)
Calcium: 9.6 mg/dL (ref 8.7–10.3)
Chloride: 100 mmol/L (ref 96–106)
Creatinine, Ser: 1.06 mg/dL — ABNORMAL HIGH (ref 0.57–1.00)
Globulin, Total: 2.7 g/dL (ref 1.5–4.5)
Glucose: 144 mg/dL — ABNORMAL HIGH (ref 70–99)
Potassium: 4.2 mmol/L (ref 3.5–5.2)
Sodium: 140 mmol/L (ref 134–144)
Total Protein: 7.6 g/dL (ref 6.0–8.5)
eGFR: 58 mL/min/{1.73_m2} — ABNORMAL LOW (ref >59)

## 2022-08-09 LAB — LIPASE: Lipase: 14 U/L (ref 14–72)

## 2022-08-09 LAB — AMYLASE: Amylase: 73 U/L (ref 31–110)

## 2022-08-11 ENCOUNTER — Other Ambulatory Visit: Payer: Self-pay | Admitting: Family

## 2022-08-11 DIAGNOSIS — B3731 Acute candidiasis of vulva and vagina: Secondary | ICD-10-CM

## 2022-08-11 DIAGNOSIS — Z13228 Encounter for screening for other metabolic disorders: Secondary | ICD-10-CM

## 2022-08-11 LAB — CERVICOVAGINAL ANCILLARY ONLY
Bacterial Vaginitis (gardnerella): NEGATIVE
Candida Glabrata: POSITIVE — AB
Candida Vaginitis: NEGATIVE
Chlamydia: NEGATIVE
Comment: NEGATIVE
Comment: NEGATIVE
Comment: NEGATIVE
Comment: NEGATIVE
Comment: NEGATIVE
Comment: NORMAL
Neisseria Gonorrhea: NEGATIVE
Trichomonas: NEGATIVE

## 2022-08-11 MED ORDER — FLUCONAZOLE 150 MG PO TABS: 150.0000 mg | ORAL_TABLET | ORAL | 0 refills | Status: AC

## 2022-08-12 ENCOUNTER — Encounter: Payer: Self-pay | Admitting: Nurse Practitioner

## 2022-08-12 ENCOUNTER — Ambulatory Visit (INDEPENDENT_AMBULATORY_CARE_PROVIDER_SITE_OTHER): Payer: Medicare Other | Admitting: Nurse Practitioner

## 2022-08-12 VITALS — BP 122/68 | HR 77 | Ht 62.0 in | Wt 156.0 lb

## 2022-08-12 DIAGNOSIS — R109 Unspecified abdominal pain: Secondary | ICD-10-CM | POA: Diagnosis not present

## 2022-08-12 DIAGNOSIS — K5909 Other constipation: Secondary | ICD-10-CM

## 2022-08-12 NOTE — Progress Notes (Signed)
08/12/2022 Courtney [name] [id number] 1955/09/15   Chief Complaint: Left mid abdominal pain  History of Present Illness: Courtney [name]. Courtney [name] is a 67 year old female with a past medical history of arthritis, asthma, anxiety, depression, hypertension, coronary artery disease, diabetes mellitus type II, multiple sclerosis, SBO x 2, diverticulitis. S/P ventral hernia repair with mesh placement 05/04/2022.  She is known by Courtney [name]. She presents today with complaints of left mid abdominal pain which started 3 weeks ago which comes and goes.  No nausea or vomiting.  She is followed by pain management due to generalized body aches related to fibromyalgia for which she takes Oxycodone 15 mg 5 times daily.  She passes 2-3 bowel movements every other day.  No rectal bleeding or black stools.  She has been under significant stress since her daughter passed away 07-12-22.  She questions if her stress level may be contributing to her GI symptoms. She underwent laboratory studies 08/08/2022 which showed a normal CBC, LFTs and lipase level. Her most recent colonoscopy was 04/24/2022 which showed two polyps removed from the rectum and sigmoid colon.   Colonoscopy 04/24/2022:  - Two 3 to 4 mm polyps in the rectum and in the sigmoid colon, removed with a cold snare.   - Non-bleeding external and internal hemorrhoids.  - The examination was otherwise normal. - 5 year recall colonoscopy  A. COLON, SIGMOID, RECTUM, POLYPECTOMY: - Tubular adenoma without high-grade dysplasia or malignancy - Hyperplastic polyp      Latest Ref Rng & Units 08/08/2022   12:00 AM 04/30/2022    1:48 PM 04/26/2018    3:51 PM  CBC  WBC 3.4 - 10.8 x10E3/uL 5.3  5.3  6.4   Hemoglobin 11.1 - 15.9 g/dL 40.9  81.1  91.4   Hematocrit 34.0 - 46.6 % 41.1  43.5  43.0   Platelets 150 - 450 x10E3/uL 180  182  179.0        Latest Ref Rng & Units 08/08/2022   12:00 AM 04/30/2022    1:48 PM 08/14/2021    1:17 PM  CMP  Glucose 70 - 99  mg/dL 782  956    BUN 8 - 27 mg/dL 17  14    Creatinine 2.13 - 1.00 mg/dL 0.86  5.78  4.69   Sodium 134 - 144 mmol/L 140  138    Potassium 3.5 - 5.2 mmol/L 4.2  4.3    Chloride 96 - 106 mmol/L 100  104    CO2 20 - 29 mmol/L 26  25    Calcium 8.7 - 10.3 mg/dL 9.6  9.3    Total Protein 6.0 - 8.5 g/dL 7.6     Total Bilirubin 0.0 - 1.2 mg/dL 0.3     Alkaline Phos 44 - 121 IU/L 92     AST 0 - 40 IU/L 19     ALT 0 - 32 IU/L 11      Lipase 14  Current Outpatient Medications on File Prior to Visit  Medication Sig Dispense Refill   acetaminophen (TYLENOL) 500 MG tablet Take 1,000 mg by mouth every 8 (eight) hours as needed for headache.     ALPRAZolam (XANAX) 1 MG tablet Take 0.5-1 mg by mouth 3 (three) times daily as needed for anxiety.     Blood Glucose Monitoring Suppl (FREESTYLE LITE) w/Device KIT 1 Device by Does not apply route daily in the afternoon. 1 kit 0   Continuous Blood Gluc Sensor (DEXCOM G7  SENSOR) MISC 1 Device by Does not apply route as directed. 9 each 3   empagliflozin (JARDIANCE) 25 MG TABS tablet Take 1 tablet (25 mg total) by mouth daily. 90 tablet 3   Estradiol 10 MCG TABS vaginal tablet Place 1 tablet (10 mcg total) vaginally at bedtime for 14 days, THEN 1 tablet (10 mcg total) 2 (two) times a week. 30 tablet 3   fluconazole (DIFLUCAN) 150 MG tablet Take 1 tablet (150 mg total) by mouth every 3 (three) days for 2 doses. 2 tablet 0   glucose blood (FREESTYLE LITE) test strip 1 each by Other route in the morning, at noon, in the evening, and at bedtime. Use as instructed 400 each 3   hydrocortisone cream 0.5 % Apply 1 Application topically 2 (two) times daily. 30 g 0   insulin aspart (NOVOLOG FLEXPEN) 100 UNIT/ML FlexPen Max daily 30 units 3 mL 0   insulin glargine (LANTUS SOLOSTAR) 100 UNIT/ML Solostar Pen Inject 12 Units into the skin daily. 3 mL 0   Insulin Pen Needle 32G X 4 MM MISC 1 Device by Does not apply route in the morning, at noon, in the evening, and at bedtime.  400 each 3   ketoconazole (NIZORAL) 2 % cream Apply 1 Application topically daily. 60 g 0   Lancets (FREESTYLE) lancets 1 Device by Misc.(Non-Drug; Combo Route) route daily. USED TO CHECK BLOOD SUGAR TID 200 each 3   latanoprost (XALATAN) 0.005 % ophthalmic solution Place 1 drop into both eyes at bedtime.     loratadine (CLARITIN) 10 MG tablet Take 10 mg by mouth daily as needed for allergies (seasonal).     metroNIDAZOLE (FLAGYL) 500 MG tablet Take 1 tablet (500 mg total) by mouth 3 (three) times daily for 7 days. 21 tablet 0   modafinil (PROVIGIL) 100 MG tablet Take 25 mg by mouth daily as needed (Takes to help stay awake due to insomnia, takes at 6a if needed).     Multiple Vitamins-Minerals (MULTIVITAMIN ADULT) CHEW Chew 2 each by mouth 2 (two) times a week.     mupirocin ointment (BACTROBAN) 2 % Apply 1 Application topically 2 (two) times daily. 22 g 0   naloxone (NARCAN) nasal spray 4 mg/0.1 mL Place 1 spray into the nose daily as needed (overdose).     ondansetron (ZOFRAN) 4 MG tablet Take 1 tablet (4 mg total) by mouth every 6 (six) hours as needed for nausea or vomiting. Prior to colonoscopy prep 15 tablet 0   oxyCODONE (ROXICODONE) 15 MG immediate release tablet Take 15 mg by mouth 5 (five) times daily as needed for pain.     pantoprazole (PROTONIX) 40 MG tablet Take 1 tablet (40 mg total) by mouth daily. 90 tablet 3   terconazole (TERAZOL 3) 0.8 % vaginal cream Place 1 applicator vaginally at bedtime. 60 g 2   [DISCONTINUED] sucralfate (CARAFATE) 1 g tablet Take 1 tablet (1 g total) by mouth 4 (four) times daily -  before meals and at bedtime. Dissolve 1 tablet in 2-4 oz of water, drink as slurry 120 tablet 1   No current facility-administered medications on file prior to visit.   Allergies  Allergen Reactions   Ibuprofen Other (See Comments)    Developed a ulcer     Methylprednisolone Anaphylaxis, Shortness Of Breath and Palpitations   Naproxen Other (See Comments)    Developed a  ulcer    Nsaids Other (See Comments)    Pt gets Ulcers    Prednisone  Anaphylaxis, Shortness Of Breath and Palpitations   Bactrim [Sulfamethoxazole-Trimethoprim] Other (See Comments)    "blotchiness" redness, face swelling   Biaxin [Clarithromycin] Swelling   Sulfa Antibiotics Swelling and Other (See Comments)    Angioedema    Ozempic (0.25 Or 0.5 Mg-Dose) [Semaglutide(0.25 Or 0.5mg -Dos)] Rash   Current Medications, Allergies, Past Medical History, Past Surgical History, Family History and Social History were reviewed in [employer] record.  Review of Systems:   Constitutional: Negative for fever, sweats, chills or weight loss.  Respiratory: Negative for shortness of breath.   Cardiovascular: Negative for chest pain, palpitations and leg swelling.  Gastrointestinal: See HPI.  Musculoskeletal: Negative for back pain or muscle aches.  Neurological: Negative for dizziness, headaches or paresthesias.    Physical Exam: BP 122/68   Pulse 77   Ht 5\' 2"  (1.575 m)   Wt 156 lb (70.8 kg)   BMI 28.53 kg/m   Wt Readings from Last 3 Encounters:  08/12/22 156 lb (70.8 kg)  08/08/22 157 lb 3.2 oz (71.3 kg)  08/07/22 157 lb (71.2 kg)    General: 67 year old female in no acute distress. Head: Normocephalic and atraumatic. Eyes: No scleral icterus. Conjunctiva pink . Ears: Normal auditory acuity. Mouth: Dentition intact. No ulcers or lesions.  Lungs: Clear throughout to auscultation. Heart: Regular rate and rhythm, no murmur. Abdomen: Soft, nondistended. No masses or hepatomegaly. Normal bowel sounds x 4 quadrants. Multiple abdominal scars intact.  Rectal: Deferred.  Musculoskeletal: Symmetrical with no gross deformities. Extremities: No edema. Neurological: Alert oriented x 4. No focal deficits.  Psychological: Alert and cooperative. Normal mood and affect  Assessment and Recommendations:  67 year old female with left mid abdominal pain. No N/V. Normal lipase  level and LFTs. Increased stress, grieving the loss of her daughter +/- adhesions from prior abdominal surgeries may be contributing factors.  -CTAP if left mid abdominal pain persists or worsens  -Miralax 1/2 dose Q HS to increase stool output  -Ibgard one capsule po bid, samples provided  -Follow up with PCP for increased stress/grieving -Patient to contact me in 2 weeks with update   Chronic pain, chronic opioid use, followed by pain management  History of colon polyps. Colonoscopy 04/24/2022 identified two hyperplastic/tubular adenomatous polyps removed from the rectum and sigmoid colon  -Next colonoscopy due 04/2027

## 2022-08-12 NOTE — Patient Instructions (Signed)
IbGuard- 1 by mouth twice daily for abdominal pain, gas & bloating  Miralax- 1 half dose every night to increase stool output  Send Colleen,NP an update on MyChart in 2 weeks.  Thank you for trusting me with your gastrointestinal care!   Alcide Evener, CRNP

## 2022-08-14 ENCOUNTER — Ambulatory Visit (INDEPENDENT_AMBULATORY_CARE_PROVIDER_SITE_OTHER): Payer: Medicare Other

## 2022-08-14 VITALS — Ht 62.0 in | Wt 155.0 lb

## 2022-08-14 DIAGNOSIS — Z0000 Encounter for general adult medical examination without abnormal findings: Secondary | ICD-10-CM

## 2022-08-14 NOTE — Patient Instructions (Signed)
[name] , Thank you for taking time to come for your Medicare Wellness Visit. I appreciate your ongoing commitment to your health goals. Please review the following plan we discussed and let me know if I can assist you in the future.   These are the goals we discussed:  Goals      Patient Stated     08/14/2022, drink more water        This is a list of the screening recommended for you and due dates:  Health Maintenance  Topic Date Due   Yearly kidney health urinalysis for diabetes  10/19/2021   COVID-19 Vaccine (4 - 2023-24 season) 11/01/2021   Pneumonia Vaccine (2 of 2 - PCV) 05/04/2022   Flu Shot  10/02/2022   Hemoglobin A1C  12/19/2022   Eye exam for diabetics  02/07/2023   Complete foot exam   06/06/2023   Yearly kidney function blood test for diabetes  08/08/2023   Medicare Annual Wellness Visit  08/14/2023   Mammogram  11/15/2023   DTaP/Tdap/Td vaccine (2 - Td or Tdap) 11/20/2029   Colon Cancer Screening  04/24/2032   DEXA scan (bone density measurement)  Completed   Hepatitis C Screening  Completed   Zoster (Shingles) Vaccine  Completed   HPV Vaccine  Aged Out    Advanced directives: Advance directive discussed with you today.   Conditions/risks identified: none  Next appointment: Follow up in one year for your annual wellness visit    Preventive Care 65 Years and Older, Female Preventive care refers to lifestyle choices and visits with your health care provider that can promote health and wellness. What does preventive care include? A yearly physical exam. This is also called an annual well check. Dental exams once or twice a year. Routine eye exams. Ask your health care provider how often you should have your eyes checked. Personal lifestyle choices, including: Daily care of your teeth and gums. Regular physical activity. Eating a healthy diet. Avoiding tobacco and drug use. Limiting alcohol use. Practicing safe sex. Taking low-dose aspirin every  day. Taking vitamin and mineral supplements as recommended by your health care provider. What happens during an annual well check? The services and screenings done by your health care provider during your annual well check will depend on your age, overall health, lifestyle risk factors, and family history of disease. Counseling  Your health care provider may ask you questions about your: Alcohol use. Tobacco use. Drug use. Emotional well-being. Home and relationship well-being. Sexual activity. Eating habits. History of falls. Memory and ability to understand (cognition). Work and work Astronomer. Reproductive health. Screening  You may have the following tests or measurements: Height, weight, and BMI. Blood pressure. Lipid and cholesterol levels. These may be checked every 5 years, or more frequently if you are over 18 years old. Skin check. Lung cancer screening. You may have this screening every year starting at age 31 if you have a 30-pack-year history of smoking and currently smoke or have quit within the past 15 years. Fecal occult blood test (FOBT) of the stool. You may have this test every year starting at age 32. Flexible sigmoidoscopy or colonoscopy. You may have a sigmoidoscopy every 5 years or a colonoscopy every 10 years starting at age 43. Hepatitis C blood test. Hepatitis B blood test. Sexually transmitted disease (STD) testing. Diabetes screening. This is done by checking your blood sugar (glucose) after you have not eaten for a while (fasting). You may have this done  every 1-3 years. Bone density scan. This is done to screen for osteoporosis. You may have this done starting at age 41. Mammogram. This may be done every 1-2 years. Talk to your health care provider about how often you should have regular mammograms. Talk with your health care provider about your test results, treatment options, and if necessary, the need for more tests. Vaccines  Your health care  provider may recommend certain vaccines, such as: Influenza vaccine. This is recommended every year. Tetanus, diphtheria, and acellular pertussis (Tdap, Td) vaccine. You may need a Td booster every 10 years. Zoster vaccine. You may need this after age 7. Pneumococcal 13-valent conjugate (PCV13) vaccine. One dose is recommended after age 16. Pneumococcal polysaccharide (PPSV23) vaccine. One dose is recommended after age 75. Talk to your health care provider about which screenings and vaccines you need and how often you need them. This information is not intended to replace advice given to you by your health care provider. Make sure you discuss any questions you have with your health care provider. Document Released: 03/16/2015 Document Revised: 11/07/2015 Document Reviewed: 12/19/2014 Elsevier Interactive Patient Education  2017 [place] Prevention in the Home Falls can cause injuries. They can happen to people of all ages. There are many things you can do to make your home safe and to help prevent falls. What can I do on the outside of my home? Regularly fix the edges of walkways and driveways and fix any cracks. Remove anything that might make you trip as you walk through a door, such as a raised step or threshold. Trim any bushes or trees on the path to your home. Use bright outdoor lighting. Clear any walking paths of anything that might make someone trip, such as rocks or tools. Regularly check to see if handrails are loose or broken. Make sure that both sides of any steps have handrails. Any raised decks and porches should have guardrails on the edges. Have any leaves, snow, or ice cleared regularly. Use sand or salt on walking paths during winter. Clean up any spills in your garage right away. This includes oil or grease spills. What can I do in the bathroom? Use night lights. Install grab bars by the toilet and in the tub and shower. Do not use towel bars as grab  bars. Use non-skid mats or decals in the tub or shower. If you need to sit down in the shower, use a plastic, non-slip stool. Keep the floor dry. Clean up any water that spills on the floor as soon as it happens. Remove soap buildup in the tub or shower regularly. Attach bath mats securely with double-sided non-slip rug tape. Do not have throw rugs and other things on the floor that can make you trip. What can I do in the bedroom? Use night lights. Make sure that you have a light by your bed that is easy to reach. Do not use any sheets or blankets that are too big for your bed. They should not hang down onto the floor. Have a firm chair that has side arms. You can use this for support while you get dressed. Do not have throw rugs and other things on the floor that can make you trip. What can I do in the kitchen? Clean up any spills right away. Avoid walking on wet floors. Keep items that you use a lot in easy-to-reach places. If you need to reach something above you, use a strong step stool that has  a grab bar. Keep electrical cords out of the way. Do not use floor polish or wax that makes floors slippery. If you must use wax, use non-skid floor wax. Do not have throw rugs and other things on the floor that can make you trip. What can I do with my stairs? Do not leave any items on the stairs. Make sure that there are handrails on both sides of the stairs and use them. Fix handrails that are broken or loose. Make sure that handrails are as long as the stairways. Check any carpeting to make sure that it is firmly attached to the stairs. Fix any carpet that is loose or worn. Avoid having throw rugs at the top or bottom of the stairs. If you do have throw rugs, attach them to the floor with carpet tape. Make sure that you have a light switch at the top of the stairs and the bottom of the stairs. If you do not have them, ask someone to add them for you. What else can I do to help prevent  falls? Wear shoes that: Do not have high heels. Have rubber bottoms. Are comfortable and fit you well. Are closed at the toe. Do not wear sandals. If you use a stepladder: Make sure that it is fully opened. Do not climb a closed stepladder. Make sure that both sides of the stepladder are locked into place. Ask someone to hold it for you, if possible. Clearly mark and make sure that you can see: Any grab bars or handrails. First and last steps. Where the edge of each step is. Use tools that help you move around (mobility aids) if they are needed. These include: Canes. Walkers. Scooters. Crutches. Turn on the lights when you go into a dark area. Replace any light bulbs as soon as they burn out. Set up your furniture so you have a clear path. Avoid moving your furniture around. If any of your floors are uneven, fix them. If there are any pets around you, be aware of where they are. Review your medicines with your doctor. Some medicines can make you feel dizzy. This can increase your chance of falling. Ask your doctor what other things that you can do to help prevent falls. This information is not intended to replace advice given to you by your health care provider. Make sure you discuss any questions you have with your health care provider. Document Released: 12/14/2008 Document Revised: 07/26/2015 Document Reviewed: 03/24/2014 Elsevier Interactive Patient Education  2017 [employer].

## 2022-08-14 NOTE — Progress Notes (Signed)
I connected with  Courtney [name] on 08/14/22 by a audio enabled telemedicine application and verified that I am speaking with the correct person using two identifiers.  Patient Location: Home  Provider Location: Office/Clinic  I discussed the limitations of evaluation and management by telemedicine. The patient expressed understanding and agreed to proceed. Subjective:   Courtney [name] is a 67 y.o. female who presents for Medicare Annual (Subsequent) preventive examination.  Review of Systems     Cardiac Risk Factors include: advanced age (>29men, >22 women);diabetes mellitus;dyslipidemia;hypertension     Objective:    Today's Vitals   08/14/22 1032 08/14/22 1033  Weight: 155 lb (70.3 kg)   Height: 5\' 2"  (1.575 m)   PainSc:  5    Body mass index is 28.35 kg/m.     08/14/2022   10:53 AM 08/07/2022    2:14 PM 04/30/2022    1:37 PM 04/24/2022    6:47 AM 11/13/2020   10:29 AM 01/31/2019    8:29 AM 01/25/2019   11:25 AM  Advanced Directives  Does Patient Have a Medical Advance Directive? No Yes Yes No Yes Yes Yes  Type of Surveyor, minerals;Living will  Healthcare Power of [place];Living will;Out of facility DNR (pink MOST or yellow form) Healthcare Power of [employer] Power of Attorney  Does patient want to make changes to medical advance directive?  Yes (MAU/Ambulatory/Procedural Areas - Information given)     No - Patient declined  Copy of Healthcare Power of Attorney in Chart?   No - copy requested  No - copy requested No - copy requested No - copy requested  Would patient like information on creating a medical advance directive?    No - Patient declined       Current Medications (verified) Outpatient Encounter Medications as of 08/14/2022  Medication Sig   acetaminophen (TYLENOL) 500 MG tablet Take 1,000 mg by mouth every 8 (eight) hours as needed for headache.   ALPRAZolam (XANAX) 1 MG tablet Take 0.5-1 mg by mouth 3  (three) times daily as needed for anxiety.   Blood Glucose Monitoring Suppl (FREESTYLE LITE) w/Device KIT 1 Device by Does not apply route daily in the afternoon.   Continuous Blood Gluc Sensor (DEXCOM G7 SENSOR) MISC 1 Device by Does not apply route as directed.   empagliflozin (JARDIANCE) 25 MG TABS tablet Take 1 tablet (25 mg total) by mouth daily.   glucose blood (FREESTYLE LITE) test strip 1 each by Other route in the morning, at noon, in the evening, and at bedtime. Use as instructed   hydrocortisone cream 0.5 % Apply 1 Application topically 2 (two) times daily.   insulin aspart (NOVOLOG FLEXPEN) 100 UNIT/ML FlexPen Max daily 30 units   insulin glargine (LANTUS SOLOSTAR) 100 UNIT/ML Solostar Pen Inject 12 Units into the skin daily.   Insulin Pen Needle 32G X 4 MM MISC 1 Device by Does not apply route in the morning, at noon, in the evening, and at bedtime.   ketoconazole (NIZORAL) 2 % cream Apply 1 Application topically daily.   Lancets (FREESTYLE) lancets 1 Device by Misc.(Non-Drug; Combo Route) route daily. USED TO CHECK BLOOD SUGAR TID   latanoprost (XALATAN) 0.005 % ophthalmic solution Place 1 drop into both eyes at bedtime.   loratadine (CLARITIN) 10 MG tablet Take 10 mg by mouth daily as needed for allergies (seasonal).   modafinil (PROVIGIL) 100 MG tablet Take 25 mg by mouth daily as needed (Takes  to help stay awake due to insomnia, takes at 6a if needed).   Multiple Vitamins-Minerals (MULTIVITAMIN ADULT) CHEW Chew 2 each by mouth 2 (two) times a week.   mupirocin ointment (BACTROBAN) 2 % Apply 1 Application topically 2 (two) times daily.   naloxone (NARCAN) nasal spray 4 mg/0.1 mL Place 1 spray into the nose daily as needed (overdose).   ondansetron (ZOFRAN) 4 MG tablet Take 1 tablet (4 mg total) by mouth every 6 (six) hours as needed for nausea or vomiting. Prior to colonoscopy prep   oxyCODONE (ROXICODONE) 15 MG immediate release tablet Take 15 mg by mouth 5 (five) times daily as  needed for pain.   terconazole (TERAZOL 3) 0.8 % vaginal cream Place 1 applicator vaginally at bedtime.   Estradiol 10 MCG TABS vaginal tablet Place 1 tablet (10 mcg total) vaginally at bedtime for 14 days, THEN 1 tablet (10 mcg total) 2 (two) times a week. (Patient not taking: Reported on 08/14/2022)   fluconazole (DIFLUCAN) 150 MG tablet Take 1 tablet (150 mg total) by mouth every 3 (three) days for 2 doses. (Patient not taking: Reported on 08/14/2022)   metroNIDAZOLE (FLAGYL) 500 MG tablet Take 1 tablet (500 mg total) by mouth 3 (three) times daily for 7 days. (Patient not taking: Reported on 08/14/2022)   pantoprazole (PROTONIX) 40 MG tablet Take 1 tablet (40 mg total) by mouth daily. (Patient not taking: Reported on 08/14/2022)   [DISCONTINUED] sucralfate (CARAFATE) 1 g tablet Take 1 tablet (1 g total) by mouth 4 (four) times daily -  before meals and at bedtime. Dissolve 1 tablet in 2-4 oz of water, drink as slurry   No facility-administered encounter medications on file as of 08/14/2022.    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   Bowel obstruction (HCC)    Depression with anxiety 02/09/2015   mild xanax prn   Diabetes mellitus (HCC) 07/18/2015   type 2   Diverticulitis    Dysrhythmia    abnormal ekgs worked up   Essential tremor 03/31/2016   Fibromyalgia    MS   Gait difficulty 02/09/2015   Gallstones    GERD (gastroesophageal reflux disease)    HLD (hyperlipidemia)    Hypersomnia 11/09/2015   Hypertension    no meds   Hyperthyroidism    patient states has no problems at  present-01/25/2019   Hypothyroidism    patient states no problems at present 01/25/2019   IBS (irritable bowel syndrome)    Insomnia 02/09/2015   Migraines    MS (multiple sclerosis) (HCC)    Multiple joint pain 03/31/2016   Numbness in both hands 03/18/2016   Other fatigue 02/09/2015   Peptic ulcer    Pneumonia    multiple times. Last time 2014   PONV (postoperative nausea and vomiting)    Urinary hesitancy 02/09/2015   Vision abnormalities    Past Surgical History:  Procedure Laterality Date   ABDOMINAL HYSTERECTOMY  1976   including oophorectomy, due to uterine cancer   APPENDECTOMY  1974   BIOPSY  01/31/2019   Procedure: BIOPSY;  Surgeon: Napoleon Form, MD;  Location: [facility];  Service: Endoscopy;;   CARDIAC CATHETERIZATION  2001/2006 In [place]. Normal per patient   CARPAL TUNNEL RELEASE Left    carpal tunnel surgery   CHOLECYSTECTOMY     2006   COLON SURGERY     [facility] x 2   COLONOSCOPY WITH PROPOFOL N/A 01/31/2019   Procedure: COLONOSCOPY WITH PROPOFOL;  Surgeon: Napoleon Form, MD;  Location: [facility];  Service: Endoscopy;  Laterality: N/A;   COLONOSCOPY WITH PROPOFOL N/A 04/24/2022   Procedure: COLONOSCOPY WITH PROPOFOL;  Surgeon: Napoleon Form, MD;  Location: [facility];  Service: Gastroenterology;  Laterality: N/A;   EUS N/A 04/09/2017   Procedure: UPPER ENDOSCOPIC ULTRASOUND (EUS) LINEAR;  Surgeon: Rachael Fee, MD;  Location: [facility];  Service: Endoscopy;  Laterality: N/A;   HERNIA REPAIR     INCISIONAL HERNIA REPAIR N/A 05/05/2022   Procedure: LAPAROSCOPIC VENTRAL HERNA REPAIR WITH MESH;  Surgeon: Fritzi Mandes, MD;  [facility];  Service: General;  Laterality: N/A;   INSERTION OF MESH N/A 05/05/2022   Procedure: INSERTION OF MESH;  Surgeon: Fritzi Mandes, MD;  Location: [facility] OR;  Service: General;  Laterality: N/A;   MENISCUS REPAIR Left    POLYPECTOMY  01/31/2019   Procedure: POLYPECTOMY;  Surgeon: Napoleon Form, MD;  Location: [facility];  Service: Endoscopy;;   POLYPECTOMY  04/24/2022   Procedure: POLYPECTOMY;  Surgeon: Napoleon Form, MD;  Location: [facility];  Service: Gastroenterology;;   SMALL INTESTINE SURGERY     toenail removal Right 12/2017   great toenail    Family History  Problem Relation Age of Onset   Heart disease Mother    Kidney disease Mother    Diabetes Mother    Other Father 21       murdered   Multiple sclerosis Daughter    Multiple sclerosis Other    Diabetes Sister    Alcohol abuse Brother        ETOH and marijuana   Diabetes Brother    Diabetes Sister    Diabetes Brother    ALS Brother    Social History   Socioeconomic History   Marital status: Married    Spouse name: Jimmie   Number of children: 4   Years of education: Not on file   Highest education level: 12th grade  Occupational History   Occupation: disabled  Tobacco Use   Smoking status: Former    Packs/day: 0.25    Years: 5.00    Additional pack years: 0.00    Total pack years: 1.25    Types: Cigarettes    Quit date: 1990    Years since quitting: 34.4    Passive exposure: Never   Smokeless tobacco: Never  Vaping Use   Vaping Use: Never used  Substance and Sexual Activity   Alcohol use: Not Currently    Comment: rarely   Drug use: Yes    Types: Oxycodone   Sexual activity: Yes  Other Topics Concern   Not on file  Social History Narrative   Pt is right-handed. She lives with her husband in a 2 story house. Pt avoids caffeine. She and her husband go to the gym, lately once weekly, previously 3 times a week.    Social Determinants of Health   Financial Resource Strain: Low Risk  (08/14/2022)   Overall Financial Resource Strain (CARDIA)    Difficulty of Paying Living Expenses: Not hard at all  Food Insecurity: No Food Insecurity (08/14/2022)   Hunger Vital Sign    Worried About Running  Out of Food in the Last Year: Never true    Ran Out of Food in the Last Year: Never true   Transportation Needs: No Transportation Needs (08/14/2022)   PRAPARE - Administrator, Civil Service (Medical): No    Lack of Transportation (Non-Medical): No  Physical Activity: Inactive (08/14/2022)   Exercise Vital Sign    Days of Exercise per Week: 0 days    Minutes of Exercise per Session: 0 min  Stress: No Stress Concern Present (08/14/2022)   [employer] of Occupational Health - Occupational Stress Questionnaire    Feeling of Stress : Only a little  Social Connections: Patient Declined (08/14/2022)   Social Connection and Isolation Panel [NHANES]    Frequency of Communication with Friends and Family: Patient declined    Frequency of Social Gatherings with Friends and Family: Patient declined    Attends Religious Services: Patient declined    Database administrator or Organizations: Patient declined    Attends Engineer, structural: Patient declined    Marital Status: Patient declined    Tobacco Counseling Counseling given: Not Answered   Clinical Intake:  Pre-visit preparation completed: Yes  Pain : 0-10 Pain Score: 5  Pain Type: Chronic pain Pain Location: Generalized Pain Descriptors / Indicators: Aching Pain Onset: More than a month ago Pain Frequency: Constant     Nutritional Status: BMI 25 -29 Overweight Nutritional Risks: None Diabetes: Yes  How often do you need to have someone help you when you read instructions, pamphlets, or other written materials from your doctor or pharmacy?: 1 - Never  Diabetic? Yes Nutrition Risk Assessment:  Has the patient had any N/V/D within the last 2 months?  No  Does the patient have any non-healing wounds?  No  Has the patient had any unintentional weight loss or weight gain?  No   Diabetes:  Is the patient diabetic?  Yes  If diabetic, was a CBG obtained today?  No  Did the patient bring in their glucometer from home?  No  How often do you monitor your CBG's? frequently.   Financial  Strains and Diabetes Management:  Are you having any financial strains with the device, your supplies or your medication? No .  Does the patient want to be seen by Chronic Care Management for management of their diabetes?  No  Would the patient like to be referred to a Nutritionist or for Diabetic Management?  No   Diabetic Exams:  Diabetic Eye Exam: Completed 02/06/2022 Diabetic Foot Exam: Completed 06/06/2022  Interpreter Needed?: No  Information entered by :: NAllen LPN   Activities of Daily Living    08/14/2022   10:58 AM 04/30/2022    1:40 PM  In your present state of health, do you have any difficulty performing the following activities:  Hearing? 0   Vision? 0   Difficulty concentrating or making decisions? 0   Walking or climbing stairs? 1   Dressing or bathing? 0   Doing errands, shopping? 1 0  Comment husband Biomedical scientist and eating ? N   Using the Toilet? N   In the past six months, have you accidently leaked urine? N   Do you have problems with loss of bowel control? N   Managing your Medications? N   Managing your Finances? N   Housekeeping or managing your Housekeeping? Y     Patient Care Team: Rema Fendt, NP as PCP - General (Nurse Practitioner) Lance Muss  S, MD as PCP - Cardiology (Cardiology)  Indicate any recent Medical Services you may have received from other than [facility] providers in the past year (date may be approximate).     Assessment:   This is a routine wellness examination for Collie.  Hearing/Vision screen Vision Screening - Comments:: Regular eye exams, [facility]  Dietary issues and exercise activities discussed: Current Exercise Habits: The patient does not participate in regular exercise at present   Goals Addressed             This Visit's Progress    Patient Stated       08/14/2022, drink more water       Depression Screen    08/14/2022   10:55 AM 08/08/2022    3:26 PM 08/07/2022    2:14 PM  03/11/2022    9:45 AM 08/29/2021    1:59 PM 05/24/2021    1:52 PM 05/03/2021   11:28 AM  PHQ 2/9 Scores  PHQ - 2 Score 3 0 0 2 0 1 0  PHQ- 9 Score 9 0  7 0 12     Fall Risk    08/14/2022   10:55 AM 08/08/2022    3:26 PM 08/07/2022    2:14 PM 03/11/2022    9:45 AM 05/03/2021   11:28 AM  Fall Risk   Falls in the past year? 0 0 0 0 1  Number falls in past yr: 0 0  0 0  Injury with Fall? 0 0  0 0  Risk for fall due to : Medication side effect   No Fall Risks Impaired balance/gait  Follow up Falls prevention discussed;Education provided;Falls evaluation completed   Falls evaluation completed Falls evaluation completed    FALL RISK PREVENTION PERTAINING TO THE HOME:  Any stairs in or around the home? Yes  If so, are there any without handrails? No  Home free of loose throw rugs in walkways, pet beds, electrical cords, etc? Yes  Adequate lighting in your home to reduce risk of falls? Yes   ASSISTIVE DEVICES UTILIZED TO PREVENT FALLS:  Life alert? No  Use of a cane, walker or w/c? No  Grab bars in the bathroom? No  Shower chair or bench in shower? No  Elevated toilet seat or a handicapped toilet? No   TIMED UP AND GO:  Was the test performed? No .      Cognitive Function:        08/14/2022   11:00 AM 05/03/2021   11:28 AM  6CIT Screen  What Year? 0 points 0 points  What month? 0 points 0 points  What time? 0 points 0 points  Count back from 20 0 points 0 points  Months in reverse 0 points 0 points  Repeat phrase 2 points 0 points  Total Score 2 points 0 points    Immunizations Immunization History  Administered Date(s) Administered   Fluad Quad(high Dose 65+) 11/10/2021   Influenza, Seasonal, Injecte, Preservative Fre 12/23/2016   Influenza,inj,Quad PF,6+ Mos 10/27/2018, 11/21/2019   Influenza-Unspecified 11/21/2015, 12/01/2017, 11/09/2020   PFIZER(Purple Top)SARS-COV-2 Vaccination 05/10/2019, 05/31/2019, 03/17/2021   Pneumococcal Polysaccharide-23 11/25/2017, 05/03/2021    Tdap 11/21/2019   Zoster Recombinat (Shingrix) 01/13/2018, 10/27/2018    TDAP status: Up to date  Flu Vaccine status: Up to date  Pneumococcal vaccine status: Up to date  Covid-19 vaccine status: Completed vaccines  Qualifies for Shingles Vaccine? Yes   Zostavax completed Yes   Shingrix Completed?: Yes  Screening Tests Health  Maintenance  Topic Date Due   Diabetic kidney evaluation - Urine ACR  10/19/2021   COVID-19 Vaccine (4 - 2023-24 season) 11/01/2021   Pneumonia Vaccine 76+ Years old (2 of 2 - PCV) 05/04/2022   INFLUENZA VACCINE  10/02/2022   HEMOGLOBIN A1C  12/19/2022   OPHTHALMOLOGY EXAM  02/07/2023   FOOT EXAM  06/06/2023   Diabetic kidney evaluation - eGFR measurement  08/08/2023   Medicare Annual Wellness (AWV)  08/14/2023   MAMMOGRAM  11/15/2023   DTaP/Tdap/Td (2 - Td or Tdap) 11/20/2029   Colonoscopy  04/24/2032   DEXA SCAN  Completed   Hepatitis C Screening  Completed   Zoster Vaccines- Shingrix  Completed   HPV VACCINES  Aged Out    Health Maintenance  Health Maintenance Due  Topic Date Due   Diabetic kidney evaluation - Urine ACR  10/19/2021   COVID-19 Vaccine (4 - 2023-24 season) 11/01/2021   Pneumonia Vaccine 30+ Years old (2 of 2 - PCV) 05/04/2022    Colorectal cancer screening: Type of screening: Colonoscopy. Completed 04/24/2022. Repeat every 10 years  Mammogram status: Completed 11/14/2021. Repeat every year  Bone Density status: Completed 11/14/2021.   Lung Cancer Screening: (Low Dose CT Chest recommended if Age 55-80 years, 30 pack-year currently smoking OR have quit w/in 15years.) does not qualify.   Lung Cancer Screening Referral: no  Additional Screening:  Hepatitis C Screening: does qualify; Completed 11/21/2019  Vision Screening: Recommended annual ophthalmology exams for early detection of glaucoma and other disorders of the eye. Is the patient up to date with their annual eye exam?  Yes  Who is the provider or what is the name  of the office in which the patient attends annual eye exams? [facility] If pt is not established with a provider, would they like to be referred to a provider to establish care? No .   Dental Screening: Recommended annual dental exams for proper oral hygiene  Community Resource Referral / Chronic Care Management: CRR required this visit?  No   CCM required this visit?  No      Plan:     I have personally reviewed and noted the following in the patient's chart:   Medical and social history Use of alcohol, tobacco or illicit drugs  Current medications and supplements including opioid prescriptions. Patient is currently taking opioid prescriptions. Information provided to patient regarding non-opioid alternatives. Patient advised to discuss non-opioid treatment plan with their provider. Functional ability and status Nutritional status Physical activity Advanced directives List of other physicians Hospitalizations, surgeries, and ER visits in previous 12 months Vitals Screenings to include cognitive, depression, and falls Referrals and appointments  In addition, I have reviewed and discussed with patient certain preventive protocols, quality metrics, and best practice recommendations. A written personalized care plan for preventive services as well as general preventive health recommendations were provided to patient.     Barb Merino, LPN   1/61/0960   Nurse Notes: none  Due to this being a virtual visit, the after visit summary with patients personalized plan was offered to patient via mail or my-chart. Patient would like to access on my-chart

## 2022-08-18 ENCOUNTER — Encounter: Payer: Self-pay | Admitting: Internal Medicine

## 2022-08-21 DIAGNOSIS — Z6829 Body mass index (BMI) 29.0-29.9, adult: Secondary | ICD-10-CM | POA: Diagnosis not present

## 2022-08-21 DIAGNOSIS — T402X5A Adverse effect of other opioids, initial encounter: Secondary | ICD-10-CM | POA: Diagnosis not present

## 2022-08-21 DIAGNOSIS — Z79899 Other long term (current) drug therapy: Secondary | ICD-10-CM | POA: Diagnosis not present

## 2022-08-21 DIAGNOSIS — M545 Low back pain, unspecified: Secondary | ICD-10-CM | POA: Diagnosis not present

## 2022-08-21 DIAGNOSIS — K5903 Drug induced constipation: Secondary | ICD-10-CM | POA: Diagnosis not present

## 2022-08-21 DIAGNOSIS — Z9181 History of falling: Secondary | ICD-10-CM | POA: Diagnosis not present

## 2022-08-30 ENCOUNTER — Encounter: Payer: Self-pay | Admitting: Internal Medicine

## 2022-09-02 DIAGNOSIS — F410 Panic disorder [episodic paroxysmal anxiety] without agoraphobia: Secondary | ICD-10-CM | POA: Diagnosis not present

## 2022-09-02 DIAGNOSIS — F411 Generalized anxiety disorder: Secondary | ICD-10-CM | POA: Diagnosis not present

## 2022-09-02 DIAGNOSIS — F331 Major depressive disorder, recurrent, moderate: Secondary | ICD-10-CM | POA: Diagnosis not present

## 2022-09-02 DIAGNOSIS — F4321 Adjustment disorder with depressed mood: Secondary | ICD-10-CM | POA: Diagnosis not present

## 2022-09-18 DIAGNOSIS — H401112 Primary open-angle glaucoma, right eye, moderate stage: Secondary | ICD-10-CM | POA: Diagnosis not present

## 2022-09-18 DIAGNOSIS — H40052 Ocular hypertension, left eye: Secondary | ICD-10-CM | POA: Diagnosis not present

## 2022-09-23 DIAGNOSIS — N1831 Chronic kidney disease, stage 3a: Secondary | ICD-10-CM | POA: Diagnosis not present

## 2022-09-23 DIAGNOSIS — Z79899 Other long term (current) drug therapy: Secondary | ICD-10-CM | POA: Diagnosis not present

## 2022-09-23 DIAGNOSIS — K5903 Drug induced constipation: Secondary | ICD-10-CM | POA: Diagnosis not present

## 2022-09-23 DIAGNOSIS — E663 Overweight: Secondary | ICD-10-CM | POA: Diagnosis not present

## 2022-09-23 DIAGNOSIS — T402X5A Adverse effect of other opioids, initial encounter: Secondary | ICD-10-CM | POA: Diagnosis not present

## 2022-09-23 DIAGNOSIS — Z0000 Encounter for general adult medical examination without abnormal findings: Secondary | ICD-10-CM | POA: Diagnosis not present

## 2022-09-23 DIAGNOSIS — E1140 Type 2 diabetes mellitus with diabetic neuropathy, unspecified: Secondary | ICD-10-CM | POA: Diagnosis not present

## 2022-09-23 DIAGNOSIS — M545 Low back pain, unspecified: Secondary | ICD-10-CM | POA: Diagnosis not present

## 2022-09-23 DIAGNOSIS — F3342 Major depressive disorder, recurrent, in full remission: Secondary | ICD-10-CM | POA: Diagnosis not present

## 2022-09-23 DIAGNOSIS — G47429 Narcolepsy in conditions classified elsewhere without cataplexy: Secondary | ICD-10-CM | POA: Diagnosis not present

## 2022-09-23 DIAGNOSIS — E559 Vitamin D deficiency, unspecified: Secondary | ICD-10-CM | POA: Diagnosis not present

## 2022-09-23 DIAGNOSIS — Z9181 History of falling: Secondary | ICD-10-CM | POA: Diagnosis not present

## 2022-09-23 DIAGNOSIS — M129 Arthropathy, unspecified: Secondary | ICD-10-CM | POA: Diagnosis not present

## 2022-09-25 DIAGNOSIS — Z79899 Other long term (current) drug therapy: Secondary | ICD-10-CM | POA: Diagnosis not present

## 2022-09-26 ENCOUNTER — Other Ambulatory Visit: Payer: Self-pay

## 2022-09-26 DIAGNOSIS — B3731 Acute candidiasis of vulva and vagina: Secondary | ICD-10-CM

## 2022-09-26 MED ORDER — TERCONAZOLE 0.8 % VA CREA
1.0000 | TOPICAL_CREAM | Freq: Every day | VAGINAL | 2 refills | Status: DC
Start: 2022-09-26 — End: 2022-11-26

## 2022-09-29 DIAGNOSIS — F4321 Adjustment disorder with depressed mood: Secondary | ICD-10-CM | POA: Diagnosis not present

## 2022-09-29 DIAGNOSIS — F410 Panic disorder [episodic paroxysmal anxiety] without agoraphobia: Secondary | ICD-10-CM | POA: Diagnosis not present

## 2022-09-29 DIAGNOSIS — F411 Generalized anxiety disorder: Secondary | ICD-10-CM | POA: Diagnosis not present

## 2022-09-29 DIAGNOSIS — F331 Major depressive disorder, recurrent, moderate: Secondary | ICD-10-CM | POA: Diagnosis not present

## 2022-10-03 ENCOUNTER — Telehealth: Payer: Self-pay | Admitting: Dietician

## 2022-10-03 NOTE — Telephone Encounter (Signed)
Patient called and states that she is having a problem getting her Dexcom G7 prescription from [facility] due to name differences on her insurance cards and it is now in the verification phase for coverage.  Confirmed this when I called [facility].  Told [facility] how her names are printed on her Tricare and Medicare cards. Patient's Dexcom G7 sensor is expiring tomorrow.  She states that she would prefer to use a receiver. Left a Dexcom G7 receiver:  Lot [id number] and a Sensor:  Lot [id number], Expiration 05/01/2023 for patient at the front desk. Patient states that she will pick this up today.  Oran Rein, RD, LDN, CDCES

## 2022-10-08 ENCOUNTER — Ambulatory Visit (INDEPENDENT_AMBULATORY_CARE_PROVIDER_SITE_OTHER): Payer: Medicare Other | Admitting: Family

## 2022-10-08 ENCOUNTER — Encounter: Payer: Medicare Other | Attending: Internal Medicine | Admitting: Nutrition

## 2022-10-08 VITALS — BP 123/79 | HR 73 | Temp 97.7°F | Ht 62.5 in | Wt 157.8 lb

## 2022-10-08 DIAGNOSIS — E559 Vitamin D deficiency, unspecified: Secondary | ICD-10-CM

## 2022-10-08 DIAGNOSIS — M79605 Pain in left leg: Secondary | ICD-10-CM | POA: Diagnosis not present

## 2022-10-08 DIAGNOSIS — Z794 Long term (current) use of insulin: Secondary | ICD-10-CM | POA: Insufficient documentation

## 2022-10-08 DIAGNOSIS — M79604 Pain in right leg: Secondary | ICD-10-CM | POA: Diagnosis not present

## 2022-10-08 DIAGNOSIS — E1142 Type 2 diabetes mellitus with diabetic polyneuropathy: Secondary | ICD-10-CM | POA: Insufficient documentation

## 2022-10-08 NOTE — Progress Notes (Signed)
Pt wants blood work for potassium to be checked.

## 2022-10-08 NOTE — Progress Notes (Signed)
Patient ID: Courtney [name], female    DOB: 1955-06-15  MRN: [id number]  CC: Leg Pain   Subjective: Courtney [name] is a 67 y.o. female who presents for leg pain.   Her concerns today include:  Reports she was having bilateral lower leg pain which has since resolved since beginning Vitamin D supplement prescribed by her Pain Management provider. She denies associated red flag symptoms. Requests to have potassium checked and states in the past this caused bilateral lower leg pain. No further issues/concerns for discussion today.   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   Sebaceous cyst 05/03/2021   Seborrheic dermatitis of scalp 05/03/2021   Biceps tendonitis on left 04/30/2021   Stage 3a chronic kidney disease (HCC) 10/21/2020   Type 2 diabetes mellitus with stage 3a chronic kidney disease, with long-term current use of insulin (HCC) 10/21/2020   Type 2 diabetes mellitus with diabetic polyneuropathy, with long-term current use of insulin (HCC) 06/20/2020   Dyslipidemia, goal LDL below 70 04/10/2020   Episode of recurrent major depressive disorder (HCC) 04/10/2020   Pain in left shoulder 04/04/2020   Vaginal dryness, menopausal 07/07/2019   Tremor 05/30/2019   Memory loss 05/03/2019   Hx of adenomatous colonic polyps 03/16/2019   History of colonic polyps 03/16/2019   Fracture of distal phalanx of right ring finger 03/15/2019   Facet arthritis, degenerative, 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   Polyp of transverse colon    Special screening for malignant neoplasms, colon    Polyp of sigmoid colon    Anxiety 09/09/2018   GERD (gastroesophageal reflux disease) 09/09/2018   Cervicalgia 08/10/2018    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   Laryngopharyngeal reflux (LPR) 05/14/2018   Throat pain 05/14/2018   Loss of weight    RLQ abdominal pain 03/27/2017   Bloating 03/27/2017   Abnormal finding on imaging 03/27/2017   Dilation of biliary tract 03/27/2017   Nausea 03/27/2017   Chronic sinusitis 03/19/2017   Multiple joint pain 03/31/2016   Essential tremor 03/31/2016   Numbness in both hands 03/18/2016   Hypersomnia 11/09/2015   Diabetes mellitus (HCC) 07/18/2015   Abnormal ECG 07/18/2015   Gait difficulty 02/09/2015   Chronic left shoulder pain 02/09/2015   Generalized anxiety disorder 02/09/2015   Numbness 02/09/2015   Insomnia 02/09/2015   Urinary hesitancy 02/09/2015   Other fatigue 02/09/2015   Fibromyalgia 02/09/2015   Depression with anxiety 02/09/2015   Essential hypertension 09/01/2011   Coronary artery disease involving native coronary artery of native heart without angina pectoris 09/01/2011   MS (multiple sclerosis) (HCC) 09/01/2011   Parkinson's disease 09/01/2011   Type 2 diabetes mellitus without complications (HCC) 09/01/2011   Chronic pain 09/01/2011     Current Outpatient Medications on File Prior to Visit  Medication Sig Dispense Refill   acetaminophen (TYLENOL) 500 MG tablet Take 1,000 mg by mouth every 8 (eight) hours as needed for headache.     ALPRAZolam (XANAX) 1 MG tablet Take 0.5-1 mg by mouth 3 (three) times daily as needed for anxiety.     Blood Glucose Monitoring Suppl (FREESTYLE LITE) w/Device KIT 1 Device by Does not apply route daily in the afternoon. 1 kit 0  Continuous Blood Gluc Sensor (DEXCOM G7 SENSOR) MISC 1 Device by Does not apply route as directed. 9 each 3   empagliflozin (JARDIANCE) 25 MG TABS tablet Take 1 tablet (25 mg total) by mouth daily. 90 tablet 3   Estradiol 10 MCG TABS vaginal tablet Place 1 tablet (10 mcg total) vaginally at bedtime for 14 days, THEN 1 tablet (10 mcg total) 2 (two) times a week. 30 tablet 3    glucose blood (FREESTYLE LITE) test strip 1 each by Other route in the morning, at noon, in the evening, and at bedtime. Use as instructed 400 each 3   insulin aspart (NOVOLOG FLEXPEN) 100 UNIT/ML FlexPen Max daily 30 units 3 mL 0   insulin glargine (LANTUS SOLOSTAR) 100 UNIT/ML Solostar Pen Inject 12 Units into the skin daily. 3 mL 0   Insulin Pen Needle 32G X 4 MM MISC 1 Device by Does not apply route in the morning, at noon, in the evening, and at bedtime. 400 each 3   ketoconazole (NIZORAL) 2 % cream Apply 1 Application topically daily. 60 g 0   Lancets (FREESTYLE) lancets 1 Device by Misc.(Non-Drug; Combo Route) route daily. USED TO CHECK BLOOD SUGAR TID 200 each 3   latanoprost (XALATAN) 0.005 % ophthalmic solution Place 1 drop into both eyes at bedtime.     loratadine (CLARITIN) 10 MG tablet Take 10 mg by mouth daily as needed for allergies (seasonal).     modafinil (PROVIGIL) 100 MG tablet Take 25 mg by mouth daily as needed (Takes to help stay awake due to insomnia, takes at 6a if needed).     mupirocin ointment (BACTROBAN) 2 % Apply 1 Application topically 2 (two) times daily. 22 g 0   naloxone (NARCAN) nasal spray 4 mg/0.1 mL Place 1 spray into the nose daily as needed (overdose).     ondansetron (ZOFRAN) 4 MG tablet Take 1 tablet (4 mg total) by mouth every 6 (six) hours as needed for nausea or vomiting. Prior to colonoscopy prep 15 tablet 0   oxyCODONE (ROXICODONE) 15 MG immediate release tablet Take 15 mg by mouth 5 (five) times daily as needed for pain.     pantoprazole (PROTONIX) 40 MG tablet Take 1 tablet (40 mg total) by mouth daily. 90 tablet 3   terconazole (TERAZOL 3) 0.8 % vaginal cream Place 1 applicator vaginally at bedtime. 60 g 2   hydrocortisone cream 0.5 % Apply 1 Application topically 2 (two) times daily. (Patient not taking: Reported on 10/08/2022) 30 g 0   Multiple Vitamins-Minerals (MULTIVITAMIN ADULT) CHEW Chew 2 each by mouth 2 (two) times a week. (Patient not taking:  Reported on 10/08/2022)     [DISCONTINUED] sucralfate (CARAFATE) 1 g tablet Take 1 tablet (1 g total) by mouth 4 (four) times daily -  before meals and at bedtime. Dissolve 1 tablet in 2-4 oz of water, drink as slurry 120 tablet 1   No current facility-administered medications on file prior to visit.    Allergies  Allergen Reactions   Ibuprofen Other (See Comments)    Developed a ulcer     Methylprednisolone Anaphylaxis, Shortness Of Breath and Palpitations   Naproxen Other (See Comments)    Developed a ulcer    Nsaids Other (See Comments)    Pt gets Ulcers    Prednisone Anaphylaxis, Shortness Of Breath and Palpitations   Bactrim [Sulfamethoxazole-Trimethoprim] Other (See Comments)    "blotchiness" redness, face swelling   Biaxin [Clarithromycin] Swelling   Sulfa Antibiotics Swelling and  Other (See Comments)    Angioedema    Ozempic (0.25 Or 0.5 Mg-Dose) [Semaglutide(0.25 Or 0.5mg -Dos)] Rash    Social History   Socioeconomic History   Marital status: Married    Spouse name: Jimmie   Number of children: 4   Years of education: Not on file   Highest education level: 12th grade  Occupational History   Occupation: disabled  Tobacco Use   Smoking status: Former    Current packs/day: 0.00    Average packs/day: 0.3 packs/day for 5.0 years (1.3 ttl pk-yrs)    Types: Cigarettes    Start date: 58    Quit date: 1990    Years since quitting: 34.6    Passive exposure: Never   Smokeless tobacco: Never  Vaping Use   Vaping status: Never Used  Substance and Sexual Activity   Alcohol use: Not Currently    Comment: rarely   Drug use: Yes    Types: Oxycodone   Sexual activity: Yes  Other Topics Concern   Not on file  Social History Narrative   Pt is right-handed. She lives with her husband in a 2 story house. Pt avoids caffeine. She and her husband go to the gym, lately once weekly, previously 3 times a week.    Social Determinants of Health   Financial Resource Strain: Low  Risk  (10/08/2022)   Overall Financial Resource Strain (CARDIA)    Difficulty of Paying Living Expenses: Not very hard  Food Insecurity: No Food Insecurity (10/08/2022)   Hunger Vital Sign    Worried About Running Out of Food in the Last Year: Never true    Ran Out of Food in the Last Year: Never true  Transportation Needs: No Transportation Needs (10/08/2022)   PRAPARE - Administrator, Civil Service (Medical): No    Lack of Transportation (Non-Medical): No  Physical Activity: Insufficiently Active (10/08/2022)   Exercise Vital Sign    Days of Exercise per Week: 2 days    Minutes of Exercise per Session: 20 min  Stress: Stress Concern Present (10/08/2022)   [employer] of Occupational Health - Occupational Stress Questionnaire    Feeling of Stress : To some extent  Social Connections: Moderately Integrated (10/08/2022)   Social Connection and Isolation Panel [NHANES]    Frequency of Communication with Friends and Family: Three times a week    Frequency of Social Gatherings with Friends and Family: Patient declined    Attends Religious Services: More than 4 times per year    Active Member of [employer] or Organizations: No    Attends Banker Meetings: Patient declined    Marital Status: Married  Catering manager Violence: Not At Risk (08/14/2022)   Humiliation, Afraid, Rape, and Kick questionnaire    Fear of Current or Ex-Partner: No    Emotionally Abused: No    Physically Abused: No    Sexually Abused: No    Family History  Problem Relation Age of Onset   Heart disease Mother    Kidney disease Mother    Diabetes Mother    Other Father 25       murdered   Multiple sclerosis Daughter    Multiple sclerosis Other    Diabetes Sister    Alcohol abuse Brother        ETOH and marijuana   Diabetes Brother    Diabetes Sister    Diabetes Brother    ALS Brother     Past Surgical History:  Procedure Laterality  Date   ABDOMINAL HYSTERECTOMY  1976   including  oophorectomy, due to uterine cancer   APPENDECTOMY  1974   BIOPSY  01/31/2019   Procedure: BIOPSY;  Surgeon: Napoleon Form, MD;  Location: [facility];  Service: Endoscopy;;   CARDIAC CATHETERIZATION     2001/2006 In [place]. Normal per patient   CARPAL TUNNEL RELEASE Left    carpal tunnel surgery   CHOLECYSTECTOMY     2006   COLON SURGERY     [facility] x 2   COLONOSCOPY WITH PROPOFOL N/A 01/31/2019   Procedure: COLONOSCOPY WITH PROPOFOL;  Surgeon: Napoleon Form, MD;  Location: [facility];  Service: Endoscopy;  Laterality: N/A;   COLONOSCOPY WITH PROPOFOL N/A 04/24/2022   Procedure: COLONOSCOPY WITH PROPOFOL;  Surgeon: Napoleon Form, MD;  Location: [facility];  Service: Gastroenterology;  Laterality: N/A;   EUS N/A 04/09/2017   Procedure: UPPER ENDOSCOPIC ULTRASOUND (EUS) LINEAR;  Surgeon: Rachael Fee, MD;  Location: [facility];  Service: Endoscopy;  Laterality: N/A;   HERNIA REPAIR     INCISIONAL HERNIA REPAIR N/A 05/05/2022   Procedure: LAPAROSCOPIC VENTRAL HERNA REPAIR WITH MESH;  Surgeon: Fritzi Mandes, MD;  Location: [facility] OR;  Service: General;  Laterality: N/A;   INSERTION OF MESH N/A 05/05/2022   Procedure: INSERTION OF MESH;  Surgeon: Fritzi Mandes, MD;  Location: [facility] OR;  Service: General;  Laterality: N/A;   MENISCUS REPAIR Left    POLYPECTOMY  01/31/2019   Procedure: POLYPECTOMY;  Surgeon: Napoleon Form, MD;  Location: [facility];  Service: Endoscopy;;   POLYPECTOMY  04/24/2022   Procedure: POLYPECTOMY;  Surgeon: Napoleon Form, MD;  Location: [facility];  Service: Gastroenterology;;   SMALL INTESTINE SURGERY     toenail removal Right 12/2017   great toenail     ROS: Review of Systems Negative except as stated above  PHYSICAL EXAM: BP 123/79   Pulse 73   Temp 97.7 F (36.5 C) (Oral)   Ht 5' 2.5" (1.588 m)   Wt 157 lb 12.8 oz (71.6 kg)   SpO2 91%   BMI 28.40 kg/m   Physical Exam HENT:     Head: Normocephalic and  atraumatic.     Nose: Nose normal.     Mouth/Throat:     Mouth: Mucous membranes are moist.     Pharynx: Oropharynx is clear.  Eyes:     Extraocular Movements: Extraocular movements intact.     Conjunctiva/sclera: Conjunctivae normal.     Pupils: Pupils are equal, round, and reactive to light.  Cardiovascular:     Rate and Rhythm: Normal rate and regular rhythm.     Pulses: Normal pulses.     Heart sounds: Normal heart sounds.  Pulmonary:     Effort: Pulmonary effort is normal.     Breath sounds: Normal breath sounds.  Musculoskeletal:        General: Normal range of motion.     Right shoulder: Normal.     Left shoulder: Normal.     Right upper arm: Normal.     Left upper arm: Normal.     Right elbow: Normal.     Left elbow: Normal.     Right forearm: Normal.     Left forearm: Normal.     Right wrist: Normal.     Left wrist: Normal.     Right hand: Normal.     Left hand: Normal.     Cervical back: Normal, normal range of motion  and neck supple.     Thoracic back: Normal.     Lumbar back: Normal.     Right hip: Normal.     Left hip: Normal.     Right upper leg: Normal.     Left upper leg: Normal.     Right knee: Normal.     Left knee: Normal.     Right lower leg: Normal.     Left lower leg: Normal.     Right ankle: Normal.     Left ankle: Normal.     Right foot: Normal.     Left foot: Normal.  Neurological:     General: No focal deficit present.     Mental Status: She is alert and oriented to person, place, and time.  Psychiatric:        Mood and Affect: Mood normal.        Behavior: Behavior normal.     ASSESSMENT AND PLAN: 1. Pain in both lower extremities 2. Vitamin D deficiency - Resolved.  - Continue Vitamin D as prescribed.  - Routine screening.  - Follow-up with primary provider as scheduled.  - Potassium  Patient was given the opportunity to ask questions.  Patient verbalized understanding of the plan and was able to repeat key elements of the  plan. Patient was given clear instructions to go to Emergency Department or return to medical center if symptoms don't improve, worsen, or new problems develop.The patient verbalized understanding.   Orders Placed This Encounter  Procedures   Potassium   Follow-up with primary provider as scheduled.   Rema Fendt, NP

## 2022-10-09 ENCOUNTER — Telehealth: Payer: Self-pay | Admitting: Family

## 2022-10-09 NOTE — Progress Notes (Signed)
Patient reported that she is having starting the Dexcom G7 sensors.  She has both her phone and the receiver with her. The app would not work because she could not remember her user name or password.  This was fixed for her and these were saved into her phone.  Alerts/alarms:  low: less than 70, High: over 250.   She prefers to use the receiver and the phone, despite me telling her that the signal will not be able to go to both of them.  She prefers to use the receiver at home and the phone when going out.   The clarity app was also restarted with new user name and password and re inked to [facility].  We reviewed the steps to follow when the sensor has ended both on her phone and reader, and she agreed to do both of these with every sensor change.  She had no final questions

## 2022-10-09 NOTE — Patient Instructions (Signed)
To change sensor, you must go to both the receiver and phone to do this. Read over directions on receiver manual for doing this.

## 2022-10-09 NOTE — Telephone Encounter (Signed)
Pt. Given lab results, verbalizes understanding. 

## 2022-10-21 ENCOUNTER — Ambulatory Visit: Payer: Medicare Other | Admitting: Physician Assistant

## 2022-10-21 DIAGNOSIS — Z9181 History of falling: Secondary | ICD-10-CM | POA: Diagnosis not present

## 2022-10-21 DIAGNOSIS — M79604 Pain in right leg: Secondary | ICD-10-CM | POA: Diagnosis not present

## 2022-10-21 DIAGNOSIS — R768 Other specified abnormal immunological findings in serum: Secondary | ICD-10-CM | POA: Diagnosis not present

## 2022-10-21 DIAGNOSIS — M545 Low back pain, unspecified: Secondary | ICD-10-CM | POA: Diagnosis not present

## 2022-10-21 DIAGNOSIS — F3342 Major depressive disorder, recurrent, in full remission: Secondary | ICD-10-CM | POA: Diagnosis not present

## 2022-10-21 DIAGNOSIS — K5903 Drug induced constipation: Secondary | ICD-10-CM | POA: Diagnosis not present

## 2022-10-21 DIAGNOSIS — Z79899 Other long term (current) drug therapy: Secondary | ICD-10-CM | POA: Diagnosis not present

## 2022-10-21 DIAGNOSIS — N1831 Chronic kidney disease, stage 3a: Secondary | ICD-10-CM | POA: Diagnosis not present

## 2022-10-21 DIAGNOSIS — E1140 Type 2 diabetes mellitus with diabetic neuropathy, unspecified: Secondary | ICD-10-CM | POA: Diagnosis not present

## 2022-10-21 DIAGNOSIS — G47429 Narcolepsy in conditions classified elsewhere without cataplexy: Secondary | ICD-10-CM | POA: Diagnosis not present

## 2022-10-21 DIAGNOSIS — M79605 Pain in left leg: Secondary | ICD-10-CM | POA: Diagnosis not present

## 2022-10-21 DIAGNOSIS — E663 Overweight: Secondary | ICD-10-CM | POA: Diagnosis not present

## 2022-10-23 ENCOUNTER — Ambulatory Visit: Payer: Medicare Other | Admitting: Dietician

## 2022-10-23 DIAGNOSIS — Z79899 Other long term (current) drug therapy: Secondary | ICD-10-CM | POA: Diagnosis not present

## 2022-10-23 DIAGNOSIS — E1122 Type 2 diabetes mellitus with diabetic chronic kidney disease: Secondary | ICD-10-CM | POA: Diagnosis not present

## 2022-10-23 DIAGNOSIS — N1831 Chronic kidney disease, stage 3a: Secondary | ICD-10-CM | POA: Diagnosis not present

## 2022-10-23 DIAGNOSIS — N2581 Secondary hyperparathyroidism of renal origin: Secondary | ICD-10-CM | POA: Diagnosis not present

## 2022-10-23 DIAGNOSIS — D631 Anemia in chronic kidney disease: Secondary | ICD-10-CM | POA: Diagnosis not present

## 2022-10-23 DIAGNOSIS — I129 Hypertensive chronic kidney disease with stage 1 through stage 4 chronic kidney disease, or unspecified chronic kidney disease: Secondary | ICD-10-CM | POA: Diagnosis not present

## 2022-10-24 ENCOUNTER — Ambulatory Visit: Payer: Medicare Other

## 2022-10-24 ENCOUNTER — Ambulatory Visit
Admission: EM | Admit: 2022-10-24 | Discharge: 2022-10-24 | Disposition: A | Discharge status: Home / Self Care | Payer: Medicare Other | Attending: Internal Medicine | Admitting: Internal Medicine

## 2022-10-24 DIAGNOSIS — M79642 Pain in left hand: Secondary | ICD-10-CM

## 2022-10-24 DIAGNOSIS — S62355A Nondisplaced fracture of shaft of fourth metacarpal bone, left hand, initial encounter for closed fracture: Secondary | ICD-10-CM | POA: Diagnosis not present

## 2022-10-24 DIAGNOSIS — M25532 Pain in left wrist: Secondary | ICD-10-CM

## 2022-10-24 NOTE — ED Provider Notes (Signed)
[facility]    CSN: [id number] Arrival date & time: 10/24/22  1706      History   Chief Complaint Chief Complaint  Patient presents with   Hand Pain    HPI Courtney [name] is a 67 y.o. female.   Patient presents with left wrist and hand pain after an approximately 25 pound speaker fell on her eft hand approximately 1 hour prior to arrival to urgent care.  Reports that she was on the floor moving things when it tipped over onto her hand.  Denies numbness or tingling.  She has a small pinpoint abrasion present to the dorsal surface of the hand.  Reports majority of pain is present in the dorsal surface of the hand and in the left fourth digit. Patient does not take any blood thinning medications.    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   Bowel obstruction (HCC)    Depression with anxiety 02/09/2015   mild xanax prn   Diabetes mellitus (HCC) 07/18/2015   type 2   Diverticulitis    Dysrhythmia    abnormal ekgs worked up   Essential tremor 03/31/2016   Fibromyalgia    MS   Gait difficulty 02/09/2015   Gallstones    GERD (gastroesophageal reflux disease)    HLD (hyperlipidemia)    Hypersomnia 11/09/2015   Hypertension    no meds   Hyperthyroidism    patient states has no problems at present-01/25/2019   Hypothyroidism    patient states no problems at present 01/25/2019   IBS (irritable bowel syndrome)    Insomnia 02/09/2015   Migraines    MS (multiple sclerosis) (HCC)    Multiple joint pain 03/31/2016   Numbness in both hands 03/18/2016   Other fatigue 02/09/2015   Peptic ulcer    Pneumonia    multiple times. 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   Sebaceous cyst 05/03/2021   Seborrheic dermatitis of scalp 05/03/2021   Biceps tendonitis on left 04/30/2021   Stage 3a chronic kidney disease (HCC) 10/21/2020   Type 2 diabetes mellitus with stage 3a chronic kidney disease, with long-term current use of insulin (HCC) 10/21/2020   Type 2 diabetes mellitus with diabetic polyneuropathy, with long-term current use of insulin (HCC) 06/20/2020   Dyslipidemia, goal LDL below 70 04/10/2020   Episode of recurrent major depressive disorder (HCC) 04/10/2020   Pain in left shoulder 04/04/2020   Vaginal dryness, menopausal 07/07/2019   Tremor 05/30/2019   Memory loss 05/03/2019   Hx of adenomatous colonic polyps 03/16/2019   History of colonic polyps 03/16/2019   Fracture of distal phalanx of right ring finger 03/15/2019   Facet arthritis, degenerative, 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   Polyp of transverse colon    Special screening for malignant neoplasms, colon    Polyp of sigmoid colon    Anxiety 09/09/2018  GERD (gastroesophageal reflux disease) 09/09/2018   Cervicalgia 08/10/2018   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   Laryngopharyngeal reflux (LPR) 05/14/2018   Throat pain 05/14/2018   Loss of weight    RLQ abdominal pain 03/27/2017   Bloating 03/27/2017   Abnormal finding on imaging 03/27/2017   Dilation of biliary tract 03/27/2017   Nausea 03/27/2017   Chronic sinusitis 03/19/2017   Multiple joint pain 03/31/2016   Essential tremor 03/31/2016   Numbness in both hands 03/18/2016   Hypersomnia 11/09/2015   Diabetes mellitus (HCC) 07/18/2015   Abnormal ECG 07/18/2015   Gait difficulty 02/09/2015   Chronic left shoulder pain 02/09/2015   Generalized  anxiety disorder 02/09/2015   Numbness 02/09/2015   Insomnia 02/09/2015   Urinary hesitancy 02/09/2015   Other fatigue 02/09/2015   Fibromyalgia 02/09/2015   Depression with anxiety 02/09/2015   Essential hypertension 09/01/2011   Coronary artery disease involving native coronary artery of native heart without angina pectoris 09/01/2011   MS (multiple sclerosis) (HCC) 09/01/2011   Parkinson's disease 09/01/2011   Type 2 diabetes mellitus without complications (HCC) 09/01/2011   Chronic pain 09/01/2011    Past Surgical History:  Procedure Laterality Date   ABDOMINAL HYSTERECTOMY  1976   including oophorectomy, due to uterine cancer   APPENDECTOMY  1974   BIOPSY  01/31/2019   Procedure: BIOPSY;  Surgeon: Napoleon Form, MD;  Location: [facility];  Service: Endoscopy;;   CARDIAC CATHETERIZATION     2001/2006 In [place]. Normal per patient   CARPAL TUNNEL RELEASE Left    carpal tunnel surgery   CHOLECYSTECTOMY     2006   COLON SURGERY     [facility] x 2   COLONOSCOPY WITH PROPOFOL N/A 01/31/2019   Procedure: COLONOSCOPY WITH PROPOFOL;  Surgeon: Napoleon Form, MD;  Location: [facility];  Service: Endoscopy;  Laterality: N/A;   COLONOSCOPY WITH PROPOFOL N/A 04/24/2022   Procedure: COLONOSCOPY WITH PROPOFOL;  Surgeon: Napoleon Form, MD;  Location: [facility];  Service: Gastroenterology;  Laterality: N/A;   EUS N/A 04/09/2017   Procedure: UPPER ENDOSCOPIC ULTRASOUND (EUS) LINEAR;  Surgeon: Rachael Fee, MD;  Location: [facility];  Service: Endoscopy;  Laterality: N/A;   HERNIA REPAIR     INCISIONAL HERNIA REPAIR N/A 05/05/2022   Procedure: LAPAROSCOPIC VENTRAL HERNA REPAIR WITH MESH;  Surgeon: Fritzi Mandes, MD;  Location[facility];  Service: General;  Laterality: N/A;   INSERTION OF MESH N/A 05/05/2022   Procedure: INSERTION OF MESH;  Surgeon: Fritzi Mandes, MD;  Location: [facility] OR;  Service: General;  Laterality: N/A;   MENISCUS REPAIR Left    POLYPECTOMY   01/31/2019   Procedure: POLYPECTOMY;  Surgeon: Napoleon Form, MD;  Location: [facility];  Service: Endoscopy;;   POLYPECTOMY  04/24/2022   Procedure: POLYPECTOMY;  Surgeon: Napoleon Form, MD;  Location: [facility];  Service: Gastroenterology;;   SMALL INTESTINE SURGERY     toenail removal Right 12/2017   great toenail     OB History     Gravida  2   Para      Term      Preterm      AB      Living  2      SAB      IAB      Ectopic      Multiple      Live Births  2  Home Medications    Prior to Admission medications   Medication Sig Start Date End Date Taking? Authorizing Provider  acetaminophen (TYLENOL) 500 MG tablet Take 1,000 mg by mouth every 8 (eight) hours as needed for headache.    [provider]  ALPRAZolam Prudy Feeler) 1 MG tablet Take 0.5-1 mg by mouth 3 (three) times daily as needed for anxiety.    [provider]  Blood Glucose Monitoring Suppl (FREESTYLE LITE) w/Device KIT 1 Device by Does not apply route daily in the afternoon. 06/19/22   Shamleffer, Konrad Dolores, MD  Continuous Blood Gluc Sensor (DEXCOM G7 SENSOR) MISC 1 Device by Does not apply route as directed. 06/06/22   Shamleffer, Konrad Dolores, MD  empagliflozin (JARDIANCE) 25 MG TABS tablet Take 1 tablet (25 mg total) by mouth daily. 06/06/22   Shamleffer, Konrad Dolores, MD  Estradiol 10 MCG TABS vaginal tablet Place 1 tablet (10 mcg total) vaginally at bedtime for 14 days, THEN 1 tablet (10 mcg total) 2 (two) times a week. 03/18/22 04/01/23  Lennart Pall, MD  glucose blood (FREESTYLE LITE) test strip 1 each by Other route in the morning, at noon, in the evening, and at bedtime. Use as instructed 06/20/22   Shamleffer, Konrad Dolores, MD  hydrocortisone cream 0.5 % Apply 1 Application topically 2 (two) times daily. Patient not taking: Reported on 10/08/2022 06/04/22   Shamleffer, Konrad Dolores, MD  insulin aspart (NOVOLOG FLEXPEN) 100 UNIT/ML  FlexPen Max daily 30 units 06/20/22   Shamleffer, Konrad Dolores, MD  insulin glargine (LANTUS SOLOSTAR) 100 UNIT/ML Solostar Pen Inject 12 Units into the skin daily. 06/20/22   Shamleffer, Konrad Dolores, MD  Insulin Pen Needle 32G X 4 MM MISC 1 Device by Does not apply route in the morning, at noon, in the evening, and at bedtime. 06/19/22   Shamleffer, Konrad Dolores, MD  ketoconazole (NIZORAL) 2 % cream Apply 1 Application topically daily. 07/30/22   Rema Fendt, NP  Lancets (FREESTYLE) lancets 1 Device by Misc.(Non-Drug; Combo Route) route daily. USED TO CHECK BLOOD SUGAR TID 06/04/22   Shamleffer, Konrad Dolores, MD  latanoprost (XALATAN) 0.005 % ophthalmic solution Place 1 drop into both eyes at bedtime. 02/06/22   [provider]  loratadine (CLARITIN) 10 MG tablet Take 10 mg by mouth daily as needed for allergies (seasonal). 01/05/20   [provider]  modafinil (PROVIGIL) 100 MG tablet Take 25 mg by mouth daily as needed (Takes to help stay awake due to insomnia, takes at 6a if needed). 02/19/22   [provider]  Multiple Vitamins-Minerals (MULTIVITAMIN ADULT) CHEW Chew 2 each by mouth 2 (two) times a week. Patient not taking: Reported on 10/08/2022    [provider]  mupirocin ointment (BACTROBAN) 2 % Apply 1 Application topically 2 (two) times daily. 07/30/22   Rema Fendt, NP  naloxone [facility]) nasal spray 4 mg/0.1 mL Place 1 spray into the nose daily as needed (overdose).    [provider]  ondansetron (ZOFRAN) 4 MG tablet Take 1 tablet (4 mg total) by mouth every 6 (six) hours as needed for nausea or vomiting. Prior to colonoscopy prep 05/05/22   Fritzi Mandes, MD  oxyCODONE (ROXICODONE) 15 MG immediate release tablet Take 15 mg by mouth 5 (five) times daily as needed for pain. 08/02/21   [provider]  pantoprazole (PROTONIX) 40 MG tablet Take 1 tablet (40 mg total) by mouth daily. 01/29/22   Esterwood, Amy S, PA-C  terconazole  (TERAZOL 3)  0.8 % vaginal cream Place 1 applicator vaginally at bedtime. 09/26/22   Rema Fendt, NP  sucralfate (CARAFATE) 1 g tablet Take 1 tablet (1 g total) by mouth 4 (four) times daily -  before meals and at bedtime. Dissolve 1 tablet in 2-4 oz of water, drink as slurry 08/10/19 02/29/20  Napoleon Form, MD    Family History Family History  Problem Relation Age of Onset   Heart disease Mother    Kidney disease Mother    Diabetes Mother    Other Father 66       murdered   Multiple sclerosis Daughter    Multiple sclerosis Other    Diabetes Sister    Alcohol abuse Brother        ETOH and marijuana   Diabetes Brother    Diabetes Sister    Diabetes Brother    ALS Brother     Social History Social History   Tobacco Use   Smoking status: Former    Current packs/day: 0.00    Average packs/day: 0.3 packs/day for 5.0 years (1.3 ttl pk-yrs)    Types: Cigarettes    Start date: 14    Quit date: 1990    Years since quitting: 34.6    Passive exposure: Never   Smokeless tobacco: Never  Vaping Use   Vaping status: Never Used  Substance Use Topics   Alcohol use: Not Currently    Comment: rarely   Drug use: Yes    Types: Oxycodone     Allergies   Ibuprofen, Methylprednisolone, Naproxen, Nsaids, Prednisone, Bactrim [sulfamethoxazole-trimethoprim], Biaxin [clarithromycin], Sulfa antibiotics, and Ozempic (0.25 or 0.5 mg-dose) [semaglutide(0.25 or 0.5mg -dos)]   Review of Systems Review of Systems Per HPI  Physical Exam Triage Vital Signs ED Triage Vitals  Encounter Vitals Group     BP 10/24/22 1800 (!) 164/88     Systolic BP Percentile --      Diastolic BP Percentile --      Pulse Rate 10/24/22 1800 75     Resp 10/24/22 1800 16     Temp 10/24/22 1800 98.4 F (36.9 C)     Temp Source 10/24/22 1800 Oral     SpO2 10/24/22 1800 98 %     Weight --      Height --      Head Circumference --      Peak Flow --      Pain Score 10/24/22 1801 10     Pain Loc --       Pain Education --      Exclude from Growth Chart --    No data found.  Updated Vital Signs BP (!) 164/88 (BP Location: Left Arm)   Pulse 75   Temp 98.4 F (36.9 C) (Oral)   Resp 16   SpO2 98%   Visual Acuity Right Eye Distance:   Left Eye Distance:   Bilateral Distance:    Right Eye Near:   Left Eye Near:    Bilateral Near:     Physical Exam Constitutional:      General: She is not in acute distress.    Appearance: Normal appearance. She is not toxic-appearing or diaphoretic.  HENT:     Head: Normocephalic and atraumatic.  Eyes:     Extraocular Movements: Extraocular movements intact.     Conjunctiva/sclera: Conjunctivae normal.  Pulmonary:     Effort: Pulmonary effort is normal.  Musculoskeletal:     Comments: Patient has tenderness to palpation to radial portion  of wrist and dorsal surface of left hand.  Mild swelling present to the dorsal surface of left hand with pinpoint superficial abrasion to to the center of this area as well.  patient is able to move fingers but has limited range of motion due to pain.  Capillary refill and pulses intact.  Patient has surgical scar to wrist from previous surgery.  Neurological:     General: No focal deficit present.     Mental Status: She is alert and oriented to person, place, and time. Mental status is at baseline.  Psychiatric:        Mood and Affect: Mood normal.        Behavior: Behavior normal.        Thought Content: Thought content normal.        Judgment: Judgment normal.      UC Treatments / Results  Labs (all labs ordered are listed, but only abnormal results are displayed) Labs Reviewed - No data to display  EKG   Radiology DG Hand Complete Left  Result Date: 10/24/2022 CLINICAL DATA:  Radio speaker fell on hand.  Left hand pain. EXAM: LEFT HAND - COMPLETE 3+ VIEW COMPARISON:  None Available. FINDINGS: Nondisplaced fracture is seen through the 4th metacarpal midshaft. No other fractures identified. No  evidence of dislocation. IMPRESSION: Nondisplaced 4th metacarpal shaft fracture. Electronically Signed   By: Danae Orleans M.D.   On: 10/24/2022 19:00   DG Wrist Complete Left  Result Date: 10/24/2022 CLINICAL DATA:  Radio speaker fell on wrist.  Left wrist pain. EXAM: LEFT WRIST - COMPLETE 3+ VIEW COMPARISON:  Left hand radiographs also obtained today FINDINGS: There is no evidence of fracture or dislocation. There is no evidence of arthropathy or other focal bone abnormality in the wrist. Nondisplaced fracture of the 4th metacarpal shaft is noted, as seen on separate hand radiographs obtained today. IMPRESSION: No evidence of left wrist fracture. Nondisplaced fracture of the 4th metacarpal shaft. Electronically Signed   By: Danae Orleans M.D.   On: 10/24/2022 18:59    Procedures Procedures (including critical care time)  Medications Ordered in UC Medications - No data to display  Initial Impression / Assessment and Plan / UC Course  I have reviewed the triage vital signs and the nursing notes.  Pertinent labs & imaging results that were available during my care of the patient were reviewed by me and considered in my medical decision making (see chart for details).     X-ray of hand and wrist showing nondisplaced fracture of the right fourth metacarpal.  Ulnar gutter splint placed by clinical staff and patient advised to follow-up with hand specialty at provided contact information for further evaluation and management.  Very minimal pinpoint abrasion present to dorsal surface of hand but no closure necessary.  Wound cleansed by clinical staff prior to splint application.  Not considered an open fracture given how minimal this is.  Will defer antibiotics given minimal abrasion.  Tetanus vaccine is up-to-date in 2021 per patient chart.  Advised no pushing, pulling, lifting until otherwise advised.  Patient already prescribed oxycodone for pain.  Advised supportive care and ice application.  Patient  verbalized understanding and was agreeable with plan. Final Clinical Impressions(s) / UC Diagnoses   Final diagnoses:  Nondisplaced fracture of shaft of fourth metacarpal bone, left hand, initial encounter for closed fracture     Discharge Instructions      You have broken the fourth bone in your hand.  Follow-up with  hand specialty for further evaluation.  Splint applied today in urgent care.     ED Prescriptions   None    I have reviewed the PDMP during this encounter.   Gustavus Bryant, [place] 10/24/22 1950

## 2022-10-24 NOTE — ED Triage Notes (Signed)
Pt presents with left hand pain after a radio speaker fall on her hand. Pt states it hurts to move her hand.

## 2022-10-24 NOTE — Discharge Instructions (Signed)
You have broken the fourth bone in your hand.  Follow-up with hand specialty for further evaluation.  Splint applied today in urgent care.

## 2022-10-26 DIAGNOSIS — S62305A Unspecified fracture of fourth metacarpal bone, left hand, initial encounter for closed fracture: Secondary | ICD-10-CM | POA: Diagnosis not present

## 2022-10-26 DIAGNOSIS — W19XXXA Unspecified fall, initial encounter: Secondary | ICD-10-CM | POA: Diagnosis not present

## 2022-10-26 DIAGNOSIS — M79643 Pain in unspecified hand: Secondary | ICD-10-CM | POA: Insufficient documentation

## 2022-10-26 DIAGNOSIS — S62304A Unspecified fracture of fourth metacarpal bone, right hand, initial encounter for closed fracture: Secondary | ICD-10-CM | POA: Insufficient documentation

## 2022-10-26 DIAGNOSIS — S62325A Displaced fracture of shaft of fourth metacarpal bone, left hand, initial encounter for closed fracture: Secondary | ICD-10-CM | POA: Diagnosis not present

## 2022-10-27 DIAGNOSIS — M79602 Pain in left arm: Secondary | ICD-10-CM | POA: Diagnosis not present

## 2022-10-27 DIAGNOSIS — S62355A Nondisplaced fracture of shaft of fourth metacarpal bone, left hand, initial encounter for closed fracture: Secondary | ICD-10-CM | POA: Diagnosis not present

## 2022-10-31 ENCOUNTER — Ambulatory Visit: Payer: Self-pay | Admitting: *Deleted

## 2022-10-31 DIAGNOSIS — M79602 Pain in left arm: Secondary | ICD-10-CM | POA: Diagnosis not present

## 2022-10-31 NOTE — Telephone Encounter (Signed)
Message from Benton F sent at 10/31/2022  2:58 PM EDT  Pt is calling in because she broke her hand last Friday. Pt says she has a cast on but has been experiencing spasms in the broken hand and wants to know what she can do for it.    Call History  Contact Date/Time Type Contact Phone/Fax User  10/31/2022 02:57 PM EDT Phone (Incoming) Jasmine Awe, Courtney [name] (Self) [phone] Judie Petit) Colon Flattery M

## 2022-11-04 DIAGNOSIS — F4323 Adjustment disorder with mixed anxiety and depressed mood: Secondary | ICD-10-CM | POA: Diagnosis not present

## 2022-11-04 DIAGNOSIS — F4321 Adjustment disorder with depressed mood: Secondary | ICD-10-CM | POA: Diagnosis not present

## 2022-11-04 DIAGNOSIS — F411 Generalized anxiety disorder: Secondary | ICD-10-CM | POA: Diagnosis not present

## 2022-11-05 ENCOUNTER — Ambulatory Visit: Payer: Self-pay | Admitting: *Deleted

## 2022-11-05 NOTE — Telephone Encounter (Signed)
Message from Vancleave C sent at 11/05/2022 11:31 AM EDT  Summary: cold and flu like symptoms   The patient is concerned that they may have been exposed to COVID 19  The patient has recently been traveling and experienced headache, vomiting, sore throat, congestion and drainage  The patient has not taken an at home test  The patient would ike to speak with a member of staff when possible          Call History  Contact Date/Time Type Contact Phone/Fax User  11/05/2022 11:26 AM EDT Phone (Incoming) Jasmine Awe, Eymi Corriveau (Self) [phone] Judie Petit) Coley, Everette A   Reason for Disposition  [1] HIGH RISK patient (e.g., weak immune system, age > 64 years, obesity with BMI 30 or higher, pregnant, chronic lung disease or other chronic medical condition) AND [2] COVID symptoms (e.g., cough, fever)  (Exceptions: Already seen by PCP and no new or worsening symptoms.)  Answer Assessment - Initial Assessment Questions 1. COVID-19 DIAGNOSIS: "How do you know that you have COVID?" (e.g., positive lab test or self-test, diagnosed by doctor or NP/PA, symptoms after exposure).     I'm sure I have Covid.   My husband has it. I'm feeling better.   2. COVID-19 EXPOSURE: "Was there any known exposure to COVID before the symptoms began?" CDC Definition of close contact: within 6 feet (2 meters) for a total of 15 minutes or more over a 24-hour period.      Yes to my husband who is positive. I broke my hand and I need to get the cast off on Friday.   I wasn't sure what to do.   They are changing the cast.    After I got the mucus off my stomach.   I feel better. I have bad headache.   My throat is sore every day as my usual.   My head is killing me.   I'm taking Tylenol for the headache and body aches.   I've been vomiting due to the mucus in my stomach.   3. ONSET: "When did the COVID-19 symptoms start?"      Last night 8:00 PM.     4. WORST SYMPTOM: "What is your worst symptom?" (e.g., cough, fever, shortness  of breath, muscle aches)     Headache 5. COUGH: "Do you have a cough?" If Yes, ask: "How bad is the cough?"       I'm coughing a little bit.    I took some Lawyer and that is helping me a lot.   My throat is scratchy.    6. FEVER: "Do you have a fever?" If Yes, ask: "What is your temperature, how was it measured, and when did it start?"     I take so 7. RESPIRATORY STATUS: "Describe your breathing?" (e.g., normal; shortness of breath, wheezing, unable to speak)      No 8. BETTER-SAME-WORSE: "Are you getting better, staying the same or getting worse compared to yesterday?"  If getting worse, ask, "In what way?"     Worse today 9. OTHER SYMPTOMS: "Do you have any other symptoms?"  (e.g., chills, fatigue, headache, loss of smell or taste, muscle pain, sore throat)     Headache, body aches, scratchy throat, congestion,  10. HIGH RISK DISEASE: "Do you have any chronic medical problems?" (e.g., asthma, heart or lung disease, weak immune system, obesity, etc.)       I have diabetes 11. VACCINE: "Have you had the COVID-19 vaccine?" If Yes, ask: "  Which one, how many shots, when did you get it?"       Not asked 12. PREGNANCY: "Is there any chance you are pregnant?" "When was your last menstrual period?"       N/A due to age 21. O2 SATURATION MONITOR:  "Do you use an oxygen saturation monitor (pulse oximeter) at home?" If Yes, ask "What is your reading (oxygen level) today?" "What is your usual oxygen saturation reading?" (e.g., 95%)       N/A  Protocols used: Coronavirus (COVID-19) Diagnosed or Suspected-A-AH

## 2022-11-05 NOTE — Telephone Encounter (Signed)
  Chief Complaint: Covid Symptoms: congestion, sore throat, headache, body aches.  Vomiting  Frequency: Since last night Pertinent Negatives: Patient denies N/A Disposition: [] ED /[] Urgent Care (no appt availability in office) / [] Appointment(In office/virtual)/ []  [facility]/ [] Home Care/ [] Refused Recommended Disposition /[] [facility]/ [x]  Follow-up with PCP Additional Notes: No appts available with either provider.    She does not want to see Dr. Andrey Campanile.  I have sent a message to see if she can be worked in.

## 2022-11-07 NOTE — Telephone Encounter (Signed)
Called patient left voicemail stating we do not have anymore appointments until end of month,and advised to go to urgent care if symptoms worsen or emergency room

## 2022-11-13 ENCOUNTER — Other Ambulatory Visit: Payer: Self-pay

## 2022-11-13 ENCOUNTER — Emergency Department (HOSPITAL_COMMUNITY): Payer: Medicare Other

## 2022-11-13 ENCOUNTER — Inpatient Hospital Stay (HOSPITAL_COMMUNITY)
Admission: EM | Admit: 2022-11-13 | Discharge: 2022-11-17 | DRG: 389 | Disposition: A | Discharge status: Home / Self Care | Payer: Medicare Other | Attending: Internal Medicine | Admitting: Internal Medicine

## 2022-11-13 DIAGNOSIS — E876 Hypokalemia: Secondary | ICD-10-CM | POA: Diagnosis not present

## 2022-11-13 DIAGNOSIS — N179 Acute kidney failure, unspecified: Secondary | ICD-10-CM | POA: Insufficient documentation

## 2022-11-13 DIAGNOSIS — Z820 Family history of epilepsy and other diseases of the nervous system: Secondary | ICD-10-CM

## 2022-11-13 DIAGNOSIS — Z8249 Family history of ischemic heart disease and other diseases of the circulatory system: Secondary | ICD-10-CM

## 2022-11-13 DIAGNOSIS — I10 Essential (primary) hypertension: Secondary | ICD-10-CM | POA: Diagnosis not present

## 2022-11-13 DIAGNOSIS — E119 Type 2 diabetes mellitus without complications: Secondary | ICD-10-CM | POA: Diagnosis not present

## 2022-11-13 DIAGNOSIS — Z811 Family history of alcohol abuse and dependence: Secondary | ICD-10-CM | POA: Diagnosis not present

## 2022-11-13 DIAGNOSIS — Z886 Allergy status to analgesic agent status: Secondary | ICD-10-CM

## 2022-11-13 DIAGNOSIS — F3289 Other specified depressive episodes: Secondary | ICD-10-CM | POA: Diagnosis not present

## 2022-11-13 DIAGNOSIS — J45909 Unspecified asthma, uncomplicated: Secondary | ICD-10-CM | POA: Diagnosis present

## 2022-11-13 DIAGNOSIS — F32A Depression, unspecified: Secondary | ICD-10-CM | POA: Diagnosis present

## 2022-11-13 DIAGNOSIS — Z881 Allergy status to other antibiotic agents status: Secondary | ICD-10-CM

## 2022-11-13 DIAGNOSIS — K8689 Other specified diseases of pancreas: Secondary | ICD-10-CM | POA: Diagnosis not present

## 2022-11-13 DIAGNOSIS — K56609 Unspecified intestinal obstruction, unspecified as to partial versus complete obstruction: Principal | ICD-10-CM

## 2022-11-13 DIAGNOSIS — G35 Multiple sclerosis: Secondary | ICD-10-CM | POA: Diagnosis present

## 2022-11-13 DIAGNOSIS — Z8711 Personal history of peptic ulcer disease: Secondary | ICD-10-CM

## 2022-11-13 DIAGNOSIS — Z8542 Personal history of malignant neoplasm of other parts of uterus: Secondary | ICD-10-CM

## 2022-11-13 DIAGNOSIS — M797 Fibromyalgia: Secondary | ICD-10-CM | POA: Diagnosis present

## 2022-11-13 DIAGNOSIS — Z8719 Personal history of other diseases of the digestive system: Secondary | ICD-10-CM

## 2022-11-13 DIAGNOSIS — Z882 Allergy status to sulfonamides status: Secondary | ICD-10-CM | POA: Diagnosis not present

## 2022-11-13 DIAGNOSIS — E039 Hypothyroidism, unspecified: Secondary | ICD-10-CM | POA: Diagnosis present

## 2022-11-13 DIAGNOSIS — F411 Generalized anxiety disorder: Secondary | ICD-10-CM | POA: Diagnosis present

## 2022-11-13 DIAGNOSIS — Z833 Family history of diabetes mellitus: Secondary | ICD-10-CM

## 2022-11-13 DIAGNOSIS — Z87891 Personal history of nicotine dependence: Secondary | ICD-10-CM

## 2022-11-13 DIAGNOSIS — K5730 Diverticulosis of large intestine without perforation or abscess without bleeding: Secondary | ICD-10-CM | POA: Diagnosis not present

## 2022-11-13 DIAGNOSIS — Z9049 Acquired absence of other specified parts of digestive tract: Secondary | ICD-10-CM | POA: Diagnosis not present

## 2022-11-13 DIAGNOSIS — K219 Gastro-esophageal reflux disease without esophagitis: Secondary | ICD-10-CM | POA: Diagnosis present

## 2022-11-13 DIAGNOSIS — J9811 Atelectasis: Secondary | ICD-10-CM | POA: Diagnosis not present

## 2022-11-13 DIAGNOSIS — K565 Intestinal adhesions [bands], unspecified as to partial versus complete obstruction: Principal | ICD-10-CM | POA: Diagnosis present

## 2022-11-13 DIAGNOSIS — Z794 Long term (current) use of insulin: Secondary | ICD-10-CM | POA: Diagnosis not present

## 2022-11-13 DIAGNOSIS — E1122 Type 2 diabetes mellitus with diabetic chronic kidney disease: Secondary | ICD-10-CM | POA: Diagnosis present

## 2022-11-13 DIAGNOSIS — N1831 Chronic kidney disease, stage 3a: Secondary | ICD-10-CM | POA: Diagnosis not present

## 2022-11-13 DIAGNOSIS — Z888 Allergy status to other drugs, medicaments and biological substances status: Secondary | ICD-10-CM | POA: Diagnosis not present

## 2022-11-13 DIAGNOSIS — R109 Unspecified abdominal pain: Secondary | ICD-10-CM | POA: Diagnosis not present

## 2022-11-13 DIAGNOSIS — R1033 Periumbilical pain: Secondary | ICD-10-CM | POA: Diagnosis not present

## 2022-11-13 DIAGNOSIS — E785 Hyperlipidemia, unspecified: Secondary | ICD-10-CM | POA: Diagnosis present

## 2022-11-13 DIAGNOSIS — N182 Chronic kidney disease, stage 2 (mild): Secondary | ICD-10-CM | POA: Diagnosis present

## 2022-11-13 DIAGNOSIS — Z4682 Encounter for fitting and adjustment of non-vascular catheter: Secondary | ICD-10-CM | POA: Diagnosis not present

## 2022-11-13 DIAGNOSIS — Z7984 Long term (current) use of oral hypoglycemic drugs: Secondary | ICD-10-CM | POA: Diagnosis not present

## 2022-11-13 DIAGNOSIS — K5651 Intestinal adhesions [bands], with partial obstruction: Secondary | ICD-10-CM | POA: Diagnosis not present

## 2022-11-13 DIAGNOSIS — Z90710 Acquired absence of both cervix and uterus: Secondary | ICD-10-CM

## 2022-11-13 DIAGNOSIS — Z79899 Other long term (current) drug therapy: Secondary | ICD-10-CM

## 2022-11-13 DIAGNOSIS — Z9889 Other specified postprocedural states: Secondary | ICD-10-CM | POA: Diagnosis not present

## 2022-11-13 DIAGNOSIS — F419 Anxiety disorder, unspecified: Secondary | ICD-10-CM | POA: Diagnosis present

## 2022-11-13 DIAGNOSIS — G43909 Migraine, unspecified, not intractable, without status migrainosus: Secondary | ICD-10-CM | POA: Diagnosis present

## 2022-11-13 DIAGNOSIS — I129 Hypertensive chronic kidney disease with stage 1 through stage 4 chronic kidney disease, or unspecified chronic kidney disease: Secondary | ICD-10-CM | POA: Diagnosis present

## 2022-11-13 DIAGNOSIS — Z841 Family history of disorders of kidney and ureter: Secondary | ICD-10-CM

## 2022-11-13 DIAGNOSIS — S62355A Nondisplaced fracture of shaft of fourth metacarpal bone, left hand, initial encounter for closed fracture: Secondary | ICD-10-CM | POA: Diagnosis not present

## 2022-11-13 LAB — COMPREHENSIVE METABOLIC PANEL
ALT: 16 U/L (ref 0–44)
AST: 23 U/L (ref 15–41)
Albumin: 4.5 g/dL (ref 3.5–5.0)
Alkaline Phosphatase: 64 U/L (ref 38–126)
Anion gap: 19 — ABNORMAL HIGH (ref 5–15)
BUN: 14 mg/dL (ref 8–23)
CO2: 26 mmol/L (ref 22–32)
Calcium: 9.9 mg/dL (ref 8.9–10.3)
Chloride: 97 mmol/L — ABNORMAL LOW (ref 98–111)
Creatinine, Ser: 1.16 mg/dL — ABNORMAL HIGH (ref 0.44–1.00)
GFR, Estimated: 52 mL/min — ABNORMAL LOW (ref >60)
Glucose, Bld: 175 mg/dL — ABNORMAL HIGH (ref 70–99)
Potassium: 4.5 mmol/L (ref 3.5–5.1)
Sodium: 142 mmol/L (ref 135–145)
Total Bilirubin: 1.1 mg/dL (ref 0.3–1.2)
Total Protein: 8 g/dL (ref 6.5–8.1)

## 2022-11-13 LAB — URINALYSIS, ROUTINE W REFLEX MICROSCOPIC
Bilirubin Urine: NEGATIVE
Glucose, UA: >=500 mg/dL — AB
Hgb urine dipstick: NEGATIVE
Ketones, ur: 20 mg/dL — AB
Leukocytes,Ua: NEGATIVE
Nitrite: NEGATIVE
Protein, ur: NEGATIVE mg/dL
Specific Gravity, Urine: 1.029 (ref 1.005–1.030)
pH: 5 (ref 5.0–8.0)

## 2022-11-13 LAB — CBC
HCT: 43.4 % (ref 36.0–46.0)
Hemoglobin: 13.9 g/dL (ref 12.0–15.0)
MCH: 27.1 pg (ref 26.0–34.0)
MCHC: 32 g/dL (ref 30.0–36.0)
MCV: 84.6 fL (ref 80.0–100.0)
Platelets: 231 10*3/uL (ref 150–400)
RBC: 5.13 MIL/uL — ABNORMAL HIGH (ref 3.87–5.11)
RDW: 12.8 % (ref 11.5–15.5)
WBC: 7.9 10*3/uL (ref 4.0–10.5)
nRBC: 0 % (ref 0.0–0.2)

## 2022-11-13 LAB — LIPASE, BLOOD: Lipase: 21 U/L (ref 11–51)

## 2022-11-13 MED ORDER — ACETAMINOPHEN 500 MG PO TABS
ORAL_TABLET | ORAL | Status: AC
  Filled 2022-11-13: qty 1

## 2022-11-13 MED ORDER — ACETAMINOPHEN 500 MG PO TABS
500.0000 mg | ORAL_TABLET | Freq: Once | ORAL | Status: AC
  Administered 2022-11-13: 500 mg via ORAL

## 2022-11-13 NOTE — ED Triage Notes (Signed)
Pt arrives to ED umbilical region abdominal pain x 1 day. Pt reports no vomiting or nausea but states that she is constipated. Pt with hx of stomach ulcer, hernia repair in March and previous hx of small bowel obstruction.

## 2022-11-14 ENCOUNTER — Emergency Department (HOSPITAL_COMMUNITY): Payer: Medicare Other

## 2022-11-14 ENCOUNTER — Encounter (HOSPITAL_COMMUNITY): Payer: Self-pay | Admitting: Internal Medicine

## 2022-11-14 ENCOUNTER — Inpatient Hospital Stay (HOSPITAL_COMMUNITY): Payer: Medicare Other

## 2022-11-14 DIAGNOSIS — E876 Hypokalemia: Secondary | ICD-10-CM | POA: Diagnosis not present

## 2022-11-14 DIAGNOSIS — Z833 Family history of diabetes mellitus: Secondary | ICD-10-CM | POA: Diagnosis not present

## 2022-11-14 DIAGNOSIS — Z7984 Long term (current) use of oral hypoglycemic drugs: Secondary | ICD-10-CM | POA: Diagnosis not present

## 2022-11-14 DIAGNOSIS — G35 Multiple sclerosis: Secondary | ICD-10-CM | POA: Diagnosis present

## 2022-11-14 DIAGNOSIS — I129 Hypertensive chronic kidney disease with stage 1 through stage 4 chronic kidney disease, or unspecified chronic kidney disease: Secondary | ICD-10-CM | POA: Diagnosis present

## 2022-11-14 DIAGNOSIS — Z882 Allergy status to sulfonamides status: Secondary | ICD-10-CM | POA: Diagnosis not present

## 2022-11-14 DIAGNOSIS — Z811 Family history of alcohol abuse and dependence: Secondary | ICD-10-CM | POA: Diagnosis not present

## 2022-11-14 DIAGNOSIS — F3289 Other specified depressive episodes: Secondary | ICD-10-CM | POA: Diagnosis not present

## 2022-11-14 DIAGNOSIS — E785 Hyperlipidemia, unspecified: Secondary | ICD-10-CM | POA: Diagnosis present

## 2022-11-14 DIAGNOSIS — K56609 Unspecified intestinal obstruction, unspecified as to partial versus complete obstruction: Principal | ICD-10-CM

## 2022-11-14 DIAGNOSIS — Z9889 Other specified postprocedural states: Secondary | ICD-10-CM

## 2022-11-14 DIAGNOSIS — N179 Acute kidney failure, unspecified: Secondary | ICD-10-CM | POA: Diagnosis present

## 2022-11-14 DIAGNOSIS — K5651 Intestinal adhesions [bands], with partial obstruction: Secondary | ICD-10-CM | POA: Diagnosis not present

## 2022-11-14 DIAGNOSIS — N1831 Chronic kidney disease, stage 3a: Secondary | ICD-10-CM

## 2022-11-14 DIAGNOSIS — F411 Generalized anxiety disorder: Secondary | ICD-10-CM

## 2022-11-14 DIAGNOSIS — I10 Essential (primary) hypertension: Secondary | ICD-10-CM | POA: Diagnosis not present

## 2022-11-14 DIAGNOSIS — Z8249 Family history of ischemic heart disease and other diseases of the circulatory system: Secondary | ICD-10-CM | POA: Diagnosis not present

## 2022-11-14 DIAGNOSIS — K8689 Other specified diseases of pancreas: Secondary | ICD-10-CM | POA: Diagnosis not present

## 2022-11-14 DIAGNOSIS — Z820 Family history of epilepsy and other diseases of the nervous system: Secondary | ICD-10-CM | POA: Diagnosis not present

## 2022-11-14 DIAGNOSIS — Z794 Long term (current) use of insulin: Secondary | ICD-10-CM | POA: Diagnosis not present

## 2022-11-14 DIAGNOSIS — J45909 Unspecified asthma, uncomplicated: Secondary | ICD-10-CM | POA: Diagnosis present

## 2022-11-14 DIAGNOSIS — Z8719 Personal history of other diseases of the digestive system: Secondary | ICD-10-CM | POA: Diagnosis not present

## 2022-11-14 DIAGNOSIS — F32A Depression, unspecified: Secondary | ICD-10-CM | POA: Diagnosis present

## 2022-11-14 DIAGNOSIS — E119 Type 2 diabetes mellitus without complications: Secondary | ICD-10-CM

## 2022-11-14 DIAGNOSIS — K565 Intestinal adhesions [bands], unspecified as to partial versus complete obstruction: Secondary | ICD-10-CM | POA: Diagnosis present

## 2022-11-14 DIAGNOSIS — E039 Hypothyroidism, unspecified: Secondary | ICD-10-CM | POA: Diagnosis present

## 2022-11-14 DIAGNOSIS — S62355A Nondisplaced fracture of shaft of fourth metacarpal bone, left hand, initial encounter for closed fracture: Secondary | ICD-10-CM | POA: Diagnosis not present

## 2022-11-14 DIAGNOSIS — Z881 Allergy status to other antibiotic agents status: Secondary | ICD-10-CM | POA: Diagnosis not present

## 2022-11-14 DIAGNOSIS — R1033 Periumbilical pain: Secondary | ICD-10-CM | POA: Diagnosis not present

## 2022-11-14 DIAGNOSIS — Z4682 Encounter for fitting and adjustment of non-vascular catheter: Secondary | ICD-10-CM | POA: Diagnosis not present

## 2022-11-14 DIAGNOSIS — J9811 Atelectasis: Secondary | ICD-10-CM | POA: Diagnosis not present

## 2022-11-14 DIAGNOSIS — Z9049 Acquired absence of other specified parts of digestive tract: Secondary | ICD-10-CM | POA: Diagnosis not present

## 2022-11-14 DIAGNOSIS — Z841 Family history of disorders of kidney and ureter: Secondary | ICD-10-CM | POA: Diagnosis not present

## 2022-11-14 DIAGNOSIS — E1122 Type 2 diabetes mellitus with diabetic chronic kidney disease: Secondary | ICD-10-CM | POA: Diagnosis present

## 2022-11-14 DIAGNOSIS — M797 Fibromyalgia: Secondary | ICD-10-CM | POA: Diagnosis present

## 2022-11-14 DIAGNOSIS — Z87891 Personal history of nicotine dependence: Secondary | ICD-10-CM | POA: Diagnosis not present

## 2022-11-14 DIAGNOSIS — Z888 Allergy status to other drugs, medicaments and biological substances status: Secondary | ICD-10-CM | POA: Diagnosis not present

## 2022-11-14 DIAGNOSIS — K5730 Diverticulosis of large intestine without perforation or abscess without bleeding: Secondary | ICD-10-CM | POA: Diagnosis not present

## 2022-11-14 DIAGNOSIS — Z8711 Personal history of peptic ulcer disease: Secondary | ICD-10-CM | POA: Diagnosis not present

## 2022-11-14 LAB — GLUCOSE, CAPILLARY
Glucose-Capillary: 162 mg/dL — ABNORMAL HIGH (ref 70–99)
Glucose-Capillary: 194 mg/dL — ABNORMAL HIGH (ref 70–99)
Glucose-Capillary: 201 mg/dL — ABNORMAL HIGH (ref 70–99)
Glucose-Capillary: 215 mg/dL — ABNORMAL HIGH (ref 70–99)
Glucose-Capillary: 218 mg/dL — ABNORMAL HIGH (ref 70–99)

## 2022-11-14 LAB — TROPONIN I (HIGH SENSITIVITY): Troponin I (High Sensitivity): 7 ng/L (ref <18)

## 2022-11-14 LAB — HIV ANTIBODY (ROUTINE TESTING W REFLEX): HIV Screen 4th Generation wRfx: NONREACTIVE

## 2022-11-14 LAB — CBG MONITORING, ED: Glucose-Capillary: 181 mg/dL — ABNORMAL HIGH (ref 70–99)

## 2022-11-14 LAB — VITAMIN B12: Vitamin B-12: 356 pg/mL (ref 180–914)

## 2022-11-14 MED ORDER — SODIUM CHLORIDE 0.9% FLUSH
3.0000 mL | Freq: Two times a day (BID) | INTRAVENOUS | Status: DC
  Administered 2022-11-17: 3 mL via INTRAVENOUS

## 2022-11-14 MED ORDER — HYDROMORPHONE HCL 1 MG/ML IJ SOLN
0.5000 mg | Freq: Once | INTRAMUSCULAR | Status: AC
  Administered 2022-11-14: 0.5 mg via INTRAVENOUS
  Filled 2022-11-14: qty 1

## 2022-11-14 MED ORDER — ACETAMINOPHEN 325 MG PO TABS
650.0000 mg | ORAL_TABLET | Freq: Four times a day (QID) | ORAL | Status: DC | PRN
  Administered 2022-11-16 – 2022-11-17 (×2): 650 mg via ORAL
  Filled 2022-11-14 (×3): qty 2

## 2022-11-14 MED ORDER — LACTATED RINGERS IV SOLN: INTRAVENOUS | Status: DC

## 2022-11-14 MED ORDER — ALPRAZOLAM 0.5 MG PO TABS
0.5000 mg | ORAL_TABLET | Freq: Three times a day (TID) | ORAL | Status: DC | PRN
  Administered 2022-11-14: 0.5 mg via ORAL
  Filled 2022-11-14: qty 2

## 2022-11-14 MED ORDER — INFLUENZA VAC A&B SURF ANT ADJ 0.5 ML IM SUSY
0.5000 mL | PREFILLED_SYRINGE | INTRAMUSCULAR | Status: DC
  Filled 2022-11-14 (×2): qty 0.5

## 2022-11-14 MED ORDER — ONDANSETRON HCL 4 MG/2ML IJ SOLN
4.0000 mg | Freq: Four times a day (QID) | INTRAMUSCULAR | Status: DC | PRN
  Administered 2022-11-14 – 2022-11-15 (×2): 4 mg via INTRAVENOUS
  Filled 2022-11-14 (×2): qty 2

## 2022-11-14 MED ORDER — SENNOSIDES-DOCUSATE SODIUM 8.6-50 MG PO TABS: 1.0000 | ORAL_TABLET | Freq: Every evening | ORAL | Status: DC | PRN

## 2022-11-14 MED ORDER — HYDROMORPHONE HCL 1 MG/ML IJ SOLN
1.0000 mg | INTRAMUSCULAR | Status: DC | PRN
  Administered 2022-11-15 – 2022-11-17 (×11): 1 mg via INTRAVENOUS
  Filled 2022-11-14 (×10): qty 1

## 2022-11-14 MED ORDER — DEXTROSE IN LACTATED RINGERS 5 % IV SOLN: INTRAVENOUS | Status: AC

## 2022-11-14 MED ORDER — SODIUM CHLORIDE 0.9% FLUSH: 3.0000 mL | INTRAVENOUS | Status: DC | PRN

## 2022-11-14 MED ORDER — HEPARIN SODIUM (PORCINE) 5000 UNIT/ML IJ SOLN
5000.0000 [IU] | Freq: Three times a day (TID) | INTRAMUSCULAR | Status: DC
  Administered 2022-11-14 – 2022-11-17 (×10): 5000 [IU] via SUBCUTANEOUS
  Filled 2022-11-14 (×10): qty 1

## 2022-11-14 MED ORDER — ALPRAZOLAM 0.25 MG PO TABS: 0.5000 mg | ORAL_TABLET | Freq: Three times a day (TID) | ORAL | Status: DC | PRN

## 2022-11-14 MED ORDER — LORAZEPAM 2 MG/ML IJ SOLN
1.0000 mg | Freq: Once | INTRAMUSCULAR | Status: AC
  Administered 2022-11-14: 1 mg via INTRAVENOUS
  Filled 2022-11-14: qty 1

## 2022-11-14 MED ORDER — ACETAMINOPHEN 650 MG RE SUPP
650.0000 mg | Freq: Four times a day (QID) | RECTAL | Status: DC | PRN
  Filled 2022-11-14: qty 1

## 2022-11-14 MED ORDER — HYDROMORPHONE HCL 1 MG/ML IJ SOLN
1.0000 mg | Freq: Once | INTRAMUSCULAR | Status: AC
  Administered 2022-11-14: 1 mg via INTRAVENOUS
  Filled 2022-11-14: qty 1

## 2022-11-14 MED ORDER — HYDRALAZINE HCL 20 MG/ML IJ SOLN: 10.0000 mg | Freq: Four times a day (QID) | INTRAMUSCULAR | Status: DC | PRN

## 2022-11-14 MED ORDER — MORPHINE SULFATE (PF) 2 MG/ML IV SOLN
1.0000 mg | INTRAVENOUS | Status: DC | PRN
  Administered 2022-11-14 (×3): 1 mg via INTRAVENOUS
  Filled 2022-11-14 (×4): qty 1

## 2022-11-14 MED ORDER — DEXTROSE 50 % IV SOLN: 1.0000 | INTRAVENOUS | Status: DC | PRN

## 2022-11-14 MED ORDER — SODIUM CHLORIDE 0.9 % IV SOLN: 250.0000 mL | INTRAVENOUS | Status: DC | PRN

## 2022-11-14 MED ORDER — GLUCAGON HCL RDNA (DIAGNOSTIC) 1 MG IJ SOLR: 1.0000 mg | INTRAMUSCULAR | Status: DC | PRN

## 2022-11-14 MED ORDER — SODIUM CHLORIDE 0.9% FLUSH
3.0000 mL | Freq: Two times a day (BID) | INTRAVENOUS | Status: DC
  Administered 2022-11-16 – 2022-11-17 (×3): 3 mL via INTRAVENOUS

## 2022-11-14 MED ORDER — HYDRALAZINE HCL 20 MG/ML IJ SOLN: 10.0000 mg | INTRAMUSCULAR | Status: DC | PRN

## 2022-11-14 MED ORDER — LACTATED RINGERS IV BOLUS
1000.0000 mL | Freq: Once | INTRAVENOUS | Status: AC
  Administered 2022-11-14: 1000 mL via INTRAVENOUS

## 2022-11-14 MED ORDER — METOPROLOL TARTRATE 5 MG/5ML IV SOLN: 5.0000 mg | INTRAVENOUS | Status: DC | PRN

## 2022-11-14 MED ORDER — ONDANSETRON HCL 4 MG/2ML IJ SOLN
4.0000 mg | Freq: Once | INTRAMUSCULAR | Status: AC
  Administered 2022-11-14: 4 mg via INTRAVENOUS
  Filled 2022-11-14: qty 2

## 2022-11-14 MED ORDER — DIATRIZOATE MEGLUMINE & SODIUM 66-10 % PO SOLN
90.0000 mL | Freq: Once | ORAL | Status: AC
  Administered 2022-11-14: 90 mL via ORAL
  Filled 2022-11-14 (×2): qty 90

## 2022-11-14 MED ORDER — TRAZODONE HCL 50 MG PO TABS: 50.0000 mg | ORAL_TABLET | Freq: Every evening | ORAL | Status: DC | PRN

## 2022-11-14 MED ORDER — IPRATROPIUM-ALBUTEROL 0.5-2.5 (3) MG/3ML IN SOLN: 3.0000 mL | RESPIRATORY_TRACT | Status: DC | PRN

## 2022-11-14 MED ORDER — INSULIN ASPART 100 UNIT/ML IJ SOLN
0.0000 [IU] | Freq: Four times a day (QID) | INTRAMUSCULAR | Status: DC
  Administered 2022-11-14 (×2): 3 [IU] via SUBCUTANEOUS
  Administered 2022-11-14 – 2022-11-15 (×3): 2 [IU] via SUBCUTANEOUS
  Administered 2022-11-15 – 2022-11-16 (×3): 3 [IU] via SUBCUTANEOUS
  Administered 2022-11-16 – 2022-11-17 (×5): 2 [IU] via SUBCUTANEOUS

## 2022-11-14 MED ORDER — ONDANSETRON HCL 4 MG PO TABS
4.0000 mg | ORAL_TABLET | Freq: Four times a day (QID) | ORAL | Status: DC | PRN
  Filled 2022-11-14: qty 1

## 2022-11-14 MED ORDER — HYDROMORPHONE HCL 1 MG/ML IJ SOLN
0.5000 mg | INTRAMUSCULAR | Status: DC | PRN
  Administered 2022-11-14: 0.5 mg via INTRAVENOUS
  Filled 2022-11-14: qty 0.5

## 2022-11-14 MED ORDER — IOHEXOL 350 MG/ML SOLN
75.0000 mL | Freq: Once | INTRAVENOUS | Status: AC | PRN
  Administered 2022-11-14: 75 mL via INTRAVENOUS

## 2022-11-14 MED ORDER — METOCLOPRAMIDE HCL 5 MG/ML IJ SOLN
10.0000 mg | Freq: Once | INTRAMUSCULAR | Status: AC
  Administered 2022-11-14: 10 mg via INTRAVENOUS
  Filled 2022-11-14: qty 2

## 2022-11-14 NOTE — ED Notes (Signed)
MD requested this RN to ask the pt if she would be willing to have an NG tube placed. Spoke with the pt regarding the NG tube, pt states she does not want one, states "those things are horrible".

## 2022-11-14 NOTE — Consult Note (Addendum)
Courtney [name] August 17, 1955  [id number].    Requesting MD: Nelson Chimes, MD Chief Complaint/Reason for Consult: SBO  HPI:  Courtney [name] is a 67 y/o F with a history of HTN, DM2, CKD 3, diverticulitis, SBO, and multiple abdominal surgeries listed below who presents with acute onset abdominal pain. She reports epigastric abdominal pain that is cramping in nature and started yesterday morning. The pain is non-radiating. She gave herself an enema and took oxycodone without relief of pain. Associated sxs include nausea and one episode of vomiting this morning. She reports one episode of flatus yesterday, none today. She tells me that she had one, hard, non-bloody stool yesterday before the enema. She denies fever, chills, melena, hematochezia, or urinary sxs. She denies the use of blood thinners. She tells me that at baseline she takes 15 mg of oxycodone every 4-6 hours for chronic pain. She is a former smoker.   Abdominal surgical history: hernia repair 1990's (in [place]), two surgeries for SBO in [place], and laparoscopic ventral hernia repair with mesh 05/05/22 Dr. Sophronia Simas.   ROS: Review of Systems  All other systems reviewed and are negative.   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   Bowel obstruction (HCC)    Depression with anxiety 02/09/2015   mild xanax prn   Diabetes mellitus (HCC) 07/18/2015   type 2   Diverticulitis    Dysrhythmia    abnormal ekgs worked  up   Essential tremor 03/31/2016   Fibromyalgia    MS   Gait difficulty 02/09/2015   Gallstones    GERD (gastroesophageal reflux disease)    HLD (hyperlipidemia)    Hypersomnia 11/09/2015   Hypertension    no meds   Hyperthyroidism    patient states has no problems at present-01/25/2019   Hypothyroidism    patient states no problems at present 01/25/2019   IBS (irritable bowel syndrome)    Insomnia 02/09/2015   Migraines    MS (multiple sclerosis) (HCC)    Multiple joint pain 03/31/2016   Numbness in both hands 03/18/2016   Other fatigue 02/09/2015   Peptic ulcer    Pneumonia    multiple times. Last time 2014   PONV (postoperative nausea and vomiting)    Urinary hesitancy 02/09/2015   Vision abnormalities     Past Surgical History:  Procedure Laterality Date   ABDOMINAL HYSTERECTOMY  1976   including oophorectomy, due to uterine cancer   APPENDECTOMY  1974   BIOPSY  01/31/2019   Procedure: BIOPSY;  Surgeon: Napoleon Form, MD;  Location: [facility];  Service: Endoscopy;;   CARDIAC CATHETERIZATION     2001/2006 In [place]. Normal per patient   CARPAL TUNNEL RELEASE Left    carpal tunnel surgery  CHOLECYSTECTOMY     2006   COLON SURGERY     SBO x 2   COLONOSCOPY WITH PROPOFOL N/A 01/31/2019   Procedure: COLONOSCOPY WITH PROPOFOL;  Surgeon: Napoleon Form, MD;  Location: [facility];  Service: Endoscopy;  Laterality: N/A;   COLONOSCOPY WITH PROPOFOL N/A 04/24/2022   Procedure: COLONOSCOPY WITH PROPOFOL;  Surgeon: Napoleon Form, MD;  Location: [facility];  Service: Gastroenterology;  Laterality: N/A;   EUS N/A 04/09/2017   Procedure: UPPER ENDOSCOPIC ULTRASOUND (EUS) LINEAR;  Surgeon: Rachael Fee, MD;  Location: [facility];  Service: Endoscopy;  Laterality: N/A;   HERNIA REPAIR     INCISIONAL HERNIA REPAIR N/A 05/05/2022   Procedure: LAPAROSCOPIC VENTRAL HERNA REPAIR WITH MESH;  Surgeon: Fritzi Mandes, MD;  Location: [facility] OR;  Service:  General;  Laterality: N/A;   INSERTION OF MESH N/A 05/05/2022   Procedure: INSERTION OF MESH;  Surgeon: Fritzi Mandes, MD;  Location: [facility] OR;  Service: General;  Laterality: N/A;   MENISCUS REPAIR Left    POLYPECTOMY  01/31/2019   Procedure: POLYPECTOMY;  Surgeon: Napoleon Form, MD;  Location: [facility];  Service: Endoscopy;;   POLYPECTOMY  04/24/2022   Procedure: POLYPECTOMY;  Surgeon: Napoleon Form, MD;  Location: [facility];  Service: Gastroenterology;;   SMALL INTESTINE SURGERY     toenail removal Right 12/2017   great toenail     Social History:  reports that she quit smoking about 34 years ago. Her smoking use included cigarettes. She started smoking about 39 years ago. She has a 1.3 pack-year smoking history. She has never been exposed to tobacco smoke. She has never used smokeless tobacco. She reports that she does not currently use alcohol. She reports current drug use. Drug: Oxycodone.  Allergies:  Allergies  Allergen Reactions   Ibuprofen Other (See Comments)    Developed a ulcer     Methylprednisolone Anaphylaxis, Shortness Of Breath and Palpitations   Naproxen Other (See Comments)    Developed a ulcer    Nsaids Other (See Comments)    Pt gets Ulcers    Prednisone Anaphylaxis, Shortness Of Breath and Palpitations   Bactrim [Sulfamethoxazole-Trimethoprim] Other (See Comments)    "blotchiness" redness, face swelling   Biaxin [Clarithromycin] Swelling   Sulfa Antibiotics Swelling and Other (See Comments)    Angioedema    Ozempic (0.25 Or 0.5 Mg-Dose) [Semaglutide(0.25 Or 0.5mg -Dos)] Rash    (Not in a hospital admission)    Physical Exam: Blood pressure 126/67, pulse 89, temperature 98.2 F (36.8 C), temperature source Oral, resp. rate 15, height 5\' 2"  (1.575 m), weight 70.3 kg, SpO2 92%. General: Pleasant black female, laying in hospital bed in NAD, somnolent but easily arouses to answer questions. HEENT: head -normocephalic, atraumatic; Eyes:  PERRLA, no conjunctival injection, anicteric sclerae Neck- Trachea is midline CV- RRR, normal S1/S2, no M/R/G, no lower extremity edema  Pulm- breathing is non-labored ORA Abd- soft, mild distention of the upper abdomen, mild tenderness in the upper abdomen without guarding, previous laparotomy incision appears well-healing, no palpable hernias or masses. GU- deferred  MSK- UE/LE symmetrical, no cyanosis, clubbing, or edema. Neuro- CN II-XII grossly in tact, no paresthesias. Psych- Alert and Oriented x3 with appropriate affect Skin: warm and dry, no rashes or lesions   Results for orders placed or performed during the hospital encounter of 11/13/22 (from the past 48 hour(s))  Lipase, blood     Status: None   Collection Time: 11/13/22  8:26 PM  Result  Value Ref Range   Lipase 21 11 - 51 U/L    Comment: Performed at [facility], [address], [place], [place] [address]  Comprehensive metabolic panel     Status: Abnormal   Collection Time: 11/13/22  8:26 PM  Result Value Ref Range   Sodium 142 135 - 145 mmol/L   Potassium 4.5 3.5 - 5.1 mmol/L   Chloride 97 (L) 98 - 111 mmol/L   CO2 26 22 - 32 mmol/L   Glucose, Bld 175 (H) 70 - 99 mg/dL    Comment: Glucose reference range applies only to samples taken after fasting for at least 8 hours.   BUN 14 8 - 23 mg/dL   Creatinine, Ser 6.04 (H) 0.44 - 1.00 mg/dL   Calcium 9.9 8.9 - 54.0 mg/dL   Total Protein 8.0 6.5 - 8.1 g/dL   Albumin 4.5 3.5 - 5.0 g/dL   AST 23 15 - 41 U/L   ALT 16 0 - 44 U/L   Alkaline Phosphatase 64 38 - 126 U/L   Total Bilirubin 1.1 0.3 - 1.2 mg/dL   GFR, Estimated 52 (L) >60 mL/min    Comment: (NOTE) Calculated using the CKD-EPI Creatinine Equation (2021)    Anion gap 19 (H) 5 - 15    Comment: Performed at [facility], [address], [place], [place] [address]  CBC     Status: Abnormal   Collection Time: 11/13/22  8:26 PM  Result Value Ref Range   WBC 7.9 4.0 - 10.5 K/uL   RBC 5.13 (H) 3.87 -  5.11 MIL/uL   Hemoglobin 13.9 12.0 - 15.0 g/dL   HCT 14.7 82.9 - 56.2 %   MCV 84.6 80.0 - 100.0 fL   MCH 27.1 26.0 - 34.0 pg   MCHC 32.0 30.0 - 36.0 g/dL   RDW 13.0 86.5 - 78.4 %   Platelets 231 150 - 400 K/uL   nRBC 0.0 0.0 - 0.2 %    Comment: Performed at [facility], [address], [place], [place] [address]  Urinalysis, Routine w reflex microscopic -Urine, Clean Catch     Status: Abnormal   Collection Time: 11/13/22  8:49 PM  Result Value Ref Range   Color, Urine YELLOW YELLOW   APPearance CLEAR CLEAR   Specific Gravity, Urine 1.029 1.005 - 1.030   pH 5.0 5.0 - 8.0   Glucose, UA >=500 (A) NEGATIVE mg/dL   Hgb urine dipstick NEGATIVE NEGATIVE   Bilirubin Urine NEGATIVE NEGATIVE   Ketones, ur 20 (A) NEGATIVE mg/dL   Protein, ur NEGATIVE NEGATIVE mg/dL   Nitrite NEGATIVE NEGATIVE   Leukocytes,Ua NEGATIVE NEGATIVE   RBC / HPF 0-5 0 - 5 RBC/hpf   WBC, UA 6-10 0 - 5 WBC/hpf   Bacteria, UA MANY (A) NONE SEEN   Squamous Epithelial / HPF 0-5 0 - 5 /HPF    Comment: Performed at [facility], [address] [place], [place] [address]  CBG monitoring, ED     Status: Abnormal   Collection Time: 11/14/22  2:09 AM  Result Value Ref Range   Glucose-Capillary 181 (H) 70 - 99 mg/dL    Comment: Glucose reference range applies only to samples taken after fasting for at least 8 hours.  Troponin I (High Sensitivity)     Status: None   Collection Time: 11/14/22  2:34 AM  Result Value Ref Range   Troponin I (High Sensitivity) 7 <18 ng/L  Comment: (NOTE) Elevated high sensitivity troponin I (hsTnI) values and significant  changes across serial measurements may suggest ACS but many other  chronic and acute conditions are known to elevate hsTnI results.  Refer to the "Links" section for chest pain algorithms and additional  guidance. Performed at [facility], [address], [place], [place] [address]    CT ABDOMEN PELVIS W CONTRAST  Result Date: 11/14/2022 CLINICAL  DATA:  Acute, nonlocalized a no pain EXAM: CT ABDOMEN AND PELVIS WITH CONTRAST TECHNIQUE: Multidetector CT imaging of the abdomen and pelvis was performed using the standard protocol following bolus administration of intravenous contrast. RADIATION DOSE REDUCTION: This exam was performed according to the departmental dose-optimization program which includes automated exposure control, adjustment of the mA and/or kV according to patient size and/or use of iterative reconstruction technique. CONTRAST:  75mL OMNIPAQUE IOHEXOL 350 MG/ML SOLN COMPARISON:  08/14/2021 FINDINGS: Lower chest: Mild linear atelectasis at the lung bases with some scarring. Hepatobiliary: No focal liver abnormality.Biliary dilatation post cholecystectomy, possibly reservoir effect given stability, CBD measuring up to 14 mm. No calcified choledocholithiasis. Chart history of EUS. Pancreas: Generalized atrophy Spleen: Unremarkable. Adrenals/Urinary Tract: Negative adrenals. No hydronephrosis or stone. Unremarkable bladder. Stomach/Bowel: Dilated and fluid-filled small bowel with fecalized material before a abrupt transition in the left paramedian abdomen where there is angulated bowel, the segment measures up to 3.6 cm in diameter and is associated with mild mesenteric stranding. 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 Distal colonic diverticulosis. Vascular/Lymphatic: No acute vascular abnormality. No mass or adenopathy. Reproductive:Hysterectomy Other: No ascites or pneumoperitoneum. Musculoskeletal: No acute abnormalities. IMPRESSION: 1. Small bowel obstruction in the left mid abdomen where there is angulation of bowel loops. 2. Chronic biliary dilatation post cholecystectomy. Electronically Signed   By: Tiburcio Pea M.D.   On: 11/14/2022 04:17   DG Abdomen 1 View  Result Date: 11/13/2022 CLINICAL DATA:  Abdomen pain EXAM: ABDOMEN - 1 VIEW COMPARISON:  CT 08/14/2021 FINDINGS:  Postsurgical changes in the right mid quadrant. Nonobstructed gas pattern with mild stool burden. No radiopaque calculi IMPRESSION: Nonobstructed gas pattern. Electronically Signed   By: Jasmine Pang M.D.   On: 11/13/2022 21:54      Assessment/Plan SBO, likely due to adhesions in the setting of multiple abdominal surgeries.  - Afebrile, VSS, WBC 7.9 - no peritonitis or signs of intestinal ischemia, no role for emergent surgery - patient refused NGT in the ED. Attempt PO SBO protocol with gastrografin. I recommend NG placement if she vomits again and she is agreeable to this.   FEN - NPO, IVF, would avoid PO meds at this time VTE - SCD's, lovenox ID - none indicated Admit - [facility] service    I reviewed nursing notes, hospitalist notes, last 24 h vitals and pain scores, last 48 h intake and output, last 24 h labs and trends, and last 24 h imaging results.  Adam Phenix, [facility] 11/14/2022, 8:05 AM Please see Amion for pager number during day hours 7:00am-4:30pm or 7:00am -11:30am on weekends

## 2022-11-14 NOTE — Progress Notes (Signed)
PROGRESS NOTE    Courtney [name]  FAO:[id number] DOB: 1956-02-02 DOA: 11/13/2022 PCP: Rema Fendt, NP   Brief Narrative:  67 year old with history of HTN, DM 2, CKD stage IIIa, diverticulitis, SBO with multiple abdominal surgeries in the past admitted for abdominal pain.  She has had prior abdominal surgeries including hernia repair in 90s and [place] bowel [place], last 1 was ventral hernia repair with mesh in March 2024.  Upon admission she was diagnosed with small bowel obstruction secondary to adhesion.  General surgery was consulted.  Gastrografin/SBO protocol was initiated.  Initially refused NG tube placement due to her prior history of nasal surgeries.   Assessment & Plan:  Principal Problem:   SBO (small bowel obstruction) (HCC) Active Problems:   H/O umbilical hernia repair with mesh placement 05/2022   Essential hypertension   CKD stage 3a, GFR 45-59 ml/min (HCC)   Insulin dependent type 2 diabetes mellitus (HCC)   Depression   AKI (acute kidney injury) (HCC)   Small bowel obstruction History of small bowel obstruction x 2 History of umbilical hernia repair with mesh placement in March 2024 Suspect this is secondary to adhesions.  SBO protocol with Gastrografin.  Currently patient is hesitant to get NG tube.  Continue IV fluids, close monitoring.  General surgery is following.   CKD stage 2 Patient does not have AKI, creatinine is at baseline of 1.1.  Closely continue to monitor.  She is getting IV fluids.   History of essential hypertension Currently no meds on hold.  IV as needed   Insulin-dependent DM type II -A1c 6.7 in April 2024.  Holding home medications.  Sliding scale and Accu-Cheks   Generalized anxiety disorder Chronic depression -Continue Xanax 0.5 mg 3 times daily as needed  Left Arm Cast -[facility] notified to take a look at this.  Discussed with Dr. Frazier Butt     DVT prophylaxis: Subcu heparin Code Status:  Full code Family Communication:   Status is: Inpatient Remains inpatient appropriate because: Continue hospital stay until improvement in her small bowel obstruction    Subjective: Passing gas this morning.  Denies any nausea or vomiting.   Examination:  General exam: Appears calm and comfortable  Respiratory system: Clear to auscultation. Respiratory effort normal. Cardiovascular system: S1 & S2 heard, RRR. No JVD, murmurs, rubs, gallops or clicks. No pedal edema. Gastrointestinal system: Abdomen is nondistended, soft and nontender. No organomegaly or masses felt. Normal bowel sounds heard. Central nervous system: Alert and oriented. No focal neurological deficits. Extremities: Symmetric 5 x 5 power. Skin: No rashes, lesions or ulcers Psychiatry: Judgement and insight appear normal. Mood & affect appropriate.      Diet Orders (From admission, onward)     Start     Ordered   11/14/22 0558  Diet NPO time specified Except for: Ice Chips, Sips with Meds  Diet effective now       Question Answer Comment  Except for Ice Chips   Except for Sips with Meds      11/14/22 0557            Objective: Vitals:   11/14/22 0300 11/14/22 0315 11/14/22 0500 11/14/22 0823  BP: 131/72  126/67 134/72  Pulse: 85  89 85  Resp: 16  15 16   Temp:    98.2 F (36.8 C)  TempSrc:    Oral  SpO2:  94% 92% 92%  Weight:      Height:  Intake/Output Summary (Last 24 hours) at 11/14/2022 0907 Last data filed at 11/14/2022 0330 Gross per 24 hour  Intake 1000 ml  Output --  Net 1000 ml   Filed Weights   11/13/22 2015  Weight: 70.3 kg    Scheduled Meds:  diatrizoate meglumine-sodium  90 mL Oral Once   heparin  5,000 Units Subcutaneous Q8H   sodium chloride flush  3 mL Intravenous Q12H   sodium chloride flush  3 mL Intravenous Q12H   Continuous Infusions:  sodium chloride     lactated ringers 125 mL/hr at 11/14/22 0657    Nutritional status     Body mass index is 28.35  kg/m.  Data Reviewed:   CBC: Recent Labs  Lab 11/13/22 2026  WBC 7.9  HGB 13.9  HCT 43.4  MCV 84.6  PLT 231   Basic Metabolic Panel: Recent Labs  Lab 11/13/22 2026  NA 142  K 4.5  CL 97*  CO2 26  GLUCOSE 175*  BUN 14  CREATININE 1.16*  CALCIUM 9.9   GFR: Estimated Creatinine Clearance: 43.2 mL/min (A) (by C-G formula based on SCr of 1.16 mg/dL (H)). Liver Function Tests: Recent Labs  Lab 11/13/22 2026  AST 23  ALT 16  ALKPHOS 64  BILITOT 1.1  PROT 8.0  ALBUMIN 4.5   Recent Labs  Lab 11/13/22 2026  LIPASE 21   No results for input(s): "AMMONIA" in the last 168 hours. Coagulation Profile: No results for input(s): "INR", "PROTIME" in the last 168 hours. Cardiac Enzymes: No results for input(s): "CKTOTAL", "CKMB", "CKMBINDEX", "TROPONINI" in the last 168 hours. BNP (last 3 results) No results for input(s): "PROBNP" in the last 8760 hours. HbA1C: No results for input(s): "HGBA1C" in the last 72 hours. CBG: Recent Labs  Lab 11/14/22 0209  GLUCAP 181*   Lipid Profile: No results for input(s): "CHOL", "HDL", "LDLCALC", "TRIG", "CHOLHDL", "LDLDIRECT" in the last 72 hours. Thyroid Function Tests: No results for input(s): "TSH", "T4TOTAL", "FREET4", "T3FREE", "THYROIDAB" in the last 72 hours. Anemia Panel: No results for input(s): "VITAMINB12", "FOLATE", "FERRITIN", "TIBC", "IRON", "RETICCTPCT" in the last 72 hours. Sepsis Labs: No results for input(s): "PROCALCITON", "LATICACIDVEN" in the last 168 hours.  No results found for this or any previous visit (from the past 240 hour(s)).       Radiology Studies: CT ABDOMEN PELVIS W CONTRAST  Result Date: 11/14/2022 CLINICAL DATA:  Acute, nonlocalized a no pain EXAM: CT ABDOMEN AND PELVIS WITH CONTRAST TECHNIQUE: Multidetector CT imaging of the abdomen and pelvis was performed using the standard protocol following bolus administration of intravenous contrast. RADIATION DOSE REDUCTION: This exam was  performed according to the departmental dose-optimization program which includes automated exposure control, adjustment of the mA and/or kV according to patient size and/or use of iterative reconstruction technique. CONTRAST:  75mL OMNIPAQUE IOHEXOL 350 MG/ML SOLN COMPARISON:  08/14/2021 FINDINGS: Lower chest: Mild linear atelectasis at the lung bases with some scarring. Hepatobiliary: No focal liver abnormality.Biliary dilatation post cholecystectomy, possibly reservoir effect given stability, CBD measuring up to 14 mm. No calcified choledocholithiasis. Chart history of EUS. Pancreas: Generalized atrophy Spleen: Unremarkable. Adrenals/Urinary Tract: Negative adrenals. No hydronephrosis or stone. Unremarkable bladder. Stomach/Bowel: Dilated and fluid-filled small bowel with fecalized material before a abrupt transition in the left paramedian abdomen where there is angulated bowel, the segment measures up to 3.6 cm in diameter and is associated with mild mesenteric stranding. 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 Distal colonic diverticulosis.  Vascular/Lymphatic: No acute vascular abnormality. No mass or adenopathy. Reproductive:Hysterectomy Other: No ascites or pneumoperitoneum. Musculoskeletal: No acute abnormalities. IMPRESSION: 1. Small bowel obstruction in the left mid abdomen where there is angulation of bowel loops. 2. Chronic biliary dilatation post cholecystectomy. Electronically Signed   By: Tiburcio Pea M.D.   On: 11/14/2022 04:17   DG Abdomen 1 View  Result Date: 11/13/2022 CLINICAL DATA:  Abdomen pain EXAM: ABDOMEN - 1 VIEW COMPARISON:  CT 08/14/2021 FINDINGS: Postsurgical changes in the right mid quadrant. Nonobstructed gas pattern with mild stool burden. No radiopaque calculi IMPRESSION: Nonobstructed gas pattern. Electronically Signed   By: Jasmine Pang M.D.   On: 11/13/2022 21:54           LOS: 0 days   Time spent= 35  mins    Miguel Rota, MD [facility]  If 7PM-7AM, please contact night-coverage  11/14/2022, 9:07 AM

## 2022-11-14 NOTE — ED Provider Notes (Signed)
Emergency Department Provider Note  I have reviewed the triage vital signs and the nursing notes.  HISTORY  Chief Complaint Abdominal Pain   HPI Courtney [name] is a 67 y.o. female with a history of gastric ulcers, diabetes, small bowel obstruction status post repair, hernia status post repair in March presents ER today with abdominal pain.  Patient states seems to be worse in the upper middle area.  She states that she had a couple episodes of nonbloody nonbilious emesis.  No fevers.  Started earlier today.  Was initially having bowel movements but has only passed gas once in quite a while.  Feels like she might be a bit bloated.  Had breakfast but has not had any since then.  Has not tried anything for symptoms at home.  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   Bowel obstruction (HCC)    Depression with anxiety 02/09/2015   mild xanax prn   Diabetes mellitus (HCC) 07/18/2015   type 2   Diverticulitis    Dysrhythmia    abnormal ekgs worked up   Essential tremor 03/31/2016   Fibromyalgia    MS   Gait difficulty 02/09/2015   Gallstones    GERD (gastroesophageal reflux disease)    HLD (hyperlipidemia)    Hypersomnia 11/09/2015   Hypertension    no meds   Hyperthyroidism    patient states has no problems at present-01/25/2019   Hypothyroidism    patient states no problems at present 01/25/2019   IBS (irritable bowel syndrome)    Insomnia 02/09/2015   Migraines    MS (multiple sclerosis) (HCC)    Multiple joint pain 03/31/2016   Numbness in both hands 03/18/2016   Other fatigue 02/09/2015   Peptic ulcer    Pneumonia    multiple times. Last time 2014   PONV (postoperative nausea and vomiting)    Urinary hesitancy 02/09/2015   Vision abnormalities     Home Medications Prior to Admission medications    Medication Sig Start Date End Date Taking? Authorizing Provider  acetaminophen (TYLENOL) 500 MG tablet Take 1,000 mg by mouth every 8 (eight) hours as needed for headache.    [provider]  ALPRAZolam Prudy Feeler) 1 MG tablet Take 0.5-1 mg by mouth 3 (three) times daily as needed for anxiety.    [provider]  Blood Glucose Monitoring Suppl (FREESTYLE LITE) w/Device KIT 1 Device by Does not apply route daily in the afternoon. 06/19/22   Shamleffer, Konrad Dolores, MD  Continuous Blood Gluc Sensor (DEXCOM G7 SENSOR) MISC 1 Device by Does not apply route as directed. 06/06/22   Shamleffer, Konrad Dolores, MD  empagliflozin (JARDIANCE) 25 MG TABS tablet Take 1 tablet (25 mg total) by mouth daily. 06/06/22   Shamleffer, Konrad Dolores, MD  Estradiol 10 MCG TABS vaginal tablet Place 1 tablet (10 mcg total) vaginally at bedtime for 14 days, THEN 1 tablet (10 mcg total) 2 (two) times a week. 03/18/22 04/01/23  Lennart Pall, MD  glucose blood (FREESTYLE LITE) test strip 1 each by Other route in the morning, at noon, in the evening, and at bedtime. Use as instructed 06/20/22   Shamleffer, Konrad Dolores, MD  hydrocortisone cream 0.5 % Apply 1 Application topically 2 (two) times daily. Patient not taking: Reported on 10/08/2022 06/04/22  Shamleffer, Konrad Dolores, MD  insulin aspart (NOVOLOG FLEXPEN) 100 UNIT/ML FlexPen Max daily 30 units 06/20/22   Shamleffer, Konrad Dolores, MD  insulin glargine (LANTUS SOLOSTAR) 100 UNIT/ML Solostar Pen Inject 12 Units into the skin daily. 06/20/22   Shamleffer, Konrad Dolores, MD  Insulin Pen Needle 32G X 4 MM MISC 1 Device by Does not apply route in the morning, at noon, in the evening, and at bedtime. 06/19/22   Shamleffer, Konrad Dolores, MD  ketoconazole (NIZORAL) 2 % cream Apply 1 Application topically daily. 07/30/22   Rema Fendt, NP  Lancets (FREESTYLE) lancets 1 Device by Misc.(Non-Drug; Combo Route) route daily. USED TO CHECK BLOOD SUGAR TID  06/04/22   Shamleffer, Konrad Dolores, MD  latanoprost (XALATAN) 0.005 % ophthalmic solution Place 1 drop into both eyes at bedtime. 02/06/22   [provider]  loratadine (CLARITIN) 10 MG tablet Take 10 mg by mouth daily as needed for allergies (seasonal). 01/05/20   [provider]  modafinil (PROVIGIL) 100 MG tablet Take 25 mg by mouth daily as needed (Takes to help stay awake due to insomnia, takes at 6a if needed). 02/19/22   [provider]  Multiple Vitamins-Minerals (MULTIVITAMIN ADULT) CHEW Chew 2 each by mouth 2 (two) times a week. Patient not taking: Reported on 10/08/2022    [provider]  mupirocin ointment (BACTROBAN) 2 % Apply 1 Application topically 2 (two) times daily. 07/30/22   Rema Fendt, NP  naloxone [facility]) nasal spray 4 mg/0.1 mL Place 1 spray into the nose daily as needed (overdose).    [provider]  ondansetron (ZOFRAN) 4 MG tablet Take 1 tablet (4 mg total) by mouth every 6 (six) hours as needed for nausea or vomiting. Prior to colonoscopy prep 05/05/22   Fritzi Mandes, MD  oxyCODONE (ROXICODONE) 15 MG immediate release tablet Take 15 mg by mouth 5 (five) times daily as needed for pain. 08/02/21   [provider]  pantoprazole (PROTONIX) 40 MG tablet Take 1 tablet (40 mg total) by mouth daily. 01/29/22   Esterwood, Amy S, PA-C  terconazole (TERAZOL 3) 0.8 % vaginal cream Place 1 applicator vaginally at bedtime. 09/26/22   Rema Fendt, NP  sucralfate (CARAFATE) 1 g tablet Take 1 tablet (1 g total) by mouth 4 (four) times daily -  before meals and at bedtime. Dissolve 1 tablet in 2-4 oz of water, drink as slurry 08/10/19 02/29/20  Napoleon Form, MD    Social History Social History   Tobacco Use   Smoking status: Former    Current packs/day: 0.00    Average packs/day: 0.3 packs/day for 5.0 years (1.3 ttl pk-yrs)    Types: Cigarettes    Start date: 66    Quit date: 20    Years since quitting: 34.7     Passive exposure: Never   Smokeless tobacco: Never  Vaping Use   Vaping status: Never Used  Substance Use Topics   Alcohol use: Not Currently    Comment: rarely   Drug use: Yes    Types: Oxycodone    Review of Systems: Documented in HPI ____________________________________________  PHYSICAL EXAM: VITAL SIGNS: ED Triage Vitals  Encounter Vitals Group     BP 11/13/22 1946 (!) 158/83     Systolic BP Percentile --      Diastolic BP Percentile --      Pulse Rate 11/13/22 1946 82     Resp 11/13/22 1946 17     Temp 11/13/22 1946  98.4 F (36.9 C)     Temp Source 11/13/22 1946 Oral     SpO2 11/13/22 1946 97 %     Weight 11/13/22 2015 155 lb (70.3 kg)     Height 11/13/22 2015 5\' 2"  (1.575 m)     Head Circumference --      Peak Flow --      Pain Score 11/13/22 2015 10     Pain Loc --      Pain Education --      Exclude from Growth Chart --    Physical Exam Vitals and nursing note reviewed.  Constitutional:      Appearance: She is well-developed.  HENT:     Head: Normocephalic and atraumatic.  Cardiovascular:     Rate and Rhythm: Normal rate and regular rhythm.  Pulmonary:     Effort: No respiratory distress.     Breath sounds: No stridor.  Abdominal:     General: There is no distension.     Tenderness: There is abdominal tenderness in the epigastric area.  Musculoskeletal:     Cervical back: Normal range of motion.  Neurological:     Mental Status: She is alert.       ____________________________________________   LABS (all labs ordered are listed, but only abnormal results are displayed)  Labs Reviewed  COMPREHENSIVE METABOLIC PANEL - Abnormal; Notable for the following components:      Result Value   Chloride 97 (*)    Glucose, Bld 175 (*)    Creatinine, Ser 1.16 (*)    GFR, Estimated 52 (*)    Anion gap 19 (*)    All other components within normal limits  CBC - Abnormal; Notable for the following components:   RBC 5.13 (*)    All other components  within normal limits  URINALYSIS, ROUTINE W REFLEX MICROSCOPIC - Abnormal; Notable for the following components:   Glucose, UA >=500 (*)    Ketones, ur 20 (*)    Bacteria, UA MANY (*)    All other components within normal limits  CBG MONITORING, ED - Abnormal; Notable for the following components:   Glucose-Capillary 181 (*)    All other components within normal limits  URINE CULTURE  LIPASE, BLOOD  HIV ANTIBODY (ROUTINE TESTING W REFLEX)  CREATININE, URINE, RANDOM  SODIUM, URINE, RANDOM  TROPONIN I (HIGH SENSITIVITY)   ____________________________________________  EKG   EKG Interpretation Date/Time:  Friday November 14 2022 02:19:43 EDT Ventricular Rate:  85 PR Interval:  111 QRS Duration:  81 QT Interval:  410 QTC Calculation: 488 R Axis:   68  Text Interpretation: Sinus rhythm Borderline short PR interval Probable left atrial enlargement similar/maybe slightly improved compared to february Confirmed by Marily Memos [phone]) on 11/14/2022 2:24:26 AM        ____________________________________________  RADIOLOGY  CT ABDOMEN PELVIS W CONTRAST  Result Date: 11/14/2022 CLINICAL DATA:  Acute, nonlocalized a no pain EXAM: CT ABDOMEN AND PELVIS WITH CONTRAST TECHNIQUE: Multidetector CT imaging of the abdomen and pelvis was performed using the standard protocol following bolus administration of intravenous contrast. RADIATION DOSE REDUCTION: This exam was performed according to the departmental dose-optimization program which includes automated exposure control, adjustment of the mA and/or kV according to patient size and/or use of iterative reconstruction technique. CONTRAST:  75mL OMNIPAQUE IOHEXOL 350 MG/ML SOLN COMPARISON:  08/14/2021 FINDINGS: Lower chest: Mild linear atelectasis at the lung bases with some scarring. Hepatobiliary: No focal liver abnormality.Biliary dilatation post cholecystectomy, possibly reservoir effect given  stability, CBD measuring up to 14 mm. No  calcified choledocholithiasis. Chart history of EUS. Pancreas: Generalized atrophy Spleen: Unremarkable. Adrenals/Urinary Tract: Negative adrenals. No hydronephrosis or stone. Unremarkable bladder. Stomach/Bowel: Dilated and fluid-filled small bowel with fecalized material before a abrupt transition in the left paramedian abdomen where there is angulated bowel, the segment measures up to 3.6 cm in diameter and is associated with mild mesenteric stranding. 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 Distal colonic diverticulosis. Vascular/Lymphatic: No acute vascular abnormality. No mass or adenopathy. Reproductive:Hysterectomy Other: No ascites or pneumoperitoneum. Musculoskeletal: No acute abnormalities. IMPRESSION: 1. Small bowel obstruction in the left mid abdomen where there is angulation of bowel loops. 2. Chronic biliary dilatation post cholecystectomy. Electronically Signed   By: Tiburcio Pea M.D.   On: 11/14/2022 04:17   DG Abdomen 1 View  Result Date: 11/13/2022 CLINICAL DATA:  Abdomen pain EXAM: ABDOMEN - 1 VIEW COMPARISON:  CT 08/14/2021 FINDINGS: Postsurgical changes in the right mid quadrant. Nonobstructed gas pattern with mild stool burden. No radiopaque calculi IMPRESSION: Nonobstructed gas pattern. Electronically Signed   By: Jasmine Pang M.D.   On: 11/13/2022 21:54   ____________________________________________  PROCEDURES  Procedure(s) performed:   .Critical Care  Performed by: Marily Memos, MD Authorized by: Marily Memos, MD   Critical care provider statement:    Critical care time (minutes):  30   Critical care was necessary to treat or prevent imminent or life-threatening deterioration of the following conditions:  Metabolic crisis   Critical care was time spent personally by me on the following activities:  Development of treatment plan with patient or surrogate, discussions with consultants, evaluation of  patient's response to treatment, examination of patient, ordering and review of laboratory studies, ordering and review of radiographic studies, ordering and performing treatments and interventions, pulse oximetry, re-evaluation of patient's condition and review of old charts  ____________________________________________  INITIAL IMPRESSION / ASSESSMENT AND PLAN   This patient presents to the ED for concern of abdominal pain, this involves an extensive number of treatment options, and is a complaint that carries with it a high risk of complications and morbidity.  The differential diagnosis includes PUD, gastritis, SBO, colitis, enteritis. Will obtain CT and treat symptomatically at this time. NPO.   Additional history obtained:  Additional history obtained from husband at bedside Previous records obtained and reviewed in Epic  Co morbidities that complicate the patient evaluation  Diabetes, previous surgeries.   Social Determinants of Health:  N/a  ED Course  Images ordered viewed and obtained by myself. Agree with Radiology interpretation. Details in ED course.  Labs ordered reviewed by myself as detailed in ED course.  Consultations obtained/considered detailed in ED course.   Clinical Course as of 11/14/22 0625  Fri Nov 14, 2022  0122 Creatinine(!): 1.16 [JM]  0122 WBC: 7.9 [JM]  0123 Bacteria, UA(!): MANY No urinary symptoms, will culture and hold on antibiotics [JM]  0123 DG Abdomen 1 View No obvious obstruction or free air on my interpretation [JM]  0123 Temp: 98.8 F (37.1 C) reassuring [JM]  0623 CT scan viewed by myself with air fluid levels c/w SBO, rad read concurs. Has needed surgery previously, will d/w Surgery so they are on board but likely admit to medicine. Will order infusion, pain meds and NGT. [JM]    Clinical Course User Index [JM] Erikka Follmer, Barbara Cower, MD      Cardiac Monitoring:  The patient was maintained on a cardiac monitor.  I personally viewed and  interpreted the cardiac monitored which showed an underlying rhythm of: sinus rhythm  CRITICAL INTERVENTIONS:  NG tube Fluids Pain meds Surgical consultation  Reevaluation:  After the interventions noted above, I reevaluated the patient and found that they have :improved  FINAL IMPRESSION AND PLAN Final diagnoses:  SBO (small bowel obstruction) [facility])     Medical screening exam was performed and I feel the patient has had appropriate emergency department evaluation and work-up for their chief complaint and is stable for ADMISSION to the hospitalist time.  I discussed with Dr. Janee Morn with the general service and discussed labs, imaging and other work-up in the emergency room.  They agree to admission for further management and work-up of said condition but request [facility] admit, D/w Dr. Janalyn Shy for same. Patient refusing NGT at this time. ____________________________________________   NEW OUTPATIENT MEDICATIONS STARTED DURING THIS VISIT:  New Prescriptions   No medications on file    Note:  This note was prepared with assistance of Dragon voice recognition software. Occasional wrong-word or sound-a-like substitutions may have occurred due to the inherent limitations of voice recognition software.    Anamaria Dusenbury, Barbara Cower, MD 11/14/22 [phone]

## 2022-11-14 NOTE — Plan of Care (Signed)

## 2022-11-14 NOTE — ED Notes (Signed)
ED TO INPATIENT HANDOFF REPORT  ED Nurse Name and Phone #:  Lenisha Lacap 42  S Name/Age/Gender Courtney [name] 67 y.o. female Room/Bed: 045C/045C  Code Status   Code Status: Full Code  Home/SNF/Other Home Patient oriented to: situation Is this baseline? Yes   Triage Complete: Triage complete  Chief Complaint SBO (small bowel obstruction) (HCC) [K56.609]  Triage Note Pt arrives to ED umbilical region abdominal pain x 1 day. Pt reports no vomiting or nausea but states that she is constipated. Pt with hx of stomach ulcer, hernia repair in March and previous hx of small bowel obstruction.   Allergies Allergies  Allergen Reactions   Ibuprofen Other (See Comments)    Developed a ulcer     Methylprednisolone Anaphylaxis, Shortness Of Breath and Palpitations   Naproxen Other (See Comments)    Developed a ulcer    Nsaids Other (See Comments)    Pt gets Ulcers    Prednisone Anaphylaxis, Shortness Of Breath and Palpitations   Bactrim [Sulfamethoxazole-Trimethoprim] Other (See Comments)    "blotchiness" redness, face swelling   Biaxin [Clarithromycin] Swelling   Sulfa Antibiotics Swelling and Other (See Comments)    Angioedema    Ozempic (0.25 Or 0.5 Mg-Dose) [Semaglutide(0.25 Or 0.5mg -Dos)] Rash    Level of Care/Admitting Diagnosis ED Disposition     ED Disposition  Admit   Condition  --   Comment  Hospital Area: [facility] [[id number]]  Level of Care: Med-Surg [16]  May admit patient to [facility] or [facility] if equivalent level of care is available:: No  Covid Evaluation: Asymptomatic - no recent exposure (last 10 days) testing not required  Diagnosis: SBO (small bowel obstruction) [facility]) [[id number]]  Admitting Physician: Tereasa Coop [[id number]]  Attending Physician: Tereasa Coop [[id number]]  Certification:: I certify this patient will need inpatient services for at least 2 midnights  Expected Medical Readiness: 11/19/2022           B Medical/Surgery History 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   Bowel obstruction (HCC)    Depression with anxiety 02/09/2015   mild xanax prn   Diabetes mellitus (HCC) 07/18/2015   type 2   Diverticulitis    Dysrhythmia    abnormal ekgs worked up   Essential tremor 03/31/2016   Fibromyalgia    MS   Gait difficulty 02/09/2015   Gallstones    GERD (gastroesophageal reflux disease)    HLD (hyperlipidemia)    Hypersomnia 11/09/2015   Hypertension    no meds   Hyperthyroidism    patient states has no problems at present-01/25/2019   Hypothyroidism    patient states no problems at present 01/25/2019   IBS (irritable bowel syndrome)    Insomnia 02/09/2015   Migraines    MS (multiple sclerosis) (HCC)    Multiple joint pain 03/31/2016   Numbness in both hands 03/18/2016   Other fatigue 02/09/2015   Peptic ulcer    Pneumonia    multiple times. Last time 2014   PONV (postoperative nausea and vomiting)    Urinary hesitancy 02/09/2015   Vision abnormalities    Past Surgical History:  Procedure Laterality Date   ABDOMINAL HYSTERECTOMY  1976   including oophorectomy, due to uterine cancer   APPENDECTOMY  1974   BIOPSY  01/31/2019   Procedure: BIOPSY;  Surgeon:  Napoleon Form, MD;  Location: [facility];  Service: Endoscopy;;   CARDIAC CATHETERIZATION     2001/2006 [place]. Normal per patient   CARPAL TUNNEL RELEASE Left    carpal tunnel surgery   CHOLECYSTECTOMY     2006   COLON SURGERY     SBO x 2   COLONOSCOPY WITH PROPOFOL N/A 01/31/2019   Procedure: COLONOSCOPY WITH PROPOFOL;  Surgeon: Napoleon Form, MD;  Location: [facility];  Service: Endoscopy;  Laterality: N/A;   COLONOSCOPY WITH PROPOFOL N/A 04/24/2022   Procedure: COLONOSCOPY WITH PROPOFOL;  Surgeon: Napoleon Form, MD;  Location:  [facility];  Service: Gastroenterology;  Laterality: N/A;   EUS N/A 04/09/2017   Procedure: UPPER ENDOSCOPIC ULTRASOUND (EUS) LINEAR;  Surgeon: Rachael Fee, MD;  Location: [facility];  Service: Endoscopy;  Laterality: N/A;   HERNIA REPAIR     INCISIONAL HERNIA REPAIR N/A 05/05/2022   Procedure: LAPAROSCOPIC VENTRAL HERNA REPAIR WITH MESH;  Surgeon: Fritzi Mandes, MD;  Location: [facility] OR;  Service: General;  Laterality: N/A;   INSERTION OF MESH N/A 05/05/2022   Procedure: INSERTION OF MESH;  Surgeon: Fritzi Mandes, MD;  Location: [facility] OR;  Service: General;  Laterality: N/A;   MENISCUS REPAIR Left    POLYPECTOMY  01/31/2019   Procedure: POLYPECTOMY;  Surgeon: Napoleon Form, MD;  Location: [facility];  Service: Endoscopy;;   POLYPECTOMY  04/24/2022   Procedure: POLYPECTOMY;  Surgeon: Napoleon Form, MD;  Location: [facility];  Service: Gastroenterology;;   SMALL INTESTINE SURGERY     toenail removal Right 12/2017   great toenail      A IV Location/Drains/Wounds Patient Lines/Drains/Airways Status     Active Line/Drains/Airways     Name Placement date Placement time Site Days   Peripheral IV 11/14/22 20 G Anterior;Right;Upper Arm 11/14/22  0150  Arm  less than 1   Incision - 3 Ports Abdomen 1: Left;Lower 2: Left;Upper 3: Right 05/05/22  0758  -- 193            Intake/Output Last 24 hours  Intake/Output Summary (Last 24 hours) at 11/14/2022 0932 Last data filed at 11/14/2022 0330 Gross per 24 hour  Intake 1000 ml  Output --  Net 1000 ml    Labs/Imaging Results for orders placed or performed during the hospital encounter of 11/13/22 (from the past 48 hour(s))  Lipase, blood     Status: None   Collection Time: 11/13/22  8:26 PM  Result Value Ref Range   Lipase 21 11 - 51 U/L    Comment: Performed at [facility], [address] [place], [place] [address]  Comprehensive metabolic panel     Status: Abnormal   Collection Time: 11/13/22  8:26 PM   Result Value Ref Range   Sodium 142 135 - 145 mmol/L   Potassium 4.5 3.5 - 5.1 mmol/L   Chloride 97 (L) 98 - 111 mmol/L   CO2 26 22 - 32 mmol/L   Glucose, Bld 175 (H) 70 - 99 mg/dL    Comment: Glucose reference range applies only to samples taken after fasting for at least 8 hours.   BUN 14 8 - 23 mg/dL   Creatinine, Ser 6.04 (H) 0.44 - 1.00 mg/dL   Calcium 9.9 8.9 - 54.0 mg/dL   Total Protein 8.0 6.5 - 8.1 g/dL   Albumin 4.5 3.5 - 5.0 g/dL   AST 23 15 - 41 U/L   ALT 16 0 -  44 U/L   Alkaline Phosphatase 64 38 - 126 U/L   Total Bilirubin 1.1 0.3 - 1.2 mg/dL   GFR, Estimated 52 (L) >60 mL/min    Comment: (NOTE) Calculated using the CKD-EPI Creatinine Equation (2021)    Anion gap 19 (H) 5 - 15    Comment: Performed at [facility], [address], [place], [place] [address]  CBC     Status: Abnormal   Collection Time: 11/13/22  8:26 PM  Result Value Ref Range   WBC 7.9 4.0 - 10.5 K/uL   RBC 5.13 (H) 3.87 - 5.11 MIL/uL   Hemoglobin 13.9 12.0 - 15.0 g/dL   HCT 63.8 75.6 - 43.3 %   MCV 84.6 80.0 - 100.0 fL   MCH 27.1 26.0 - 34.0 pg   MCHC 32.0 30.0 - 36.0 g/dL   RDW 29.5 18.8 - 41.6 %   Platelets 231 150 - 400 K/uL   nRBC 0.0 0.0 - 0.2 %    Comment: Performed at [facility], [address] [place], [place] [address]  Urinalysis, Routine w reflex microscopic -Urine, Clean Catch     Status: Abnormal   Collection Time: 11/13/22  8:49 PM  Result Value Ref Range   Color, Urine YELLOW YELLOW   APPearance CLEAR CLEAR   Specific Gravity, Urine 1.029 1.005 - 1.030   pH 5.0 5.0 - 8.0   Glucose, UA >=500 (A) NEGATIVE mg/dL   Hgb urine dipstick NEGATIVE NEGATIVE   Bilirubin Urine NEGATIVE NEGATIVE   Ketones, ur 20 (A) NEGATIVE mg/dL   Protein, ur NEGATIVE NEGATIVE mg/dL   Nitrite NEGATIVE NEGATIVE   Leukocytes,Ua NEGATIVE NEGATIVE   RBC / HPF 0-5 0 - 5 RBC/hpf   WBC, UA 6-10 0 - 5 WBC/hpf   Bacteria, UA MANY (A) NONE SEEN   Squamous Epithelial / HPF 0-5 0 - 5 /HPF     Comment: Performed at [facility], [address] [place], [place] [address]  CBG monitoring, ED     Status: Abnormal   Collection Time: 11/14/22  2:09 AM  Result Value Ref Range   Glucose-Capillary 181 (H) 70 - 99 mg/dL    Comment: Glucose reference range applies only to samples taken after fasting for at least 8 hours.  Troponin I (High Sensitivity)     Status: None   Collection Time: 11/14/22  2:34 AM  Result Value Ref Range   Troponin I (High Sensitivity) 7 <18 ng/L    Comment: (NOTE) Elevated high sensitivity troponin I (hsTnI) values and significant  changes across serial measurements may suggest ACS but many other  chronic and acute conditions are known to elevate hsTnI results.  Refer to the "Links" section for chest pain algorithms and additional  guidance. Performed at [facility], [address], [place], [place] [address]    CT ABDOMEN PELVIS W CONTRAST  Result Date: 11/14/2022 CLINICAL DATA:  Acute, nonlocalized a no pain EXAM: CT ABDOMEN AND PELVIS WITH CONTRAST TECHNIQUE: Multidetector CT imaging of the abdomen and pelvis was performed using the standard protocol following bolus administration of intravenous contrast. RADIATION DOSE REDUCTION: This exam was performed according to the departmental dose-optimization program which includes automated exposure control, adjustment of the mA and/or kV according to patient size and/or use of iterative reconstruction technique. CONTRAST:  75mL OMNIPAQUE IOHEXOL 350 MG/ML SOLN COMPARISON:  08/14/2021 FINDINGS: Lower chest: Mild linear atelectasis at the lung bases with some scarring. Hepatobiliary: No focal liver  abnormality.Biliary dilatation post cholecystectomy, possibly reservoir effect given stability, CBD measuring up to 14 mm. No calcified choledocholithiasis. Chart history of EUS. Pancreas: Generalized atrophy Spleen: Unremarkable. Adrenals/Urinary Tract: Negative adrenals. No hydronephrosis or stone.  Unremarkable bladder. Stomach/Bowel: Dilated and fluid-filled small bowel with fecalized material before a abrupt transition in the left paramedian abdomen where there is angulated bowel, the segment measures up to 3.6 cm in diameter and is associated with mild mesenteric stranding. 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 Distal colonic diverticulosis. Vascular/Lymphatic: No acute vascular abnormality. No mass or adenopathy. Reproductive:Hysterectomy Other: No ascites or pneumoperitoneum. Musculoskeletal: No acute abnormalities. IMPRESSION: 1. Small bowel obstruction in the left mid abdomen where there is angulation of bowel loops. 2. Chronic biliary dilatation post cholecystectomy. Electronically Signed   By: Tiburcio Pea M.D.   On: 11/14/2022 04:17   DG Abdomen 1 View  Result Date: 11/13/2022 CLINICAL DATA:  Abdomen pain EXAM: ABDOMEN - 1 VIEW COMPARISON:  CT 08/14/2021 FINDINGS: Postsurgical changes in the right mid quadrant. Nonobstructed gas pattern with mild stool burden. No radiopaque calculi IMPRESSION: Nonobstructed gas pattern. Electronically Signed   By: Jasmine Pang M.D.   On: 11/13/2022 21:54    Pending Labs Unresulted Labs (From admission, onward)     Start     Ordered   11/15/22 0500  Comprehensive metabolic panel  Tomorrow morning,   R        11/14/22 0557   11/15/22 0500  CBC  Tomorrow morning,   R        11/14/22 0557   11/15/22 0500  Basic metabolic panel  Daily,   R      11/14/22 0911   11/15/22 0500  CBC  Daily,   R      11/14/22 0911   11/15/22 0500  Magnesium  Daily,   R      11/14/22 0911   11/15/22 0500  Phosphorus  Tomorrow morning,   R        11/14/22 0911   11/14/22 0912  Vitamin B12  Once,   R        11/14/22 0911   11/14/22 0624  Creatinine, urine, random  Once,   R        11/14/22 0623   11/14/22 0624  Sodium, urine, random  Once,   R        11/14/22 0623   11/14/22 0558  HIV Antibody (routine  testing w rflx)  (HIV Antibody (Routine testing w reflex) panel)  Once,   R        11/14/22 0557   11/14/22 0124  Urine Culture  Add-on,   AD       Question:  Indication  Answer:  Bacteriuria screening (OB/GYN or Uro)   11/14/22 0124            Vitals/Pain Today's Vitals   11/14/22 0657 11/14/22 0823 11/14/22 0900 11/14/22 0926  BP:  134/72 (!) 146/75   Pulse:  85 84   Resp:  16 14   Temp:  98.2 F (36.8 C)    TempSrc:  Oral    SpO2:  92% 92%   Weight:      Height:      PainSc: 10-Worst pain ever   5     Isolation Precautions No active isolations  Medications Medications  heparin injection 5,000 Units (5,000 Units Subcutaneous Given 11/14/22 0656)  sodium chloride flush (NS) 0.9 % injection 3 mL (  has no administration in time range)  sodium chloride flush (NS) 0.9 % injection 3 mL (has no administration in time range)  sodium chloride flush (NS) 0.9 % injection 3 mL (has no administration in time range)  0.9 %  sodium chloride infusion (has no administration in time range)  acetaminophen (TYLENOL) tablet 650 mg (has no administration in time range)    Or  acetaminophen (TYLENOL) suppository 650 mg (has no administration in time range)  ondansetron (ZOFRAN) tablet 4 mg ( Oral See Alternative 11/14/22 0926)    Or  ondansetron (ZOFRAN) injection 4 mg (4 mg Intravenous Given 11/14/22 0926)  dextrose 50 % solution 50 mL (has no administration in time range)  ALPRAZolam (XANAX) tablet 0.5 mg (0.5 mg Oral Given 11/14/22 0927)  morphine (PF) 2 MG/ML injection 1 mg (1 mg Intravenous Given 11/14/22 0927)  diatrizoate meglumine-sodium (GASTROGRAFIN) 66-10 % solution 90 mL (has no administration in time range)  dextrose 5 % in lactated ringers infusion (has no administration in time range)  ipratropium-albuterol (DUONEB) 0.5-2.5 (3) MG/3ML nebulizer solution 3 mL (has no administration in time range)  senna-docusate (Senokot-S) tablet 1 tablet (has no administration in time range)   traZODone (DESYREL) tablet 50 mg (has no administration in time range)  glucagon (human recombinant) (GLUCAGEN) injection 1 mg (has no administration in time range)  hydrALAZINE (APRESOLINE) injection 10 mg (has no administration in time range)  metoprolol tartrate (LOPRESSOR) injection 5 mg (has no administration in time range)  insulin aspart (novoLOG) injection 0-9 Units (has no administration in time range)  acetaminophen (TYLENOL) tablet 500 mg ( Oral Not Given 11/14/22 0358)  HYDROmorphone (DILAUDID) injection 1 mg (1 mg Intravenous Given 11/14/22 0151)  lactated ringers bolus 1,000 mL (0 mLs Intravenous Stopped 11/14/22 0330)  ondansetron (ZOFRAN) injection 4 mg (4 mg Intravenous Given 11/14/22 0150)  iohexol (OMNIPAQUE) 350 MG/ML injection 75 mL (75 mLs Intravenous Contrast Given 11/14/22 0350)  HYDROmorphone (DILAUDID) injection 1 mg (1 mg Intravenous Given 11/14/22 0408)  HYDROmorphone (DILAUDID) injection 0.5 mg (0.5 mg Intravenous Given 11/14/22 0548)  metoCLOPramide (REGLAN) injection 10 mg (10 mg Intravenous Given 11/14/22 0540)    Mobility walks     Focused Assessments     R Recommendations: See Admitting Provider Note  Report given to:   Additional Notes:  Refused NG tube. No vomiting. Depression concerns related to recent losses.

## 2022-11-14 NOTE — H&P (Addendum)
History and Physical    Courtney [name] GNF:[id number] DOB: 09/08/1955 DOA: 11/13/2022  PCP: Rema Fendt, NP   Patient coming from: Home   Chief Complaint:  Chief Complaint  Patient presents with   Abdominal Pain    HPI:  Courtney [name] is a 67 y.o. female with medical history significant of significant for chronic anxiety disorder, depression, hypertension, insulin-dependent diabetes type 2, CKD stage 3A, small bowel obstruction x 2, diverticulitis, hernia status post ventral hernia repair with mesh placement in March 2024 and chronic osteoarthritis presents to emergency department for evaluation for abdominal pain around the umbilical region for 1 day.  Patient reported abdominal pain started yesterday it is cramping in nature 7 out of 10 intensity.  Patient unable to pass platters and had last bowel movement 24 hours ago.  Patient denies any nausea, vomiting, bloody stool, fever and chills.   ED Course:  At presentation to ED patient is hemodynamically stable. Lipase 21. CMP showed sodium 142, potassium 4.5, chloride 97, bicarb 26, blood glucose 175, BUN 14, creatinine 1.16, calcium 9.9, albumin 4.5, AST 23, ALT 16, alkaline phos 64, bilirubin 1.1, GFR 52 and elevated anion gap 19. CBC grossly unremarkable. UA blood glucose level 500, ketone positive and many bacteria. POC blood glucose 181. Troponin 7.  X ray abdomen nonobstructive gas pattern.  CT abdomen pelvis: 1. Small bowel obstruction in the left mid abdomen where there is angulation of bowel loops. 2. Chronic biliary dilatation post cholecystectomy.  ED physician discussed and consulted general surgery Dr. Violeta Gelinas who recommended to NG tube placement and IV fluid.  General surgery did evaluate patient soon. Of note, Dr. Clayborne Dana updating me that patient refusing NG tube placement.  During my evaluation patient reported that she has history of bilateral nasal sinus surgery and septal deviation which  makes it very difficult to place NG tube in the past.  That is why patient declining NG tube placement.  I have updated and informed general surgery Dr. Janee Morn.  Review of Systems:  Review of Systems  Constitutional:  Negative for chills, fever, malaise/fatigue and weight loss.  Respiratory:  Negative for cough and shortness of breath.   Cardiovascular:  Negative for chest pain.  Gastrointestinal:  Positive for abdominal pain and constipation. Negative for blood in stool, diarrhea, heartburn, nausea and vomiting.  Neurological:  Negative for dizziness and headaches.  Psychiatric/Behavioral:  The patient is nervous/anxious.     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   Bowel obstruction (HCC)    Depression with anxiety 02/09/2015   mild xanax prn   Diabetes mellitus (HCC) 07/18/2015   type 2   Diverticulitis    Dysrhythmia    abnormal ekgs worked up   Essential tremor 03/31/2016   Fibromyalgia    MS   Gait difficulty 02/09/2015   Gallstones    GERD (gastroesophageal reflux disease)    HLD (hyperlipidemia)    Hypersomnia 11/09/2015   Hypertension    no meds   Hyperthyroidism    patient states has no problems at present-01/25/2019   Hypothyroidism    patient states no problems at present 01/25/2019   IBS (irritable bowel syndrome)    Insomnia 02/09/2015   Migraines    MS (multiple sclerosis) (HCC)    Multiple joint pain 03/31/2016  Numbness in both hands 03/18/2016   Other fatigue 02/09/2015   Peptic ulcer    Pneumonia    multiple times. Last time 2014   PONV (postoperative nausea and vomiting)    Urinary hesitancy 02/09/2015   Vision abnormalities     Past Surgical History:  Procedure Laterality Date   ABDOMINAL HYSTERECTOMY  1976   including oophorectomy, due to uterine cancer   APPENDECTOMY  1974   BIOPSY   01/31/2019   Procedure: BIOPSY;  Surgeon: Napoleon Form, MD;  Location: [facility];  Service: Endoscopy;;   CARDIAC CATHETERIZATION     2001/2006 In [place]. Normal per patient   CARPAL TUNNEL RELEASE Left    carpal tunnel surgery   CHOLECYSTECTOMY     2006   COLON SURGERY     SBO x 2   COLONOSCOPY WITH PROPOFOL N/A 01/31/2019   Procedure: COLONOSCOPY WITH PROPOFOL;  Surgeon: Napoleon Form, MD;  Location: [facility];  Service: Endoscopy;  Laterality: N/A;   COLONOSCOPY WITH PROPOFOL N/A 04/24/2022   Procedure: COLONOSCOPY WITH PROPOFOL;  Surgeon: Napoleon Form, MD;  Location: [facility];  Service: Gastroenterology;  Laterality: N/A;   EUS N/A 04/09/2017   Procedure: UPPER ENDOSCOPIC ULTRASOUND (EUS) LINEAR;  Surgeon: Rachael Fee, MD;  Location: [facility];  Service: Endoscopy;  Laterality: N/A;   HERNIA REPAIR     INCISIONAL HERNIA REPAIR N/A 05/05/2022   Procedure: LAPAROSCOPIC VENTRAL HERNA REPAIR WITH MESH;  Surgeon: Fritzi Mandes, MD;  Location[facility] OR;  Service: General;  Laterality: N/A;   INSERTION OF MESH N/A 05/05/2022   Procedure: INSERTION OF MESH;  Surgeon: Fritzi Mandes, MD;  Location: [facility] OR;  Service: General;  Laterality: N/A;   MENISCUS REPAIR Left    POLYPECTOMY  01/31/2019   Procedure: POLYPECTOMY;  Surgeon: Napoleon Form, MD;  Location: [facility];  Service: Endoscopy;;   POLYPECTOMY  04/24/2022   Procedure: POLYPECTOMY;  Surgeon: Napoleon Form, MD;  Location: [facility];  Service: Gastroenterology;;   SMALL INTESTINE SURGERY     toenail removal Right 12/2017   great toenail      reports that she quit smoking about 34 years ago. Her smoking use included cigarettes. She started smoking about 39 years ago. She has a 1.3 pack-year smoking history. She has never been exposed to tobacco smoke. She has never used smokeless tobacco. She reports that she does not currently use alcohol. She reports current drug use. Drug:  Oxycodone.  Allergies  Allergen Reactions   Ibuprofen Other (See Comments)    Developed a ulcer     Methylprednisolone Anaphylaxis, Shortness Of Breath and Palpitations   Naproxen Other (See Comments)    Developed a ulcer    Nsaids Other (See Comments)    Pt gets Ulcers    Prednisone Anaphylaxis, Shortness Of Breath and Palpitations   Bactrim [Sulfamethoxazole-Trimethoprim] Other (See Comments)    "blotchiness" redness, face swelling   Biaxin [Clarithromycin] Swelling   Sulfa Antibiotics Swelling and Other (See Comments)    Angioedema    Ozempic (0.25 Or 0.5 Mg-Dose) [Semaglutide(0.25 Or 0.5mg -Dos)] Rash    Family History  Problem Relation Age of Onset   Heart disease Mother    Kidney disease Mother    Diabetes Mother    Other Father 12       murdered   Multiple sclerosis Daughter    Multiple sclerosis Other    Diabetes Sister    Alcohol abuse Brother  ETOH and marijuana   Diabetes Brother    Diabetes Sister    Diabetes Brother    ALS Brother     Prior to Admission medications   Medication Sig Start Date End Date Taking? Authorizing Provider  acetaminophen (TYLENOL) 500 MG tablet Take 1,000 mg by mouth every 8 (eight) hours as needed for headache.    [provider]  ALPRAZolam Prudy Feeler) 1 MG tablet Take 0.5-1 mg by mouth 3 (three) times daily as needed for anxiety.    [provider]  Blood Glucose Monitoring Suppl (FREESTYLE LITE) w/Device KIT 1 Device by Does not apply route daily in the afternoon. 06/19/22   Shamleffer, Konrad Dolores, MD  Continuous Blood Gluc Sensor (DEXCOM G7 SENSOR) MISC 1 Device by Does not apply route as directed. 06/06/22   Shamleffer, Konrad Dolores, MD  empagliflozin (JARDIANCE) 25 MG TABS tablet Take 1 tablet (25 mg total) by mouth daily. 06/06/22   Shamleffer, Konrad Dolores, MD  Estradiol 10 MCG TABS vaginal tablet Place 1 tablet (10 mcg total) vaginally at bedtime for 14 days, THEN 1 tablet (10 mcg total) 2 (two)  times a week. 03/18/22 04/01/23  Lennart Pall, MD  glucose blood (FREESTYLE LITE) test strip 1 each by Other route in the morning, at noon, in the evening, and at bedtime. Use as instructed 06/20/22   Shamleffer, Konrad Dolores, MD  hydrocortisone cream 0.5 % Apply 1 Application topically 2 (two) times daily. Patient not taking: Reported on 10/08/2022 06/04/22   Shamleffer, Konrad Dolores, MD  insulin aspart (NOVOLOG FLEXPEN) 100 UNIT/ML FlexPen Max daily 30 units 06/20/22   Shamleffer, Konrad Dolores, MD  insulin glargine (LANTUS SOLOSTAR) 100 UNIT/ML Solostar Pen Inject 12 Units into the skin daily. 06/20/22   Shamleffer, Konrad Dolores, MD  Insulin Pen Needle 32G X 4 MM MISC 1 Device by Does not apply route in the morning, at noon, in the evening, and at bedtime. 06/19/22   Shamleffer, Konrad Dolores, MD  ketoconazole (NIZORAL) 2 % cream Apply 1 Application topically daily. 07/30/22   Rema Fendt, NP  Lancets (FREESTYLE) lancets 1 Device by Misc.(Non-Drug; Combo Route) route daily. USED TO CHECK BLOOD SUGAR TID 06/04/22   Shamleffer, Konrad Dolores, MD  latanoprost (XALATAN) 0.005 % ophthalmic solution Place 1 drop into both eyes at bedtime. 02/06/22   [provider]  loratadine (CLARITIN) 10 MG tablet Take 10 mg by mouth daily as needed for allergies (seasonal). 01/05/20   [provider]  modafinil (PROVIGIL) 100 MG tablet Take 25 mg by mouth daily as needed (Takes to help stay awake due to insomnia, takes at 6a if needed). 02/19/22   [provider]  Multiple Vitamins-Minerals (MULTIVITAMIN ADULT) CHEW Chew 2 each by mouth 2 (two) times a week. Patient not taking: Reported on 10/08/2022    [provider]  mupirocin ointment (BACTROBAN) 2 % Apply 1 Application topically 2 (two) times daily. 07/30/22   Rema Fendt, NP  naloxone [facility]) nasal spray 4 mg/0.1 mL Place 1 spray into the nose daily as needed (overdose).    [provider]  ondansetron  (ZOFRAN) 4 MG tablet Take 1 tablet (4 mg total) by mouth every 6 (six) hours as needed for nausea or vomiting. Prior to colonoscopy prep 05/05/22   Fritzi Mandes, MD  oxyCODONE (ROXICODONE) 15 MG immediate release tablet Take 15 mg by mouth 5 (five) times daily as needed for pain. 08/02/21   [provider]  pantoprazole (PROTONIX) 40 MG  tablet Take 1 tablet (40 mg total) by mouth daily. 01/29/22   Esterwood, Amy S, PA-C  terconazole (TERAZOL 3) 0.8 % vaginal cream Place 1 applicator vaginally at bedtime. 09/26/22   Rema Fendt, NP  sucralfate (CARAFATE) 1 g tablet Take 1 tablet (1 g total) by mouth 4 (four) times daily -  before meals and at bedtime. Dissolve 1 tablet in 2-4 oz of water, drink as slurry 08/10/19 02/29/20  Napoleon Form, MD     Physical Exam: Vitals:   11/14/22 0221 11/14/22 0300 11/14/22 0315 11/14/22 0500  BP: 133/74 131/72  126/67  Pulse: (!) 2 85  89  Resp: 13 16  15   Temp: 98.2 F (36.8 C)     TempSrc: Oral     SpO2: 96%  94% 92%  Weight:      Height:        Physical Exam Constitutional:      Appearance: She is ill-appearing.  Cardiovascular:     Rate and Rhythm: Normal rate and regular rhythm.     Heart sounds: Normal heart sounds.  Pulmonary:     Effort: Pulmonary effort is normal.     Breath sounds: Normal breath sounds.  Abdominal:     General: Abdomen is flat. A surgical scar is present. Bowel sounds are decreased. There is no distension.     Palpations: Abdomen is soft.     Tenderness: There is generalized abdominal tenderness. There is no guarding or rebound.     Hernia: No hernia is present.  Skin:    General: Skin is dry.     Capillary Refill: Capillary refill takes less than 2 seconds.  Neurological:     Mental Status: She is oriented to person, place, and time.  Psychiatric:        Mood and Affect: Mood is anxious.        Behavior: Behavior normal.      Labs on Admission: I have personally reviewed following labs and  imaging studies  CBC: Recent Labs  Lab 11/13/22 2026  WBC 7.9  HGB 13.9  HCT 43.4  MCV 84.6  PLT 231   Basic Metabolic Panel: Recent Labs  Lab 11/13/22 2026  NA 142  K 4.5  CL 97*  CO2 26  GLUCOSE 175*  BUN 14  CREATININE 1.16*  CALCIUM 9.9   GFR: Estimated Creatinine Clearance: 43.2 mL/min (A) (by C-G formula based on SCr of 1.16 mg/dL (H)). Liver Function Tests: Recent Labs  Lab 11/13/22 2026  AST 23  ALT 16  ALKPHOS 64  BILITOT 1.1  PROT 8.0  ALBUMIN 4.5   Recent Labs  Lab 11/13/22 2026  LIPASE 21   No results for input(s): "AMMONIA" in the last 168 hours. Coagulation Profile: No results for input(s): "INR", "PROTIME" in the last 168 hours. Cardiac Enzymes: Recent Labs  Lab 11/14/22 0234  TROPONINIHS 7   BNP (last 3 results) No results for input(s): "BNP" in the last 8760 hours. HbA1C: No results for input(s): "HGBA1C" in the last 72 hours. CBG: Recent Labs  Lab 11/14/22 0209  GLUCAP 181*   Lipid Profile: No results for input(s): "CHOL", "HDL", "LDLCALC", "TRIG", "CHOLHDL", "LDLDIRECT" in the last 72 hours. Thyroid Function Tests: No results for input(s): "TSH", "T4TOTAL", "FREET4", "T3FREE", "THYROIDAB" in the last 72 hours. Anemia Panel: No results for input(s): "VITAMINB12", "FOLATE", "FERRITIN", "TIBC", "IRON", "RETICCTPCT" in the last 72 hours. Urine analysis:    Component Value Date/Time   COLORURINE YELLOW 11/13/2022 2049  APPEARANCEUR CLEAR 11/13/2022 2049   LABSPEC 1.029 11/13/2022 2049   PHURINE 5.0 11/13/2022 2049   GLUCOSEU >=500 (A) 11/13/2022 2049   HGBUR NEGATIVE 11/13/2022 2049   BILIRUBINUR NEGATIVE 11/13/2022 2049   BILIRUBINUR negative 08/08/2022 1629   KETONESUR 20 (A) 11/13/2022 2049   PROTEINUR NEGATIVE 11/13/2022 2049   UROBILINOGEN 0.2 08/08/2022 1629   NITRITE NEGATIVE 11/13/2022 2049   LEUKOCYTESUR NEGATIVE 11/13/2022 2049    Radiological Exams on Admission: I have personally reviewed images CT  ABDOMEN PELVIS W CONTRAST  Result Date: 11/14/2022 CLINICAL DATA:  Acute, nonlocalized a no pain EXAM: CT ABDOMEN AND PELVIS WITH CONTRAST TECHNIQUE: Multidetector CT imaging of the abdomen and pelvis was performed using the standard protocol following bolus administration of intravenous contrast. RADIATION DOSE REDUCTION: This exam was performed according to the departmental dose-optimization program which includes automated exposure control, adjustment of the mA and/or kV according to patient size and/or use of iterative reconstruction technique. CONTRAST:  75mL OMNIPAQUE IOHEXOL 350 MG/ML SOLN COMPARISON:  08/14/2021 FINDINGS: Lower chest: Mild linear atelectasis at the lung bases with some scarring. Hepatobiliary: No focal liver abnormality.Biliary dilatation post cholecystectomy, possibly reservoir effect given stability, CBD measuring up to 14 mm. No calcified choledocholithiasis. Chart history of EUS. Pancreas: Generalized atrophy Spleen: Unremarkable. Adrenals/Urinary Tract: Negative adrenals. No hydronephrosis or stone. Unremarkable bladder. Stomach/Bowel: Dilated and fluid-filled small bowel with fecalized material before a abrupt transition in the left paramedian abdomen where there is angulated bowel, the segment measures up to 3.6 cm in diameter and is associated with mild mesenteric stranding. 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 Distal colonic diverticulosis. Vascular/Lymphatic: No acute vascular abnormality. No mass or adenopathy. Reproductive:Hysterectomy Other: No ascites or pneumoperitoneum. Musculoskeletal: No acute abnormalities. IMPRESSION: 1. Small bowel obstruction in the left mid abdomen where there is angulation of bowel loops. 2. Chronic biliary dilatation post cholecystectomy. Electronically Signed   By: Tiburcio Pea M.D.   On: 11/14/2022 04:17   DG Abdomen 1 View  Result Date: 11/13/2022 CLINICAL DATA:  Abdomen pain  EXAM: ABDOMEN - 1 VIEW COMPARISON:  CT 08/14/2021 FINDINGS: Postsurgical changes in the right mid quadrant. Nonobstructed gas pattern with mild stool burden. No radiopaque calculi IMPRESSION: Nonobstructed gas pattern. Electronically Signed   By: Jasmine Pang M.D.   On: 11/13/2022 21:54    EKG: My personal interpretation of EKG shows: Sinus rhythm heart rate 85.  There is no ST tube abnormality.    Assessment/Plan: Principal Problem:   SBO (small bowel obstruction) (HCC) Active Problems:   H/O umbilical hernia repair with mesh placement 05/2022   Essential hypertension   CKD stage 3a, GFR 45-59 ml/min (HCC)   Insulin dependent type 2 diabetes mellitus (HCC)   Depression   AKI (acute kidney injury) (HCC)    Assessment and Plan: Small bowel obstruction History of small bowel obstruction x 2 History of umbilical hernia repair with mesh placement in March 2024 - Patient presenting with complaining of midepigastric abdominal pain for last 1 day.  Unable to pass any flatus and no bowel movement in last 24 hours.  Patient history of small bowel obstruction in the past as well as umbilical hernia status postrepair in 05/2022. -CT abdomen pelvis showed small bowel obstruction in the left midabdomen hide there is an angulation of bowel loops.  Chronic biliary dilation postcholecystectomy. -X-ray abdomen nonobstructed gas pattern. - General Surgery Dr. Janee Morn has been consulted recommended NG tube placement and continue IV fluid.  However patient declining NG tube placement due to history of sinus surgery and deviated nasal septum. Updated general surgery about this. - Keeping patient n.p.o. - Continue maintenance fluid LR 125 cc/h for 2 days -New morphine 1 mg every 3 hours as needed for moderate and severe pain. -Continue Zofran as needed. -Will follow-up with further general surgery plan and recommendation.  Acute kidney injury on CKD stage 3A - Elevated creatinine 1.16.  Patient's  baseline creatinine around 1.06.  Baseline between GFR 47-52. Acute kidney injury in the setting of oral intake for last 24 hours -Checking urine creatinine and urine sodium. - Continue maintenance fluid LR 125 cc/h. - Continue to monitor renal function for improvement - Continue monitor urine output - Avoid nephrotoxic agent  History of essential hypertension -Patient blood pressure is borderline elevated 160/91 likely secondary in the setting of abdominal pain in the context of SBO.  Blood has been improved 126/87 and heart rate 89. - At home currently patient is not any blood pressure regimen. - Continue to monitor blood pressure - Continue hydralazine as needed.  Insulin-dependent DM type II -A1c 6.7 in April 2024.  Holding home insulin regimen as patient is n.p.o. - Continue to check POC blood glucose every 6 hourS and dextrose as needed.  Generalized anxiety disorder Chronic depression -Continue Xanax 0.5 mg 3 times daily as needed    DVT prophylaxis:  SQ Heparin and  SCD Code Status:  Full Code Diet: Currently n.p.o. Family Communication: Discussed treatment plan with patient's family member at the bedside Disposition Plan: Pending clinical improvement. Consults: General Surgery Admission status:   Inpatient, Med-Surg  Severity of Illness: The appropriate patient status for this patient is INPATIENT. Inpatient status is judged to be reasonable and necessary in order to provide the required intensity of service to ensure the patient's safety. [name] presenting symptoms, physical exam findings, and initial radiographic and laboratory data in the context of their chronic comorbidities is felt to place them at high risk for further clinical deterioration. Furthermore, it is not anticipated that the patient will be medically stable for discharge from the hospital within 2 midnights of admission.   * I certify that at the point of admission it is my clinical judgment that  the patient will require inpatient hospital care spanning beyond 2 midnights from the point of admission due to high intensity of service, high risk for further deterioration and high frequency of surveillance required.Marland Kitchen    Tereasa Coop, MD [facility]  How to contact the [facility] Attending or Consulting provider 7A - 7P or covering provider during after hours 7P -7A, for this patient.  Check the care team in [facility] and look for a) attending/consulting [facility] provider listed and b) the [facility] team listed Log into [email or web address] and use [place]'s universal password to access. If you do not have the password, please contact the hospital operator. Locate the [facility] provider you are looking for under [facility] and page to a number that you can be directly reached. If you still have difficulty reaching the provider, please page the [facility] (Director on Call) for the Hospitalists listed on amion for assistance.  11/14/2022, 6:25 AM

## 2022-11-14 NOTE — Progress Notes (Signed)
Hello this patient is c/o 10/10 pain admit with SBO 9/12she was given dilaudid 0.5 IV at 1850 she has NG tube low suction, BP 159/79 HR 80 she is stating no relief via chat

## 2022-11-15 ENCOUNTER — Inpatient Hospital Stay (HOSPITAL_COMMUNITY): Payer: Medicare Other

## 2022-11-15 DIAGNOSIS — K56609 Unspecified intestinal obstruction, unspecified as to partial versus complete obstruction: Secondary | ICD-10-CM | POA: Diagnosis not present

## 2022-11-15 LAB — GLUCOSE, CAPILLARY
Glucose-Capillary: 187 mg/dL — ABNORMAL HIGH (ref 70–99)
Glucose-Capillary: 196 mg/dL — ABNORMAL HIGH (ref 70–99)
Glucose-Capillary: 233 mg/dL — ABNORMAL HIGH (ref 70–99)
Glucose-Capillary: 206 mg/dL — ABNORMAL HIGH (ref 70–99)
Glucose-Capillary: 164 mg/dL — ABNORMAL HIGH (ref 70–99)

## 2022-11-15 LAB — COMPREHENSIVE METABOLIC PANEL
ALT: 15 U/L (ref 0–44)
AST: 20 U/L (ref 15–41)
Albumin: 3.9 g/dL (ref 3.5–5.0)
Alkaline Phosphatase: 52 U/L (ref 38–126)
Anion gap: 15 (ref 5–15)
BUN: 14 mg/dL (ref 8–23)
CO2: 21 mmol/L — ABNORMAL LOW (ref 22–32)
Calcium: 9.1 mg/dL (ref 8.9–10.3)
Chloride: 105 mmol/L (ref 98–111)
Creatinine, Ser: 1.18 mg/dL — ABNORMAL HIGH (ref 0.44–1.00)
GFR, Estimated: 51 mL/min — ABNORMAL LOW (ref >60)
Glucose, Bld: 183 mg/dL — ABNORMAL HIGH (ref 70–99)
Potassium: 4.1 mmol/L (ref 3.5–5.1)
Sodium: 141 mmol/L (ref 135–145)
Total Bilirubin: 1.1 mg/dL (ref 0.3–1.2)
Total Protein: 7.5 g/dL (ref 6.5–8.1)

## 2022-11-15 LAB — CBC
HCT: 41.3 % (ref 36.0–46.0)
Hemoglobin: 13.5 g/dL (ref 12.0–15.0)
MCH: 28 pg (ref 26.0–34.0)
MCHC: 32.7 g/dL (ref 30.0–36.0)
MCV: 85.7 fL (ref 80.0–100.0)
Platelets: 234 10*3/uL (ref 150–400)
RBC: 4.82 MIL/uL (ref 3.87–5.11)
RDW: 13.3 % (ref 11.5–15.5)
WBC: 11.8 10*3/uL — ABNORMAL HIGH (ref 4.0–10.5)
nRBC: 0 % (ref 0.0–0.2)

## 2022-11-15 LAB — URINE CULTURE: Culture: NO GROWTH

## 2022-11-15 LAB — PHOSPHORUS: Phosphorus: 3.8 mg/dL (ref 2.5–4.6)

## 2022-11-15 LAB — MAGNESIUM: Magnesium: 2.4 mg/dL (ref 1.7–2.4)

## 2022-11-15 NOTE — Progress Notes (Signed)
Mobility Specialist: Progress Note   11/15/22 1610  Mobility  Activity Ambulated independently in room  Level of Assistance Independent  Assistive Device None  Distance Ambulated (ft) 300 ft  Activity Response Tolerated well  Mobility Referral Yes  $Mobility charge 1 Mobility  Mobility Specialist Start Time (ACUTE ONLY) 1358  Mobility Specialist Stop Time (ACUTE ONLY) 1414  Mobility Specialist Time Calculation (min) (ACUTE ONLY) 16 min    Pt was agreeable to mobility session - received in bed. Asymptomatic throughout. Ms did observed pt ambulating with a slowly gait and became a little unsteady at times but no LOB. Returned to room without fault. Left in bed with all needs met, call bell in reach.   Maurene Capes Mobility Specialist Please contact via SecureChat or Rehab office at [phone]

## 2022-11-15 NOTE — Hospital Course (Addendum)
  Brief Narrative:  67 year old with history of HTN, DM 2, CKD stage IIIa, diverticulitis, SBO with multiple abdominal surgeries in the past admitted for abdominal pain.  She has had prior abdominal surgeries including hernia repair in 90s and small bowel obstruction which [place], last 1 was ventral hernia repair with mesh in March 2024.  Upon admission she was diagnosed with small bowel obstruction secondary to adhesion.  General surgery was consulted.  Gastrografin/SBO protocol was initiated.  Initially refused NG tube but eventually placed due to worsening symptoms.  Eventually NG tube removed, started on oral diet.  Advanced slowly.     Assessment & Plan:  Principal Problem:   SBO (small bowel obstruction) (HCC) Active Problems:   H/O umbilical hernia repair with mesh placement 05/2022   Essential hypertension   CKD stage 3a, GFR 45-59 ml/min (HCC)   Insulin dependent type 2 diabetes mellitus (HCC)   Depression   AKI (acute kidney injury) (HCC)   Small bowel obstruction History of small bowel obstruction x 2 History of umbilical hernia repair with mesh placement in March 2024 Suspect this is secondary to adhesions.  SBO protocol with Gastrografin.  Management per gen Surg.  Advance diet as tolerated per surgery.   CKD stage 2 Renal function stable around 1.1   History of essential hypertension Currently no meds on hold.  IV as needed   Insulin-dependent DM type II -A1c 6.7 in April 2024.  Holding home medications.  Sliding scale and Accu-Cheks   Generalized anxiety disorder Chronic depression -Continue Xanax 0.5 mg 3 times daily as needed   Left Arm Cast -[facility] notified to take a look at this.  Discussed with Dr. Frazier Butt on 9/13 by me.        DVT prophylaxis: Subcu heparin Code Status: Full code Family Communication:   Status is: Inpatient Remains inpatient appropriate because: Continue hospital stay until improvement in her small bowel  obstruction

## 2022-11-15 NOTE — Progress Notes (Signed)
PROGRESS NOTE    Courtney [name]  WNU:[id number] DOB: September 08, 1955 DOA: 11/13/2022 PCP: Rema Fendt, NP     Brief Narrative:  67 year old with history of HTN, DM 2, CKD stage IIIa, diverticulitis, SBO with multiple abdominal surgeries in the past admitted for abdominal pain.  She has had prior abdominal surgeries including hernia repair in 90s and small bowel obstruction which [place], last 1 was ventral hernia repair with mesh in March 2024.  Upon admission she was diagnosed with small bowel obstruction secondary to adhesion.  General surgery was consulted.  Gastrografin/SBO protocol was initiated.  Initially refused NG tube but eventually placed due to worsening symptoms     Assessment & Plan:  Principal Problem:   SBO (small bowel obstruction) (HCC) Active Problems:   H/O umbilical hernia repair with mesh placement 05/2022   Essential hypertension   CKD stage 3a, GFR 45-59 ml/min (HCC)   Insulin dependent type 2 diabetes mellitus (HCC)   Depression   AKI (acute kidney injury) (HCC)   Small bowel obstruction History of small bowel obstruction x 2 History of umbilical hernia repair with mesh placement in March 2024 Suspect this is secondary to adhesions.  SBO protocol with Gastrografin.  Plans for clamping trial today   CKD stage 2 Renal function stable around 1.1   History of essential hypertension Currently no meds on hold.  IV as needed   Insulin-dependent DM type II -A1c 6.7 in April 2024.  Holding home medications.  Sliding scale and Accu-Cheks   Generalized anxiety disorder Chronic depression -Continue Xanax 0.5 mg 3 times daily as needed   Left Arm Cast -[facility] notified to take a look at this.  Discussed with Dr. Frazier Butt       DVT prophylaxis: Subcu heparin Code Status: Full code Family Communication:   Status is: Inpatient Remains inpatient appropriate because: Continue hospital stay until improvement in her small bowel  obstruction             Subjective: Doing okay, reports passing gas.  Would like something orally   Examination:  General exam: Appears calm and comfortable  Respiratory system: Clear to auscultation. Respiratory effort normal. Cardiovascular system: S1 & S2 heard, RRR. No JVD, murmurs, rubs, gallops or clicks. No pedal edema. Gastrointestinal system: Abdomen is nondistended, soft and nontender. No organomegaly or masses felt. Normal bowel sounds heard. Central nervous system: Alert and oriented. No focal neurological deficits. Extremities: Symmetric 5 x 5 power. Skin: No rashes, lesions or ulcers Psychiatry: Judgement and insight appear normal. Mood & affect appropriate. NG tube in place      Diet Orders (From admission, onward)     Start     Ordered   11/15/22 1116  Diet clear liquid Room service appropriate? Yes; Fluid consistency: Thin  Diet effective now       Comments: If patient develops n/v. Place ngt back to suction  Question Answer Comment  Room service appropriate? Yes   Fluid consistency: Thin      11/15/22 1116            Objective: Vitals:   11/14/22 1952 11/15/22 0319 11/15/22 0500 11/15/22 0846  BP: (!) 159/79 127/70  115/73  Pulse: 80 79  80  Resp: 16 18  14   Temp: 98.2 F (36.8 C) 98.3 F (36.8 C)  98.6 F (37 C)  TempSrc: Oral Oral  Oral  SpO2: 94% 92%  94%  Weight:   68.9 kg  Height:        Intake/Output Summary (Last 24 hours) at 11/15/2022 1122 Last data filed at 11/14/2022 2039 Gross per 24 hour  Intake 610.73 ml  Output 200 ml  Net 410.73 ml   Filed Weights   11/13/22 2015 11/15/22 0500  Weight: 70.3 kg 68.9 kg    Scheduled Meds:  heparin  5,000 Units Subcutaneous Q8H   influenza vaccine adjuvanted  0.5 mL Intramuscular Tomorrow-1000   insulin aspart  0-9 Units Subcutaneous Q6H   sodium chloride flush  3 mL Intravenous Q12H   sodium chloride flush  3 mL Intravenous Q12H   Continuous Infusions:  sodium chloride       Nutritional status     Body mass index is 27.78 kg/m.  Data Reviewed:   CBC: Recent Labs  Lab 11/13/22 2026 11/15/22 0323  WBC 7.9 11.8*  HGB 13.9 13.5  HCT 43.4 41.3  MCV 84.6 85.7  PLT 231 234   Basic Metabolic Panel: Recent Labs  Lab 11/13/22 2026 11/15/22 0323  NA 142 141  K 4.5 4.1  CL 97* 105  CO2 26 21*  GLUCOSE 175* 183*  BUN 14 14  CREATININE 1.16* 1.18*  CALCIUM 9.9 9.1  MG  --  2.4  PHOS  --  3.8   GFR: Estimated Creatinine Clearance: 42.1 mL/min (A) (by C-G formula based on SCr of 1.18 mg/dL (H)). Liver Function Tests: Recent Labs  Lab 11/13/22 2026 11/15/22 0323  AST 23 20  ALT 16 15  ALKPHOS 64 52  BILITOT 1.1 1.1  PROT 8.0 7.5  ALBUMIN 4.5 3.9   Recent Labs  Lab 11/13/22 2026  LIPASE 21   No results for input(s): "AMMONIA" in the last 168 hours. Coagulation Profile: No results for input(s): "INR", "PROTIME" in the last 168 hours. Cardiac Enzymes: No results for input(s): "CKTOTAL", "CKMB", "CKMBINDEX", "TROPONINI" in the last 168 hours. BNP (last 3 results) No results for input(s): "PROBNP" in the last 8760 hours. HbA1C: No results for input(s): "HGBA1C" in the last 72 hours. CBG: Recent Labs  Lab 11/14/22 1444 11/14/22 2041 11/14/22 2333 11/15/22 0257 11/15/22 0600  GLUCAP 215* 218* 194* 187* 196*   Lipid Profile: No results for input(s): "CHOL", "HDL", "LDLCALC", "TRIG", "CHOLHDL", "LDLDIRECT" in the last 72 hours. Thyroid Function Tests: No results for input(s): "TSH", "T4TOTAL", "FREET4", "T3FREE", "THYROIDAB" in the last 72 hours. Anemia Panel: Recent Labs    11/14/22 1026  VITAMINB12 356   Sepsis Labs: No results for input(s): "PROCALCITON", "LATICACIDVEN" in the last 168 hours.  No results found for this or any previous visit (from the past 240 hour(s)).       Radiology Studies: DG Abd Portable 1V  Result Date: 11/15/2022 CLINICAL DATA:  Small-bowel obstruction protocol. EXAM: PORTABLE ABDOMEN -  1 VIEW COMPARISON:  11/14/2022 and 11/13/2022.  CT, 11/14/2022. FINDINGS: Central small bowel dilation is similar to the previous exam. Contrast, however, has moved from the small bowel into the colon. No colonic dilation. Stable well-positioned nasogastric tube. IMPRESSION: 1. Persistent central small-bowel dilation, but passage of contrast into the colon. This supports small central small-bowel dilation as focal adynamic ileus versus obstruction. Electronically Signed   By: Amie Portland M.D.   On: 11/15/2022 10:18   DG Abd Portable 1V  Result Date: 11/14/2022 CLINICAL DATA:  Small-bowel obstruction, nasogastric tube placement. Periumbilical abdominal pain. EXAM: PORTABLE ABDOMEN - 1 VIEW COMPARISON:  11/13/2022 FINDINGS: In is a gastric tube is present with side port at the gastric  cardia and tip in the gastric body. A small amount of contrast medium is present in the stomach, primarily along the fundus. Right upper quadrant clips from prior cholecystectomy. Suspected mildly dilated small bowel loop in the central upper abdomen with some dilute contrast medium in this vicinity. Moderate degenerative hip arthropathy bilaterally. Mild atelectasis along the left hemidiaphragm as shown on CT scan of 11/14/2022. IMPRESSION: 1. Gastric tube tip in the gastric body. 2. Suspected mildly dilated small bowel loop in the central upper abdomen with some dilute contrast medium in this vicinity. 3. Mild atelectasis along the left hemidiaphragm. Electronically Signed   By: Gaylyn Rong M.D.   On: 11/14/2022 20:26   CT ABDOMEN PELVIS W CONTRAST  Result Date: 11/14/2022 CLINICAL DATA:  Acute, nonlocalized a no pain EXAM: CT ABDOMEN AND PELVIS WITH CONTRAST TECHNIQUE: Multidetector CT imaging of the abdomen and pelvis was performed using the standard protocol following bolus administration of intravenous contrast. RADIATION DOSE REDUCTION: This exam was performed according to the departmental dose-optimization  program which includes automated exposure control, adjustment of the mA and/or kV according to patient size and/or use of iterative reconstruction technique. CONTRAST:  75mL OMNIPAQUE IOHEXOL 350 MG/ML SOLN COMPARISON:  08/14/2021 FINDINGS: Lower chest: Mild linear atelectasis at the lung bases with some scarring. Hepatobiliary: No focal liver abnormality.Biliary dilatation post cholecystectomy, possibly reservoir effect given stability, CBD measuring up to 14 mm. No calcified choledocholithiasis. Chart history of EUS. Pancreas: Generalized atrophy Spleen: Unremarkable. Adrenals/Urinary Tract: Negative adrenals. No hydronephrosis or stone. Unremarkable bladder. Stomach/Bowel: Dilated and fluid-filled small bowel with fecalized material before a abrupt transition in the left paramedian abdomen where there is angulated bowel, the segment measures up to 3.6 cm in diameter and is associated with mild mesenteric stranding. 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 Distal colonic diverticulosis. Vascular/Lymphatic: No acute vascular abnormality. No mass or adenopathy. Reproductive:Hysterectomy Other: No ascites or pneumoperitoneum. Musculoskeletal: No acute abnormalities. IMPRESSION: 1. Small bowel obstruction in the left mid abdomen where there is angulation of bowel loops. 2. Chronic biliary dilatation post cholecystectomy. Electronically Signed   By: Tiburcio Pea M.D.   On: 11/14/2022 04:17   DG Abdomen 1 View  Result Date: 11/13/2022 CLINICAL DATA:  Abdomen pain EXAM: ABDOMEN - 1 VIEW COMPARISON:  CT 08/14/2021 FINDINGS: Postsurgical changes in the right mid quadrant. Nonobstructed gas pattern with mild stool burden. No radiopaque calculi IMPRESSION: Nonobstructed gas pattern. Electronically Signed   By: Jasmine Pang M.D.   On: 11/13/2022 21:54           LOS: 1 day   Time spent= 35 mins    Miguel Rota, MD [facility]  If  7PM-7AM, please contact night-coverage  11/15/2022, 11:22 AM

## 2022-11-15 NOTE — Progress Notes (Signed)
Subjective/Chief Complaint: Patient threw up and had NGT placed, but oral gastrograffin made it to the colon.  Now has had some flatus.     Objective: Vital signs in last 24 hours: Temp:  [98.1 F (36.7 C)-98.6 F (37 C)] 98.6 F (37 C) (09/14 0846) Pulse Rate:  [78-80] 80 (09/14 0846) Resp:  [14-18] 14 (09/14 0846) BP: (115-159)/(70-79) 115/73 (09/14 0846) SpO2:  [92 %-94 %] 94 % (09/14 0846) Weight:  [68.9 kg] 68.9 kg (09/14 0500) Last BM Date : 11/13/22  Intake/Output from previous day: 09/13 0701 - 09/14 0700 In: 610.7 [I.V.:610.7] Out: 200 [Emesis/NG output:200] Intake/Output this shift: No intake/output data recorded.  General appearance: alert, cooperative, and no distress Resp: breathing comfortably GI: soft, non distended Extremities: left wrist in splint  Lab Results:  Recent Labs    11/13/22 2026 11/15/22 0323  WBC 7.9 11.8*  HGB 13.9 13.5  HCT 43.4 41.3  PLT 231 234   BMET Recent Labs    11/13/22 2026 11/15/22 0323  NA 142 141  K 4.5 4.1  CL 97* 105  CO2 26 21*  GLUCOSE 175* 183*  BUN 14 14  CREATININE 1.16* 1.18*  CALCIUM 9.9 9.1   PT/INR No results for input(s): "LABPROT", "INR" in the last 72 hours. ABG No results for input(s): "PHART", "HCO3" in the last 72 hours.  Invalid input(s): "PCO2", "PO2"  Studies/Results: DG Abd Portable 1V  Result Date: 11/15/2022 CLINICAL DATA:  Small-bowel obstruction protocol. EXAM: PORTABLE ABDOMEN - 1 VIEW COMPARISON:  11/14/2022 and 11/13/2022.  CT, 11/14/2022. FINDINGS: Central small bowel dilation is similar to the previous exam. Contrast, however, has moved from the small bowel into the colon. No colonic dilation. Stable well-positioned nasogastric tube. IMPRESSION: 1. Persistent central small-bowel dilation, but passage of contrast into the colon. This supports small central small-bowel dilation as focal adynamic ileus versus obstruction. Electronically Signed   By: Amie Portland M.D.   On:  11/15/2022 10:18   DG Abd Portable 1V  Result Date: 11/14/2022 CLINICAL DATA:  Small-bowel obstruction, nasogastric tube placement. Periumbilical abdominal pain. EXAM: PORTABLE ABDOMEN - 1 VIEW COMPARISON:  11/13/2022 FINDINGS: In is a gastric tube is present with side port at the gastric cardia and tip in the gastric body. A small amount of contrast medium is present in the stomach, primarily along the fundus. Right upper quadrant clips from prior cholecystectomy. Suspected mildly dilated small bowel loop in the central upper abdomen with some dilute contrast medium in this vicinity. Moderate degenerative hip arthropathy bilaterally. Mild atelectasis along the left hemidiaphragm as shown on CT scan of 11/14/2022. IMPRESSION: 1. Gastric tube tip in the gastric body. 2. Suspected mildly dilated small bowel loop in the central upper abdomen with some dilute contrast medium in this vicinity. 3. Mild atelectasis along the left hemidiaphragm. Electronically Signed   By: Gaylyn Rong M.D.   On: 11/14/2022 20:26   CT ABDOMEN PELVIS W CONTRAST  Result Date: 11/14/2022 CLINICAL DATA:  Acute, nonlocalized a no pain EXAM: CT ABDOMEN AND PELVIS WITH CONTRAST TECHNIQUE: Multidetector CT imaging of the abdomen and pelvis was performed using the standard protocol following bolus administration of intravenous contrast. RADIATION DOSE REDUCTION: This exam was performed according to the departmental dose-optimization program which includes automated exposure control, adjustment of the mA and/or kV according to patient size and/or use of iterative reconstruction technique. CONTRAST:  75mL OMNIPAQUE IOHEXOL 350 MG/ML SOLN COMPARISON:  08/14/2021 FINDINGS: Lower chest: Mild linear atelectasis at the lung bases with  some scarring. Hepatobiliary: No focal liver abnormality.Biliary dilatation post cholecystectomy, possibly reservoir effect given stability, CBD measuring up to 14 mm. No calcified choledocholithiasis. Chart  history of EUS. Pancreas: Generalized atrophy Spleen: Unremarkable. Adrenals/Urinary Tract: Negative adrenals. No hydronephrosis or stone. Unremarkable bladder. Stomach/Bowel: Dilated and fluid-filled small bowel with fecalized material before a abrupt transition in the left paramedian abdomen where there is angulated bowel, the segment measures up to 3.6 cm in diameter and is associated with mild mesenteric stranding. 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 Distal colonic diverticulosis. Vascular/Lymphatic: No acute vascular abnormality. No mass or adenopathy. Reproductive:Hysterectomy Other: No ascites or pneumoperitoneum. Musculoskeletal: No acute abnormalities. IMPRESSION: 1. Small bowel obstruction in the left mid abdomen where there is angulation of bowel loops. 2. Chronic biliary dilatation post cholecystectomy. Electronically Signed   By: Tiburcio Pea M.D.   On: 11/14/2022 04:17   DG Abdomen 1 View  Result Date: 11/13/2022 CLINICAL DATA:  Abdomen pain EXAM: ABDOMEN - 1 VIEW COMPARISON:  CT 08/14/2021 FINDINGS: Postsurgical changes in the right mid quadrant. Nonobstructed gas pattern with mild stool burden. No radiopaque calculi IMPRESSION: Nonobstructed gas pattern. Electronically Signed   By: Jasmine Pang M.D.   On: 11/13/2022 21:54    Anti-infectives: Anti-infectives (From admission, onward)    None       Assessment/Plan: SBO, likely due to adhesions in the setting of multiple abdominal surgeries.  - Afebrile, VSS, WBC 11.8 - no peritonitis or signs of intestinal ischemia, no role for emergent surgery - NGT was placed due to some emesis, but since then she has been passing gas and contrast made its way to the colon.      FEN - will try clears with NGT clamped.  NGT to go back to suction if she develops n/v/distention.   VTE - SCD's, lovenox ID - none indicated Admit - [facility] service       LOS: 1 day   Maudry Diego,  MD, FACS, FSSO Surgical Oncology, General Surgery, Trauma and Critical [facility], [place] [phone] for weekday/non holidays Check [email or web address] for coverage night/weekend/holidays

## 2022-11-16 DIAGNOSIS — K56609 Unspecified intestinal obstruction, unspecified as to partial versus complete obstruction: Secondary | ICD-10-CM | POA: Diagnosis not present

## 2022-11-16 LAB — CBC
HCT: 39.4 % (ref 36.0–46.0)
Hemoglobin: 12.3 g/dL (ref 12.0–15.0)
MCH: 27.1 pg (ref 26.0–34.0)
MCHC: 31.2 g/dL (ref 30.0–36.0)
MCV: 86.8 fL (ref 80.0–100.0)
Platelets: 188 10*3/uL (ref 150–400)
RBC: 4.54 MIL/uL (ref 3.87–5.11)
RDW: 13.2 % (ref 11.5–15.5)
WBC: 8.3 10*3/uL (ref 4.0–10.5)
nRBC: 0 % (ref 0.0–0.2)

## 2022-11-16 LAB — BASIC METABOLIC PANEL
Anion gap: 12 (ref 5–15)
BUN: 15 mg/dL (ref 8–23)
CO2: 24 mmol/L (ref 22–32)
Calcium: 8.8 mg/dL — ABNORMAL LOW (ref 8.9–10.3)
Chloride: 104 mmol/L (ref 98–111)
Creatinine, Ser: 1.05 mg/dL — ABNORMAL HIGH (ref 0.44–1.00)
GFR, Estimated: 58 mL/min — ABNORMAL LOW (ref >60)
Glucose, Bld: 166 mg/dL — ABNORMAL HIGH (ref 70–99)
Potassium: 3.7 mmol/L (ref 3.5–5.1)
Sodium: 140 mmol/L (ref 135–145)

## 2022-11-16 LAB — GLUCOSE, CAPILLARY
Glucose-Capillary: 159 mg/dL — ABNORMAL HIGH (ref 70–99)
Glucose-Capillary: 215 mg/dL — ABNORMAL HIGH (ref 70–99)
Glucose-Capillary: 193 mg/dL — ABNORMAL HIGH (ref 70–99)
Glucose-Capillary: 192 mg/dL — ABNORMAL HIGH (ref 70–99)

## 2022-11-16 LAB — MAGNESIUM: Magnesium: 2.2 mg/dL (ref 1.7–2.4)

## 2022-11-16 MED ORDER — SIMETHICONE 80 MG PO CHEW
80.0000 mg | CHEWABLE_TABLET | Freq: Four times a day (QID) | ORAL | Status: DC | PRN
  Administered 2022-11-16: 80 mg via ORAL
  Filled 2022-11-16: qty 1

## 2022-11-16 NOTE — Plan of Care (Signed)
  Problem: Education: Goal: Ability to describe self-care measures that may prevent or decrease complications (Diabetes Survival Skills Education) will improve Outcome: Progressing Goal: Individualized Educational Video(s) Outcome: Progressing   Problem: Coping: Goal: Ability to adjust to condition or change in health will improve Outcome: Progressing   Problem: Fluid Volume: Goal: Ability to maintain a balanced intake and output will improve Outcome: Progressing   Problem: Health Behavior/Discharge Planning: Goal: Ability to identify and utilize available resources and services will improve Outcome: Progressing Goal: Ability to manage health-related needs will improve Outcome: Progressing   Problem: Metabolic: Goal: Ability to maintain appropriate glucose levels will improve Outcome: Progressing   Problem: Nutritional: Goal: Maintenance of adequate nutrition will improve Outcome: Progressing Goal: Progress toward achieving an optimal weight will improve Outcome: Progressing   Problem: Skin Integrity: Goal: Risk for impaired skin integrity will decrease Outcome: Progressing   Problem: Tissue Perfusion: Goal: Adequacy of tissue perfusion will improve Outcome: Progressing   Problem: Education: Goal: Knowledge of General Education information will improve Description: Including pain rating scale, medication(s)/side effects and non-pharmacologic comfort measures Outcome: Progressing   Problem: Health Behavior/Discharge Planning: Goal: Ability to manage health-related needs will improve Outcome: Progressing   Problem: Clinical Measurements: Goal: Ability to maintain clinical measurements within normal limits will improve Outcome: Progressing Goal: Will remain free from infection Outcome: Progressing Goal: Diagnostic test results will improve Outcome: Progressing Goal: Respiratory complications will improve Outcome: Progressing Goal: Cardiovascular complication will  be avoided Outcome: Progressing   Problem: Activity: Goal: Risk for activity intolerance will decrease Outcome: Progressing   Problem: Nutrition: Goal: Adequate nutrition will be maintained Outcome: Progressing   Problem: Coping: Goal: Level of anxiety will decrease Outcome: Progressing   Problem: Elimination: Goal: Will not experience complications related to bowel motility Outcome: Progressing Goal: Will not experience complications related to urinary retention Outcome: Progressing   Problem: Pain Managment: Goal: General experience of comfort will improve Outcome: Progressing   Problem: Safety: Goal: Ability to remain free from injury will improve Outcome: Progressing   Problem: Skin Integrity: Goal: Risk for impaired skin integrity will decrease Outcome: Progressing    Tomisha Reppucci Tamera Stands, RN

## 2022-11-16 NOTE — Progress Notes (Signed)
PROGRESS NOTE    Courtney [name]  BMW:[id number] DOB: [birth year] DOA: 11/13/2022 PCP: Rema Fendt, NP     Brief Narrative:  67 year old with history of HTN, DM 2, CKD stage IIIa, diverticulitis, SBO with multiple abdominal surgeries in the past admitted for abdominal pain.  She has had prior abdominal surgeries including hernia repair in 90s and small bowel obstruction which [place] been [place], last 1 was ventral hernia repair with mesh in March 2024.  Upon admission she was diagnosed with small bowel obstruction secondary to adhesion.  General surgery was consulted.  Gastrografin/SBO protocol was initiated.  Initially refused NG tube but eventually placed due to worsening symptoms.  Eventually NG tube removed, started on oral diet.  Advanced slowly.     Assessment & Plan:  Principal Problem:   SBO (small bowel obstruction) (HCC) Active Problems:   H/O umbilical hernia repair with mesh placement 05/2022   Essential hypertension   CKD stage 3a, GFR 45-59 ml/min (HCC)   Insulin dependent type 2 diabetes mellitus (HCC)   Depression   AKI (acute kidney injury) (HCC)   Small bowel obstruction History of small bowel obstruction x 2 History of umbilical hernia repair with mesh placement in March 2024 Suspect this is secondary to adhesions.  SBO protocol with Gastrografin.  Management per gen Surg.  Advance diet as tolerated per surgery.   CKD stage 2 Renal function stable around 1.1   History of essential hypertension Currently no meds on hold.  IV as needed   Insulin-dependent DM type II -A1c 6.7 in April 2024.  Holding home medications.  Sliding scale and Accu-Cheks   Generalized anxiety disorder Chronic depression -Continue Xanax 0.5 mg 3 times daily as needed   Left Arm Cast -[facility] notified to take a look at this.  Discussed with Dr. Frazier Butt on 9/13 by me.        DVT prophylaxis: Subcu heparin Code Status: Full code Family Communication:   Status is:  Inpatient Remains inpatient appropriate because: Continue hospital stay until improvement in her small bowel obstruction             Subjective:  Still having some abdominal pain but passing gas.  Examination:  General exam: Appears calm and comfortable  Respiratory system: Clear to auscultation. Respiratory effort normal. Cardiovascular system: S1 & S2 heard, RRR. No JVD, murmurs, rubs, gallops or clicks. No pedal edema. Gastrointestinal system: Abdomen is nondistended, soft and nontender. No organomegaly or masses felt. Normal bowel sounds heard. Central nervous system: Alert and oriented. No focal neurological deficits. Extremities: Symmetric 5 x 5 power. Skin: No rashes, lesions or ulcers Psychiatry: Judgement and insight appear normal. Mood & affect appropriate.       Diet Orders (From admission, onward)     Start     Ordered   11/16/22 0914  Diet full liquid Room service appropriate? Yes; Fluid consistency: Thin  Diet effective now       Question Answer Comment  Room service appropriate? Yes   Fluid consistency: Thin      11/16/22 0913            Objective: Vitals:   11/15/22 2022 11/16/22 0411 11/16/22 0500 11/16/22 0808  BP: 130/78 120/76  129/81  Pulse: 81 79  72  Resp: 18 19  18   Temp: 98 F (36.7 C) 98.1 F (36.7 C)  98.8 F (37.1 C)  TempSrc: Oral Oral  Oral  SpO2: 95% (!) 16%  96%  Weight:   69.8 kg   Height:        Intake/Output Summary (Last 24 hours) at 11/16/2022 1109 Last data filed at 11/16/2022 1050 Gross per 24 hour  Intake 3 ml  Output --  Net 3 ml   Filed Weights   11/13/22 2015 11/15/22 0500 11/16/22 0500  Weight: 70.3 kg 68.9 kg 69.8 kg    Scheduled Meds:  heparin  5,000 Units Subcutaneous Q8H   influenza vaccine adjuvanted  0.5 mL Intramuscular Tomorrow-1000   insulin aspart  0-9 Units Subcutaneous Q6H   sodium chloride flush  3 mL Intravenous Q12H   sodium chloride flush  3 mL Intravenous Q12H   Continuous  Infusions:  sodium chloride      Nutritional status     Body mass index is 28.15 kg/m.  Data Reviewed:   CBC: Recent Labs  Lab 11/13/22 2026 11/15/22 0323 11/16/22 0310  WBC 7.9 11.8* 8.3  HGB 13.9 13.5 12.3  HCT 43.4 41.3 39.4  MCV 84.6 85.7 86.8  PLT 231 234 188   Basic Metabolic Panel: Recent Labs  Lab 11/13/22 2026 11/15/22 0323 11/16/22 0310  NA 142 141 140  K 4.5 4.1 3.7  CL 97* 105 104  CO2 26 21* 24  GLUCOSE 175* 183* 166*  BUN 14 14 15   CREATININE 1.16* 1.18* 1.05*  CALCIUM 9.9 9.1 8.8*  MG  --  2.4 2.2  PHOS  --  3.8  --    GFR: Estimated Creatinine Clearance: 47.6 mL/min (A) (by C-G formula based on SCr of 1.05 mg/dL (H)). Liver Function Tests: Recent Labs  Lab 11/13/22 2026 11/15/22 0323  AST 23 20  ALT 16 15  ALKPHOS 64 52  BILITOT 1.1 1.1  PROT 8.0 7.5  ALBUMIN 4.5 3.9   Recent Labs  Lab 11/13/22 2026  LIPASE 21   No results for input(s): "AMMONIA" in the last 168 hours. Coagulation Profile: No results for input(s): "INR", "PROTIME" in the last 168 hours. Cardiac Enzymes: No results for input(s): "CKTOTAL", "CKMB", "CKMBINDEX", "TROPONINI" in the last 168 hours. BNP (last 3 results) No results for input(s): "PROBNP" in the last 8760 hours. HbA1C: No results for input(s): "HGBA1C" in the last 72 hours. CBG: Recent Labs  Lab 11/15/22 1139 11/15/22 1817 11/15/22 2301 11/16/22 0410 11/16/22 1034  GLUCAP 233* 206* 164* 159* 215*   Lipid Profile: No results for input(s): "CHOL", "HDL", "LDLCALC", "TRIG", "CHOLHDL", "LDLDIRECT" in the last 72 hours. Thyroid Function Tests: No results for input(s): "TSH", "T4TOTAL", "FREET4", "T3FREE", "THYROIDAB" in the last 72 hours. Anemia Panel: Recent Labs    11/14/22 1026  VITAMINB12 356   Sepsis Labs: No results for input(s): "PROCALCITON", "LATICACIDVEN" in the last 168 hours.  Recent Results (from the past 240 hour(s))  Urine Culture     Status: None   Collection Time:  11/13/22  8:49 PM   Specimen: Urine, Clean Catch  Result Value Ref Range Status   Specimen Description URINE, CLEAN CATCH  Final   Special Requests NONE  Final   Culture   Final    NO GROWTH Performed at [facility], [address], [place], [place] [address]    Report Status 11/15/2022 FINAL  Final         Radiology Studies: DG Abd Portable 1V  Result Date: 11/15/2022 CLINICAL DATA:  Small-bowel obstruction protocol. EXAM: PORTABLE ABDOMEN - 1 VIEW COMPARISON:  11/14/2022 and 11/13/2022.  CT, 11/14/2022. FINDINGS: Central small bowel dilation is similar to  the previous exam. Contrast, however, has moved from the small bowel into the colon. No colonic dilation. Stable well-positioned nasogastric tube. IMPRESSION: 1. Persistent central small-bowel dilation, but passage of contrast into the colon. This supports small central small-bowel dilation as focal adynamic ileus versus obstruction. Electronically Signed   By: Amie Portland M.D.   On: 11/15/2022 10:18   DG Abd Portable 1V  Result Date: 11/14/2022 CLINICAL DATA:  Small-bowel obstruction, nasogastric tube placement. Periumbilical abdominal pain. EXAM: PORTABLE ABDOMEN - 1 VIEW COMPARISON:  11/13/2022 FINDINGS: In is a gastric tube is present with side port at the gastric cardia and tip in the gastric body. A small amount of contrast medium is present in the stomach, primarily along the fundus. Right upper quadrant clips from prior cholecystectomy. Suspected mildly dilated small bowel loop in the central upper abdomen with some dilute contrast medium in this vicinity. Moderate degenerative hip arthropathy bilaterally. Mild atelectasis along the left hemidiaphragm as shown on CT scan of 11/14/2022. IMPRESSION: 1. Gastric tube tip in the gastric body. 2. Suspected mildly dilated small bowel loop in the central upper abdomen with some dilute contrast medium in this vicinity. 3. Mild atelectasis along the left hemidiaphragm. Electronically  Signed   By: Gaylyn Rong M.D.   On: 11/14/2022 20:26           LOS: 2 days   Time spent= 35 mins    Miguel Rota, MD [facility]  If 7PM-7AM, please contact night-coverage  11/16/2022, 11:09 AM

## 2022-11-16 NOTE — Progress Notes (Signed)
Mobility Specialist: Progress Note   11/16/22 1646  Mobility  Activity Ambulated independently in hallway  Level of Assistance Independent  Assistive Device None  Distance Ambulated (ft) 700 ft  Activity Response Tolerated well  Mobility Referral Yes  $Mobility charge 1 Mobility  Mobility Specialist Start Time (ACUTE ONLY) 1406  Mobility Specialist Stop Time (ACUTE ONLY) 1422  Mobility Specialist Time Calculation (min) (ACUTE ONLY) 16 min    Received pt in bed having no complaints and agreeable to mobility. Pt was asymptomatic throughout ambulation and returned to room w/o fault. Left in bed w/ call bell in reach and all needs met.  Maurene Capes Mobility Specialist Please contact via SecureChat or Rehab office at [phone]

## 2022-11-16 NOTE — Progress Notes (Signed)
Patient ID: Barrett Shell, female   DOB: Dec 17, 1955, 67 y.o.   MRN: [id number] [facility] Surgery Progress Note     Subjective: CC-  NG out. Continues to have some abdominal pain, feels like gas pains. Passing a lot of flatus and having frequent loose stools. She reports having about 7 since midnight. No n/v. Tolerating clear liquids.  Objective: Vital signs in last 24 hours: Temp:  [98 F (36.7 C)-98.8 F (37.1 C)] 98.8 F (37.1 C) (09/15 0808) Pulse Rate:  [72-81] 72 (09/15 0808) Resp:  [14-19] 18 (09/15 0808) BP: (120-130)/(74-81) 129/81 (09/15 0808) SpO2:  [16 %-96 %] 96 % (09/15 0808) Weight:  [69.8 kg] 69.8 kg (09/15 0500) Last BM Date : 11/13/22  Intake/Output from previous day: No intake/output data recorded. Intake/Output this shift: No intake/output data recorded.  PE: Gen:  Alert, NAD, pleasant Abd: soft, nondistended, hyperactive bowel sounds, minimal central abdominal TTP without rebound or guarding  Lab Results:  Recent Labs    11/15/22 0323 11/16/22 0310  WBC 11.8* 8.3  HGB 13.5 12.3  HCT 41.3 39.4  PLT 234 188   BMET Recent Labs    11/15/22 0323 11/16/22 0310  NA 141 140  K 4.1 3.7  CL 105 104  CO2 21* 24  GLUCOSE 183* 166*  BUN 14 15  CREATININE 1.18* 1.05*  CALCIUM 9.1 8.8*   PT/INR No results for input(s): "LABPROT", "INR" in the last 72 hours. CMP     Component Value Date/Time   NA 140 11/16/2022 0310   NA 140 08/08/2022 0000   K 3.7 11/16/2022 0310   CL 104 11/16/2022 0310   CO2 24 11/16/2022 0310   GLUCOSE 166 (H) 11/16/2022 0310   BUN 15 11/16/2022 0310   BUN 17 08/08/2022 0000   CREATININE 1.05 (H) 11/16/2022 0310   CREATININE 1.15 (H) 11/27/2017 1457   CALCIUM 8.8 (L) 11/16/2022 0310   PROT 7.5 11/15/2022 0323   PROT 7.6 08/08/2022 0000   ALBUMIN 3.9 11/15/2022 0323   ALBUMIN 4.9 08/08/2022 0000   AST 20 11/15/2022 0323   ALT 15 11/15/2022 0323   ALKPHOS 52 11/15/2022 0323   BILITOT 1.1 11/15/2022 0323    BILITOT 0.3 08/08/2022 0000   GFRNONAA 58 (L) 11/16/2022 0310   GFRAA >60 04/01/2017 1716   Lipase     Component Value Date/Time   LIPASE 21 11/13/2022 2026       Studies/Results: DG Abd Portable 1V  Result Date: 11/15/2022 CLINICAL DATA:  Small-bowel obstruction protocol. EXAM: PORTABLE ABDOMEN - 1 VIEW COMPARISON:  11/14/2022 and 11/13/2022.  CT, 11/14/2022. FINDINGS: Central small bowel dilation is similar to the previous exam. Contrast, however, has moved from the small bowel into the colon. No colonic dilation. Stable well-positioned nasogastric tube. IMPRESSION: 1. Persistent central small-bowel dilation, but passage of contrast into the colon. This supports small central small-bowel dilation as focal adynamic ileus versus obstruction. Electronically Signed   By: Amie Portland M.D.   On: 11/15/2022 10:18   DG Abd Portable 1V  Result Date: 11/14/2022 CLINICAL DATA:  Small-bowel obstruction, nasogastric tube placement. Periumbilical abdominal pain. EXAM: PORTABLE ABDOMEN - 1 VIEW COMPARISON:  11/13/2022 FINDINGS: In is a gastric tube is present with side port at the gastric cardia and tip in the gastric body. A small amount of contrast medium is present in the stomach, primarily along the fundus. Right upper quadrant clips from prior cholecystectomy. Suspected mildly dilated small bowel loop in the central upper abdomen with  some dilute contrast medium in this vicinity. Moderate degenerative hip arthropathy bilaterally. Mild atelectasis along the left hemidiaphragm as shown on CT scan of 11/14/2022. IMPRESSION: 1. Gastric tube tip in the gastric body. 2. Suspected mildly dilated small bowel loop in the central upper abdomen with some dilute contrast medium in this vicinity. 3. Mild atelectasis along the left hemidiaphragm. Electronically Signed   By: Gaylyn Rong M.D.   On: 11/14/2022 20:26    Anti-infectives: Anti-infectives (From admission, onward)    None         Assessment/Plan SBO, likely due to adhesions in the setting of multiple abdominal surgeries.  - NG is out. Still having some abdominal pain but no n/v and having bowel function. Advance to full liquids. Mobilize.   FEN - FLD   VTE - SCD's, sq heparin ID - none indicated  I reviewed last 24 h vitals and pain scores, last 48 h intake and output, and last 24 h labs and trends.    LOS: 2 days    Franne Forts, [facility] Surgery 11/16/2022, 9:15 AM Please see Amion for pager number during day hours 7:00am-4:30pm

## 2022-11-17 DIAGNOSIS — K56609 Unspecified intestinal obstruction, unspecified as to partial versus complete obstruction: Secondary | ICD-10-CM | POA: Diagnosis not present

## 2022-11-17 DIAGNOSIS — S62355A Nondisplaced fracture of shaft of fourth metacarpal bone, left hand, initial encounter for closed fracture: Secondary | ICD-10-CM | POA: Diagnosis not present

## 2022-11-17 LAB — BASIC METABOLIC PANEL
Anion gap: 11 (ref 5–15)
BUN: 12 mg/dL (ref 8–23)
CO2: 22 mmol/L (ref 22–32)
Calcium: 8.4 mg/dL — ABNORMAL LOW (ref 8.9–10.3)
Chloride: 102 mmol/L (ref 98–111)
Creatinine, Ser: 0.89 mg/dL (ref 0.44–1.00)
GFR, Estimated: >60 mL/min (ref >60)
Glucose, Bld: 196 mg/dL — ABNORMAL HIGH (ref 70–99)
Potassium: 3.3 mmol/L — ABNORMAL LOW (ref 3.5–5.1)
Sodium: 135 mmol/L (ref 135–145)

## 2022-11-17 LAB — MAGNESIUM: Magnesium: 1.7 mg/dL (ref 1.7–2.4)

## 2022-11-17 LAB — CBC
HCT: 36 % (ref 36.0–46.0)
Hemoglobin: 12.2 g/dL (ref 12.0–15.0)
MCH: 28.4 pg (ref 26.0–34.0)
MCHC: 33.9 g/dL (ref 30.0–36.0)
MCV: 83.9 fL (ref 80.0–100.0)
Platelets: 167 10*3/uL (ref 150–400)
RBC: 4.29 MIL/uL (ref 3.87–5.11)
RDW: 12.9 % (ref 11.5–15.5)
WBC: 6.9 10*3/uL (ref 4.0–10.5)
nRBC: 0 % (ref 0.0–0.2)

## 2022-11-17 LAB — GLUCOSE, CAPILLARY
Glucose-Capillary: 175 mg/dL — ABNORMAL HIGH (ref 70–99)
Glucose-Capillary: 187 mg/dL — ABNORMAL HIGH (ref 70–99)

## 2022-11-17 MED ORDER — POTASSIUM CHLORIDE 20 MEQ PO PACK
40.0000 meq | PACK | ORAL | Status: DC
  Administered 2022-11-17: 40 meq via ORAL
  Filled 2022-11-17: qty 2

## 2022-11-17 NOTE — Discharge Summary (Signed)
Physician Discharge Summary  Courtney [name] XBM:[id number] DOB: [birth year] DOA: 11/13/2022  PCP: Rema Fendt, NP  Admit date: 11/13/2022 Discharge date: 11/17/2022  Admitted From: Home Disposition: Home  Recommendations for Outpatient Follow-up:  Follow up with PCP in 1-2 weeks Please obtain BMP/CBC in one week your next doctors visit.  Output and follow-up with [facility] for her left upper extremity cast Soft diet   Discharge Condition: Stable CODE STATUS: Full code Diet recommendation: Diabetic    Brief Narrative:  67 year old with history of HTN, DM 2, CKD stage IIIa, diverticulitis, SBO with multiple abdominal surgeries in the past admitted for abdominal pain.  She has had prior abdominal surgeries including hernia repair in 90s and small bowel obstruction which has been [place] of state, last 1 was ventral hernia repair with mesh in March 2024.  Upon admission she was diagnosed with small bowel obstruction secondary to adhesion.  General surgery was consulted.  Gastrografin/SBO protocol was initiated.  Initially refused NG tube but eventually placed due to worsening symptoms.  Eventually NG tube removed, started on oral diet.  Advanced slowly.  If tolerates lunch she will be discharged today.     Assessment & Plan:  Principal Problem:   SBO (small bowel obstruction) (HCC) Active Problems:   H/O umbilical hernia repair with mesh placement 05/2022   Essential hypertension   CKD stage 3a, GFR 45-59 ml/min (HCC)   Insulin dependent type 2 diabetes mellitus (HCC)   Depression   AKI (acute kidney injury) (HCC)   Small bowel obstruction History of small bowel obstruction x 2 History of umbilical hernia repair with mesh placement in March 2024 Suspect this is secondary to adhesions.  SBO protocol with Gastrografin.  Management per gen Surg.  NG tube removed.  If if to rates lunch she will be discharged later today.   CKD stage 2 Renal function stable around  1.1  Hypokalemia - As needed repletion.   History of essential hypertension Currently no meds on hold.  IV as needed   Insulin-dependent DM type II -A1c 6.7 in April 2024.  Holding home medications.  Sliding scale and Accu-Cheks   Generalized anxiety disorder Chronic depression -Continue Xanax 0.5 mg 3 times daily as needed   Left Arm Cast Discussed with Dr. Frazier Butt from [facility], recommends outpatient follow-up       DVT prophylaxis: Subcu heparin Code Status: Full code Family Communication:   Status is: Inpatient Remains inpatient appropriate because: Discharge today     Discharge Diagnoses:  Principal Problem:   SBO (small bowel obstruction) (HCC) Active Problems:   H/O umbilical hernia repair with mesh placement 05/2022   Essential hypertension   CKD stage 3a, GFR 45-59 ml/min (HCC)   Insulin dependent type 2 diabetes mellitus (HCC)   Depression   AKI (acute kidney injury) (HCC)      Consultations: General surgery  Subjective: No complaints feeling well.  If tolerates lunch she may be discharged today  Discharge Exam: Vitals:   11/16/22 2035 11/17/22 0502  BP: 130/75 135/74  Pulse: 76 62  Resp: 18 19  Temp: 99.2 F (37.3 C) 99 F (37.2 C)  SpO2: 97% 96%   Vitals:   11/16/22 1608 11/16/22 2035 11/17/22 0500 11/17/22 0502  BP: 128/76 130/75  135/74  Pulse: 60 76  62  Resp: 15 18  19   Temp: 98.5 F (36.9 C) 99.2 F (37.3 C)  99 F (37.2 C)  TempSrc: Oral Oral  Oral  SpO2: 98% 97%  96%  Weight:   69.7 kg   Height:        General: Pt is alert, awake, not in acute distress Cardiovascular: RRR, S1/S2 +, no rubs, no gallops Respiratory: CTA bilaterally, no wheezing, no rhonchi Abdominal: Soft, NT, ND, bowel sounds + Extremities: no edema, no cyanosis  Discharge Instructions   Allergies as of 11/17/2022       Reactions   Ibuprofen Other (See Comments)   Developed a ulcer    Methylprednisolone Anaphylaxis, Shortness Of Breath,  Palpitations   Naproxen Other (See Comments)   Developed a ulcer    Nsaids Other (See Comments)   Pt gets Ulcers    Prednisone Anaphylaxis, Shortness Of Breath, Palpitations   Bactrim [sulfamethoxazole-trimethoprim] Other (See Comments)   "blotchiness" redness, face swelling   Biaxin [clarithromycin] Swelling   Sulfa Antibiotics Swelling, Other (See Comments)   Angioedema   Ozempic (0.25 Or 0.5 Mg-dose) [semaglutide(0.25 Or 0.5mg -dos)] Rash        Medication List     TAKE these medications    acetaminophen 500 MG tablet Commonly known as: TYLENOL Take 1,000 mg by mouth every 8 (eight) hours as needed for headache.   ALPRAZolam 1 MG tablet Commonly known as: XANAX Take 1 mg by mouth 3 (three) times daily as needed for anxiety.   cyclobenzaprine 10 MG tablet Commonly known as: FLEXERIL Take 10 mg by mouth 3 (three) times daily as needed for muscle spasms.   Dexcom G7 Sensor Misc 1 Device by Does not apply route as directed.   empagliflozin 25 MG Tabs tablet Commonly known as: Jardiance Take 1 tablet (25 mg total) by mouth daily.   fluconazole 100 MG tablet Commonly known as: DIFLUCAN Take 100 mg by mouth daily.   freestyle lancets 1 Device by Misc.(Non-Drug; Combo Route) route daily. USED TO CHECK BLOOD SUGAR TID   FREESTYLE LITE test strip Generic drug: glucose blood 1 each by Other route in the morning, at noon, in the evening, and at bedtime. Use as instructed   FreeStyle Lite w/Device Kit 1 Device by Does not apply route daily in the afternoon.   Insulin Pen Needle 32G X 4 MM Misc 1 Device by Does not apply route in the morning, at noon, in the evening, and at bedtime.   Lantus SoloStar 100 UNIT/ML Solostar Pen Generic drug: insulin glargine Inject 12 Units into the skin daily.   latanoprost 0.005 % ophthalmic solution Commonly known as: XALATAN Place 1 drop into both eyes at bedtime.   loratadine 10 MG tablet Commonly known as: CLARITIN Take 10 mg  by mouth daily as needed for allergies.   modafinil 100 MG tablet Commonly known as: PROVIGIL Take 25 mg by mouth daily as needed (Takes to help stay awake due to insomnia per patient).   mupirocin ointment 2 % Commonly known as: BACTROBAN Apply 1 Application topically 2 (two) times daily. What changed:  when to take this reasons to take this   naloxone 4 MG/0.1ML Liqd nasal spray kit Commonly known as: NARCAN Place 1 spray into the nose daily as needed (overdose).   NovoLOG FlexPen 100 UNIT/ML FlexPen Generic drug: insulin aspart Max daily 30 units What changed:  how much to take how to take this when to take this additional instructions   ondansetron 4 MG tablet Commonly known as: ZOFRAN Take 1 tablet (4 mg total) by mouth every 6 (six) hours as needed for nausea or vomiting. Prior to colonoscopy prep   oxyCODONE 15 MG  immediate release tablet Commonly known as: ROXICODONE Take 15 mg by mouth every 6 (six) hours as needed for pain.   pantoprazole 40 MG tablet Commonly known as: PROTONIX Take 1 tablet (40 mg total) by mouth daily.   terconazole 0.8 % vaginal cream Commonly known as: TERAZOL 3 Place 1 applicator vaginally at bedtime.   Vitamin D (Ergocalciferol) 1.25 MG (50000 UNIT) Caps capsule Commonly known as: DRISDOL Take 50,000 Units by mouth once a week.        Follow-up Information     Rema Fendt, NP Follow up in 1 week(s).   Specialty: Nurse Practitioner Contact information: [address] [place] [address] [phone]                Allergies  Allergen Reactions   Ibuprofen Other (See Comments)    Developed a ulcer     Methylprednisolone Anaphylaxis, Shortness Of Breath and Palpitations   Naproxen Other (See Comments)    Developed a ulcer    Nsaids Other (See Comments)    Pt gets Ulcers    Prednisone Anaphylaxis, Shortness Of Breath and Palpitations   Bactrim [Sulfamethoxazole-Trimethoprim] Other (See  Comments)    "blotchiness" redness, face swelling   Biaxin [Clarithromycin] Swelling   Sulfa Antibiotics Swelling and Other (See Comments)    Angioedema    Ozempic (0.25 Or 0.5 Mg-Dose) [Semaglutide(0.25 Or 0.5mg -Dos)] Rash    You were cared for by a hospitalist during your hospital stay. If you have any questions about your discharge medications or the care you received while you were in the hospital after you are discharged, you can call the unit and asked to speak with the hospitalist on call if the hospitalist that took care of you is not available. Once you are discharged, your primary care physician will handle any further medical issues. Please note that no refills for any discharge medications will be authorized once you are discharged, as it is imperative that you return to your primary care physician (or establish a relationship with a primary care physician if you do not have one) for your aftercare needs so that they can reassess your need for medications and monitor your lab values.  You were cared for by a hospitalist during your hospital stay. If you have any questions about your discharge medications or the care you received while you were in the hospital after you are discharged, you can call the unit and asked to speak with the hospitalist on call if the hospitalist that took care of you is not available. Once you are discharged, your primary care physician will handle any further medical issues. Please note that NO REFILLS for any discharge medications will be authorized once you are discharged, as it is imperative that you return to your primary care physician (or establish a relationship with a primary care physician if you do not have one) for your aftercare needs so that they can reassess your need for medications and monitor your lab values.  Please request your Prim.MD to go over all Hospital Tests and Procedure/Radiological results at the follow up, please get all Hospital  records sent to your Prim MD by signing hospital release before you go home.  Get CBC, CMP, 2 view Chest X ray checked  by Primary MD during your next visit or SNF MD in 5-7 days ( we routinely change or add medications that can affect your baseline labs and fluid status, therefore we recommend that you get the mentioned basic  workup next visit with your PCP, your PCP may decide not to get them or add new tests based on their clinical decision)  On your next visit with your primary care physician please Get Medicines reviewed and adjusted.  If you experience worsening of your admission symptoms, develop shortness of breath, life threatening emergency, suicidal or homicidal thoughts you must seek medical attention immediately by calling 911 or calling your MD immediately  if symptoms less severe.  You Must read complete instructions/literature along with all the possible adverse reactions/side effects for all the Medicines you take and that have been prescribed to you. Take any new Medicines after you have completely understood and accpet all the possible adverse reactions/side effects.   Do not drive, operate heavy machinery, perform activities at heights, swimming or participation in water activities or provide baby sitting services if your were admitted for syncope or siezures until you have seen by Primary MD or a Neurologist and advised to do so again.  Do not drive when taking Pain medications.   Procedures/Studies: DG Abd Portable 1V  Result Date: 11/15/2022 CLINICAL DATA:  Small-bowel obstruction protocol. EXAM: PORTABLE ABDOMEN - 1 VIEW COMPARISON:  11/14/2022 and 11/13/2022.  CT, 11/14/2022. FINDINGS: Central small bowel dilation is similar to the previous exam. Contrast, however, has moved from the small bowel into the colon. No colonic dilation. Stable well-positioned nasogastric tube. IMPRESSION: 1. Persistent central small-bowel dilation, but passage of contrast into the colon. This  supports small central small-bowel dilation as focal adynamic ileus versus obstruction. Electronically Signed   By: Amie Portland M.D.   On: 11/15/2022 10:18   DG Abd Portable 1V  Result Date: 11/14/2022 CLINICAL DATA:  Small-bowel obstruction, nasogastric tube placement. Periumbilical abdominal pain. EXAM: PORTABLE ABDOMEN - 1 VIEW COMPARISON:  11/13/2022 FINDINGS: In is a gastric tube is present with side port at the gastric cardia and tip in the gastric body. A small amount of contrast medium is present in the stomach, primarily along the fundus. Right upper quadrant clips from prior cholecystectomy. Suspected mildly dilated small bowel loop in the central upper abdomen with some dilute contrast medium in this vicinity. Moderate degenerative hip arthropathy bilaterally. Mild atelectasis along the left hemidiaphragm as shown on CT scan of 11/14/2022. IMPRESSION: 1. Gastric tube tip in the gastric body. 2. Suspected mildly dilated small bowel loop in the central upper abdomen with some dilute contrast medium in this vicinity. 3. Mild atelectasis along the left hemidiaphragm. Electronically Signed   By: Gaylyn Rong M.D.   On: 11/14/2022 20:26   CT ABDOMEN PELVIS W CONTRAST  Result Date: 11/14/2022 CLINICAL DATA:  Acute, nonlocalized a no pain EXAM: CT ABDOMEN AND PELVIS WITH CONTRAST TECHNIQUE: Multidetector CT imaging of the abdomen and pelvis was performed using the standard protocol following bolus administration of intravenous contrast. RADIATION DOSE REDUCTION: This exam was performed according to the departmental dose-optimization program which includes automated exposure control, adjustment of the mA and/or kV according to patient size and/or use of iterative reconstruction technique. CONTRAST:  75mL OMNIPAQUE IOHEXOL 350 MG/ML SOLN COMPARISON:  08/14/2021 FINDINGS: Lower chest: Mild linear atelectasis at the lung bases with some scarring. Hepatobiliary: No focal liver abnormality.Biliary  dilatation post cholecystectomy, possibly reservoir effect given stability, CBD measuring up to 14 mm. No calcified choledocholithiasis. Chart history of EUS. Pancreas: Generalized atrophy Spleen: Unremarkable. Adrenals/Urinary Tract: Negative adrenals. No hydronephrosis or stone. Unremarkable bladder. Stomach/Bowel: Dilated and fluid-filled small bowel with fecalized material before a abrupt transition in the left  paramedian abdomen where there is angulated bowel, the segment measures up to 3.6 cm in diameter and is associated with mild mesenteric stranding. 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 Distal colonic diverticulosis. Vascular/Lymphatic: No acute vascular abnormality. No mass or adenopathy. Reproductive:Hysterectomy Other: No ascites or pneumoperitoneum. Musculoskeletal: No acute abnormalities. IMPRESSION: 1. Small bowel obstruction in the left mid abdomen where there is angulation of bowel loops. 2. Chronic biliary dilatation post cholecystectomy. Electronically Signed   By: Tiburcio Pea M.D.   On: 11/14/2022 04:17   DG Abdomen 1 View  Result Date: 11/13/2022 CLINICAL DATA:  Abdomen pain EXAM: ABDOMEN - 1 VIEW COMPARISON:  CT 08/14/2021 FINDINGS: Postsurgical changes in the right mid quadrant. Nonobstructed gas pattern with mild stool burden. No radiopaque calculi IMPRESSION: Nonobstructed gas pattern. Electronically Signed   By: Jasmine Pang M.D.   On: 11/13/2022 21:54   DG Hand Complete Left  Result Date: 10/24/2022 CLINICAL DATA:  Radio speaker fell on hand.  Left hand pain. EXAM: LEFT HAND - COMPLETE 3+ VIEW COMPARISON:  None Available. FINDINGS: Nondisplaced fracture is seen through the 4th metacarpal midshaft. No other fractures identified. No evidence of dislocation. IMPRESSION: Nondisplaced 4th metacarpal shaft fracture. Electronically Signed   By: Danae Orleans M.D.   On: 10/24/2022 19:00   DG Wrist Complete Left  Result  Date: 10/24/2022 CLINICAL DATA:  Radio speaker fell on wrist.  Left wrist pain. EXAM: LEFT WRIST - COMPLETE 3+ VIEW COMPARISON:  Left hand radiographs also obtained today FINDINGS: There is no evidence of fracture or dislocation. There is no evidence of arthropathy or other focal bone abnormality in the wrist. Nondisplaced fracture of the 4th metacarpal shaft is noted, as seen on separate hand radiographs obtained today. IMPRESSION: No evidence of left wrist fracture. Nondisplaced fracture of the 4th metacarpal shaft. Electronically Signed   By: Danae Orleans M.D.   On: 10/24/2022 18:59     The results of significant diagnostics from this hospitalization (including imaging, microbiology, ancillary and laboratory) are listed below for reference.     Microbiology: Recent Results (from the past 240 hour(s))  Urine Culture     Status: None   Collection Time: 11/13/22  8:49 PM   Specimen: Urine, Clean Catch  Result Value Ref Range Status   Specimen Description URINE, CLEAN CATCH  Final   Special Requests NONE  Final   Culture   Final    NO GROWTH Performed at [facility], [address] [place], [place] [address]    Report Status 11/15/2022 FINAL  Final     Labs: BNP (last 3 results) No results for input(s): "BNP" in the last 8760 hours. Basic Metabolic Panel: Recent Labs  Lab 11/13/22 2026 11/15/22 0323 11/16/22 0310 11/17/22 0332  NA 142 141 140 135  K 4.5 4.1 3.7 3.3*  CL 97* 105 104 102  CO2 26 21* 24 22  GLUCOSE 175* 183* 166* 196*  BUN 14 14 15 12   CREATININE 1.16* 1.18* 1.05* 0.89  CALCIUM 9.9 9.1 8.8* 8.4*  MG  --  2.4 2.2 1.7  PHOS  --  3.8  --   --    Liver Function Tests: Recent Labs  Lab 11/13/22 2026 11/15/22 0323  AST 23 20  ALT 16 15  ALKPHOS 64 52  BILITOT 1.1 1.1  PROT 8.0 7.5  ALBUMIN 4.5 3.9   Recent Labs  Lab 11/13/22 2026  LIPASE 21   No results  for input(s): "AMMONIA" in the last 168 hours. CBC: Recent Labs  Lab 11/13/22 2026  11/15/22 0323 11/16/22 0310 11/17/22 0332  WBC 7.9 11.8* 8.3 6.9  HGB 13.9 13.5 12.3 12.2  HCT 43.4 41.3 39.4 36.0  MCV 84.6 85.7 86.8 83.9  PLT 231 234 188 167   Cardiac Enzymes: No results for input(s): "CKTOTAL", "CKMB", "CKMBINDEX", "TROPONINI" in the last 168 hours. BNP: Invalid input(s): "POCBNP" CBG: Recent Labs  Lab 11/16/22 1034 11/16/22 1510 11/16/22 2039 11/17/22 0505 11/17/22 0833  GLUCAP 215* 193* 192* 175* 187*   D-Dimer No results for input(s): "DDIMER" in the last 72 hours. Hgb A1c No results for input(s): "HGBA1C" in the last 72 hours. Lipid Profile No results for input(s): "CHOL", "HDL", "LDLCALC", "TRIG", "CHOLHDL", "LDLDIRECT" in the last 72 hours. Thyroid function studies No results for input(s): "TSH", "T4TOTAL", "T3FREE", "THYROIDAB" in the last 72 hours.  Invalid input(s): "FREET3" Anemia work up No results for input(s): "VITAMINB12", "FOLATE", "FERRITIN", "TIBC", "IRON", "RETICCTPCT" in the last 72 hours. Urinalysis    Component Value Date/Time   COLORURINE YELLOW 11/13/2022 2049   APPEARANCEUR CLEAR 11/13/2022 2049   LABSPEC 1.029 11/13/2022 2049   PHURINE 5.0 11/13/2022 2049   GLUCOSEU >=500 (A) 11/13/2022 2049   HGBUR NEGATIVE 11/13/2022 2049   BILIRUBINUR NEGATIVE 11/13/2022 2049   BILIRUBINUR negative 08/08/2022 1629   KETONESUR 20 (A) 11/13/2022 2049   PROTEINUR NEGATIVE 11/13/2022 2049   UROBILINOGEN 0.2 08/08/2022 1629   NITRITE NEGATIVE 11/13/2022 2049   LEUKOCYTESUR NEGATIVE 11/13/2022 2049   Sepsis Labs Recent Labs  Lab 11/13/22 2026 11/15/22 0323 11/16/22 0310 11/17/22 0332  WBC 7.9 11.8* 8.3 6.9   Microbiology Recent Results (from the past 240 hour(s))  Urine Culture     Status: None   Collection Time: 11/13/22  8:49 PM   Specimen: Urine, Clean Catch  Result Value Ref Range Status   Specimen Description URINE, CLEAN CATCH  Final   Special Requests NONE  Final   Culture   Final    NO GROWTH Performed at [facility], [address], [place], [place] [address]    Report Status 11/15/2022 FINAL  Final     Time coordinating discharge:  I have spent 35 minutes face to face with the patient and on the ward discussing the patients care, assessment, plan and disposition with other care givers. >50% of the time was devoted counseling the patient about the risks and benefits of treatment/Discharge disposition and coordinating care.   SIGNED:   Miguel Rota, MD  [facility] 11/17/2022, 12:34 PM   If 7PM-7AM, please contact night-coverage

## 2022-11-17 NOTE — Care Management Important Message (Signed)
Important Message  Patient Details  Name: Courtney [name] MRN: [id number] Date of Birth: 1956/01/03   Medicare Important Message Given:  Yes     Sherilyn Banker 11/17/2022, 1:12 PM

## 2022-11-17 NOTE — Progress Notes (Signed)
Patient ID: Courtney [name], female   DOB: 04/25/1955, 67 y.o.   MRN: [id number] [facility] Surgery Progress Note     Subjective: Tolerating FLD and wants some crackers.  +BM x2.  No nausea  Objective: Vital signs in last 24 hours: Temp:  [98.5 F (36.9 C)-99.2 F (37.3 C)] 99 F (37.2 C) (09/16 0502) Pulse Rate:  [60-76] 62 (09/16 0502) Resp:  [15-19] 19 (09/16 0502) BP: (128-135)/(74-76) 135/74 (09/16 0502) SpO2:  [96 %-98 %] 96 % (09/16 0502) Weight:  [69.7 kg] 69.7 kg (09/16 0500) Last BM Date : 11/13/22  Intake/Output from previous day: 09/15 0701 - 09/16 0700 In: 703 [P.O.:700; I.V.:3] Out: -  Intake/Output this shift: No intake/output data recorded.  PE: Gen:  Alert, NAD, pleasant Abd: soft, nondistended, + bowel sounds, minimal central abdominal TTP without rebound or guarding  Lab Results:  Recent Labs    11/16/22 0310 11/17/22 0332  WBC 8.3 6.9  HGB 12.3 12.2  HCT 39.4 36.0  PLT 188 167   BMET Recent Labs    11/16/22 0310 11/17/22 0332  NA 140 135  K 3.7 3.3*  CL 104 102  CO2 24 22  GLUCOSE 166* 196*  BUN 15 12  CREATININE 1.05* 0.89  CALCIUM 8.8* 8.4*   PT/INR No results for input(s): "LABPROT", "INR" in the last 72 hours. CMP     Component Value Date/Time   NA 135 11/17/2022 0332   NA 140 08/08/2022 0000   K 3.3 (L) 11/17/2022 0332   CL 102 11/17/2022 0332   CO2 22 11/17/2022 0332   GLUCOSE 196 (H) 11/17/2022 0332   BUN 12 11/17/2022 0332   BUN 17 08/08/2022 0000   CREATININE 0.89 11/17/2022 0332   CREATININE 1.15 (H) 11/27/2017 1457   CALCIUM 8.4 (L) 11/17/2022 0332   PROT 7.5 11/15/2022 0323   PROT 7.6 08/08/2022 0000   ALBUMIN 3.9 11/15/2022 0323   ALBUMIN 4.9 08/08/2022 0000   AST 20 11/15/2022 0323   ALT 15 11/15/2022 0323   ALKPHOS 52 11/15/2022 0323   BILITOT 1.1 11/15/2022 0323   BILITOT 0.3 08/08/2022 0000   GFRNONAA >60 11/17/2022 0332   GFRAA >60 04/01/2017 1716   Lipase     Component Value Date/Time    LIPASE 21 11/13/2022 2026       Studies/Results: No results found.  Anti-infectives: Anti-infectives (From admission, onward)    None        Assessment/Plan SBO, likely due to adhesions in the setting of multiple abdominal surgeries.  - tolerating FLD -adv to soft diet, if she tolerates this she is surgically stable for DC home when felt to be medically stable    FEN - soft  VTE - SCD's, sq heparin ID - none indicated  I reviewed hospitalist notes, last 24 h vitals and pain scores, last 48 h intake and output, and last 24 h labs and trends.    LOS: 3 days    [name], [facility] Surgery 11/17/2022, 10:39 AM Please see Amion for pager number during day hours 7:00am-4:30pm

## 2022-11-19 ENCOUNTER — Telehealth: Payer: Self-pay

## 2022-11-19 NOTE — Transitions of Care (Post Inpatient/ED Visit) (Signed)
11/19/2022  Name: Courtney [name] MRN: [id number] DOB: 1955/03/09  Today's TOC FU Call Status: Today's TOC FU Call Status:: Unsuccessful Call (1st Attempt) Unsuccessful Call (1st Attempt) Date: 11/19/22  Attempted to reach the patient regarding the most recent Inpatient/ED visit.  Follow Up Plan: Additional outreach attempts will be made to reach the patient to complete the Transitions of Care (Post Inpatient/ED visit) call.     Antionette Fairy, RN,BSN,CCM [facility] Care Management Care Management Community Coordinator Direct Phone: [phone] Toll Free: [phone] Fax: [phone]

## 2022-11-20 ENCOUNTER — Telehealth: Payer: Self-pay

## 2022-11-20 NOTE — Transitions of Care (Post Inpatient/ED Visit) (Signed)
11/20/2022  Name: Courtney [name] MRN: [id number] DOB: 1955-04-03  Today's TOC FU Call Status: Today's TOC FU Call Status:: Successful TOC FU Call Completed TOC FU Call Complete Date: 11/20/22 Patient's Name and Date of Birth confirmed.  Transition Care Management Follow-up Telephone Call Date of Discharge: 11/17/22 Discharge Facility: [facility]) Type of Discharge: Inpatient Admission Primary Inpatient Discharge Diagnosis:: "SBO" How have you been since you were released from the hospital?: Same (Pt states "very slow process-still having some abd pain"-current pain 71/10-took Oxycodone last at 7am. Appetite decreased. LBM Tues-took some Miralax & stool softener-drinking prune juice) Any questions or concerns?: No  Items Reviewed: Did you receive and understand the discharge instructions provided?: Yes Medications obtained,verified, and reconciled?: Yes (Medications Reviewed) (pt voices that she does not want to resume her regular/daily meds until her appetite increases and she sees MD) Any new allergies since your discharge?: No Dietary orders reviewed?: Yes Type of Diet Ordered:: low salt/heart healthy/carb modified Do you have support at home?: Yes People in Home: spouse Name of Support/Comfort Primary Source: Jimmie  Medications Reviewed Today: Medications Reviewed Today     Reviewed by Charlyn Minerva, RN (Registered Nurse) on 11/20/22 at 1452  Med List Status: <None>   Medication Order Taking? Sig Documenting Provider Last Dose Status Informant  acetaminophen (TYLENOL) 500 MG tablet [id number] Yes Take 1,000 mg by mouth every 8 (eight) hours as needed for headache. [provider] Taking Active Self  ALPRAZolam Prudy Feeler) 1 MG tablet [id number] Yes Take 1 mg by mouth 3 (three) times daily as needed for anxiety. [provider] Taking Active Self  Blood Glucose Monitoring Suppl (FREESTYLE LITE) w/Device KIT [id number] Yes 1 Device by Does not  apply route daily in the afternoon. Shamleffer, Konrad Dolores, MD Taking Active Self  Continuous Blood Gluc Sensor (DEXCOM G7 SENSOR) MISC [id number] Yes 1 Device by Does not apply route as directed. Shamleffer, Konrad Dolores, MD Taking Active Self  cyclobenzaprine (FLEXERIL) 10 MG tablet [id number] No Take 10 mg by mouth 3 (three) times daily as needed for muscle spasms.  Patient not taking: Reported on 11/20/2022   [provider] Not Taking Active Self  empagliflozin (JARDIANCE) 25 MG TABS tablet [id number] Yes Take 1 tablet (25 mg total) by mouth daily. Shamleffer, Konrad Dolores, MD Taking Active Self  fluconazole (DIFLUCAN) 100 MG tablet [id number] No Take 100 mg by mouth daily.  Patient not taking: Reported on 11/20/2022   [provider] Not Taking Active Self           Med Note (SATTERFIELD, Genoveva Ill   Sat Nov 15, 2022  8:41 PM) Patient states she is taking every day   glucose blood (FREESTYLE LITE) test strip [id number] Yes 1 each by Other route in the morning, at noon, in the evening, and at bedtime. Use as instructed Shamleffer, Konrad Dolores, MD Taking Active Self  insulin aspart (NOVOLOG FLEXPEN) 100 UNIT/ML FlexPen [id number] Yes Max daily 30 units  Patient taking differently: Inject 6-10 Units into the skin 3 (three) times daily with meals.   Shamleffer, Konrad Dolores, MD Taking Active Self  insulin glargine (LANTUS SOLOSTAR) 100 UNIT/ML Solostar Pen [id number] Yes Inject 12 Units into the skin daily. Shamleffer, Konrad Dolores, MD Taking Active Self  Insulin Pen Needle 32G X 4 MM MISC [id number] Yes 1 Device by Does not apply route in the morning, at noon, in the evening, and at bedtime. Shamleffer, Konrad Dolores, MD Taking Active Self  Lancets (FREESTYLE)  lancets [id number] Yes 1 Device by Misc.(Non-Drug; Combo Route) route daily. USED TO CHECK BLOOD SUGAR TID Shamleffer, Konrad Dolores, MD Taking Active Self  latanoprost (XALATAN) 0.005 % ophthalmic solution  [id number] Yes Place 1 drop into both eyes at bedtime. [provider] Taking Active Self  loratadine (CLARITIN) 10 MG tablet [id number] No Take 10 mg by mouth daily as needed for allergies.  Patient not taking: Reported on 11/20/2022   [provider] Not Taking Active Self  modafinil (PROVIGIL) 100 MG tablet [id number] No Take 25 mg by mouth daily as needed (Takes to help stay awake due to insomnia per patient).  Patient not taking: Reported on 11/20/2022   [provider] Not Taking Active Self  mupirocin ointment (BACTROBAN) 2 % [id number] No Apply 1 Application topically 2 (two) times daily.  Patient not taking: Reported on 11/20/2022   Rema Fendt, NP Not Taking Active Self  naloxone [facility]) nasal spray 4 mg/0.1 mL [id number]  Place 1 spray into the nose daily as needed (overdose). [provider]  Active Self           Med Note Jola Schmidt   Fri Nov 14, 2022  1:21 PM)    ondansetron (ZOFRAN) 4 MG tablet [id number] No Take 1 tablet (4 mg total) by mouth every 6 (six) hours as needed for nausea or vomiting. Prior to colonoscopy prep  Patient not taking: Reported on 11/20/2022   Fritzi Mandes, MD Not Taking Active Self  oxyCODONE (ROXICODONE) 15 MG immediate release tablet [id number] Yes Take 15 mg by mouth every 6 (six) hours as needed for pain. [provider] Taking Active Self  pantoprazole (PROTONIX) 40 MG tablet [id number] Yes Take 1 tablet (40 mg total) by mouth daily. Esterwood, Amy S, PA-C Taking Active Self  polyethylene glycol (MIRALAX) 17 g packet [id number] Yes Take 17 g by mouth daily. [provider] Taking Active Self  Sennosides-Docusate Sodium (STOOL SOFTENER & LAXATIVE PO) [id number] Yes Take 1 tablet by mouth daily as needed (constipation). [provider] Taking Active Self    Discontinued 02/29/20 1307   terconazole (TERAZOL 3) 0.8 % vaginal cream [id number] No Place 1 applicator vaginally at bedtime.   Patient not taking: Reported on 11/20/2022   Rema Fendt, NP Not Taking Active Self  Vitamin D, Ergocalciferol, (DRISDOL) 1.25 MG (50000 UNIT) CAPS capsule [id number] Yes Take 50,000 Units by mouth once a week. [provider] Taking Active Self           Med Note (SATTERFIELD, Genoveva Ill   Sat Nov 15, 2022  8:25 PM) Take on Mondays             Home Care and Equipment/Supplies: Were Home Health Services Ordered?: NA Any new equipment or medical supplies ordered?: NA  Functional Questionnaire: Do you need assistance with bathing/showering or dressing?: No Do you need assistance with meal preparation?: No Do you need assistance with eating?: No Do you have difficulty maintaining continence: No Do you need assistance with getting out of bed/getting out of a chair/moving?: No Do you have difficulty managing or taking your medications?: No  Follow up appointments reviewed: PCP Follow-up appointment confirmed?: Yes Date of PCP follow-up appointment?: 11/26/22 Follow-up Provider: Dr. Andrey Campanile [facility] Follow-up appointment confirmed?: Yes Date of Specialist follow-up appointment?: 12/09/22 Follow-Up Specialty Provider:: Dr. Anne Shutter, ortho MD-12/08/22 Do you need transportation to your follow-up appointment?: No (pt confirms spouse will take her to f/u appts) Do you understand care options  if your condition(s) worsen?: Yes-patient verbalized understanding  SDOH Interventions Today    Flowsheet Row Most Recent Value  SDOH Interventions   Food Insecurity Interventions Intervention Not Indicated      Interventions Today    Flowsheet Row Most Recent Value  General Interventions   General Interventions Discussed/Reviewed General Interventions Discussed, Doctor Visits, Durable Medical Equipment (DME)  Doctor Visits Discussed/Reviewed Doctor Visits Discussed, PCP, Specialist  Durable Medical Equipment (DME) Glucomoter  [pt states she took her sensor off last  night as it was showing inaccurate cbg reading-has not placed a new one on yet-instructed to apply new sensor-also finger prick meter and states she will try using that as well]  PCP/Specialist Visits Compliance with follow-up visit  Education Interventions   Education Provided Provided Education  Provided Verbal Education On Nutrition, When to see the doctor  Nutrition Interventions   Nutrition Discussed/Reviewed Nutrition Discussed  Pharmacy Interventions   Pharmacy Dicussed/Reviewed Pharmacy Topics Discussed, Medications and their functions  Safety Interventions   Safety Discussed/Reviewed Safety Discussed      TOC Interventions Today    Flowsheet Row Most Recent Value  TOC Interventions   TOC Interventions Discussed/Reviewed TOC Interventions Discussed       Alessandra Grout [facility] Care Management Care Management Community Coordinator Direct Phone: [phone] Toll Free: [phone] Fax: [phone]

## 2022-11-24 DIAGNOSIS — Z79899 Other long term (current) drug therapy: Secondary | ICD-10-CM | POA: Diagnosis not present

## 2022-11-24 DIAGNOSIS — T402X5A Adverse effect of other opioids, initial encounter: Secondary | ICD-10-CM | POA: Diagnosis not present

## 2022-11-24 DIAGNOSIS — E663 Overweight: Secondary | ICD-10-CM | POA: Diagnosis not present

## 2022-11-24 DIAGNOSIS — M79604 Pain in right leg: Secondary | ICD-10-CM | POA: Diagnosis not present

## 2022-11-24 DIAGNOSIS — N1831 Chronic kidney disease, stage 3a: Secondary | ICD-10-CM | POA: Diagnosis not present

## 2022-11-24 DIAGNOSIS — Z23 Encounter for immunization: Secondary | ICD-10-CM | POA: Diagnosis not present

## 2022-11-24 DIAGNOSIS — M545 Low back pain, unspecified: Secondary | ICD-10-CM | POA: Diagnosis not present

## 2022-11-24 DIAGNOSIS — F3342 Major depressive disorder, recurrent, in full remission: Secondary | ICD-10-CM | POA: Diagnosis not present

## 2022-11-24 DIAGNOSIS — R768 Other specified abnormal immunological findings in serum: Secondary | ICD-10-CM | POA: Diagnosis not present

## 2022-11-24 DIAGNOSIS — Z9181 History of falling: Secondary | ICD-10-CM | POA: Diagnosis not present

## 2022-11-24 DIAGNOSIS — M79605 Pain in left leg: Secondary | ICD-10-CM | POA: Diagnosis not present

## 2022-11-24 DIAGNOSIS — E1140 Type 2 diabetes mellitus with diabetic neuropathy, unspecified: Secondary | ICD-10-CM | POA: Diagnosis not present

## 2022-11-26 ENCOUNTER — Ambulatory Visit (INDEPENDENT_AMBULATORY_CARE_PROVIDER_SITE_OTHER): Payer: Medicare Other | Admitting: Family Medicine

## 2022-11-26 ENCOUNTER — Encounter: Payer: Self-pay | Admitting: Family Medicine

## 2022-11-26 VITALS — BP 138/74 | HR 76 | Resp 20 | Wt 159.0 lb

## 2022-11-26 DIAGNOSIS — E119 Type 2 diabetes mellitus without complications: Secondary | ICD-10-CM | POA: Diagnosis not present

## 2022-11-26 DIAGNOSIS — Z794 Long term (current) use of insulin: Secondary | ICD-10-CM

## 2022-11-26 DIAGNOSIS — Z79899 Other long term (current) drug therapy: Secondary | ICD-10-CM | POA: Diagnosis not present

## 2022-11-26 DIAGNOSIS — Z09 Encounter for follow-up examination after completed treatment for conditions other than malignant neoplasm: Secondary | ICD-10-CM

## 2022-11-26 DIAGNOSIS — Z8719 Personal history of other diseases of the digestive system: Secondary | ICD-10-CM

## 2022-11-26 NOTE — Progress Notes (Signed)
Concerns regarding hospital visit- SBO  Diet

## 2022-11-26 NOTE — Progress Notes (Signed)
Established Patient Office Visit  Subjective    Patient ID: Courtney [name], female    DOB: April 28, 1955  Age: 67 y.o. MRN: [id number]  CC:  Chief Complaint  Patient presents with   Hospitalization Follow-up    HPI Courtney [name] presents for follow up after hosptal discharge where she was admitted after SBO. She reports improvement.   Outpatient Encounter Medications as of 11/26/2022  Medication Sig   acetaminophen (TYLENOL) 500 MG tablet Take 1,000 mg by mouth every 8 (eight) hours as needed for headache.   ALPRAZolam (XANAX) 1 MG tablet Take 1 mg by mouth 3 (three) times daily as needed for anxiety.   Continuous Blood Gluc Sensor (DEXCOM G7 SENSOR) MISC 1 Device by Does not apply route as directed.   cyclobenzaprine (FLEXERIL) 10 MG tablet Take 10 mg by mouth 3 (three) times daily as needed for muscle spasms.   empagliflozin (JARDIANCE) 25 MG TABS tablet Take 1 tablet (25 mg total) by mouth daily.   insulin aspart (NOVOLOG FLEXPEN) 100 UNIT/ML FlexPen Max daily 30 units (Patient taking differently: Inject 6-10 Units into the skin 3 (three) times daily with meals.)   insulin glargine (LANTUS SOLOSTAR) 100 UNIT/ML Solostar Pen Inject 12 Units into the skin daily.   latanoprost (XALATAN) 0.005 % ophthalmic solution Place 1 drop into both eyes at bedtime.   Blood Glucose Monitoring Suppl (FREESTYLE LITE) w/Device KIT 1 Device by Does not apply route daily in the afternoon.   fluconazole (DIFLUCAN) 100 MG tablet Take 100 mg by mouth daily. (Patient not taking: Reported on 11/20/2022)   glucose blood (FREESTYLE LITE) test strip 1 each by Other route in the morning, at noon, in the evening, and at bedtime. Use as instructed   Insulin Pen Needle 32G X 4 MM MISC 1 Device by Does not apply route in the morning, at noon, in the evening, and at bedtime. (Patient not taking: Reported on 11/26/2022)   Lancets (FREESTYLE) lancets 1 Device by Misc.(Non-Drug; Combo Route) route daily. USED TO  CHECK BLOOD SUGAR TID   loratadine (CLARITIN) 10 MG tablet Take 10 mg by mouth daily as needed for allergies. (Patient not taking: Reported on 11/20/2022)   modafinil (PROVIGIL) 100 MG tablet Take 25 mg by mouth daily as needed (Takes to help stay awake due to insomnia per patient). (Patient not taking: Reported on 11/20/2022)   mupirocin ointment (BACTROBAN) 2 % Apply 1 Application topically 2 (two) times daily. (Patient not taking: Reported on 11/20/2022)   naloxone [facility]) nasal spray 4 mg/0.1 mL Place 1 spray into the nose daily as needed (overdose). (Patient not taking: Reported on 11/26/2022)   ondansetron (ZOFRAN) 4 MG tablet Take 1 tablet (4 mg total) by mouth every 6 (six) hours as needed for nausea or vomiting. Prior to colonoscopy prep (Patient not taking: Reported on 11/20/2022)   oxyCODONE (ROXICODONE) 15 MG immediate release tablet Take 15 mg by mouth every 6 (six) hours as needed for pain. (Patient not taking: Reported on 11/26/2022)   pantoprazole (PROTONIX) 40 MG tablet Take 1 tablet (40 mg total) by mouth daily. (Patient not taking: Reported on 11/26/2022)   polyethylene glycol (MIRALAX) 17 g packet Take 17 g by mouth daily. (Patient not taking: Reported on 11/26/2022)   Vitamin D, Ergocalciferol, (DRISDOL) 1.25 MG (50000 UNIT) CAPS capsule Take 50,000 Units by mouth once a week.   [DISCONTINUED] Sennosides-Docusate Sodium (STOOL SOFTENER & LAXATIVE PO) Take 1 tablet by mouth daily as needed (constipation). (Patient  not taking: Reported on 11/26/2022)   [DISCONTINUED] sucralfate (CARAFATE) 1 g tablet Take 1 tablet (1 g total) by mouth 4 (four) times daily -  before meals and at bedtime. Dissolve 1 tablet in 2-4 oz of water, drink as slurry   [DISCONTINUED] terconazole (TERAZOL 3) 0.8 % vaginal cream Place 1 applicator vaginally at bedtime. (Patient not taking: Reported on 11/20/2022)   No facility-administered encounter medications on file as of 11/26/2022.    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   Bowel obstruction (HCC)    Depression with anxiety 02/09/2015   mild xanax prn   Diabetes mellitus (HCC) 07/18/2015   type 2   Diverticulitis    Dysrhythmia    abnormal ekgs worked up   Essential tremor 03/31/2016   Fibromyalgia    MS   Gait difficulty 02/09/2015   Gallstones    GERD (gastroesophageal reflux disease)    HLD (hyperlipidemia)    Hypersomnia 11/09/2015   Hypertension    no meds   Hyperthyroidism    patient states has no problems at present-01/25/2019   Hypothyroidism    patient states no problems at present 01/25/2019   IBS (irritable bowel syndrome)    Insomnia 02/09/2015   Migraines    MS (multiple sclerosis) (HCC)    Multiple joint pain 03/31/2016   Numbness in both hands 03/18/2016   Other fatigue 02/09/2015   Peptic ulcer    Pneumonia    multiple times. Last time 2014   PONV (postoperative nausea and vomiting)    Urinary hesitancy 02/09/2015   Vision abnormalities     Past Surgical History:  Procedure Laterality Date   ABDOMINAL HYSTERECTOMY  1976   including oophorectomy, due to uterine cancer   APPENDECTOMY  1974   BIOPSY  01/31/2019   Procedure: BIOPSY;  Surgeon: Napoleon Form, MD;  Location: [facility];  Service: Endoscopy;;   CARDIAC CATHETERIZATION     2001/2006 In [place]. Normal per patient   CARPAL TUNNEL RELEASE Left    carpal tunnel surgery   CHOLECYSTECTOMY     2006   COLON SURGERY     SBO x 2   COLONOSCOPY WITH PROPOFOL N/A 01/31/2019   Procedure: COLONOSCOPY WITH PROPOFOL;  Surgeon: Napoleon Form, MD;  Location: [facility];  Service: Endoscopy;  Laterality: N/A;   COLONOSCOPY WITH PROPOFOL N/A 04/24/2022   Procedure: COLONOSCOPY WITH PROPOFOL;  Surgeon: Napoleon Form, MD;  Location: [facility];  Service: Gastroenterology;  Laterality:  N/A;   EUS N/A 04/09/2017   Procedure: UPPER ENDOSCOPIC ULTRASOUND (EUS) LINEAR;  Surgeon: Rachael Fee, MD;  Location: [facility];  Service: Endoscopy;  Laterality: N/A;   HERNIA REPAIR     INCISIONAL HERNIA REPAIR N/A 05/05/2022   Procedure: LAPAROSCOPIC VENTRAL HERNA REPAIR WITH MESH;  Surgeon: Fritzi Mandes, MD;  Location: [facility] OR;  Service: General;  Laterality: N/A;   INSERTION OF MESH N/A 05/05/2022   Procedure: INSERTION OF MESH;  Surgeon: Fritzi Mandes, MD;  [facility] OR;  Service: General;  Laterality: N/A;   MENISCUS REPAIR Left    POLYPECTOMY  01/31/2019   Procedure: POLYPECTOMY;  Surgeon: Napoleon Form, MD;  Location: [facility];  Service: Endoscopy;;   POLYPECTOMY  04/24/2022   Procedure: POLYPECTOMY;  Surgeon: Napoleon Form, MD;  Location:  [facility];  Service: Gastroenterology;;   SMALL INTESTINE SURGERY     toenail removal Right 12/2017   great toenail     Family History  Problem Relation Age of Onset   Heart disease Mother    Kidney disease Mother    Diabetes Mother    Other Father 71       murdered   Multiple sclerosis Daughter    Multiple sclerosis Other    Diabetes Sister    Alcohol abuse Brother        ETOH and marijuana   Diabetes Brother    Diabetes Sister    Diabetes Brother    ALS Brother     Social History   Socioeconomic History   Marital status: Married    Spouse name: Jimmie   Number of children: 4   Years of education: Not on file   Highest education level: 12th grade  Occupational History   Occupation: disabled  Tobacco Use   Smoking status: Former    Current packs/day: 0.00    Average packs/day: 0.3 packs/day for 5.0 years (1.3 ttl pk-yrs)    Types: Cigarettes    Start date: 33    Quit date: 1990    Years since quitting: 34.7    Passive exposure: Never   Smokeless tobacco: Never  Vaping Use   Vaping status: Never Used  Substance and Sexual Activity   Alcohol use: Not Currently    Comment: rarely    Drug use: Yes    Types: Oxycodone   Sexual activity: Yes  Other Topics Concern   Not on file  Social History Narrative   Pt is right-handed. She lives with her husband in a 2 story house. Pt avoids caffeine. She and her husband go to the gym, lately once weekly, previously 3 times a week.    Social Determinants of Health   Financial Resource Strain: Low Risk  (10/08/2022)   Overall Financial Resource Strain (CARDIA)    Difficulty of Paying Living Expenses: Not very hard  Food Insecurity: No Food Insecurity (11/20/2022)   Hunger Vital Sign    Worried About Running Out of Food in the Last Year: Never true    Ran Out of Food in the Last Year: Never true  Transportation Needs: No Transportation Needs (11/20/2022)   PRAPARE - Administrator, Civil Service (Medical): No    Lack of Transportation (Non-Medical): No  Physical Activity: Insufficiently Active (10/08/2022)   Exercise Vital Sign    Days of Exercise per Week: 2 days    Minutes of Exercise per Session: 20 min  Stress: Stress Concern Present (10/08/2022)   [employer] of Occupational Health - Occupational Stress Questionnaire    Feeling of Stress : To some extent  Social Connections: Moderately Integrated (10/08/2022)   Social Connection and Isolation Panel [NHANES]    Frequency of Communication with Friends and Family: Three times a week    Frequency of Social Gatherings with Friends and Family: Patient declined    Attends Religious Services: More than 4 times per year    Active Member of [employer] or Organizations: No    Attends Banker Meetings: Patient declined    Marital Status: Married  Catering manager Violence: Not At Risk (11/14/2022)   Humiliation, Afraid, Rape, and Kick questionnaire    Fear of Current or Ex-Partner: No    Emotionally Abused: No    Physically Abused: No    Sexually Abused: No    Review of  Systems  All other systems reviewed and are negative.       Objective    BP 138/74  (BP Location: Right Arm, Patient Position: Sitting, Cuff Size: Normal)   Pulse 76   Resp 20   Wt 159 lb (72.1 kg)   SpO2 95%   BMI 29.08 kg/m   Physical Exam Vitals and nursing note reviewed.  Constitutional:      General: She is not in acute distress. Cardiovascular:     Rate and Rhythm: Normal rate and regular rhythm.  Pulmonary:     Effort: Pulmonary effort is normal.     Breath sounds: Normal breath sounds.  Abdominal:     Palpations: Abdomen is soft.     Tenderness: There is no abdominal tenderness.  Neurological:     General: No focal deficit present.     Mental Status: She is alert and oriented to person, place, and time.         Assessment & Plan:   1. Hospital discharge follow-up Improved. Referred for recommended follow up labs.  2. Hx SBO As above.  - Basic Metabolic Panel - CBC with Differential  3. Insulin dependent type 2 diabetes mellitus (HCC) Continue. Referral to [facility] for med management.   4. Encounter for long-term (current) use of insulin (HCC)      No follow-ups on file.   Tommie Raymond, MD

## 2022-11-27 LAB — CBC WITH DIFFERENTIAL/PLATELET
Basophils Absolute: 0 10*3/uL (ref 0.0–0.2)
Basos: 0 %
EOS (ABSOLUTE): 0 10*3/uL (ref 0.0–0.4)
Eos: 0 %
Hematocrit: 40.8 % (ref 34.0–46.6)
Hemoglobin: 12.6 g/dL (ref 11.1–15.9)
Immature Grans (Abs): 0 10*3/uL (ref 0.0–0.1)
Immature Granulocytes: 0 %
Lymphocytes Absolute: 1.8 10*3/uL (ref 0.7–3.1)
Lymphs: 23 %
MCH: 27.8 pg (ref 26.6–33.0)
MCHC: 30.9 g/dL — ABNORMAL LOW (ref 31.5–35.7)
MCV: 90 fL (ref 79–97)
Monocytes Absolute: 0.6 10*3/uL (ref 0.1–0.9)
Monocytes: 8 %
Neutrophils Absolute: 5.2 10*3/uL (ref 1.4–7.0)
Neutrophils: 69 %
Platelets: 167 10*3/uL (ref 150–450)
RBC: 4.54 x10E6/uL (ref 3.77–5.28)
RDW: 14.2 % (ref 11.7–15.4)
WBC: 7.6 10*3/uL (ref 3.4–10.8)

## 2022-11-27 LAB — BASIC METABOLIC PANEL
BUN/Creatinine Ratio: 14 (ref 12–28)
BUN: 20 mg/dL (ref 8–27)
CO2: 25 mmol/L (ref 20–29)
Calcium: 9.7 mg/dL (ref 8.7–10.3)
Chloride: 101 mmol/L (ref 96–106)
Creatinine, Ser: 1.47 mg/dL — ABNORMAL HIGH (ref 0.57–1.00)
Glucose: 106 mg/dL — ABNORMAL HIGH (ref 70–99)
Potassium: 4.9 mmol/L (ref 3.5–5.2)
Sodium: 142 mmol/L (ref 134–144)
eGFR: 39 mL/min/{1.73_m2} — ABNORMAL LOW (ref >59)

## 2022-12-03 DIAGNOSIS — F330 Major depressive disorder, recurrent, mild: Secondary | ICD-10-CM | POA: Diagnosis not present

## 2022-12-03 DIAGNOSIS — F411 Generalized anxiety disorder: Secondary | ICD-10-CM | POA: Diagnosis not present

## 2022-12-03 DIAGNOSIS — F4321 Adjustment disorder with depressed mood: Secondary | ICD-10-CM | POA: Diagnosis not present

## 2022-12-05 DIAGNOSIS — Z1231 Encounter for screening mammogram for malignant neoplasm of breast: Secondary | ICD-10-CM | POA: Diagnosis not present

## 2022-12-08 DIAGNOSIS — S62355A Nondisplaced fracture of shaft of fourth metacarpal bone, left hand, initial encounter for closed fracture: Secondary | ICD-10-CM | POA: Diagnosis not present

## 2022-12-09 ENCOUNTER — Ambulatory Visit: Payer: Medicare Other | Admitting: Internal Medicine

## 2022-12-09 NOTE — Progress Notes (Deleted)
Name: Courtney [name]  Age/ Sex: 67 y.o., female   MRN/ DOB: [id number], [birth year]     PCP: Rema Fendt, NP   Reason for Endocrinology Evaluation: Type 2 Diabetes Mellitus  Initial Endocrine Consultative Visit: 06/20/2020    PATIENT IDENTIFIER: Courtney [name] is a 67 y.o. female with a past medical history of T2DM, MS , HTN and essential tremors. The patient has followed with Endocrinology clinic since 06/20/20 for consultative assistance with management of her diabetes.  DIABETIC HISTORY:  Courtney [name] was diagnosed with DM in 2008, Victoza caused nausea, Bydureon . Has been on insulin since 2011. Her hemoglobin A1c has ranged from 7.3% in 2017, peaking at 8.0% in years ago.   On her initial visit to our clinic she had an A1c of 6.4% , she was on Jardiance and novolog mix . She was having recurrent hypoglycemia at the time, we reduced insulin and continued Jardiance    Started Trulicity 08/2021 was stopped by 12/2021 due to complaints of loss of appetite and borborygmi  but opted to go back 04/2022  Ozempic caused pruritus but this continued despite being off Ozempic for over 6 weeks, stopped Jardiance 06/2022 to see if this would help with pruritus     SUBJECTIVE:   During the last visit (06/06/2022): A1c 7.2%        Today (06/19/2022): Courtney [name] is here for a follow up on diabetes management.  She checks her blood sugars multiple times daily through CGM.  The patient was noted with hypoglycemia, she is symptomatic with the episode.  She is s/p Hernia repair 05/2022 She was hospitalized for small bowel obstruction 11/13/2022 She follows with behavioral health for generalized anxiety  HOME DIABETES REGIMEN:  Jardiance 25 mg daily  Lantus 12 units daily NovoLog 10 units 3 times daily before every meal CF: NovoLog (BG -130/40) 3 times daily before every meal   Statin: no ACE-I/ARB: no Prior Diabetic Education: no   CONTINUOUS GLUCOSE MONITORING  RECORD INTERPRETATION    Dates of Recording: 4/5-4/18/2024  Sensor description:dexcom  Results statistics:   CGM use % of time 50  Average and SD 154/45  Time in range      77  %  % Time Above 180 19  % Time above 250 3  % Time Below target <1   Glycemic patterns summary: BG's are optimal at night, trend up during the day   Hyperglycemic episodes  postprandial   Hypoglycemic episodes occurred during the day and night without a pattern   Overnight periods:  mostly within range       DIABETIC COMPLICATIONS: Microvascular complications:  Neuropathy Denies: retinopathy, CKD Last Eye Exam: Completed 02/05/2021  Macrovascular complications:   Denies: CAD, CVA, PVD   HISTORY:  Past Medical History:  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   Bowel obstruction (HCC)    Depression with anxiety 02/09/2015   mild xanax prn   Diabetes mellitus (HCC) 07/18/2015   type 2   Diverticulitis    Dysrhythmia    abnormal ekgs worked up   Essential tremor 03/31/2016   Fibromyalgia    MS   Gait difficulty 02/09/2015   Gallstones    GERD (gastroesophageal reflux disease)    HLD (hyperlipidemia)    Hypersomnia 11/09/2015  Hypertension    no meds   Hyperthyroidism    patient states has no problems at present-01/25/2019   Hypothyroidism    patient states no problems at present 01/25/2019   IBS (irritable bowel syndrome)    Insomnia 02/09/2015   Migraines    MS (multiple sclerosis) (HCC)    Multiple joint pain 03/31/2016   Numbness in both hands 03/18/2016   Other fatigue 02/09/2015   Peptic ulcer    Pneumonia    multiple times. Last time 2014   PONV (postoperative nausea and vomiting)    Urinary hesitancy 02/09/2015   Vision abnormalities    Past Surgical History:  Past Surgical History:  Procedure Laterality Date    ABDOMINAL HYSTERECTOMY  1976   including oophorectomy, due to uterine cancer   APPENDECTOMY  1974   BIOPSY  01/31/2019   Procedure: BIOPSY;  Surgeon: Napoleon Form, MD;  Location: [facility];  Service: Endoscopy;;   CARDIAC CATHETERIZATION     2001/2006 In [place]. Normal per patient   CARPAL TUNNEL RELEASE Left    carpal tunnel surgery   CHOLECYSTECTOMY     2006   COLON SURGERY     S[facility] x 2   COLONOSCOPY WITH PROPOFOL N/A 01/31/2019   Procedure: COLONOSCOPY WITH PROPOFOL;  Surgeon: Napoleon Form, MD;  Location: [facility];  Service: Endoscopy;  Laterality: N/A;   COLONOSCOPY WITH PROPOFOL N/A 04/24/2022   Procedure: COLONOSCOPY WITH PROPOFOL;  Surgeon: Napoleon Form, MD;  Location: [facility];  Service: Gastroenterology;  Laterality: N/A;   EUS N/A 04/09/2017   Procedure: UPPER ENDOSCOPIC ULTRASOUND (EUS) LINEAR;  Surgeon: Rachael Fee, MD;  Location: [facility];  Service: Endoscopy;  Laterality: N/A;   HERNIA REPAIR     INCISIONAL HERNIA REPAIR N/A 05/05/2022   Procedure: LAPAROSCOPIC VENTRAL HERNA REPAIR WITH MESH;  Surgeon: Fritzi Mandes, MD;  Location: [facility] OR;  Service: General;  Laterality: N/A;   INSERTION OF MESH N/A 05/05/2022   Procedure: INSERTION OF MESH;  Surgeon: Fritzi Mandes, MD;  Location: [facility] OR;  Service: General;  Laterality: N/A;   MENISCUS REPAIR Left    POLYPECTOMY  01/31/2019   Procedure: POLYPECTOMY;  Surgeon: Napoleon Form, MD;  Location: [facility];  Service: Endoscopy;;   POLYPECTOMY  04/24/2022   Procedure: POLYPECTOMY;  Surgeon: Napoleon Form, MD;  Location: [facility];  Service: Gastroenterology;;   SMALL INTESTINE SURGERY     toenail removal Right 12/2017   great toenail    Social History:  reports that she quit smoking about 34 years ago. Her smoking use included cigarettes. She started smoking about 39 years ago. She has a 1.3 pack-year smoking history. She has never been exposed to tobacco smoke. She has  never used smokeless tobacco. She reports that she does not currently use alcohol. She reports current drug use. Drug: Oxycodone. Family History:  Family History  Problem Relation Age of Onset   Heart disease Mother    Kidney disease Mother    Diabetes Mother    Other Father 57       murdered   Multiple sclerosis Daughter    Multiple sclerosis Other    Diabetes Sister    Alcohol abuse Brother        ETOH and marijuana   Diabetes Brother    Diabetes Sister    Diabetes Brother    ALS Brother      HOME MEDICATIONS: Allergies as of 12/09/2022  Reactions   Ibuprofen Other (See Comments)   Developed a ulcer    Methylprednisolone Anaphylaxis, Shortness Of Breath, Palpitations   Naproxen Other (See Comments)   Developed a ulcer    Nsaids Other (See Comments)   Pt gets Ulcers    Prednisone Anaphylaxis, Shortness Of Breath, Palpitations   Bactrim [sulfamethoxazole-trimethoprim] Other (See Comments)   "blotchiness" redness, face swelling   Biaxin [clarithromycin] Swelling   Sulfa Antibiotics Swelling, Other (See Comments)   Angioedema   Ozempic (0.25 Or 0.5 Mg-dose) [semaglutide(0.25 Or 0.5mg -dos)] Rash        Medication List        Accurate as of December 09, 2022 12:52 PM. If you have any questions, ask your nurse or doctor.          acetaminophen 500 MG tablet Commonly known as: TYLENOL Take 1,000 mg by mouth every 8 (eight) hours as needed for headache.   ALPRAZolam 1 MG tablet Commonly known as: XANAX Take 1 mg by mouth 3 (three) times daily as needed for anxiety.   cyclobenzaprine 10 MG tablet Commonly known as: FLEXERIL Take 10 mg by mouth 3 (three) times daily as needed for muscle spasms.   Dexcom G7 Sensor Misc 1 Device by Does not apply route as directed.   empagliflozin 25 MG Tabs tablet Commonly known as: Jardiance Take 1 tablet (25 mg total) by mouth daily.   fluconazole 100 MG tablet Commonly known as: DIFLUCAN Take 100 mg by mouth  daily.   freestyle lancets 1 Device by Misc.(Non-Drug; Combo Route) route daily. USED TO CHECK BLOOD SUGAR TID   FREESTYLE LITE test strip Generic drug: glucose blood 1 each by Other route in the morning, at noon, in the evening, and at bedtime. Use as instructed   FreeStyle Lite w/Device Kit 1 Device by Does not apply route daily in the afternoon.   Insulin Pen Needle 32G X 4 MM Misc 1 Device by Does not apply route in the morning, at noon, in the evening, and at bedtime.   Lantus SoloStar 100 UNIT/ML Solostar Pen Generic drug: insulin glargine Inject 12 Units into the skin daily.   latanoprost 0.005 % ophthalmic solution Commonly known as: XALATAN Place 1 drop into both eyes at bedtime.   loratadine 10 MG tablet Commonly known as: CLARITIN Take 10 mg by mouth daily as needed for allergies.   MiraLax 17 g packet Generic drug: polyethylene glycol Take 17 g by mouth daily.   modafinil 100 MG tablet Commonly known as: PROVIGIL Take 25 mg by mouth daily as needed (Takes to help stay awake due to insomnia per patient).   mupirocin ointment 2 % Commonly known as: BACTROBAN Apply 1 Application topically 2 (two) times daily.   naloxone 4 MG/0.1ML Liqd nasal spray kit Commonly known as: NARCAN Place 1 spray into the nose daily as needed (overdose).   NovoLOG FlexPen 100 UNIT/ML FlexPen Generic drug: insulin aspart Max daily 30 units What changed:  how much to take how to take this when to take this additional instructions   ondansetron 4 MG tablet Commonly known as: ZOFRAN Take 1 tablet (4 mg total) by mouth every 6 (six) hours as needed for nausea or vomiting. Prior to colonoscopy prep   oxyCODONE 15 MG immediate release tablet Commonly known as: ROXICODONE Take 15 mg by mouth every 6 (six) hours as needed for pain.   pantoprazole 40 MG tablet Commonly known as: PROTONIX Take 1 tablet (40 mg total) by mouth daily.  Vitamin D (Ergocalciferol) 1.25 MG (50000  UNIT) Caps capsule Commonly known as: DRISDOL Take 50,000 Units by mouth once a week.         OBJECTIVE:   Vital Signs: There were no vitals taken for this visit.  Wt Readings from Last 3 Encounters:  11/26/22 159 lb (72.1 kg)  11/17/22 153 lb 10.6 oz (69.7 kg)  10/08/22 157 lb 12.8 oz (71.6 kg)     Exam: General: Pt appears well and is in NAD  Chest: CTA  Heart: RRR  Extremities: No pretibial edema.   Neuro: MS is good with appropriate affect, pt is alert and Ox3   DM Foot Exam 06/06/2022   The skin of the feet is intact without sores or ulcerations. The pedal pulses are 2+ on right and 2+ on left. The sensation is intact to a screening 5.07, 10 gram monofilament bilaterally    DATA REVIEWED:  Lab Results  Component Value Date   HGBA1C 6.7 (A) 06/19/2022   HGBA1C 7.2 (A) 04/08/2022   HGBA1C 7.2 (A) 12/05/2021    Latest Reference Range & Units 11/26/22 11:37  Sodium 134 - 144 mmol/L 142  Potassium 3.5 - 5.2 mmol/L 4.9  Chloride 96 - 106 mmol/L 101  CO2 20 - 29 mmol/L 25  Glucose 70 - 99 mg/dL 366 (H)  BUN 8 - 27 mg/dL 20  Creatinine 4.40 - 3.47 mg/dL 4.25 (H)  Calcium 8.7 - 10.3 mg/dL 9.7  BUN/Creatinine Ratio 12 - 28  14  eGFR >59 mL/min/1.73 39 (L)  (H): Data is abnormally high (L): Data is abnormally low  ASSESSMENT / PLAN / RECOMMENDATIONS:   1) Type 2 Diabetes Mellitus, Optimally controlled, With neuropathic and CKD III complications - Most recent A1c of 6.7 %. Goal A1c < 7.0 %.     -She was on Trulicity at some point, and she endorsed nonspecific symptoms to it, in hindsight she believes this had to do with hernia rather than the actual Trulicity  -She has been having difficulty obtaining the Dexcom through her insurance.  [employer] indicated that they need her to be trained on CGM before they approve it?,  Which was done on June 11, 2022.  [employer], denied the Dexcom again, stating that it MUST  be documented, that the patient would  agree to wear the CGM, even though it has been documented in her note that the patient would like the Dexcom and has been wearing it for at least the past 2 weeks to sample supply  MEDICATIONS: - Continue Jardiance 25 mg 1 tablet daily  - Stop NovoLog Mix - Start Lantus 12 units daily -Start NovoLog 10 units 3 times daily before every meal - Start CF: NovoLog (BG -130/40) 3 times daily before every meal      EDUCATION / INSTRUCTIONS: BG monitoring instructions: Patient is instructed to check her blood sugars 2 times a day, breakfast and supper. Call [place] [facility] if: BG persistently < 70  I reviewed the Rule of 15 for the treatment of hypoglycemia in detail with the patient. Literature supplied.   2) Diabetic complications:  Eye: Does not have known diabetic retinopathy.  Neuro/ Feet: Does  have known diabetic peripheral neuropathy .  Renal: Patient does not have known baseline CKD. She   is not on an ACEI/ARB at present.     3) Lipids: Patient is has been off statins due to severe cramps.      Signed electronically by: Lyndle Herrlich,  MD  [facility] End[facility]  [facility] [address] [place], [place] [address] Phone: [phone] FAX: [phone]   CC: Rema Fendt, NP [address] [place] [address] Phone: [phone]  Fax: [phone]  Return to Endocrinology clinic as below: Future Appointments  Date Time Provider Department Center  12/09/2022  1:20 PM Demitria Hay, Konrad Dolores, MD [facility] None  01/08/2023  3:00 PM Arnaldo Natal, NP [facility]

## 2022-12-12 ENCOUNTER — Ambulatory Visit: Payer: Medicare Other | Admitting: Pharmacist

## 2022-12-12 ENCOUNTER — Ambulatory Visit: Payer: Medicare Other | Admitting: Internal Medicine

## 2022-12-13 ENCOUNTER — Encounter: Payer: Self-pay | Admitting: Internal Medicine

## 2022-12-15 ENCOUNTER — Encounter: Payer: Self-pay | Admitting: Internal Medicine

## 2022-12-15 ENCOUNTER — Telehealth (INDEPENDENT_AMBULATORY_CARE_PROVIDER_SITE_OTHER): Payer: Medicare Other | Admitting: Internal Medicine

## 2022-12-15 ENCOUNTER — Telehealth: Payer: Self-pay | Admitting: Internal Medicine

## 2022-12-15 DIAGNOSIS — N1831 Chronic kidney disease, stage 3a: Secondary | ICD-10-CM

## 2022-12-15 DIAGNOSIS — E1142 Type 2 diabetes mellitus with diabetic polyneuropathy: Secondary | ICD-10-CM | POA: Diagnosis not present

## 2022-12-15 DIAGNOSIS — E1122 Type 2 diabetes mellitus with diabetic chronic kidney disease: Secondary | ICD-10-CM | POA: Diagnosis not present

## 2022-12-15 DIAGNOSIS — Z794 Long term (current) use of insulin: Secondary | ICD-10-CM

## 2022-12-15 NOTE — Telephone Encounter (Signed)
Please schedule the patient for follow-up with me in 6 months    Thanks

## 2022-12-15 NOTE — Progress Notes (Signed)
Virtual Visit via Video Note  I connected with Courtney [name] on  12/15/2022 at 9:50 AM  by a video enabled telemedicine application and verified that I am speaking with the correct person using two identifiers.   I discussed the limitations of evaluation and management by telemedicine and the availability of in person appointments. The patient expressed understanding and agreed to proceed.   -Location of the patient :home  -Location of the provider : Office -The names of all persons participating in the telemedicine service : Pt and myself        Name: Courtney [name]  Age/ Sex: 67 y.o., female   MRN/ DOB: [id number], 1955/08/14     PCP: Rema Fendt, NP   Reason for Endocrinology Evaluation: Type 2 Diabetes Mellitus  Initial Endocrine Consultative Visit: 06/20/2020    PATIENT IDENTIFIER: Courtney [name] is a 67 y.o. female with a past medical history of T2DM, MS , HTN and essential tremors. The patient has followed with Endocrinology clinic since 06/20/20 for consultative assistance with management of her diabetes.  DIABETIC HISTORY:  Courtney [name] was diagnosed with DM in 2008, Victoza caused nausea, Bydureon . Has been on insulin since 2011. Her hemoglobin A1c has ranged from 7.3% in 2017, peaking at 8.0% in years ago.   On her initial visit to our clinic she had an A1c of 6.4% , she was on Jardiance and novolog mix . She was having recurrent hypoglycemia at the time, we reduced insulin and continued Jardiance    Started Trulicity 08/2021 was stopped by 12/2021 due to complaints of loss of appetite and borborygmi  but opted to go back 04/2022  Ozempic caused pruritus but this continued despite being off Ozempic for over 6 weeks, stopped Jardiance 06/2022 to see if this would help with pruritus     SUBJECTIVE:   During the last visit (06/06/2022): A1c 7.2%        Today (06/19/2022): Courtney [name] is here for a follow up on diabetes management.   She checks her blood sugars multiple times daily through CGM.  The patient has not been noted with hypoglycemia, but she does endorse inaccurate readings on the Dexcom compared to fingerstick.  She is s/p Hernia repair 05/2022 She was hospitalized for small bowel obstruction 11/13/2022, she continues with abdominal pain and decreased appetite but no nausea or vomiting, she does have constipation She does tend to get hungry at night, she continues to be confused about what works best for her as far as meals, she did meet with our CDE  She follows with behavioral health for generalized anxiety    HOME DIABETES REGIMEN:  Jardiance 25 mg daily  Lantus 12 units daily NovoLog 10/6/8 units  CF: NovoLog (BG -130/40) 3 times daily before every meal   Statin: no ACE-I/ARB: no Prior Diabetic Education: no   CONTINUOUS GLUCOSE MONITORING RECORD INTERPRETATION    Dates of Recording: 10/1 - 12/15/2022  Sensor description:dexcom  Results statistics:   CGM use % of time 86  Average and SD 160/32  Time in range 74 %  % Time Above 180 26  % Time above 250 0  % Time Below target 0   Glycemic patterns summary: BGs are optimal through the night and most of the day  Hyperglycemic episodes  postprandial   Hypoglycemic episodes occurred N/A in the past 2 weeks  Overnight periods: Optimal      DIABETIC COMPLICATIONS: Microvascular complications:  Neuropathy Denies: retinopathy, CKD Last Eye Exam: Completed 02/05/2021  Macrovascular complications:   Denies: CAD, CVA, PVD   HISTORY:  Past Medical History:  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   Bowel obstruction (HCC)    Depression with anxiety 02/09/2015   mild xanax prn   Diabetes mellitus (HCC) 07/18/2015   type 2   Diverticulitis    Dysrhythmia    abnormal ekgs worked up    Essential tremor 03/31/2016   Fibromyalgia    MS   Gait difficulty 02/09/2015   Gallstones    GERD (gastroesophageal reflux disease)    HLD (hyperlipidemia)    Hypersomnia 11/09/2015   Hypertension    no meds   Hyperthyroidism    patient states has no problems at present-01/25/2019   Hypothyroidism    patient states no problems at present 01/25/2019   IBS (irritable bowel syndrome)    Insomnia 02/09/2015   Migraines    MS (multiple sclerosis) (HCC)    Multiple joint pain 03/31/2016   Numbness in both hands 03/18/2016   Other fatigue 02/09/2015   Peptic ulcer    Pneumonia    multiple times. Last time 2014   PONV (postoperative nausea and vomiting)    Urinary hesitancy 02/09/2015   Vision abnormalities    Past Surgical History:  Past Surgical History:  Procedure Laterality Date   ABDOMINAL HYSTERECTOMY  1976   including oophorectomy, due to uterine cancer   APPENDECTOMY  1974   BIOPSY  01/31/2019   Procedure: BIOPSY;  Surgeon: Napoleon Form, MD;  Location: [facility];  Service: Endoscopy;;   CARDIAC CATHETERIZATION     2001/2006 In [place]. Normal per patient   CARPAL TUNNEL RELEASE Left    carpal tunnel surgery   CHOLECYSTECTOMY     2006   COLON SURGERY     SBO x 2   COLONOSCOPY WITH PROPOFOL N/A 01/31/2019   Procedure: COLONOSCOPY WITH PROPOFOL;  Surgeon: Napoleon Form, MD;  Location: [facility];  Service: Endoscopy;  Laterality: N/A;   COLONOSCOPY WITH PROPOFOL N/A 04/24/2022   Procedure: COLONOSCOPY WITH PROPOFOL;  Surgeon: Napoleon Form, MD;  Location: [facility];  Service: Gastroenterology;  Laterality: N/A;   EUS N/A 04/09/2017   Procedure: UPPER ENDOSCOPIC ULTRASOUND (EUS) LINEAR;  Surgeon: Rachael Fee, MD;  Location: [facility];  Service: Endoscopy;  Laterality: N/A;   HERNIA REPAIR     INCISIONAL HERNIA REPAIR N/A 05/05/2022   Procedure: LAPAROSCOPIC VENTRAL HERNA REPAIR WITH MESH;  Surgeon: Fritzi Mandes, MD;  Location:  [facility] OR;  Service: General;  Laterality: N/A;   INSERTION OF MESH N/A 05/05/2022   Procedure: INSERTION OF MESH;  Surgeon: Fritzi Mandes, MD;  Location: [facility] OR;  Service: General;  Laterality: N/A;   MENISCUS REPAIR Left    POLYPECTOMY  01/31/2019   Procedure: POLYPECTOMY;  Surgeon: Napoleon Form, MD;  Location: [facility];  Service: Endoscopy;;   POLYPECTOMY  04/24/2022   Procedure: POLYPECTOMY;  Surgeon: Napoleon Form, MD;  Location: [facility];  Service: Gastroenterology;;   SMALL INTESTINE SURGERY     toenail removal Right 12/2017   great toenail    Social History:  reports that she quit smoking about 34 years ago. Her smoking use included cigarettes. She started smoking about 39 years ago. She  has a 1.3 pack-year smoking history. She has never been exposed to tobacco smoke. She has never used smokeless tobacco. She reports that she does not currently use alcohol. She reports current drug use. Drug: Oxycodone. Family History:  Family History  Problem Relation Age of Onset   Heart disease Mother    Kidney disease Mother    Diabetes Mother    Other Father 9       murdered   Multiple sclerosis Daughter    Multiple sclerosis Other    Diabetes Sister    Alcohol abuse Brother        ETOH and marijuana   Diabetes Brother    Diabetes Sister    Diabetes Brother    ALS Brother      HOME MEDICATIONS: Allergies as of 12/15/2022       Reactions   Ibuprofen Other (See Comments)   Developed a ulcer    Methylprednisolone Anaphylaxis, Shortness Of Breath, Palpitations   Naproxen Other (See Comments)   Developed a ulcer    Nsaids Other (See Comments)   Pt gets Ulcers    Prednisone Anaphylaxis, Shortness Of Breath, Palpitations   Bactrim [sulfamethoxazole-trimethoprim] Other (See Comments)   "blotchiness" redness, face swelling   Biaxin [clarithromycin] Swelling   Sulfa Antibiotics Swelling, Other (See Comments)   Angioedema   Ozempic (0.25 Or 0.5 Mg-dose)  [semaglutide(0.25 Or 0.5mg -dos)] Rash        Medication List        Accurate as of December 15, 2022  9:47 AM. If you have any questions, ask your nurse or doctor.          acetaminophen 500 MG tablet Commonly known as: TYLENOL Take 1,000 mg by mouth every 8 (eight) hours as needed for headache.   ALPRAZolam 1 MG tablet Commonly known as: XANAX Take 1 mg by mouth 3 (three) times daily as needed for anxiety.   cyclobenzaprine 10 MG tablet Commonly known as: FLEXERIL Take 10 mg by mouth 3 (three) times daily as needed for muscle spasms.   Dexcom G7 Sensor Misc 1 Device by Does not apply route as directed.   empagliflozin 25 MG Tabs tablet Commonly known as: Jardiance Take 1 tablet (25 mg total) by mouth daily.   fluconazole 100 MG tablet Commonly known as: DIFLUCAN Take 100 mg by mouth daily.   freestyle lancets 1 Device by Misc.(Non-Drug; Combo Route) route daily. USED TO CHECK BLOOD SUGAR TID   FREESTYLE LITE test strip Generic drug: glucose blood 1 each by Other route in the morning, at noon, in the evening, and at bedtime. Use as instructed   FreeStyle Lite w/Device Kit 1 Device by Does not apply route daily in the afternoon.   Insulin Pen Needle 32G X 4 MM Misc 1 Device by Does not apply route in the morning, at noon, in the evening, and at bedtime.   Lantus SoloStar 100 UNIT/ML Solostar Pen Generic drug: insulin glargine Inject 12 Units into the skin daily.   latanoprost 0.005 % ophthalmic solution Commonly known as: XALATAN Place 1 drop into both eyes at bedtime.   loratadine 10 MG tablet Commonly known as: CLARITIN Take 10 mg by mouth daily as needed for allergies.   MiraLax 17 g packet Generic drug: polyethylene glycol Take 17 g by mouth daily.   modafinil 100 MG tablet Commonly known as: PROVIGIL Take 25 mg by mouth daily as needed (Takes to help stay awake due to insomnia per patient).   mupirocin ointment 2 %  Commonly known as:  BACTROBAN Apply 1 Application topically 2 (two) times daily.   naloxone 4 MG/0.1ML Liqd nasal spray kit Commonly known as: NARCAN Place 1 spray into the nose daily as needed (overdose).   NovoLOG FlexPen 100 UNIT/ML FlexPen Generic drug: insulin aspart Max daily 30 units What changed:  how much to take how to take this when to take this additional instructions   ondansetron 4 MG tablet Commonly known as: ZOFRAN Take 1 tablet (4 mg total) by mouth every 6 (six) hours as needed for nausea or vomiting. Prior to colonoscopy prep   oxyCODONE 15 MG immediate release tablet Commonly known as: ROXICODONE Take 15 mg by mouth every 6 (six) hours as needed for pain.   pantoprazole 40 MG tablet Commonly known as: PROTONIX Take 1 tablet (40 mg total) by mouth daily.   terconazole 0.8 % vaginal cream Commonly known as: TERAZOL 3 Place 1 applicator vaginally at bedtime.   Vitamin D (Ergocalciferol) 1.25 MG (50000 UNIT) Caps capsule Commonly known as: DRISDOL Take 50,000 Units by mouth once a week.         OBJECTIVE:   Vital Signs: There were no vitals taken for this visit.  Wt Readings from Last 3 Encounters:  11/26/22 159 lb (72.1 kg)  11/17/22 153 lb 10.6 oz (69.7 kg)  10/08/22 157 lb 12.8 oz (71.6 kg)     Exam: General: Pt appears well and is in NAD  Neuro: MS is good with appropriate affect, pt is alert and Ox3   DM Foot Exam 06/06/2022   The skin of the feet is intact without sores or ulcerations. The pedal pulses are 2+ on right and 2+ on left. The sensation is intact to a screening 5.07, 10 gram monofilament bilaterally    DATA REVIEWED:  Lab Results  Component Value Date   HGBA1C 6.7 (A) 06/19/2022   HGBA1C 7.2 (A) 04/08/2022   HGBA1C 7.2 (A) 12/05/2021    Latest Reference Range & Units 11/26/22 11:37  Sodium 134 - 144 mmol/L 142  Potassium 3.5 - 5.2 mmol/L 4.9  Chloride 96 - 106 mmol/L 101  CO2 20 - 29 mmol/L 25  Glucose 70 - 99 mg/dL 253 (H)   BUN 8 - 27 mg/dL 20  Creatinine 6.64 - 4.03 mg/dL 4.74 (H)  Calcium 8.7 - 10.3 mg/dL 9.7  BUN/Creatinine Ratio 12 - 28  14  eGFR >59 mL/min/1.73 39 (L)  (H): Data is abnormally high (L): Data is abnormally low   Old records , labs and images have been reviewed.    ASSESSMENT / PLAN / RECOMMENDATIONS:   1) Type 2 Diabetes Mellitus, Optimally controlled, With neuropathic and CKD III complications - Goal A1c < 7.0 %.    -This was a virtual visit, GMI 7.1% on Dexcom download -Overall her BG's are acceptable, she did notice BG is over 150 mg/DL in the mornings, will increase basal insulin as below -Patient was encouraged to continue to use correction scale as needed for hyperglycemia with each meal -She was on Trulicity at some point, and she endorsed nonspecific symptoms to it, in hindsight she believes this had to do with hernia rather than the actual Trulicity.  She is not a good candidate for GLP-1 agonist due to her diagnosis of gastroparesis  -Patient would like to have a NovoLog dose  for snacks.  She will use 2 units of NovoLog before each snack if needed  MEDICATIONS: - Continue Jardiance 25 mg 1 tablet daily  -Increase  Lantus 14 units daily - Continue NovoLog 10 units with breakfast, 6 units with lunch, 8 units with supper - Continue CF: NovoLog (BG -130/40) 3 times daily before every meal - Novolog 2 units with a snack       EDUCATION / INSTRUCTIONS: BG monitoring instructions: Patient is instructed to check her blood sugars 2 times a day, breakfast and supper. Call [facility] if: BG persistently < 70  I reviewed the Rule of 15 for the treatment of hypoglycemia in detail with the patient. Literature supplied.   2) Diabetic complications:  Eye: Does not have known diabetic retinopathy.  Neuro/ Feet: Does  have known diabetic peripheral neuropathy .  Renal: Patient does not have known baseline CKD. She   is not on an ACEI/ARB at present.     3)  Lipids: Patient is has been off statins due to severe cramps.    F/U in 6 months   Signed electronically by: Lyndle Herrlich, MD  [facility]  [facility] [address] [place] [place], [place] [address] Phone: [phone] FAX: [phone]   CC: Rema Fendt, NP [address] [place] [address] Phone: [phone]  Fax: [phone]  Return to Endocrinology clinic as below: Future Appointments  Date Time Provider Department Center  12/15/2022  9:50 AM Raianna Slight, Konrad Dolores, MD [facility] None  01/08/2023  3:00 PM Arnaldo Natal, NP [facility]  04/06/2023 11:50 AM Everson Mott, Konrad Dolores, MD [facility] None

## 2022-12-15 NOTE — Telephone Encounter (Signed)
Patient is scheduled for 06/15/2023 at 1:00 PM.

## 2022-12-22 DIAGNOSIS — Z79899 Other long term (current) drug therapy: Secondary | ICD-10-CM | POA: Diagnosis not present

## 2022-12-22 DIAGNOSIS — R768 Other specified abnormal immunological findings in serum: Secondary | ICD-10-CM | POA: Diagnosis not present

## 2022-12-22 DIAGNOSIS — E1140 Type 2 diabetes mellitus with diabetic neuropathy, unspecified: Secondary | ICD-10-CM | POA: Diagnosis not present

## 2022-12-22 DIAGNOSIS — F3342 Major depressive disorder, recurrent, in full remission: Secondary | ICD-10-CM | POA: Diagnosis not present

## 2022-12-22 DIAGNOSIS — M79605 Pain in left leg: Secondary | ICD-10-CM | POA: Diagnosis not present

## 2022-12-22 DIAGNOSIS — E663 Overweight: Secondary | ICD-10-CM | POA: Diagnosis not present

## 2022-12-22 DIAGNOSIS — K5903 Drug induced constipation: Secondary | ICD-10-CM | POA: Diagnosis not present

## 2022-12-22 DIAGNOSIS — N1831 Chronic kidney disease, stage 3a: Secondary | ICD-10-CM | POA: Diagnosis not present

## 2022-12-22 DIAGNOSIS — M79604 Pain in right leg: Secondary | ICD-10-CM | POA: Diagnosis not present

## 2022-12-22 DIAGNOSIS — M545 Low back pain, unspecified: Secondary | ICD-10-CM | POA: Diagnosis not present

## 2022-12-22 DIAGNOSIS — G47429 Narcolepsy in conditions classified elsewhere without cataplexy: Secondary | ICD-10-CM | POA: Diagnosis not present

## 2022-12-22 DIAGNOSIS — Z9181 History of falling: Secondary | ICD-10-CM | POA: Diagnosis not present

## 2022-12-23 ENCOUNTER — Ambulatory Visit: Payer: Medicare Other | Admitting: Internal Medicine

## 2022-12-25 DIAGNOSIS — Z79899 Other long term (current) drug therapy: Secondary | ICD-10-CM | POA: Diagnosis not present

## 2022-12-31 ENCOUNTER — Encounter: Payer: Self-pay | Admitting: Family

## 2022-12-31 DIAGNOSIS — F411 Generalized anxiety disorder: Secondary | ICD-10-CM | POA: Diagnosis not present

## 2022-12-31 DIAGNOSIS — F4321 Adjustment disorder with depressed mood: Secondary | ICD-10-CM | POA: Diagnosis not present

## 2023-01-08 ENCOUNTER — Ambulatory Visit (INDEPENDENT_AMBULATORY_CARE_PROVIDER_SITE_OTHER): Payer: Medicare Other | Admitting: Nurse Practitioner

## 2023-01-08 ENCOUNTER — Other Ambulatory Visit (INDEPENDENT_AMBULATORY_CARE_PROVIDER_SITE_OTHER): Payer: Medicare Other

## 2023-01-08 ENCOUNTER — Encounter: Payer: Self-pay | Admitting: Nurse Practitioner

## 2023-01-08 VITALS — BP 120/80 | HR 76 | Ht 62.0 in | Wt 156.0 lb

## 2023-01-08 DIAGNOSIS — K56609 Unspecified intestinal obstruction, unspecified as to partial versus complete obstruction: Secondary | ICD-10-CM

## 2023-01-08 LAB — BASIC METABOLIC PANEL
BUN: 18 mg/dL (ref 6–23)
CO2: 30 meq/L (ref 19–32)
Calcium: 9.7 mg/dL (ref 8.4–10.5)
Chloride: 101 meq/L (ref 96–112)
Creatinine, Ser: 1.08 mg/dL (ref 0.40–1.20)
GFR: 52.94 mL/min — ABNORMAL LOW (ref >60.00)
Glucose, Bld: 115 mg/dL — ABNORMAL HIGH (ref 70–99)
Potassium: 3.9 meq/L (ref 3.5–5.1)
Sodium: 138 meq/L (ref 135–145)

## 2023-01-08 NOTE — Patient Instructions (Addendum)
Your provider has requested that you go to the basement level for lab work before leaving today. Press "B" on the elevator. The lab is located at the first door on the left as you exit the elevator.  Miralax- every night to avoid constipation (over the counter)  Drink 64 ounces of water daily.  Follow up as needed.  Due to recent changes in healthcare laws, you may see the results of your imaging and laboratory studies on MyChart before your provider has had a chance to review them.  We understand that in some cases there may be results that are confusing or concerning to you. Not all laboratory results come back in the same time frame and the provider may be waiting for multiple results in order to interpret others.  Please give [place] 48 hours in order for your provider to thoroughly review all the results before contacting the office for clarification of your results.   Thank you for trusting me with your gastrointestinal care!   Alcide Evener, CRNP

## 2023-01-08 NOTE — Progress Notes (Signed)
01/08/2023 Courtney [name] [id number] 03/06/1955   Chief Complaint: Follow up hospital admission, small bowel obstruction.   History of Present Illness:  Courtney [name] is a 67 year old female with a past medical history of arthritis, asthma, anxiety, depression, hypertension, coronary artery disease, diabetes mellitus type II, multiple sclerosis, SBO x 2, diverticulitis. S/P ventral hernia repair with mesh placement 05/04/2022.  She is known by Dr. Lavon Paganini.  She was admitted to the hospital 9/02-2023 due to having abdominal pain.  She was diagnosed with a small bowel obstruction likely secondary to adhesions from prior surgeries.  General surgery was consulted, Gastrografin/SBO protocol was initiated.  The patient initially refused NG tube but with worsening symptoms agreed to have placed.  Her abdominal pain improved with conservative management, NG tube was removed and her diet was slowly advanced.  She was discharged home on 11/17/2022.  She presents today for further follow-up.  She denies having any nausea or vomiting.  She has constipation.  She is passing a bowel movement every other day.  She describes passing stools that are mostly hard and sometimes soft.  No rectal bleeding or black stools.  No GERD symptoms.  She swallowed an antibiotic pill last week which felt like it went down sideways.  She stated she was prescribed an antibiotic, was not Doxycycline, for sinus infection.  She felt like her esophagus was on fire and she could not drink water or Ensure for few days which abated without recurrence.     Latest Ref Rng & Units 11/26/2022   11:37 AM 11/17/2022    3:32 AM 11/16/2022    3:10 AM  CBC  WBC 3.4 - 10.8 x10E3/uL 7.6  6.9  8.3   Hemoglobin 11.1 - 15.9 g/dL 64.4  03.4  74.2   Hematocrit 34.0 - 46.6 % 40.8  36.0  39.4   Platelets 150 - 450 x10E3/uL 167  167  188        Latest Ref Rng & Units 11/26/2022   11:37 AM 11/17/2022    3:32 AM 11/16/2022    3:10 AM  CMP   Glucose 70 - 99 mg/dL 595  638  756   BUN 8 - 27 mg/dL 20  12  15    Creatinine 0.57 - 1.00 mg/dL 4.33  2.95  1.88   Sodium 134 - 144 mmol/L 142  135  140   Potassium 3.5 - 5.2 mmol/L 4.9  3.3  3.7   Chloride 96 - 106 mmol/L 101  102  104   CO2 20 - 29 mmol/L 25  22  24    Calcium 8.7 - 10.3 mg/dL 9.7  8.4  8.8        Latest Ref Rng & Units 11/15/2022    3:23 AM 11/13/2022    8:26 PM 08/08/2022   12:00 AM  Hepatic Function  Total Protein 6.5 - 8.1 g/dL 7.5  8.0  7.6   Albumin 3.5 - 5.0 g/dL 3.9  4.5  4.9   AST 15 - 41 U/L 20  23  19    ALT 0 - 44 U/L 15  16  11    Alk Phosphatase 38 - 126 U/L 52  64  92   Total Bilirubin 0.3 - 1.2 mg/dL 1.1  1.1  0.3    CTAP 06/16/6061 with contrast:  FINDINGS: Lower chest: Mild linear atelectasis at the lung bases with some scarring.   Hepatobiliary: No focal liver abnormality.Biliary dilatation post cholecystectomy, possibly reservoir effect  given stability, CBD measuring up to 14 mm. No calcified choledocholithiasis. Chart history of EUS.   Pancreas: Generalized atrophy   Spleen: Unremarkable.   Adrenals/Urinary Tract: Negative adrenals. No hydronephrosis or stone. Unremarkable bladder.   Stomach/Bowel: Dilated and fluid-filled small bowel with fecalized material before a abrupt transition in the left paramedian abdomen where there is angulated bowel, the segment measures up to 3.6 cm in diameter and is associated with mild mesenteric stranding. 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 Distal colonic diverticulosis.   Vascular/Lymphatic: No acute vascular abnormality. No mass or adenopathy.   Reproductive:Hysterectomy   Other: No ascites or pneumoperitoneum.   Musculoskeletal: No acute abnormalities.   IMPRESSION: 1. Small bowel obstruction in the left mid abdomen where there is angulation of bowel loops. 2. Chronic biliary dilatation post cholecystectomy.  Abdominal  x-ray 11/14/2022: 1. Gastric tube tip in the gastric body. 2. Suspected mildly dilated small bowel loop in the central upper abdomen with some dilute contrast medium in this vicinity. 3. Mild atelectasis along the left hemidiaphragm.  Abdominal x-ray 11/15/2022: 1. Persistent central small-bowel dilation, but passage of contrast into the colon. This supports small central small-bowel dilation as focal adynamic ileus versus obstruction.  Past Surgical History:  Procedure Laterality Date   ABDOMINAL HYSTERECTOMY  1976   including oophorectomy, due to uterine cancer   APPENDECTOMY  1974   BIOPSY  01/31/2019   Procedure: BIOPSY;  Surgeon: Napoleon Form, MD;  Location: [facility];  Service: Endoscopy;;   CARDIAC CATHETERIZATION     2001/2006 In [place]. Normal per patient   CARPAL TUNNEL RELEASE Left    carpal tunnel surgery   CHOLECYSTECTOMY     2006   COLON SURGERY     SBO x 2   COLONOSCOPY WITH PROPOFOL N/A 01/31/2019   Procedure: COLONOSCOPY WITH PROPOFOL;  Surgeon: Napoleon Form, MD;  [facility]: [facility];  Service: Endoscopy;  Laterality: N/A;   COLONOSCOPY WITH PROPOFOL N/A 04/24/2022   Procedure: COLONOSCOPY WITH PROPOFOL;  Surgeon: Napoleon Form, MD;  Location: [facility];  Service: Gastroenterology;  Laterality: N/A;   EUS N/A 04/09/2017   Procedure: UPPER ENDOSCOPIC ULTRASOUND (EUS) LINEAR;  Surgeon: Rachael Fee, MD;  Location: [facility];  Service: Endoscopy;  Laterality: N/A;   HERNIA REPAIR     INCISIONAL HERNIA REPAIR N/A 05/05/2022   Procedure: LAPAROSCOPIC VENTRAL HERNA REPAIR WITH MESH;  Surgeon: Fritzi Mandes, MD;  Location[facility];  Service: General;  Laterality: N/A;   INSERTION OF MESH N/A 05/05/2022   Procedure: INSERTION OF MESH;  Surgeon: Fritzi Mandes, MD;  Location: [facility];  Service: General;  Laterality: N/A;   MENISCUS REPAIR Left    POLYPECTOMY  01/31/2019   Procedure: POLYPECTOMY;  Surgeon: Napoleon Form, MD;   [facility]: [facility];  Service: Endoscopy;;   POLYPECTOMY  04/24/2022   Procedure: POLYPECTOMY;  Surgeon: Napoleon Form, MD;  Location: [facility];  Service: Gastroenterology;;   SMALL INTESTINE SURGERY     toenail removal Right 12/2017   great toenail    Colonoscopy 04/24/2022:  - Two 3 to 4 mm polyps in the rectum and in the sigmoid colon, removed with a cold snare.   - Non-bleeding external and internal hemorrhoids.  - The examination was otherwise normal. - 5 year recall colonoscopy  A. COLON, SIGMOID, RECTUM, POLYPECTOMY: - Tubular adenoma without high-grade dysplasia or malignancy - Hyperplastic polyp  Current Outpatient Medications on File  Prior to Visit  Medication Sig Dispense Refill   acetaminophen (TYLENOL) 500 MG tablet Take 1,000 mg by mouth every 8 (eight) hours as needed for headache.     ALPRAZolam (XANAX) 1 MG tablet Take 1 mg by mouth 3 (three) times daily as needed for anxiety.     Blood Glucose Monitoring Suppl (FREESTYLE LITE) w/Device KIT 1 Device by Does not apply route daily in the afternoon. 1 kit 0   Continuous Blood Gluc Sensor (DEXCOM G7 SENSOR) MISC 1 Device by Does not apply route as directed. 9 each 3   cyclobenzaprine (FLEXERIL) 10 MG tablet Take 10 mg by mouth 3 (three) times daily as needed for muscle spasms.     empagliflozin (JARDIANCE) 25 MG TABS tablet Take 1 tablet (25 mg total) by mouth daily. 90 tablet 3   fluconazole (DIFLUCAN) 100 MG tablet Take 100 mg by mouth daily.     glucose blood (FREESTYLE LITE) test strip 1 each by Other route in the morning, at noon, in the evening, and at bedtime. Use as instructed 400 each 3   insulin aspart (NOVOLOG FLEXPEN) 100 UNIT/ML FlexPen Max daily 30 units (Patient taking differently: Inject 6-10 Units into the skin 3 (three) times daily with meals.) 3 mL 0   insulin glargine (LANTUS SOLOSTAR) 100 UNIT/ML Solostar Pen Inject 12 Units into the skin daily. 3 mL 0   Insulin Pen Needle 32G X 4 MM MISC 1  Device by Does not apply route in the morning, at noon, in the evening, and at bedtime. 400 each 3   Lancets (FREESTYLE) lancets 1 Device by Misc.(Non-Drug; Combo Route) route daily. USED TO CHECK BLOOD SUGAR TID 200 each 3   latanoprost (XALATAN) 0.005 % ophthalmic solution Place 1 drop into both eyes at bedtime.     loratadine (CLARITIN) 10 MG tablet Take 10 mg by mouth daily as needed for allergies.     modafinil (PROVIGIL) 100 MG tablet Take 25 mg by mouth daily as needed (Takes to help stay awake due to insomnia per patient).     mupirocin ointment (BACTROBAN) 2 % Apply 1 Application topically 2 (two) times daily. 22 g 0   naloxone (NARCAN) nasal spray 4 mg/0.1 mL Place 1 spray into the nose daily as needed (overdose).     ondansetron (ZOFRAN) 4 MG tablet Take 1 tablet (4 mg total) by mouth every 6 (six) hours as needed for nausea or vomiting. Prior to colonoscopy prep 15 tablet 0   oxyCODONE (ROXICODONE) 15 MG immediate release tablet Take 15 mg by mouth every 6 (six) hours as needed for pain.     pantoprazole (PROTONIX) 40 MG tablet Take 1 tablet (40 mg total) by mouth daily. 90 tablet 3   polyethylene glycol (MIRALAX) 17 g packet Take 17 g by mouth daily.     terconazole (TERAZOL 3) 0.8 % vaginal cream Place 1 applicator vaginally at bedtime.     Vitamin D, Ergocalciferol, (DRISDOL) 1.25 MG (50000 UNIT) CAPS capsule Take 50,000 Units by mouth once a week.     [DISCONTINUED] sucralfate (CARAFATE) 1 g tablet Take 1 tablet (1 g total) by mouth 4 (four) times daily -  before meals and at bedtime. Dissolve 1 tablet in 2-4 oz of water, drink as slurry 120 tablet 1   No current facility-administered medications on file prior to visit.   Allergies  Allergen Reactions   Ibuprofen Other (See Comments)    Developed a ulcer     Methylprednisolone Anaphylaxis,  Shortness Of Breath and Palpitations   Naproxen Other (See Comments)    Developed a ulcer    Nsaids Other (See Comments)    Pt gets  Ulcers    Prednisone Anaphylaxis, Shortness Of Breath and Palpitations   Bactrim [Sulfamethoxazole-Trimethoprim] Other (See Comments)    "blotchiness" redness, face swelling   Biaxin [Clarithromycin] Swelling   Sulfa Antibiotics Swelling and Other (See Comments)    Angioedema    Ozempic (0.25 Or 0.5 Mg-Dose) [Semaglutide(0.25 Or 0.5mg -Dos)] Rash   Current Medications, Allergies, Past Medical History, Past Surgical History, Family History and Social History were reviewed in [employer] record.  Review of Systems:   Constitutional: Negative for fever, sweats, chills or weight loss.  Respiratory: Negative for shortness of breath.   Cardiovascular: Negative for chest pain, palpitations and leg swelling.  Gastrointestinal: See HPI.  Musculoskeletal: Negative for back pain or muscle aches.  Neurological: Negative for dizziness, headaches or paresthesias.   Physical Exam: BP 120/80 (BP Location: Left Arm, Patient Position: Sitting, Cuff Size: Normal)   Pulse 76   Ht 5\' 2"  (1.575 m)   Wt 156 lb (70.8 kg)   SpO2 97%   BMI 28.53 kg/m   Wt Readings from Last 3 Encounters:  01/08/23 156 lb (70.8 kg)  11/26/22 159 lb (72.1 kg)  11/17/22 153 lb 10.6 oz (69.7 kg)    General: 67 year old female in no acute distress. Head: Normocephalic and atraumatic. Eyes: No scleral icterus. Conjunctiva pink . Ears: Normal auditory acuity. Mouth: Dentition intact. No ulcers or lesions.  Lungs: Clear throughout to auscultation. Heart: Regular rate and rhythm, no murmur. Abdomen: Soft, nondistended. Trace tenderness left of the umbilicus without rebound or guarding.  No masses or hepatomegaly. Normal bowel sounds x 4 quadrants.  Rectal: Deferred. Musculoskeletal: Symmetrical with no gross deformities. Extremities: No edema. Neurological: Alert oriented x 4. No focal deficits.  Psychological: Alert and cooperative. Normal mood and affect  Assessment and  Recommendations:  67 year old female admitted to the hospital with recurrent SBO, likely due from adhesions from prior abdominal surgeries.  SBO resolved with conservative management without surgical intervention. -Patient instructed to drink 64 ounces of water daily, diet as tolerated -Avoid constipation, see instructions below -Limit opioid use -Patient to present to the ED if she develops recurrent significant abdominal pain or nausea/vomiting  History of colon polyps, 1 tubular adenomatous and 1 hyperplastic polyp removed from the colon and rectum per colonoscopy 04/2022 -Next colonoscopy due 04/2027  Constipation -MiraLAX nightly, may increase to twice daily

## 2023-01-19 DIAGNOSIS — Z6828 Body mass index (BMI) 28.0-28.9, adult: Secondary | ICD-10-CM | POA: Diagnosis not present

## 2023-01-19 DIAGNOSIS — M79605 Pain in left leg: Secondary | ICD-10-CM | POA: Diagnosis not present

## 2023-01-19 DIAGNOSIS — G5601 Carpal tunnel syndrome, right upper limb: Secondary | ICD-10-CM | POA: Diagnosis not present

## 2023-01-19 DIAGNOSIS — R768 Other specified abnormal immunological findings in serum: Secondary | ICD-10-CM | POA: Diagnosis not present

## 2023-01-19 DIAGNOSIS — M545 Low back pain, unspecified: Secondary | ICD-10-CM | POA: Diagnosis not present

## 2023-01-19 DIAGNOSIS — S62325A Displaced fracture of shaft of fourth metacarpal bone, left hand, initial encounter for closed fracture: Secondary | ICD-10-CM | POA: Insufficient documentation

## 2023-01-19 DIAGNOSIS — S62355A Nondisplaced fracture of shaft of fourth metacarpal bone, left hand, initial encounter for closed fracture: Secondary | ICD-10-CM | POA: Diagnosis not present

## 2023-01-19 DIAGNOSIS — Z79899 Other long term (current) drug therapy: Secondary | ICD-10-CM | POA: Diagnosis not present

## 2023-01-19 DIAGNOSIS — Z9181 History of falling: Secondary | ICD-10-CM | POA: Diagnosis not present

## 2023-01-19 DIAGNOSIS — M79604 Pain in right leg: Secondary | ICD-10-CM | POA: Diagnosis not present

## 2023-01-22 DIAGNOSIS — Z79899 Other long term (current) drug therapy: Secondary | ICD-10-CM | POA: Diagnosis not present

## 2023-02-09 ENCOUNTER — Encounter: Payer: Self-pay | Admitting: Orthopedic Surgery

## 2023-02-09 ENCOUNTER — Ambulatory Visit (INDEPENDENT_AMBULATORY_CARE_PROVIDER_SITE_OTHER): Payer: Medicare Other | Admitting: Orthopedic Surgery

## 2023-02-09 DIAGNOSIS — M545 Low back pain, unspecified: Secondary | ICD-10-CM

## 2023-02-09 DIAGNOSIS — G5601 Carpal tunnel syndrome, right upper limb: Secondary | ICD-10-CM | POA: Diagnosis not present

## 2023-02-09 DIAGNOSIS — M5410 Radiculopathy, site unspecified: Secondary | ICD-10-CM

## 2023-02-10 ENCOUNTER — Telehealth: Payer: Self-pay | Admitting: Orthopedic Surgery

## 2023-02-10 MED ORDER — COLCHICINE 0.6 MG PO TABS: 0.6000 mg | ORAL_TABLET | Freq: Every day | ORAL | 1 refills | Status: DC

## 2023-02-10 NOTE — Telephone Encounter (Signed)
Per Dr. Lajoyce Corners, Colchicine 0.6mg  once a day. I have sent this to [facility]. She was informed through her mychart message.

## 2023-02-10 NOTE — Telephone Encounter (Signed)
Pt called in stating Courtney [name] was going to prescribe her something that started with a C and was going to sent it to the [facility] on file no medication has been called in please advise

## 2023-02-11 ENCOUNTER — Encounter: Payer: Self-pay | Admitting: Orthopedic Surgery

## 2023-02-11 NOTE — Progress Notes (Signed)
Office Visit Note   Patient: Courtney [name]           Date of Birth: November 14, 1955           MRN: [id number] Visit Date: 02/09/2023              Requested by: Rema Fendt, NP [address] [place],  [place] [address] PCP: Rema Fendt, NP  Chief Complaint  Patient presents with   Lower Back - Pain      HPI: Patient is a 67 year old woman who presents for 3 separate issues.  #1 she has chronic lower back pain with right-sided radicular symptoms.  She states she has right thigh numbness.  Patient had been referred to Dr. Alvester Morin for evaluation for epidural steroid injection but patient states she does not want to consider injection for prednisone.  Patient states she is currently in pain management.  She has an MRI scan of her lumbar spine in 2021.  Patient has a history of diabetes she uses a remote sensor and she states her sugars are stable in the 100s.  Patient states she has had carpal tunnel syndrome the left wrist and has carpal tunnel symptoms on the right with numbness into the thumb and index finger she has tried a brace without relief.  Assessment & Plan: Visit Diagnoses:  1. Lumbar spine pain   2. Radicular pain of right lower extremity   3. Carpal tunnel syndrome of right wrist     Plan: Will try colchicine for anti-inflammatory 0.6 mg a day.  Plan to follow-up with Morrie Sheldon for evaluation of the right thumb and carpal tunnel symptoms on the right.  Follow-Up Instructions: Return in about 4 weeks (around 03/09/2023).   Ortho Exam  Patient is alert, oriented, no adenopathy, well-dressed, normal affect, normal respiratory effort. Examination patient has right lateral buttocks pain that radiates to her knee with numbness.  She has a negative straight leg raise no focal motor weakness.  Patient has some tenderness to palpation over the transverse carpal ligament.  She states she has some triggering of her thumb.  Patient is currently on narcotics with her  pain management.  Imaging: No results found. No images are attached to the encounter.  Labs: Lab Results  Component Value Date   HGBA1C 6.7 (A) 06/19/2022   HGBA1C 7.2 (A) 04/08/2022   HGBA1C 7.2 (A) 12/05/2021   ESRSEDRATE 14 11/27/2017   ESRSEDRATE 7 03/31/2016   CRP 2.4 03/31/2016   LABURIC 4.5 11/27/2017   REPTSTATUS 11/15/2022 FINAL 11/13/2022   CULT  11/13/2022    NO GROWTH Performed at [facility], [address], [place], [place] [address]      Lab Results  Component Value Date   ALBUMIN 3.9 11/15/2022   ALBUMIN 4.5 11/13/2022   ALBUMIN 4.9 08/08/2022    Lab Results  Component Value Date   MG 1.7 11/17/2022   MG 2.2 11/16/2022   MG 2.4 11/15/2022   No results found for: "VD25OH"  No results found for: "PREALBUMIN"    Latest Ref Rng & Units 11/26/2022   11:37 AM 11/17/2022    3:32 AM 11/16/2022    3:10 AM  CBC EXTENDED  WBC 3.4 - 10.8 x10E3/uL 7.6  6.9  8.3   RBC 3.77 - 5.28 x10E6/uL 4.54  4.29  4.54   Hemoglobin 11.1 - 15.9 g/dL 25.3  66.4  40.3   HCT 34.0 - 46.6 % 40.8  36.0  39.4  Platelets 150 - 450 x10E3/uL 167  167  188   NEUT# 1.4 - 7.0 x10E3/uL 5.2     Lymph# 0.7 - 3.1 x10E3/uL 1.8        There is no height or weight on file to calculate BMI.  Orders:  No orders of the defined types were placed in this encounter.  No orders of the defined types were placed in this encounter.    Procedures: No procedures performed  Clinical Data: No additional findings.  ROS:  All other systems negative, except as noted in the HPI. Review of Systems  Objective: Vital Signs: There were no vitals taken for this visit.  Specialty Comments:  No specialty comments available.  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   Sebaceous cyst 05/03/2021   Seborrheic dermatitis of scalp 05/03/2021   Biceps tendonitis on left 04/30/2021   CKD stage 3a, GFR 45-59 ml/min (HCC) 10/21/2020   Insulin dependent type 2 diabetes mellitus (HCC) 10/21/2020   Type 2 diabetes mellitus with diabetic polyneuropathy, with long-term current use of insulin (HCC) 06/20/2020   Dyslipidemia, goal LDL below 70 04/10/2020   Episode of recurrent major depressive disorder (HCC) 04/10/2020   Pain in left shoulder 04/04/2020   Vaginal dryness, menopausal 07/07/2019   Tremor 05/30/2019   Memory loss 05/03/2019   Hx of adenomatous colonic polyps 03/16/2019   History of colonic polyps 03/16/2019   Fracture of distal phalanx of right ring finger 03/15/2019   Facet arthritis, degenerative, 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   Polyp of transverse colon    Special screening for malignant neoplasms, colon    Polyp of sigmoid colon    Anxiety 09/09/2018   GERD (gastroesophageal reflux disease) 09/09/2018   Cervicalgia 08/10/2018   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   Laryngopharyngeal reflux (LPR) 05/14/2018   Throat pain 05/14/2018   Loss of weight    RLQ abdominal pain 03/27/2017   Bloating 03/27/2017   Abnormal finding on imaging 03/27/2017   Dilation of biliary tract 03/27/2017   Nausea 03/27/2017   Chronic sinusitis 03/19/2017   Multiple joint pain 03/31/2016   Essential tremor 03/31/2016   Numbness in both hands 03/18/2016   Hypersomnia 11/09/2015   Diabetes mellitus (HCC) 07/18/2015   Abnormal ECG 07/18/2015   Gait difficulty 02/09/2015   Chronic left shoulder pain 02/09/2015   Generalized anxiety disorder 02/09/2015   Numbness 02/09/2015   Insomnia 02/09/2015   Urinary hesitancy  02/09/2015   Other fatigue 02/09/2015   Fibromyalgia 02/09/2015   Depression 02/09/2015   Essential hypertension 09/01/2011   Coronary artery disease involving native coronary artery of native heart without angina pectoris 09/01/2011   Parkinson's disease (HCC) 09/01/2011   Type 2 diabetes mellitus without complications (HCC) 09/01/2011   Chronic pain 09/01/2011   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   Bowel obstruction (HCC)    Depression  with anxiety 02/09/2015   mild xanax prn   Diabetes mellitus (HCC) 07/18/2015   type 2   Diverticulitis    Dysrhythmia    abnormal ekgs worked up   Essential tremor 03/31/2016   Fibromyalgia    MS   Gait difficulty 02/09/2015   Gallstones    GERD (gastroesophageal reflux disease)    HLD (hyperlipidemia)    Hypersomnia 11/09/2015   Hypertension    no meds   Hyperthyroidism    patient states has no problems at present-01/25/2019   Hypothyroidism    patient states no problems at present 01/25/2019   IBS (irritable bowel syndrome)    Insomnia 02/09/2015   Migraines    MS (multiple sclerosis) (HCC)    Multiple joint pain 03/31/2016   Numbness in both hands 03/18/2016   Other fatigue 02/09/2015   Peptic ulcer    Pneumonia    multiple times. Last time 2014   PONV (postoperative nausea and vomiting)    Urinary hesitancy 02/09/2015   Vision abnormalities     Family History  Problem Relation Age of Onset   Heart disease Mother    Kidney disease Mother    Diabetes Mother    Other Father 10       murdered   Multiple sclerosis Daughter    Multiple sclerosis Other    Diabetes Sister    Alcohol abuse Brother        ETOH and marijuana   Diabetes Brother    Diabetes Sister    Diabetes Brother    ALS Brother     Past Surgical History:  Procedure Laterality Date   ABDOMINAL  HYSTERECTOMY  1976   including oophorectomy, due to uterine cancer   APPENDECTOMY  1974   BIOPSY  01/31/2019   Procedure: BIOPSY;  Surgeon: Napoleon Form, MD;  Location: [facility];  Service: Endoscopy;;   CARDIAC CATHETERIZATION     2001/2006 In [place]. Normal per patient   CARPAL TUNNEL RELEASE Left    carpal tunnel surgery   CHOLECYSTECTOMY     2006   COLON SURGERY     SBO x 2   COLONOSCOPY WITH PROPOFOL N/A 01/31/2019   Procedure: COLONOSCOPY WITH PROPOFOL;  Surgeon: Napoleon Form, MD;  [facility];  Service: Endoscopy;  Laterality: N/A;   COLONOSCOPY WITH PROPOFOL N/A 04/24/2022   Procedure: COLONOSCOPY WITH PROPOFOL;  Surgeon: Napoleon Form, MD;  [facility]: [facility];  Service: Gastroenterology;  Laterality: N/A;   EUS N/A 04/09/2017   Procedure: UPPER ENDOSCOPIC ULTRASOUND (EUS) LINEAR;  Surgeon: Rachael Fee, MD;  [facility]: [facility];  Service: Endoscopy;  Laterality: N/A;   HERNIA REPAIR     INCISIONAL HERNIA REPAIR N/A 05/05/2022   Procedure: LAPAROSCOPIC VENTRAL HERNA REPAIR WITH MESH;  Surgeon: Fritzi Mandes, MD;  Location[facility] OR;  Service: General;  Laterality: N/A;   INSERTION OF MESH N/A 05/05/2022   Procedure: INSERTION OF MESH;  Surgeon: Fritzi Mandes, MD;  Location: [facility] OR;  Service: General;  Laterality: N/A;   MENISCUS REPAIR Left    POLYPECTOMY  01/31/2019   Procedure: POLYPECTOMY;  Surgeon: Napoleon Form, MD;  [facility];  Service: Endoscopy;;   POLYPECTOMY  04/24/2022   Procedure: POLYPECTOMY;  Surgeon: Napoleon Form, MD;  [facility];  Service: Gastroenterology;;   SMALL INTESTINE SURGERY     toenail removal Right 12/2017   great toenail    Social History   Occupational History   Occupation:  disabled  Tobacco Use   Smoking status: Former    Current packs/day: 0.00    Average packs/day: 0.3 packs/day for 5.0 years (1.3 ttl pk-yrs)    Types: Cigarettes    Start date: 96     Quit date: 48    Years since quitting: 34.9    Passive exposure: Never   Smokeless tobacco: Never  Vaping Use   Vaping status: Never Used  Substance and Sexual Activity   Alcohol use: Not Currently    Comment: rarely   Drug use: Yes    Types: Oxycodone   Sexual activity: Yes

## 2023-02-12 DIAGNOSIS — F4321 Adjustment disorder with depressed mood: Secondary | ICD-10-CM | POA: Diagnosis not present

## 2023-02-12 DIAGNOSIS — F411 Generalized anxiety disorder: Secondary | ICD-10-CM | POA: Diagnosis not present

## 2023-02-12 DIAGNOSIS — F330 Major depressive disorder, recurrent, mild: Secondary | ICD-10-CM | POA: Diagnosis not present

## 2023-02-16 DIAGNOSIS — E1140 Type 2 diabetes mellitus with diabetic neuropathy, unspecified: Secondary | ICD-10-CM | POA: Diagnosis not present

## 2023-02-16 DIAGNOSIS — N1831 Chronic kidney disease, stage 3a: Secondary | ICD-10-CM | POA: Diagnosis not present

## 2023-02-16 DIAGNOSIS — M79604 Pain in right leg: Secondary | ICD-10-CM | POA: Diagnosis not present

## 2023-02-16 DIAGNOSIS — R768 Other specified abnormal immunological findings in serum: Secondary | ICD-10-CM | POA: Diagnosis not present

## 2023-02-16 DIAGNOSIS — E663 Overweight: Secondary | ICD-10-CM | POA: Diagnosis not present

## 2023-02-16 DIAGNOSIS — Z79899 Other long term (current) drug therapy: Secondary | ICD-10-CM | POA: Diagnosis not present

## 2023-02-16 DIAGNOSIS — M79605 Pain in left leg: Secondary | ICD-10-CM | POA: Diagnosis not present

## 2023-02-16 DIAGNOSIS — M545 Low back pain, unspecified: Secondary | ICD-10-CM | POA: Diagnosis not present

## 2023-02-16 DIAGNOSIS — G47429 Narcolepsy in conditions classified elsewhere without cataplexy: Secondary | ICD-10-CM | POA: Diagnosis not present

## 2023-02-16 DIAGNOSIS — Z9181 History of falling: Secondary | ICD-10-CM | POA: Diagnosis not present

## 2023-02-16 DIAGNOSIS — K5903 Drug induced constipation: Secondary | ICD-10-CM | POA: Diagnosis not present

## 2023-02-16 DIAGNOSIS — F3342 Major depressive disorder, recurrent, in full remission: Secondary | ICD-10-CM | POA: Diagnosis not present

## 2023-02-19 DIAGNOSIS — Z79899 Other long term (current) drug therapy: Secondary | ICD-10-CM | POA: Diagnosis not present

## 2023-02-23 DIAGNOSIS — S62355D Nondisplaced fracture of shaft of fourth metacarpal bone, left hand, subsequent encounter for fracture with routine healing: Secondary | ICD-10-CM | POA: Diagnosis not present

## 2023-03-09 ENCOUNTER — Ambulatory Visit: Payer: Medicare Other | Admitting: Orthopedic Surgery

## 2023-03-16 ENCOUNTER — Ambulatory Visit: Payer: Medicare Other | Admitting: Orthopedic Surgery

## 2023-03-17 DIAGNOSIS — Z79899 Other long term (current) drug therapy: Secondary | ICD-10-CM | POA: Diagnosis not present

## 2023-03-17 DIAGNOSIS — G47429 Narcolepsy in conditions classified elsewhere without cataplexy: Secondary | ICD-10-CM | POA: Diagnosis not present

## 2023-03-17 DIAGNOSIS — Z9181 History of falling: Secondary | ICD-10-CM | POA: Diagnosis not present

## 2023-03-17 DIAGNOSIS — F3342 Major depressive disorder, recurrent, in full remission: Secondary | ICD-10-CM | POA: Diagnosis not present

## 2023-03-17 DIAGNOSIS — E559 Vitamin D deficiency, unspecified: Secondary | ICD-10-CM | POA: Diagnosis not present

## 2023-03-17 DIAGNOSIS — M79605 Pain in left leg: Secondary | ICD-10-CM | POA: Diagnosis not present

## 2023-03-17 DIAGNOSIS — N1831 Chronic kidney disease, stage 3a: Secondary | ICD-10-CM | POA: Diagnosis not present

## 2023-03-17 DIAGNOSIS — M79604 Pain in right leg: Secondary | ICD-10-CM | POA: Diagnosis not present

## 2023-03-17 DIAGNOSIS — E1140 Type 2 diabetes mellitus with diabetic neuropathy, unspecified: Secondary | ICD-10-CM | POA: Diagnosis not present

## 2023-03-17 DIAGNOSIS — M129 Arthropathy, unspecified: Secondary | ICD-10-CM | POA: Diagnosis not present

## 2023-03-17 DIAGNOSIS — E663 Overweight: Secondary | ICD-10-CM | POA: Diagnosis not present

## 2023-03-17 DIAGNOSIS — K5903 Drug induced constipation: Secondary | ICD-10-CM | POA: Diagnosis not present

## 2023-03-17 DIAGNOSIS — M545 Low back pain, unspecified: Secondary | ICD-10-CM | POA: Diagnosis not present

## 2023-03-19 DIAGNOSIS — Z79899 Other long term (current) drug therapy: Secondary | ICD-10-CM | POA: Diagnosis not present

## 2023-03-19 DIAGNOSIS — F330 Major depressive disorder, recurrent, mild: Secondary | ICD-10-CM | POA: Diagnosis not present

## 2023-03-19 DIAGNOSIS — F411 Generalized anxiety disorder: Secondary | ICD-10-CM | POA: Diagnosis not present

## 2023-03-24 DIAGNOSIS — H2513 Age-related nuclear cataract, bilateral: Secondary | ICD-10-CM | POA: Diagnosis not present

## 2023-03-24 DIAGNOSIS — H40023 Open angle with borderline findings, high risk, bilateral: Secondary | ICD-10-CM | POA: Diagnosis not present

## 2023-04-02 ENCOUNTER — Encounter: Payer: Self-pay | Admitting: Family

## 2023-04-02 ENCOUNTER — Encounter: Payer: Self-pay | Admitting: Internal Medicine

## 2023-04-03 ENCOUNTER — Other Ambulatory Visit: Payer: Self-pay | Admitting: Family

## 2023-04-03 DIAGNOSIS — Z9889 Other specified postprocedural states: Secondary | ICD-10-CM

## 2023-04-03 DIAGNOSIS — B3731 Acute candidiasis of vulva and vagina: Secondary | ICD-10-CM

## 2023-04-03 MED ORDER — MUPIROCIN 2 % EX OINT: 1.0000 | TOPICAL_OINTMENT | Freq: Two times a day (BID) | CUTANEOUS | 2 refills | Status: AC

## 2023-04-03 MED ORDER — FLUCONAZOLE 100 MG PO TABS: 100.0000 mg | ORAL_TABLET | Freq: Every day | ORAL | 0 refills | Status: AC

## 2023-04-03 NOTE — Telephone Encounter (Signed)
-   Fluconazole prescribed. Follow-up with Gynecology.  - Mupirocin ointment prescribed.

## 2023-04-06 ENCOUNTER — Ambulatory Visit: Payer: Medicare Other | Admitting: Internal Medicine

## 2023-04-10 DIAGNOSIS — S62355D Nondisplaced fracture of shaft of fourth metacarpal bone, left hand, subsequent encounter for fracture with routine healing: Secondary | ICD-10-CM | POA: Diagnosis not present

## 2023-04-18 DIAGNOSIS — G47429 Narcolepsy in conditions classified elsewhere without cataplexy: Secondary | ICD-10-CM | POA: Diagnosis not present

## 2023-04-18 DIAGNOSIS — E663 Overweight: Secondary | ICD-10-CM | POA: Diagnosis not present

## 2023-04-18 DIAGNOSIS — K5903 Drug induced constipation: Secondary | ICD-10-CM | POA: Diagnosis not present

## 2023-04-18 DIAGNOSIS — N1831 Chronic kidney disease, stage 3a: Secondary | ICD-10-CM | POA: Diagnosis not present

## 2023-04-18 DIAGNOSIS — Z9181 History of falling: Secondary | ICD-10-CM | POA: Diagnosis not present

## 2023-04-18 DIAGNOSIS — E1140 Type 2 diabetes mellitus with diabetic neuropathy, unspecified: Secondary | ICD-10-CM | POA: Diagnosis not present

## 2023-04-18 DIAGNOSIS — M545 Low back pain, unspecified: Secondary | ICD-10-CM | POA: Diagnosis not present

## 2023-04-18 DIAGNOSIS — M79604 Pain in right leg: Secondary | ICD-10-CM | POA: Diagnosis not present

## 2023-04-18 DIAGNOSIS — F3342 Major depressive disorder, recurrent, in full remission: Secondary | ICD-10-CM | POA: Diagnosis not present

## 2023-04-18 DIAGNOSIS — M79605 Pain in left leg: Secondary | ICD-10-CM | POA: Diagnosis not present

## 2023-04-18 DIAGNOSIS — Z79899 Other long term (current) drug therapy: Secondary | ICD-10-CM | POA: Diagnosis not present

## 2023-04-18 DIAGNOSIS — R768 Other specified abnormal immunological findings in serum: Secondary | ICD-10-CM | POA: Diagnosis not present

## 2023-04-22 DIAGNOSIS — Z79899 Other long term (current) drug therapy: Secondary | ICD-10-CM | POA: Diagnosis not present

## 2023-04-23 ENCOUNTER — Other Ambulatory Visit: Payer: Self-pay

## 2023-04-23 ENCOUNTER — Encounter: Payer: Self-pay | Admitting: Internal Medicine

## 2023-04-23 DIAGNOSIS — E1142 Type 2 diabetes mellitus with diabetic polyneuropathy: Secondary | ICD-10-CM

## 2023-04-23 MED ORDER — FREESTYLE LITE TEST VI STRP: 1.0000 | ORAL_STRIP | Freq: Four times a day (QID) | 3 refills | Status: DC

## 2023-04-23 MED ORDER — FREESTYLE LITE W/DEVICE KIT: PACK | 0 refills | Status: AC

## 2023-04-23 MED ORDER — FREESTYLE LANCETS MISC: 3 refills | Status: DC

## 2023-04-27 ENCOUNTER — Ambulatory Visit: Payer: Medicare Other | Admitting: Nurse Practitioner

## 2023-04-29 DIAGNOSIS — E1122 Type 2 diabetes mellitus with diabetic chronic kidney disease: Secondary | ICD-10-CM | POA: Diagnosis not present

## 2023-04-29 DIAGNOSIS — D631 Anemia in chronic kidney disease: Secondary | ICD-10-CM | POA: Diagnosis not present

## 2023-04-29 DIAGNOSIS — N2581 Secondary hyperparathyroidism of renal origin: Secondary | ICD-10-CM | POA: Diagnosis not present

## 2023-04-29 DIAGNOSIS — N1831 Chronic kidney disease, stage 3a: Secondary | ICD-10-CM | POA: Diagnosis not present

## 2023-04-29 DIAGNOSIS — I129 Hypertensive chronic kidney disease with stage 1 through stage 4 chronic kidney disease, or unspecified chronic kidney disease: Secondary | ICD-10-CM | POA: Diagnosis not present

## 2023-04-30 LAB — LAB REPORT - SCANNED: Creatinine, POC: 51.2 mg/dL

## 2023-05-01 ENCOUNTER — Telehealth: Payer: Self-pay | Admitting: Family

## 2023-05-01 NOTE — Telephone Encounter (Signed)
 Courtney [name]

## 2023-05-05 DIAGNOSIS — M549 Dorsalgia, unspecified: Secondary | ICD-10-CM | POA: Diagnosis not present

## 2023-05-05 DIAGNOSIS — E1169 Type 2 diabetes mellitus with other specified complication: Secondary | ICD-10-CM | POA: Diagnosis not present

## 2023-05-05 DIAGNOSIS — M1990 Unspecified osteoarthritis, unspecified site: Secondary | ICD-10-CM | POA: Diagnosis not present

## 2023-05-05 DIAGNOSIS — M542 Cervicalgia: Secondary | ICD-10-CM | POA: Diagnosis not present

## 2023-05-05 DIAGNOSIS — M25569 Pain in unspecified knee: Secondary | ICD-10-CM | POA: Diagnosis not present

## 2023-05-05 DIAGNOSIS — M79642 Pain in left hand: Secondary | ICD-10-CM | POA: Diagnosis not present

## 2023-05-05 DIAGNOSIS — M79643 Pain in unspecified hand: Secondary | ICD-10-CM | POA: Diagnosis not present

## 2023-05-05 DIAGNOSIS — M25559 Pain in unspecified hip: Secondary | ICD-10-CM | POA: Diagnosis not present

## 2023-05-05 DIAGNOSIS — M25551 Pain in right hip: Secondary | ICD-10-CM | POA: Diagnosis not present

## 2023-05-05 DIAGNOSIS — M25552 Pain in left hip: Secondary | ICD-10-CM | POA: Diagnosis not present

## 2023-05-05 DIAGNOSIS — M79671 Pain in right foot: Secondary | ICD-10-CM | POA: Diagnosis not present

## 2023-05-05 DIAGNOSIS — M79641 Pain in right hand: Secondary | ICD-10-CM | POA: Diagnosis not present

## 2023-05-05 DIAGNOSIS — M2550 Pain in unspecified joint: Secondary | ICD-10-CM | POA: Diagnosis not present

## 2023-05-05 DIAGNOSIS — M25561 Pain in right knee: Secondary | ICD-10-CM | POA: Diagnosis not present

## 2023-05-05 DIAGNOSIS — M25512 Pain in left shoulder: Secondary | ICD-10-CM | POA: Diagnosis not present

## 2023-05-05 DIAGNOSIS — M79672 Pain in left foot: Secondary | ICD-10-CM | POA: Diagnosis not present

## 2023-05-05 DIAGNOSIS — R682 Dry mouth, unspecified: Secondary | ICD-10-CM | POA: Diagnosis not present

## 2023-05-05 DIAGNOSIS — M3500 Sicca syndrome, unspecified: Secondary | ICD-10-CM | POA: Diagnosis not present

## 2023-05-05 DIAGNOSIS — H04129 Dry eye syndrome of unspecified lacrimal gland: Secondary | ICD-10-CM | POA: Diagnosis not present

## 2023-05-05 DIAGNOSIS — Z79899 Other long term (current) drug therapy: Secondary | ICD-10-CM | POA: Diagnosis not present

## 2023-05-05 DIAGNOSIS — M25562 Pain in left knee: Secondary | ICD-10-CM | POA: Diagnosis not present

## 2023-05-05 DIAGNOSIS — M25511 Pain in right shoulder: Secondary | ICD-10-CM | POA: Diagnosis not present

## 2023-05-10 NOTE — Progress Notes (Addendum)
 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   Bowel obstruction (HCC)    Depression with anxiety 02/09/2015   mild xanax prn   Diabetes mellitus (HCC) 07/18/2015   type 2   Diverticulitis    Dysrhythmia    abnormal ekgs worked up   Essential tremor 03/31/2016   Fibromyalgia    MS   Gait difficulty 02/09/2015   Gallstones    GERD (gastroesophageal reflux disease)    HLD (hyperlipidemia)    Hypersomnia 11/09/2015   Hypertension    no meds   Hyperthyroidism    patient states has no problems at present-01/25/2019   Hypothyroidism    patient states no problems at present 01/25/2019   IBS (irritable bowel syndrome)    Insomnia 02/09/2015   Migraines    MS (multiple sclerosis) (HCC)    Multiple joint pain 03/31/2016   Numbness in both hands 03/18/2016   Other fatigue 02/09/2015   Peptic ulcer    Pneumonia    multiple times. Last time 2014   PONV (postoperative nausea and vomiting)    Urinary hesitancy 02/09/2015   Vision abnormalities     History of Present Illness  Courtney [name] is a 68 y.o. female with PMH of chest pain with multiple LHC's performed that were normal,HTN, HLD, DM2, Multiple Sclerosis (2009 in [place]; no radiologist evidence on MRI 2018 & 2021), hypothyroidism, CKD stage IIIa, who presents today for 1 year follow-up.  Courtney [name] was last seen on 05/02/2022 for annual follow-up.  During that time she was doing well with no new cardiac complaints.  Her blood pressure was well-controlled and she reported exercising with stationary bike and walking frequently.  She also reported  no exacerbations of her MS since her previous follow-up.  She underwent a laparoscopic ventral hernia repair in 05/2022 and was admitted on 11/13/2022 to 11/17/2022 with complaint of abdominal pain.  She was found to have a small bowel obstruction and had an NG tube placed and was found to be secondary to adhesions.  She was consulted by general surgery and Gastrografin/SBO protocol was initiated.  She was slowly advanced with her diet and discharged in stable condition.  She was recently seen at [facility] on 05/01/2023 for 69-month follow-up.  He was recently referred to rheumatology due to positive Sjogren's antibodies.  Courtney [name] presents today for annual follow-up with her husband.  She reports since her previous visit that she has been doing well but does note decreased stamina with activity.  She continues to stay active and rides her exercise bike when feeling up to it.  Her blood pressure today was stable at 128/68 and heart rate was 72 bpm.  She reports compliance with her current medication regimen and denies any adverse reactions.  She was previously evaluated by rheumatology and was started on hydroxy chloroquine for management of symptoms.  She reports that her blood sugars have been stable however her A1c was elevated at 7.1.  She also reports some indiscretions with her diet and cholesterol numbers have been elevated over the past 2 years.  Patient denies chest pain, palpitations, dyspnea, PND, orthopnea, nausea, vomiting, dizziness, syncope, edema, weight gain, or early satiety.   Review of Systems  Please see the history of present illness.    All other systems reviewed and are otherwise negative except as noted above.  Physical Exam    Wt Readings from Last 3 Encounters:  01/08/23 156 lb (70.8 kg)  11/26/22 159 lb (72.1 kg)  11/17/22 153 lb 10.6 oz (69.7 kg)   HK:VQQVZ were no vitals filed for this visit.,There is no height or weight on file to calculate BMI. GEN: Well  nourished, well developed in no acute distress Neck: No JVD; No carotid bruits Pulmonary: Clear to auscultation without rales, wheezing or rhonchi  Cardiovascular: Normal rate. Regular rhythm. Normal S1. Normal S2.   Murmurs: There is no murmur.  ABDOMEN: Soft, non-tender, non-distended EXTREMITIES:  No edema; No deformity   EKG/LABS/ Recent Cardiac Studies   ECG personally reviewed by me today -none completed today  Risk Assessment/Calculations:      Lab Results  Component Value Date   WBC 7.6 11/26/2022   HGB 12.6 11/26/2022   HCT 40.8 11/26/2022   MCV 90 11/26/2022   PLT 167 11/26/2022   Lab Results  Component Value Date   CREATININE 1.08 01/08/2023   BUN 18 01/08/2023   NA 138 01/08/2023   K 3.9 01/08/2023   CL 101 01/08/2023   CO2 30 01/08/2023   Lab Results  Component Value Date   CHOL 197 05/03/2021   HDL 79 05/03/2021   LDLCALC 89 05/03/2021   TRIG 177 (H) 05/03/2021   CHOLHDL 2.5 05/03/2021    Lab Results  Component Value Date   HGBA1C 6.7 (A) 06/19/2022   Assessment & Plan    1.  Essential HTN: -Patient's blood pressure today was stable at 128/68 -Continue low-sodium heart healthy diet  2.  HLD: -Patient is statin intolerant -Patient's last LDL cholesterol was elevated when last checked in 2023 at 123 -We will check LFTs and lipids -Patient is aware that if cholesterol numbers are elevated we will add ezetimibe 10 mg to current regimen -She was also advised to exercise at least 150 minutes/week and decrease intake of foods high in saturated fat  3.  DM type II: -Patient's last hemoglobin A1c was elevated at 7.1 -Continue current treatment plan per PCP  4.  CKD stage IIIa: -Currently followed by nephrology -Most recent creatinine was 1.08  5. History of MS: -no radiologist evidence on MRI 2018 & 2021), -Continue exercise 15 minutes a day for total of 150 minutes/week  6.  Fatigue and shortness of breath: -Patient reports decreased stamina  with normal activities. -We will check 2D echo to evaluate heart structure and function.  Disposition: Follow-up with new cardiologist or APP in 6 months   Signed, Napoleon Form, Leodis Rains, NP 05/10/2023, 3:27 PM [facility]

## 2023-05-11 ENCOUNTER — Encounter: Payer: Self-pay | Admitting: Nurse Practitioner

## 2023-05-11 ENCOUNTER — Ambulatory Visit: Payer: Medicare Other | Attending: Nurse Practitioner | Admitting: Nurse Practitioner

## 2023-05-11 ENCOUNTER — Other Ambulatory Visit: Payer: Self-pay

## 2023-05-11 VITALS — BP 128/68 | HR 72 | Ht 62.5 in | Wt 163.8 lb

## 2023-05-11 DIAGNOSIS — E785 Hyperlipidemia, unspecified: Secondary | ICD-10-CM | POA: Diagnosis not present

## 2023-05-11 DIAGNOSIS — G35 Multiple sclerosis: Secondary | ICD-10-CM

## 2023-05-11 DIAGNOSIS — I10 Essential (primary) hypertension: Secondary | ICD-10-CM | POA: Insufficient documentation

## 2023-05-11 DIAGNOSIS — N1831 Chronic kidney disease, stage 3a: Secondary | ICD-10-CM | POA: Diagnosis not present

## 2023-05-11 NOTE — Patient Instructions (Signed)
 Medication Instructions:  Your physician recommends that you continue on your current medications as directed. Please refer to the Current Medication list given to you today. *If you need a refill on your cardiac medications before your next appointment, please call your pharmacy*   Lab Work: FASTING LABS-LIPIDS & LFTs (complete at your convenience)  If you have labs (blood work) drawn today and your tests are completely normal, you will receive your results only by: MyChart Message (if you have MyChart) OR A paper copy in the mail If you have any lab test that is abnormal or we need to change your treatment, we will call you to review the results.   Testing/Procedures: Your physician has requested that you have an echocardiogram. Echocardiography is a painless test that uses sound waves to create images of your heart. It provides your doctor with information about the size and shape of your heart and how well your heart's chambers and valves are working. This procedure takes approximately one hour. There are no restrictions for this procedure. Please do NOT wear cologne, perfume, aftershave, or lotions (deodorant is allowed). Please arrive 15 minutes prior to your appointment time.  Please note: We ask at that you not bring children with you during ultrasound (echo/ vascular) testing. Due to room size and safety concerns, children are not allowed in the ultrasound rooms during exams. Our front office staff cannot provide observation of children in our lobby area while testing is being conducted. An adult accompanying a patient to their appointment will only be allowed in the ultrasound room at the discretion of the ultrasound technician under special circumstances. We apologize for any inconvenience.   Follow-Up: At [facility], you and your health needs are our priority.  As part of our continuing mission to provide you with exceptional heart care, we have created designated  Provider Care Teams.  These Care Teams include your primary Cardiologist (physician) and Advanced Practice Providers (APPs -  Physician Assistants and Nurse Practitioners) who all work together to provide you with the care you need, when you need it.  We recommend signing up for the patient portal called "MyChart".  Sign up information is provided on this After Visit Summary.  MyChart is used to connect with patients for Virtual Visits (Telemedicine).  Patients are able to view lab/test results, encounter notes, upcoming appointments, etc.  Non-urgent messages can be sent to your provider as well.   To learn more about what you can do with MyChart, go to [email or web address].    Your next appointment:   6 month(s)  Provider:   Casimer Bilis  Other Instructions

## 2023-05-19 DIAGNOSIS — F331 Major depressive disorder, recurrent, moderate: Secondary | ICD-10-CM | POA: Diagnosis not present

## 2023-05-19 DIAGNOSIS — G35 Multiple sclerosis: Secondary | ICD-10-CM | POA: Diagnosis not present

## 2023-05-19 DIAGNOSIS — G8929 Other chronic pain: Secondary | ICD-10-CM | POA: Diagnosis not present

## 2023-05-19 DIAGNOSIS — Z79899 Other long term (current) drug therapy: Secondary | ICD-10-CM | POA: Diagnosis not present

## 2023-05-19 DIAGNOSIS — Z794 Long term (current) use of insulin: Secondary | ICD-10-CM | POA: Diagnosis not present

## 2023-05-19 DIAGNOSIS — E1140 Type 2 diabetes mellitus with diabetic neuropathy, unspecified: Secondary | ICD-10-CM | POA: Diagnosis not present

## 2023-05-19 DIAGNOSIS — N1831 Chronic kidney disease, stage 3a: Secondary | ICD-10-CM | POA: Diagnosis not present

## 2023-05-19 DIAGNOSIS — M79604 Pain in right leg: Secondary | ICD-10-CM | POA: Diagnosis not present

## 2023-05-19 DIAGNOSIS — M549 Dorsalgia, unspecified: Secondary | ICD-10-CM | POA: Diagnosis not present

## 2023-05-19 DIAGNOSIS — E663 Overweight: Secondary | ICD-10-CM | POA: Diagnosis not present

## 2023-05-19 DIAGNOSIS — M79605 Pain in left leg: Secondary | ICD-10-CM | POA: Diagnosis not present

## 2023-05-19 DIAGNOSIS — Z9181 History of falling: Secondary | ICD-10-CM | POA: Diagnosis not present

## 2023-05-22 DIAGNOSIS — Z79899 Other long term (current) drug therapy: Secondary | ICD-10-CM | POA: Diagnosis not present

## 2023-05-25 ENCOUNTER — Telehealth: Payer: Self-pay | Admitting: Family

## 2023-05-25 NOTE — Telephone Encounter (Unsigned)
 Copied from CRM [id number]. Topic: General - Other >> May 25, 2023  2:56 PM Turkey B wrote: Reason for CRM: pt has lab from from [facility], but I let her know she had to have orders put in to have labs drawn here, she says for trygicerides and cholesterol

## 2023-05-26 NOTE — Telephone Encounter (Signed)
 Patient established with [facility] at [facility] for management of hyperlipidemia and related chronic conditions. Follow-up with the same.

## 2023-06-02 ENCOUNTER — Other Ambulatory Visit: Payer: Self-pay | Admitting: Internal Medicine

## 2023-06-03 ENCOUNTER — Telehealth: Payer: Self-pay

## 2023-06-03 ENCOUNTER — Ambulatory Visit (INDEPENDENT_AMBULATORY_CARE_PROVIDER_SITE_OTHER)

## 2023-06-03 ENCOUNTER — Ambulatory Visit (HOSPITAL_COMMUNITY): Attending: Cardiology

## 2023-06-03 DIAGNOSIS — I471 Supraventricular tachycardia, unspecified: Secondary | ICD-10-CM | POA: Insufficient documentation

## 2023-06-03 DIAGNOSIS — I10 Essential (primary) hypertension: Secondary | ICD-10-CM | POA: Insufficient documentation

## 2023-06-03 DIAGNOSIS — G35 Multiple sclerosis: Secondary | ICD-10-CM | POA: Diagnosis not present

## 2023-06-03 DIAGNOSIS — I422 Other hypertrophic cardiomyopathy: Secondary | ICD-10-CM

## 2023-06-03 DIAGNOSIS — N1831 Chronic kidney disease, stage 3a: Secondary | ICD-10-CM | POA: Insufficient documentation

## 2023-06-03 DIAGNOSIS — E785 Hyperlipidemia, unspecified: Secondary | ICD-10-CM | POA: Diagnosis not present

## 2023-06-03 LAB — LIPID PANEL
Chol/HDL Ratio: 2.5 ratio (ref 0.0–4.4)
Cholesterol, Total: 206 mg/dL — ABNORMAL HIGH (ref 100–199)
HDL: 84 mg/dL (ref >39)
LDL Chol Calc (NIH): 89 mg/dL (ref 0–99)
Triglycerides: 199 mg/dL — ABNORMAL HIGH (ref 0–149)
VLDL Cholesterol Cal: 33 mg/dL (ref 5–40)

## 2023-06-03 LAB — ECHOCARDIOGRAM COMPLETE
Area-P 1/2: 4.49 cm2
S' Lateral: 2.3 cm

## 2023-06-03 LAB — HEPATIC FUNCTION PANEL
ALT: 13 IU/L (ref 0–32)
AST: 24 IU/L (ref 0–40)
Albumin: 4.8 g/dL (ref 3.9–4.9)
Alkaline Phosphatase: 92 IU/L (ref 44–121)
Bilirubin Total: 0.6 mg/dL (ref 0.0–1.2)
Bilirubin, Direct: 0.19 mg/dL (ref 0.00–0.40)
Total Protein: 7.7 g/dL (ref 6.0–8.5)

## 2023-06-03 MED ORDER — PERFLUTREN LIPID MICROSPHERE
1.0000 mL | INTRAVENOUS | Status: AC | PRN
  Administered 2023-06-03: 1 mL via INTRAVENOUS

## 2023-06-03 NOTE — Telephone Encounter (Addendum)
 Patient has been notified and voiced understanding.   Zio has been ordered.   I also explained to the patient that at her next appt with Dr Izora Ribas details of the test will be discussed and if she had any questions about her results she can always send a mychart message. She voiced understanding.

## 2023-06-03 NOTE — Progress Notes (Unsigned)
 Enrolled patient for a 14 day Zio XT monitor to be mailed to patients home  Courtney [name]  [id number] mailed to patient and applied in office.

## 2023-06-03 NOTE — Addendum Note (Signed)
 Addended by: Alveta Heimlich on: 06/03/2023 05:23 PM   Modules accepted: Orders

## 2023-06-03 NOTE — Telephone Encounter (Signed)
 The patient states her blood pressure runs lows and wants to know if she should take metoprolol. She states she was on metoprolol in the past and wants to know if she should still take this medication?

## 2023-06-03 NOTE — Telephone Encounter (Signed)
 Please contact Ms. Conley-Keys and advised her to hold off on starting the metoprolol at this time.  Please order patient a 14-day ZIO monitor with the indication of short PR to evaluate for SVT per Dr. Raynelle Jan.  Please let me know if have any further questions.

## 2023-06-04 NOTE — Addendum Note (Signed)
 Addended by: Alveta Heimlich on: 06/04/2023 09:58 AM   Modules accepted: Orders

## 2023-06-08 ENCOUNTER — Ambulatory Visit: Attending: Cardiovascular Disease

## 2023-06-08 ENCOUNTER — Telehealth: Payer: Self-pay | Admitting: Nurse Practitioner

## 2023-06-08 NOTE — Telephone Encounter (Signed)
 Pt would like to come in office to have heart monitor put on for her. Please advise

## 2023-06-08 NOTE — Telephone Encounter (Signed)
 Scheduled for patient to come in 06/08/23, 1:30PM to have ZIO monitor applied.

## 2023-06-11 DIAGNOSIS — F411 Generalized anxiety disorder: Secondary | ICD-10-CM | POA: Diagnosis not present

## 2023-06-11 DIAGNOSIS — F330 Major depressive disorder, recurrent, mild: Secondary | ICD-10-CM | POA: Diagnosis not present

## 2023-06-11 DIAGNOSIS — F4321 Adjustment disorder with depressed mood: Secondary | ICD-10-CM | POA: Diagnosis not present

## 2023-06-15 ENCOUNTER — Encounter: Payer: Self-pay | Admitting: Internal Medicine

## 2023-06-15 ENCOUNTER — Ambulatory Visit (INDEPENDENT_AMBULATORY_CARE_PROVIDER_SITE_OTHER): Payer: Medicare Other | Admitting: Internal Medicine

## 2023-06-15 VITALS — BP 130/80 | HR 71 | Ht 62.5 in | Wt 161.0 lb

## 2023-06-15 DIAGNOSIS — E1142 Type 2 diabetes mellitus with diabetic polyneuropathy: Secondary | ICD-10-CM

## 2023-06-15 DIAGNOSIS — E1122 Type 2 diabetes mellitus with diabetic chronic kidney disease: Secondary | ICD-10-CM | POA: Diagnosis not present

## 2023-06-15 DIAGNOSIS — N1831 Chronic kidney disease, stage 3a: Secondary | ICD-10-CM

## 2023-06-15 DIAGNOSIS — Z794 Long term (current) use of insulin: Secondary | ICD-10-CM

## 2023-06-15 LAB — POCT GLYCOSYLATED HEMOGLOBIN (HGB A1C): Hemoglobin A1C: 6.9 % — AB (ref 4.0–5.6)

## 2023-06-15 NOTE — Progress Notes (Signed)
 Name: Courtney [name]  Age/ Sex: 68 y.o., female   MRN/ DOB: [id number], 04/21/1955     PCP: Rema Fendt, NP   Reason for Endocrinology Evaluation: Type 2 Diabetes Mellitus  Initial Endocrine Consultative Visit: 06/20/2020    PATIENT IDENTIFIER: [name] is a 68 y.o. female with a past medical history of T2DM, MS , HTN and essential tremors. The patient has followed with Endocrinology clinic since 06/20/20 for consultative assistance with management of her diabetes.  DIABETIC HISTORY:  [name] was diagnosed with DM in 2008, Victoza caused nausea, Bydureon . Has been on insulin since 2011. Her hemoglobin A1c has ranged from 7.3% in 2017, peaking at 8.0% in years ago.   On her initial visit to our clinic she had an A1c of 6.4% , she was on Jardiance and novolog mix . She was having recurrent hypoglycemia at the time, we reduced insulin and continued Jardiance    Started Trulicity 08/2021 was stopped by 12/2021 due to complaints of loss of appetite and borborygmi  but opted to go back 04/2022  Ozempic caused pruritus but this continued despite being off Ozempic for over 6 weeks, stopped Jardiance 06/2022 to see if this would help with pruritus     SUBJECTIVE:   During the last visit (12/15/2022): This was a virtual visit      Today (06/19/2022): [name] is here for a follow up on diabetes management.  She checks her blood sugars multiple times daily through CGM.  The patient has not been noted with hypoglycemia, but she does endorse inaccurate readings on the Dexcom compared to fingerstick.   She did have follow-up with cardiology 05/2023 for HTN and dyslipidemia, echocardiogram ordered She was seen by Dr. Lajoyce Corners for low back pain  She hates  insulin injections  Has occasional abdominal pain, no nausea or vomiting  Denies diarrhea  but has constipation that she attributes to oxycodone   HOME DIABETES REGIMEN:  Jardiance 25 mg  daily Lantus 12 units daily- has been taking  lantus 10 units  NovoLog 10/6/8 units  Novolog 2-4 units with a snack  CF: NovoLog (BG -130/40) 3 times daily before every meal   Statin: no ACE-I/ARB: no Prior Diabetic Education: no   CONTINUOUS GLUCOSE MONITORING RECORD INTERPRETATION    Dates of Recording: 4/1-4/14/2025  Sensor description:dexcom  Results statistics:   CGM use % of time 92  Average and SD 150/34  Time in range 82%  % Time Above 180 18  % Time above 250 0  % Time Below target 0   Glycemic patterns summary: BGs are optimal throughout the day and night  Hyperglycemic episodes  postprandial   Hypoglycemic episodes occurred N/A in the past 2 weeks  Overnight periods: Optimal      DIABETIC COMPLICATIONS: Microvascular complications:  Neuropathy Denies: retinopathy, CKD Last Eye Exam: Completed 02/05/2021  Macrovascular complications:   Denies: CAD, CVA, PVD   HISTORY:  Past Medical History:  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   Bowel obstruction (HCC)    Depression with anxiety 02/09/2015   mild xanax prn   Diabetes mellitus (HCC) 07/18/2015   type 2   Diverticulitis    Dysrhythmia    abnormal ekgs worked up  Essential tremor 03/31/2016   Fibromyalgia    MS   Gait difficulty 02/09/2015   Gallstones    GERD (gastroesophageal reflux disease)    HLD (hyperlipidemia)    Hypersomnia 11/09/2015   Hypertension    no meds   Hyperthyroidism    patient states has no problems at present-01/25/2019   Hypothyroidism    patient states no problems at present 01/25/2019   IBS (irritable bowel syndrome)    Insomnia 02/09/2015   Migraines    MS (multiple sclerosis) (HCC)    Multiple joint pain 03/31/2016   Numbness in both hands 03/18/2016   Other fatigue 02/09/2015   Peptic ulcer     Pneumonia    multiple times. Last time 2014   PONV (postoperative nausea and vomiting)    Urinary hesitancy 02/09/2015   Vision abnormalities    Past Surgical History:  Past Surgical History:  Procedure Laterality Date   ABDOMINAL HYSTERECTOMY  1976   including oophorectomy, due to uterine cancer   APPENDECTOMY  1974   BIOPSY  01/31/2019   Procedure: BIOPSY;  Surgeon: Sergio Dandy, MD;  Location: [facility];  Service: Endoscopy;;   CARDIAC CATHETERIZATION     2001/2006 In [place]. Normal per patient   CARPAL TUNNEL RELEASE Left    carpal tunnel surgery   CHOLECYSTECTOMY     2006   COLON SURGERY     [facility] x 2   COLONOSCOPY WITH PROPOFOL N/A 01/31/2019   Procedure: COLONOSCOPY WITH PROPOFOL;  Surgeon: Sergio Dandy, MD;  Location: [facility];  Service: Endoscopy;  Laterality: N/A;   COLONOSCOPY WITH PROPOFOL N/A 04/24/2022   Procedure: COLONOSCOPY WITH PROPOFOL;  Surgeon: Sergio Dandy, MD;  Location: [facility];  Service: Gastroenterology;  Laterality: N/A;   EUS N/A 04/09/2017   Procedure: UPPER ENDOSCOPIC ULTRASOUND (EUS) LINEAR;  Surgeon: Janel Medford, MD;  Location: [facility];  Service: Endoscopy;  Laterality: N/A;   HERNIA REPAIR     INCISIONAL HERNIA REPAIR N/A 05/05/2022   Procedure: LAPAROSCOPIC VENTRAL HERNA REPAIR WITH MESH;  Surgeon: Lujean Sake, MD;  Location: [facility] OR;  Service: General;  Laterality: N/A;   INSERTION OF MESH N/A 05/05/2022   Procedure: INSERTION OF MESH;  Surgeon: Lujean Sake, MD;  Location: [facility] OR;  Service: General;  Laterality: N/A;   MENISCUS REPAIR Left    POLYPECTOMY  01/31/2019   Procedure: POLYPECTOMY;  Surgeon: Sergio Dandy, MD;  Location: [facility];  Service: Endoscopy;;   POLYPECTOMY  04/24/2022   Procedure: POLYPECTOMY;  Surgeon: Sergio Dandy, MD;  Location: [facility];  Service: Gastroenterology;;   SMALL INTESTINE SURGERY     toenail removal Right 12/2017   great toenail    Social  History:  reports that she quit smoking about 35 years ago. Her smoking use included cigarettes. She started smoking about 40 years ago. She has a 1.3 pack-year smoking history. She has never been exposed to tobacco smoke. She has never used smokeless tobacco. She reports that she does not currently use alcohol. She reports current drug use. Drug: Oxycodone. Family History:  Family History  Problem Relation Age of Onset   Heart disease Mother    Kidney disease Mother    Diabetes Mother    Other Father 34       murdered   Multiple sclerosis Daughter    Multiple sclerosis Other    Diabetes Sister    Alcohol abuse Brother        ETOH  and marijuana   Diabetes Brother    Diabetes Sister    Diabetes Brother    ALS Brother      HOME MEDICATIONS: Allergies as of 06/15/2023       Reactions   Ibuprofen Other (See Comments)   Developed a ulcer    Methylprednisolone Anaphylaxis, Shortness Of Breath, Palpitations   Naproxen Other (See Comments)   Developed a ulcer    Nsaids Other (See Comments)   Pt gets Ulcers    Prednisone Anaphylaxis, Shortness Of Breath, Palpitations   Bactrim [sulfamethoxazole-trimethoprim] Other (See Comments)   "blotchiness" redness, face swelling   Biaxin [clarithromycin] Swelling   Sulfa Antibiotics Swelling, Other (See Comments)   Angioedema   Ozempic (0.25 Or 0.5 Mg-dose) [semaglutide(0.25 Or 0.5mg -dos)] Rash        Medication List        Accurate as of June 15, 2023  1:34 PM. If you have any questions, ask your nurse or doctor.          acetaminophen 500 MG tablet Commonly known as: TYLENOL Take 1,000 mg by mouth every 8 (eight) hours as needed for headache.   ALPRAZolam 1 MG tablet Commonly known as: XANAX Take 1 mg by mouth 3 (three) times daily as needed for anxiety.   cyclobenzaprine 10 MG tablet Commonly known as: FLEXERIL Take 10 mg by mouth 3 (three) times daily as needed for muscle spasms.   Dexcom G7 Sensor Misc 1 Device by  Does not apply route as directed.   fluconazole 100 MG tablet Commonly known as: DIFLUCAN Take 1 tablet (100 mg total) by mouth daily.   freestyle lancets Use to check blood sugars up to 4 times daily   FREESTYLE LITE test strip Generic drug: glucose blood 1 each by Other route in the morning, at noon, in the evening, and at bedtime. Use as instructed   FreeStyle Lite w/Device Kit Use to check blood sugars up to 4 times daily   hydroxychloroquine 200 MG tablet Commonly known as: PLAQUENIL Take 300 mg by mouth daily.   Insulin Pen Needle 32G X 4 MM Misc 1 Device by Does not apply route in the morning, at noon, in the evening, and at bedtime.   Jardiance 25 MG Tabs tablet Generic drug: empagliflozin TAKE 1 TABLET DAILY   Lantus SoloStar 100 UNIT/ML Solostar Pen Generic drug: insulin glargine Inject 12 Units into the skin daily.   latanoprost 0.005 % ophthalmic solution Commonly known as: XALATAN Place 1 drop into both eyes at bedtime.   loratadine 10 MG tablet Commonly known as: CLARITIN Take 10 mg by mouth daily as needed for allergies.   modafinil 100 MG tablet Commonly known as: PROVIGIL Take 25 mg by mouth daily as needed (Takes to help stay awake due to insomnia per patient).   mupirocin ointment 2 % Commonly known as: BACTROBAN Apply 1 Application topically 2 (two) times daily.   naloxone 4 MG/0.1ML Liqd nasal spray kit Commonly known as: NARCAN Place 1 spray into the nose daily as needed (overdose).   NovoLOG FlexPen 100 UNIT/ML FlexPen Generic drug: insulin aspart Max daily 30 units What changed:  how much to take how to take this when to take this additional instructions   oxyCODONE 15 MG immediate release tablet Commonly known as: ROXICODONE Take 15 mg by mouth every 6 (six) hours as needed for pain.   pantoprazole 40 MG tablet Commonly known as: PROTONIX Take 1 tablet (40 mg total) by mouth daily.  terconazole 0.8 % vaginal cream Commonly  known as: TERAZOL 3 Place 1 applicator vaginally at bedtime.   Vitamin D (Ergocalciferol) 1.25 MG (50000 UNIT) Caps capsule Commonly known as: DRISDOL Take 50,000 Units by mouth once a week.         OBJECTIVE:   Vital Signs: BP 130/80   Pulse 71   Ht 5' 2.5" (1.588 m)   Wt 161 lb (73 kg)   SpO2 96%   BMI 28.98 kg/m   Wt Readings from Last 3 Encounters:  06/15/23 161 lb (73 kg)  05/11/23 163 lb 12.8 oz (74.3 kg)  01/08/23 156 lb (70.8 kg)    Exam: General: Pt appears well and is in NAD  Neck: General: Supple without adenopathy. Thyroid: Thyroid size normal.  No goiter or nodules appreciated.   Lungs: Clear with good BS bilat   Heart: RRR   Extremities: No pretibial edema.   Neuro: MS is good with appropriate affect, pt is alert and Ox3      DM Foot Exam 06/15/2023   The skin of the feet is intact without sores or ulcerations. The pedal pulses are 2+ on right and 2+ on left. The sensation is intact to a screening 5.07, 10 gram monofilament bilaterally    DATA REVIEWED:  Lab Results  Component Value Date   HGBA1C 6.9 (A) 06/15/2023   HGBA1C 6.7 (A) 06/19/2022   HGBA1C 7.2 (A) 04/08/2022    Latest Reference Range & Units 01/08/23 16:19  Sodium 135 - 145 mEq/L 138  Potassium 3.5 - 5.1 mEq/L 3.9  Chloride 96 - 112 mEq/L 101  CO2 19 - 32 mEq/L 30  Glucose 70 - 99 mg/dL 176 (H)  BUN 6 - 23 mg/dL 18  Creatinine 1.60 - 7.37 mg/dL 1.06  Calcium 8.4 - 26.9 mg/dL 9.7  GFR >48.54 mL/min 52.94 (L)    Latest Reference Range & Units 06/03/23 10:07  Total CHOL/HDL Ratio 0.0 - 4.4 ratio 2.5  Cholesterol, Total 100 - 199 mg/dL 627 (H)  HDL Cholesterol >39 mg/dL 84  Triglycerides 0 - 035 mg/dL 009 (H)  VLDL Cholesterol Cal 5 - 40 mg/dL 33  LDL Chol Calc (NIH) 0 - 99 mg/dL 89     Old records , labs and images have been reviewed.    ASSESSMENT / PLAN / RECOMMENDATIONS:   1) Type 2 Diabetes Mellitus, Optimally controlled, With neuropathic and CKD III  complications - A1c 6.9%, Goal A1c < 7.0 %.    -A1c remains at goal -She was on Trulicity at some point, and she endorsed nonspecific symptoms to it, in hindsight she believes this had to do with hernia rather than the actual Trulicity.  She is not a good candidate for GLP-1 agonist due to her diagnosis of gastroparesis - She continues to complain of glucose fluctuation, she did forget her Lantus last night, I did ask her to switch in the morning - Patient states that she hates injections, we did briefly mention insulin pump technology, she will be asked to check on OmniPod 5 and tandem - I will increase her Lantus, as she did not increase it based on last visit recommendation - I will decrease NovoLog with breakfast as she has been noted with hypoglycemia after breakfast  MEDICATIONS: - Continue Jardiance 25 mg 1 tablet daily  - Increase Lantus 12 units daily - Change  NovoLog 8 units with breakfast, 6 units with lunch, 8 units with supper - Continue CF: NovoLog (BG -130/40) 3 times daily  before every meal - Novolog 2-4 units with a snack       EDUCATION / INSTRUCTIONS: BG monitoring instructions: Patient is instructed to check her blood sugars 2 times a day, breakfast and supper. Call [facility] if: BG persistently < 70  I reviewed the Rule of 15 for the treatment of hypoglycemia in detail with the patient. Literature supplied.   2) Diabetic complications:  Eye: Does not have known diabetic retinopathy.  Neuro/ Feet: Does  have known diabetic peripheral neuropathy .  Renal: Patient does not have known baseline CKD. She   is not on an ACEI/ARB at present.     3) Lipids: Patient is has been off statins due to severe cramps.    F/U in 6 months  I spent 25 minutes preparing to see the patient by review of recent labs, imaging and procedures, obtaining and reviewing separately obtained history, communicating with the patient, ordering medications, tests or  procedures, and documenting clinical information in the EHR including the differential Dx, treatment, and any further evaluation and other management    Signed electronically by: Natale Bail, MD  [facility]  [facility] [address] [place] [place], [place] [address] Phone: [phone] FAX: [phone]   CC: Senaida Dama, NP [address] [place] [address] Phone: [phone]  Fax: [phone]  Return to Endocrinology clinic as below: Future Appointments  Date Time Provider Department Center  09/07/2023  2:00 PM Jann Melody, MD [facility]  09/07/2023  3:00 PM Ricarda Challenger, PhD [facility]

## 2023-06-15 NOTE — Patient Instructions (Addendum)
 Lantus 12 units once daily every morning  Novolog  8 units before Breakfast, 6 units with lunch and 8 units with Supper  Novolog correctional insulin: ADD extra units on insulin to your meal-time Novolog dose if your blood sugars are higher than 170. Use the scale below to help guide you before each meal   Blood sugar before meal Number of units to inject  Less than 170 0 unit  171 -  210 1 units  211 -  250 2 units  251 -  290 3 units  291 -  330 4 units  331 -  370 5 units  371 -  410 6 units    Check out Omnipod5 and T-slim/Tandem insulin pumps  HOW TO TREAT LOW BLOOD SUGARS (Blood sugar LESS THAN 70 MG/DL) Please follow the RULE OF 15 for the treatment of hypoglycemia treatment (when your (blood sugars are less than 70 mg/dL)   STEP 1: Take 15 grams of carbohydrates when your blood sugar is low, which includes:  3-4 GLUCOSE TABS  OR 3-4 OZ OF JUICE OR REGULAR SODA OR ONE TUBE OF GLUCOSE GEL    STEP 2: RECHECK blood sugar in 15 MINUTES STEP 3: If your blood sugar is still low at the 15 minute recheck --> then, go back to STEP 1 and treat AGAIN with another 15 grams of carbohydrates.

## 2023-06-16 ENCOUNTER — Encounter: Payer: Self-pay | Admitting: Internal Medicine

## 2023-06-16 DIAGNOSIS — E1142 Type 2 diabetes mellitus with diabetic polyneuropathy: Secondary | ICD-10-CM

## 2023-06-16 LAB — MICROALBUMIN / CREATININE URINE RATIO
Creatinine, Urine: 67 mg/dL (ref 20–275)
Microalb Creat Ratio: 3 mg/g{creat} (ref <30)
Microalb, Ur: 0.2 mg/dL

## 2023-06-17 DIAGNOSIS — G8929 Other chronic pain: Secondary | ICD-10-CM | POA: Diagnosis not present

## 2023-06-17 DIAGNOSIS — M549 Dorsalgia, unspecified: Secondary | ICD-10-CM | POA: Diagnosis not present

## 2023-06-17 DIAGNOSIS — E6609 Other obesity due to excess calories: Secondary | ICD-10-CM | POA: Diagnosis not present

## 2023-06-17 DIAGNOSIS — Z9181 History of falling: Secondary | ICD-10-CM | POA: Diagnosis not present

## 2023-06-17 DIAGNOSIS — Z6830 Body mass index (BMI) 30.0-30.9, adult: Secondary | ICD-10-CM | POA: Diagnosis not present

## 2023-06-17 DIAGNOSIS — Z79899 Other long term (current) drug therapy: Secondary | ICD-10-CM | POA: Diagnosis not present

## 2023-06-23 ENCOUNTER — Other Ambulatory Visit: Payer: Self-pay

## 2023-06-23 MED ORDER — TIRZEPATIDE 2.5 MG/0.5ML ~~LOC~~ SOAJ: 2.5000 mg | SUBCUTANEOUS | 3 refills | Status: DC

## 2023-06-23 MED ORDER — OMNIPOD 5 G7 PODS (GEN 5) MISC: 1.0000 | 3 refills | Status: DC

## 2023-06-23 MED ORDER — OMNIPOD 5 G7 INTRO (GEN 5) KIT: 1.0000 | PACK | 0 refills | Status: DC

## 2023-06-24 ENCOUNTER — Telehealth: Payer: Self-pay

## 2023-06-24 NOTE — Telephone Encounter (Signed)
 Pharmacy Patient Advocate Encounter   Received notification from CoverMyMeds that prior authorization for mounjaro is required/requested.   Insurance verification completed.   The patient is insured through [employer] .   Per test claim: PA required; PA submitted to above mentioned insurance via CoverMyMeds Key/confirmation #/EOC [id number] Status is pending

## 2023-06-27 DIAGNOSIS — I471 Supraventricular tachycardia, unspecified: Secondary | ICD-10-CM | POA: Diagnosis not present

## 2023-06-29 ENCOUNTER — Telehealth: Payer: Self-pay | Admitting: Nutrition

## 2023-06-29 DIAGNOSIS — I471 Supraventricular tachycardia, unspecified: Secondary | ICD-10-CM | POA: Diagnosis not present

## 2023-06-29 NOTE — Telephone Encounter (Signed)
 VM left on my machine that [facility] is waiting on a prior auth for OmniPod 5.

## 2023-07-01 ENCOUNTER — Telehealth: Payer: Self-pay

## 2023-07-01 NOTE — Telephone Encounter (Signed)
 [facility] states that they are waiting for PA on Omnipod. Several request have been sent

## 2023-07-02 ENCOUNTER — Telehealth: Payer: Self-pay

## 2023-07-02 ENCOUNTER — Other Ambulatory Visit (HOSPITAL_COMMUNITY): Payer: Self-pay

## 2023-07-02 NOTE — Telephone Encounter (Signed)
 Pharmacy Patient Advocate Encounter   Received notification from Pt Calls Messages that prior authorization for Omnipod is required/requested.   Insurance verification completed.   The patient is insured through [employer] .   Per test claim: PA required and submitted KEY/EOC/Request #: [id number] APPROVED from 06/02/23 to 07/01/24

## 2023-07-07 NOTE — Telephone Encounter (Signed)
 Pharmacy Patient Advocate Encounter  Received notification from [employer] that Prior Authorization for Mounjaro has been APPROVED from 05/25/23 to 03/02/2098   PA #/Case ID/Reference #: [id number]

## 2023-07-13 ENCOUNTER — Encounter: Payer: Self-pay | Admitting: Internal Medicine

## 2023-07-13 ENCOUNTER — Other Ambulatory Visit: Payer: Self-pay | Admitting: Physician Assistant

## 2023-07-14 DIAGNOSIS — S62355D Nondisplaced fracture of shaft of fourth metacarpal bone, left hand, subsequent encounter for fracture with routine healing: Secondary | ICD-10-CM | POA: Diagnosis not present

## 2023-07-16 DIAGNOSIS — G35 Multiple sclerosis: Secondary | ICD-10-CM | POA: Diagnosis not present

## 2023-07-16 DIAGNOSIS — Z79899 Other long term (current) drug therapy: Secondary | ICD-10-CM | POA: Diagnosis not present

## 2023-07-16 DIAGNOSIS — E1140 Type 2 diabetes mellitus with diabetic neuropathy, unspecified: Secondary | ICD-10-CM | POA: Diagnosis not present

## 2023-07-16 DIAGNOSIS — F331 Major depressive disorder, recurrent, moderate: Secondary | ICD-10-CM | POA: Diagnosis not present

## 2023-07-16 DIAGNOSIS — M549 Dorsalgia, unspecified: Secondary | ICD-10-CM | POA: Diagnosis not present

## 2023-07-16 DIAGNOSIS — G8929 Other chronic pain: Secondary | ICD-10-CM | POA: Diagnosis not present

## 2023-07-16 DIAGNOSIS — N1831 Chronic kidney disease, stage 3a: Secondary | ICD-10-CM | POA: Diagnosis not present

## 2023-07-16 DIAGNOSIS — E663 Overweight: Secondary | ICD-10-CM | POA: Diagnosis not present

## 2023-07-16 DIAGNOSIS — Z794 Long term (current) use of insulin: Secondary | ICD-10-CM | POA: Diagnosis not present

## 2023-07-16 DIAGNOSIS — M79604 Pain in right leg: Secondary | ICD-10-CM | POA: Diagnosis not present

## 2023-07-16 DIAGNOSIS — Z9181 History of falling: Secondary | ICD-10-CM | POA: Diagnosis not present

## 2023-07-16 DIAGNOSIS — M79605 Pain in left leg: Secondary | ICD-10-CM | POA: Diagnosis not present

## 2023-07-20 DIAGNOSIS — Z79899 Other long term (current) drug therapy: Secondary | ICD-10-CM | POA: Diagnosis not present

## 2023-07-21 ENCOUNTER — Other Ambulatory Visit: Payer: Self-pay

## 2023-07-21 MED ORDER — OMNIPOD 5 G7 PODS (GEN 5) MISC: 1.0000 | 3 refills | Status: DC

## 2023-07-21 MED ORDER — OMNIPOD 5 G7 INTRO (GEN 5) KIT: 1.0000 | PACK | 0 refills | Status: AC

## 2023-08-03 ENCOUNTER — Telehealth: Payer: Self-pay | Admitting: Nutrition

## 2023-08-03 ENCOUNTER — Encounter: Payer: Self-pay | Admitting: Family

## 2023-08-03 MED ORDER — INSULIN ASPART 100 UNIT/ML IJ SOLN: INTRAMUSCULAR | 3 refills | Status: DC

## 2023-08-03 NOTE — Telephone Encounter (Signed)
 Patient called requesting a pump training appointment.  Scheduled this for tomorrow. She will need her Novolog  in a vial.  Please order

## 2023-08-03 NOTE — Telephone Encounter (Signed)
 Recommendation for patient to consult with Health Department for advisement of vaccinations and screenings before international trip.

## 2023-08-04 ENCOUNTER — Ambulatory Visit: Admitting: Nutrition

## 2023-08-10 DIAGNOSIS — F4321 Adjustment disorder with depressed mood: Secondary | ICD-10-CM | POA: Diagnosis not present

## 2023-08-10 DIAGNOSIS — F330 Major depressive disorder, recurrent, mild: Secondary | ICD-10-CM | POA: Diagnosis not present

## 2023-08-10 DIAGNOSIS — F411 Generalized anxiety disorder: Secondary | ICD-10-CM | POA: Diagnosis not present

## 2023-08-11 ENCOUNTER — Encounter: Payer: Self-pay | Admitting: Internal Medicine

## 2023-08-11 ENCOUNTER — Telehealth: Payer: Self-pay | Admitting: Internal Medicine

## 2023-08-11 DIAGNOSIS — M3500 Sicca syndrome, unspecified: Secondary | ICD-10-CM | POA: Diagnosis not present

## 2023-08-11 DIAGNOSIS — M1990 Unspecified osteoarthritis, unspecified site: Secondary | ICD-10-CM | POA: Diagnosis not present

## 2023-08-11 DIAGNOSIS — M25569 Pain in unspecified knee: Secondary | ICD-10-CM | POA: Diagnosis not present

## 2023-08-11 DIAGNOSIS — M25559 Pain in unspecified hip: Secondary | ICD-10-CM | POA: Diagnosis not present

## 2023-08-11 DIAGNOSIS — E1169 Type 2 diabetes mellitus with other specified complication: Secondary | ICD-10-CM | POA: Diagnosis not present

## 2023-08-11 DIAGNOSIS — Z79899 Other long term (current) drug therapy: Secondary | ICD-10-CM | POA: Diagnosis not present

## 2023-08-11 DIAGNOSIS — R682 Dry mouth, unspecified: Secondary | ICD-10-CM | POA: Diagnosis not present

## 2023-08-11 DIAGNOSIS — M2550 Pain in unspecified joint: Secondary | ICD-10-CM | POA: Diagnosis not present

## 2023-08-11 DIAGNOSIS — M549 Dorsalgia, unspecified: Secondary | ICD-10-CM | POA: Diagnosis not present

## 2023-08-11 DIAGNOSIS — H04129 Dry eye syndrome of unspecified lacrimal gland: Secondary | ICD-10-CM | POA: Diagnosis not present

## 2023-08-11 DIAGNOSIS — M542 Cervicalgia: Secondary | ICD-10-CM | POA: Diagnosis not present

## 2023-08-11 DIAGNOSIS — M79643 Pain in unspecified hand: Secondary | ICD-10-CM | POA: Diagnosis not present

## 2023-08-11 NOTE — Telephone Encounter (Signed)
 Patient stopped by. She said she still can not get her medication ( she says its insulin , does not know the name ) - please call pt. [phone]

## 2023-08-12 ENCOUNTER — Telehealth: Payer: Self-pay

## 2023-08-12 ENCOUNTER — Other Ambulatory Visit: Payer: Self-pay

## 2023-08-12 ENCOUNTER — Ambulatory Visit: Admitting: Nutrition

## 2023-08-12 MED ORDER — INSULIN ASPART 100 UNIT/ML IJ SOLN: INTRAMUSCULAR | 3 refills | Status: DC

## 2023-08-12 MED ORDER — TIRZEPATIDE 2.5 MG/0.5ML ~~LOC~~ SOAJ: 2.5000 mg | SUBCUTANEOUS | 3 refills | Status: AC

## 2023-08-12 NOTE — Telephone Encounter (Signed)
 Patient states that she has been having some LBS. She states they have been as low as 40. She's currently taking Lantus  10 units in the morning and Novolog   8-6-8 with  meals. Jardiance   25mg . I have printed her Dexcom report for review. Please advise on changes as Dr. Rosalea Collin is out of office.

## 2023-08-12 NOTE — Telephone Encounter (Signed)
 Patient was advised and verbalized understanding. Patient will also reach out to express script and make sure medication will be sent out so she can do her training on pump.

## 2023-08-12 NOTE — Telephone Encounter (Signed)
 Courtney [name], I reviewed her Dexcom downloads.  I do not see consistent patterns.  Does have occasional low blood sugars at night despite the fact that she is taking Lantus  in the morning.  The incident with a blood sugar at 42 appears to have been on 08/08/2023 at 7:35 PM.  The pattern suggested a compression low, however(due to compressing the sensor site).  She also had a low blood sugar during the night on 08/06/2023, which may also have been a compression low since this was quite abrupt.  My suggestion for now would be the following: - Please verify all the low blood sugars with her glucometer - Sugars for the first 12 hours after changing the sensor may not be reliable and may show more lows - Please bolus NovoLog  15 minutes before each meal, not later.  If she has to bolus after the meal, she may need to take less NovoLog , maybe up to 4-5 units. - Change the dose of NovoLog  based on the size of the meal.  She may need to take 8 units for a larger meal, but for smaller meals, she may need to take only 5 to 6 units. - For now I would suggest to continue the same dose of Lantus .  Ty! C

## 2023-08-14 ENCOUNTER — Encounter: Payer: Self-pay | Admitting: Internal Medicine

## 2023-08-18 DIAGNOSIS — M7918 Myalgia, other site: Secondary | ICD-10-CM | POA: Insufficient documentation

## 2023-08-18 DIAGNOSIS — M542 Cervicalgia: Secondary | ICD-10-CM | POA: Diagnosis not present

## 2023-08-19 ENCOUNTER — Telehealth: Payer: Self-pay | Admitting: Nutrition

## 2023-08-19 DIAGNOSIS — G35 Multiple sclerosis: Secondary | ICD-10-CM | POA: Diagnosis not present

## 2023-08-19 DIAGNOSIS — E1140 Type 2 diabetes mellitus with diabetic neuropathy, unspecified: Secondary | ICD-10-CM | POA: Diagnosis not present

## 2023-08-19 DIAGNOSIS — F331 Major depressive disorder, recurrent, moderate: Secondary | ICD-10-CM | POA: Diagnosis not present

## 2023-08-19 DIAGNOSIS — M79605 Pain in left leg: Secondary | ICD-10-CM | POA: Diagnosis not present

## 2023-08-19 DIAGNOSIS — M79604 Pain in right leg: Secondary | ICD-10-CM | POA: Diagnosis not present

## 2023-08-19 DIAGNOSIS — Z794 Long term (current) use of insulin: Secondary | ICD-10-CM | POA: Diagnosis not present

## 2023-08-19 DIAGNOSIS — E669 Obesity, unspecified: Secondary | ICD-10-CM | POA: Diagnosis not present

## 2023-08-19 DIAGNOSIS — Z79899 Other long term (current) drug therapy: Secondary | ICD-10-CM | POA: Diagnosis not present

## 2023-08-19 DIAGNOSIS — N1831 Chronic kidney disease, stage 3a: Secondary | ICD-10-CM | POA: Diagnosis not present

## 2023-08-19 DIAGNOSIS — G8929 Other chronic pain: Secondary | ICD-10-CM | POA: Diagnosis not present

## 2023-08-19 DIAGNOSIS — M549 Dorsalgia, unspecified: Secondary | ICD-10-CM | POA: Diagnosis not present

## 2023-08-19 DIAGNOSIS — Z9181 History of falling: Secondary | ICD-10-CM | POA: Diagnosis not present

## 2023-08-19 NOTE — Telephone Encounter (Signed)
 LVM toi call me to schedule pump training

## 2023-08-21 DIAGNOSIS — G35 Multiple sclerosis: Secondary | ICD-10-CM | POA: Diagnosis not present

## 2023-08-21 DIAGNOSIS — Z79899 Other long term (current) drug therapy: Secondary | ICD-10-CM | POA: Diagnosis not present

## 2023-08-21 DIAGNOSIS — M542 Cervicalgia: Secondary | ICD-10-CM | POA: Diagnosis not present

## 2023-08-21 DIAGNOSIS — M25512 Pain in left shoulder: Secondary | ICD-10-CM | POA: Diagnosis not present

## 2023-08-21 DIAGNOSIS — M25511 Pain in right shoulder: Secondary | ICD-10-CM | POA: Diagnosis not present

## 2023-08-21 DIAGNOSIS — M5459 Other low back pain: Secondary | ICD-10-CM | POA: Diagnosis not present

## 2023-08-21 DIAGNOSIS — M6281 Muscle weakness (generalized): Secondary | ICD-10-CM | POA: Diagnosis not present

## 2023-08-25 ENCOUNTER — Encounter: Attending: Internal Medicine | Admitting: Nutrition

## 2023-08-25 DIAGNOSIS — E1142 Type 2 diabetes mellitus with diabetic polyneuropathy: Secondary | ICD-10-CM | POA: Insufficient documentation

## 2023-08-25 DIAGNOSIS — Z794 Long term (current) use of insulin: Secondary | ICD-10-CM | POA: Insufficient documentation

## 2023-08-26 ENCOUNTER — Telehealth: Payer: Self-pay | Admitting: Nutrition

## 2023-08-26 NOTE — Patient Instructions (Signed)
 Bolus for all meals and snacks, making sure to add a blood sugar reading to all bolus calculations. Do a correction dose any time blood sugar is over 250. Call the OmniPod help line if questions about your insulin  pump Call the Dexcom help line if questions/problems with your sensors.

## 2023-08-26 NOTE — Progress Notes (Unsigned)
 Patient was trained on how to use the OmniPod 5 insulin  pump.  Her dexcom was linked to her phone in stead of a reader and this was linked to [place] [facility].  The sensor was also linked to her PDMl.  Settings were put in per Dr. Kris orders: basal rate: 0.3 She filled a pod with Novolog  insulin  and was shown where to apply this with reference to her sensor.  She applied this to her lower left abdomen without difficulty and the pump was started at 3PM.   We reviewed how to bolus and the need to add a blood sugar reading to every meal and snack.  She re demonstrated how to do this X2 correctly.  She was also shown how to do a correction bolus and we discussed when do this this. We reviewed all steps on the pump training sheet and she signed this indicating good understanding of all topics. She had no final questions

## 2023-08-26 NOTE — Telephone Encounter (Signed)
 Patient reported no difficulty giving boluses last night or today.  Her FBS today was 100.  Blood sugar now 120 with no lunch today.  She had no questions for me at this time.

## 2023-08-27 DIAGNOSIS — M25511 Pain in right shoulder: Secondary | ICD-10-CM | POA: Diagnosis not present

## 2023-08-27 DIAGNOSIS — M5459 Other low back pain: Secondary | ICD-10-CM | POA: Diagnosis not present

## 2023-08-27 DIAGNOSIS — M542 Cervicalgia: Secondary | ICD-10-CM | POA: Diagnosis not present

## 2023-08-27 DIAGNOSIS — M6281 Muscle weakness (generalized): Secondary | ICD-10-CM | POA: Diagnosis not present

## 2023-08-27 DIAGNOSIS — G35 Multiple sclerosis: Secondary | ICD-10-CM | POA: Diagnosis not present

## 2023-08-27 DIAGNOSIS — M25512 Pain in left shoulder: Secondary | ICD-10-CM | POA: Diagnosis not present

## 2023-08-31 DIAGNOSIS — M542 Cervicalgia: Secondary | ICD-10-CM | POA: Diagnosis not present

## 2023-08-31 DIAGNOSIS — Z1330 Encounter for screening examination for mental health and behavioral disorders, unspecified: Secondary | ICD-10-CM | POA: Diagnosis not present

## 2023-08-31 DIAGNOSIS — M4712 Other spondylosis with myelopathy, cervical region: Secondary | ICD-10-CM | POA: Diagnosis not present

## 2023-08-31 DIAGNOSIS — M791 Myalgia, unspecified site: Secondary | ICD-10-CM | POA: Diagnosis not present

## 2023-08-31 DIAGNOSIS — M4722 Other spondylosis with radiculopathy, cervical region: Secondary | ICD-10-CM | POA: Diagnosis not present

## 2023-09-01 ENCOUNTER — Ambulatory Visit: Admitting: Nutrition

## 2023-09-01 ENCOUNTER — Telehealth: Payer: Self-pay | Admitting: Nutrition

## 2023-09-01 DIAGNOSIS — M25511 Pain in right shoulder: Secondary | ICD-10-CM | POA: Diagnosis not present

## 2023-09-01 DIAGNOSIS — M6281 Muscle weakness (generalized): Secondary | ICD-10-CM | POA: Diagnosis not present

## 2023-09-01 DIAGNOSIS — M25512 Pain in left shoulder: Secondary | ICD-10-CM | POA: Diagnosis not present

## 2023-09-01 DIAGNOSIS — G35 Multiple sclerosis: Secondary | ICD-10-CM | POA: Diagnosis not present

## 2023-09-01 DIAGNOSIS — M542 Cervicalgia: Secondary | ICD-10-CM | POA: Diagnosis not present

## 2023-09-01 DIAGNOSIS — M5459 Other low back pain: Secondary | ICD-10-CM | POA: Diagnosis not present

## 2023-09-01 NOTE — Telephone Encounter (Signed)
 Patient left message on my machine that she does not need to come in today for a pump review.  Says has no questions and is changing pod without difficulty, and bolusing for all meals and snacks.

## 2023-09-03 DIAGNOSIS — M5459 Other low back pain: Secondary | ICD-10-CM | POA: Diagnosis not present

## 2023-09-03 DIAGNOSIS — M25511 Pain in right shoulder: Secondary | ICD-10-CM | POA: Diagnosis not present

## 2023-09-03 DIAGNOSIS — M542 Cervicalgia: Secondary | ICD-10-CM | POA: Diagnosis not present

## 2023-09-03 DIAGNOSIS — G35 Multiple sclerosis: Secondary | ICD-10-CM | POA: Diagnosis not present

## 2023-09-03 DIAGNOSIS — M25512 Pain in left shoulder: Secondary | ICD-10-CM | POA: Diagnosis not present

## 2023-09-03 DIAGNOSIS — M6281 Muscle weakness (generalized): Secondary | ICD-10-CM | POA: Diagnosis not present

## 2023-09-07 ENCOUNTER — Ambulatory Visit: Payer: Self-pay | Admitting: Internal Medicine

## 2023-09-07 ENCOUNTER — Ambulatory Visit: Attending: Genetic Counselor | Admitting: Genetic Counselor

## 2023-09-07 ENCOUNTER — Encounter: Payer: Self-pay | Admitting: Internal Medicine

## 2023-09-07 ENCOUNTER — Ambulatory Visit: Attending: Internal Medicine | Admitting: Internal Medicine

## 2023-09-07 VITALS — BP 149/90 | HR 71 | Ht 62.5 in | Wt 161.0 lb

## 2023-09-07 DIAGNOSIS — I2510 Atherosclerotic heart disease of native coronary artery without angina pectoris: Secondary | ICD-10-CM

## 2023-09-07 DIAGNOSIS — I422 Other hypertrophic cardiomyopathy: Secondary | ICD-10-CM

## 2023-09-07 DIAGNOSIS — I471 Supraventricular tachycardia, unspecified: Secondary | ICD-10-CM | POA: Diagnosis not present

## 2023-09-07 DIAGNOSIS — E785 Hyperlipidemia, unspecified: Secondary | ICD-10-CM | POA: Diagnosis not present

## 2023-09-07 NOTE — Progress Notes (Signed)
 Cardiology Office Note:  .    Date:  09/07/2023  ID:  Courtney [name], DOB 06-24-1955, MRN [id number] PCP: Courtney Greig PARAS, NP  [facility] Providers Cardiologist:  Candyce Reek, MD     CC: New diagnosis of aHCM  History of Present Illness: .    Courtney [name] is a 68 y.o. female with apical hypertrophic cardiomyopathy who presents for evaluation of wide complex tachycardia and hypertension. She is accompanied by Courtney [name], Courtney [name]. She was referred by Dr. Sonny for evaluation of apical hypertrophic cardiomyopathy.  She is experiencing wide complex tachycardia and hypertension, accompanied by severe neck and shoulder pain, difficulty turning Courtney head, and occasional heart fluttering. She is particularly concerned about Courtney heart health due to an upcoming trip to [place].  Courtney medical history includes hypertension, diabetes, multiple sclerosis, and chronic kidney disease stage 3A. She has not been on blood pressure medication for about five years, with home readings typically well-controlled between 90/70 to 120/82 mmHg.  She has experienced significant family losses, including the death of Courtney brother, daughter, and niece. Courtney brother possibly died from heart issues or diabetes, Courtney daughter from cardiac arrest with cigarette smoke and lupus noted, and another brother from kidney failure and heart problems. A sister is currently dealing with heart issues, and Courtney mother died from heart-related issues. There is a family history of heart disease, including a maternal aunt and cousins.  She has a history of small bowel obstruction and a broken hand, and she is under regular pain management care. She recalls past heart palpitations that subsided with treadmill exercises. She has one surviving daughter and four sisters.  Discussed the use of AI scribe software for clinical note transcription with the patient, who gave verbal consent to proceed.   Relevant histories: .   Social  - former [facility] patient - Partner Status: Married - Living Situation: Lives with [name] - Brother: Heart issues, Diabetes, deceased - Daughter: Lupus, Diabetes, deceased due to Cardiac arrest - Sister: Heart issues - Mother: High blood pressure, deceased due to Heart issues - Aunt: deceased due to Heart related illnesses - Cousin: deceased due to Aneurysm - Cousin: deceased at age 22 due to Heart attack - 2025: The patient has experienced significant recent losses, including Courtney brother, daughter, and niece. She is planning a trip to [place] and is concerned about Courtney heart health for the trip. Courtney [name] is a key support figure for Courtney.  ROS: As per HPI.   Studies Reviewed: .     Cardiac Studies & Procedures   ______________________________________________________________________________________________     ECHOCARDIOGRAM  ECHOCARDIOGRAM COMPLETE 06/03/2023  Narrative ECHOCARDIOGRAM REPORT    Patient Name:   Courtney [name] Date of Exam: 06/03/2023 Medical Rec #:  [id number]            Height:       62.5 in Accession #:    [id number]           Weight:       163.8 lb Date of Birth:  1955-03-08            BSA:          1.767 m Patient Age:    67 years             BP:           128/68 mmHg Patient Gender: F  HR:           82 bpm. Exam Location:  [address]  Procedure: 2D Echo, Cardiac Doppler, Color Doppler and Intracardiac Opacification Agent (Both Spectral and Color Flow Doppler were utilized during procedure).  Indications:    I10 Hypertension  History:        Patient has prior history of Echocardiogram examinations, most recent 08/24/2012. Abnormal ECG; Risk Factors:Hypertension and Diabetes.  Sonographer:    Carl Coma RDCS Referring Phys: [id number] JACKEE VEAR RADDLE DICK  IMPRESSIONS   1. There is severe LV apical hypertrophy with evidence of a spade ventricle consistent apical hypertrophic cardiomyopathy. Left ventricular  ejection fraction, by estimation, is 70 to 75%. The left ventricle has hyperdynamic function. The left ventricle has no regional wall motion abnormalities. There is severe left ventricular hypertrophy of the apical segment. Left ventricular diastolic parameters are consistent with Grade I diastolic dysfunction (impaired relaxation). 2. Right ventricular systolic function is normal. The right ventricular size is normal. 3. Left atrial size was mild to moderately dilated. 4. The mitral valve is normal in structure. Mild mitral valve regurgitation. No evidence of mitral stenosis. 5. The aortic valve is normal in structure. Aortic valve regurgitation is not visualized. No aortic stenosis is present. 6. The inferior vena cava is normal in size with greater than 50% respiratory variability, suggesting right atrial pressure of 3 mmHg.  Conclusion(s)/Recommendation(s): There is severe LV apical hypertrophy with evidence of a spade ventricle consistent apical hypertrophic cardiomyopathy.  FINDINGS Left Ventricle: There is severe LV apical hypertrophy with evidence of a spade ventricle consistent apical hypertrophic cardiomyopathy. Left ventricular ejection fraction, by estimation, is 70 to 75%. The left ventricle has hyperdynamic function. The left ventricle has no regional wall motion abnormalities. Definity  contrast agent was given IV to delineate the left ventricular endocardial borders. The left ventricular internal cavity size was normal in size. There is severe left ventricular hypertrophy of the apical segment. Left ventricular diastolic parameters are consistent with Grade I diastolic dysfunction (impaired relaxation).  Right Ventricle: The right ventricular size is normal. No increase in right ventricular wall thickness. Right ventricular systolic function is normal.  Left Atrium: Left atrial size was mild to moderately dilated.  Right Atrium: Right atrial size was normal in  size.  Pericardium: There is no evidence of pericardial effusion.  Mitral Valve: The mitral valve is normal in structure. Mild mitral valve regurgitation. No evidence of mitral valve stenosis.  Tricuspid Valve: The tricuspid valve is normal in structure. Tricuspid valve regurgitation is not demonstrated. No evidence of tricuspid stenosis.  Aortic Valve: The aortic valve is normal in structure. Aortic valve regurgitation is not visualized. No aortic stenosis is present.  Pulmonic Valve: The pulmonic valve was normal in structure. Pulmonic valve regurgitation is mild. No evidence of pulmonic stenosis.  Aorta: The aortic root is normal in size and structure.  Venous: The inferior vena cava is normal in size with greater than 50% respiratory variability, suggesting right atrial pressure of 3 mmHg.  IAS/Shunts: There is right bowing of the interatrial septum, suggestive of elevated left atrial pressure. No atrial level shunt detected by color flow Doppler.   LEFT VENTRICLE PLAX 2D LVIDd:         4.90 cm   Diastology LVIDs:         2.30 cm   LV e' medial:    6.20 cm/s LV PW:         0.60 cm   LV E/e' medial:  15.5  LV IVS:        0.60 cm   LV e' lateral:   10.20 cm/s LVOT diam:     1.50 cm   LV E/e' lateral: 9.4 LV SV:         50 LV SV Index:   28 LVOT Area:     1.77 cm   RIGHT VENTRICLE             IVC RV Basal diam:  3.30 cm     IVC diam: 1.50 cm RV S prime:     13.77 cm/s TAPSE (M-mode): 2.0 cm  LEFT ATRIUM             Index        RIGHT ATRIUM           Index LA diam:        4.50 cm 2.55 cm/m   RA Area:     10.50 cm LA Vol (A2C):   36.0 ml 20.38 ml/m  RA Volume:   23.80 ml  13.47 ml/m LA Vol (A4C):   39.1 ml 22.13 ml/m LA Biplane Vol: 37.7 ml 21.34 ml/m AORTIC VALVE LVOT Vmax:   170.67 cm/s LVOT Vmean:  103.333 cm/s LVOT VTI:    0.284 m  AORTA Ao Root diam: 2.90 cm Ao Asc diam:  2.60 cm  MITRAL VALVE MV Area (PHT): 4.49 cm    SHUNTS MV Decel Time: 169 msec     Systemic VTI:  0.28 m MV E velocity: 96.35 cm/s  Systemic Diam: 1.50 cm MV A velocity: 62.30 cm/s MV E/A ratio:  1.55  Toribio Fuel MD Electronically signed by Toribio Fuel MD Signature Date/Time: 06/03/2023/12:27:21 PM    Final    MONITORS  LONG TERM MONITOR (3-14 DAYS) 06/29/2023  Narrative   Patient had a minimum heart rate of 44 bpm, maximum heart rate of 185 bpm, and average heart rate of 68 bpm. Predominant underlying rhythm was sinus rhythm. 6 beats of relatively WCT rate 128 with abnormal axis.  Potential slow NSVT. Two short runs of SVT. Isolated PACs were rare (<1.0%). Isolated PVCs were rare (<1.0%). Triggered and diary events associated with sinus rhythm.  No malignant arrhythmias.       ______________________________________________________________________________________________      Physical Exam:    VS:  BP (!) 149/90 (BP Location: Right Arm)   Pulse 71   Ht 5' 2.5 (1.588 m)   Wt 161 lb (73 kg)   SpO2 93%   BMI 28.98 kg/m    Wt Readings from Last 3 Encounters:  09/07/23 161 lb (73 kg)  06/15/23 161 lb (73 kg)  05/11/23 163 lb 12.8 oz (74.3 kg)    Gen: no distress Neck: No JVD Cardiac: No Rubs or Gallops, holosystolic murmur, RRR +2 radial pulses Respiratory: Clear to auscultation bilaterally, normal effort, normal  respiratory rate GI: Soft, nontender, non-distended  MS: No edema;  moves all extremities Integument: Skin feels warm Neuro:  At time of evaluation, alert and oriented to person/place/time/situation  Psych: Normal affect, patient feels ok   ASSESSMENT AND PLAN: .    Hypertrophic Cardiomyopathy  - Apical Variant - apical thickness 14 with indexed measurement of 8 - without Apical Aneurysm - suspicion of Fabry's/Danon/Noonan's or other mimics of HCM: Low - Gene variant: Pending - NYHA II - Biomarkers - pVO2: NA  - Non HCM Contributors to disease/status  Premature Ventricular Contractions Intermittent premature  ventricular contractions with occasional fluttering sensations. No high-risk ventricular tachycardia. -  Monitor symptoms and consider starting metoprolol  after return from [place] if symptoms persist. - Repeat heart monitor if necessary to assess for atrial fibrillation.  Hypertension Blood pressure generally well-controlled at home, with occasional clinic elevations likely due to stress and recent bereavement. No current need for antihypertensive medication. - Monitor blood pressure at home. - Reassess blood pressure management after return from [place].  Diabetes Mellitus Blood sugar levels monitored; recent reading 172 mg/dL. Emphasized importance of maintaining blood sugar control. - Continue monitoring blood sugar levels. - Reassess diabetes management after return from [place].  Hyperlipidemia Hyperlipidemia management pending further evaluation. Previous heart catheterizations showed no coronary artery disease. Discussed importance of aggressive heart attack prevention based on lipid panel results. - Order fasting lipid panel and lipoprotein(a) in late August after return from [place]. - Consider more aggressive heart attack prevention based on lipid panel results.  Family history reviewed, Discussed family screening  FHX of SCD (daughter) - genetic testing pending   - Apical hypertrophic cardiomyopathy with maximal apical thickness of 14 mm. No coronary artery disease. Echocardiogram showed no aneurysm; MRI pending for confirmation. Family history indicates potential risk of sudden cardiac death, especially in Courtney daughter with lupus and diabetes. Current risk of sudden cardiac death is low at 1.62% over five years. No atrial fibrillation or high-risk ventricular tachycardia. Premature ventricular contractions noted. Discussed potential defibrillator need if MRI shows significant findings like aneurysm or scar tissue. - Order cardiac MRI in September to assess for aneurysm and scar tissue. -  Consider referral to a heart rhythm specialist if MRI shows significant findings. - Discuss genetic testing with Dr. Fairy, a clinical geneticist, to evaluate family risk. - Provide educational resources from the American [employer] Association about hypertrophic cardiomyopathy. - HCM- AF 19 - Full SCD eval pending  Time Spent Directly with Patient:   I have spent a total of 58 minutes with the patient reviewing notes, imaging, EKGs, labs and examining the patient as well as establishing an assessment and plan that was discussed personally with the patient. Discussed disease state education , using shared decision making tools and cardiac modeling , and SCD risk. Reviewed care and plan in collaboration with Courtney [name] and clinical genetics  Stanly Leavens, MD FASE [facility] Cardiologist [facility]  [address] [place], [address] [place], [place] [address] [phone]  4:51 PM

## 2023-09-07 NOTE — Patient Instructions (Addendum)
 Medication Instructions:  Your physician recommends that you continue on your current medications as directed. Please refer to the Current Medication list given to you today.  *If you need a refill on your cardiac medications before your next appointment, please call your pharmacy*  Lab Work: IN LATE Aug at any [facility]: Fasting lipid panel and Lipoprotein A (nothing to eat or drink 12 hours prior except water )  If you have labs (blood work) drawn today and your tests are completely normal, you will receive your results only by: MyChart Message (if you have MyChart) OR A paper copy in the mail If you have any lab test that is abnormal or we need to change your treatment, we will call you to review the results.  Testing/Procedures: Your physician has requested that you have a cardiac MRI. Cardiac MRI uses a computer to create images of your heart as its beating, producing both still and moving pictures of your heart and major blood vessels. For further information please visit [email or web address]. Please follow the instruction sheet given to you today for more information.   Your physician has requested that you have an exercise tolerance test. For further information please visit [email or web address] Please also follow instruction sheet, as given.   DO NOT TAKE YOUR _____N/A_______________. Do not eat, drink or use tobacco products 4 hour prior to the test. Dress prepared to exercise in a comfortable, two piece clothing outfit and walking shoes. Do not wear any perfume, cologne, aftershave or lotion. Bring any current prescription medications with you the day of the test. Notify the office 24 hours in advance if you cannot keep this appointment. If you have any questions please call [phone].     Follow-Up: At [facility], you and your health needs are our priority.  As part of our continuing mission to provide you with exceptional heart care, our providers are all part of  one team.  This team includes your primary Cardiologist (physician) and Advanced Practice Providers or APPs (Physician Assistants and Nurse Practitioners) who all work together to provide you with the care you need, when you need it.  Your next appointment:   5 month(s)  Provider:   Stanly Leavens, MD    Other Instructions  You are scheduled for Cardiac MRI at the location below.  Please arrive for your appointment at ______________ . ?  [facility] [address] [place], [place] [address] Please take advantage of the free valet parking available at the [facility] and [employer] (Entrance C).  Proceed to the [facility] Radiology Department (First Floor) for check-in.   OR   [facility] [address] [place], [place] [address] Please go to the [facility] and check-in with the desk attendant.   Magnetic resonance imaging (MRI) is a painless test that produces images of the inside of the body without using Xrays.  During an MRI, strong magnets and radio waves work together in a Data processing manager to form detailed images.   MRI images may provide more details about a medical condition than X-rays, CT scans, and ultrasounds can provide.  You may be given earphones to listen for instructions.  You may eat a light breakfast and take medications as ordered with the exception of furosemide, hydrochlorothiazide, chlorthalidone or spironolactone (or any other fluid pill). If you are undergoing a stress MRI, please avoid stimulants for 12 hr prior to test. (I.e. Caffeine, nicotine, chocolate, or antihistamine medications)  If your provider has ordered  anti-anxiety medications for this test, then you will need a driver.  An IV will be inserted into one of your veins. Contrast material will be injected into your IV. It will leave your body through your urine within a day. You may be told to drink plenty of fluids to help flush the contrast  material out of your system.  You will be asked to remove all metal, including: Watch, jewelry, and other metal objects including hearing aids, hair pieces and dentures. Also wearable glucose monitoring systems (ie. Freestyle Libre and Omnipods) (Braces and fillings normally are not a problem.)   TEST WILL TAKE APPROXIMATELY 1 HOUR  PLEASE NOTIFY SCHEDULING AT LEAST 24 HOURS IN ADVANCE IF YOU ARE UNABLE TO KEEP YOUR APPOINTMENT. [phone]  For more information and frequently asked questions, please visit our website : [email or web address]  Please call the Cardiac Imaging Nurse Navigators with any questions/concerns. [phone] Office

## 2023-09-08 ENCOUNTER — Ambulatory Visit: Admitting: Physical Medicine and Rehabilitation

## 2023-09-09 DIAGNOSIS — Z79899 Other long term (current) drug therapy: Secondary | ICD-10-CM | POA: Diagnosis not present

## 2023-09-09 DIAGNOSIS — M5013 Cervical disc disorder with radiculopathy, cervicothoracic region: Secondary | ICD-10-CM | POA: Diagnosis not present

## 2023-09-09 DIAGNOSIS — M4712 Other spondylosis with myelopathy, cervical region: Secondary | ICD-10-CM | POA: Diagnosis not present

## 2023-09-09 DIAGNOSIS — M3500 Sicca syndrome, unspecified: Secondary | ICD-10-CM | POA: Diagnosis not present

## 2023-09-09 DIAGNOSIS — M50121 Cervical disc disorder at C4-C5 level with radiculopathy: Secondary | ICD-10-CM | POA: Diagnosis not present

## 2023-09-09 DIAGNOSIS — M4722 Other spondylosis with radiculopathy, cervical region: Secondary | ICD-10-CM | POA: Diagnosis not present

## 2023-09-09 DIAGNOSIS — M50123 Cervical disc disorder at C6-C7 level with radiculopathy: Secondary | ICD-10-CM | POA: Diagnosis not present

## 2023-09-09 NOTE — Telephone Encounter (Signed)
 Patient wants a call back from RN Shamea regarding 7/31.  Patient stated she has call blocker but can leave message and she will call back.

## 2023-09-10 ENCOUNTER — Telehealth: Payer: Self-pay | Admitting: Dietician

## 2023-09-10 MED ORDER — ONETOUCH VERIO VI STRP: ORAL_STRIP | 12 refills | Status: DC

## 2023-09-10 MED ORDER — ONETOUCH ULTRASOFT LANCETS MISC: 12 refills | Status: DC

## 2023-09-10 NOTE — Telephone Encounter (Signed)
-----   Message from [place] Leita Ruth sent at 09/10/2023 10:35 AM EDT ----- Regarding: prescription request Patient needs strips and lancets for the One Touch Verio Flex meter sent to her pharmacy.  (This is not the meter in the system but states that linda gave her the One Touch).   Thanks

## 2023-09-10 NOTE — Telephone Encounter (Signed)
 Patient called and states that she is having increased neck pain resulting in increased sensor readings to 300 per patient but Dexcom Clarity reviewed and fasting readings have been 75-150 and post meal to a high of 120. She state she will have a steroid injection for her neck 09/15/2023.  She states that the last time she had steroids her blood glucose increased to 800.  She states that she almost uses all of the insulin  in the POD due to increased insulin  needs.  Instructed her to fill the POD with a little more insulin  on Tuesday as she will require more insulin .   Instructed her to run her pump in Manual mode when her blood glucose increases after the steroid injection.  Will forward to [place] to also have her give advice.  Leita Constable, RD, LDN, CDCES, DipACLM

## 2023-09-10 NOTE — Progress Notes (Signed)
 Pre Test Genetic Consult  Referral Reason  Courtney [name] is referred for genetic consult and testing of hypertrophic cardiomyopathy.   Personal Medical Information Courtney [name] (III.6 on pedigree) is a 68 y.o. African American lady who is here today with her husband, Courtney [name]. She reports having an abnormal EKG IN 2007 when she was in [place]  and was evaluated at [facility] where her cardiac imaging studies did not detect any abnormalities.    She reports having heart palpitations about 204 times a month and experiences dyspnea when walking up a flight of steps. Otherwise, reports no functional limitations and denies chest discomfort, dizzyness or syncope.  A recent Echocardiogram (06/03/23) detected severe LV apical hypertrophy with "spade" ventricle morphology and LVEF of 70 to 75%. Cardiac MRI is scheduled.   Traditional Risk Factors Courtney [name] reports having HTN but has not been on medication as she states that she her blood pressure has been within normal ranges during this time.  Family history She has experienced significant family losses, including the death of her brother, daughter, and niece. Her brother possibly died from heart issues or diabetes, her daughter from cardiac arrest with cigarette smoke and lupus noted, and another brother from kidney failure and heart problems. A sister is currently dealing with heart issues, and her mother died from heart-related issues. There is a family history of heart disease, including a maternal aunt and cousins.  Relation to Proband Pedigree # Current age Heart condition/age of onset Notes  Daughters, 2 IV.14, IV.15 Deceased 50 IV.14- heart issues IV.14- died @ 69- sudden cardiac arrest IV.15- estranged  Grandchildren, 4 V.2-V.5 35-21 None         Brothers, 4 III.3-III.5, III.7 Deceased None III.3- Sudden death- found dead at home by the fire department when the sisters could not enter his home III.4- died @ 47s- cardiac and renal failure III.5-  ALS III.7- Died @ 35-of  ?  Sisters, 2 III.1, III.2 76, 75 Heart issues III.1- very private and will not discuss health issues Estranged from III.2  Nephews, nieces IV.1- IV.13 44s- 20s Deceased None         Father II.1 Deceased None Murdered by friend and left on train tracks. Patient was 1 at time of death  Paternal grandfather 1.1 Deceased None Died @ ? of ?  Paternal grandmother I.2 Deceased None Died @ ? of ?        Mother II.2 Deceased None Died suddenly @ 8- found dead by son. Recommended kidney dialysis but patient unwilling. Autopsy not done but death certificate states death was cardiac-related  Maternal aunts, 8 II.3-II.10 76, 75 Deceased None 2 died @ 58s OF  ? 1 died of M.I.? Others?  Maternal uncles, 6 II.11-II.16 11 Deceased None 2 died of aortic aneurysm  Maternal grandfather I.3 Deceased None Died @ ? of ?  Maternal grandmother I.4 Deceased None Died @ ? of ?   Genetics Courtney [name] was counseled on the genetics of hypertrophic cardiomyopathy (HCM). I explained to the patient that this is an autosomal dominant condition with incomplete penetrance i.e. not all individuals harboring the HCM mutation will present clinically with HCM, and age-related penetrance where clinical presentation of HCM increases with advanced age. Variability in clinical expression is also seen in families with HCM with affected family members presenting clinically at different ages and with symptoms ranging from mild to severe.  Since HCM is an autosomal dominant condition, first degree-relatives are at a 50% risk of inheriting this condition. They should  seek regular surveillance for HCM.  First-degree relatives include her daughter and sisters with whom she is estranged. At this time her grandchildren, nephews and nieces do not need to undergo screening- they can be screened if they are symptomatic or if their parent is found to have HCM.    Clinical screening of first-degree relatives involves  echocardiogram and EKG at regular intervals, frequency is typically determined by age, with children undergoing screening every year until the age of 75 and those over the age of 68 getting screened every 3-5 years until the age of 7. Patient verbalized understanding of this.  Also briefly discussed the inheritance pattern and treatment /management plans for the infiltrative cardiomyopathies that present as HCM phenocopies. About 8-10% of HCM patients can have compound and digenic sarcomeric mutations for HCM.  Patient should be aware that genetic testing is a probabilistic test dependent upon age and severity of presentation, presence of risk factors for HCM and importantly family history of HCM or sudden death in first-degree relatives. The potential outcomes of genetic testing and subsequent management of at-risk family members is listed below-  If a mutation is not identified then it is important that he understands that HCM is a genetic condition and can be passed down to his children. All first-degree relatives should undergo regular screening for HCM.  A negative test result can be due to limitations of the genetic test.   There is also the likelihood of identifying a "Variant of unknown significance." This result means that the variant has not been detected in a statistically significant number of HCM patients and/or functional studies have not been performed to verify its pathogenicity. This VUS can be tested in the family to see if it segregates with disease. If a VUS is found, first-degree relatives should undergo regular clinical screening for HCM, but genetic testing for the VUS is otherwise not warranted.  If a pathogenic variant is reported, then first-degree family members can get tested for this variant. If they test positive, it is likely they will develop HCM. In light of variable expression and incomplete penetrance associated with HCM, it is not possible to predict when they will  manifest clinically with HCM. It is recommended that family members that test positive for the familial pathogenic variant pursue clinical screening for HCM. Family members that test negative for the familial mutation need not pursue periodic screening for HCM, but seek care if symptoms develop.   Impression  Courtney [name] was found to have cardiac wall thickness suggestive of HCM at age 17 in the absence of other cardiac loading conditions that can lead to cardiac hypertrophy. There is no family history of HCM but reports of three first-degree relatives with sudden death, including her mother.  Genetic testing is recommended to confirm her diagnosis. This test should include the major sarcomeric genes involved in HCM, namely MYBPPC3, MYH7, TNNI3, TNNT2, TPM1, ACTC1, MYL2 and MYL3. It should also include the genes involved in HCM phenocopies as cardiac-predominant forms of these conditions present clinically as HCM. These include genes for Fabry disease (GLA), Danon disease (LAMP2), WPW syndrome (PRKAG2), Familial transthyretin amyloidosis (TTR) and phospholamban (PLN).  In addition, patient should be aware of protections afforded by the Genetic Information Non-Discrimination Act (GINA). GINA protects a patient from losing their employment or health insurance based on their genotype. However, these protections do not cover life insurance and disability. Explained to the patient that family members that are found to have the familial genetic mutation will be denied  life insurance even if they are asymptomatic and do not exhibit clinical signs of HCM. She verbalized understanding.   Please note that the patient has not been counseled in this visit on other personal, cultural, or ethical issues that she may face due to her heart condition.   Plan After a thorough discussion of the risk and benefits of genetic testing for HCM, Courtney [name] declines genetic testing as she is estranged from her only living child as well as  her siblings. She intends to discuss HCM screening guidelines and procuring life insurance with her family.    Danford Pac, Ph.D, [facility] Clinical Molecular Geneticist

## 2023-09-10 NOTE — Telephone Encounter (Signed)
 Called pt back had no cardiac needs at this time.

## 2023-09-15 ENCOUNTER — Telehealth: Payer: Self-pay | Admitting: Nutrition

## 2023-09-15 DIAGNOSIS — M5412 Radiculopathy, cervical region: Secondary | ICD-10-CM | POA: Diagnosis not present

## 2023-09-15 DIAGNOSIS — M5013 Cervical disc disorder with radiculopathy, cervicothoracic region: Secondary | ICD-10-CM | POA: Diagnosis not present

## 2023-09-15 MED ORDER — ONETOUCH VERIO VI STRP: ORAL_STRIP | 12 refills | Status: AC

## 2023-09-15 NOTE — Telephone Encounter (Signed)
 Done

## 2023-09-15 NOTE — Telephone Encounter (Signed)
 She reports that FBS today was 160, the first time is is below 200 and she was able to get it down yesterday doing correction doses and with the pump in the automated mode.   Patient reports that she will be starting prednisone  20mg  for 14 days.  She was told that this will again make her blood sugar go high, and to let us  know if they go over 250.  She agreed to do this and had no final questions.

## 2023-09-15 NOTE — Telephone Encounter (Signed)
 Patient was called and reports she is starting  20mg  of prednisone  tomorrow for 14 days.  She was told to call us  if blood sugars go over 250.  She agreed to do this.  She was also told to fill pod with 175 units of insulin ,and continue to take 8u ac meals-remembering to add a blood sugar to every bolus.

## 2023-09-15 NOTE — Telephone Encounter (Signed)
 Patient called back asking that we call in the 3 month supply of test strips to [facility].  They will cost her less than 1/2 the cost.

## 2023-09-16 ENCOUNTER — Telehealth: Payer: Self-pay | Admitting: Nutrition

## 2023-09-16 NOTE — Telephone Encounter (Signed)
 Patient reported that she started on her 20mg . Of prednisone  this morning.  She took 8u with lunch and blood sugar is now 151.  She was please.  No advice given

## 2023-09-17 DIAGNOSIS — N1831 Chronic kidney disease, stage 3a: Secondary | ICD-10-CM | POA: Diagnosis not present

## 2023-09-17 DIAGNOSIS — E1140 Type 2 diabetes mellitus with diabetic neuropathy, unspecified: Secondary | ICD-10-CM | POA: Diagnosis not present

## 2023-09-17 DIAGNOSIS — G35 Multiple sclerosis: Secondary | ICD-10-CM | POA: Diagnosis not present

## 2023-09-17 DIAGNOSIS — G8929 Other chronic pain: Secondary | ICD-10-CM | POA: Diagnosis not present

## 2023-09-17 DIAGNOSIS — M79604 Pain in right leg: Secondary | ICD-10-CM | POA: Diagnosis not present

## 2023-09-17 DIAGNOSIS — M549 Dorsalgia, unspecified: Secondary | ICD-10-CM | POA: Diagnosis not present

## 2023-09-17 DIAGNOSIS — F331 Major depressive disorder, recurrent, moderate: Secondary | ICD-10-CM | POA: Diagnosis not present

## 2023-09-17 DIAGNOSIS — Z9181 History of falling: Secondary | ICD-10-CM | POA: Diagnosis not present

## 2023-09-17 DIAGNOSIS — Z79899 Other long term (current) drug therapy: Secondary | ICD-10-CM | POA: Diagnosis not present

## 2023-09-17 DIAGNOSIS — Z794 Long term (current) use of insulin: Secondary | ICD-10-CM | POA: Diagnosis not present

## 2023-09-17 DIAGNOSIS — M79605 Pain in left leg: Secondary | ICD-10-CM | POA: Diagnosis not present

## 2023-09-17 DIAGNOSIS — E663 Overweight: Secondary | ICD-10-CM | POA: Diagnosis not present

## 2023-10-01 DIAGNOSIS — N189 Chronic kidney disease, unspecified: Secondary | ICD-10-CM | POA: Diagnosis not present

## 2023-10-01 DIAGNOSIS — E1122 Type 2 diabetes mellitus with diabetic chronic kidney disease: Secondary | ICD-10-CM | POA: Diagnosis not present

## 2023-10-01 DIAGNOSIS — N2581 Secondary hyperparathyroidism of renal origin: Secondary | ICD-10-CM | POA: Diagnosis not present

## 2023-10-01 DIAGNOSIS — E1129 Type 2 diabetes mellitus with other diabetic kidney complication: Secondary | ICD-10-CM | POA: Diagnosis not present

## 2023-10-01 DIAGNOSIS — R3915 Urgency of urination: Secondary | ICD-10-CM | POA: Diagnosis not present

## 2023-10-01 DIAGNOSIS — D631 Anemia in chronic kidney disease: Secondary | ICD-10-CM | POA: Diagnosis not present

## 2023-10-01 DIAGNOSIS — N1831 Chronic kidney disease, stage 3a: Secondary | ICD-10-CM | POA: Diagnosis not present

## 2023-10-01 DIAGNOSIS — I129 Hypertensive chronic kidney disease with stage 1 through stage 4 chronic kidney disease, or unspecified chronic kidney disease: Secondary | ICD-10-CM | POA: Diagnosis not present

## 2023-10-05 DIAGNOSIS — M4802 Spinal stenosis, cervical region: Secondary | ICD-10-CM | POA: Diagnosis not present

## 2023-10-05 DIAGNOSIS — M5412 Radiculopathy, cervical region: Secondary | ICD-10-CM | POA: Diagnosis not present

## 2023-10-05 DIAGNOSIS — F411 Generalized anxiety disorder: Secondary | ICD-10-CM | POA: Diagnosis not present

## 2023-10-05 DIAGNOSIS — F4321 Adjustment disorder with depressed mood: Secondary | ICD-10-CM | POA: Diagnosis not present

## 2023-10-05 DIAGNOSIS — F330 Major depressive disorder, recurrent, mild: Secondary | ICD-10-CM | POA: Diagnosis not present

## 2023-10-07 ENCOUNTER — Encounter: Payer: Self-pay | Admitting: Internal Medicine

## 2023-10-08 ENCOUNTER — Encounter: Payer: Self-pay | Admitting: Internal Medicine

## 2023-10-09 ENCOUNTER — Other Ambulatory Visit: Payer: Self-pay | Admitting: Family

## 2023-10-09 MED ORDER — NOVOLOG FLEXPEN 100 UNIT/ML ~~LOC~~ SOPN: 6.0000 [IU] | PEN_INJECTOR | Freq: Three times a day (TID) | SUBCUTANEOUS | 0 refills | Status: DC

## 2023-10-09 MED ORDER — INSULIN PEN NEEDLE 32G X 4 MM MISC: 1.0000 | Freq: Four times a day (QID) | 0 refills | Status: AC

## 2023-10-09 MED ORDER — LANTUS SOLOSTAR 100 UNIT/ML ~~LOC~~ SOPN: 14.0000 [IU] | PEN_INJECTOR | Freq: Every day | SUBCUTANEOUS | 0 refills | Status: DC

## 2023-10-12 ENCOUNTER — Telehealth: Payer: Self-pay | Admitting: Nutrition

## 2023-10-12 NOTE — Telephone Encounter (Signed)
 WE discussed how to carry her insulin , and sensors, how much extra insulin  to carry with her, and need for 2-3 more pods to carry as well.  Also discussed  need to carry a meter with test strips as well as good walking shoes and remember that if she is doing more walking than usual, she made need a few snacks, or reduce her meal time bolus by 1-2u.  She reported good understanding of this and had no final questions.

## 2023-10-13 DIAGNOSIS — Z6830 Body mass index (BMI) 30.0-30.9, adult: Secondary | ICD-10-CM | POA: Diagnosis not present

## 2023-10-13 DIAGNOSIS — M549 Dorsalgia, unspecified: Secondary | ICD-10-CM | POA: Diagnosis not present

## 2023-10-13 DIAGNOSIS — M79604 Pain in right leg: Secondary | ICD-10-CM | POA: Diagnosis not present

## 2023-10-13 DIAGNOSIS — G35 Multiple sclerosis: Secondary | ICD-10-CM | POA: Diagnosis not present

## 2023-10-13 DIAGNOSIS — E6609 Other obesity due to excess calories: Secondary | ICD-10-CM | POA: Diagnosis not present

## 2023-10-13 DIAGNOSIS — Z9181 History of falling: Secondary | ICD-10-CM | POA: Diagnosis not present

## 2023-10-13 DIAGNOSIS — G8929 Other chronic pain: Secondary | ICD-10-CM | POA: Diagnosis not present

## 2023-10-13 DIAGNOSIS — M069 Rheumatoid arthritis, unspecified: Secondary | ICD-10-CM | POA: Diagnosis not present

## 2023-10-13 DIAGNOSIS — Z79899 Other long term (current) drug therapy: Secondary | ICD-10-CM | POA: Diagnosis not present

## 2023-10-13 DIAGNOSIS — M79605 Pain in left leg: Secondary | ICD-10-CM | POA: Diagnosis not present

## 2023-10-23 ENCOUNTER — Ambulatory Visit: Admission: EM | Admit: 2023-10-23 | Discharge: 2023-10-23 | Disposition: A | Discharge status: Home / Self Care

## 2023-10-23 ENCOUNTER — Encounter: Payer: Self-pay | Admitting: Emergency Medicine

## 2023-10-23 DIAGNOSIS — R051 Acute cough: Secondary | ICD-10-CM

## 2023-10-23 DIAGNOSIS — J069 Acute upper respiratory infection, unspecified: Secondary | ICD-10-CM

## 2023-10-23 DIAGNOSIS — J3489 Other specified disorders of nose and nasal sinuses: Secondary | ICD-10-CM

## 2023-10-23 LAB — POC SOFIA SARS ANTIGEN FIA: SARS Coronavirus 2 Ag: NEGATIVE

## 2023-10-23 MED ORDER — AZELASTINE HCL 0.1 % NA SOLN: 2.0000 | Freq: Two times a day (BID) | NASAL | 12 refills | Status: AC

## 2023-10-23 MED ORDER — BENZONATATE 200 MG PO CAPS: 200.0000 mg | ORAL_CAPSULE | Freq: Three times a day (TID) | ORAL | 0 refills | Status: DC | PRN

## 2023-10-23 MED ORDER — CETIRIZINE-PSEUDOEPHEDRINE ER 5-120 MG PO TB12: 1.0000 | ORAL_TABLET | Freq: Every day | ORAL | 0 refills | Status: AC

## 2023-10-23 NOTE — Discharge Instructions (Addendum)
 Your COVID test was negative. Your symptoms are most likely caused by a respiratory infection, which affects areas like your nose, throat, or lungs. This type of infection is usually caused by a virus. Since your illness is caused by a virus, antibiotics won't help because they only treat infections caused by bacteria.  Take the medications that were prescribed to you as directed. If you have a fever, headache, or body aches, you can also take Tylenol  or ibuprofen to help you feel more comfortable. Be sure to drink plenty of fluids to stay hydrated--aim for enough to keep your urine a pale yellow color. This will also help to thin mucus and make it easier to clear from your body.   Using a cool mist humidifier at home to keep humidity levels above 50% can be helpful. You can also inhale steam for 10 to 15 minutes, 3 to 4 times a day. This can be done by sitting in the bathroom with a hot shower running, or by using over-the-counter vapor shower tablets to help with nasal congestion. Try to avoid cool or dry air as much as possible. When you sleep, keep your head elevated to help reduce post-nasal drainage. Be sure to get enough rest every night to support your recovery.Don't forget to replace your toothbrush once you start feeling better.   It's normal for a cough to linger for several weeks after a respiratory illness, even after other symptoms have resolved. This happens because the airways remain irritated and take time to fully heal. As long as the cough gradually improves and there are no new concerning symptoms, this is part of the normal recovery process.  If your symptoms get worse or if you develop any new or concerning symptoms, go to the emergency room right away. If you're not feeling better in a few days, follow up with your primary care provider.

## 2023-10-23 NOTE — ED Provider Notes (Signed)
 [facility]    CSN: [id number] Arrival date & time: 10/23/23  0815      History   Chief Complaint Chief Complaint  Patient presents with   Sore Throat   Nasal Congestion    HPI Courtney [name] is a 68 y.o. female.   Discussed the use of AI scribe software for clinical note transcription with the patient, who gave verbal consent to proceed.   Patient with a history of diabetes and prior sinus surgery, presents with upper respiratory symptoms that began after returning from a trip to [place]. The patient reports that her symptoms started on October 21, 2023, the day after returning from her travels.  [name] primary complaints include a scratchy and itchy throat, cough, and runny nose. The cough is accompanied by a sensation in her chest, though she denies chest tightness. The patient notes that she can produce clear sputum when coughing and has clear nasal discharge. She also reports experiencing nasal congestion, postnasal drip, and sneezing. Additionally, the patient mentions having a headache and body aches. The patient believes her symptoms may be related to exposure during her flight, where she sat next to a man who was coughing without covering his mouth. To manage her symptoms, the patient has been using Tylenol , gargling with salt water , and using M&T antidote lozenges. She has also been drinking tea and coffee. The patient denies experiencing fever, chills, wheezing, shortness of breath, nausea, vomiting, or diarrhea.   The following portions of the patient's history were reviewed and updated as appropriate: allergies, current medications, past family history, past medical history, past social history, past surgical history, and problem list.     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   Bowel obstruction  (HCC)    Depression with anxiety 02/09/2015   mild xanax  prn   Diabetes mellitus (HCC) 07/18/2015   type 2   Diverticulitis    Dysrhythmia    abnormal ekgs worked up   Essential tremor 03/31/2016   Fibromyalgia    MS   Gait difficulty 02/09/2015   Gallstones    GERD (gastroesophageal reflux disease)    HLD (hyperlipidemia)    Hypersomnia 11/09/2015   Hypertension    no meds   Hyperthyroidism    patient states has no problems at present-01/25/2019   Hypothyroidism    patient states no problems at present 01/25/2019   IBS (irritable bowel syndrome)    Insomnia 02/09/2015   Migraines    MS (multiple sclerosis) (HCC)    Multiple joint pain 03/31/2016   Numbness in both hands 03/18/2016   Other fatigue 02/09/2015   Peptic ulcer    Pneumonia    multiple times. Last time 2014   PONV (postoperative nausea and vomiting)    Urinary hesitancy 02/09/2015   Vision abnormalities     Patient Active Problem List   Diagnosis Date Noted   Myalgia, other site 08/18/2023   Carpal tunnel syndrome of right wrist 01/19/2023   Closed fracture of shaft of fourth metacarpal bone of left hand 01/19/2023   SBO (small bowel obstruction) (HCC) 11/14/2022   H/O umbilical hernia repair with mesh placement 05/2022 11/14/2022   AKI (acute kidney injury) (HCC) 11/14/2022   Fracture of fourth metacarpal bone of right hand 10/26/2022   Hand pain 10/26/2022   Polyp  of rectum 04/24/2022   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   Open comedone 12/10/2021   Supraumbilical hernia 09/25/2021   Comedone 05/03/2021   Dermatofibroma 05/03/2021   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   Milia 05/03/2021   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   Sebaceous cyst 05/03/2021   Seborrheic dermatitis of scalp 05/03/2021   Biceps tendonitis on left 04/30/2021   CKD stage 3a, GFR 45-59 ml/min (HCC) 10/21/2020   Insulin  dependent type 2 diabetes mellitus (HCC) 10/21/2020   Type 2 diabetes mellitus with diabetic polyneuropathy,  with long-term current use of insulin  (HCC) 06/20/2020   Dyslipidemia, goal LDL below 70 04/10/2020   Episode of recurrent major depressive disorder (HCC) 04/10/2020   Pain in left shoulder 04/04/2020   Vaginal dryness, menopausal 07/07/2019   Tremor 05/30/2019   Memory loss 05/03/2019   Hx of adenomatous colonic polyps 03/16/2019   History of colonic polyps 03/16/2019   Fracture of distal phalanx of right ring finger 03/15/2019   Facet arthritis, degenerative, 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   Polyp of transverse colon    Special screening for malignant neoplasms, colon    Polyp of sigmoid colon    Anxiety 09/09/2018   GERD (gastroesophageal reflux disease) 09/09/2018   Cervicalgia 08/10/2018   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   Laryngopharyngeal reflux (LPR) 05/14/2018   Throat pain 05/14/2018   Loss of weight    RLQ abdominal pain 03/27/2017   Bloating 03/27/2017   Abnormal finding on imaging 03/27/2017   Dilation of biliary tract 03/27/2017   Nausea 03/27/2017   Chronic sinusitis 03/19/2017   Multiple joint pain 03/31/2016   Essential tremor 03/31/2016   Numbness in both hands 03/18/2016   Hypersomnia 11/09/2015   Diabetes mellitus (HCC) 07/18/2015   Abnormal ECG 07/18/2015   Gait difficulty 02/09/2015   Chronic left shoulder pain 02/09/2015   Generalized anxiety disorder 02/09/2015   Numbness 02/09/2015   Insomnia 02/09/2015   Urinary hesitancy 02/09/2015   Other fatigue 02/09/2015   Fibromyalgia 02/09/2015   Depression 02/09/2015   Essential hypertension 09/01/2011   Coronary artery disease involving native coronary artery of native heart without angina pectoris 09/01/2011   Parkinson's disease (HCC) 09/01/2011   Type 2 diabetes mellitus treated with insulin  (HCC) 09/01/2011   Chronic pain 09/01/2011    Past Surgical History:  Procedure Laterality Date   ABDOMINAL HYSTERECTOMY  1976   including oophorectomy, due to uterine cancer   APPENDECTOMY  1974    BIOPSY  01/31/2019   Procedure: BIOPSY;  Surgeon: Shila Gustav GAILS, MD;  Location: [facility];  Service: Endoscopy;;   CARDIAC CATHETERIZATION     2001/2006 [place]. Normal per patient   CARPAL TUNNEL RELEASE Left    carpal tunnel surgery   CHOLECYSTECTOMY     2006   COLON SURGERY     SBO x 2   COLONOSCOPY WITH PROPOFOL  N/A 01/31/2019   Procedure: COLONOSCOPY WITH PROPOFOL ;  Surgeon: Shila Gustav GAILS, MD;  Location: [facility];  Service: Endoscopy;  Laterality: N/A;   COLONOSCOPY WITH PROPOFOL  N/A 04/24/2022   Procedure: COLONOSCOPY WITH PROPOFOL ;  Surgeon: Shila Gustav GAILS, MD;  Location: [facility];  Service: Gastroenterology;  Laterality: N/A;   EUS N/A 04/09/2017   Procedure: UPPER ENDOSCOPIC ULTRASOUND (EUS) LINEAR;  Surgeon: Teressa Toribio SQUIBB, MD;  Location: [facility];  Service: Endoscopy;  Laterality: N/A;   HERNIA REPAIR     INCISIONAL HERNIA REPAIR N/A 05/05/2022   Procedure: LAPAROSCOPIC VENTRAL HERNA REPAIR WITH MESH;  Surgeon: Dasie Best  L, MD;  Location: [facility];  Service: General;  Laterality: N/A;   INSERTION OF MESH N/A 05/05/2022   Procedure: INSERTION OF MESH;  Surgeon: Dasie Leonor CROME, MD;  Location: [facility];  Service: General;  Laterality: N/A;   MENISCUS REPAIR Left    POLYPECTOMY  01/31/2019   Procedure: POLYPECTOMY;  Surgeon: Shila Gustav GAILS, MD;  Location: [facility];  Service: Endoscopy;;   POLYPECTOMY  04/24/2022   Procedure: POLYPECTOMY;  Surgeon: Shila Gustav GAILS, MD;  Location: [facility];  Service: Gastroenterology;;   SMALL INTESTINE SURGERY     toenail removal Right 12/2017   great toenail     OB History     Gravida  2   Para      Term      Preterm      AB      Living  2      SAB      IAB      Ectopic      Multiple      Live Births  2            Home Medications    Prior to Admission medications   Medication Sig Start Date End Date Taking? Authorizing Provider  acetaminophen  (TYLENOL ) 500  MG tablet Take 1,000 mg by mouth every 8 (eight) hours as needed for headache.   Yes [provider]  ALPRAZolam  (XANAX ) 1 MG tablet Take 1 mg by mouth 3 (three) times daily as needed for anxiety.   Yes [provider]  azelastine  (ASTELIN ) 0.1 % nasal spray Place 2 sprays into both nostrils 2 (two) times daily. Use in each nostril as directed 10/23/23  Yes Iola Lukes, FNP  benzonatate  (TESSALON ) 200 MG capsule Take 1 capsule (200 mg total) by mouth 3 (three) times daily as needed for cough. 10/23/23  Yes Iola Lukes, FNP  Blood Glucose Monitoring Suppl (FREESTYLE LITE) w/Device KIT Use to check blood sugars up to 4 times daily 04/23/23  Yes Shamleffer, Ibtehal Jaralla, MD  cetirizine -pseudoephedrine  (ZYRTEC -D) 5-120 MG tablet Take 1 tablet by mouth daily with breakfast for 10 days. 10/23/23 11/02/23 Yes Iola Lukes, FNP  Continuous Blood Gluc Sensor (DEXCOM G7 SENSOR) MISC 1 Device by Does not apply route as directed. 06/06/22  Yes Shamleffer, Ibtehal Jaralla, MD  empagliflozin  (JARDIANCE ) 25 MG TABS tablet TAKE 1 TABLET DAILY 06/02/23  Yes Shamleffer, Ibtehal Jaralla, MD  glucose blood (ONETOUCH VERIO) test strip Check blood sugar 2 times daily 09/15/23  Yes Shamleffer, Ibtehal Jaralla, MD  Insulin  Disposable Pump (OMNIPOD 5 G7 INTRO, GEN 5,) KIT 1 Device by Does not apply route every other day. 07/21/23  Yes Shamleffer, Ibtehal Jaralla, MD  Lancets [facility] ULTRASOFT) lancets Check blood sugar 2 times daily 09/10/23  Yes Shamleffer, Ibtehal Jaralla, MD  latanoprost (XALATAN) 0.005 % ophthalmic solution Place 1 drop into both eyes at bedtime. 02/06/22  Yes [provider]  loratadine (CLARITIN) 10 MG tablet Take 10 mg by mouth daily as needed for allergies. 01/05/20  Yes [provider]  modafinil (PROVIGIL) 100 MG tablet Take 25 mg by mouth daily as needed (Takes to help stay awake due to insomnia per patient). 02/19/22  Yes [provider]  Multiple  Vitamin (MULTI-VITAMIN) tablet Take by mouth daily.   Yes [provider]  mupirocin  ointment (BACTROBAN ) 2 % Apply 1 Application topically 2 (two) times daily. 04/03/23  Yes Lorren, Amy J, NP  oxyCODONE  (ROXICODONE ) 15 MG immediate release tablet Take 15  mg by mouth every 6 (six) hours as needed for pain. 08/02/21  Yes [provider]  pantoprazole  (PROTONIX ) 40 MG tablet Take 1 tablet (40 mg total) by mouth daily. Please schedule a yearly follow up for further refills. Thank you 07/13/23  Yes Kennedy-Smith, Colleen M, NP  Vitamin D, Ergocalciferol, (DRISDOL) 1.25 MG (50000 UNIT) CAPS capsule Take 50,000 Units by mouth once a week. 10/06/22  Yes [provider]  cyclobenzaprine (FLEXERIL) 10 MG tablet Take 10 mg by mouth 3 (three) times daily as needed for muscle spasms. Patient not taking: Reported on 10/23/2023 09/26/22   [provider]  fluconazole  (DIFLUCAN ) 100 MG tablet Take 1 tablet (100 mg total) by mouth daily. Patient not taking: Reported on 10/23/2023 04/03/23   Lorren Greig PARAS, NP  insulin  aspart (NOVOLOG  FLEXPEN) 100 UNIT/ML FlexPen Inject 6 Units into the skin 3 (three) times daily with meals. Patient not taking: Reported on 10/23/2023 10/09/23   Shamleffer, Ibtehal Jaralla, MD  insulin  aspart (NOVOLOG ) 100 UNIT/ML injection Max daily 60 units through the pump Patient not taking: Reported on 10/23/2023 08/12/23   Shamleffer, Ibtehal Jaralla, MD  insulin  glargine (LANTUS  SOLOSTAR) 100 UNIT/ML Solostar Pen Inject 14 Units into the skin daily. Patient not taking: Reported on 10/23/2023 10/09/23   Shamleffer, Ibtehal Jaralla, MD  Insulin  Pen Needle 32G X 4 MM MISC 1 Device by Does not apply route in the morning, at noon, in the evening, and at bedtime. Patient not taking: Reported on 10/23/2023 10/09/23   Shamleffer, Ibtehal Jaralla, MD  lidocaine  (XYLOCAINE ) 5 % ointment Apply topically daily. Arm pain Patient not taking: Reported on 10/23/2023 10/27/18   [provider]  naloxone [facility]) nasal spray 4 mg/0.1 mL Place 1 spray into the nose daily as needed (overdose). Patient not taking: Reported on 10/23/2023    [provider]  predniSONE  (DELTASONE ) 20 MG tablet Take 20 mg by mouth daily. Patient not taking: Reported on 10/23/2023 09/15/23   [provider]  pregabalin  (LYRICA ) 50 MG capsule daily. Patient not taking: Reported on 10/23/2023    [provider]  terconazole  (TERAZOL 3 ) 0.8 % vaginal cream Place 1 applicator vaginally at bedtime. Patient not taking: Reported on 10/23/2023 12/04/22   [provider]  tirzepatide  (MOUNJARO ) 2.5 MG/0.5ML Pen Inject 2.5 mg into the skin once a week. Patient not taking: Reported on 10/23/2023 08/12/23   Shamleffer, Ibtehal Jaralla, MD  tiZANidine (ZANAFLEX) 4 MG tablet Take 4 mg by mouth 3 (three) times daily as needed. Patient not taking: Reported on 10/23/2023 09/17/23   [provider]    Family History Family History  Problem Relation Age of Onset   Heart disease Mother    Kidney disease Mother    Diabetes Mother    Other Father 63       murdered   Multiple sclerosis Daughter    Multiple sclerosis Other    Diabetes Sister    Alcohol  abuse Brother        ETOH and marijuana   Diabetes Brother    Diabetes Sister    Diabetes Brother    ALS Brother     Social History Social History   Tobacco Use   Smoking status: Former    Current packs/day: 0.00    Average packs/day: 0.3 packs/day for 5.0 years (1.3 ttl pk-yrs)    Types: Cigarettes    Start date: 75    Quit date: 1990    Years since quitting: 35.6  Passive exposure: Never   Smokeless tobacco: Never  Vaping Use   Vaping status: Never Used  Substance Use Topics   Alcohol  use: Not Currently    Comment: rarely   Drug use: Yes    Types: Oxycodone      Allergies   Clarithromycin, Ibuprofen, Iodinated contrast media, Liraglutide, Methylprednisolone , Naproxen, Nsaids, Prednisone ,  Semaglutide , Sulfa antibiotics, Sulfamethoxazole-trimethoprim, Metformin, Aspirin, Hydromorphone , and Ozempic  (0.25 or 0.5 mg-dose) [semaglutide (0.25 or 0.5mg -dos)]   Review of Systems Review of Systems  Constitutional:  Negative for chills and fever.  HENT:  Positive for congestion, rhinorrhea and sneezing. Negative for sore throat (itchy).   Respiratory:  Positive for cough (productive). Negative for shortness of breath and wheezing.   Gastrointestinal:  Negative for diarrhea, nausea and vomiting.  Musculoskeletal:  Positive for myalgias.  Neurological:  Positive for headaches.  All other systems reviewed and are negative.    Physical Exam Triage Vital Signs ED Triage Vitals [10/23/23 0958]  Encounter Vitals Group     BP (!) 154/83     Girls Systolic BP Percentile      Girls Diastolic BP Percentile      Boys Systolic BP Percentile      Boys Diastolic BP Percentile      Pulse Rate 63     Resp 20     Temp 97.9 F (36.6 C)     Temp Source Oral     SpO2 98 %     Weight      Height      Head Circumference      Peak Flow      Pain Score      Pain Loc      Pain Education      Exclude from Growth Chart    No data found.  Updated Vital Signs BP (!) 154/83 (BP Location: Left Arm)   Pulse 63   Temp 97.9 F (36.6 C) (Oral)   Resp 20   SpO2 98%   Visual Acuity Right Eye Distance:   Left Eye Distance:   Bilateral Distance:    Right Eye Near:   Left Eye Near:    Bilateral Near:     Physical Exam Vitals reviewed.  Constitutional:      General: She is awake. She is not in acute distress.    Appearance: Normal appearance. She is well-developed and well-groomed. She is not ill-appearing, toxic-appearing or diaphoretic.  HENT:     Head: Normocephalic.     Right Ear: Tympanic membrane, ear canal and external ear normal. No drainage, swelling or tenderness. No middle ear effusion. Tympanic membrane is not erythematous.     Left Ear: Tympanic membrane, ear canal and  external ear normal. No drainage, swelling or tenderness.  No middle ear effusion. Tympanic membrane is not erythematous.     Nose: Congestion present. No rhinorrhea.     Mouth/Throat:     Lips: Pink.     Mouth: Mucous membranes are moist.     Pharynx: Oropharynx is clear. Uvula midline. No pharyngeal swelling, oropharyngeal exudate, posterior oropharyngeal erythema or uvula swelling.     Tonsils: No tonsillar exudate or tonsillar abscesses.  Eyes:     General: Vision grossly intact.     Conjunctiva/sclera: Conjunctivae normal.  Cardiovascular:     Rate and Rhythm: Normal rate.     Heart sounds: Normal heart sounds.  Pulmonary:     Effort: Pulmonary effort is normal. No tachypnea or respiratory distress.     Breath sounds:  Normal breath sounds and air entry.  Musculoskeletal:        General: Normal range of motion.     Cervical back: Normal range of motion and neck supple.  Lymphadenopathy:     Cervical: No cervical adenopathy.  Skin:    General: Skin is warm and dry.  Neurological:     General: No focal deficit present.     Mental Status: She is alert and oriented to person, place, and time.  Psychiatric:        Behavior: Behavior is cooperative.      UC Treatments / Results  Labs (all labs ordered are listed, but only abnormal results are displayed) Labs Reviewed  POC SOFIA SARS ANTIGEN FIA    EKG   Radiology No results found.  Procedures Procedures (including critical care time)  Medications Ordered in UC Medications - No data to display  Initial Impression / Assessment and Plan / UC Course  I have reviewed the triage vital signs and the nursing notes.  Pertinent labs & imaging results that were available during my care of the patient were reviewed by me and considered in my medical decision making (see chart for details).     Patient presents with symptoms consistent with a viral respiratory infection. Clinical findings and history do not suggest a  bacterial process, and antibiotics are not indicated at this time. Take any medications as prescribed, increase fluid intake, rest, and use supportive measures such as over-the-counter medications for fever, congestion, or discomfort as needed. Symptoms are expected to improve gradually over the next several days. The patient was instructed to follow up with their primary care provider if symptoms persist beyond 7-10 days, worsen after initial improvement, or if recurrent fever develops. Emergency evaluation was advised for severe shortness of breath, chest pain, confusion, persistent high fever, or any sudden deterioration in condition.  Today's evaluation has revealed no signs of a dangerous process. Discussed diagnosis with patient and/or guardian. Patient and/or guardian aware of their diagnosis, possible red flag symptoms to watch out for and need for close follow up. Patient and/or guardian understands verbal and written discharge instructions. Patient and/or guardian comfortable with plan and disposition.  Patient and/or guardian has a clear mental status at this time, good insight into illness (after discussion and teaching) and has clear judgment to make decisions regarding their care  Documentation was completed with the aid of voice recognition software. Transcription may contain typographical errors. Final Clinical Impressions(s) / UC Diagnoses   Final diagnoses:  Rhinorrhea  Acute cough  Viral upper respiratory tract infection     Discharge Instructions      Your COVID test was negative. Your symptoms are most likely caused by a respiratory infection, which affects areas like your nose, throat, or lungs. This type of infection is usually caused by a virus. Since your illness is caused by a virus, antibiotics won't help because they only treat infections caused by bacteria.  Take the medications that were prescribed to you as directed. If you have a fever, headache, or body aches, you  can also take Tylenol  or ibuprofen to help you feel more comfortable. Be sure to drink plenty of fluids to stay hydrated--aim for enough to keep your urine a pale yellow color. This will also help to thin mucus and make it easier to clear from your body.   Using a cool mist humidifier at home to keep humidity levels above 50% can be helpful. You can also inhale steam for  10 to 15 minutes, 3 to 4 times a day. This can be done by sitting in the bathroom with a hot shower running, or by using over-the-counter vapor shower tablets to help with nasal congestion. Try to avoid cool or dry air as much as possible. When you sleep, keep your head elevated to help reduce post-nasal drainage. Be sure to get enough rest every night to support your recovery.Don't forget to replace your toothbrush once you start feeling better.   It's normal for a cough to linger for several weeks after a respiratory illness, even after other symptoms have resolved. This happens because the airways remain irritated and take time to fully heal. As long as the cough gradually improves and there are no new concerning symptoms, this is part of the normal recovery process.  If your symptoms get worse or if you develop any new or concerning symptoms, go to the emergency room right away. If you're not feeling better in a few days, follow up with your primary care provider.            ED Prescriptions     Medication Sig Dispense Auth. Provider   benzonatate  (TESSALON ) 200 MG capsule Take 1 capsule (200 mg total) by mouth 3 (three) times daily as needed for cough. 30 capsule Iola Lukes, FNP   azelastine  (ASTELIN ) 0.1 % nasal spray Place 2 sprays into both nostrils 2 (two) times daily. Use in each nostril as directed 30 mL Iola Lukes, FNP   cetirizine -pseudoephedrine  (ZYRTEC -D) 5-120 MG tablet Take 1 tablet by mouth daily with breakfast for 10 days. 10 tablet Iola Lukes, FNP      PDMP not reviewed this  encounter.   Iola Lukes, [place] 10/23/23 1057

## 2023-10-26 DIAGNOSIS — M5412 Radiculopathy, cervical region: Secondary | ICD-10-CM | POA: Diagnosis not present

## 2023-11-04 ENCOUNTER — Ambulatory Visit: Payer: Self-pay

## 2023-11-04 NOTE — Telephone Encounter (Signed)
 FYI Only or Action Required?: FYI only for provider.  Patient was last seen in primary care on 11/26/2022 by Tanda Bleacher, MD.  Called Nurse Triage reporting Fatigue.  Symptoms began ongoing problem worsening recently.  Symptoms are: gradually worsening.  Triage Disposition: See PCP When Office is Open (Within 3 Days)  Patient/caregiver understands and will follow disposition?: Yes       Copied from CRM #[id number]. Topic: Clinical - Red Word Triage >> Nov 04, 2023 10:58 AM Antwanette L wrote: Red Word that prompted transfer to Nurse Triage: patient is experiencing  fatigue, shoulder, neck, and back pain,  pt cannot sleep,and frequent urination(every 2 hours). The patient is a diabetic. She needs a bp check        Reason for Disposition  [1] Fatigue (i.e., tires easily, decreased energy) AND [2] persists > 1 week  Answer Assessment - Initial Assessment Questions 1. DESCRIPTION: Describe how you are feeling.     I feel tired, but I haven't been sleeping well 2. SEVERITY: How bad is it?  Can you stand and walk?     Fatigue, no weakness  3. ONSET: When did these symptoms begin? (e.g., hours, days, weeks, months)     Ongoing problem 4. CAUSE: What do you think is causing the weakness or fatigue? (e.g., not drinking enough fluids, medical problem, trouble sleeping)     Unsure  5. NEW MEDICINES:  Have you started on any new medicines recently? (e.g., opioid pain medicines, benzodiazepines, muscle relaxants, antidepressants, antihistamines, neuroleptics, beta blockers)     No 6. OTHER SYMPTOMS: Do you have any other symptoms? (e.g., chest pain, fever, cough, SOB, vomiting, diarrhea, bleeding, other areas of pain)     Shoulder pain, back pain, neck pain  Protocols used: Weakness (Generalized) and Fatigue-A-AH

## 2023-11-05 ENCOUNTER — Ambulatory Visit (INDEPENDENT_AMBULATORY_CARE_PROVIDER_SITE_OTHER): Payer: Self-pay | Admitting: Nurse Practitioner

## 2023-11-05 ENCOUNTER — Encounter: Payer: Self-pay | Admitting: Nurse Practitioner

## 2023-11-05 VITALS — BP 139/88 | HR 65 | Temp 98.1°F | Wt 159.6 lb

## 2023-11-05 DIAGNOSIS — E559 Vitamin D deficiency, unspecified: Secondary | ICD-10-CM

## 2023-11-05 DIAGNOSIS — N390 Urinary tract infection, site not specified: Secondary | ICD-10-CM | POA: Diagnosis not present

## 2023-11-05 DIAGNOSIS — E1142 Type 2 diabetes mellitus with diabetic polyneuropathy: Secondary | ICD-10-CM | POA: Diagnosis not present

## 2023-11-05 DIAGNOSIS — Z794 Long term (current) use of insulin: Secondary | ICD-10-CM

## 2023-11-05 DIAGNOSIS — E538 Deficiency of other specified B group vitamins: Secondary | ICD-10-CM

## 2023-11-05 DIAGNOSIS — Z1329 Encounter for screening for other suspected endocrine disorder: Secondary | ICD-10-CM

## 2023-11-05 DIAGNOSIS — R5383 Other fatigue: Secondary | ICD-10-CM | POA: Diagnosis not present

## 2023-11-05 DIAGNOSIS — I471 Supraventricular tachycardia, unspecified: Secondary | ICD-10-CM | POA: Diagnosis not present

## 2023-11-05 DIAGNOSIS — I422 Other hypertrophic cardiomyopathy: Secondary | ICD-10-CM | POA: Diagnosis not present

## 2023-11-05 DIAGNOSIS — E785 Hyperlipidemia, unspecified: Secondary | ICD-10-CM | POA: Diagnosis not present

## 2023-11-05 LAB — POCT URINALYSIS DIP (CLINITEK)
Bilirubin, UA: NEGATIVE
Glucose, UA: >=1000 mg/dL — AB
Nitrite, UA: NEGATIVE
POC PROTEIN,UA: NEGATIVE
Spec Grav, UA: 1.015 (ref 1.010–1.025)
Urobilinogen, UA: 0.2 U/dL
pH, UA: 5 (ref 5.0–8.0)

## 2023-11-05 LAB — POCT GLYCOSYLATED HEMOGLOBIN (HGB A1C): Hemoglobin A1C: 6.8 % — AB (ref 4.0–5.6)

## 2023-11-05 MED ORDER — NITROFURANTOIN MONOHYD MACRO 100 MG PO CAPS: 100.0000 mg | ORAL_CAPSULE | Freq: Two times a day (BID) | ORAL | 0 refills | Status: AC

## 2023-11-05 NOTE — Telephone Encounter (Signed)
 I called patient with pcp recommendations no one answered so I left a voicemail to return my call.

## 2023-11-05 NOTE — Telephone Encounter (Signed)
 Report to Emergency Department/Urgent Care/call 911 for immediate medical evaluation. Follow-up with Primary Care.

## 2023-11-05 NOTE — Progress Notes (Signed)
 Subjective   Patient ID: Courtney [name], female    DOB: 20-Jun-1955, 68 y.o.   MRN: [id number]  Chief Complaint  Patient presents with   Fatigue    Patient stated that she has missed a couple a doses of her VitD    Referring provider: Lorren Greig PARAS, NP  Courtney [name] is a 67 y.o. 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 Once               she left that position, she had no issues No date: Bowel obstruction (HCC) 02/09/2015: Depression with anxiety     Comment:  mild xanax  prn 07/18/2015: Diabetes mellitus (HCC)     Comment:  type 2 No date: Diverticulitis No date: Dysrhythmia     Comment:  abnormal ekgs worked up 03/31/2016: Essential tremor No date: Fibromyalgia     Comment:  MS 02/09/2015: Gait difficulty No date: Gallstones No date: GERD (gastroesophageal reflux disease) No date: HLD (hyperlipidemia) 11/09/2015: Hypersomnia No date: Hypertension     Comment:  no meds No date: Hyperthyroidism     Comment:  patient states has no problems at present-01/25/2019 No date: Hypothyroidism     Comment:  patient states no problems at present 01/25/2019 No date: IBS (irritable bowel syndrome) 02/09/2015: Insomnia No date: Migraines No date: MS (multiple sclerosis) (HCC) 03/31/2016: Multiple joint pain 03/18/2016: Numbness in both hands 02/09/2015: Other fatigue No date: Peptic ulcer No date: Pneumonia     Comment:  multiple times. Last time 2014 No date: PONV (postoperative nausea and vomiting) 02/09/2015: Urinary hesitancy No date: Vision abnormalities   HPI  Patient presents today for acute visit.  She is a patient of Greig Gravely.  Patient complains of fatigue for the past few weeks.  She is requesting that her thyroid  levels be checked.  She is followed by cardiology and does have an appointment with them today.  She is also  followed by pain management through [facility] and followed by nephrology.  She does have a history of diabetes.  A1c is stable in office today. Denies f/c/s, n/v/d, hemoptysis, PND, leg swelling Denies chest pain or edema     Allergies  Allergen Reactions   Clarithromycin Swelling   Ibuprofen Other (See Comments)    Developed a ulcer  Other Reaction(s): Not available  ibuprofen  Other reaction(s): Other (See Comments) Developed a ulcer     Developed a ulcer     ibuprofen    Other reaction(s): Other (See Comments) Developed a ulcer   Developed a ulcer  Other reaction(s): Other (See Comments) Developed a ulcer   Other reaction(s): Other (See Comments) Developed a ulcer    Other reaction(s): Other (See Comments), Developed a ulcer   Iodinated Contrast Media Hives   Liraglutide Nausea And Vomiting, Nausea Only and Other (See Comments)    liraglutide   Methylprednisolone  Anaphylaxis, Palpitations, Shortness Of Breath and Anxiety    Also reports tongue swelling, rash and facial redness  Pt stated she has had other steroids w/no issue.  But Can not take methylprednisolone   Pt states it affected her mood  Lack of consciousness   Naproxen Other (See Comments)    Developed a ulcer  Other Reaction(s): Not available  naproxen  Other reaction(s): Other (See Comments) Developed a ulcer     Developed a ulcer     naproxen  Developed a ulcer   Nsaids Other (See Comments)    Pt gets Ulcers    Prednisone  Anaphylaxis, Palpitations, Shortness Of Breath and Hives    prednisone    Semaglutide  Dermatitis and Itching    Other Reaction(s): Unknown   Sulfa Antibiotics Other (See Comments), Swelling and Anaphylaxis    Angioedema  Other Reaction(s): Unknown   Sulfamethoxazole-Trimethoprim Other (See Comments)    blotchiness redness, face swelling  Other Reaction(s): Not available  sulfamethoxazole / trimethoprim   Metformin Diarrhea and Other (See Comments)   Aspirin      Other Reaction(s): Not available, Unknown  aspirin   Hydromorphone  Other (See Comments)   Ozempic  (0.25 Or 0.5 Mg-Dose) [Semaglutide (0.25 Or 0.5mg -Dos)] Rash    Immunization History  Administered Date(s) Administered   Fluad Quad(high Dose 65+) 11/10/2021   Influenza, Seasonal, Injecte, Preservative Fre 12/23/2016   Influenza,inj,Quad PF,6+ Mos 10/27/2018, 11/21/2019   Influenza-Unspecified 11/21/2015, 12/01/2017, 11/09/2020   PFIZER(Purple Top)SARS-COV-2 Vaccination 05/10/2019, 05/31/2019, 03/17/2021   Pneumococcal Polysaccharide-23 11/25/2017, 05/03/2021   Tdap 11/21/2019   Zoster Recombinant(Shingrix) 01/13/2018, 10/27/2018    Tobacco History: Social History   Tobacco Use  Smoking Status Former   Current packs/day: 0.00   Average packs/day: 0.3 packs/day for 5.0 years (1.3 ttl pk-yrs)   Types: Cigarettes   Start date: 40   Quit date: 64   Years since quitting: 35.6   Passive exposure: Never  Smokeless Tobacco Never   Counseling given: Not Answered   Outpatient Encounter Medications as of 11/05/2023  Medication Sig   acetaminophen  (TYLENOL ) 500 MG tablet Take 1,000 mg by mouth every 8 (eight) hours as needed for headache.   ALPRAZolam  (XANAX ) 1 MG tablet Take 1 mg by mouth 3 (three) times daily as needed for anxiety.   azelastine  (ASTELIN ) 0.1 % nasal spray Place 2 sprays into both nostrils 2 (two) times daily. Use in each nostril as directed   Blood Glucose Monitoring Suppl (FREESTYLE LITE) w/Device KIT Use to check blood sugars up to 4 times daily   Continuous Blood Gluc Sensor (DEXCOM G7 SENSOR) MISC 1 Device by Does not apply route as directed.   empagliflozin  (JARDIANCE ) 25 MG TABS tablet TAKE 1 TABLET DAILY   glucose blood (ONETOUCH VERIO) test strip Check blood sugar 2 times daily   Insulin  Disposable Pump (OMNIPOD 5 G7 INTRO, GEN 5,) KIT 1 Device by Does not apply route every other day.   latanoprost (XALATAN) 0.005 % ophthalmic solution Place 1 drop into  both eyes at bedtime.   loratadine (CLARITIN) 10 MG tablet Take 10 mg by mouth daily as needed for allergies.   modafinil (PROVIGIL) 100 MG tablet Take 25 mg by mouth daily as needed (Takes to help stay awake due to insomnia per patient).   Multiple Vitamin (MULTI-VITAMIN) tablet Take by mouth daily.   mupirocin  ointment (BACTROBAN ) 2 % Apply 1 Application topically 2 (two) times daily.   oxyCODONE  (ROXICODONE ) 15 MG immediate release tablet Take 15 mg by mouth every 6 (six) hours as needed for pain.   pantoprazole  (PROTONIX ) 40 MG tablet Take 1 tablet (40 mg total) by mouth daily. Please schedule a yearly follow up for further refills. Thank you   Vitamin D , Ergocalciferol , (DRISDOL) 1.25 MG (50000 UNIT) CAPS capsule Take 50,000 Units by mouth once a week.   benzonatate  (TESSALON ) 200 MG capsule Take 1 capsule (200 mg total) by mouth 3 (three) times daily as needed for cough. (Patient not taking: Reported on 11/05/2023)   cyclobenzaprine (FLEXERIL) 10 MG  tablet Take 10 mg by mouth 3 (three) times daily as needed for muscle spasms. (Patient not taking: Reported on 10/23/2023)   fluconazole  (DIFLUCAN ) 100 MG tablet Take 1 tablet (100 mg total) by mouth daily. (Patient not taking: Reported on 10/23/2023)   insulin  aspart (NOVOLOG  FLEXPEN) 100 UNIT/ML FlexPen Inject 6 Units into the skin 3 (three) times daily with meals. (Patient not taking: Reported on 10/23/2023)   insulin  aspart (NOVOLOG ) 100 UNIT/ML injection Max daily 60 units through the pump (Patient not taking: Reported on 10/23/2023)   insulin  glargine (LANTUS  SOLOSTAR) 100 UNIT/ML Solostar Pen Inject 14 Units into the skin daily. (Patient not taking: Reported on 10/23/2023)   Insulin  Pen Needle 32G X 4 MM MISC 1 Device by Does not apply route in the morning, at noon, in the evening, and at bedtime. (Patient not taking: Reported on 10/23/2023)   Lancets (ONETOUCH ULTRASOFT) lancets Check blood sugar 2 times daily (Patient not taking: Reported on  11/05/2023)   lidocaine  (XYLOCAINE ) 5 % ointment Apply topically daily. Arm pain (Patient not taking: Reported on 10/23/2023)   naloxone [facility]) nasal spray 4 mg/0.1 mL Place 1 spray into the nose daily as needed (overdose). (Patient not taking: Reported on 10/23/2023)   predniSONE  (DELTASONE ) 20 MG tablet Take 20 mg by mouth daily. (Patient not taking: Reported on 10/23/2023)   pregabalin  (LYRICA ) 50 MG capsule daily. (Patient not taking: Reported on 10/23/2023)   terconazole  (TERAZOL 3 ) 0.8 % vaginal cream Place 1 applicator vaginally at bedtime. (Patient not taking: Reported on 10/23/2023)   tirzepatide  (MOUNJARO ) 2.5 MG/0.5ML Pen Inject 2.5 mg into the skin once a week. (Patient not taking: Reported on 10/23/2023)   tiZANidine (ZANAFLEX) 4 MG tablet Take 4 mg by mouth 3 (three) times daily as needed. (Patient not taking: Reported on 10/23/2023)   No facility-administered encounter medications on file as of 11/05/2023.    Review of Systems  Review of Systems  Constitutional: Negative.   HENT: Negative.    Cardiovascular: Negative.   Gastrointestinal: Negative.   Allergic/Immunologic: Negative.   Neurological: Negative.   Psychiatric/Behavioral: Negative.       Objective:   BP 139/88   Pulse 65   Temp 98.1 F (36.7 C) (Oral)   Wt 159 lb 9.6 oz (72.4 kg)   SpO2 100%   BMI 28.73 kg/m   Wt Readings from Last 5 Encounters:  11/05/23 159 lb 9.6 oz (72.4 kg)  09/07/23 161 lb (73 kg)  06/15/23 161 lb (73 kg)  05/11/23 163 lb 12.8 oz (74.3 kg)  01/08/23 156 lb (70.8 kg)     Physical Exam Vitals and nursing note reviewed.  Constitutional:      General: She is not in acute distress.    Appearance: She is well-developed.  Cardiovascular:     Rate and Rhythm: Normal rate and regular rhythm.  Pulmonary:     Effort: Pulmonary effort is normal.     Breath sounds: Normal breath sounds.  Neurological:     Mental Status: She is alert and oriented to person, place, and time.        Assessment & Plan:   Type 2 diabetes mellitus with diabetic polyneuropathy, with long-term current use of insulin  (HCC) -     POCT glycosylated hemoglobin (Hb A1C) -     CBC -     Comprehensive metabolic panel with GFR  Other fatigue -     CBC -     Comprehensive metabolic panel with GFR -  POCT URINALYSIS DIP (CLINITEK)  Vitamin D  deficiency -     VITAMIN D  25 Hydroxy (Vit-D Deficiency, Fractures)  Vitamin B12 deficiency -     Vitamin B12  Thyroid  disorder screen -     Thyroid  Panel With TSH     Return in about 3 months (around 02/04/2024) for [employer].   Bascom GORMAN Borer, NP 11/05/2023

## 2023-11-06 ENCOUNTER — Ambulatory Visit: Payer: Self-pay | Admitting: Nurse Practitioner

## 2023-11-06 LAB — COMPREHENSIVE METABOLIC PANEL WITH GFR
ALT: 10 IU/L (ref 0–32)
AST: 18 IU/L (ref 0–40)
Albumin: 5 g/dL — ABNORMAL HIGH (ref 3.9–4.9)
Alkaline Phosphatase: 95 IU/L (ref 44–121)
BUN/Creatinine Ratio: 12 (ref 12–28)
BUN: 15 mg/dL (ref 8–27)
Bilirubin Total: 0.4 mg/dL (ref 0.0–1.2)
CO2: 21 mmol/L (ref 20–29)
Calcium: 9.9 mg/dL (ref 8.7–10.3)
Chloride: 99 mmol/L (ref 96–106)
Creatinine, Ser: 1.21 mg/dL — ABNORMAL HIGH (ref 0.57–1.00)
Globulin, Total: 3 g/dL (ref 1.5–4.5)
Glucose: 165 mg/dL — ABNORMAL HIGH (ref 70–99)
Potassium: 4.6 mmol/L (ref 3.5–5.2)
Sodium: 139 mmol/L (ref 134–144)
Total Protein: 8 g/dL (ref 6.0–8.5)
eGFR: 49 mL/min/1.73 — ABNORMAL LOW (ref >59)

## 2023-11-06 LAB — THYROID PANEL WITH TSH
Free Thyroxine Index: 2.1 (ref 1.2–4.9)
T3 Uptake Ratio: 27 % (ref 24–39)
T4, Total: 7.6 ug/dL (ref 4.5–12.0)
TSH: 1.32 u[IU]/mL (ref 0.450–4.500)

## 2023-11-06 LAB — LIPID PANEL
Chol/HDL Ratio: 2.8 ratio (ref 0.0–4.4)
Cholesterol, Total: 228 mg/dL — ABNORMAL HIGH (ref 100–199)
HDL: 81 mg/dL (ref >39)
LDL Chol Calc (NIH): 119 mg/dL — ABNORMAL HIGH (ref 0–99)
Triglycerides: 161 mg/dL — ABNORMAL HIGH (ref 0–149)
VLDL Cholesterol Cal: 28 mg/dL (ref 5–40)

## 2023-11-06 LAB — CBC
Hematocrit: 49.6 % — ABNORMAL HIGH (ref 34.0–46.6)
Hemoglobin: 15.3 g/dL (ref 11.1–15.9)
MCH: 27.8 pg (ref 26.6–33.0)
MCHC: 30.8 g/dL — ABNORMAL LOW (ref 31.5–35.7)
MCV: 90 fL (ref 79–97)
Platelets: 193 x10E3/uL (ref 150–450)
RBC: 5.5 x10E6/uL — ABNORMAL HIGH (ref 3.77–5.28)
RDW: 13.6 % (ref 11.7–15.4)
WBC: 6.6 x10E3/uL (ref 3.4–10.8)

## 2023-11-06 LAB — VITAMIN B12: Vitamin B-12: 325 pg/mL (ref 232–1245)

## 2023-11-06 LAB — VITAMIN D 25 HYDROXY (VIT D DEFICIENCY, FRACTURES): Vit D, 25-Hydroxy: 49.5 ng/mL (ref 30.0–100.0)

## 2023-11-06 LAB — LIPOPROTEIN A (LPA): Lipoprotein (a): 49.4 nmol/L (ref <75.0)

## 2023-11-07 LAB — URINE CULTURE

## 2023-11-10 DIAGNOSIS — H2513 Age-related nuclear cataract, bilateral: Secondary | ICD-10-CM | POA: Diagnosis not present

## 2023-11-10 DIAGNOSIS — H40023 Open angle with borderline findings, high risk, bilateral: Secondary | ICD-10-CM | POA: Diagnosis not present

## 2023-11-10 DIAGNOSIS — E119 Type 2 diabetes mellitus without complications: Secondary | ICD-10-CM | POA: Diagnosis not present

## 2023-11-10 DIAGNOSIS — H04123 Dry eye syndrome of bilateral lacrimal glands: Secondary | ICD-10-CM | POA: Diagnosis not present

## 2023-11-10 NOTE — Telephone Encounter (Signed)
 I called patient to give her recommendation patient went to the MD office and she stated they told her to drink more water  her kidney function was low

## 2023-11-11 NOTE — Telephone Encounter (Signed)
 Copied from CRM [id number]. Topic: General - Call Back - No Documentation >> Nov 11, 2023  9:47 AM DeAngela L wrote:  Reason for CRM: patient returning call and previously spoke with Charice and she would like for Charice to give her a call back today she really wants to speak with her and would like to give her an update   Pt num [phone] (H)

## 2023-11-13 ENCOUNTER — Encounter: Payer: Self-pay | Admitting: Cardiology

## 2023-11-17 ENCOUNTER — Encounter: Payer: Self-pay | Admitting: Obstetrics and Gynecology

## 2023-11-18 DIAGNOSIS — E1140 Type 2 diabetes mellitus with diabetic neuropathy, unspecified: Secondary | ICD-10-CM | POA: Diagnosis not present

## 2023-11-18 DIAGNOSIS — Z794 Long term (current) use of insulin: Secondary | ICD-10-CM | POA: Diagnosis not present

## 2023-11-18 DIAGNOSIS — G8929 Other chronic pain: Secondary | ICD-10-CM | POA: Diagnosis not present

## 2023-11-18 DIAGNOSIS — G35 Multiple sclerosis: Secondary | ICD-10-CM | POA: Diagnosis not present

## 2023-11-18 DIAGNOSIS — M79604 Pain in right leg: Secondary | ICD-10-CM | POA: Diagnosis not present

## 2023-11-18 DIAGNOSIS — F331 Major depressive disorder, recurrent, moderate: Secondary | ICD-10-CM | POA: Diagnosis not present

## 2023-11-18 DIAGNOSIS — N1831 Chronic kidney disease, stage 3a: Secondary | ICD-10-CM | POA: Diagnosis not present

## 2023-11-18 DIAGNOSIS — Z79899 Other long term (current) drug therapy: Secondary | ICD-10-CM | POA: Diagnosis not present

## 2023-11-18 DIAGNOSIS — E663 Overweight: Secondary | ICD-10-CM | POA: Diagnosis not present

## 2023-11-18 DIAGNOSIS — M069 Rheumatoid arthritis, unspecified: Secondary | ICD-10-CM | POA: Diagnosis not present

## 2023-11-18 DIAGNOSIS — M79605 Pain in left leg: Secondary | ICD-10-CM | POA: Diagnosis not present

## 2023-11-18 DIAGNOSIS — M549 Dorsalgia, unspecified: Secondary | ICD-10-CM | POA: Diagnosis not present

## 2023-11-18 DIAGNOSIS — Z9181 History of falling: Secondary | ICD-10-CM | POA: Diagnosis not present

## 2023-11-23 ENCOUNTER — Encounter (HOSPITAL_COMMUNITY): Payer: Self-pay

## 2023-11-23 DIAGNOSIS — Z79899 Other long term (current) drug therapy: Secondary | ICD-10-CM | POA: Diagnosis not present

## 2023-11-25 ENCOUNTER — Other Ambulatory Visit: Payer: Self-pay | Admitting: Internal Medicine

## 2023-11-25 ENCOUNTER — Ambulatory Visit (HOSPITAL_COMMUNITY)
Admission: RE | Admit: 2023-11-25 | Discharge: 2023-11-25 | Disposition: A | Discharge status: Home / Self Care | Source: Ambulatory Visit | Attending: Internal Medicine | Admitting: Internal Medicine

## 2023-11-25 DIAGNOSIS — I471 Supraventricular tachycardia, unspecified: Secondary | ICD-10-CM | POA: Insufficient documentation

## 2023-11-25 DIAGNOSIS — I422 Other hypertrophic cardiomyopathy: Secondary | ICD-10-CM

## 2023-11-25 MED ORDER — GADOBUTROL 1 MMOL/ML IV SOLN
10.0000 mL | Freq: Once | INTRAVENOUS | Status: AC | PRN
  Administered 2023-11-25: 10 mL via INTRAVENOUS

## 2023-11-30 ENCOUNTER — Telehealth: Payer: Self-pay | Admitting: Dietician

## 2023-11-30 DIAGNOSIS — F330 Major depressive disorder, recurrent, mild: Secondary | ICD-10-CM | POA: Diagnosis not present

## 2023-11-30 DIAGNOSIS — F411 Generalized anxiety disorder: Secondary | ICD-10-CM | POA: Diagnosis not present

## 2023-12-04 ENCOUNTER — Telehealth: Payer: Self-pay | Admitting: Dietician

## 2023-12-04 NOTE — Telephone Encounter (Signed)
 Called patient to follow up re:  pain at POD site but she is unavailable.   Left my name and number if she is continuing to have issues and would like to call.  Leita Constable, RD, LDN, CDCES, DipACLM

## 2023-12-07 ENCOUNTER — Telehealth: Payer: Self-pay | Admitting: Internal Medicine

## 2023-12-07 ENCOUNTER — Ambulatory Visit: Payer: Self-pay

## 2023-12-07 NOTE — Telephone Encounter (Signed)
 FYI Only or Action Required?: Action required by provider: referral request and update on patient condition. Referral request to neurology Patient calling pain mgmt today to notify of pain  Patient was last seen in primary care on 11/05/2023 by Courtney Bascom RAMAN, NP.  Called Nurse Triage reporting Pain.  Symptoms began several days ago.  Interventions attempted: Prescription medications: oxycodone .  Symptoms are: unchanged.  Triage Disposition: See HCP Within 4 Hours (Or PCP Triage)  Patient/caregiver understands and will follow disposition?: Yes  Copied from CRM [id number]. Topic: Clinical - Red Word Triage >> Dec 07, 2023  9:37 AM Courtney [name] wrote: Red Word that prompted transfer to Nurse Triage: patient has all over pain, patient has a pain management provider, her pain is now feels different, patient has slept only maybe 3 hours of sleep, patient is diabetic 144 blood sugar  Patient would like a referral to neurologist for nerve pain Reason for Disposition  [1] SEVERE pain (e.g., excruciating, unable to do any normal activities) AND [2] not improved 2 hours after pain medicine  Answer Assessment - Initial Assessment Questions Followed by pain management, taking oxycodone , but is not managing her symptoms now  Patient describes pain over the weekend as more intense and different than she has ever felt before.   Patient would also like referral to neurology  1. ONSET: When did the muscle aches or body pains start?      Two days ago 2. LOCATION: What part of your body is hurting? (e.g., entire body, arms, legs)      Cannot pinpoint direct area, pain throughout body 3. SEVERITY: How bad is the pain? (Scale 1-10; or mild, moderate, severe)     10/10 4. CAUSE: What do you think is causing the pains?     Patient believes that it is nerve pain 5. FEVER: Do you have a fever? If Yes, ask: What is your temperature, how was it measured, and  when did it start?      denies 6.  OTHER SYMPTOMS: Do you have any other symptoms? (e.g., chest pain, cold or flu symptoms, rash, weakness, weight loss)     Patient states that over the years, she has been diagnosed with MS and years later, told that she did not have MS.  However, her symptoms have never subsided.  Today she describes symptoms of spasm and has also suffered from insomnia this weekend 7. PREGNANCY: Is there any chance you are pregnant? When was your last menstrual period?     N/a  Protocols used: Muscle Aches and Body Pain-A-AH

## 2023-12-07 NOTE — Telephone Encounter (Signed)
 Patient identification verified by 2 forms.   Called and spoke to patient  Patient states:  -Pt would like a sleep study now  Patient agrees with plan, no questions at this time

## 2023-12-07 NOTE — Telephone Encounter (Signed)
  Per MyChart scheduling message:  Also I request sleep study! Will you look into that for me.  Again thank you for appointment.

## 2023-12-11 DIAGNOSIS — Z1231 Encounter for screening mammogram for malignant neoplasm of breast: Secondary | ICD-10-CM | POA: Diagnosis not present

## 2023-12-11 LAB — HM MAMMOGRAPHY

## 2023-12-14 ENCOUNTER — Encounter: Payer: Self-pay | Admitting: Family

## 2023-12-14 NOTE — Telephone Encounter (Signed)
 Left a message to call back.   Itamar sleep study ordered.

## 2023-12-14 NOTE — Telephone Encounter (Signed)
 Pt returning call about sleep study. Please advise.

## 2023-12-14 NOTE — Addendum Note (Signed)
 Addended by: RANDY HAMP SAILOR on: 12/14/2023 01:31 PM   Modules accepted: Orders

## 2023-12-14 NOTE — Telephone Encounter (Signed)
 Called pt left a detailed message advising pt Sleep study has been ordered.  Also left results of Cardiac MRI advised to call back with questions or concerns.

## 2023-12-15 ENCOUNTER — Ambulatory Visit: Admitting: Internal Medicine

## 2023-12-15 ENCOUNTER — Encounter: Payer: Self-pay | Admitting: Internal Medicine

## 2023-12-15 VITALS — BP 124/76 | HR 70 | Ht 62.5 in | Wt 164.0 lb

## 2023-12-15 DIAGNOSIS — N1831 Chronic kidney disease, stage 3a: Secondary | ICD-10-CM | POA: Diagnosis not present

## 2023-12-15 DIAGNOSIS — E1142 Type 2 diabetes mellitus with diabetic polyneuropathy: Secondary | ICD-10-CM

## 2023-12-15 DIAGNOSIS — E1122 Type 2 diabetes mellitus with diabetic chronic kidney disease: Secondary | ICD-10-CM | POA: Diagnosis not present

## 2023-12-15 DIAGNOSIS — Z794 Long term (current) use of insulin: Secondary | ICD-10-CM | POA: Diagnosis not present

## 2023-12-15 NOTE — Progress Notes (Unsigned)
 Name: Courtney [name]  Age/ Sex: 68 y.o., female   MRN/ DOB: [id number], Dec 27, 1955     PCP: Lorren Greig PARAS, NP   Reason for Endocrinology Evaluation: Type 2 Diabetes Mellitus  Initial Endocrine Consultative Visit: 06/20/2020    PATIENT IDENTIFIER: Courtney [name] is a 68 y.o. female with a past medical history of T2DM, MS , HTN and essential tremors. The patient has followed with Endocrinology clinic since 06/20/20 for consultative assistance with management of her diabetes.  DIABETIC HISTORY:  Ms. Stephene Alegria was diagnosed with DM in 2008, Victoza caused nausea, Bydureon . Has been on insulin  since 2011. Her hemoglobin A1c has ranged from 7.3% in 2017, peaking at 8.0% in years ago.   On her initial visit to our clinic she had an A1c of 6.4% , she was on Jardiance  and novolog  mix . She was having recurrent hypoglycemia at the time, we reduced insulin  and continued Jardiance     Started Trulicity  08/2021 was stopped by 12/2021 due to complaints of loss of appetite and borborygmi  but opted to go back 04/2022  Ozempic  caused pruritus but this continued despite being off Ozempic  for over 6 weeks, stopped Jardiance  06/2022 to see if this would help with pruritus    She was trained on the OmniPod in June, 2025   SUBJECTIVE:   During the last visit (06/15/2023): A1c 6.9%    Today (06/19/2022): [name] is here for a follow up on diabetes management.  She checks her blood sugars multiple times daily through CGM.  The patient has been noted with hypoglycemia, she is symptomatic with these episodes.   She established with cardiology for hypertrophic cardiomyopathy, scheduled for stress test in 01/2024  Patient has been following up with nephrology Dr. Dennise She continues to follow-up with orthopedics for cervical radiculopathy,  The patient endorses shocklike waves all over her body in the arm, chest, legs.  She has a prior history of MS while living in [place], but this diagnosis was removed when she moved to [place] , she has a follow-up appointment with her PCP tomorrow to discuss this  Patient also follows with behavioral health  She has nausea that she attributes to being on the pump?, no heartburn  No constipation or diarrhea    This patient with type 2 diabetes is treated with OmniPod (insulin  pump). During the visit the pump basal and bolus doses were reviewed including carb/insulin  rations and supplemental doses. The clinical list was updated. The glucose meter download was reviewed in detail to determine if the current pump settings are providing the best glycemic control without excessive hypoglycemia.  Pump and meter download:    Pump   OmniPod Settings   Insulin  type   NovoLog    Basal rate       0000 0.35 u/h               I:C ratio       0000 1:1    Enter # 5 g     2g with snack   Sensitivity       0000  40      Goal       0000  120           PUMP STATISTICS:  N/a   HOME DIABETES REGIMEN:  Jardiance  25 mg daily  NovoLog    Statin: no ACE-I/ARB: no Prior Diabetic Education: no   CONTINUOUS GLUCOSE MONITORING RECORD  INTERPRETATION    Dates of Recording: 10/1-10/14/2025  Sensor description:dexcom  Results statistics:   CGM use % of time 95  Average and SD 145/34  Time in range 85%  % Time Above 180 14  % Time above 250 0  % Time Below target 1   Glycemic patterns summary: BGs are optimal throughout the day and night Hyperglycemic episodes  postprandial   Hypoglycemic episodes occurred N/A   Overnight periods: Optimal      DIABETIC COMPLICATIONS: Microvascular complications:  Neuropathy Denies: retinopathy, CKD Last Eye Exam: Completed 02/05/2021  Macrovascular complications:   Denies: CAD, CVA, PVD   HISTORY:  Past Medical History:  Past Medical History:  Diagnosis Date   Abnormal ECG 07/18/2015   Arthritis    Asthma    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   Bowel obstruction (HCC)    Depression with anxiety 02/09/2015   mild xanax  prn   Diabetes mellitus (HCC) 07/18/2015   type 2   Diverticulitis    Dysrhythmia    abnormal ekgs worked up   Essential tremor 03/31/2016   Fibromyalgia    MS   Gait difficulty 02/09/2015   Gallstones    GERD (gastroesophageal reflux disease)    HLD (hyperlipidemia)    Hypersomnia 11/09/2015   Hypertension    no meds   Hyperthyroidism    patient states has no problems at present-01/25/2019   Hypothyroidism    patient states no problems at present 01/25/2019   IBS (irritable bowel syndrome)    Insomnia 02/09/2015   Migraines    MS (multiple sclerosis)    Multiple joint pain 03/31/2016   Numbness in both hands 03/18/2016   Other fatigue 02/09/2015   Peptic ulcer    Pneumonia    multiple times. Last time 2014   PONV (postoperative nausea and vomiting)    Urinary hesitancy 02/09/2015   Vision abnormalities    Past Surgical History:  Past Surgical History:  Procedure Laterality Date   ABDOMINAL HYSTERECTOMY  1976   including oophorectomy, due to uterine cancer   APPENDECTOMY  1974   BIOPSY  01/31/2019   Procedure: BIOPSY;  Surgeon: Shila Gustav GAILS, MD;  Location: [facility];  Service: Endoscopy;;   CARDIAC CATHETERIZATION     2001/2006 [place]. Normal per patient   CARPAL TUNNEL RELEASE Left    carpal tunnel surgery   CHOLECYSTECTOMY     2006   COLON SURGERY     SBO x 2   COLONOSCOPY WITH PROPOFOL  N/A 01/31/2019   Procedure: COLONOSCOPY WITH PROPOFOL ;  Surgeon: Shila Gustav GAILS, MD;  Location: [facility];  Service: Endoscopy;  Laterality: N/A;   COLONOSCOPY WITH PROPOFOL  N/A 04/24/2022   Procedure: COLONOSCOPY WITH PROPOFOL ;  Surgeon: Shila Gustav GAILS, MD;  Location: [facility];  Service: Gastroenterology;  Laterality: N/A;   EUS N/A 04/09/2017   Procedure: UPPER ENDOSCOPIC ULTRASOUND (EUS) LINEAR;  Surgeon:  Teressa Toribio SQUIBB, MD;  Location: [facility];  Service: Endoscopy;  Laterality: N/A;   HERNIA REPAIR     INCISIONAL HERNIA REPAIR N/A 05/05/2022   Procedure: LAPAROSCOPIC VENTRAL HERNA REPAIR WITH MESH;  Surgeon: Dasie Leonor CROME, MD;  Location: [facility];  Service: General;  Laterality: N/A;   INSERTION OF MESH N/A 05/05/2022   Procedure: INSERTION OF MESH;  Surgeon: Dasie Leonor CROME, MD;  Location: [facility];  Service: General;  Laterality: N/A;   MENISCUS REPAIR Left  POLYPECTOMY  01/31/2019   Procedure: POLYPECTOMY;  Surgeon: Shila Gustav GAILS, MD;  Location: [facility];  Service: Endoscopy;;   POLYPECTOMY  04/24/2022   Procedure: POLYPECTOMY;  Surgeon: Shila Gustav GAILS, MD;  Location: [facility];  Service: Gastroenterology;;   SMALL INTESTINE SURGERY     toenail removal Right 12/2017   great toenail    Social History:  reports that she quit smoking about 35 years ago. Her smoking use included cigarettes. She started smoking about 40 years ago. She has a 1.3 pack-year smoking history. She has never been exposed to tobacco smoke. She has never used smokeless tobacco. She reports that she does not currently use alcohol . She reports current drug use. Drug: Oxycodone . Family History:  Family History  Problem Relation Age of Onset   Heart disease Mother    Kidney disease Mother    Diabetes Mother    Other Father 69       murdered   Multiple sclerosis Daughter    Multiple sclerosis Other    Diabetes Sister    Alcohol  abuse Brother        ETOH and marijuana   Diabetes Brother    Diabetes Sister    Diabetes Brother    ALS Brother      HOME MEDICATIONS: Allergies as of 12/15/2023       Reactions   Clarithromycin Swelling   Ibuprofen Other (See Comments)   Developed a ulcer Other Reaction(s): Not available ibuprofen Other reaction(s): Other (See Comments) Developed a ulcer     Developed a ulcer     ibuprofen    Other reaction(s): Other (See Comments) Developed a ulcer    Developed a ulcer  Other reaction(s): Other (See Comments) Developed a ulcer   Other reaction(s): Other (See Comments) Developed a ulcer    Other reaction(s): Other (See Comments), Developed a ulcer   Iodinated Contrast Media Hives   Liraglutide Nausea And Vomiting, Nausea Only, Other (See Comments)   liraglutide   Methylprednisolone  Anaphylaxis, Palpitations, Shortness Of Breath, Anxiety   Also reports tongue swelling, rash and facial redness  Pt stated she has had other steroids w/no issue.  But Can not take methylprednisolone   Pt states it affected her mood  Lack of consciousness   Naproxen Other (See Comments)   Developed a ulcer Other Reaction(s): Not available naproxen Other reaction(s): Other (See Comments) Developed a ulcer     Developed a ulcer     naproxen    Developed a ulcer   Nsaids Other (See Comments)   Pt gets Ulcers    Prednisone  Anaphylaxis, Palpitations, Shortness Of Breath, Hives   prednisone    Semaglutide  Dermatitis, Itching   Other Reaction(s): Unknown   Sulfa Antibiotics Other (See Comments), Swelling, Anaphylaxis   Angioedema Other Reaction(s): Unknown   Sulfamethoxazole-trimethoprim Other (See Comments)   blotchiness redness, face swelling Other Reaction(s): Not available sulfamethoxazole / trimethoprim   Metformin Diarrhea, Other (See Comments)   Aspirin    Other Reaction(s): Not available, Unknown aspirin   Hydromorphone  Other (See Comments)   Ozempic  (0.25 Or 0.5 Mg-dose) [semaglutide (0.25 Or 0.5mg -dos)] Rash        Medication List        Accurate as of December 15, 2023 12:42 PM. If you have any questions, ask your nurse or doctor.          acetaminophen  500 MG tablet Commonly known as: TYLENOL  Take 1,000 mg by mouth every 8 (eight) hours as needed for headache.   ALPRAZolam  1 MG  tablet Commonly known as: XANAX  Take 1 mg by mouth 3 (three) times daily as needed for anxiety.   azelastine  0.1 % nasal spray Commonly known as:  ASTELIN  Place 2 sprays into both nostrils 2 (two) times daily. Use in each nostril as directed   benzonatate  200 MG capsule Commonly known as: TESSALON  Take 1 capsule (200 mg total) by mouth 3 (three) times daily as needed for cough.   cyclobenzaprine 10 MG tablet Commonly known as: FLEXERIL Take 10 mg by mouth 3 (three) times daily as needed for muscle spasms.   Dexcom G7 Sensor Misc 1 Device by Does not apply route as directed.   fluconazole  100 MG tablet Commonly known as: DIFLUCAN  Take 1 tablet (100 mg total) by mouth daily.   FreeStyle Lite w/Device Kit Use to check blood sugars up to 4 times daily   insulin  aspart 100 UNIT/ML injection Commonly known as: novoLOG  Max daily 60 units through the pump   NovoLOG  FlexPen 100 UNIT/ML FlexPen Generic drug: insulin  aspart Inject 6 Units into the skin 3 (three) times daily with meals.   Insulin  Pen Needle 32G X 4 MM Misc 1 Device by Does not apply route in the morning, at noon, in the evening, and at bedtime.   Jardiance  25 MG Tabs tablet Generic drug: empagliflozin  TAKE 1 TABLET DAILY   Lantus  SoloStar 100 UNIT/ML Solostar Pen Generic drug: insulin  glargine Inject 14 Units into the skin daily.   latanoprost 0.005 % ophthalmic solution Commonly known as: XALATAN Place 1 drop into both eyes at bedtime.   lidocaine  5 % ointment Commonly known as: XYLOCAINE  Apply topically daily. Arm pain   loratadine 10 MG tablet Commonly known as: CLARITIN Take 10 mg by mouth daily as needed for allergies.   modafinil 100 MG tablet Commonly known as: PROVIGIL Take 25 mg by mouth daily as needed (Takes to help stay awake due to insomnia per patient).   Multi-Vitamin tablet Take by mouth daily.   mupirocin  ointment 2 % Commonly known as: BACTROBAN  Apply 1 Application topically 2 (two) times daily.   naloxone 4 MG/0.1ML Liqd nasal spray kit Commonly known as: NARCAN Place 1 spray into the nose daily as needed (overdose).    Omnipod 5 G7 Intro (Gen 5) Kit 1 Device by Does not apply route every other day.   onetouch ultrasoft lancets Check blood sugar 2 times daily   OneTouch Verio test strip Generic drug: glucose blood Check blood sugar 2 times daily   oxyCODONE  15 MG immediate release tablet Commonly known as: ROXICODONE  Take 15 mg by mouth every 6 (six) hours as needed for pain.   pantoprazole  40 MG tablet Commonly known as: PROTONIX  Take 1 tablet (40 mg total) by mouth daily. Please schedule a yearly follow up for further refills. Thank you   predniSONE  20 MG tablet Commonly known as: DELTASONE  Take 20 mg by mouth daily.   pregabalin  50 MG capsule Commonly known as: LYRICA  daily.   terconazole  0.8 % vaginal cream Commonly known as: TERAZOL 3  Place 1 applicator vaginally at bedtime.   tirzepatide  2.5 MG/0.5ML Pen Commonly known as: MOUNJARO  Inject 2.5 mg into the skin once a week.   tiZANidine 4 MG tablet Commonly known as: ZANAFLEX Take 4 mg by mouth 3 (three) times daily as needed.   Vitamin D  (Ergocalciferol ) 1.25 MG (50000 UNIT) Caps capsule Commonly known as: DRISDOL Take 50,000 Units by mouth once a week.         OBJECTIVE:  Vital Signs: BP 124/76 (BP Location: Left Arm, Patient Position: Sitting, Cuff Size: Large)   Pulse 70   Ht 5' 2.5 (1.588 m)   Wt 164 lb (74.4 kg)   SpO2 96%   BMI 29.52 kg/m    Wt Readings from Last 3 Encounters:  11/05/23 159 lb 9.6 oz (72.4 kg)  09/07/23 161 lb (73 kg)  06/15/23 161 lb (73 kg)    Exam: General: Pt appears well and is in NAD  Lungs: Clear with good BS bilat   Heart: RRR   Extremities: No pretibial edema.   Neuro: MS is good with appropriate affect, pt is alert and Ox3      DM Foot Exam 06/15/2023   The skin of the feet is intact without sores or ulcerations. The pedal pulses are 2+ on right and 2+ on left. The sensation is intact to a screening 5.07, 10 gram monofilament bilaterally    DATA  REVIEWED:  Lab Results  Component Value Date   HGBA1C 6.8 (A) 11/05/2023   HGBA1C 6.9 (A) 06/15/2023   HGBA1C 6.7 (A) 06/19/2022     Latest Reference Range & Units 11/05/23 08:51  Sodium 134 - 144 mmol/L 139  Potassium 3.5 - 5.2 mmol/L 4.6  Chloride 96 - 106 mmol/L 99  CO2 20 - 29 mmol/L 21  Glucose 70 - 99 mg/dL 834 (H)  BUN 8 - 27 mg/dL 15  Creatinine 9.42 - 8.99 mg/dL 8.78 (H)  Calcium 8.7 - 10.3 mg/dL 9.9  BUN/Creatinine Ratio 12 - 28  12  eGFR >59 mL/min/1.73 49 (L)  Alkaline Phosphatase 44 - 121 IU/L 95  Albumin 3.9 - 4.9 g/dL 5.0 (H)  AST 0 - 40 IU/L 18  ALT 0 - 32 IU/L 10  Total Protein 6.0 - 8.5 g/dL 8.0  Total Bilirubin 0.0 - 1.2 mg/dL 0.4    Latest Reference Range & Units 11/05/23 08:51  TSH 0.450 - 4.500 uIU/mL 1.320  Thyroxine (T4) 4.5 - 12.0 ug/dL 7.6  Free Thyroxine Index 1.2 - 4.9  2.1  T3 Uptake Ratio 24 - 39 % 27      Old records , labs and images have been reviewed.    ASSESSMENT / PLAN / RECOMMENDATIONS:   1) Type 2 Diabetes Mellitus, Optimally controlled, With neuropathic and CKD III complications - A1c 6.8%, Goal A1c < 7.0 %.    -A1c is optimal -She was on Trulicity  at some point, but she endorsed nonspecific symptoms to it, in hindsight she believes this had to do with hernia rather than the actual Trulicity .  She is not a good candidate for GLP-1 agonist due to her diagnosis of gastroparesis - Patient was advised to use skin- tac 2 help with sensor/pod adherence to the skin - The patient was advised to continue to enter 5 g of carbohydrates with meals and 2 g with snacks - No changes today     MEDICATIONS: - Continue Jardiance  25 mg 1 tablet daily  - NovoLog      EDUCATION / INSTRUCTIONS: BG monitoring instructions: Patient is instructed to check her blood sugars 2 times a day, breakfast and supper. Call [facility] if: BG persistently < 70  I reviewed the Rule of 15 for the treatment of hypoglycemia in detail with  the patient. Literature supplied.   2) Diabetic complications:  Eye: Does not have known diabetic retinopathy.  Neuro/ Feet: Does  have known diabetic peripheral neuropathy .  Renal: Patient does not have known baseline CKD.  She   is not on an ACEI/ARB at present.     3) Lipids: Patient is has been off statins due to severe cramps.    F/U in 6 months   I spent 27 minutes preparing to see the patient by review of recent labs, imaging and procedures, obtaining and reviewing separately obtained history, communicating with the patient, ordering medications, tests or procedures, and documenting clinical information in the EHR including the differential Dx, treatment, and any further evaluation and other management    Signed electronically by: Stefano Redgie Butts, MD  [facility] Endocrinology  [facility] [address] [place], [place] [address] Phone: [phone] FAX: [phone]   CC: Lorren Greig PARAS, NP [address] [place] [address] Phone: [phone]  Fax: [phone]  Return to Endocrinology clinic as below: Future Appointments  Date Time Provider Department Center  12/15/2023  1:20 PM Jahniah Pallas, Donell Redgie, MD [facility] None  12/16/2023  4:00 PM Lorren Greig PARAS, NP [facility] [address]  02/05/2024 11:30 AM [facility]  02/08/2024  8:20 AM Oley Bascom RAMAN, NP [facility]  02/16/2024  9:40 AM Santo Stanly LABOR, MD [facility]

## 2023-12-15 NOTE — Telephone Encounter (Signed)
 Pt called in reports heard voicemail left for her with results of testing and sleep study ordered.  Pt wants to report she is very tired but can't sleep at night.  Reports if she gets 5 hours a night that is a good night for her.    Pt reports intermittent SOB and daily skipped beats from heart but not bothersome.  Advised pt to f/u with PCP regarding fatigue and inability to sleep.

## 2023-12-15 NOTE — Telephone Encounter (Signed)
 Left a message to call back.

## 2023-12-15 NOTE — Patient Instructions (Addendum)
 Skin -Tac  liquid , apply to the skin prior to inserting the pod or Dexcom sensor  Continue to enter 5 g of carbohydrates with each meal and 2 g with snacks    HOW TO TREAT LOW BLOOD SUGARS (Blood sugar LESS THAN 70 MG/DL) Please follow the RULE OF 15 for the treatment of hypoglycemia treatment (when your (blood sugars are less than 70 mg/dL)   STEP 1: Take 15 grams of carbohydrates when your blood sugar is low, which includes:  3-4 GLUCOSE TABS  OR 3-4 OZ OF JUICE OR REGULAR SODA OR ONE TUBE OF GLUCOSE GEL    STEP 2: RECHECK blood sugar in 15 MINUTES STEP 3: If your blood sugar is still low at the 15 minute recheck --> then, go back to STEP 1 and treat AGAIN with another 15 grams of carbohydrates.

## 2023-12-15 NOTE — Telephone Encounter (Signed)
  Patient sent mychart message to scheduling stating that she was returning your message.

## 2023-12-16 ENCOUNTER — Encounter: Payer: Self-pay | Admitting: Internal Medicine

## 2023-12-16 ENCOUNTER — Ambulatory Visit: Payer: Self-pay | Admitting: Family

## 2023-12-16 ENCOUNTER — Encounter: Payer: Self-pay | Admitting: Family

## 2023-12-16 ENCOUNTER — Other Ambulatory Visit: Payer: Self-pay

## 2023-12-16 ENCOUNTER — Ambulatory Visit (INDEPENDENT_AMBULATORY_CARE_PROVIDER_SITE_OTHER): Admitting: Family

## 2023-12-16 VITALS — BP 138/86 | HR 65 | Temp 98.5°F | Resp 16 | Ht 62.5 in | Wt 161.6 lb

## 2023-12-16 DIAGNOSIS — G35D Multiple sclerosis, unspecified: Secondary | ICD-10-CM | POA: Diagnosis not present

## 2023-12-16 DIAGNOSIS — F419 Anxiety disorder, unspecified: Secondary | ICD-10-CM

## 2023-12-16 DIAGNOSIS — G4700 Insomnia, unspecified: Secondary | ICD-10-CM | POA: Diagnosis not present

## 2023-12-16 DIAGNOSIS — J3089 Other allergic rhinitis: Secondary | ICD-10-CM

## 2023-12-16 DIAGNOSIS — F32A Depression, unspecified: Secondary | ICD-10-CM | POA: Diagnosis not present

## 2023-12-16 MED ORDER — CETIRIZINE HCL 5 MG PO TABS: 5.0000 mg | ORAL_TABLET | Freq: Every day | ORAL | 0 refills | Status: AC

## 2023-12-16 MED ORDER — EMPAGLIFLOZIN 25 MG PO TABS: 25.0000 mg | ORAL_TABLET | Freq: Every day | ORAL | 1 refills | Status: AC

## 2023-12-16 NOTE — Telephone Encounter (Signed)
 Spoke with pt on 12/15/23 please see encounter for more details.

## 2023-12-16 NOTE — Progress Notes (Signed)
 Patient ID: Courtney [name], female    DOB: 11-08-1955  MRN: [id number]  CC: Chronic Conditions Follow-Up  Subjective: Courtney [name] is a 68 y.o. female who presents for chronic conditions follow-up.   Her concerns today include:  - States she was diagnosed with multiple sclerosis 12 years ago while living in [place] . States at that time she was prescribed medication for the same which she was allergic to. States when she moved to [place]  she was told by Neurology that she does not have multiple sclerosis. States jerking of body, nerve pain, numbness, tingling, and clothes hurting skin persisting. Denies red flag symptoms.  - Established with Endocrinology for diabetes and related chronic conditions management and had office visit on yesterday.  - Anxiety depression. Established with Behavioral Health. She denies thoughts of self-harm, suicidal ideations, homicidal ideations. - Insomnia. States Cardiology has ordered a sleep study for her. States Ambien and or Xanax  helps with sleep sometimes. Patient aware I am unable to prescribe the same due to [facility]'s office policy. Patient states other medications do not help with insomnia.  - Allergies persisting. Denies red flag symptoms.   Patient Active Problem List   Diagnosis Date Noted   Myalgia, other site 08/18/2023   Carpal tunnel syndrome of right wrist 01/19/2023   Closed fracture of shaft of fourth metacarpal bone of left hand 01/19/2023   SBO (small bowel obstruction) (HCC) 11/14/2022   H/O umbilical hernia repair with mesh placement 05/2022 11/14/2022   AKI (acute kidney injury) 11/14/2022   Fracture of fourth metacarpal bone of right hand 10/26/2022   Hand pain 10/26/2022   Polyp of rectum 04/24/2022   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   Open comedone 12/10/2021   Supraumbilical hernia 09/25/2021   Comedone 05/03/2021   Dermatofibroma 05/03/2021   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   Milia  05/03/2021   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   Sebaceous cyst 05/03/2021   Seborrheic dermatitis of scalp 05/03/2021   Biceps tendonitis on left 04/30/2021   CKD stage 3a, GFR 45-59 ml/min (HCC) 10/21/2020   Insulin  dependent type 2 diabetes mellitus (HCC) 10/21/2020   Type 2 diabetes mellitus with diabetic polyneuropathy, with long-term current use of insulin  (HCC) 06/20/2020   Dyslipidemia, goal LDL below 70 04/10/2020   Episode of recurrent major depressive disorder 04/10/2020   Pain in left shoulder 04/04/2020   Vaginal dryness, menopausal 07/07/2019   Tremor 05/30/2019   Memory loss 05/03/2019   Hx of adenomatous colonic polyps 03/16/2019   History of colonic polyps 03/16/2019   Fracture of distal phalanx of right ring finger 03/15/2019   Facet arthritis, degenerative, 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   Polyp of transverse colon    Special screening for malignant neoplasms, colon    Polyp of sigmoid colon    Anxiety 09/09/2018   GERD (gastroesophageal reflux disease) 09/09/2018   Cervicalgia 08/10/2018   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   Laryngopharyngeal reflux (LPR) 05/14/2018   Throat pain 05/14/2018   Loss of weight    RLQ abdominal pain 03/27/2017   Bloating 03/27/2017   Abnormal finding on imaging 03/27/2017   Dilation of biliary tract 03/27/2017   Nausea 03/27/2017   Chronic sinusitis 03/19/2017   Multiple joint pain 03/31/2016   Essential tremor 03/31/2016   Numbness in both hands 03/18/2016   Hypersomnia 11/09/2015   Diabetes mellitus (HCC) 07/18/2015   Abnormal ECG 07/18/2015   Gait difficulty 02/09/2015   Chronic left shoulder pain 02/09/2015  Generalized anxiety disorder 02/09/2015   Numbness 02/09/2015   Insomnia 02/09/2015   Urinary hesitancy 02/09/2015   Other fatigue 02/09/2015   Fibromyalgia 02/09/2015   Depression 02/09/2015   Essential hypertension 09/01/2011   Coronary artery disease involving native coronary artery of native heart  without angina pectoris 09/01/2011   Parkinson's disease (HCC) 09/01/2011   Type 2 diabetes mellitus treated with insulin  (HCC) 09/01/2011   Chronic pain 09/01/2011     Current Outpatient Medications on File Prior to Visit  Medication Sig Dispense Refill   acetaminophen  (TYLENOL ) 500 MG tablet Take 1,000 mg by mouth every 8 (eight) hours as needed for headache.     ALPRAZolam  (XANAX ) 1 MG tablet Take 1 mg by mouth 3 (three) times daily as needed for anxiety.     azelastine  (ASTELIN ) 0.1 % nasal spray Place 2 sprays into both nostrils 2 (two) times daily. Use in each nostril as directed 30 mL 12   Blood Glucose Monitoring Suppl (FREESTYLE LITE) w/Device KIT Use to check blood sugars up to 4 times daily 1 kit 0   Continuous Blood Gluc Sensor (DEXCOM G7 SENSOR) MISC 1 Device by Does not apply route as directed. 9 each 3   fluconazole  (DIFLUCAN ) 100 MG tablet Take 1 tablet (100 mg total) by mouth daily. 90 tablet 0   glucose blood (ONETOUCH VERIO) test strip Check blood sugar 2 times daily 100 each 12   insulin  aspart (NOVOLOG ) 100 UNIT/ML injection Max daily 60 units through the pump 60 mL 3   Insulin  Disposable Pump (OMNIPOD 5 G7 INTRO, GEN 5,) KIT 1 Device by Does not apply route every other day. 1 kit 0   Insulin  Pen Needle 32G X 4 MM MISC 1 Device by Does not apply route in the morning, at noon, in the evening, and at bedtime. 400 each 0   latanoprost (XALATAN) 0.005 % ophthalmic solution Place 1 drop into both eyes at bedtime.     loratadine (CLARITIN) 10 MG tablet Take 10 mg by mouth daily as needed for allergies.     modafinil (PROVIGIL) 100 MG tablet Take 25 mg by mouth daily as needed (Takes to help stay awake due to insomnia per patient).     Multiple Vitamin (MULTI-VITAMIN) tablet Take by mouth daily.     mupirocin  ointment (BACTROBAN ) 2 % Apply 1 Application topically 2 (two) times daily. 22 g 2   oxyCODONE  (ROXICODONE ) 15 MG immediate release tablet Take 15 mg by mouth every 6 (six)  hours as needed for pain.     pantoprazole  (PROTONIX ) 40 MG tablet Take 1 tablet (40 mg total) by mouth daily. Please schedule a yearly follow up for further refills. Thank you 30 tablet 1   tirzepatide  (MOUNJARO ) 2.5 MG/0.5ML Pen Inject 2.5 mg into the skin once a week. 6 mL 3   Vitamin D , Ergocalciferol , (DRISDOL) 1.25 MG (50000 UNIT) CAPS capsule Take 50,000 Units by mouth once a week.     benzonatate  (TESSALON ) 200 MG capsule Take 1 capsule (200 mg total) by mouth 3 (three) times daily as needed for cough. (Patient not taking: Reported on 12/15/2023) 30 capsule 0   cyclobenzaprine (FLEXERIL) 10 MG tablet Take 10 mg by mouth 3 (three) times daily as needed for muscle spasms. (Patient not taking: Reported on 12/15/2023)     insulin  aspart (NOVOLOG  FLEXPEN) 100 UNIT/ML FlexPen Inject 6 Units into the skin 3 (three) times daily with meals. (Patient not taking: Reported on 12/15/2023) 15 mL 0  insulin  glargine (LANTUS  SOLOSTAR) 100 UNIT/ML Solostar Pen Inject 14 Units into the skin daily. (Patient not taking: Reported on 12/15/2023) 15 mL 0   Lancets (ONETOUCH ULTRASOFT) lancets Check blood sugar 2 times daily (Patient not taking: Reported on 12/15/2023) 100 each 12   lidocaine  (XYLOCAINE ) 5 % ointment Apply topically daily. Arm pain (Patient not taking: Reported on 12/15/2023)     naloxone (NARCAN) nasal spray 4 mg/0.1 mL Place 1 spray into the nose daily as needed (overdose). (Patient not taking: Reported on 12/15/2023)     predniSONE  (DELTASONE ) 20 MG tablet Take 20 mg by mouth daily. (Patient not taking: Reported on 10/23/2023)     pregabalin  (LYRICA ) 50 MG capsule daily. (Patient not taking: Reported on 12/15/2023)     terconazole  (TERAZOL 3 ) 0.8 % vaginal cream Place 1 applicator vaginally at bedtime. (Patient not taking: Reported on 12/15/2023)     tiZANidine (ZANAFLEX) 4 MG tablet Take 4 mg by mouth 3 (three) times daily as needed. (Patient not taking: Reported on 10/23/2023)     No current  facility-administered medications on file prior to visit.    Allergies  Allergen Reactions   Clarithromycin Swelling   Ibuprofen Other (See Comments)    Developed a ulcer  Other Reaction(s): Not available  ibuprofen  Other reaction(s): Other (See Comments) Developed a ulcer     Developed a ulcer     ibuprofen    Other reaction(s): Other (See Comments) Developed a ulcer   Developed a ulcer  Other reaction(s): Other (See Comments) Developed a ulcer   Other reaction(s): Other (See Comments) Developed a ulcer    Other reaction(s): Other (See Comments), Developed a ulcer   Iodinated Contrast Media Hives   Liraglutide Nausea And Vomiting, Nausea Only and Other (See Comments)    liraglutide   Methylprednisolone  Anaphylaxis, Palpitations, Shortness Of Breath and Anxiety    Also reports tongue swelling, rash and facial redness  Pt stated she has had other steroids w/no issue.  But Can not take methylprednisolone   Pt states it affected her mood  Lack of consciousness   Naproxen Other (See Comments)    Developed a ulcer  Other Reaction(s): Not available  naproxen  Other reaction(s): Other (See Comments) Developed a ulcer     Developed a ulcer     naproxen    Developed a ulcer   Nsaids Other (See Comments)    Pt gets Ulcers    Prednisone  Anaphylaxis, Palpitations, Shortness Of Breath and Hives    prednisone    Semaglutide  Dermatitis and Itching    Other Reaction(s): Unknown   Sulfa Antibiotics Other (See Comments), Swelling and Anaphylaxis    Angioedema  Other Reaction(s): Unknown   Sulfamethoxazole-Trimethoprim Other (See Comments)    blotchiness redness, face swelling  Other Reaction(s): Not available  sulfamethoxazole / trimethoprim   Metformin Diarrhea and Other (See Comments)   Aspirin     Other Reaction(s): Not available, Unknown  aspirin   Hydromorphone  Other (See Comments)   Ozempic  (0.25 Or 0.5 Mg-Dose) [Semaglutide (0.25 Or 0.5mg -Dos)] Rash     Social History   Socioeconomic History   Marital status: Married    Spouse name: Jimmie   Number of children: 4   Years of education: Not on file   Highest education level: GED or equivalent  Occupational History   Occupation: disabled  Tobacco Use   Smoking status: Former    Current packs/day: 0.00    Average packs/day: 0.3 packs/day for 5.0 years (1.3 ttl pk-yrs)  Types: Cigarettes    Start date: 22    Quit date: 16    Years since quitting: 35.8    Passive exposure: Never   Smokeless tobacco: Never  Vaping Use   Vaping status: Never Used  Substance and Sexual Activity   Alcohol  use: Not Currently    Comment: rarely   Drug use: Yes    Types: Oxycodone    Sexual activity: Yes  Other Topics Concern   Not on file  Social History Narrative   Pt is right-handed. She lives with her husband in a 2 story house. Pt avoids caffeine. She and her husband go to the gym, lately once weekly, previously 3 times a week.    Social Drivers of Corporate investment banker Strain: Low Risk  (12/14/2023)   Overall Financial Resource Strain (CARDIA)    Difficulty of Paying Living Expenses: Not hard at all  Food Insecurity: No Food Insecurity (12/14/2023)   Hunger Vital Sign    Worried About Running Out of Food in the Last Year: Never true    Ran Out of Food in the Last Year: Never true  Transportation Needs: No Transportation Needs (12/14/2023)   PRAPARE - Administrator, Civil Service (Medical): No    Lack of Transportation (Non-Medical): No  Physical Activity: Insufficiently Active (12/14/2023)   Exercise Vital Sign    Days of Exercise per Week: 2 days    Minutes of Exercise per Session: 20 min  Stress: Stress Concern Present (11/05/2023)   [employer] of Occupational Health - Occupational Stress Questionnaire    Feeling of Stress: To some extent  Social Connections: Moderately Integrated (12/14/2023)   Social Connection and Isolation Panel    Frequency of  Communication with Friends and Family: More than three times a week    Frequency of Social Gatherings with Friends and Family: Once a week    Attends Religious Services: More than 4 times per year    Active Member of [employer] or Organizations: No    Attends Banker Meetings: Not on file    Marital Status: Married  Catering manager Violence: Not At Risk (12/16/2023)   Humiliation, Afraid, Rape, and Kick questionnaire    Fear of Current or Ex-Partner: No    Emotionally Abused: No    Physically Abused: No    Sexually Abused: No    Family History  Problem Relation Age of Onset   Heart disease Mother    Kidney disease Mother    Diabetes Mother    Other Father 12       murdered   Multiple sclerosis Daughter    Multiple sclerosis Other    Diabetes Sister    Alcohol  abuse Brother        ETOH and marijuana   Diabetes Brother    Diabetes Sister    Diabetes Brother    ALS Brother     Past Surgical History:  Procedure Laterality Date   ABDOMINAL HYSTERECTOMY  1976   including oophorectomy, due to uterine cancer   APPENDECTOMY  1974   BIOPSY  01/31/2019   Procedure: BIOPSY;  Surgeon: Shila Gustav GAILS, MD;  Location: [facility];  Service: Endoscopy;;   CARDIAC CATHETERIZ[place]     [place]. Normal per patient   CARPAL TUNNEL RELEASE Left    carpal tunnel surgery   CHOLECYSTECTOMY     2006   COLON SURGERY     [facility] x 2   COLONOSCOPY WITH PROPOFOL  N/A  01/31/2019   Procedure: COLONOSCOPY WITH PROPOFOL ;  Surgeon: Shila Gustav GAILS, MD;  Location: [facility];  Service: Endoscopy;  Laterality: N/A;   COLONOSCOPY WITH PROPOFOL  N/A 04/24/2022   Procedure: COLONOSCOPY WITH PROPOFOL ;  Surgeon: Shila Gustav GAILS, MD;  Location: [facility];  Service: Gastroenterology;  Laterality: N/A;   EUS N/A 04/09/2017   Procedure: UPPER ENDOSCOPIC ULTRASOUND (EUS) LINEAR;  Surgeon: Teressa Toribio SQUIBB, MD;  Location: [facility];  Service: Endoscopy;  Laterality: N/A;    HERNIA REPAIR     INCISIONAL HERNIA REPAIR N/A 05/05/2022   Procedure: LAPAROSCOPIC VENTRAL HERNA REPAIR WITH MESH;  Surgeon: Dasie Leonor CROME, MD;  Location: [facility] OR;  Service: General;  Laterality: N/A;   INSERTION OF MESH N/A 05/05/2022   Procedure: INSERTION OF MESH;  Surgeon: Dasie Leonor CROME, MD;  Location: [facility] OR;  Service: General;  Laterality: N/A;   MENISCUS REPAIR Left    POLYPECTOMY  01/31/2019   Procedure: POLYPECTOMY;  Surgeon: Shila Gustav GAILS, MD;  Location: [facility];  Service: Endoscopy;;   POLYPECTOMY  04/24/2022   Procedure: POLYPECTOMY;  Surgeon: Shila Gustav GAILS, MD;  Location: [facility];  Service: Gastroenterology;;   SMALL INTESTINE SURGERY     toenail removal Right 12/2017   great toenail     ROS: Review of Systems Negative except as stated above  PHYSICAL EXAM: BP 138/86   Pulse 65   Temp 98.5 F (36.9 C) (Oral)   Resp 16   Ht 5' 2.5 (1.588 m)   Wt 161 lb 9.6 oz (73.3 kg)   SpO2 94%   BMI 29.09 kg/m   Physical Exam HENT:     Head: Normocephalic and atraumatic.     Nose: Nose normal.     Mouth/Throat:     Mouth: Mucous membranes are moist.     Pharynx: Oropharynx is clear.  Eyes:     Extraocular Movements: Extraocular movements intact.     Conjunctiva/sclera: Conjunctivae normal.     Pupils: Pupils are equal, round, and reactive to light.  Cardiovascular:     Rate and Rhythm: Normal rate and regular rhythm.     Pulses: Normal pulses.     Heart sounds: Normal heart sounds.  Pulmonary:     Effort: Pulmonary effort is normal.     Breath sounds: Normal breath sounds.  Musculoskeletal:        General: Normal range of motion.     Cervical back: Normal range of motion and neck supple.  Neurological:     General: No focal deficit present.     Mental Status: She is alert and oriented to person, place, and time.  Psychiatric:        Mood and Affect: Mood normal.        Behavior: Behavior normal.     ASSESSMENT AND PLAN: 1. History of  multiple sclerosis (Primary) - Referral to Neurology for evaluation/management.  - Ambulatory referral to Neurology  2. Anxiety and depression - Patient denies thoughts of self-harm, suicidal ideations, homicidal ideations. - Continue present management.  - Keep all scheduled appointments with established Behavioral Health.   3. Insomnia, unspecified type - Continue present management as managed by specialist.  - Follow-up with primary provider as scheduled.   4. Perennial allergic rhinitis - Cetirizine  as prescribed. Counseled on medication adherence/adverse effects.  - Follow-up with primary provider as scheduled.  - cetirizine  (ZYRTEC ) 5 MG tablet; Take 1 tablet (5 mg total) by mouth daily.  Dispense: 90 tablet; Refill: 0  Patient was given the opportunity to ask questions.  Patient verbalized understanding of the plan and was able to repeat key elements of the plan. Patient was given clear instructions to go to Emergency Department or return to medical center if symptoms don't improve, worsen, or new problems develop.The patient verbalized understanding.   Orders Placed This Encounter  Procedures   Ambulatory referral to Neurology     Requested Prescriptions   Signed Prescriptions Disp Refills   cetirizine  (ZYRTEC ) 5 MG tablet 90 tablet 0    Sig: Take 1 tablet (5 mg total) by mouth daily.    Return in about 3 months (around 03/17/2024) for Follow-Up or next available chronic conditions.  Greig JINNY Drones, NP

## 2023-12-16 NOTE — Progress Notes (Signed)
 Patient wants to talk about her nerves, sleep and being tired, referral to neurology, patient scored a 19 on the GAD-7

## 2023-12-18 DIAGNOSIS — G8929 Other chronic pain: Secondary | ICD-10-CM | POA: Diagnosis not present

## 2023-12-18 DIAGNOSIS — E663 Overweight: Secondary | ICD-10-CM | POA: Diagnosis not present

## 2023-12-18 DIAGNOSIS — E1140 Type 2 diabetes mellitus with diabetic neuropathy, unspecified: Secondary | ICD-10-CM | POA: Diagnosis not present

## 2023-12-18 DIAGNOSIS — F331 Major depressive disorder, recurrent, moderate: Secondary | ICD-10-CM | POA: Diagnosis not present

## 2023-12-18 DIAGNOSIS — M79605 Pain in left leg: Secondary | ICD-10-CM | POA: Diagnosis not present

## 2023-12-18 DIAGNOSIS — M069 Rheumatoid arthritis, unspecified: Secondary | ICD-10-CM | POA: Diagnosis not present

## 2023-12-18 DIAGNOSIS — Z794 Long term (current) use of insulin: Secondary | ICD-10-CM | POA: Diagnosis not present

## 2023-12-18 DIAGNOSIS — N1831 Chronic kidney disease, stage 3a: Secondary | ICD-10-CM | POA: Diagnosis not present

## 2023-12-18 DIAGNOSIS — M549 Dorsalgia, unspecified: Secondary | ICD-10-CM | POA: Diagnosis not present

## 2023-12-18 DIAGNOSIS — M79604 Pain in right leg: Secondary | ICD-10-CM | POA: Diagnosis not present

## 2023-12-18 DIAGNOSIS — Z79899 Other long term (current) drug therapy: Secondary | ICD-10-CM | POA: Diagnosis not present

## 2023-12-18 DIAGNOSIS — E559 Vitamin D deficiency, unspecified: Secondary | ICD-10-CM | POA: Diagnosis not present

## 2023-12-18 DIAGNOSIS — G35D Multiple sclerosis, unspecified: Secondary | ICD-10-CM | POA: Diagnosis not present

## 2023-12-18 DIAGNOSIS — Z9181 History of falling: Secondary | ICD-10-CM | POA: Diagnosis not present

## 2023-12-18 NOTE — Telephone Encounter (Signed)
 Call patient with update. During office visit on 12/16/2023 patient stated Ambien and or Xanax  helps with insomnia and that in the past she has tried other medications to help with insomnia without success. Patient is aware I am unable to prescribe Ambien and Xanax  per [facility] office policy. Please confirm if patient will like to try Trazodone  for insomnia as alternate to Ambien and Xanax . Thank you.

## 2023-12-21 DIAGNOSIS — Z79899 Other long term (current) drug therapy: Secondary | ICD-10-CM | POA: Diagnosis not present

## 2023-12-23 ENCOUNTER — Telehealth: Payer: Self-pay | Admitting: Family

## 2023-12-23 DIAGNOSIS — F331 Major depressive disorder, recurrent, moderate: Secondary | ICD-10-CM | POA: Diagnosis not present

## 2023-12-23 DIAGNOSIS — J4520 Mild intermittent asthma, uncomplicated: Secondary | ICD-10-CM | POA: Diagnosis not present

## 2023-12-23 DIAGNOSIS — M3500 Sicca syndrome, unspecified: Secondary | ICD-10-CM | POA: Diagnosis not present

## 2023-12-23 DIAGNOSIS — F411 Generalized anxiety disorder: Secondary | ICD-10-CM | POA: Diagnosis not present

## 2023-12-23 DIAGNOSIS — R0602 Shortness of breath: Secondary | ICD-10-CM | POA: Diagnosis not present

## 2023-12-23 DIAGNOSIS — J309 Allergic rhinitis, unspecified: Secondary | ICD-10-CM | POA: Diagnosis not present

## 2023-12-23 NOTE — Telephone Encounter (Signed)
 Patient called stating she would like to try the Trazodone  to help her sleep. Please Advise.

## 2023-12-25 ENCOUNTER — Other Ambulatory Visit: Payer: Self-pay | Admitting: Family

## 2023-12-25 DIAGNOSIS — G4700 Insomnia, unspecified: Secondary | ICD-10-CM

## 2023-12-25 MED ORDER — TRAZODONE HCL 50 MG PO TABS: 25.0000 mg | ORAL_TABLET | Freq: Every evening | ORAL | 0 refills | Status: DC | PRN

## 2023-12-25 NOTE — Telephone Encounter (Signed)
 Complete

## 2023-12-28 DIAGNOSIS — M25569 Pain in unspecified knee: Secondary | ICD-10-CM | POA: Diagnosis not present

## 2023-12-28 DIAGNOSIS — M1990 Unspecified osteoarthritis, unspecified site: Secondary | ICD-10-CM | POA: Diagnosis not present

## 2023-12-28 DIAGNOSIS — M25559 Pain in unspecified hip: Secondary | ICD-10-CM | POA: Diagnosis not present

## 2023-12-28 DIAGNOSIS — M542 Cervicalgia: Secondary | ICD-10-CM | POA: Diagnosis not present

## 2023-12-28 DIAGNOSIS — M2550 Pain in unspecified joint: Secondary | ICD-10-CM | POA: Diagnosis not present

## 2023-12-28 DIAGNOSIS — R682 Dry mouth, unspecified: Secondary | ICD-10-CM | POA: Diagnosis not present

## 2023-12-28 DIAGNOSIS — M3500 Sicca syndrome, unspecified: Secondary | ICD-10-CM | POA: Diagnosis not present

## 2023-12-28 DIAGNOSIS — H04129 Dry eye syndrome of unspecified lacrimal gland: Secondary | ICD-10-CM | POA: Diagnosis not present

## 2023-12-28 DIAGNOSIS — Z79899 Other long term (current) drug therapy: Secondary | ICD-10-CM | POA: Diagnosis not present

## 2023-12-28 DIAGNOSIS — M549 Dorsalgia, unspecified: Secondary | ICD-10-CM | POA: Diagnosis not present

## 2023-12-28 DIAGNOSIS — N1831 Chronic kidney disease, stage 3a: Secondary | ICD-10-CM | POA: Diagnosis not present

## 2023-12-28 DIAGNOSIS — M79643 Pain in unspecified hand: Secondary | ICD-10-CM | POA: Diagnosis not present

## 2024-01-04 ENCOUNTER — Encounter: Payer: Self-pay | Admitting: Radiology

## 2024-01-11 ENCOUNTER — Encounter (HOSPITAL_BASED_OUTPATIENT_CLINIC_OR_DEPARTMENT_OTHER): Payer: Self-pay | Admitting: Cardiology

## 2024-01-11 DIAGNOSIS — G478 Other sleep disorders: Secondary | ICD-10-CM | POA: Diagnosis not present

## 2024-01-11 DIAGNOSIS — F411 Generalized anxiety disorder: Secondary | ICD-10-CM | POA: Diagnosis not present

## 2024-01-11 DIAGNOSIS — F329 Major depressive disorder, single episode, unspecified: Secondary | ICD-10-CM | POA: Diagnosis not present

## 2024-01-11 DIAGNOSIS — R5382 Chronic fatigue, unspecified: Secondary | ICD-10-CM | POA: Diagnosis not present

## 2024-01-11 DIAGNOSIS — G4730 Sleep apnea, unspecified: Secondary | ICD-10-CM | POA: Diagnosis not present

## 2024-01-11 DIAGNOSIS — G4733 Obstructive sleep apnea (adult) (pediatric): Secondary | ICD-10-CM

## 2024-01-11 DIAGNOSIS — R030 Elevated blood-pressure reading, without diagnosis of hypertension: Secondary | ICD-10-CM | POA: Diagnosis not present

## 2024-01-11 DIAGNOSIS — F330 Major depressive disorder, recurrent, mild: Secondary | ICD-10-CM | POA: Diagnosis not present

## 2024-01-12 ENCOUNTER — Ambulatory Visit: Attending: Internal Medicine

## 2024-01-12 DIAGNOSIS — I422 Other hypertrophic cardiomyopathy: Secondary | ICD-10-CM

## 2024-01-12 DIAGNOSIS — I2510 Atherosclerotic heart disease of native coronary artery without angina pectoris: Secondary | ICD-10-CM

## 2024-01-12 DIAGNOSIS — I471 Supraventricular tachycardia, unspecified: Secondary | ICD-10-CM

## 2024-01-12 NOTE — Procedures (Signed)
   SLEEP STUDY REPORT Patient Information Study Date: 01/11/2024 Patient Name: Courtney [name] Patient ID: [id number] Birth Date: 1955-05-23 Age: 68 Gender: Female BMI: 19.9 (W=108 lb, H=5' 2'') Referring Physician: Stanly Leavens, MD  TEST DESCRIPTION: Home sleep apnea testing was completed using the WatchPat, a Type 1 device, utilizing peripheral arterial tonometry (PAT), chest movement, actigraphy, pulse oximetry, pulse rate, body position and snore. AHI was calculated with apnea and hypopnea using valid sleep time as the denominator. RDI includes apneas, hypopneas, and RERAs. The data acquired and the scoring of sleep and all associated events were performed in accordance with the recommended standards and specifications as outlined in the AASM Manual for the Scoring of Sleep and Associated Events 2.2.0 (2015).  FINDINGS:  1. Very Mild Obstructive Sleep Apnea with AHI 5.4/hr occurring only during supine sleep.  2. No Central Sleep Apnea with pAHIc 0.6/hr.  3. Oxygen desaturations as low as 86%.  4. Mild snoring was present. O2 sats were < 88% for 1.2 min.  5. Total sleep time was 6 hrs and 42 min.  6. 23.7% of total sleep time was spent in REM sleep.  7. Normal sleep onset latency at 21 min.  8. Shortened REM sleep onset latency at 46 min.  9. Total awakenings were 3. 10. Arrhythmia detection: None  DIAGNOSIS: Very Mild Obstructive Sleep Apnea (G47.33)  RECOMMENDATIONS: 1. Clinical correlation of these findings is necessary. The decision to treat obstructive sleep apnea (OSA) is usually based on the presence of apnea symptoms or the presence of associated medical conditions such as Hypertension, Congestive Heart Failure, Atrial Fibrillation or Obesity. The most common symptoms of OSA are snoring, gasping for breath while sleeping, daytime sleepiness and fatigue. 2. Initiating apnea therapy is recommended given the presence of symptoms and/or associated  conditions. Recommend proceeding with one of the following:  a. Auto-CPAP therapy with a pressure range of 5-20cm H2O.  b. An oral appliance (OA) that can be obtained from certain dentists with expertise in sleep medicine. These are primarily of use in non-obese patients with mild and moderate disease.  c. An ENT consultation which may be useful to look for specific causes of obstruction and possible treatment options.  d. If patient is intolerant to PAP therapy, consider referral to ENT for evaluation for hypoglossal nerve stimulator. 3. Close follow-up is necessary to ensure success with CPAP or oral appliance therapy for maximum benefit . 4. A follow-up oximetry study on CPAP is recommended to assess the adequacy of therapy and determine the need for supplemental oxygen or the potential need for Bi-level therapy. An arterial blood gas to determine the adequacy of baseline ventilation and oxygenation should also be considered. 5. Healthy sleep recommendations include: adequate nightly sleep (normal 7-9 hrs/night), avoidance of caffeine after noon and alcohol  near bedtime, and maintaining a sleep environment that is cool, dark and quiet. 6. Weight loss for overweight patients is recommended. Even modest amounts of weight loss can significantly improve the severity of sleep apnea. 7. Snoring recommendations include: weight loss where appropriate, side sleeping, and avoidance of alcohol  before bed. 8. Operation of motor vehicle should be avoided when sleepy.   Signature: Wilbert Bihari, MD; [facility]; Diplomat, American Board of Sleep Medicine Electronically Signed: 01/12/2024 8:26:25 PM

## 2024-01-13 ENCOUNTER — Ambulatory Visit: Payer: Self-pay | Admitting: Family

## 2024-01-15 DIAGNOSIS — G8929 Other chronic pain: Secondary | ICD-10-CM | POA: Diagnosis not present

## 2024-01-15 DIAGNOSIS — G35D Multiple sclerosis, unspecified: Secondary | ICD-10-CM | POA: Diagnosis not present

## 2024-01-15 DIAGNOSIS — M79604 Pain in right leg: Secondary | ICD-10-CM | POA: Diagnosis not present

## 2024-01-15 DIAGNOSIS — E663 Overweight: Secondary | ICD-10-CM | POA: Diagnosis not present

## 2024-01-15 DIAGNOSIS — Z794 Long term (current) use of insulin: Secondary | ICD-10-CM | POA: Diagnosis not present

## 2024-01-15 DIAGNOSIS — M549 Dorsalgia, unspecified: Secondary | ICD-10-CM | POA: Diagnosis not present

## 2024-01-15 DIAGNOSIS — E1140 Type 2 diabetes mellitus with diabetic neuropathy, unspecified: Secondary | ICD-10-CM | POA: Diagnosis not present

## 2024-01-15 DIAGNOSIS — F331 Major depressive disorder, recurrent, moderate: Secondary | ICD-10-CM | POA: Diagnosis not present

## 2024-01-15 DIAGNOSIS — Z79899 Other long term (current) drug therapy: Secondary | ICD-10-CM | POA: Diagnosis not present

## 2024-01-15 DIAGNOSIS — N1831 Chronic kidney disease, stage 3a: Secondary | ICD-10-CM | POA: Diagnosis not present

## 2024-01-15 DIAGNOSIS — Z9181 History of falling: Secondary | ICD-10-CM | POA: Diagnosis not present

## 2024-01-15 DIAGNOSIS — M79605 Pain in left leg: Secondary | ICD-10-CM | POA: Diagnosis not present

## 2024-01-18 DIAGNOSIS — Z79899 Other long term (current) drug therapy: Secondary | ICD-10-CM | POA: Diagnosis not present

## 2024-01-19 ENCOUNTER — Encounter: Payer: Self-pay | Admitting: Family

## 2024-01-19 ENCOUNTER — Telehealth: Payer: Self-pay | Admitting: *Deleted

## 2024-01-19 NOTE — Telephone Encounter (Signed)
-----   Message from Courtney [name] sent at 01/12/2024  8:28 PM EST ----- Very mild OSA only occurring during supine sleep.  No treatment recommended at this time.  REcommend avoiding sleeping in the supine position

## 2024-01-27 ENCOUNTER — Encounter: Payer: Self-pay | Admitting: Family

## 2024-01-27 ENCOUNTER — Telehealth (HOSPITAL_COMMUNITY): Payer: Self-pay | Admitting: *Deleted

## 2024-01-27 NOTE — Telephone Encounter (Signed)
 Reminder call given for upcoming GXT on 02/05/24 at 11:30

## 2024-02-01 ENCOUNTER — Telehealth: Payer: Self-pay

## 2024-02-01 ENCOUNTER — Other Ambulatory Visit: Payer: Self-pay | Admitting: Family

## 2024-02-01 DIAGNOSIS — B9689 Other specified bacterial agents as the cause of diseases classified elsewhere: Secondary | ICD-10-CM

## 2024-02-01 DIAGNOSIS — B3731 Acute candidiasis of vulva and vagina: Secondary | ICD-10-CM

## 2024-02-01 DIAGNOSIS — G35D Multiple sclerosis, unspecified: Secondary | ICD-10-CM

## 2024-02-01 MED ORDER — METRONIDAZOLE 500 MG PO TABS: 500.0000 mg | ORAL_TABLET | Freq: Two times a day (BID) | ORAL | 0 refills | Status: AC

## 2024-02-01 MED ORDER — TERCONAZOLE 0.8 % VA CREA: 1.0000 | TOPICAL_CREAM | Freq: Every day | VAGINAL | 1 refills | Status: AC

## 2024-02-01 NOTE — Telephone Encounter (Signed)
 Complete

## 2024-02-02 NOTE — Telephone Encounter (Signed)
 error

## 2024-02-03 ENCOUNTER — Other Ambulatory Visit: Payer: Self-pay | Admitting: Nurse Practitioner

## 2024-02-05 ENCOUNTER — Ambulatory Visit (HOSPITAL_COMMUNITY)
Admission: RE | Admit: 2024-02-05 | Discharge: 2024-02-05 | Disposition: A | Source: Ambulatory Visit | Attending: Internal Medicine

## 2024-02-05 ENCOUNTER — Other Ambulatory Visit: Payer: Self-pay | Admitting: Nurse Practitioner

## 2024-02-05 DIAGNOSIS — I471 Supraventricular tachycardia, unspecified: Secondary | ICD-10-CM

## 2024-02-05 DIAGNOSIS — I422 Other hypertrophic cardiomyopathy: Secondary | ICD-10-CM

## 2024-02-05 LAB — EXERCISE TOLERANCE TEST
Angina Index: 0
Base ST Depression (mm): 1 mm
Duke Treadmill Score: 2
Estimated workload: 8.5
Exercise duration (min): 7 min
Exercise duration (sec): 0 s
MPHR: 151 {beats}/min
Peak HR: 150 {beats}/min
Percent HR: 99 %
RPE: 18
Rest HR: 87 {beats}/min
ST Depression (mm): 1 mm

## 2024-02-05 MED ORDER — NITROFURANTOIN MONOHYD MACRO 100 MG PO CAPS: 100.0000 mg | ORAL_CAPSULE | Freq: Two times a day (BID) | ORAL | 0 refills | Status: AC

## 2024-02-05 NOTE — Telephone Encounter (Signed)
-   Metronidazole  prescribed (02/01/2024 10:16 AM EST) and Terconazole  prescribed (02/01/2024 10:15 AM EST) and sent to patient's preferred pharmacy.  - Please provide patient with Neurology referral contact information.

## 2024-02-08 ENCOUNTER — Ambulatory Visit: Payer: Self-pay | Admitting: Nurse Practitioner

## 2024-02-15 ENCOUNTER — Encounter (HOSPITAL_COMMUNITY): Payer: Self-pay

## 2024-02-15 ENCOUNTER — Encounter (HOSPITAL_COMMUNITY): Payer: Self-pay | Admitting: Nurse Practitioner

## 2024-02-16 ENCOUNTER — Ambulatory Visit: Attending: Internal Medicine | Admitting: Internal Medicine

## 2024-02-16 VITALS — BP 150/93 | HR 88 | Ht 62.5 in | Wt 163.0 lb

## 2024-02-16 DIAGNOSIS — I422 Other hypertrophic cardiomyopathy: Secondary | ICD-10-CM | POA: Diagnosis present

## 2024-02-16 DIAGNOSIS — M3500 Sicca syndrome, unspecified: Secondary | ICD-10-CM | POA: Diagnosis not present

## 2024-02-16 DIAGNOSIS — I10 Essential (primary) hypertension: Secondary | ICD-10-CM | POA: Insufficient documentation

## 2024-02-16 DIAGNOSIS — R760 Raised antibody titer: Secondary | ICD-10-CM | POA: Diagnosis not present

## 2024-02-16 LAB — LAB REPORT - SCANNED
EGFR: 68
HM Hepatitis Screen: NEGATIVE

## 2024-02-16 MED ORDER — LOSARTAN POTASSIUM 25 MG PO TABS: 12.5000 mg | ORAL_TABLET | Freq: Every day | ORAL | 3 refills | Status: DC

## 2024-02-16 NOTE — Progress Notes (Signed)
 Cardiology Office Note:  .    Date:  02/16/2024  ID:  Courtney [name], DOB 01-10-1956, MRN [id number] PCP: Jaycee Greig PARAS, NP   HeartCare Providers Cardiologist:  Stanly DELENA Leavens, MD     CC: SCD follow up  History of Present Illness: .    Courtney [name] is a 69 y.o. female with apical hypertrophic cardiomyopathy who presents for evaluation of her condition and elevated blood pressure.  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 She has undergone a heart monitor and exercise treadmill test. 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  She experiences elevated blood pressure readings both in the clinic and at home. She has a history of hypotension and was previously on blood pressure medication, which was discontinued due to low readings. She describes a sensation of her eyes feeling affected when her blood pressure is high. She is currently not on any blood pressure medication.  Her family history is significant for heart-related issues. Her brother passed away, possibly from a heart condition, and her sister has apical hypertrophic cardiomyopathy. Another sister is suspected to have the same condition. Her daughter has been screened and is negative for the condition.  She experiences body jerks, particularly concerning when they occur in her chest, causing significant anxiety. These jerks are not limited to her chest and occur in other parts of her body as well. She has discussed this with her primary care doctor but has not received a definitive explanation.  Discussed the use of AI scribe software for clinical note transcription with the patient, who gave verbal consent to proceed.  Relevant histories: .  Social  - former [facility] patient - Partner Status: Married - Living Situation: Lives with husband - Brother: Heart issues, Diabetes, deceased - Second Brother: Heart issues-  deceased - Daughter: Lupus, Diabetes, deceased due to Cardiac arrest - Sister: Heart issues - Mother: High blood pressure, deceased due to Heart issues - Aunt: deceased due to Heart related illnesses - Cousin: deceased due to Aneurysm - Cousin: deceased at age 70 due to Heart attack - 2025: The patient has experienced significant recent losses, including her brother, daughter, and niece.   ROS: As per HPI.   Studies Reviewed: .     Cardiac Studies & Procedures   ______________________________________________________________________________________________   STRESS TESTS  EXERCISE TOLERANCE TEST (ETT) 02/05/2024  Interpretation Summary   A Bruce protocol stress test was performed. Exercise capacity was normal. Patient exercised for 7 min and 0 sec. Maximum HR of 150 bpm. MPHR 99.0%. Peak METS 8.5. The patient experienced no angina during the test. The test was stopped because the patient experienced fatigue. The patient reported dyspnea during the stress test. Hypertensive blood pressure response noted during stress.   1.0 mm of ST depression in the inferolateral leads (II, III, aVF, V5 and V6) was noted - exaggerated baseline abnormalities. Exaggerated baseline TWI in inferolateral leads. There were no arrhythmias during stress. There were no arrhythmias during recovery. ECG was interpretable. The ECG was not diagnostic due to resting ST-T abnormalities.   ECG rhythm shows normal sinus rhythm. 1.0 mm of ST depression in the inferolateral leads (II, III, aVF, V5 and V6) was noted. Short PR interval; inferolateral TWI. There is left ventricular hypertrophy with strain.   ECHOCARDIOGRAM  ECHOCARDIOGRAM COMPLETE 06/03/2023  Narrative ECHOCARDIOGRAM REPORT    Patient Name:   Courtney [name] Date of  Exam: 06/03/2023 Medical Rec #:  [id number]            Height:       62.5 in Accession #:    [id number]           Weight:       163.8 lb Date of Birth:  1956-01-29            BSA:           1.767 m Patient Age:    67 years             BP:           128/68 mmHg Patient Gender: F                    HR:           82 bpm. Exam Location:  [address]  Procedure: 2D Echo, Cardiac Doppler, Color Doppler and Intracardiac Opacification Agent (Both Spectral and Color Flow Doppler were utilized during procedure).  Indications:    I10 Hypertension  History:        Patient has prior history of Echocardiogram examinations, most recent 08/24/2012. Abnormal ECG; Risk Factors:Hypertension and Diabetes.  Sonographer:    Carl Coma RDCS Referring Phys: [id number] JACKEE VEAR RADDLE DICK  IMPRESSIONS   1. There is severe LV apical hypertrophy with evidence of a spade ventricle consistent apical hypertrophic cardiomyopathy. Left ventricular ejection fraction, by estimation, is 70 to 75%. The left ventricle has hyperdynamic function. The left ventricle has no regional wall motion abnormalities. There is severe left ventricular hypertrophy of the apical segment. Left ventricular diastolic parameters are consistent with Grade I diastolic dysfunction (impaired relaxation). 2. Right ventricular systolic function is normal. The right ventricular size is normal. 3. Left atrial size was mild to moderately dilated. 4. The mitral valve is normal in structure. Mild mitral valve regurgitation. No evidence of mitral stenosis. 5. The aortic valve is normal in structure. Aortic valve regurgitation is not visualized. No aortic stenosis is present. 6. The inferior vena cava is normal in size with greater than 50% respiratory variability, suggesting right atrial pressure of 3 mmHg.  Conclusion(s)/Recommendation(s): There is severe LV apical hypertrophy with evidence of a spade ventricle consistent apical hypertrophic cardiomyopathy.  FINDINGS Left Ventricle: There is severe LV apical hypertrophy with evidence of a spade ventricle consistent apical hypertrophic cardiomyopathy. Left ventricular  ejection fraction, by estimation, is 70 to 75%. The left ventricle has hyperdynamic function. The left ventricle has no regional wall motion abnormalities. Definity  contrast agent was given IV to delineate the left ventricular endocardial borders. The left ventricular internal cavity size was normal in size. There is severe left ventricular hypertrophy of the apical segment. Left ventricular diastolic parameters are consistent with Grade I diastolic dysfunction (impaired relaxation).  Right Ventricle: The right ventricular size is normal. No increase in right ventricular wall thickness. Right ventricular systolic function is normal.  Left Atrium: Left atrial size was mild to moderately dilated.  Right Atrium: Right atrial size was normal in size.  Pericardium: There is no evidence of pericardial effusion.  Mitral Valve: The mitral valve is normal in structure. Mild mitral valve regurgitation. No evidence of mitral valve stenosis.  Tricuspid Valve: The tricuspid valve is normal in structure. Tricuspid valve regurgitation is not demonstrated. No evidence of tricuspid stenosis.  Aortic Valve: The aortic valve is normal in structure. Aortic valve regurgitation is not visualized. No aortic stenosis is present.  Pulmonic Valve: The pulmonic  valve was normal in structure. Pulmonic valve regurgitation is mild. No evidence of pulmonic stenosis.  Aorta: The aortic root is normal in size and structure.  Venous: The inferior vena cava is normal in size with greater than 50% respiratory variability, suggesting right atrial pressure of 3 mmHg.  IAS/Shunts: There is right bowing of the interatrial septum, suggestive of elevated left atrial pressure. No atrial level shunt detected by color flow Doppler.   LEFT VENTRICLE PLAX 2D LVIDd:         4.90 cm   Diastology LVIDs:         2.30 cm   LV e' medial:    6.20 cm/s LV PW:         0.60 cm   LV E/e' medial:  15.5 LV IVS:        0.60 cm   LV e' lateral:    10.20 cm/s LVOT diam:     1.50 cm   LV E/e' lateral: 9.4 LV SV:         50 LV SV Index:   28 LVOT Area:     1.77 cm   RIGHT VENTRICLE             IVC RV Basal diam:  3.30 cm     IVC diam: 1.50 cm RV S prime:     13.77 cm/s TAPSE (M-mode): 2.0 cm  LEFT ATRIUM             Index        RIGHT ATRIUM           Index LA diam:        4.50 cm 2.55 cm/m   RA Area:     10.50 cm LA Vol (A2C):   36.0 ml 20.38 ml/m  RA Volume:   23.80 ml  13.47 ml/m LA Vol (A4C):   39.1 ml 22.13 ml/m LA Biplane Vol: 37.7 ml 21.34 ml/m AORTIC VALVE LVOT Vmax:   170.67 cm/s LVOT Vmean:  103.333 cm/s LVOT VTI:    0.284 m  AORTA Ao Root diam: 2.90 cm Ao Asc diam:  2.60 cm  MITRAL VALVE MV Area (PHT): 4.49 cm    SHUNTS MV Decel Time: 169 msec    Systemic VTI:  0.28 m MV E velocity: 96.35 cm/s  Systemic Diam: 1.50 cm MV A velocity: 62.30 cm/s MV E/A ratio:  1.55  Toribio Fuel MD Electronically signed by Toribio Fuel MD Signature Date/Time: 06/03/2023/12:27:21 PM    Final    MONITORS  LONG TERM MONITOR (3-14 DAYS) 06/29/2023  Narrative   Patient had a minimum heart rate of 44 bpm, maximum heart rate of 185 bpm, and average heart rate of 68 bpm. Predominant underlying rhythm was sinus rhythm. 6 beats of relatively WCT rate 128 with abnormal axis.  Potential slow NSVT. Two short runs of SVT. Isolated PACs were rare (<1.0%). Isolated PVCs were rare (<1.0%). Triggered and diary events associated with sinus rhythm.  No malignant arrhythmias.     CARDIAC MRI  MR CARDIAC MORPHOLOGY W WO CONTRAST 11/25/2023  Narrative CLINICAL DATA:  Clinical question of hypertrophic cardiomyopathy  Study assumes BSA of 1.79 m2.  EXAM: CARDIAC MRI  TECHNIQUE: The patient was scanned on a 1.5 Tesla GE magnet. A dedicated cardiac coil was used. Functional imaging was done using Fiesta sequences. 2,3, and 4 chamber views were done to assess for RWMA's. Modified Simpson's rule using was used to  calculate an ejection fraction on a dedicated work station  using [employer]. The patient received 10 cc of Gadavist . After 10 minutes inversion recovery sequences were used to assess for infiltration and scar tissue. Flow quantification was performed 2 times during this examination with flow quantification performed at the levels of the ascending aorta above the valve, pulmonary artery above the valve.  CONTRAST:  10 cc  of Gadavist   FINDINGS: 1. Normal left ventricular size, with LVEDD 52.3 mm, and LVEDVi 44 mL/m2.  Apical hypertrophy with maximal thickness 16 mm and myocardial mass index of 57 g/m2.  Normal left ventricular systolic function (LVEF =58%). There are no regional wall motion abnormalities. Small aneurysm has formed, 50 cubic mm. No Thrombus.  Left ventricular parametric mapping notable for normal T1 (no low T1 on assessment) and T2 signal. Poor post contrast T1 acquisition, unable to give ECV.  There is no late gadolinium enhancement in the left ventricular myocardium.  Normal resting perfusion.  2. Normal right ventricular size with RVEDVI 47 mL/m2.  Normal right ventricular thickness.  Normal right ventricular systolic function (RVEF =54%). There are no regional wall motion abnormalities or aneurysms.  3.  Normal right atrial size.  Dilated left atrium.  QP/QS 0.9.  4. Normal size of the aortic root, ascending aorta and pulmonary artery.  5. Valve assessment:  Aortic Valve: Tri-leaflet. Qualitatively, there is no significant regurgitation. Regurgitant fraction < 1%. Mean gradient 1.6 Mm Hg.  Pulmonic Valve: Qualitatively, there is no significant regurgitation. Regurgitant fraction < 1%.  Tricuspid Valve: Qualitatively, there is no significant regurgitation. Regurgitant fraction 2%.  Mitral Valve: Mild, central regurgitation. Regurgitant fraction 17%.  6.  Normal pericardium.  No pericardial effusion.  7. Grossly, no extracardiac findings.  Recommended dedicated study if concerned for non-cardiac pathology.  IMPRESSION: 1. Study is consistent with apical hypertrophic cardiomyopathy. Maximal thickness 16 mm. Small aneurysm formed. No LGE. Normal function.  2.  Normal RV function.  3.  Mild, central MR.  Stanly Leavens MD   Electronically Signed By: Stanly Leavens M.D. On: 11/26/2023 15:16   ______________________________________________________________________________________________      Physical Exam:    VS:  BP (!) 150/93 (BP Location: Right Arm)   Pulse 88   Ht 5' 2.5 (1.588 m)   Wt 163 lb (73.9 kg)   SpO2 97%   BMI 29.34 kg/m    Wt Readings from Last 3 Encounters:  02/16/24 163 lb (73.9 kg)  12/16/23 161 lb 9.6 oz (73.3 kg)  12/15/23 164 lb (74.4 kg)    Gen: No distress Neck: No JVD Cardiac: No Rubs or Gallops, holosystolic murmur, RRR +2 radial pulses Respiratory: Clear to auscultation bilaterally, normal effort, normal  respiratory rate GI: Soft, nontender, non-distended  MS: No edema;  moves all extremities Integument: Skin feels warm Neuro:  At time of evaluation, alert and oriented to person/place/time/situation  Psych: Normal affect, patient feels ok   ASSESSMENT AND PLAN: .    Hypertrophic Cardiomyopathy  - Apical Variant - apical thickness 14 with indexed measurement of 8 - without Apical Aneurysm - suspicion of Fabry's/Danon/Noonan's or other mimics of HCM: Low - Gene variant: Deferred - NYHA II - Biomarkers - pVO2: NA  - Non HCM Contributors to disease/status  SCD  Assessment - Exercise testing normal for sinus rhythm  no change in blood pressure or syncope on exercise testing) - Echo with no gradient - CMR from this year notable for small < 1.94 cm2 apical aneurysm -  2 year assessment for VT on rhythm monitor showed slow, WCT -  SCD risk estimated to be 1.77% at 5 years SDM: we have discussed that with presence of SCD, family member with ApHCM, and  aneurysm that I recommend a primary prevention defibrillator. 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 No evidence of atrial fibrillation or significant scar on the heart. Family history of sudden cardiac death, though the exact cause is unclear. Risk of sudden cardiac events is greater than 1.8% over the next five years. Discussed the option of an implantable defibrillator versus an implantable loop recorder. The defibrillator is considered the most aggressive option to prevent sudden cardiac events, while the loop recorder is a more conservative approach to monitor for arrhythmias. Risks and benefits of both options were discussed, including the potential for complications with the defibrillator and the ease of the loop recorder procedure. - Referred to electrophysiologist for discussion of implantable defibrillator or loop recorder. - Provided information on implantable defibrillator and loop recorder. - Deferred genetic testing as family members have been screened.  Hyperlipidemia DM HTN Elevated blood pressure readings, including 150/93 today. Blood pressure has fluctuated between high and low levels. Discussed the importance of blood pressure control to prevent progression of kidney disease. Losartan  is recommended as it is protective of kidney function and does not worsen heart function in non-obstructive cases. Conservative approach due to history of hypotension. - Started losartan  12.5 mg daily. - Will repeat BMP in a couple of weeks to monitor kidney function.  Family history reviewed, Discussed family screening  FHX of SCD (daughter) - genetic testing pending  March f/u with Tessa (Primary prevention)  Time Spent Directly with Patient:   I have spent a total of with the patient reviewing notes, imaging, EKGs, labs and examining the patient as well as establishing an assessment and plan that was discussed  personally with the patient. Discussed disease state education , using shared decision making tools and cardiac modeling , and SCD risk. Reviewed care and plan in collaboration with her husband and gave handouts.  Stanly Leavens, MD [facility] Cardiologist [facility]  [address] [place], [address] [place], [place] [address] [phone]  11:25 AM

## 2024-02-16 NOTE — Patient Instructions (Signed)
 Medication Instructions:  Your physician has recommended you make the following change in your medication:  START: losartan  12.5 mg by mouth once daily  *If you need a refill on your cardiac medications before your next appointment, please call your pharmacy*  Lab Work: IN 2 WEEKS at any [facility]: BMP  If you have labs (blood work) drawn today and your tests are completely normal, you will receive your results only by: [employer] (if you have MyChart) OR A paper copy in the mail If you have any lab test that is abnormal or we need to change your treatment, we will call you to review the results.  Testing/Procedures: Your physician has referred you to Electrophysiology (EP).    Follow-Up: At [facility], you and your health needs are our priority.  As part of our continuing mission to provide you with exceptional heart care, our providers are all part of one team.  This team includes your primary Cardiologist (physician) and Advanced Practice Providers or APPs (Physician Assistants and Nurse Practitioners) who all work together to provide you with the care you need, when you need it.  Your next appointment:   3-4 month(s)  Provider:   Stanly DELENA Leavens, MD or Orren Fabry, PA-C

## 2024-02-19 ENCOUNTER — Ambulatory Visit: Payer: Self-pay

## 2024-02-19 NOTE — Telephone Encounter (Signed)
 FYI Only or Action Required?: FYI only for provider: Advised UC.  Patient was last seen in primary care on 12/16/2023 by Jaycee Greig PARAS, NP.  Called Nurse Triage reporting Nasal Congestion.  Symptoms began several days ago.  Interventions attempted: OTC medications: Benadryl and tylenol  and cough drop.  Symptoms are: gradually worsening.  Triage Disposition: See HCP Within 4 Hours (Or PCP Triage)  Patient/caregiver understands and will follow disposition?: No, refuses disposition    Several days ago onset of sinus congestion, runny nose, burning in nostrils, left side of face hurts with some swelling and sinus headache. No cough or fever. Yellow drainage from nose. Reports some mild SOB. No audible SOB noted over the phone. Speaking in full clear continuous sentences. Reports hx of sinus infections with similar symptoms. No appts in pt region today, advised UC today. Declines going today, plans to go tomorrow morning.     Message from Victoria B sent at 02/19/2024 12:58 PM EST  Reason for Triage: Patient has burning in her nose from sinus infection she believes she has and runny nose with stuffiness also and sneezing   Reason for Disposition  [1] SEVERE sinus pain (e.g., excruciating) AND [2] not improved 2 hours after pain medicine  Answer Assessment - Initial Assessment Questions 1. LOCATION: Where does it hurt?      Left side of face  2. ONSET: When did the sinus pain start?  (e.g., hours, days)      A few days ago  3. SEVERITY: How bad is the pain?   (Scale 0-10; or none, mild, moderate or severe)     7-8/10  4. RECURRENT SYMPTOM: Have you ever had sinus problems before? If Yes, ask: When was the last time? and What happened that time?      Yes, had a sinus infection in the past with same symptoms need to take abxs.  5. NASAL CONGESTION: Is the nose blocked? If Yes, ask: Can you open it or must you breathe through your mouth?     No  6. NASAL DISCHARGE:  Do you have discharge from your nose? If so ask, What color?     Thick and yellow  7. FEVER: Do you have a fever? If Yes, ask: What is it, how was it measured, and when did it start?      Denies  8. OTHER SYMPTOMS: Do you have any other symptoms? (e.g., sore throat, cough, earache, difficulty breathing)      Sinus congestion, runny nose, burning in nostrils, left side of face hurts with some swelling and sinus headache  Protocols used: Sinus Pain or Congestion-A-AH

## 2024-02-21 ENCOUNTER — Other Ambulatory Visit: Payer: Self-pay | Admitting: Physician Assistant

## 2024-02-22 NOTE — Telephone Encounter (Signed)
 Noted

## 2024-02-22 NOTE — Telephone Encounter (Signed)
 I called patient with pcp recommendations and patient was upset because she stated she was not going to the Emergency room and wait 6 hours and urgent cart or us  did not have any appointments.  I gave patient information to the mobile fleeta that will be at [facility] at [facility] tomorrow and patient stated she will go there tomorrow

## 2024-02-22 NOTE — Telephone Encounter (Signed)
 Schedule appointment. During the interim report to the Emergency Department/Urgent Care/call 911 for immediate medical evaluation.

## 2024-02-24 ENCOUNTER — Telehealth: Payer: Self-pay | Admitting: Internal Medicine

## 2024-02-24 DIAGNOSIS — I471 Supraventricular tachycardia, unspecified: Secondary | ICD-10-CM

## 2024-02-24 DIAGNOSIS — I2510 Atherosclerotic heart disease of native coronary artery without angina pectoris: Secondary | ICD-10-CM

## 2024-02-24 DIAGNOSIS — I10 Essential (primary) hypertension: Secondary | ICD-10-CM

## 2024-02-24 DIAGNOSIS — I422 Other hypertrophic cardiomyopathy: Secondary | ICD-10-CM

## 2024-02-24 NOTE — Telephone Encounter (Signed)
 Called pt to inquire reason for referral.  Pt expresses would like a second opinion regarding need for defibrillator as well as for Cardiomyopathy.   Referral sent per pt request.

## 2024-02-24 NOTE — Telephone Encounter (Signed)
 Pt requesting referral to [facility] Dr. Kendra for second opinion. Please advise.

## 2024-02-26 ENCOUNTER — Telehealth: Payer: Self-pay | Admitting: Internal Medicine

## 2024-02-26 NOTE — Telephone Encounter (Signed)
 PT had labs done at the arthritis doctor and is wanting to know if those are the same labs that we are requesting. Please advise.

## 2024-02-26 NOTE — Telephone Encounter (Signed)
 Called patient she had lab work done at [facility] on 02/15/24, a CMET. Will send message to Dr. Santo, so he knows it was done.   Patient also wanted to say a thank you to Jackee Alberts, NP for caring and finding her HOCM.

## 2024-03-07 ENCOUNTER — Ambulatory Visit (INDEPENDENT_AMBULATORY_CARE_PROVIDER_SITE_OTHER): Admitting: Obstetrics and Gynecology

## 2024-03-07 ENCOUNTER — Encounter: Payer: Self-pay | Admitting: Obstetrics and Gynecology

## 2024-03-07 ENCOUNTER — Other Ambulatory Visit (HOSPITAL_COMMUNITY)
Admission: RE | Admit: 2024-03-07 | Discharge: 2024-03-07 | Disposition: A | Discharge status: Home / Self Care | Source: Ambulatory Visit | Attending: Obstetrics and Gynecology | Admitting: Obstetrics and Gynecology

## 2024-03-07 VITALS — BP 175/110 | HR 91 | Wt 161.0 lb

## 2024-03-07 DIAGNOSIS — N761 Subacute and chronic vaginitis: Secondary | ICD-10-CM | POA: Diagnosis present

## 2024-03-07 DIAGNOSIS — Z01419 Encounter for gynecological examination (general) (routine) without abnormal findings: Secondary | ICD-10-CM | POA: Diagnosis not present

## 2024-03-07 DIAGNOSIS — N952 Postmenopausal atrophic vaginitis: Secondary | ICD-10-CM | POA: Diagnosis not present

## 2024-03-07 MED ORDER — ESTRADIOL 0.01 % VA CREA: TOPICAL_CREAM | VAGINAL | 12 refills | Status: DC

## 2024-03-07 NOTE — Progress Notes (Signed)
 Subjective:     Courtney [name] is a 69 y.o. female postmenopausal and post hysterectomy and BMI 28 who is here for a comprehensive physical exam. The patient reports vaginal irritation and concerns for a yeast infection. Patient reports irritation has been present for the past few weeks without improvement following OTC treatment for yeast. Patient is not sexually active. She denies the presence of an odor or discharge. She reports vaginal pruritus and dryness. She denies any urinary incontinence.   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   Bowel obstruction (HCC)    Depression with anxiety 02/09/2015   mild xanax  prn   Diabetes mellitus (HCC) 07/18/2015   type 2   Diverticulitis    Dysrhythmia    abnormal ekgs worked up   Essential tremor 03/31/2016   Fibromyalgia    MS   Gait difficulty 02/09/2015   Gallstones    GERD (gastroesophageal reflux disease)    HLD (hyperlipidemia)    Hypersomnia 11/09/2015   Hypertension    no meds   Hyperthyroidism    patient states has no problems at present-01/25/2019   Hypothyroidism    patient states no problems at present 01/25/2019   IBS (irritable bowel syndrome)    Insomnia 02/09/2015   Migraines    MS (multiple sclerosis)    Multiple joint pain 03/31/2016   Numbness in both hands 03/18/2016   Other fatigue 02/09/2015   Peptic ulcer    Pneumonia    multiple times. Last time 2014   PONV (postoperative nausea and vomiting)    Sjogren syndrome    Urinary hesitancy 02/09/2015   Vision abnormalities    Past Surgical History:  Procedure Laterality Date   ABDOMINAL HYSTERECTOMY  1976   including oophorectomy, due to uterine cancer   APPENDECTOMY  1974   BIOPSY  01/31/2019   Procedure: BIOPSY;  Surgeon: Shila Gustav GAILS, MD;  Location: [facility];  Service: Endoscopy;;    CARDIAC CATHETERIZATION     2001/2006 [place]. Normal per patient   CARPAL TUNNEL RELEASE Left    carpal tunnel surgery   CHOLECYSTECTOMY     2006   COLON SURGERY     S[facility] x 2   COLONOSCOPY WITH PROPOFOL  N/A 01/31/2019   Procedure: COLONOSCOPY WITH PROPOFOL ;  Surgeon: Shila Gustav GAILS, MD;  Location: [facility];  Service: Endoscopy;  Laterality: N/A;   COLONOSCOPY WITH PROPOFOL  N/A 04/24/2022   Procedure: COLONOSCOPY WITH PROPOFOL ;  Surgeon: Shila Gustav GAILS, MD;  Location: [facility];  Service: Gastroenterology;  Laterality: N/A;   EUS N/A 04/09/2017   Procedure: UPPER ENDOSCOPIC ULTRASOUND (EUS) LINEAR;  Surgeon: Teressa Toribio SQUIBB, MD;  Location: [facility];  Service: Endoscopy;  Laterality: N/A;   HERNIA REPAIR     INCISIONAL HERNIA REPAIR N/A 05/05/2022   Procedure: LAPAROSCOPIC VENTRAL HERNA REPAIR WITH MESH;  Surgeon: Dasie Leonor CROME, MD;  Location: [facility] OR;  Service: General;  Laterality: N/A;   INSERTION OF MESH N/A 05/05/2022   Procedure: INSERTION OF MESH;  Surgeon: Dasie Leonor CROME, MD;  Location: [facility] OR;  Service: General;  Laterality: N/A;   MENISCUS REPAIR Left    POLYPECTOMY  01/31/2019   Procedure: POLYPECTOMY;  Surgeon: Shila Gustav GAILS, MD;  Location: [facility];  Service: Endoscopy;;   POLYPECTOMY  04/24/2022   Procedure: POLYPECTOMY;  Surgeon: Shila Gustav GAILS, MD;  Location: [facility];  Service: Gastroenterology;;   SMALL INTESTINE SURGERY     toenail removal Right 12/2017   great toenail    Family History  Problem Relation Age of Onset   Heart disease Mother    Kidney disease Mother    Diabetes Mother    Other Father 51       murdered   Multiple sclerosis Daughter    Multiple sclerosis Other    Diabetes Sister    Alcohol  abuse Brother        ETOH and marijuana   Diabetes Brother    Diabetes Sister    Diabetes Brother    ALS Brother     Social History   Socioeconomic History   Marital status: Married    Spouse name: Jimmie    Number of children: 4   Years of education: Not on file   Highest education level: GED or equivalent  Occupational History   Occupation: disabled  Tobacco Use   Smoking status: Former    Current packs/day: 0.00    Average packs/day: 0.3 packs/day for 5.0 years (1.3 ttl pk-yrs)    Types: Cigarettes    Start date: 61    Quit date: 1990    Years since quitting: 36.0    Passive exposure: Never   Smokeless tobacco: Never  Vaping Use   Vaping status: Never Used  Substance and Sexual Activity   Alcohol  use: Not Currently    Comment: rarely   Drug use: Yes    Types: Oxycodone    Sexual activity: Yes  Other Topics Concern   Not on file  Social History Narrative   Pt is right-handed. She lives with her husband in a 2 story house. Pt avoids caffeine. She and her husband go to the gym, lately once weekly, previously 3 times a week.    Social Drivers of Health   Tobacco Use: Medium Risk (03/01/2024)   Received from [facility]   Patient History    Smoking Tobacco Use: Former    Smokeless Tobacco Use: Never    Passive Exposure: Past  Physicist, Medical Strain: Low Risk (12/14/2023)   Overall Financial Resource Strain (CARDIA)    Difficulty of Paying Living Expenses: Not hard at all  Food Insecurity: No Food Insecurity (12/14/2023)   Epic    Worried About Programme Researcher, Broadcasting/film/video in the Last Year: Never true    Ran Out of Food in the Last Year: Never true  Transportation Needs: No Transportation Needs (12/14/2023)   Epic    Lack of Transportation (Medical): No    Lack of Transportation (Non-Medical): No  Physical Activity: Insufficiently Active (12/14/2023)   Exercise Vital Sign    Days of Exercise per Week: 2 days    Minutes of Exercise per Session: 20 min  Stress: Stress Concern Present (11/05/2023)   [employer] of Occupational Health - Occupational Stress Questionnaire    Feeling of Stress: To some extent  Social Connections: Moderately Integrated (12/14/2023)   Social  Connection and Isolation Panel    Frequency of Communication with Friends and Family: More than three times a week    Frequency of Social Gatherings with Friends and Family: Once a week    Attends Religious Services: More than 4 times per year    Active Member of [employer] or Organizations: No    Attends Engineer, Structural: Not on file    Marital Status: Married  Catering Manager Violence: Not At  Risk (12/16/2023)   Epic    Fear of Current or Ex-Partner: No    Emotionally Abused: No    Physically Abused: No    Sexually Abused: No  Depression (PHQ2-9): Low Risk (12/16/2023)   Depression (PHQ2-9)    PHQ-2 Score: 0  Alcohol  Screen: Low Risk (12/16/2023)   Alcohol  Screen    Last Alcohol  Screening Score (AUDIT): 0  Housing: High Risk (12/14/2023)   Epic    Unable to Pay for Housing in the Last Year: Yes    Number of Times Moved in the Last Year: Not on file    Homeless in the Last Year: No  Utilities: Not At Risk (08/31/2023)   Received from [facility] Utilities    Threatened with loss of utilities: No  Health Literacy: Adequate Health Literacy (12/16/2023)   B1300 Health Literacy    Frequency of need for help with medical instructions: Never   Health Maintenance  Topic Date Due   Medicare Annual Wellness (AWV)  09/23/2023   COVID-19 Vaccine (4 - 2025-26 season) 11/02/2023   Influenza Vaccine  05/31/2024 (Originally 10/02/2023)   Pneumococcal Vaccine: 50+ Years (3 of 3 - PCV) 11/04/2024 (Originally 05/04/2022)   HEMOGLOBIN A1C  05/04/2024   Diabetic kidney evaluation - Urine ACR  06/14/2024   FOOT EXAM  06/14/2024   Diabetic kidney evaluation - eGFR measurement  11/04/2024   OPHTHALMOLOGY EXAM  11/09/2024   Mammogram  12/10/2025   DTaP/Tdap/Td (2 - Td or Tdap) 11/20/2029   Colonoscopy  04/24/2032   Bone Density Scan  Completed   Hepatitis C Screening  Completed   Zoster Vaccines- Shingrix  Completed   Meningococcal B Vaccine  Aged Out       Review of  Systems Pertinent items noted in HPI and remainder of comprehensive ROS otherwise negative.   Objective:  Blood pressure (!) 170/100, pulse 91, weight 161 lb (73 kg).   GENERAL: Well-developed, well-nourished female in no acute distress.  HEENT: Normocephalic, atraumatic. Sclerae anicteric.  NECK: Supple. Normal thyroid .  LUNGS: Clear to auscultation bilaterally.  HEART: Regular rate and rhythm. BREASTS: declined ABDOMEN: Soft, nontender, nondistended. No organomegaly. PELVIC: Normal external female genitalia. Vagina is pale and atrophic.  Normal discharge. Vaginal vault intact. No adnexal mass or tenderness. Chaperone present during the pelvic exam EXTREMITIES: No cyanosis, clubbing, or edema, 2+ distal pulses.     Assessment:    Healthy female exam.      Plan:    Patient current on mammogram and colonoscopy Pap smear no longer indicated Vaginal swab collected to rule out BV or yeast Discussed possibility of atrophic vaginitis and benefits of estrogen vaginal cream. Patient agrees to await results of vaginal swab and if negative will start estrace .  Patient advised to take BP medication (which she did not take this morning) and to follow up with PCP. Patient scheduled to see Cardiologist on 03/09/2024 See After Visit Summary for Counseling Recommendations

## 2024-03-08 ENCOUNTER — Ambulatory Visit: Payer: Self-pay | Admitting: Obstetrics and Gynecology

## 2024-03-08 LAB — CERVICOVAGINAL ANCILLARY ONLY
Bacterial Vaginitis (gardnerella): NEGATIVE
Candida Glabrata: POSITIVE — AB
Candida Vaginitis: NEGATIVE
Comment: NEGATIVE
Comment: NEGATIVE
Comment: NEGATIVE

## 2024-03-08 MED ORDER — FLUCONAZOLE 150 MG PO TABS: 150.0000 mg | ORAL_TABLET | Freq: Once | ORAL | 3 refills | Status: DC

## 2024-03-08 NOTE — Progress Notes (Unsigned)
 " Electrophysiology Office Note:    Date:  03/09/2024   ID:  [name], DOB [birth year], MRN [id number]  PCP:  Jaycee Greig PARAS, NP   [facility] Providers Cardiologist:  Stanly DELENA Leavens, MD     Referring MD: Leavens Stanly DELENA, MD   History of Present Illness:    Courtney [name] is a 69 y.o. female with a medical history significant for hypertrophic cardiomyopathy, referred for possible ICD.     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  Discussed the use of AI scribe software for clinical note transcription with the patient, who gave verbal consent to proceed.  History of Present Illness Courtney [name] is a 69 year old female with apical hypertrophic cardiomyopathy who presents for discussion regarding defibrillator implantation. She was referred by Dr. Leavens for evaluation of her apical hypertrophic cardiomyopathy and consideration of defibrillator implantation.  She has apical hypertrophic cardiomyopathy with prior workup including MRI that showed a small aneurysm. She denies dizziness, syncope, or significant exercise intolerance.  She has recently diagnosed hypertension now treated with medication.  Her sister has a similar heart condition.  She has diabetes and is concerned about wound healing and infection risk with potential device implantation.         Today, she reports that she feels well and has no complaints.  EKGs/Labs/Other Studies Reviewed Today:     Echocardiogram:  TTE April 2025 Evidence of severe LV apical hypertrophy.  LVEF 70 to 75%.  Grade 1 diastolic dysfunction.  Left atrium mild to moderately dilated.   Monitors:  14 day monitor April 2025-- my interpretation Sinus rhythm, heart rate 44 to 130 bpm, average 68 bpm 1 6 beat episode of likely VT though the QRS complex is quite narrow, there appears to be VA conduction  Stress  testing:  Exercise stress test December 2025 Patient exercised for 7 minutes.  No HCM related symptoms  Advanced imaging:  Cardiac MRI September 2025 Consistent with apical hypertrophic cardiomyopathy.  No LGE.  Maximal thickness 16 mm.  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    EKG:   EKG Interpretation Date/Time:  Wednesday March 09 2024 10:27:58 EST Ventricular Rate:  91 PR Interval:  118 QRS Duration:  62 QT Interval:  366 QTC Calculation: 450 R Axis:   61  Text Interpretation: Normal sinus rhythm Biatrial enlargement Possible Anterior infarct (cited on or before 07-Sep-2023) Marked ST abnormality, possible inferior subendocardial injury When compared with ECG of 07-Sep-2023 13:57, QRS duration has decreased ST less depressed in Inferior leads ST less depressed in Anterolateral leads T wave inversion less evident in Anterior leads Confirmed by Nancey Scotts [phone]) on 03/09/2024 10:35:34 AM     Physical Exam:    VS:  BP (!) 149/83 (BP Location: Left Arm, Patient Position: Sitting, Cuff Size: Large)   Pulse 96   Resp 16   Ht 5' 2 (1.575 m)   Wt 158 lb 11.2 oz (72 kg)   SpO2 96%   BMI 29.03 kg/m     Wt Readings from Last 3 Encounters:  03/09/24 158 lb 11.2 oz (72 kg)  03/07/24 161 lb (73 kg)  02/16/24 163 lb (73.9 kg)     GEN: Well nourished, well developed in no acute distress CARDIAC: RRR, no murmurs, rubs, gallops RESPIRATORY:  Normal work of breathing MUSCULOSKELETAL: no edema    ASSESSMENT & PLAN:     Apical  hypertrophic cardiomyopathy We discussed the indication and rationale for defibrillator placement.  I discussed with her her approximately 2% annual risk of sudden cardiac death.  I explained the defibrillator placement procedure.  I think she would be a candidate for a subcutaneous ICD.  I explained risk of infection, discomfort, inappropriate shocks.  I mentioned that the alternative to defibrillator placement would be ongoing monitoring with a loop  recorder to look for progression a, nonsustainedsymptomatic of ventricular arrhythmia.  Also explained the loop recorder placement with risks of minor infection, minor bleeding.  I explained the monitoring process with the associated fee she  would like to consider her options and will give us  a call to schedule the procedure if she decides to move forward.    Signed, Eulas FORBES Furbish, MD  03/09/2024 11:02 AM    [facility] "

## 2024-03-09 ENCOUNTER — Other Ambulatory Visit: Payer: Self-pay

## 2024-03-09 ENCOUNTER — Ambulatory Visit: Attending: Cardiovascular Disease | Admitting: Cardiovascular Disease

## 2024-03-09 ENCOUNTER — Encounter: Payer: Self-pay | Admitting: Cardiovascular Disease

## 2024-03-09 VITALS — BP 149/83 | HR 96 | Resp 16 | Ht 62.0 in | Wt 158.7 lb

## 2024-03-09 DIAGNOSIS — I471 Supraventricular tachycardia, unspecified: Secondary | ICD-10-CM | POA: Diagnosis present

## 2024-03-09 DIAGNOSIS — I2510 Atherosclerotic heart disease of native coronary artery without angina pectoris: Secondary | ICD-10-CM | POA: Diagnosis present

## 2024-03-09 DIAGNOSIS — I10 Essential (primary) hypertension: Secondary | ICD-10-CM | POA: Diagnosis present

## 2024-03-09 MED ORDER — FLUCONAZOLE 150 MG PO TABS: 150.0000 mg | ORAL_TABLET | Freq: Once | ORAL | 3 refills | Status: AC

## 2024-03-09 NOTE — Patient Instructions (Signed)
 Medication Instructions:  Your physician recommends that you continue on your current medications as directed. Please refer to the Current Medication list given to you today.  *If you need a refill on your cardiac medications before your next appointment, please call your pharmacy*  Lab Work: None ordered.  If you have labs (blood work) drawn today and your tests are completely normal, you will receive your results only by: MyChart Message (if you have MyChart) OR A paper copy in the mail If you have any lab test that is abnormal or we need to change your treatment, we will call you to review the results.  Testing/Procedures: None ordered.   Follow-Up: At [facility], you and your health needs are our priority.  As part of our continuing mission to provide you with exceptional heart care, our providers are all part of one team.  This team includes your primary Cardiologist (physician) and Advanced Practice Providers or APPs (Physician Assistants and Nurse Practitioners) who all work together to provide you with the care you need, when you need it.  Your next appointment:   Follow up as needed with Dr Nancey  We recommend signing up for the patient portal called MyChart.  Sign up information is provided on this After Visit Summary.  MyChart is used to connect with patients for Virtual Visits (Telemedicine).  Patients are able to view lab/test results, encounter notes, upcoming appointments, etc.  Non-urgent messages can be sent to your provider as well.   To learn more about what you can do with MyChart, go to [email or web address].   Other Instructions Please call us  and let us  know if you decide to proceed with ICD or Loop Implant at [phone]

## 2024-03-16 ENCOUNTER — Encounter: Attending: Internal Medicine | Admitting: Nutrition

## 2024-03-16 DIAGNOSIS — Z794 Long term (current) use of insulin: Secondary | ICD-10-CM | POA: Insufficient documentation

## 2024-03-16 DIAGNOSIS — E119 Type 2 diabetes mellitus without complications: Secondary | ICD-10-CM | POA: Diagnosis present

## 2024-03-17 NOTE — Progress Notes (Signed)
 Patient is here today because she is needing new site placements for pod because abdomen has much scar tissue and pods are not lasting 3 days due to canula issues. Patient's abdomen has much scar tissue and so, recommend upper arms, and upper buttocks areas, as well as side lower buttocks areas.  Suggested she get lotions and massage scar areas on abdomen for 20-30 minutes to attempt to break down scar tissue-but told her that this will take several months to work.  She agreed to try upper arms with canula facing down, and sensor placed 4 inches above canula placement and toward the back of the arm. She will call if this does not work for her.

## 2024-03-17 NOTE — Patient Instructions (Addendum)
 Try upper arm and buttocks areas, as well as upper sides of legs and lower sides of buttock for pod placement Make sure that sensor is at least 4 inches from canula placement. Massage abdominal areas with lotion for 20 minutes each night

## 2024-03-18 ENCOUNTER — Ambulatory Visit: Admitting: Family

## 2024-03-21 ENCOUNTER — Encounter: Payer: Self-pay | Admitting: Internal Medicine

## 2024-03-21 ENCOUNTER — Ambulatory Visit: Payer: Self-pay

## 2024-03-25 NOTE — Telephone Encounter (Signed)
 Called pt advised of MD recommendation per note 03/22/24: HTN- we discussed her CKD from the past and that I was unable to access the labs that were not done at our institution. I asked my team to get those labs, but they were unable to do so. Her blood pressure is still elevated less pressure was 146 systolic. I have recommended increasing to losartan  25 mg PO daily and a BMP that should come back to me in two weeks. WCT- she notes that shes had issues getting her medications that include this [facility] was [facility]. I recommend that the losartan  we have discussed above and metoprolol  succinate 25 mg PO daily be sent to request  Pt expresses understanding.  Pt given office fax number to send over lab results from [facility]. Pt also expresses has questions regarding ICD implant has questions that she would like answered advised will send to Dr. Marko nurse to f/u.

## 2024-03-25 NOTE — Telephone Encounter (Signed)
 Spoke with the patient in regards to S-ICD that Dr. Mealor recommended. Sent MyChart message with further information.  She wants to talk with some other friends and family before scheduling.

## 2024-03-31 ENCOUNTER — Other Ambulatory Visit: Payer: Self-pay | Admitting: Obstetrics and Gynecology

## 2024-03-31 MED ORDER — FLUCONAZOLE 150 MG PO TABS: 150.0000 mg | ORAL_TABLET | Freq: Once | ORAL | 0 refills | Status: AC

## 2024-03-31 MED ORDER — ESTRADIOL 0.01 % VA CREA: TOPICAL_CREAM | VAGINAL | 12 refills | Status: AC

## 2024-04-04 ENCOUNTER — Encounter: Payer: Self-pay | Admitting: Nurse Practitioner

## 2024-04-06 ENCOUNTER — Ambulatory Visit: Payer: Self-pay

## 2024-04-06 ENCOUNTER — Encounter: Admitting: Nurse Practitioner

## 2024-04-08 MED ORDER — LOSARTAN POTASSIUM 25 MG PO TABS: 25.0000 mg | ORAL_TABLET | Freq: Every day | ORAL | 2 refills | Status: AC

## 2024-04-08 NOTE — Telephone Encounter (Signed)
 Called pt advised to continue taking both Losartan  25 mg and metoprolol  25 mg.  I have sent in an updated prescription for losartan  to pt pharmacy.  Pt will monitor BP and send in a log for MD to review.  Pt reports BP ranges from 140-150's/high 90's.  All questions answered.

## 2024-04-08 NOTE — Telephone Encounter (Signed)
 Pt pretty sure she wants to proceed with SICD, but wants to wait and get her BP under control before final decision.  She will call office back when she is ready to schedule.  Reports BP today was 147/97, and last night it was pretty much normal. She is asking if she should take both Losartan  & Metoprolol  going forward. Informed that w/ reported number I believe she would but will send to provider for confirmation. If she is remaining on Losartan , she needs a new Rx sent in as she is taking a whole tablet now and has been for the past month.  Aware will forward to general cardiology team to address this with her.

## 2024-04-08 NOTE — Addendum Note (Signed)
 Addended by: RANDY HAMP SAILOR on: 04/08/2024 05:41 PM   Modules accepted: Orders

## 2024-05-26 ENCOUNTER — Ambulatory Visit: Admitting: Physician Assistant

## 2024-06-17 ENCOUNTER — Ambulatory Visit: Admitting: Internal Medicine
